# Patient Record
Sex: Female | Born: 1950 | Race: White | Hispanic: No | Smoking: Never smoker
Health system: Southern US, Community
[De-identification: ages and names within clinical notes are randomized; demographics above are authoritative.]

## PROBLEM LIST (undated history)

## (undated) DIAGNOSIS — I10 Essential (primary) hypertension: Secondary | ICD-10-CM

## (undated) DIAGNOSIS — Z87898 Personal history of other specified conditions: Secondary | ICD-10-CM

## (undated) DIAGNOSIS — C482 Malignant neoplasm of peritoneum, unspecified: Secondary | ICD-10-CM

## (undated) DIAGNOSIS — G4730 Sleep apnea, unspecified: Secondary | ICD-10-CM

## (undated) DIAGNOSIS — B9629 Other Escherichia coli [E. coli] as the cause of diseases classified elsewhere: Secondary | ICD-10-CM

## (undated) DIAGNOSIS — M1990 Unspecified osteoarthritis, unspecified site: Secondary | ICD-10-CM

## (undated) DIAGNOSIS — M5090 Cervical disc disorder, unspecified, unspecified cervical region: Secondary | ICD-10-CM

## (undated) DIAGNOSIS — K219 Gastro-esophageal reflux disease without esophagitis: Secondary | ICD-10-CM

## (undated) DIAGNOSIS — K449 Diaphragmatic hernia without obstruction or gangrene: Secondary | ICD-10-CM

## (undated) DIAGNOSIS — E7800 Pure hypercholesterolemia, unspecified: Secondary | ICD-10-CM

## (undated) DIAGNOSIS — C801 Malignant (primary) neoplasm, unspecified: Secondary | ICD-10-CM

## (undated) DIAGNOSIS — K760 Fatty (change of) liver, not elsewhere classified: Secondary | ICD-10-CM

## (undated) DIAGNOSIS — J45909 Unspecified asthma, uncomplicated: Secondary | ICD-10-CM

## (undated) DIAGNOSIS — M519 Unspecified thoracic, thoracolumbar and lumbosacral intervertebral disc disorder: Secondary | ICD-10-CM

## (undated) DIAGNOSIS — N390 Urinary tract infection, site not specified: Secondary | ICD-10-CM

## (undated) DIAGNOSIS — J189 Pneumonia, unspecified organism: Secondary | ICD-10-CM

## (undated) HISTORY — DX: Sleep apnea, unspecified: G47.30

## (undated) HISTORY — PX: BLADDER REPAIR: SHX76

## (undated) HISTORY — DX: Gastro-esophageal reflux disease without esophagitis: K21.9

## (undated) HISTORY — DX: Unspecified asthma, uncomplicated: J45.909

## (undated) HISTORY — PX: CARPAL TUNNEL RELEASE: SHX101

## (undated) HISTORY — DX: Other Escherichia coli (E. coli) as the cause of diseases classified elsewhere: B96.29

## (undated) HISTORY — PX: TOE SURGERY: SHX1073

## (undated) HISTORY — DX: Other Escherichia coli (E. coli) as the cause of diseases classified elsewhere: N39.0

## (undated) HISTORY — PX: REPAIR RECTOCELE: SUR1206

## (undated) HISTORY — PX: BREAST EXCISIONAL BIOPSY: SUR124

---

## 2012-06-21 HISTORY — PX: CATARACT EXTRACTION, BILATERAL: SHX1313

## 2014-07-30 ENCOUNTER — Other Ambulatory Visit: Payer: Self-pay

## 2014-07-30 DIAGNOSIS — Z1231 Encounter for screening mammogram for malignant neoplasm of breast: Secondary | ICD-10-CM

## 2014-07-30 DIAGNOSIS — Z803 Family history of malignant neoplasm of breast: Secondary | ICD-10-CM

## 2014-08-16 ENCOUNTER — Other Ambulatory Visit: Payer: Self-pay | Admitting: Family Medicine

## 2014-08-16 DIAGNOSIS — R200 Anesthesia of skin: Secondary | ICD-10-CM

## 2014-08-16 DIAGNOSIS — M542 Cervicalgia: Secondary | ICD-10-CM

## 2014-09-18 ENCOUNTER — Ambulatory Visit
Admission: RE | Admit: 2014-09-18 | Discharge: 2014-09-18 | Disposition: A | Discharge status: Home / Self Care | Payer: BLUE CROSS/BLUE SHIELD | Source: Ambulatory Visit | Attending: Family Medicine | Admitting: Family Medicine

## 2014-09-18 DIAGNOSIS — R200 Anesthesia of skin: Secondary | ICD-10-CM

## 2014-09-18 DIAGNOSIS — M542 Cervicalgia: Secondary | ICD-10-CM

## 2014-09-20 ENCOUNTER — Ambulatory Visit
Admission: RE | Admit: 2014-09-20 | Discharge: 2014-09-20 | Disposition: A | Discharge status: Home / Self Care | Payer: BLUE CROSS/BLUE SHIELD | Source: Ambulatory Visit | Attending: Family Medicine | Admitting: Family Medicine

## 2014-10-31 ENCOUNTER — Emergency Department (HOSPITAL_COMMUNITY): Payer: BLUE CROSS/BLUE SHIELD

## 2014-10-31 ENCOUNTER — Emergency Department (HOSPITAL_COMMUNITY)
Admission: EM | Admit: 2014-10-31 | Discharge: 2014-10-31 | Disposition: A | Discharge status: Home / Self Care | Payer: BLUE CROSS/BLUE SHIELD | Attending: Emergency Medicine | Admitting: Emergency Medicine

## 2014-10-31 ENCOUNTER — Encounter (HOSPITAL_COMMUNITY): Payer: Self-pay

## 2014-10-31 DIAGNOSIS — I10 Essential (primary) hypertension: Secondary | ICD-10-CM | POA: Insufficient documentation

## 2014-10-31 DIAGNOSIS — Z9981 Dependence on supplemental oxygen: Secondary | ICD-10-CM | POA: Insufficient documentation

## 2014-10-31 DIAGNOSIS — Z79899 Other long term (current) drug therapy: Secondary | ICD-10-CM | POA: Diagnosis not present

## 2014-10-31 DIAGNOSIS — Z7982 Long term (current) use of aspirin: Secondary | ICD-10-CM | POA: Insufficient documentation

## 2014-10-31 DIAGNOSIS — M549 Dorsalgia, unspecified: Secondary | ICD-10-CM | POA: Diagnosis not present

## 2014-10-31 DIAGNOSIS — R509 Fever, unspecified: Secondary | ICD-10-CM | POA: Insufficient documentation

## 2014-10-31 DIAGNOSIS — Z791 Long term (current) use of non-steroidal anti-inflammatories (NSAID): Secondary | ICD-10-CM | POA: Diagnosis not present

## 2014-10-31 DIAGNOSIS — R109 Unspecified abdominal pain: Secondary | ICD-10-CM

## 2014-10-31 DIAGNOSIS — E780 Pure hypercholesterolemia: Secondary | ICD-10-CM | POA: Insufficient documentation

## 2014-10-31 HISTORY — DX: Pure hypercholesterolemia, unspecified: E78.00

## 2014-10-31 HISTORY — DX: Essential (primary) hypertension: I10

## 2014-10-31 LAB — COMPREHENSIVE METABOLIC PANEL
ALT: 31 U/L (ref 14–54)
AST: 36 U/L (ref 15–41)
Albumin: 3.9 g/dL (ref 3.5–5.0)
Alkaline Phosphatase: 65 U/L (ref 38–126)
Anion gap: 10 (ref 5–15)
BUN: 11 mg/dL (ref 6–20)
CO2: 29 mmol/L (ref 22–32)
Calcium: 8.8 mg/dL — ABNORMAL LOW (ref 8.9–10.3)
Chloride: 97 mmol/L — ABNORMAL LOW (ref 101–111)
Creatinine, Ser: 0.84 mg/dL (ref 0.44–1.00)
GFR calc Af Amer: >60 mL/min (ref >60)
GFR calc non Af Amer: >60 mL/min (ref >60)
Glucose, Bld: 123 mg/dL — ABNORMAL HIGH (ref 65–99)
Potassium: 3.9 mmol/L (ref 3.5–5.1)
Sodium: 136 mmol/L (ref 135–145)
Total Bilirubin: 1 mg/dL (ref 0.3–1.2)
Total Protein: 7.5 g/dL (ref 6.5–8.1)

## 2014-10-31 LAB — URINALYSIS, ROUTINE W REFLEX MICROSCOPIC
Bilirubin Urine: NEGATIVE
Glucose, UA: NEGATIVE mg/dL
Ketones, ur: 15 mg/dL — AB
Nitrite: NEGATIVE
Protein, ur: NEGATIVE mg/dL
Specific Gravity, Urine: 1.021 (ref 1.005–1.030)
Urobilinogen, UA: 1 mg/dL (ref 0.0–1.0)
pH: 7 (ref 5.0–8.0)

## 2014-10-31 LAB — CBC WITH DIFFERENTIAL/PLATELET
Basophils Absolute: 0 10*3/uL (ref 0.0–0.1)
Basophils Relative: 0 % (ref 0–1)
Eosinophils Absolute: 0.3 10*3/uL (ref 0.0–0.7)
Eosinophils Relative: 3 % (ref 0–5)
HCT: 42.9 % (ref 36.0–46.0)
Hemoglobin: 14 g/dL (ref 12.0–15.0)
Lymphocytes Relative: 4 % — ABNORMAL LOW (ref 12–46)
Lymphs Abs: 0.5 10*3/uL — ABNORMAL LOW (ref 0.7–4.0)
MCH: 29.4 pg (ref 26.0–34.0)
MCHC: 32.6 g/dL (ref 30.0–36.0)
MCV: 90.1 fL (ref 78.0–100.0)
Monocytes Absolute: 0.8 10*3/uL (ref 0.1–1.0)
Monocytes Relative: 7 % (ref 3–12)
Neutro Abs: 10.4 10*3/uL — ABNORMAL HIGH (ref 1.7–7.7)
Neutrophils Relative %: 86 % — ABNORMAL HIGH (ref 43–77)
Platelets: 206 10*3/uL (ref 150–400)
RBC: 4.76 MIL/uL (ref 3.87–5.11)
RDW: 13.6 % (ref 11.5–15.5)
WBC: 12.1 10*3/uL — ABNORMAL HIGH (ref 4.0–10.5)

## 2014-10-31 LAB — URINE MICROSCOPIC-ADD ON

## 2014-10-31 LAB — GRAM STAIN

## 2014-10-31 LAB — I-STAT CG4 LACTIC ACID, ED: Lactic Acid, Venous: 1.83 mmol/L (ref 0.5–2.0)

## 2014-10-31 MED ORDER — CEPHALEXIN 500 MG PO CAPS: 500.0000 mg | ORAL_CAPSULE | Freq: Four times a day (QID) | ORAL | Status: AC

## 2014-10-31 MED ORDER — ONDANSETRON HCL 4 MG PO TABS: 4.0000 mg | ORAL_TABLET | Freq: Four times a day (QID) | ORAL | Status: DC

## 2014-10-31 MED ORDER — SODIUM CHLORIDE 0.9 % IV BOLUS (SEPSIS)
1000.0000 mL | Freq: Once | INTRAVENOUS | Status: AC
  Administered 2014-10-31: 1000 mL via INTRAVENOUS

## 2014-10-31 MED ORDER — ONDANSETRON HCL 4 MG/2ML IJ SOLN
4.0000 mg | Freq: Once | INTRAMUSCULAR | Status: AC
  Administered 2014-10-31: 4 mg via INTRAVENOUS
  Filled 2014-10-31: qty 2

## 2014-10-31 MED ORDER — ACETAMINOPHEN 325 MG PO TABS
650.0000 mg | ORAL_TABLET | Freq: Four times a day (QID) | ORAL | Status: DC | PRN
  Administered 2014-10-31: 650 mg via ORAL

## 2014-10-31 MED ORDER — ACETAMINOPHEN 325 MG PO TABS
ORAL_TABLET | ORAL | Status: AC
  Filled 2014-10-31: qty 2

## 2014-10-31 MED ORDER — HYDROCODONE-ACETAMINOPHEN 5-325 MG PO TABS: 2.0000 | ORAL_TABLET | ORAL | Status: DC | PRN

## 2014-10-31 NOTE — ED Notes (Signed)
Pt reports she was diagnosed with uti last Thursday and had cortisone injections in her neck a week from Tuesday. Today she went to physical therapy and was found to have a fever so they sent her home. PCP did urinalysis today at pcp and was told urine looked better. Pt c/o pain above her cortisone injections site and pain mid back as well. Denies painful urination or blood in urine that she has noticed. Pt alert, oriented, nad.

## 2014-10-31 NOTE — ED Notes (Signed)
Spoke with xray, they will bring pt to room once they are completed with xray.

## 2014-10-31 NOTE — ED Notes (Signed)
Pt sent from [facility] MD for further eval to get ct scan. Has been on antibiotics for a week with macrobid and ecoli in urine. Having upper back pain. MD wants evaluated for spinal abscess vs pyelo.

## 2014-10-31 NOTE — ED Notes (Signed)
Patient transported to CT 

## 2014-10-31 NOTE — ED Provider Notes (Signed)
CSN: [id number]     Arrival date & time 10/31/14  1517 History   First MD Initiated Contact with Patient 10/31/14 1610     Chief Complaint  Patient presents with  . Fever  . Urinary Tract Infection     (Consider location/radiation/quality/duration/timing/severity/associated sxs/prior Treatment) HPI Comments: 64 year old female presents from PCP office with concern for fever. Patient was recently diagnosed with UTI approximately 1 week ago. He has been on oral Macrobid for 1 week. Had been improving until today. Today went to physical therapy where she was noted to have fever up to 101. Went home and rested however fever continued to climb now 102. Patient complaining of bilateral CVA pain. Patient also recently had a spinal injection at approximately T2. This was approximately one week ago. She is also complaining of some increased pain in that area as well.  Patient is a 64 y.o. female presenting with fever and urinary tract infection.  Fever Max temp prior to arrival:  102 Temp source:  Oral Severity:  Moderate Onset quality:  Gradual Timing:  Constant Progression:  Worsening Chronicity:  New Associated symptoms: no chest pain, no confusion, no congestion, no headaches and no rash   Associated symptoms comment:  Back pain Risk factors: recent surgery   Urinary Tract Infection Associated symptoms include a fever. Pertinent negatives include no abdominal pain, chest pain, congestion, headaches or rash.    Past Medical History  Diagnosis Date  . Hypertension   . Hypercholesteremia    Past Surgical History  Procedure Laterality Date  . Carpal tunnel release Right   . Repair rectocele    . Bladder repair    . Cataract extraction, bilateral  2014  . Breast lumpectomy Left    No family history on file. History  Substance Use Topics  . Smoking status: Never Smoker   . Smokeless tobacco: Not on file  . Alcohol Use: No   OB History    No data available     Review of  Systems  Constitutional: Positive for fever.  HENT: Negative for congestion.   Respiratory: Negative for shortness of breath.   Cardiovascular: Negative for chest pain.  Gastrointestinal: Negative for abdominal pain.  Genitourinary: Positive for flank pain.  Musculoskeletal: Positive for back pain.  Skin: Negative for rash.  Neurological: Negative for headaches.  Psychiatric/Behavioral: Negative for confusion.  All other systems reviewed and are negative.     Allergies  Codeine  Home Medications   Prior to Admission medications   Medication Sig Start Date End Date Taking? Authorizing Provider  aspirin EC 81 MG tablet Take 81 mg by mouth daily.   Yes Historical Provider, MD  Biotin 5000 MCG TABS Take 5,000 mcg by mouth daily.   Yes Historical Provider, MD  Coenzyme Q10 (CO Q 10 PO) Take 1 tablet by mouth daily.   Yes Historical Provider, MD  ferrous sulfate 325 (65 FE) MG tablet Take 325 mg by mouth daily with breakfast.   Yes Historical Provider, MD  Flaxseed, Linseed, (FLAX SEED OIL) 1000 MG CAPS Take 1,000 mg by mouth daily.   Yes Historical Provider, MD  lisinopril-hydrochlorothiazide (PRINZIDE,ZESTORETIC) 20-25 MG per tablet Take 1 tablet by mouth daily.   Yes Historical Provider, MD  lovastatin (MEVACOR) 20 MG tablet Take 20 mg by mouth at bedtime.   Yes Historical Provider, MD  meloxicam (MOBIC) 15 MG tablet Take 15 mg by mouth daily.   Yes Historical Provider, MD  methocarbamol (ROBAXIN) 500 MG tablet Take 500 mg  by mouth every 8 (eight) hours as needed for muscle spasms.   Yes Historical Provider, MD  Multiple Vitamins-Minerals (MULTIVITAMIN WITH MINERALS) tablet Take 1 tablet by mouth daily.   Yes Historical Provider, MD  nitrofurantoin, macrocrystal-monohydrate, (MACROBID) 100 MG capsule Take 100 mg by mouth 2 (two) times daily.   Yes Historical Provider, MD  Omega-3 Fatty Acids (FISH OIL) 1000 MG CAPS Take 1,000 mg by mouth daily.   Yes Historical Provider, MD  vitamin E  400 UNIT capsule Take 400 Units by mouth daily.   Yes Historical Provider, MD  cholecalciferol (VITAMIN D) 1000 UNITS tablet Take 1,000 Units by mouth daily.    Historical Provider, MD   BP 120/50 mmHg  Pulse 88  Temp(Src) 100.2 F (37.9 C) (Oral)  Resp 23  Ht 5\' 5"  (1.651 m)  Wt 259 lb 12.8 oz (117.845 kg)  BMI 43.23 kg/m2  SpO2 97% Physical Exam  Constitutional: She is oriented to person, place, and time. She appears well-developed.  HENT:  Head: Normocephalic.  Eyes: Pupils are equal, round, and reactive to light.  Neck: Normal range of motion.  Mild tenderness to palpation approximately level of T1-T2. No erythema areas of fluctuance etc.  Cardiovascular: Normal rate and intact distal pulses.   Pulmonary/Chest: Effort normal. No respiratory distress.  Abdominal: She exhibits no distension.  Genitourinary:  Denies any bowel or bladder incontinence. Deferred exam at this time  Musculoskeletal:  Pain and bilateral CVAs as well as midline  Neurological: She is alert and oriented to person, place, and time. She exhibits normal muscle tone.  Skin: Skin is warm. No rash noted. No erythema.  Psychiatric: She has a normal mood and affect.  Vitals reviewed.   ED Course  Procedures (including critical care time) Labs Review Labs Reviewed  CBC WITH DIFFERENTIAL/PLATELET - Abnormal; Notable for the following:    WBC 12.1 (*)    Neutrophils Relative % 86 (*)    Neutro Abs 10.4 (*)    Lymphocytes Relative 4 (*)    Lymphs Abs 0.5 (*)    All other components within normal limits  COMPREHENSIVE METABOLIC PANEL - Abnormal; Notable for the following:    Chloride 97 (*)    Glucose, Bld 123 (*)    Calcium 8.8 (*)    All other components within normal limits  URINALYSIS, ROUTINE W REFLEX MICROSCOPIC - Abnormal; Notable for the following:    Color, Urine AMBER (*)    Hgb urine dipstick MODERATE (*)    Ketones, ur 15 (*)    Leukocytes, UA TRACE (*)    All other components within  normal limits  URINE MICROSCOPIC-ADD ON - Abnormal; Notable for the following:    Squamous Epithelial / LPF FEW (*)    All other components within normal limits  GRAM STAIN  CULTURE, BLOOD (ROUTINE X 2)  CULTURE, BLOOD (ROUTINE X 2)  URINE CULTURE  I-STAT CG4 LACTIC ACID, ED    Imaging Review Dg Chest 2 View  10/31/2014   CLINICAL DATA:  Fever and cough  EXAM: CHEST  2 VIEW  COMPARISON:  None.  FINDINGS: Heart size and vascularity are normal. Lung markings are prominent. These appear chronic however no prior studies available to confirm. Negative for pneumonia or effusion. No mass lesion.  IMPRESSION: No active cardiopulmonary disease.   Electronically Signed   By: Franchot Gallo M.D.   On: 10/31/2014 16:09   Ct Renal Stone Study  10/31/2014   CLINICAL DATA:  Upper back pain for  1 week. On antibiotics for urinary tract infection. Evaluate for spinal abscess versus pyelonephritis. Initial encounter.  EXAM: CT ABDOMEN AND PELVIS WITHOUT CONTRAST  TECHNIQUE: Multidetector CT imaging of the abdomen and pelvis was performed following the standard protocol without IV contrast.  COMPARISON:  Chest radiograph same date.  FINDINGS: Lower chest: Patchy right middle lobe airspace disease on images 6-8 without consolidation. The lung bases are otherwise clear. There is no significant pleural or pericardial effusion.  Hepatobiliary: The liver demonstrates decreased density consistent with steatosis. As evaluated in the noncontrast state, no focal abnormality identified. No evidence of gallstones, gallbladder wall thickening or biliary dilatation.  Pancreas: Unremarkable. No pancreatic ductal dilatation or surrounding inflammatory changes.  Spleen: Normal in size without focal abnormality.  Adrenals/Urinary Tract: Both adrenal glands appear normal.The kidneys appear normal without evidence of urinary tract calculus, suspicious lesion or hydronephrosis. There is no perinephric soft tissue stranding or fluid  collection. There are no signs of pyelonephritis on noncontrast imaging. No bladder abnormalities are seen.  Stomach/Bowel: No evidence of bowel wall thickening, distention or surrounding inflammatory change.The appendix appears normal. There is diverticulosis of the descending and sigmoid colon.  Vascular/Lymphatic: There are no enlarged abdominal or pelvic lymph nodes. Minimal atherosclerosis. No significant vascular findings demonstrated on noncontrast imaging.  Reproductive: Unremarkable.  Other: No evidence of abdominal wall mass or hernia.  Musculoskeletal: No acute or significant osseous findings. There is a mild convex right lumbar scoliosis with associated spondylosis. There is no evidence of discitis or paraspinal inflammation on noncontrast imaging. Mild osteitis pubis noted.  IMPRESSION: 1. No acute abdominal pelvic findings or explanation for the patient's symptoms. 2. No evidence of complicated urinary tract infection or paraspinal inflammation on noncontrast imaging. 3. Distal colonic diverticulosis without surrounding inflammation. 4. Hepatic steatosis. 5. Patchy right middle lobe airspace disease without consolidation. This could reflect minimal inflammation, not apparent on today's radiographs.   Electronically Signed   By: Richardean Sale M.D.   On: 10/31/2014 18:40     EKG Interpretation None      MDM  64 year old female presents from PCP clinic with fever. Patient is been treated for probably 5 days with Macrobid for UTI. Today along with fevers having worsening pain mainly over the CVA areas. On exam she is markedly tender palpation along these areas. Additionally she is febrile here to 102.2. Concerning this patient for possible untreated UTI versus pneumonia versus cellulitis. UA here has only trace leukocytes otherwise appears fairly clean. Again she has been on Macrobid for 5 days at his possible this is masking a partial untreated UTI. However given patient's amount of pain we  will obtain a CT abdomen and pelvis to rule out nephrolithiasis.. CT showed no findings for pain. Patient has no other infectious etiologies time. Chest x-ray is clear. She is satting well on room air initially. She did have episodes of desaturation in the emergency department however this was when she was sleeping. She does wear CPAP every night. Given chest x-ray thinks is a pneumonia. No skin findings consistent with cellulitis. Patient did have a swelling injection prostatectomy 10 days ago. This was level CII. On exam she has minimal tenderness palpations area. There is no overlying erythema crepitus or other findings consistent with a underlying infection. Additionally this appears to be out of the timeframe for development of infection. She has no neurological symptoms which indicate a spinal abscess. This time patient has a fever of unclear etiology. Possibly developing pyelonephritis. May also be a viral  infection. Plan will be to treat with Keflex for 10 days for pyelonephritis. Discontinue Macrobid. Follow-up with PCP in several days. We have discussed return precautions for development of any neurological symptoms, bowel or bladder incontinence which would indicate some spinal pathology. However at this time we do not think an MRI of the spine is indicated. Discussed this with the patient who is in full agreement with this plan. Also total patient returned department if she has any worsening of any symptoms.   Final diagnoses:  Flank pain        Robynn Pane, MD [id number]  Delora Fuel, MD 47/42/59 [id number]

## 2014-10-31 NOTE — ED Notes (Signed)
MD at bedside. 

## 2014-10-31 NOTE — ED Notes (Addendum)
Dr. Olene Floss advised patient was placed on 2L O2 because her sat dropped to 90% while sleeping. O2 Sat back to 96% at this time. Also talked with Dr. Olene Floss about concerns that patient's status did not seem to have improved since her arrival here.

## 2014-10-31 NOTE — ED Notes (Signed)
Pt ambulated to bathroom with no assistance.  

## 2014-11-01 LAB — URINE CULTURE
Colony Count: NO GROWTH
Culture: NO GROWTH

## 2014-11-06 LAB — CULTURE, BLOOD (ROUTINE X 2): Culture: NO GROWTH

## 2014-11-07 LAB — CULTURE, BLOOD (ROUTINE X 2): Culture: NO GROWTH

## 2014-12-17 ENCOUNTER — Other Ambulatory Visit: Payer: Self-pay | Admitting: Family Medicine

## 2014-12-17 DIAGNOSIS — Z853 Personal history of malignant neoplasm of breast: Secondary | ICD-10-CM

## 2014-12-31 ENCOUNTER — Ambulatory Visit
Admission: RE | Admit: 2014-12-31 | Discharge: 2014-12-31 | Disposition: A | Discharge status: Home / Self Care | Payer: BLUE CROSS/BLUE SHIELD | Source: Ambulatory Visit | Attending: Family Medicine | Admitting: Family Medicine

## 2014-12-31 DIAGNOSIS — Z853 Personal history of malignant neoplasm of breast: Secondary | ICD-10-CM

## 2015-08-19 ENCOUNTER — Encounter (INDEPENDENT_AMBULATORY_CARE_PROVIDER_SITE_OTHER): Payer: BLUE CROSS/BLUE SHIELD

## 2015-08-19 DIAGNOSIS — R002 Palpitations: Secondary | ICD-10-CM

## 2015-08-19 HISTORY — DX: Palpitations: R00.2

## 2015-11-24 ENCOUNTER — Other Ambulatory Visit: Payer: Self-pay | Admitting: Family Medicine

## 2015-11-24 DIAGNOSIS — Z1231 Encounter for screening mammogram for malignant neoplasm of breast: Secondary | ICD-10-CM

## 2015-11-26 ENCOUNTER — Ambulatory Visit
Admission: RE | Admit: 2015-11-26 | Discharge: 2015-11-26 | Disposition: A | Discharge status: Home / Self Care | Payer: BLUE CROSS/BLUE SHIELD | Source: Ambulatory Visit | Attending: Family Medicine | Admitting: Family Medicine

## 2015-11-26 DIAGNOSIS — Z1231 Encounter for screening mammogram for malignant neoplasm of breast: Secondary | ICD-10-CM

## 2016-02-19 ENCOUNTER — Ambulatory Visit: Payer: BLUE CROSS/BLUE SHIELD | Attending: Physician Assistant | Admitting: Physical Therapy

## 2016-02-19 DIAGNOSIS — M25662 Stiffness of left knee, not elsewhere classified: Secondary | ICD-10-CM

## 2016-02-19 DIAGNOSIS — M25562 Pain in left knee: Secondary | ICD-10-CM | POA: Insufficient documentation

## 2016-02-19 DIAGNOSIS — R262 Difficulty in walking, not elsewhere classified: Secondary | ICD-10-CM | POA: Diagnosis present

## 2016-02-19 DIAGNOSIS — M5442 Lumbago with sciatica, left side: Secondary | ICD-10-CM

## 2016-02-19 NOTE — Therapy (Signed)
[facility] [address]  [place], [place], [address] Phone: [phone]   Fax:  [phone]  Physical Therapy Evaluation  Patient Details  Name: Brittany Middleton MRN: [id number] Date of Birth: 09/04/1950 Referring Provider: Baldo Ash, PA-C  Encounter Date: 02/19/2016      PT End of Session - 02/19/16 1112    Visit Number 1   Number of Visits 16   Date for PT Re-Evaluation 04/16/16   PT Start Time 1012   PT Stop Time 1119   PT Time Calculation (min) 67 min   Activity Tolerance Patient tolerated treatment well;Patient limited by pain   Behavior During Therapy [facility] for tasks assessed/performed      Past Medical History:  Diagnosis Date  . Hypercholesteremia   . Hypertension     Past Surgical History:  Procedure Laterality Date  . BLADDER REPAIR    . BREAST LUMPECTOMY Left   . CARPAL TUNNEL RELEASE Right   . CATARACT EXTRACTION, BILATERAL  2014  . REPAIR RECTOCELE      There were no vitals filed for this visit.       Subjective Assessment - 02/19/16 1015    Subjective Pt reports she is normally pretty active with walking 1 mile/day, swimming and chair yoga. Try to start floor yoga a few weeks ago. Has since started noting increased pain and cramps in back of L LE. Has had sciatica in the past but pain pattern was more lateral and currently pain is more down the posterior leg.   Limitations Sitting;Standing   How long can you sit comfortably? <5 minutes   How long can you stand comfortably? <5 minutes   Diagnostic tests X-rays taken today at MD office reveal mod knee OA   Patient Stated Goals "To help alleviate the discomfort"   Currently in Pain? Yes   Pain Score 5    Pain Location Back  & L buttock   Pain Orientation Left;Right   Pain Descriptors / Indicators Constant;Penetrating;Sharp;Radiating   Pain Type Acute pain;Chronic pain   Pain Radiating Towards L LE radicular pain  to upper calf -  9.5/10 currently; Least 0/10, Avg 6.5-7/10, Worst 12/10   Pain Onset 1 to 4 weeks ago   Pain Frequency Intermittent   Aggravating Factors  sitting & static standing, lying on either side, getting in/out vehicle   Pain Relieving Factors moving, ice/heat, OTC meds   Effect of Pain on Daily Activities interferes with sleep, walking w/ stooped posture, constantly needing to move to relieve pain   Multiple Pain Sites Yes   Pain Score 5  Least 0/10, Avg 8/10, Worst 10/10   Pain Location Knee   Pain Orientation Left;Posterior   Pain Descriptors / Indicators Constant;Penetrating;Sharp;Spasm;Tightness  Screaming   Pain Type Acute pain   Pain Radiating Towards n/a   Pain Onset In the past 7 days   Pain Frequency Intermittent   Aggravating Factors  bending or straightening knee   Pain Relieving Factors knee straight with soft support/elevation            [facility] PT Assessment - 02/19/16 1015      Assessment   Medical Diagnosis LBP with L LE sciatica & L knee pain   Referring Provider Baldo Ash, PA-C   Onset Date/Surgical Date --  2 weeks   Next MD Visit 03/08/16   Prior Therapy none     Balance Screen   Has the patient fallen in the past  6 months Yes   How many times? 2   Has the patient had a decrease in activity level because of a fear of falling?  No   Is the patient reluctant to leave their home because of a fear of falling?  No     Home Environment   Living Environment Private residence   Type of [place] Access Level entry   Home Layout One level     Prior Function   Level of Independence Independent   Vocation Retired   Investment banker, corporate, work-out daily at [employer] (walking, swimming, yoga)     Observation/Other Assessments   Focus on Therapeutic Outcomes (FOTO)  Lumbar Spine - 30% (70% limitation); predicted 62% (38% limitation)     ROM / Strength   AROM / PROM / Strength AROM;Strength     AROM   AROM Assessment Site Lumbar   Lumbar Flexion hands to mid  shins - increased pain   Lumbar Extension 30% - decreases pain   Lumbar - Right Side Bend hand to upper thigh - increased pain   Lumbar - Left Side Bend hand to upper thigh - increased pain   Lumbar - Right Rotation 50% - increases pain on L   Lumbar - Left Rotation WFL - no increased pain     Strength   Overall Strength Comments strength testing deferred due to elevated pain levels     Flexibility   Soft Tissue Assessment /Muscle Length yes   Hamstrings mildly tight on L   Quadriceps mildly tight on L in hip flexors/RF   ITB tight B   Piriformis moderately tight on L   Quadratus Lumborum increased muscle tension on L     Palpation   Palpation comment ttp over L paraspinals, L QL, L piriformis, & L greater trochanteric bursa     Ambulation/Gait   Gait Pattern Antalgic;Decreased weight shift to left;Trunk flexed;Lateral trunk lean to right         Today's Treatment  TherEx/Self-care Instruction in ITB & piriformis stretches for HEP  Modalities Pre-mod estim with intensity to pt tolerance x15' - Channel #1 to L lateral hip over trochanteric bursa, Channel #2 to L buttock over piriformis (pt in R sidelying with pillow btw knees) Moist Heat in conjunction with estim to L lateral hip/buttock x15'           PT Education - 02/19/16 1100    Education provided Yes   Education Details PT eval findings & POC; Use of Estim for pain mgt (pt has home TENS unit); ITB & piriformis stretches   Person(s) Educated Patient   Methods Explanation;Demonstration;Handout   Comprehension Verbalized understanding;Need further instruction          PT Short Term Goals - 02/19/16 1104      PT SHORT TERM GOAL #1   Title Complete LE assessment as pain levels allow & establish goals/POC as appropriate by 03/05/16   Status New     PT SHORT TERM GOAL #2   Title Independent with initial HEP by 03/05/16   Status New           PT Long Term Goals - 02/19/16 1104      PT LONG TERM GOAL  #1   Title Independent with advanced HEP/gym program as indicated by 04/16/16   Status New     PT LONG TERM GOAL #2   Title Lumbar ROM WFL w/o pain or radicular symptoms by 04/16/16   Status New  PT LONG TERM GOAL #3   Title Report overall reduction of low back/radicular LE and L knee pain >/= 75% for improved sleeping, sitting and standing tolerance by 04/16/16   Status New     PT LONG TERM GOAL #4   Title Ambulate with normal posture and gait pattern w/o limitation due to LBP or L knee pain >/= 3/10 by 04/16/16   Status New               Plan - 02/19/16 1104    Clinical Impression Statement Brittany Middleton is a 65 y/o female who presents to OP PT for a moderately complex eval for bilateral LBP with L LE sciatica and L knee pain which originated ~2 weeks ago after trying to progress from chair yoga to floor yoga at the [facility]. Pt states current LBP is across B low back and extends into L buttock, but also notes cramps and radicular pain down posterior L LE to calf.  LBP at time of eval was 5/10, but radicular pain was 9.5/10 with pt reporting least pain 0/10, average pain at 6.5-7/10 and worst up to 12/10 (typically with static sitting or standing for <5 minutes). L knee pain at time of eval was 5/10, with pt reporting least pain 0/10, average pain at 8/10 and worst up to 10/10 (typically with any attempts to bend or straighten knee). Pt's chief complaints include pain interfering with sleep, limiting static sitting and standing tolerance (currently <5 minutes) with feeling of need to constantly move to relieve pain, and need to walk with stooped posture due to pain. Assessment revealed lumbar ROM limited motion in all planes due to pain except L rotation WFL with no increased pain. LE assessment limited due to severe L knee pain, but flexibility assessment reveals mild to moderate lumbar and proximal LE tightness/increased muscle tension in B ITB and L paraspinals, QL, piriformis & L hip  flexors/RF. LE strength assessment deferred today due to high pain levels. Pt demonstrates significant ttp over L paraspinals, L QL, L piriformis, & L greater trochanteric bursa. POC will focus on improving LE soft tissue pliability with stretching and manual therapy including possible TDN, core/proximal stability training, postural training with emphasis on neutral spine alignment, LE strengthening and manual therapy/modalities PRN for pain. May consider mechanical traction as appropriate and ionto if approved by MD.   Rehab Potential Good   PT Frequency 2x / week   PT Duration 8 weeks   PT Treatment/Interventions Patient/family education;Therapeutic exercise;Neuromuscular re-education;Manual techniques;Dry needling;Taping;Electrical Stimulation;Moist Heat;Ultrasound;Cryotherapy;Vasopneumatic Device;Iontophoresis 4mg /ml Dexamethasone;ADLs/Self Care Home Management   PT Next Visit Plan Assess reponse to estim; Complete LE assessment including ROM & MMT with particular focus on L knee; Review initial HEP stretches and progress HEP as tolerated; LE flexibility; Lumbar stabilization & LE strengthening; Manual therapy inlcuding TDN as indicated; Modailities PRN for pain inlcuding ionto patch if approved by MD   Consulted and Agree with Plan of Care Patient      Patient will benefit from skilled therapeutic intervention in order to improve the following deficits and impairments:  Pain, Impaired flexibility, Decreased range of motion, Decreased strength, Increased muscle spasms, Decreased activity tolerance, Difficulty walking, Abnormal gait  Visit Diagnosis: Bilateral low back pain with left-sided sciatica  Pain in left knee  Stiffness of left knee, not elsewhere classified  Difficulty in walking, not elsewhere classified     Problem List Patient Active Problem List   Diagnosis Date Noted  . Palpitations 08/19/2015    Georgian Co  Luis Abed, PT, MPT 02/19/2016, 7:51 PM  [facility] [address]  [place], [place], [address] Phone: [phone]   Fax:  [phone]  Name: LEYLANY HEVEY MRN: [id number] Date of Birth: 09/18/1950

## 2016-02-26 ENCOUNTER — Encounter: Payer: Self-pay | Admitting: Rehabilitative and Restorative Service Providers"

## 2016-02-26 ENCOUNTER — Ambulatory Visit
Payer: BLUE CROSS/BLUE SHIELD | Attending: Physician Assistant | Admitting: Rehabilitative and Restorative Service Providers"

## 2016-02-26 DIAGNOSIS — R262 Difficulty in walking, not elsewhere classified: Secondary | ICD-10-CM | POA: Insufficient documentation

## 2016-02-26 DIAGNOSIS — M25562 Pain in left knee: Secondary | ICD-10-CM | POA: Diagnosis present

## 2016-02-26 DIAGNOSIS — M25662 Stiffness of left knee, not elsewhere classified: Secondary | ICD-10-CM | POA: Insufficient documentation

## 2016-02-26 DIAGNOSIS — M5442 Lumbago with sciatica, left side: Secondary | ICD-10-CM | POA: Insufficient documentation

## 2016-02-26 NOTE — Patient Instructions (Signed)

## 2016-02-26 NOTE — Therapy (Signed)
[facility] [address]  [place], [place], [address] Phone: [phone]   Fax:  [phone]  Physical Therapy Treatment  Patient Details  Name: Brittany Middleton MRN: [id number] Date of Birth: 01/04/1951 Referring Provider: Baldo Ash, PA-C  Encounter Date: 02/26/2016      PT End of Session - 02/26/16 0856    Visit Number 2   Number of Visits 16   Date for PT Re-Evaluation 04/16/16   PT Start Time [id number]   PT Stop Time 0943   PT Time Calculation (min) 56 min   Activity Tolerance Patient tolerated treatment well      Past Medical History:  Diagnosis Date  . Hypercholesteremia   . Hypertension     Past Surgical History:  Procedure Laterality Date  . BLADDER REPAIR    . BREAST LUMPECTOMY Left   . CARPAL TUNNEL RELEASE Right   . CATARACT EXTRACTION, BILATERAL  2014  . REPAIR RECTOCELE      There were no vitals filed for this visit.      Subjective Assessment - 02/26/16 0853    Subjective Very good response following initial visit - then sat for 4 hours on and off up every 20 min while working on a power point presentation. Things are flared up again. She took oxycodone last night to address the pain and be able to sleep. Pain level yeasterday was "20/10"    Currently in Pain? Yes   Pain Score 9    Pain Orientation Left;Right   Pain Descriptors / Indicators Constant;Penetrating;Sharp;Radiating   Pain Type Acute pain   Pain Onset 1 to 4 weeks ago   Pain Frequency Intermittent   Pain Score 5   Pain Location Knee   Pain Orientation Proximal;Left   Pain Descriptors / Indicators Constant;Penetrating;Sharp;Spasm;Tightness   Pain Type Acute pain                         [facility] Adult PT Treatment/Exercise - 02/26/16 0001      Ambulation/Gait   Gait Pattern Antalgic;Decreased weight shift to left;Trunk flexed;Lateral trunk lean to right     Exercises   Exercises --  PT assisted hip  rotation with knee flexed in prone x 5      Moist Heat Therapy   Number Minutes Moist Heat 15 Minutes   Moist Heat Location Lumbar Spine;Hip  Lt     Acupuncturist Location Lt lumbar spine to Lt piriformis area    Electrical Stimulation Action IFC   Electrical Stimulation Parameters to tolerance   Electrical Stimulation Goals Pain;Tone     Manual Therapy   Manual therapy comments patient prone    Soft tissue mobilization piriformis; hip abductors through the posterior greater trochanter; posterior thigh    Myofascial Release Lt posterior hip           Trigger Point Dry Needling - 02/26/16 0933    Consent Given? Yes   Education Handout Provided Yes   Gluteus Maximus Response Palpable increased muscle length   Gluteus Minimus Response Palpable increased muscle length   Piriformis Response Palpable increased muscle length              PT Education - 02/26/16 0858    Education provided Yes   Education Details TDN    Person(s) Educated Patient   Methods Explanation   Comprehension Verbalized understanding  PT Short Term Goals - 02/26/16 0858      PT SHORT TERM GOAL #1   Title Complete LE assessment as pain levels allow & establish goals/POC as appropriate by 03/05/16   Status On-going     PT SHORT TERM GOAL #2   Title Independent with initial HEP by 03/05/16   Status On-going           PT Long Term Goals - 02/26/16 0858      PT LONG TERM GOAL #1   Title Independent with advanced HEP/gym program as indicated by 04/16/16   Status On-going     PT LONG TERM GOAL #2   Title Lumbar ROM WFL w/o pain or radicular symptoms by 04/16/16   Status On-going     PT LONG TERM GOAL #3   Title Report overall reduction of low back/radicular LE and L knee pain >/= 75% for improved sleeping, sitting and standing tolerance by 04/16/16   Status On-going     PT LONG TERM GOAL #4   Title Ambulate with normal posture and gait  pattern w/o limitation due to LBP or L knee pain >/= 3/10 by 04/16/16   Status On-going               Plan - 02/26/16 0859    Clinical Impression Statement Patient responded well to initial intervention but reports significant flare up following prolonged sitting. She has pain in the Lt hip and posterior thigh to calf with antalgic gait. Patient tolerated TDN well with good release of muscular tightness through tthe Lt piriformis and hip abductors. A "flush of toxic chemicals" felt into the Lt LE per pt report.    Rehab Potential Good   PT Frequency 2x / week   PT Duration 8 weeks   PT Treatment/Interventions Patient/family education;Therapeutic exercise;Neuromuscular re-education;Manual techniques;Dry needling;Taping;Electrical Stimulation;Moist Heat;Ultrasound;Cryotherapy;Vasopneumatic Device;Iontophoresis 4mg /ml Dexamethasone;ADLs/Self Care Home Management   PT Next Visit Plan Assess reponse to TDN; Complete LE assessment including ROM & MMT with particular focus on L knee; Review initial HEP stretches and progress HEP as tolerated; LE flexibility; Lumbar stabilization & LE strengthening; Manual therapy inlcuding TDN as indicated; Modailities PRN for pain inlcuding ionto patch if approved by MD   Consulted and Agree with Plan of Care Patient      Patient will benefit from skilled therapeutic intervention in order to improve the following deficits and impairments:  Pain, Impaired flexibility, Decreased range of motion, Decreased strength, Increased muscle spasms, Decreased activity tolerance, Difficulty walking, Abnormal gait  Visit Diagnosis: Bilateral low back pain with left-sided sciatica  Pain in left knee  Stiffness of left knee, not elsewhere classified  Difficulty in walking, not elsewhere classified     Problem List Patient Active Problem List   Diagnosis Date Noted  . Palpitations 08/19/2015    Kalena Mander Nilda Simmer PT, MPH  02/26/2016, 9:37 AM  [facility] [address]  [place], [place], [address] Phone: [phone]   Fax:  [phone]  Name: Brittany Middleton MRN: [id number] Date of Birth: [birth year]

## 2016-03-02 ENCOUNTER — Ambulatory Visit: Payer: BLUE CROSS/BLUE SHIELD | Admitting: Physical Therapy

## 2016-03-02 DIAGNOSIS — M25562 Pain in left knee: Secondary | ICD-10-CM

## 2016-03-02 DIAGNOSIS — M5442 Lumbago with sciatica, left side: Secondary | ICD-10-CM | POA: Diagnosis not present

## 2016-03-02 DIAGNOSIS — R262 Difficulty in walking, not elsewhere classified: Secondary | ICD-10-CM

## 2016-03-02 DIAGNOSIS — M25662 Stiffness of left knee, not elsewhere classified: Secondary | ICD-10-CM

## 2016-03-02 NOTE — Therapy (Signed)
[facility] [address]  [place], [place], [address] Phone: [phone]   Fax:  [phone]  Physical Therapy Treatment  Patient Details  Name: Brittany Middleton MRN: [id number] Date of Birth: [birth year] Referring Provider: Baldo Ash, PA-C  Encounter Date: 03/02/2016      PT End of Session - 03/02/16 0848    Visit Number 3   Number of Visits 16   Date for PT Re-Evaluation 04/16/16   PT Start Time 0848   PT Stop Time 0954   PT Time Calculation (min) 66 min   Activity Tolerance Patient tolerated treatment well   Behavior During Therapy [facility] for tasks assessed/performed      Past Medical History:  Diagnosis Date  . Hypercholesteremia   . Hypertension     Past Surgical History:  Procedure Laterality Date  . BLADDER REPAIR    . BREAST LUMPECTOMY Left   . CARPAL TUNNEL RELEASE Right   . CATARACT EXTRACTION, BILATERAL  2014  . REPAIR RECTOCELE      There were no vitals filed for this visit.      Subjective Assessment - 03/02/16 0848    Subjective Pt reports TDN helped significantly and has continued to use TENS and heating pad at home. Pt noting pain and tightness in L leg still causing her to walk slowly.   Patient Stated Goals "To help alleviate the discomfort"   Currently in Pain? Yes   Pain Score 4    Pain Location Hip   Pain Orientation Left   Pain Score --  4-5/10   Pain Location Leg   Pain Orientation Left;Posterior            [facility] PT Assessment - 03/02/16 0848      ROM / Strength   AROM / PROM / Strength AROM;Strength     AROM   AROM Assessment Site Knee   Right/Left Knee Left;Right   Right Knee Extension 0   Right Knee Flexion 130   Left Knee Extension 6   Left Knee Flexion 111     Strength   Strength Assessment Site Hip;Knee   Right/Left Hip Left;Right   Right Hip Flexion 4+/5   Right Hip Extension 4-/5   Right Hip ABduction 4+/5   Right Hip ADduction 3+/5   Left  Hip Flexion 4-/5   Left Hip Extension 4-/5   Left Hip ABduction 4-/5   Left Hip ADduction 3/5   Right/Left Knee Left;Right   Right Knee Flexion 4+/5   Right Knee Extension 4+/5   Left Knee Flexion 4/5   Left Knee Extension 4/5           Today's Treatment  TherEx Abdominal bracing/Pelvic tilt 10x5" Hip ADD Ball Squeeze isometric 10x5" Bridge + Hip ABD isometric with green TB 10x5" Hooklying Hip ABD/ER with green TB 10x3" TrA + Hooklying LE March with green TB 10x3" HS + Gastroc stretch with strap 2x30" ITB Stretch with strap 2x30"  Modalities IFC (80-150 Hz) estim to L low back & piriformis estim with intensity to pt tolerance x15'  Moist Heat in conjunction with estim to L lateral hip/buttock x15'          PT Education - 03/02/16 0930    Education provided Yes   Education Details Initial strengthening HEP   Person(s) Educated Patient   Methods Explanation;Demonstration;Handout   Comprehension Verbalized understanding;Returned demonstration;Need further instruction          PT Short Term  Goals - 02/26/16 0858      PT SHORT TERM GOAL #1   Title Complete LE assessment as pain levels allow & establish goals/POC as appropriate by 03/05/16   Status On-going     PT SHORT TERM GOAL #2   Title Independent with initial HEP by 03/05/16   Status On-going           PT Long Term Goals - 02/26/16 0858      PT LONG TERM GOAL #1   Title Independent with advanced HEP/gym program as indicated by 04/16/16   Status On-going     PT LONG TERM GOAL #2   Title Lumbar ROM WFL w/o pain or radicular symptoms by 04/16/16   Status On-going     PT LONG TERM GOAL #3   Title Report overall reduction of low back/radicular LE and L knee pain >/= 75% for improved sleeping, sitting and standing tolerance by 04/16/16   Status On-going     PT LONG TERM GOAL #4   Title Ambulate with normal posture and gait pattern w/o limitation due to LBP or L knee pain >/= 3/10 by 04/16/16    Status On-going               Plan - 03/02/16 0938    Clinical Impression Statement Pt noting positive response to TDN with overall pain less today but still noting posterior thigh/knee pain on L. Completed LE assessment with decreased knee ROM noted on L and mild to moderate weakness in B hip and knee, L > R with greatest weakness in hip adduction & extension. Created in initial lumbar stabilization/LE strengthening HEP and will assess response within next few visits, with focus of next visit to continue with TDN as indicated.   Rehab Potential Good   PT Frequency 2x / week   PT Duration 8 weeks   PT Treatment/Interventions Patient/family education;Therapeutic exercise;Neuromuscular re-education;Manual techniques;Dry needling;Taping;Electrical Stimulation;Moist Heat;Ultrasound;Cryotherapy;Vasopneumatic Device;Iontophoresis 4mg /ml Dexamethasone;ADLs/Self Care Home Management   PT Next Visit Plan Review updated HEP and progress HEP as tolerated; LE flexibility; Lumbar stabilization & LE strengthening; Manual therapy including TDN as indicated; Modailities PRN for pain inlcuding ionto patch if approved by MD   Consulted and Agree with Plan of Care Patient      Patient will benefit from skilled therapeutic intervention in order to improve the following deficits and impairments:  Pain, Impaired flexibility, Decreased range of motion, Decreased strength, Increased muscle spasms, Decreased activity tolerance, Difficulty walking, Abnormal gait  Visit Diagnosis: Bilateral low back pain with left-sided sciatica  Pain in left knee  Stiffness of left knee, not elsewhere classified  Difficulty in walking, not elsewhere classified     Problem List Patient Active Problem List   Diagnosis Date Noted  . Palpitations 08/19/2015    Percival Spanish, PT, MPT 03/02/2016, 1:53 PM  [facility] [address]  [place], [place],  [address] Phone: [phone]   Fax:  [phone]  Name: Brittany Middleton MRN: [id number] Date of Birth: 1950/08/07

## 2016-03-04 ENCOUNTER — Ambulatory Visit: Payer: BLUE CROSS/BLUE SHIELD | Admitting: Rehabilitative and Restorative Service Providers"

## 2016-03-04 DIAGNOSIS — M5442 Lumbago with sciatica, left side: Secondary | ICD-10-CM

## 2016-03-04 DIAGNOSIS — M25662 Stiffness of left knee, not elsewhere classified: Secondary | ICD-10-CM

## 2016-03-04 DIAGNOSIS — M25562 Pain in left knee: Secondary | ICD-10-CM

## 2016-03-04 DIAGNOSIS — R262 Difficulty in walking, not elsewhere classified: Secondary | ICD-10-CM

## 2016-03-04 NOTE — Therapy (Addendum)
[facility] [address]  [place], [place], [address] Phone: [phone]   Fax:  [phone]  Physical Therapy Treatment  Patient Details  Name: Brittany Middleton MRN: [id number] Date of Birth: Sep 20, 1950 Referring Provider: Baldo Ash, PA-C  Encounter Date: 03/04/2016      PT End of Session - 03/04/16 0856    Visit Number 4   Number of Visits 16   Date for PT Re-Evaluation 04/16/16   PT Start Time [id number]   PT Stop Time 0941   PT Time Calculation (min) 47 min   Activity Tolerance Patient tolerated treatment well      Past Medical History:  Diagnosis Date  . Hypercholesteremia   . Hypertension     Past Surgical History:  Procedure Laterality Date  . BLADDER REPAIR    . BREAST LUMPECTOMY Left   . CARPAL TUNNEL RELEASE Right   . CATARACT EXTRACTION, BILATERAL  2014  . REPAIR RECTOCELE      There were no vitals filed for this visit.      Subjective Assessment - 03/04/16 0857    Subjective Much better with TDN and TENS and HEP    Currently in Pain? Yes   Pain Score 5    Pain Location Hip   Pain Orientation Left   Pain Descriptors / Indicators Penetrating;Sharp   Pain Onset More than a month ago   Pain Frequency Intermittent                         [facility] Adult PT Treatment/Exercise - 03/04/16 0001      Moist Heat Therapy   Number Minutes Moist Heat 15 Minutes   Moist Heat Location Lumbar Spine;Hip  Lt     Acupuncturist Location Lt lumbar spine to Lt piriformis area    Electrical Stimulation Action IFC   Electrical Stimulation Parameters to tolerance   Electrical Stimulation Goals Pain;Tone     Manual Therapy   Manual therapy comments patient prone    Soft tissue mobilization piriformis; hip abductors through the posterior greater trochanter; posterior thigh; hamstrings   Myofascial Release Lt posterior hip           Trigger Point  Dry Needling - 03/04/16 0928    Consent Given? Yes   Gluteus Maximus Response Palpable increased muscle length   Gluteus Minimus Response Palpable increased muscle length   Piriformis Response Palpable increased muscle length   Tensor Fascia Lata Response Palpable increased muscle length   Hamstring Response Palpable increased muscle length                PT Short Term Goals - 02/26/16 0858      PT SHORT TERM GOAL #1   Title Complete LE assessment as pain levels allow & establish goals/POC as appropriate by 03/05/16   Status On-going     PT SHORT TERM GOAL #2   Title Independent with initial HEP by 03/05/16   Status On-going           PT Long Term Goals - 03/04/16 0929      PT LONG TERM GOAL #1   Title Independent with advanced HEP/gym program as indicated by 04/16/16   Status On-going     PT LONG TERM GOAL #2   Title Lumbar ROM WFL w/o pain or radicular symptoms by 04/16/16   Status On-going     PT LONG TERM GOAL #  3   Title Report overall reduction of low back/radicular LE and L knee pain >/= 75% for improved sleeping, sitting and standing tolerance by 04/16/16   Status On-going     PT LONG TERM GOAL #4   Title Ambulate with normal posture and gait pattern w/o limitation due to LBP or L knee pain >/= 3/10 by 04/16/16   Status On-going               Plan - 03/04/16 0930    Clinical Impression Statement Good response to TDN - working with TDN and manual techniques through the Lt QL and lumbar paraspinals; piriformis; hip abductors; TFL; lateral hamstrings. Needs to continue with stretching and stabilizatin. Progressing well toward stated goals of therapy.    Rehab Potential Good   PT Frequency 2x / week   PT Duration 8 weeks   PT Treatment/Interventions Patient/family education;Therapeutic exercise;Neuromuscular re-education;Manual techniques;Dry needling;Taping;Electrical Stimulation;Moist Heat;Ultrasound;Cryotherapy;Vasopneumatic Device;Iontophoresis  4mg /ml Dexamethasone;ADLs/Self Care Home Management   PT Next Visit Plan Review updated HEP and progress HEP as tolerated; LE flexibility; Lumbar stabilization & LE strengthening; Manual therapy including TDN as indicated; Modailities PRN for pain inlcuding ionto patch if approved by MD   Consulted and Agree with Plan of Care Patient      Patient will benefit from skilled therapeutic intervention in order to improve the following deficits and impairments:  Pain, Impaired flexibility, Decreased range of motion, Decreased strength, Increased muscle spasms, Decreased activity tolerance, Difficulty walking, Abnormal gait  Visit Diagnosis: Bilateral low back pain with left-sided sciatica  Pain in left knee  Stiffness of left knee, not elsewhere classified  Difficulty in walking, not elsewhere classified     Problem List Patient Active Problem List   Diagnosis Date Noted  . Palpitations 08/19/2015    Osmara Drummonds Nilda Simmer PT, MPH  03/04/2016, 9:33 AM  [facility] [address]  [place], [place], [address] Phone: [phone]   Fax:  [phone]  Name: Brittany Middleton MRN: [id number] Date of Birth: 03-31-1951

## 2016-03-09 ENCOUNTER — Ambulatory Visit: Payer: BLUE CROSS/BLUE SHIELD | Admitting: Physical Therapy

## 2016-03-09 DIAGNOSIS — M5442 Lumbago with sciatica, left side: Secondary | ICD-10-CM

## 2016-03-09 DIAGNOSIS — R262 Difficulty in walking, not elsewhere classified: Secondary | ICD-10-CM

## 2016-03-09 DIAGNOSIS — M25562 Pain in left knee: Secondary | ICD-10-CM

## 2016-03-09 DIAGNOSIS — M25662 Stiffness of left knee, not elsewhere classified: Secondary | ICD-10-CM

## 2016-03-09 NOTE — Therapy (Signed)
[facility] [address]  [place], [place], [address] Phone: [phone]   Fax:  [phone]  Physical Therapy Treatment  Patient Details  Name: Brittany Middleton MRN: [id number] Date of Birth: 1951-02-01 Referring Provider: Baldo Ash, PA-C  Encounter Date: 03/09/2016      PT End of Session - 03/09/16 0935    Visit Number 5   Number of Visits 16   Date for PT Re-Evaluation 04/16/16   PT Start Time 0935   PT Stop Time 1018   PT Time Calculation (min) 43 min   Activity Tolerance Patient tolerated treatment well   Behavior During Therapy [facility] for tasks assessed/performed      Past Medical History:  Diagnosis Date  . Hypercholesteremia   . Hypertension     Past Surgical History:  Procedure Laterality Date  . BLADDER REPAIR    . BREAST LUMPECTOMY Left   . CARPAL TUNNEL RELEASE Right   . CATARACT EXTRACTION, BILATERAL  2014  . REPAIR RECTOCELE      There were no vitals filed for this visit.      Subjective Assessment - 03/09/16 0935    Subjective Pt describing TDN as "that pain you love to hate" but thinks it has helped significantly.   Patient Stated Goals "To help alleviate the discomfort"   Currently in Pain? Yes   Pain Score --  2-3/10   Pain Location Sacrum  & ITB   Pain Orientation Left   Pain Descriptors / Indicators Dull;Aching           Today's Treatment  TherEx NuStep - lvl 3 x 5'  Manual STM/strumming to L ITB in R sidelying with lower leg supported on bolster STM/DTM to B SIJ & L piriformis  Kinesiotape    L ITB - 2 "I" strips - 30% along ITB from tibial tuberosity to upper thigh + 50% perpendicular strip at area of increased tenderness    B SIJ - 1 horizontal "I" strip 50% over B SIJ + 2 additional 50% strips each to form star over B SIJ  TherEx HS + Gastroc stretch with strap 2x30" ITB Stretch with strap 2x30"           PT Education - 03/09/16 1018    Education provided Yes   Education Details Purpose & mechanism of kinesiotaping   Person(s) Educated Patient   Methods Explanation;Demonstration   Comprehension Verbalized understanding          PT Short Term Goals - 02/26/16 0858      PT SHORT TERM GOAL #1   Title Complete LE assessment as pain levels allow & establish goals/POC as appropriate by 03/05/16   Status On-going     PT SHORT TERM GOAL #2   Title Independent with initial HEP by 03/05/16   Status On-going           PT Long Term Goals - 03/04/16 0929      PT LONG TERM GOAL #1   Title Independent with advanced HEP/gym program as indicated by 04/16/16   Status On-going     PT LONG TERM GOAL #2   Title Lumbar ROM WFL w/o pain or radicular symptoms by 04/16/16   Status On-going     PT LONG TERM GOAL #3   Title Report overall reduction of low back/radicular LE and L knee pain >/= 75% for improved sleeping, sitting and standing tolerance by 04/16/16   Status On-going  PT LONG TERM GOAL #4   Title Ambulate with normal posture and gait pattern w/o limitation due to LBP or L knee pain >/= 3/10 by 04/16/16   Status On-going               Plan - 03/09/16 1018    Clinical Impression Statement Pt reporting benefit from TDN/manual therapy and HEP with pain significantly reduced. Tenderness noted over L ITB/TFL and B SIJ with mild tenderness over L piriformis, therefore continued manual focus on these areas followed by taping. Pt to f/u with TDN as indicated next visit and will review/update lumbar stabilization exercises at follwing visit.   Rehab Potential Good   PT Frequency 2x / week   PT Duration 8 weeks   PT Treatment/Interventions Patient/family education;Therapeutic exercise;Neuromuscular re-education;Manual techniques;Dry needling;Taping;Electrical Stimulation;Moist Heat;Ultrasound;Cryotherapy;Vasopneumatic Device;Iontophoresis 4mg /ml Dexamethasone;ADLs/Self Care Home Management   PT Next Visit Plan  Review updated HEP and progress HEP as tolerated; LE flexibility; Lumbar stabilization & LE strengthening; Manual therapy including TDN as indicated; Modailities PRN for pain inlcuding ionto patch if approved by MD   Consulted and Agree with Plan of Care Patient      Patient will benefit from skilled therapeutic intervention in order to improve the following deficits and impairments:  Pain, Impaired flexibility, Decreased range of motion, Decreased strength, Increased muscle spasms, Decreased activity tolerance, Difficulty walking, Abnormal gait  Visit Diagnosis: Bilateral low back pain with left-sided sciatica  Pain in left knee  Stiffness of left knee, not elsewhere classified  Difficulty in walking, not elsewhere classified     Problem List Patient Active Problem List   Diagnosis Date Noted  . Palpitations 08/19/2015    Percival Spanish, PT, MPT 03/09/2016, 12:25 PM  [facility] [address]  [place], [place], [address] Phone: [phone]   Fax:  [phone]  Name: Brittany Middleton MRN: [id number] Date of Birth: Jun 30, 1950

## 2016-03-11 ENCOUNTER — Encounter: Payer: Self-pay | Admitting: Rehabilitative and Restorative Service Providers"

## 2016-03-11 ENCOUNTER — Ambulatory Visit: Payer: BLUE CROSS/BLUE SHIELD | Admitting: Rehabilitative and Restorative Service Providers"

## 2016-03-11 DIAGNOSIS — M5442 Lumbago with sciatica, left side: Secondary | ICD-10-CM

## 2016-03-11 DIAGNOSIS — R262 Difficulty in walking, not elsewhere classified: Secondary | ICD-10-CM

## 2016-03-11 DIAGNOSIS — M25662 Stiffness of left knee, not elsewhere classified: Secondary | ICD-10-CM

## 2016-03-11 DIAGNOSIS — M25562 Pain in left knee: Secondary | ICD-10-CM

## 2016-03-11 NOTE — Therapy (Signed)
[facility] [address]  [place], [place], [address] Phone: [phone]   Fax:  [phone]  Physical Therapy Treatment  Patient Details  Name: BERKELEY CARDIEL MRN: [id number] Date of Birth: Feb 21, 1951 Referring Provider: Baldo Ash, PA-C  Encounter Date: 03/11/2016      PT End of Session - 03/11/16 1624    Visit Number 6   Number of Visits 16   Date for PT Re-Evaluation 04/16/16   PT Start Time 1619   PT Stop Time 1705   PT Time Calculation (min) 46 min   Activity Tolerance Patient tolerated treatment well      Past Medical History:  Diagnosis Date  . Hypercholesteremia   . Hypertension     Past Surgical History:  Procedure Laterality Date  . BLADDER REPAIR    . BREAST LUMPECTOMY Left   . CARPAL TUNNEL RELEASE Right   . CATARACT EXTRACTION, BILATERAL  2014  . REPAIR RECTOCELE      There were no vitals filed for this visit.      Subjective Assessment - 03/11/16 1625    Subjective Patient reports that she has some "twinging" in the Lt leg and tightness in the Rt piriformis in the morning.    Currently in Pain? Yes   Pain Score 3    Pain Location Sacrum   Pain Orientation Left   Pain Descriptors / Indicators Dull;Aching   Pain Type Acute pain   Pain Onset More than a month ago   Pain Frequency Intermittent   Pain Score 2   Pain Location Leg   Pain Orientation Left;Lateral   Pain Type Acute pain   Pain Onset More than a month ago   Pain Frequency Intermittent        TherEx NuStep - lvl 3 x 5'  TherEx HS + Gastroc stretch with strap 2x30" ITB Stretch with strap 2x30"         [facility] Adult PT Treatment/Exercise - 03/11/16 0001      Ambulation/Gait   Gait Pattern --  improving gait pattern with less limp on Lt LE/improed WB     Moist Heat Therapy   Number Minutes Moist Heat 15 Minutes   Moist Heat Location Lumbar Spine;Hip  Lt     Electrical Stimulation   Electrical  Stimulation Location Lt piriformis area into the Lt TFL/ITB   Electrical Stimulation Action IFC   Electrical Stimulation Parameters to tolerance   Electrical Stimulation Goals Pain;Tone     Manual Therapy   Manual therapy comments patient prone    Soft tissue mobilization piriformis; hip abductors through the lateral hip and thigh inc TFL/ITB    Myofascial Release Lt posterior hip/TFL/ITB           Trigger Point Dry Needling - 03/11/16 1702    Gluteus Maximus Response Palpable increased muscle length   Gluteus Minimus Response Palpable increased muscle length   Piriformis Response Palpable increased muscle length   Tensor Fascia Lata Response Palpable increased muscle length              PT Education - 03/11/16 1703    Education provided Yes   Education Details keeping TB straight or padding TB as needed for exercise    Person(s) Educated Patient   Methods Explanation   Comprehension Verbalized understanding          PT Short Term Goals - 03/11/16 1707      PT SHORT TERM GOAL #1  Title Complete LE assessment as pain levels allow & establish goals/POC as appropriate by 03/05/16   Period Weeks   Status Achieved     PT SHORT TERM GOAL #2   Title Independent with initial HEP by 03/05/16   Status Achieved           PT Long Term Goals - 03/11/16 1625      PT LONG TERM GOAL #1   Title Independent with advanced HEP/gym program as indicated by 04/16/16   Status On-going     PT LONG TERM GOAL #2   Title Lumbar ROM WFL w/o pain or radicular symptoms by 04/16/16   Status On-going     PT LONG TERM GOAL #3   Title Report overall reduction of low back/radicular LE and L knee pain >/= 75% for improved sleeping, sitting and standing tolerance by 04/16/16   Status On-going     PT LONG TERM GOAL #4   Title Ambulate with normal posture and gait pattern w/o limitation due to LBP or L knee pain >/= 3/10 by 04/16/16   Status On-going               Plan -  03/11/16 1704    Clinical Impression Statement Progressing well with less palpable tightness noted through the posterior hip and hamstrings. Note tightness continued through the Lt piriformis and into Lt TFL/ITB. Good response to TDN and HEP. Gait pattern improved. Reports that she is working on the HEP but has not started her walking program. Will travel to [place] this weekend to see college friends. Discussed frequent breaks fot rest/stretching.    Rehab Potential Good   PT Frequency 2x / week   PT Duration 8 weeks   PT Treatment/Interventions Patient/family education;Therapeutic exercise;Neuromuscular re-education;Manual techniques;Dry needling;Taping;Electrical Stimulation;Moist Heat;Ultrasound;Cryotherapy;Vasopneumatic Device;Iontophoresis 4mg /ml Dexamethasone;ADLs/Self Care Home Management   PT Next Visit Plan Review and progress HEP and progress HEP as tolerated; LE flexibility; Lumbar stabilization & LE strengthening; Manual therapy including TDN as indicated; Modailities PRN for pain inlcuding ionto patch if approved by MD   Consulted and Agree with Plan of Care Patient      Patient will benefit from skilled therapeutic intervention in order to improve the following deficits and impairments:  Pain, Impaired flexibility, Decreased range of motion, Decreased strength, Increased muscle spasms, Decreased activity tolerance, Difficulty walking, Abnormal gait  Visit Diagnosis: Bilateral low back pain with left-sided sciatica  Pain in left knee  Stiffness of left knee, not elsewhere classified  Difficulty in walking, not elsewhere classified     Problem List Patient Active Problem List   Diagnosis Date Noted  . Palpitations 08/19/2015    Genetta Fiero Nilda Simmer PT, MPH  03/11/2016, 5:08 PM  [facility] [address]  [place], [place], [address] Phone: [phone]   Fax:  [phone]  Name: ADILENNE AHLES MRN:  [id number] Date of Birth: 1951-03-04

## 2016-03-16 ENCOUNTER — Ambulatory Visit: Payer: BLUE CROSS/BLUE SHIELD | Admitting: Physical Therapy

## 2016-03-16 DIAGNOSIS — M5442 Lumbago with sciatica, left side: Secondary | ICD-10-CM

## 2016-03-16 DIAGNOSIS — R262 Difficulty in walking, not elsewhere classified: Secondary | ICD-10-CM

## 2016-03-16 DIAGNOSIS — M25562 Pain in left knee: Secondary | ICD-10-CM

## 2016-03-16 DIAGNOSIS — M25662 Stiffness of left knee, not elsewhere classified: Secondary | ICD-10-CM

## 2016-03-16 NOTE — Patient Instructions (Signed)

## 2016-03-16 NOTE — Therapy (Signed)
[facility] [address]  [place], [place], [address] Phone: [phone]   Fax:  [phone]  Physical Therapy Treatment  Patient Details  Name: Brittany Middleton MRN: [id number] Date of Birth: 1950/08/15 Referring Provider: Baldo Ash, PA-C  Encounter Date: 03/16/2016      PT End of Session - 03/16/16 0801    Visit Number 7   Number of Visits 16   Date for PT Re-Evaluation 04/16/16   PT Start Time 0801   PT Stop Time 0852   PT Time Calculation (min) 51 min   Activity Tolerance Patient tolerated treatment well   Behavior During Therapy [facility] for tasks assessed/performed      Past Medical History:  Diagnosis Date  . Hypercholesteremia   . Hypertension     Past Surgical History:  Procedure Laterality Date  . BLADDER REPAIR    . BREAST LUMPECTOMY Left   . CARPAL TUNNEL RELEASE Right   . CATARACT EXTRACTION, BILATERAL  2014  . REPAIR RECTOCELE      There were no vitals filed for this visit.      Subjective Assessment - 03/16/16 0801    Subjective Pt reporting "so much better". Still notes some tightness but states she was sitting a lot more this weekend. Able to 6 hrs of driving with breaks every hour w/o any discomfort.   Currently in Pain? Yes   Pain Score --  1-2/10   Pain Location Back  & buttock   Pain Orientation Left;Lower   Pain Onset More than a month ago   Pain Onset More than a month ago            Today's Treatment  TherEx NuStep - lvl 5 x 5'  Manual B ITB, Figure 4 & KTOS piriformis stretches x30" each STM/strumming to L ITB in R sidelying with lower leg supported on bolster STM & manual stretching to L QL  TherEx Abdominal bracing/Pelvic tilt 10x5" TrA + Hooklying Hip ABD/ER with green TB 10x3" TrA + Hooklying LE March with green TB 10x3" B Sidelying Clam with green TB 10x3"  Modalities Ionto Patch (#1 of 6) to L greater trochanter - Dexamethasone 1 mL, 80  mA-min, 4-6 hr wear time            PT Education - 03/16/16 [id number]    Education provided Yes   Education Details Iontophoresis patch education   Person(s) Educated Patient   Methods Explanation;Handout   Comprehension Verbalized understanding          PT Short Term Goals - 03/11/16 1707      PT SHORT TERM GOAL #1   Title Complete LE assessment as pain levels allow & establish goals/POC as appropriate by 03/05/16   Period Weeks   Status Achieved     PT SHORT TERM GOAL #2   Title Independent with initial HEP by 03/05/16   Status Achieved           PT Long Term Goals - 03/11/16 1625      PT LONG TERM GOAL #1   Title Independent with advanced HEP/gym program as indicated by 04/16/16   Status On-going     PT LONG TERM GOAL #2   Title Lumbar ROM WFL w/o pain or radicular symptoms by 04/16/16   Status On-going     PT LONG TERM GOAL #3   Title Report overall reduction of low back/radicular LE and L knee pain >/= 75% for improved  sleeping, sitting and standing tolerance by 04/16/16   Status On-going     PT LONG TERM GOAL #4   Title Ambulate with normal posture and gait pattern w/o limitation due to LBP or L knee pain >/= 3/10 by 04/16/16   Status On-going               Plan - 03/16/16 0835    Clinical Impression Statement Pt noting significant improvement in muscle tension and pain with improving activity tolerance including and decreasing limp with gait. HEP reviewed with suggestions made for padding theraband due to discomfort reported when band rolls during HEP, but otherwise pt demonstrating good tolerance for HEP. B sidelying clam introduced but pt noting pain over L greater trochanter while in L sidelying with closer examination revealing tenderness at L greater trochanteric bursa therefore initiated trial of ionto patch to this area.   Rehab Potential Good   PT Frequency 2x / week   PT Duration 8 weeks   PT Treatment/Interventions Patient/family  education;Therapeutic exercise;Neuromuscular re-education;Manual techniques;Dry needling;Taping;Electrical Stimulation;Moist Heat;Ultrasound;Cryotherapy;Vasopneumatic Device;Iontophoresis 4mg /ml Dexamethasone;ADLs/Self Care Home Management   PT Next Visit Plan Assess response to ionto patch; LE flexibility; Lumbar stabilization & LE strengthening; Manual therapy including TDN as indicated; Modaliities PRN for pain inlcuding ionto patch #2 of 6 if benefit noted; Progress HEP as tolerated   Consulted and Agree with Plan of Care Patient      Patient will benefit from skilled therapeutic intervention in order to improve the following deficits and impairments:  Pain, Impaired flexibility, Decreased range of motion, Decreased strength, Increased muscle spasms, Decreased activity tolerance, Difficulty walking, Abnormal gait  Visit Diagnosis: Bilateral low back pain with left-sided sciatica  Pain in left knee  Stiffness of left knee, not elsewhere classified  Difficulty in walking, not elsewhere classified     Problem List Patient Active Problem List   Diagnosis Date Noted  . Palpitations 08/19/2015    Percival Spanish, PT, MPT 03/16/2016, 9:08 AM  [facility] [address]  [place], [place], [address] Phone: [phone]   Fax:  [phone]  Name: Brittany Middleton MRN: [id number] Date of Birth: 08/01/1950

## 2016-03-18 ENCOUNTER — Encounter: Payer: Self-pay | Admitting: Rehabilitative and Restorative Service Providers"

## 2016-03-18 ENCOUNTER — Ambulatory Visit: Payer: BLUE CROSS/BLUE SHIELD | Admitting: Rehabilitative and Restorative Service Providers"

## 2016-03-18 DIAGNOSIS — M25662 Stiffness of left knee, not elsewhere classified: Secondary | ICD-10-CM

## 2016-03-18 DIAGNOSIS — M5442 Lumbago with sciatica, left side: Secondary | ICD-10-CM | POA: Diagnosis not present

## 2016-03-18 DIAGNOSIS — R262 Difficulty in walking, not elsewhere classified: Secondary | ICD-10-CM

## 2016-03-18 DIAGNOSIS — M25562 Pain in left knee: Secondary | ICD-10-CM

## 2016-03-18 NOTE — Therapy (Signed)
[facility] [address]  [place], [place], [address] Phone: [phone]   Fax:  [phone]  Physical Therapy Treatment  Patient Details  Name: Brittany Middleton MRN: [id number] Date of Birth: 1950/07/05 Referring Provider: Baldo Ash, PA-C  Encounter Date: 03/18/2016      PT End of Session - 03/18/16 0900    Visit Number 8   Number of Visits 16   Date for PT Re-Evaluation 04/16/16   PT Start Time 0853   PT Stop Time 0943   PT Time Calculation (min) 50 min   Activity Tolerance Patient tolerated treatment well      Past Medical History:  Diagnosis Date  . Hypercholesteremia   . Hypertension     Past Surgical History:  Procedure Laterality Date  . BLADDER REPAIR    . BREAST LUMPECTOMY Left   . CARPAL TUNNEL RELEASE Right   . CATARACT EXTRACTION, BILATERAL  2014  . REPAIR RECTOCELE      There were no vitals filed for this visit.      Subjective Assessment - 03/18/16 0901    Subjective Much better. Went to a play this week and noted increased symptoms. Otherwise doing well. Working on exercises at home. Ionto helped "a lot" on the hip - not really bothering her today in that area - does not feel she needs the ionto today.    Currently in Pain? Yes   Pain Score 3    Pain Location Back   Pain Orientation Left;Lower   Pain Descriptors / Indicators Aching;Dull   Pain Score 1   Pain Location Leg   Pain Orientation Left;Posterior;Lateral   Pain Descriptors / Indicators Aching                         [facility] Adult PT Treatment/Exercise - 03/18/16 0001      Moist Heat Therapy   Number Minutes Moist Heat 15 Minutes   Moist Heat Location Lumbar Spine;Hip  Lt     Electrical Stimulation   Electrical Stimulation Location Lt piriformis area into the Lt TFL/ITB   Electrical Stimulation Action IFC   Electrical Stimulation Parameters to tolerance   Electrical Stimulation Goals Pain;Tone      Manual Therapy   Manual therapy comments patient prone    Soft tissue mobilization bilat lumbar paraspinals; Lt piriformis; hip abductors through the lateral hip and thigh inc TFL/ITB    Myofascial Release Lt posterior hip/TFL/ITB           Trigger Point Dry Needling - 03/18/16 0924    Consent Given? Yes   Gluteus Maximus Response Palpable increased muscle length   Gluteus Minimus Response Palpable increased muscle length   Piriformis Response Palpable increased muscle length   Hamstring Response Palpable increased muscle length                PT Short Term Goals - 03/11/16 1707      PT SHORT TERM GOAL #1   Title Complete LE assessment as pain levels allow & establish goals/POC as appropriate by 03/05/16   Period Weeks   Status Achieved     PT SHORT TERM GOAL #2   Title Independent with initial HEP by 03/05/16   Status Achieved           PT Long Term Goals - 03/11/16 1625      PT LONG TERM GOAL #1   Title Independent with advanced HEP/gym  program as indicated by 04/16/16   Status On-going     PT LONG TERM GOAL #2   Title Lumbar ROM WFL w/o pain or radicular symptoms by 04/16/16   Status On-going     PT LONG TERM GOAL #3   Title Report overall reduction of low back/radicular LE and L knee pain >/= 75% for improved sleeping, sitting and standing tolerance by 04/16/16   Status On-going     PT LONG TERM GOAL #4   Title Ambulate with normal posture and gait pattern w/o limitation due to LBP or L knee pain >/= 3/10 by 04/16/16   Status On-going               Plan - 03/18/16 0925    Clinical Impression Statement Some flare up of sympotms in LB and Lt hip this week related to sitting at a play for ~3 hours Tuesday night. Patient had increased muscular tightness through the lumbar paraspinals and Lt hip musculature into the Lt hamstrings. She responded well to TDN and manual work through that same area. Education re positioning; changing positions;  anticipating stressful positions and acting proactively. Patient also cautioned about lying prone with Lt LE in abducted externally rotated position - even slight rotatioin will likely irritate symptoms. Patient will continue with HEP and progress stretching and strengthening as tolerated.    Rehab Potential Good   PT Frequency 2x / week   PT Duration 8 weeks   PT Treatment/Interventions Patient/family education;Therapeutic exercise;Neuromuscular re-education;Manual techniques;Dry needling;Taping;Electrical Stimulation;Moist Heat;Ultrasound;Cryotherapy;Vasopneumatic Device;Iontophoresis 4mg /ml Dexamethasone;ADLs/Self Care Home Management   PT Next Visit Plan Assess response to ionto patch; LE flexibility; Lumbar stabilization & LE strengthening; Manual therapy including TDN as indicated; Modaliities PRN for pain inlcuding ionto patch #2 of 6 if benefit noted; Progress HEP as tolerated   Consulted and Agree with Plan of Care Patient      Patient will benefit from skilled therapeutic intervention in order to improve the following deficits and impairments:  Pain, Impaired flexibility, Decreased range of motion, Decreased strength, Increased muscle spasms, Decreased activity tolerance, Difficulty walking, Abnormal gait  Visit Diagnosis: Bilateral low back pain with left-sided sciatica  Pain in left knee  Stiffness of left knee, not elsewhere classified  Difficulty in walking, not elsewhere classified     Problem List Patient Active Problem List   Diagnosis Date Noted  . Palpitations 08/19/2015    [name] Nilda Simmer PT, MPH  03/18/2016, 9:33 AM  [facility] [address]  [place], [place], [address] Phone: [phone]   Fax:  [phone]  Name: Brittany Middleton MRN: [id number] Date of Birth: [birth year]

## 2016-03-23 ENCOUNTER — Ambulatory Visit: Payer: BLUE CROSS/BLUE SHIELD | Attending: Physician Assistant | Admitting: Physical Therapy

## 2016-03-23 DIAGNOSIS — M25662 Stiffness of left knee, not elsewhere classified: Secondary | ICD-10-CM | POA: Diagnosis present

## 2016-03-23 DIAGNOSIS — R262 Difficulty in walking, not elsewhere classified: Secondary | ICD-10-CM | POA: Diagnosis present

## 2016-03-23 DIAGNOSIS — M5442 Lumbago with sciatica, left side: Secondary | ICD-10-CM | POA: Diagnosis not present

## 2016-03-23 DIAGNOSIS — M25562 Pain in left knee: Secondary | ICD-10-CM | POA: Diagnosis present

## 2016-03-23 NOTE — Therapy (Signed)
[facility] [address]  [address] [place], [place], [address] Phone: [phone]   Fax:  [phone]  Physical Therapy Treatment  Patient Details  Name: Brittany Middleton MRN: [id number] Date of Birth: 12/10/1950 Referring Provider: Estanislado SpireJosh Chadwell, PA-C  Encounter Date: 03/23/2016      PT End of Session - 03/23/16 0852    Visit Number 9   Number of Visits 16   Date for PT Re-Evaluation 04/16/16   PT Start Time 0852  Pt arrived late   PT Stop Time 0934   PT Time Calculation (min) 42 min   Activity Tolerance Patient tolerated treatment well      Past Medical History:  Diagnosis Date  . Hypercholesteremia   . Hypertension     Past Surgical History:  Procedure Laterality Date  . BLADDER REPAIR    . BREAST LUMPECTOMY Left   . CARPAL TUNNEL RELEASE Right   . CATARACT EXTRACTION, BILATERAL  2014  . REPAIR RECTOCELE      There were no vitals filed for this visit.      Subjective Assessment - 03/23/16 0852    Subjective Pt continues to note benefit from TDN. Also feels like ionto patch has helped the bursitis and would like to try another patch today. 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   Patient Stated Goals "To help alleviate the discomfort"   Currently in Pain? Yes   Pain Score 2    Pain Location Back   Pain Orientation Left;Lower   Pain Descriptors / Indicators Sharp   Pain Score 4   Pain Location Knee   Pain Orientation Left   Pain Descriptors / Indicators Dull;Aching;Tightness         Today's Treatment  TherEx NuStep - lvl 5 x 5' B HS + Gastroc stretch with strap 2x30" B ITB Stretch with strap 2x30" B KTOS Piriformis stretches 2x30" Abdominal bracing/Pelvic tilt 10x5" Bridge + Hip ABD isometric with blue TB 10x5" Hooklying Hip ABD/ER with blue TB 10x3" TrA + Hooklying LE March with blue TB  10x3"  Kinesiotaping R knee - 3 strips - 30% med & lat to patella crossing at tibial tuberosity & distal quad, 50% horizontally over inferior patella/patellar tendon extending along medial joint line  Modalities Ionto Patch (#2 of 6) to L greater trochanter - Dexamethasone 1 mL, 80 mA-min, 4-6 hr wear time           PT Short Term Goals - 03/11/16 1707      PT SHORT TERM GOAL #1   Title Complete LE assessment as pain levels allow & establish goals/POC as appropriate by 03/05/16   Period Weeks   Status Achieved     PT SHORT TERM GOAL #2   Title Independent with initial HEP by 03/05/16   Status Achieved           PT Long Term Goals - 03/11/16 1625      PT LONG TERM GOAL #1   Title Independent with advanced HEP/gym program as indicated by 04/16/16   Status On-going     PT LONG TERM GOAL #2   Title Lumbar ROM WFL w/o pain or radicular symptoms by 04/16/16   Status On-going     PT LONG TERM GOAL #3   Title Report overall reduction of low back/radicular LE and L knee pain >/= 75% for improved sleeping,  sitting and standing tolerance by 04/16/16   Status On-going     PT LONG TERM GOAL #4   Title Ambulate with normal posture and gait pattern w/o limitation due to LBP or L knee pain >/= 3/10 by 04/16/16   Status On-going               Plan - 03/23/16 0904    Clinical Impression Statement Pt preparing to leave for 2 week trip and requested review of HEP prior to going away. Provided clarification of stretches and progressed TB to blue with good pt tolerance therefore blue band issued. Pt reporting increased knee pain after her grandson fell on her knee with increased point tenderness along medial patella and medial joint line, therefore applied kinesiotape in effort to reduce the knee pain. Pt reporting positive response to initial ionto patch lasting over 2 weeks, but requested a 2nd patch due to increasing pain.   Rehab Potential Good   PT Treatment/Interventions  Patient/family education;Therapeutic exercise;Neuromuscular re-education;Manual techniques;Dry needling;Taping;Electrical Stimulation;Moist Heat;Ultrasound;Cryotherapy;Vasopneumatic Device;Iontophoresis 4mg /ml Dexamethasone;ADLs/Self Care Home Management   PT Next Visit Plan LE flexibility; Lumbar stabilization & LE strengthening; Manual therapy including TDN as indicated; Modaliities PRN for pain inlcuding ionto patch #3 of 6 if benefit noted; Progress HEP as tolerated   Consulted and Agree with Plan of Care Patient      Patient will benefit from skilled therapeutic intervention in order to improve the following deficits and impairments:  Pain, Impaired flexibility, Decreased range of motion, Decreased strength, Increased muscle spasms, Decreased activity tolerance, Difficulty walking, Abnormal gait  Visit Diagnosis: Acute bilateral low back pain with left-sided sciatica  Acute pain of left knee  Stiffness of left knee, not elsewhere classified  Difficulty in walking, not elsewhere classified     Problem List Patient Active Problem List   Diagnosis Date Noted  . Palpitations 08/19/2015    Percival Spanish, PT, MPT 03/23/2016, 2:00 PM  [facility] [address]  [place], [place], [address] Phone: [phone]   Fax:  [phone]  Name: Brittany Middleton MRN: [id number] Date of Birth: September 21, 1950

## 2016-04-06 ENCOUNTER — Ambulatory Visit: Payer: BLUE CROSS/BLUE SHIELD | Admitting: Physical Therapy

## 2016-04-06 DIAGNOSIS — R262 Difficulty in walking, not elsewhere classified: Secondary | ICD-10-CM

## 2016-04-06 DIAGNOSIS — M25662 Stiffness of left knee, not elsewhere classified: Secondary | ICD-10-CM

## 2016-04-06 DIAGNOSIS — M5442 Lumbago with sciatica, left side: Secondary | ICD-10-CM | POA: Diagnosis not present

## 2016-04-06 DIAGNOSIS — M25562 Pain in left knee: Secondary | ICD-10-CM

## 2016-04-06 NOTE — Therapy (Signed)
[facility] [address]  [place], [place], [address] Phone: [phone]   Fax:  [phone]  Physical Therapy Treatment  Patient Details  Name: Brittany Middleton MRN: [id number] Date of Birth: 1950-12-01 Referring Provider: Baldo Ash, PA-C  Encounter Date: 04/06/2016      PT End of Session - 04/06/16 0848    Visit Number 10   Number of Visits 16   Date for PT Re-Evaluation 04/16/16   PT Start Time 0848   PT Stop Time 0934   PT Time Calculation (min) 46 min   Activity Tolerance Patient tolerated treatment well      Past Medical History:  Diagnosis Date  . Hypercholesteremia   . Hypertension     Past Surgical History:  Procedure Laterality Date  . BLADDER REPAIR    . BREAST LUMPECTOMY Left   . CARPAL TUNNEL RELEASE Right   . CATARACT EXTRACTION, BILATERAL  2014  . REPAIR RECTOCELE      There were no vitals filed for this visit.      Subjective Assessment - 04/06/16 0848    Subjective Pt returning from traveling for ~2 weeks and reports good tolerance for travel with only minor pain limitations. Reports limited opportunity to complete HEP exercises while traveling.   Patient Stated Goals "To help alleviate the discomfort"   Currently in Pain? Yes   Pain Score 1    Pain Orientation Left;Lower            [facility] PT Assessment - 04/06/16 0848      Assessment   Medical Diagnosis LBP with L LE sciatica & L knee pain   Onset Date/Surgical Date --  2 weeks     Observation/Other Assessments   Focus on Therapeutic Outcomes (FOTO)  Lumbar Spine - 52% (48% limitation)     ROM / Strength   AROM / PROM / Strength AROM;Strength     AROM   AROM Assessment Site Lumbar;Knee   Right Knee Extension 0   Right Knee Flexion 130   Left Knee Extension 15   Left Knee Flexion 115   Lumbar Flexion hands to ankles - tight, but no increased pain   Lumbar Extension 50% - decreases pain   Lumbar - Right  Side Bend hand to lower thigh - no increased pain   Lumbar - Left Side Bend hand to lat knee - no increased pain   Lumbar - Right Rotation 75% - no increased pain   Lumbar - Left Rotation WFL - no increased pain     Strength   Strength Assessment Site Hip;Knee   Right Hip Flexion 4+/5   Right Hip Extension 4/5   Right Hip ABduction 4+/5   Right Hip ADduction 4-/5   Left Hip Flexion 4-/5   Left Hip Extension 4/5   Left Hip ABduction 4/5   Left Hip ADduction 3+/5   Right Knee Flexion 5/5   Right Knee Extension 5/5   Left Knee Flexion 4+/5   Left Knee Extension 4/5  pain       Today's Treatment  TherEx NuStep - lvl 6x 5' Hooklying Hip ADD stretch (limited tolerance d/t medial L knee pain)  Manual B HS + Gastroc stretch x30" B Supine ITB stretch x30" L Figure 4 piriformis stretch  (poor tolerance d/t c/o medial L knee pain)  Kinesiotaping L knee pes anserine - 3 strips - 30% along sartorius & gracilis muscles and med & lat to patella  crossing at tibial tuberosity & distal quad, 50% horizontally over pes anserine extending to medial knee  Modalities Ionto Patch (#3of 6) to L greater trochanter - Dexamethasone 1 mL, 80 mA-min, 4-6 hr wear time            PT Short Term Goals - 03/11/16 1707      PT SHORT TERM GOAL #1   Title Complete LE assessment as pain levels allow & establish goals/POC as appropriate by 03/05/16   Period Weeks   Status Achieved     PT SHORT TERM GOAL #2   Title Independent with initial HEP by 03/05/16   Status Achieved           PT Long Term Goals - 04/06/16 0910      PT LONG TERM GOAL #1   Title Independent with advanced HEP/gym program as indicated by 04/16/16   Status Partially Met  Met for current HEP     PT LONG TERM GOAL #2   Title Lumbar ROM WFL w/o pain or radicular symptoms by 04/16/16   Status Partially Met  ROM improving with remaining limitation due to tightness rather than pain     PT LONG TERM GOAL #3   Title  Report overall reduction of low back/radicular LE and L knee pain >/= 75% for improved sleeping, sitting and standing tolerance by 04/16/16   Status Partially Met  low back/radicular pain 80-100% improved, L knee pain 40% improved - pt continues to note limited standing/walking tolerance mostly d/t L knee pain & weakness     PT LONG TERM GOAL #4   Title Ambulate with normal posture and gait pattern w/o limitation due to LBP or L knee pain >/= 3/10 by 04/16/16   Status Partially Met  demostrates improved posture but continues to have limp d/t L knee pain               Plan - 04/06/16 0854    Clinical Impression Statement Pt returning from ~2 week trip where she was traveling by car to [place] and [place]. Pt reporting good tolernace for traveling as well as activities at destinations with only mild pain noted in low back, with current issues more stiffness than pain. Pt continues to note benefit from stretching but feels like unable to totally resolve tightness with stretching. Continues to report increased medial L knee pain since her grandson fell on her knee prior to her trip with limited tolerance for some stretches and decreased extension ROM noted today. Pain appears more localized to pes anserinus bursa and medial joint line, therefore applied kinesiotape to this area today and may try ionto patch on future visit if pain persists.   Rehab Potential Good   PT Treatment/Interventions Patient/family education;Therapeutic exercise;Neuromuscular re-education;Manual techniques;Dry needling;Taping;Electrical Stimulation;Moist Heat;Ultrasound;Cryotherapy;Vasopneumatic Device;Iontophoresis 19m/ml Dexamethasone;ADLs/Self Care Home Management   PT Next Visit Plan LE flexibility; Lumbar stabilization & LE strengthening; Manual therapy including TDN as indicated; Modaliities PRN for pain inlcuding ionto patch #4 of 6 if benefit noted; Progress HEP as tolerated   Consulted and Agree with Plan of Care Patient       Patient will benefit from skilled therapeutic intervention in order to improve the following deficits and impairments:  Pain, Impaired flexibility, Decreased range of motion, Decreased strength, Increased muscle spasms, Decreased activity tolerance, Difficulty walking, Abnormal gait  Visit Diagnosis: Acute bilateral low back pain with left-sided sciatica  Acute pain of left knee  Stiffness of left knee, not elsewhere classified  Difficulty in walking,  not elsewhere classified     Problem List Patient Active Problem List   Diagnosis Date Noted  . Palpitations 08/19/2015    Percival Spanish, PT, MPT 04/06/2016, 11:12 AM  [facility] [address]  [place], [place], [address] Phone: [phone]   Fax:  [phone]  Name: ELLIEANNA FUNDERBURG MRN: [id number] Date of Birth: 10-Jan-1951

## 2016-04-08 ENCOUNTER — Ambulatory Visit: Payer: BLUE CROSS/BLUE SHIELD | Admitting: Physical Therapy

## 2016-04-08 DIAGNOSIS — M25562 Pain in left knee: Secondary | ICD-10-CM

## 2016-04-08 DIAGNOSIS — R262 Difficulty in walking, not elsewhere classified: Secondary | ICD-10-CM

## 2016-04-08 DIAGNOSIS — M5442 Lumbago with sciatica, left side: Secondary | ICD-10-CM | POA: Diagnosis not present

## 2016-04-08 DIAGNOSIS — M25662 Stiffness of left knee, not elsewhere classified: Secondary | ICD-10-CM

## 2016-04-08 NOTE — Therapy (Signed)
[facility] [address]  [place], [place], [address] Phone: [phone]   Fax:  [phone]  Physical Therapy Treatment  Patient Details  Name: Brittany Middleton MRN: [id number] Date of Birth: [birth year] Referring Provider: Baldo Ash, PA-C  Encounter Date: 04/08/2016      PT End of Session - 04/08/16 0857    Visit Number 11   Number of Visits 16   Date for PT Re-Evaluation 04/16/16   PT Start Time 3149   PT Stop Time 1001   PT Time Calculation (min) 64 min   Activity Tolerance Patient tolerated treatment well;Patient limited by pain   Behavior During Therapy [facility] for tasks assessed/performed      Past Medical History:  Diagnosis Date  . Hypercholesteremia   . Hypertension     Past Surgical History:  Procedure Laterality Date  . BLADDER REPAIR    . BREAST LUMPECTOMY Left   . CARPAL TUNNEL RELEASE Right   . CATARACT EXTRACTION, BILATERAL  2014  . REPAIR RECTOCELE      There were no vitals filed for this visit.      Subjective Assessment - 04/08/16 0900    Subjective Pt reports she took a Tylenol PM last night and woke up in the same position she fell asleep in, with a "stitch" in her side. Notes that the taping for her knee seemed to help with the pain she was having.   Patient Stated Goals "To help alleviate the discomfort"   Currently in Pain? Yes   Pain Score 6    Pain Location Back   Pain Orientation Right;Lower           Today's Treatment  TherEx NuStep - lvl 6x 5' LTR 10x10"  Manual B HS + Gastroc stretch x30" B Supine ITB stretch x30" B Figure 4 piriformis & KTOS stretches  TDN per flowsheet (performed by Gillermo Murdoch, PT)  Modalities Premod estim to R piriformis & L adductors - intensity to pt tolerance x 15' Moist heat packs to R piriformis & L adductors in conjunction with estim          Trigger Point Dry Needling - 04/08/16 0857    Consent Given? Yes    Muscles Treated Lower Body Gluteus maximus;Gluteus minimus;Piriformis;Hamstring;Adductor longus/brevius/maximus   Gluteus Maximus Response Palpable increased muscle length  Right   Gluteus Minimus Response Palpable increased muscle length  Right   Piriformis Response Palpable increased muscle length  Right   Adductor Response Palpable increased muscle length  Left   Hamstring Response Palpable increased muscle length  Left                PT Short Term Goals - 03/11/16 1707      PT SHORT TERM GOAL #1   Title Complete LE assessment as pain levels allow & establish goals/POC as appropriate by 03/05/16   Period Weeks   Status Achieved     PT SHORT TERM GOAL #2   Title Independent with initial HEP by 03/05/16   Status Achieved           PT Long Term Goals - 04/06/16 0910      PT LONG TERM GOAL #1   Title Independent with advanced HEP/gym program as indicated by 04/16/16   Status Partially Met  Met for current HEP     PT LONG TERM GOAL #2   Title Lumbar ROM WFL w/o pain or radicular symptoms by 04/16/16  Status Partially Met  ROM improving with remaining limitation due to tightness rather than pain     PT LONG TERM GOAL #3   Title Report overall reduction of low back/radicular LE and L knee pain >/= 75% for improved sleeping, sitting and standing tolerance by 04/16/16   Status Partially Met  low back/radicular pain 80-100% improved, L knee pain 40% improved - pt continues to note limited standing/walking tolerance mostly d/t L knee pain & weakness     PT LONG TERM GOAL #4   Title Ambulate with normal posture and gait pattern w/o limitation due to LBP or L knee pain >/= 3/10 by 04/16/16   Status Partially Met  demostrates improved posture but continues to have limp d/t L knee pain               Plan - 04/08/16 0903    Clinical Impression Statement Pt woke up this morning with a "stitch" in her R posterior/lateral hip with increased tension noted in R  piriformis & gluts. TDN, STM and stretching seemed to alleviate musch of this pain. Pt also noting some improvement after taping for L medial knee but still having increased pain/tension in medial hamstrings and adductors, therefore TDN directed to this area as well. Treatment concluded with estim and moist heat. Pt reporting improvement in pain and muscle tension by end of session.   Rehab Potential Good   PT Treatment/Interventions Patient/family education;Therapeutic exercise;Neuromuscular re-education;Manual techniques;Dry needling;Taping;Electrical Stimulation;Moist Heat;Ultrasound;Cryotherapy;Vasopneumatic Device;Iontophoresis 19m/ml Dexamethasone;ADLs/Self Care Home Management   PT Next Visit Plan LE flexibility; Lumbar stabilization & LE strengthening; Manual therapy including TDN as indicated; Modaliities PRN for pain inlcuding ionto patch #4 of 6 if benefit noted; Progress HEP as tolerated   Consulted and Agree with Plan of Care Patient      Patient will benefit from skilled therapeutic intervention in order to improve the following deficits and impairments:  Pain, Impaired flexibility, Decreased range of motion, Decreased strength, Increased muscle spasms, Decreased activity tolerance, Difficulty walking, Abnormal gait  Visit Diagnosis: Acute bilateral low back pain with left-sided sciatica  Acute pain of left knee  Stiffness of left knee, not elsewhere classified  Difficulty in walking, not elsewhere classified     Problem List Patient Active Problem List   Diagnosis Date Noted  . Palpitations 08/19/2015    JPercival Spanish PT, MPT 04/08/2016, 10:38 AM  [facility] [place] [address]Phone: [phone]  Fax:  [phone] Name: Brittany SEBRINGMRN: [id number] of Birth: [birth year]

## 2016-04-13 ENCOUNTER — Ambulatory Visit: Payer: BLUE CROSS/BLUE SHIELD | Admitting: Physical Therapy

## 2016-04-13 DIAGNOSIS — M25662 Stiffness of left knee, not elsewhere classified: Secondary | ICD-10-CM

## 2016-04-13 DIAGNOSIS — M25562 Pain in left knee: Secondary | ICD-10-CM

## 2016-04-13 DIAGNOSIS — R262 Difficulty in walking, not elsewhere classified: Secondary | ICD-10-CM

## 2016-04-13 DIAGNOSIS — M5442 Lumbago with sciatica, left side: Secondary | ICD-10-CM | POA: Diagnosis not present

## 2016-04-13 NOTE — Therapy (Signed)
[facility] [address]  [place], [place], [address] Phone: [phone]   Fax:  [phone]  Physical Therapy Treatment  Patient Details  Name: Brittany Middleton MRN: [id number] Date of Birth: 01/14/1951 Referring Provider: Baldo Ash, PA-C  Encounter Date: 04/13/2016      PT End of Session - 04/13/16 0847    Visit Number 12   Number of Visits 16   Date for PT Re-Evaluation 04/16/16   PT Start Time 0847   PT Stop Time 0932   PT Time Calculation (min) 45 min   Activity Tolerance Patient tolerated treatment well;Patient limited by pain   Behavior During Therapy [facility] for tasks assessed/performed      Past Medical History:  Diagnosis Date  . Hypercholesteremia   . Hypertension     Past Surgical History:  Procedure Laterality Date  . BLADDER REPAIR    . BREAST LUMPECTOMY Left   . CARPAL TUNNEL RELEASE Right   . CATARACT EXTRACTION, BILATERAL  2014  . REPAIR RECTOCELE      There were no vitals filed for this visit.      Subjective Assessment - 04/13/16 0851    Subjective Pt feels that needling helped and pain at her knee is less.   Patient Stated Goals "To help alleviate the discomfort"   Currently in Pain? Yes   Pain Score 2    Pain Location Back   Pain Orientation Right;Lower   Pain Score 3   Pain Location Knee   Pain Orientation Left          Today's Treatment  TherEx Treadmill - 1.9 mph x 5'  Manual STM/MFR & IASTM to L hip adductors & medial hamstrings with TPR as indicated  TherEx L HS stretch with strap targeting medial HS 2x30" Friog Hip Adductor stretch 2x30" Standing L Hip Adductor stretch 2x30"  Kinesiotaping L knee pes anserine - 3 strips - 30% along gracilis & medial HS muscles, 50% horizontally over pes anserine extending to medial knee          PT Education - 04/13/16 0930    Education provided Yes   Education Details Hip adductor & medial HS stretches    Person(s) Educated Patient   Methods Explanation;Demonstration;Handout   Comprehension Verbalized understanding;Returned demonstration          PT Short Term Goals - 03/11/16 1707      PT SHORT TERM GOAL #1   Title Complete LE assessment as pain levels allow & establish goals/POC as appropriate by 03/05/16   Period Weeks   Status Achieved     PT SHORT TERM GOAL #2   Title Independent with initial HEP by 03/05/16   Status Achieved           PT Long Term Goals - 04/06/16 0910      PT LONG TERM GOAL #1   Title Independent with advanced HEP/gym program as indicated by 04/16/16   Status Partially Met  Met for current HEP     PT LONG TERM GOAL #2   Title Lumbar ROM WFL w/o pain or radicular symptoms by 04/16/16   Status Partially Met  ROM improving with remaining limitation due to tightness rather than pain     PT LONG TERM GOAL #3   Title Report overall reduction of low back/radicular LE and L knee pain >/= 75% for improved sleeping, sitting and standing tolerance by 04/16/16   Status Partially Met  low back/radicular  pain 80-100% improved, L knee pain 40% improved - pt continues to note limited standing/walking tolerance mostly d/t L knee pain & weakness     PT LONG TERM GOAL #4   Title Ambulate with normal posture and gait pattern w/o limitation due to LBP or L knee pain >/= 3/10 by 04/16/16   Status Partially Met  demostrates improved posture but continues to have limp d/t L knee pain               Plan - 04/13/16 0853    Clinical Impression Statement Pain improved in medial L knee after TDN last visit, but continued incresaed muscle tension & ttp noted, therefore continued focus on manual therapy for STM & MFR with instructions provided for stretching of hip adductors and medial HS. Kinesiotaping reapplied to this area to promote continued muscle relaxation.   Rehab Potential Good   PT Treatment/Interventions Patient/family education;Therapeutic  exercise;Neuromuscular re-education;Manual techniques;Dry needling;Taping;Electrical Stimulation;Moist Heat;Ultrasound;Cryotherapy;Vasopneumatic Device;Iontophoresis 58m/ml Dexamethasone;ADLs/Self Care Home Management   PT Next Visit Plan LE flexibility; Lumbar stabilization & LE strengthening; Manual therapy including TDN as indicated; Modaliities PRN for pain inlcuding ionto patch #4 of 6 if benefit noted; Progress HEP as tolerated   Consulted and Agree with Plan of Care Patient      Patient will benefit from skilled therapeutic intervention in order to improve the following deficits and impairments:  Pain, Impaired flexibility, Decreased range of motion, Decreased strength, Increased muscle spasms, Decreased activity tolerance, Difficulty walking, Abnormal gait  Visit Diagnosis: Acute bilateral low back pain with left-sided sciatica  Acute pain of left knee  Stiffness of left knee, not elsewhere classified  Difficulty in walking, not elsewhere classified     Problem List Patient Active Problem List   Diagnosis Date Noted  . Palpitations 08/19/2015    JPercival Spanish PT, MPT 04/13/2016, 10:12 AM  [facility] [place] [address]Phone: [phone]  Fax:  [phone] Name: Brittany PENDLEYMRN: [id number] of Birth: 203/18/1952

## 2016-04-20 ENCOUNTER — Other Ambulatory Visit: Payer: Self-pay | Admitting: Orthopedic Surgery

## 2016-04-20 ENCOUNTER — Ambulatory Visit: Payer: BLUE CROSS/BLUE SHIELD | Admitting: Physical Therapy

## 2016-04-20 DIAGNOSIS — M545 Low back pain, unspecified: Secondary | ICD-10-CM

## 2016-04-20 DIAGNOSIS — M25662 Stiffness of left knee, not elsewhere classified: Secondary | ICD-10-CM

## 2016-04-20 DIAGNOSIS — M25562 Pain in left knee: Secondary | ICD-10-CM

## 2016-04-20 DIAGNOSIS — M5442 Lumbago with sciatica, left side: Secondary | ICD-10-CM | POA: Diagnosis not present

## 2016-04-20 DIAGNOSIS — R262 Difficulty in walking, not elsewhere classified: Secondary | ICD-10-CM

## 2016-04-20 NOTE — Therapy (Signed)
[facility] [address]  [place], [place], [address] Phone: [phone]   Fax:  [phone]  Physical Therapy Treatment  Patient Details  Name: Brittany Middleton MRN: [id number] Date of Birth: 12/17/1950 Referring Provider: Baldo Ash, PA-C  Encounter Date: 04/20/2016      PT End of Session - 04/20/16 1458    Visit Number 13   Number of Visits 16   Date for PT Re-Evaluation 04/30/16   PT Start Time 1458  Pt arrived late   PT Stop Time 1533   PT Time Calculation (min) 35 min   Activity Tolerance Patient tolerated treatment well   Behavior During Therapy [facility] for tasks assessed/performed      Past Medical History:  Diagnosis Date  . Hypercholesteremia   . Hypertension     Past Surgical History:  Procedure Laterality Date  . BLADDER REPAIR    . BREAST LUMPECTOMY Left   . CARPAL TUNNEL RELEASE Right   . CATARACT EXTRACTION, BILATERAL  2014  . REPAIR RECTOCELE      There were no vitals filed for this visit.      Subjective Assessment - 04/20/16 1501    Subjective Pt reports leg/knee pain is "much, much better". Has been able to resume swimming, but only able to swim 14 laps compared to her usual 28.   Patient Stated Goals "To help alleviate the discomfort"   Currently in Pain? Yes   Pain Score 3    Pain Location Back   Pain Score 2   Pain Location Leg   Pain Orientation Left;Upper;Lateral           Today's Treatment  TherEx NuStep - lvl 5 x 5' L HS stretch with strap targeting medial & lateral HS 2x30" each Frog Hip Adductor stretch 2x30" Standing L Hip Adductor stretch 2x30" BATCA Low Row 20# x10 BATCA Single Arm Row 15# x10 each TRX Squat x10 Counter Squat x10 Standing 3 way SLR reviewed (pt reports completing similar exercise on her own)           PT Short Term Goals - 03/11/16 1707      PT SHORT TERM GOAL #1   Title Complete LE assessment as pain levels allow &  establish goals/POC as appropriate by 03/05/16   Period Weeks   Status Achieved     PT SHORT TERM GOAL #2   Title Independent with initial HEP by 03/05/16   Status Achieved           PT Long Term Goals - 04/06/16 0910      PT LONG TERM GOAL #1   Title Independent with advanced HEP/gym program as indicated by 04/16/16   Status Partially Met  Met for current HEP     PT LONG TERM GOAL #2   Title Lumbar ROM WFL w/o pain or radicular symptoms by 04/16/16   Status Partially Met  ROM improving with remaining limitation due to tightness rather than pain     PT LONG TERM GOAL #3   Title Report overall reduction of low back/radicular LE and L knee pain >/= 75% for improved sleeping, sitting and standing tolerance by 04/16/16   Status Partially Met  low back/radicular pain 80-100% improved, L knee pain 40% improved - pt continues to note limited standing/walking tolerance mostly d/t L knee pain & weakness     PT LONG TERM GOAL #4   Title Ambulate with normal posture and gait pattern w/o  limitation due to LBP or L knee pain >/= 3/10 by 04/16/16   Status Partially Met  demostrates improved posture but continues to have limp d/t L knee pain               Plan - 04/20/16 1533    Clinical Impression Statement Pt reporting considerable improvement in L knee pain after taping and wanting to try progression to more upright core strengthening after review and clarification of new stretches added last visit. Pt demonstrating good tolerance for exercise progression. Pt with 3 visits remaining in current POC, therefore will assess need for recert for discharge over net few visits.   Rehab Potential Good   PT Treatment/Interventions Patient/family education;Therapeutic exercise;Neuromuscular re-education;Manual techniques;Dry needling;Taping;Electrical Stimulation;Moist Heat;Ultrasound;Cryotherapy;Vasopneumatic Device;Iontophoresis 70m/ml Dexamethasone;ADLs/Self Care Home Management   PT Next  Visit Plan LE flexibility; Lumbar stabilization & LE strengthening with standing/upright progression as tolerated; Manual therapy including TDN as indicated; Modaliities PRN for pain inlcuding ionto patch #4 of 6 if benefit noted; Progress HEP as tolerated   Consulted and Agree with Plan of Care Patient      Patient will benefit from skilled therapeutic intervention in order to improve the following deficits and impairments:  Pain, Impaired flexibility, Decreased range of motion, Decreased strength, Increased muscle spasms, Decreased activity tolerance, Difficulty walking, Abnormal gait  Visit Diagnosis: Acute bilateral low back pain with left-sided sciatica  Acute pain of left knee  Stiffness of left knee, not elsewhere classified  Difficulty in walking, not elsewhere classified     Problem List Patient Active Problem List   Diagnosis Date Noted  . Palpitations 08/19/2015    JPercival Spanish PT, MPT 04/20/2016, 5:00 PM  [facility] [place] [address]Phone: [phone]  Fax:  [phone] Name: Brittany OCCHIPINTIMRN: [id number] of Birth: 211-24-1952

## 2016-04-22 ENCOUNTER — Ambulatory Visit: Payer: BLUE CROSS/BLUE SHIELD | Attending: Physician Assistant | Admitting: Physical Therapy

## 2016-04-22 DIAGNOSIS — M5442 Lumbago with sciatica, left side: Secondary | ICD-10-CM | POA: Insufficient documentation

## 2016-04-22 DIAGNOSIS — M25562 Pain in left knee: Secondary | ICD-10-CM | POA: Insufficient documentation

## 2016-04-22 DIAGNOSIS — M25662 Stiffness of left knee, not elsewhere classified: Secondary | ICD-10-CM | POA: Diagnosis present

## 2016-04-22 DIAGNOSIS — R262 Difficulty in walking, not elsewhere classified: Secondary | ICD-10-CM | POA: Insufficient documentation

## 2016-04-22 NOTE — Therapy (Signed)
[facility] [address]  [place], [place], [address] Phone: [phone]   Fax:  [phone]  Physical Therapy Treatment  Patient Details  Name: Brittany Middleton MRN: [id number] Date of Birth: 1951/06/11 Referring Provider: Baldo Ash, PA-C  Encounter Date: 04/22/2016      PT End of Session - 04/22/16 0852    Visit Number 14   Number of Visits 16   Date for PT Re-Evaluation 04/30/16   PT Start Time 4765  pt arrived late   PT Stop Time 0930   PT Time Calculation (min) 38 min   Activity Tolerance Patient tolerated treatment well   Behavior During Therapy [facility] for tasks assessed/performed      Past Medical History:  Diagnosis Date  . Hypercholesteremia   . Hypertension     Past Surgical History:  Procedure Laterality Date  . BLADDER REPAIR    . BREAST LUMPECTOMY Left   . CARPAL TUNNEL RELEASE Right   . CATARACT EXTRACTION, BILATERAL  2014  . REPAIR RECTOCELE      There were no vitals filed for this visit.      Subjective Assessment - 04/22/16 0856    Subjective Pt reports she tried deep water aerobics yesterday and notes some increased pain in her L knee & piriformis after this.   Patient Stated Goals "To help alleviate the discomfort"   Currently in Pain? Yes   Pain Score 2    Pain Location Back   Pain Orientation Lower   Pain Score 3   Pain Location Knee   Pain Orientation Left           Today's Treatment  TherEx NuStep - lvl 6 x 6'  Manual STM/MFR & IASTM to L hip adductors, medial hamstrings & piriformis with TPR as indicated Manual stretching to the same  Modalities Ionto Patch (#4of 6) to L pes anserinus bursa - Dexamethasone 1 mL, 80 mA-min, 4-6 hr wear time             PT Short Term Goals - 03/11/16 1707      PT SHORT TERM GOAL #1   Title Complete LE assessment as pain levels allow & establish goals/POC as appropriate by 03/05/16   Period Weeks   Status  Achieved     PT SHORT TERM GOAL #2   Title Independent with initial HEP by 03/05/16   Status Achieved           PT Long Term Goals - 04/06/16 0910      PT LONG TERM GOAL #1   Title Independent with advanced HEP/gym program as indicated by 04/16/16   Status Partially Met  Met for current HEP     PT LONG TERM GOAL #2   Title Lumbar ROM WFL w/o pain or radicular symptoms by 04/16/16   Status Partially Met  ROM improving with remaining limitation due to tightness rather than pain     PT LONG TERM GOAL #3   Title Report overall reduction of low back/radicular LE and L knee pain >/= 75% for improved sleeping, sitting and standing tolerance by 04/16/16   Status Partially Met  low back/radicular pain 80-100% improved, L knee pain 40% improved - pt continues to note limited standing/walking tolerance mostly d/t L knee pain & weakness     PT LONG TERM GOAL #4   Title Ambulate with normal posture and gait pattern w/o limitation due to LBP or L knee pain >/=  3/10 by 04/16/16   Status Partially Met  demostrates improved posture but continues to have limp d/t L knee pain               Plan - 04/22/16 0904    Clinical Impression Statement Pt reporting increase in L knee & piriformis pain after trying deep water aerobics class. Increased muscle tension with significant ttp noted in L gracilis and to a lesser extent L semimembranosus, therefore targeted manual therapy to this area with some relief of pain noted. Pt continues to note tenderness over pes anserinus bursa, therefore applied ionto patch #4 here given prior positive reponse at greater trochanteric bursa. Given contiued flare-ups of pain and abnormal muscle tension with attempts to increase activity, anticipate pt may require recert at end of current POC.   Rehab Potential Good   PT Treatment/Interventions Patient/family education;Therapeutic exercise;Neuromuscular re-education;Manual techniques;Dry needling;Taping;Electrical  Stimulation;Moist Heat;Ultrasound;Cryotherapy;Vasopneumatic Device;Iontophoresis 73m/ml Dexamethasone;ADLs/Self Care Home Management   PT Next Visit Plan Assess reponse to ionto patch for L pes anserine; LE flexibility; Lumbar stabilization & LE strengthening with standing/upright progression as tolerated; Manual therapy including TDN as indicated; Modaliities PRN for pain inlcuding ionto patch #5 of 6 as continued benefit noted; Progress HEP as tolerated   Consulted and Agree with Plan of Care Patient      Patient will benefit from skilled therapeutic intervention in order to improve the following deficits and impairments:  Pain, Impaired flexibility, Decreased range of motion, Decreased strength, Increased muscle spasms, Decreased activity tolerance, Difficulty walking, Abnormal gait  Visit Diagnosis: Acute bilateral low back pain with left-sided sciatica  Acute pain of left knee  Stiffness of left knee, not elsewhere classified  Difficulty in walking, not elsewhere classified     Problem List Patient Active Problem List   Diagnosis Date Noted  . Palpitations 08/19/2015    JPercival Spanish PT, MPT 04/22/2016, 12:15 PM  [facility] [place] [address]Phone: [phone]  Fax:  [phone] Name: KLAREEN MULLINGSMRN: [id number] of Birth: [birth year]

## 2016-04-24 ENCOUNTER — Ambulatory Visit
Admission: RE | Admit: 2016-04-24 | Discharge: 2016-04-24 | Disposition: A | Discharge status: Home / Self Care | Payer: BLUE CROSS/BLUE SHIELD | Source: Ambulatory Visit | Attending: Orthopedic Surgery | Admitting: Orthopedic Surgery

## 2016-04-24 DIAGNOSIS — M545 Low back pain, unspecified: Secondary | ICD-10-CM

## 2016-04-27 ENCOUNTER — Ambulatory Visit: Payer: BLUE CROSS/BLUE SHIELD | Admitting: Physical Therapy

## 2016-04-27 DIAGNOSIS — M25562 Pain in left knee: Secondary | ICD-10-CM

## 2016-04-27 DIAGNOSIS — M25662 Stiffness of left knee, not elsewhere classified: Secondary | ICD-10-CM

## 2016-04-27 DIAGNOSIS — M5442 Lumbago with sciatica, left side: Secondary | ICD-10-CM

## 2016-04-27 DIAGNOSIS — R262 Difficulty in walking, not elsewhere classified: Secondary | ICD-10-CM

## 2016-04-27 NOTE — Therapy (Signed)
[facility] [address]  [place], [place], [address] Phone: [phone]   Fax:  [phone]  Physical Therapy Treatment  Patient Details  Name: Brittany Middleton MRN: [id number] Date of Birth: [birth year] Referring Provider: Baldo Ash, PA-C  Encounter Date: 04/27/2016      PT End of Session - 04/27/16 0851    Visit Number 15   Number of Visits 16   Date for PT Re-Evaluation 04/30/16   PT Start Time 0851  Pt arrived late   PT Stop Time 0931   PT Time Calculation (min) 40 min   Activity Tolerance Patient tolerated treatment well   Behavior During Therapy [facility] for tasks assessed/performed      Past Medical History:  Diagnosis Date  . Hypercholesteremia   . Hypertension     Past Surgical History:  Procedure Laterality Date  . BLADDER REPAIR    . BREAST LUMPECTOMY Left   . CARPAL TUNNEL RELEASE Right   . CATARACT EXTRACTION, BILATERAL  2014  . REPAIR RECTOCELE      There were no vitals filed for this visit.      Subjective Assessment - 04/27/16 0856    Subjective Pt reports she had been doing very well over the weekend, able to play with her 34 mo old grandson and clean out her car which she has not been able to do for a while. Yesterday while taking her recycling out she tripped over the dog lead falling foward, landing on her L knee. Reports some soreness and bruising from the fall, but does not feel it has greatly exacerbated her normal pain.   Patient Stated Goals "To help alleviate the discomfort"   Currently in Pain? Yes   Pain Score --  2-3/10   Pain Location Back   Pain Orientation Lower   Pain Score --  2-3/10   Pain Location Knee   Pain Orientation Left   Pain Descriptors / Indicators --  Bruised          Today's Treatment  TherEx NuStep - lvl 6 x 6'  Manual STM/MFR & IASTM to L hip adductors, medial hamstrings & piriformis with TPR as indicated Manual stretching to the  same  Kinesiotaping Lknee pes anserine - 3 strips - 30% along sartorius, gracilis & medial HS muscles, 50% horizontally over pes anserineextending to medial knee           PT Short Term Goals - 03/11/16 1707      PT SHORT TERM GOAL #1   Title Complete LE assessment as pain levels allow & establish goals/POC as appropriate by 03/05/16   Period Weeks   Status Achieved     PT SHORT TERM GOAL #2   Title Independent with initial HEP by 03/05/16   Status Achieved           PT Long Term Goals - 04/06/16 0910      PT LONG TERM GOAL #1   Title Independent with advanced HEP/gym program as indicated by 04/16/16   Status Partially Met  Met for current HEP     PT LONG TERM GOAL #2   Title Lumbar ROM WFL w/o pain or radicular symptoms by 04/16/16   Status Partially Met  ROM improving with remaining limitation due to tightness rather than pain     PT LONG TERM GOAL #3   Title Report overall reduction of low back/radicular LE and L knee pain >/= 75% for improved sleeping,  sitting and standing tolerance by 04/16/16   Status Partially Met  low back/radicular pain 80-100% improved, L knee pain 40% improved - pt continues to note limited standing/walking tolerance mostly d/t L knee pain & weakness     PT LONG TERM GOAL #4   Title Ambulate with normal posture and gait pattern w/o limitation due to LBP or L knee pain >/= 3/10 by 04/16/16   Status Partially Met  demostrates improved posture but continues to have limp d/t L knee pain               Plan - 04/27/16 0931    Clinical Impression Statement Pt noting no difference after ionto patch last visit but reporting good relief of pain over the weekend allowing her to complete activities that she had been previously unable to do due to the pain, however pt fell last evening resulting in new trauma to L knee with increased muscle guarding in areas of previous increased tension in adductors and medial HS. Continued focus on STM to  these muscles and completed session with kinesiotaping to further promote muscle relaxation and reduce pain. One visit remaining in current POC, therefore will assess need for recert vs discharge with transition to HEP   Rehab Potential Good   PT Treatment/Interventions Patient/family education;Therapeutic exercise;Neuromuscular re-education;Manual techniques;Dry needling;Taping;Electrical Stimulation;Moist Heat;Ultrasound;Cryotherapy;Vasopneumatic Device;Iontophoresis 52m/ml Dexamethasone;ADLs/Self Care Home Management   PT Next Visit Plan Discharge vs Recert for continued LE flexibility; Lumbar stabilization & LE strengthening with standing/upright progression as tolerated; Manual therapy including TDN as indicated; Modaliities PRN for pain inlcuding ionto patch #5 of 6 as continued benefit noted; Progress HEP as tolerated   Consulted and Agree with Plan of Care Patient      Patient will benefit from skilled therapeutic intervention in order to improve the following deficits and impairments:  Pain, Impaired flexibility, Decreased range of motion, Decreased strength, Increased muscle spasms, Decreased activity tolerance, Difficulty walking, Abnormal gait  Visit Diagnosis: Acute bilateral low back pain with left-sided sciatica  Acute pain of left knee  Stiffness of left knee, not elsewhere classified  Difficulty in walking, not elsewhere classified     Problem List Patient Active Problem List   Diagnosis Date Noted  . Palpitations 08/19/2015    JPercival Spanish PT, MPT 04/27/2016, 11:01 AM  [facility] [place] [address]Phone: [phone]  Fax:  [phone] Name: Brittany MARULANDAMRN: [id number] of Birth: 2Sep 30, 1952

## 2016-04-28 ENCOUNTER — Ambulatory Visit: Payer: BLUE CROSS/BLUE SHIELD | Admitting: Physical Therapy

## 2016-04-28 DIAGNOSIS — R262 Difficulty in walking, not elsewhere classified: Secondary | ICD-10-CM

## 2016-04-28 DIAGNOSIS — M5442 Lumbago with sciatica, left side: Secondary | ICD-10-CM

## 2016-04-28 DIAGNOSIS — M25662 Stiffness of left knee, not elsewhere classified: Secondary | ICD-10-CM

## 2016-04-28 DIAGNOSIS — M25562 Pain in left knee: Secondary | ICD-10-CM

## 2016-04-28 NOTE — Therapy (Signed)
[facility] [address]  [place], [place], [address] Phone: [phone]   Fax:  [phone]  Physical Therapy Treatment  Patient Details  Name: Brittany Middleton MRN: [id number] Date of Birth: Nov 12, 1950 Referring Provider: Baldo Ash, PA-C  Encounter Date: 04/28/2016      PT End of Session - 04/28/16 1010    Visit Number 16   Number of Visits 28   Date for PT Re-Evaluation 06/11/16   PT Start Time 0935   PT Stop Time 1026   PT Time Calculation (min) 51 min   Activity Tolerance Patient tolerated treatment well   Behavior During Therapy [facility] for tasks assessed/performed      Past Medical History:  Diagnosis Date  . Hypercholesteremia   . Hypertension     Past Surgical History:  Procedure Laterality Date  . BLADDER REPAIR    . BREAST LUMPECTOMY Left   . CARPAL TUNNEL RELEASE Right   . CATARACT EXTRACTION, BILATERAL  2014  . REPAIR RECTOCELE      There were no vitals filed for this visit.      Subjective Assessment - 04/28/16 0943    Subjective Pt reports MD gave her new order to continue PT but left the sheet at home. Pt noting increased stiffness along with increased LBP since fall earlier this week. Pt continues to report pain in back and knee is worst at night, causing her to have to frequently change position.   Limitations Sitting;Standing   How long can you sit comfortably? <5 minutes   How long can you stand comfortably? 4-5 minutes   How long can you walk comfortably? 2 blocks   Diagnostic tests 04/24/16 - Lumbar MRI: Multilevel lumbar disc and facet degeneration, most notable at L4-5 where there is moderate spinal, left lateral recess, and left neural foraminal stenosis.   Patient Stated Goals "To help alleviate the discomfort"   Currently in Pain? Yes   Pain Score 4    Pain Location Back   Pain Orientation Lower   Pain Descriptors / Indicators Sharp;Radiating   Pain Type Acute  pain;Chronic pain   Pain Onset More than a month ago   Pain Score --  2-3/10   Pain Location Knee   Pain Orientation Left   Pain Descriptors / Indicators Dull;Aching   Pain Type Acute pain   Pain Onset More than a month ago            [facility] PT Assessment - 04/28/16 0935      Assessment   Medical Diagnosis LBP with L LE sciatica & L knee pain   Referring Provider Josh Chadwell, PA-C     AROM   AROM Assessment Site Lumbar;Knee   Right Knee Extension 0   Right Knee Flexion 130   Left Knee Extension 8   Left Knee Flexion 111   Lumbar Flexion hands to ankles - tight, but no increased pain   Lumbar Extension 30%   Lumbar - Right Side Bend hand to mid thigh - tight in L LB & lat thigh   Lumbar - Left Side Bend hand to mid thigh - tight w/ mild pain   Lumbar - Right Rotation 50% - increases pain   Lumbar - Left Rotation WFL - no increased pain     Strength   Strength Assessment Site Hip;Knee   Right Hip Flexion 4+/5   Right Hip Extension 4/5   Right Hip ABduction 4+/5   Right  Hip ADduction 4/5   Left Hip Flexion 4-/5   Left Hip Extension 4/5  pain in upper leg with resistance   Left Hip ABduction 4/5   Left Hip ADduction 3+/5   Right Knee Flexion 5/5   Right Knee Extension 5/5   Left Knee Flexion 4+/5   Left Knee Extension 4/5  pain                 [facility] Adult PT Treatment/Exercise - 04/28/16 0935      Exercises   Exercises Lumbar;Knee/Hip     Knee/Hip Exercises: Aerobic   Nustep lvl 6 x 6'     Manual Therapy   Manual Therapy Taping   Kinesiotex Inhibit Muscle     Kinesiotix   Inhibit Muscle  L knee pes anserine - 4 strips - 30% from tibial tuberosity to lateral quads, 30% along gracilis & medial HS muscles, 50% horizontally over pes anserine extending to medial knee                  PT Short Term Goals - 03/11/16 1707      PT SHORT TERM GOAL #1   Title Complete LE assessment as pain levels allow & establish goals/POC as appropriate  by 03/05/16   Period Weeks   Status Achieved     PT SHORT TERM GOAL #2   Title Independent with initial HEP by 03/05/16   Status Achieved           PT Long Term Goals - 04/28/16 0948      PT LONG TERM GOAL #1   Title Independent with advanced HEP/gym program as indicated by 06/11/16   Status Partially Met  Met for current HEP     PT LONG TERM GOAL #2   Title Lumbar ROM WFL w/o pain or radicular symptoms by 06/11/16   Status Partially Met  ROM has decreased since fall due to increased pain & stiffness/tightness, but no radicular pain beyond buttock     PT LONG TERM GOAL #3   Title Report overall reduction of low back/radicular LE and L knee pain >/= 75% for improved sleeping, sitting and standing tolerance by 06/11/16   Status Partially Met  low back/radicular pain 40% improved, L knee pain 75% improved - LBP has worsened since recent fall earlier this week     PT LONG TERM GOAL #4   Title Ambulate with normal posture and gait pattern w/o limitation due to LBP or L knee pain >/= 3/10 by 06/11/16   Status Partially Met  demostrates improved posture but limp decreasing (had resolved prior to recent fall)     PT LONG TERM GOAL #5   Title Pt will report ability to sleep for a minimum of 4 hrs w/o limitation d/t LBP or L knee pain by 06/11/16   Status New     PT LONG TERM GOAL #6   Title Pt will report ability to sit for >/= 20 minutes w/o limitation d/t LBP by 06/11/16   Status New               Plan - 04/28/16 1026    Clinical Impression Statement Pt has demonstrated good progress with PT with signicant improvement in L knee pain and low back pain with near resolution of radicular symptoms, however pt sustained a fall 2 days ago which exacerbated her low back pain. Lumbar ROM had improved but now more limited s/p fall. L knee ROM remains limited in both flexion  and extension, but overall hip and knee strength improved. LTG's remain ongoing or partially met and given  recent flare-up earlier this week due to fall feel pt would benefit from continued PT for core and proximal flexibility and strengthening, therefore will plan to recert for additional 2x/wk for 6 wks.   Rehab Potential Good   PT Frequency 2x / week   PT Duration 6 weeks   PT Treatment/Interventions Patient/family education;Therapeutic exercise;Neuromuscular re-education;Manual techniques;Dry needling;Taping;Electrical Stimulation;Moist Heat;Ultrasound;Cryotherapy;Vasopneumatic Device;Iontophoresis 6m/ml Dexamethasone;Gait training;ADLs/Self Care Home Management   PT Next Visit Plan Continue LE flexibility; Lumbar stabilization & LE strengthening with standing/upright progression as tolerated; Manual therapy including TDN as indicated; Modaliities PRN for pain inlcuding ionto patch #5 of 6 as continued benefit noted; Progress HEP as tolerated   Consulted and Agree with Plan of Care Patient      Patient will benefit from skilled therapeutic intervention in order to improve the following deficits and impairments:  Pain, Impaired flexibility, Decreased range of motion, Decreased strength, Increased muscle spasms, Decreased activity tolerance, Difficulty walking, Abnormal gait  Visit Diagnosis: Acute bilateral low back pain with left-sided sciatica  Acute pain of left knee  Stiffness of left knee, not elsewhere classified  Difficulty in walking, not elsewhere classified     Problem List Patient Active Problem List   Diagnosis Date Noted  . Palpitations 08/19/2015    JPercival Spanish PT, MPT 04/28/2016, 12:38 PM  [facility] [place] [address]Phone: [phone]  Fax:  [phone] Name: Brittany SLIGHTMRN: [id number] of Birth: 2May 03, 1952

## 2016-04-29 ENCOUNTER — Encounter: Payer: BLUE CROSS/BLUE SHIELD | Admitting: Physical Therapy

## 2016-05-04 ENCOUNTER — Ambulatory Visit: Payer: BLUE CROSS/BLUE SHIELD | Admitting: Physical Therapy

## 2016-05-04 DIAGNOSIS — M5442 Lumbago with sciatica, left side: Secondary | ICD-10-CM

## 2016-05-04 DIAGNOSIS — R262 Difficulty in walking, not elsewhere classified: Secondary | ICD-10-CM

## 2016-05-04 DIAGNOSIS — M25662 Stiffness of left knee, not elsewhere classified: Secondary | ICD-10-CM

## 2016-05-04 DIAGNOSIS — M25562 Pain in left knee: Secondary | ICD-10-CM

## 2016-05-04 NOTE — Therapy (Signed)
[facility] [address]  [place], [place], [address] Phone: [phone]   Fax:  [phone]  Physical Therapy Treatment  Patient Details  Name: Brittany Middleton MRN: [id number] Date of Birth: 04/29/1951 Referring Provider: Baldo Ash, PA-C  Encounter Date: 05/04/2016      PT End of Session - 05/04/16 1402    Visit Number 17   Number of Visits 28   Date for PT Re-Evaluation 06/11/16   PT Start Time 6144   PT Stop Time 1458   PT Time Calculation (min) 56 min   Activity Tolerance Patient tolerated treatment well   Behavior During Therapy [facility] for tasks assessed/performed      Past Medical History:  Diagnosis Date  . Hypercholesteremia   . Hypertension     Past Surgical History:  Procedure Laterality Date  . BLADDER REPAIR    . BREAST LUMPECTOMY Left   . CARPAL TUNNEL RELEASE Right   . CATARACT EXTRACTION, BILATERAL  2014  . REPAIR RECTOCELE      There were no vitals filed for this visit.      Subjective Assessment - 05/04/16 1408    Subjective Pt reports taping "was wonderful" and knee is feeling better. Was able to travel by plane to [place] and spent the weekend setting up holiday decor at a friend's house so was on her feet a lot. Back still fatigued easily but able to manage pain with TENS and Biofreeze, with pain no more than 2-3/10.   Limitations Sitting;Standing   How long can you sit comfortably? <5 minutes   How long can you stand comfortably? 4-5 minutes   How long can you walk comfortably? 2 blocks   Diagnostic tests 04/24/16 - Lumbar MRI: Multilevel lumbar disc and facet degeneration, most notable at L4-5 where there is moderate spinal, left lateral recess, and left neural foraminal stenosis.   Patient Stated Goals "To help alleviate the discomfort"   Currently in Pain? Yes   Pain Score 2    Pain Location Back   Pain Orientation Lower   Pain Onset More than a month ago   Pain Onset  More than a month ago                 [facility] Adult PT Treatment/Exercise - 05/04/16 1402      Self-Care   Self-Care Posture   Posture Provided handout and reviewed good posture and body mechanics with typical daily tasks     Lumbar Exercises: Aerobic   Tread Mill 1.9 mph x 5'     Lumbar Exercises: Supine   Ab Set 10 reps;5 seconds   Clam 10 reps;3 seconds   Clam Limitations Hooklying Alt Hip ABD/ER with green TB  noting sharp pain medial joint line of L knee by end of set   Bent Knee Raise 10 reps;3 seconds   Bent Knee Raise Limitations Heels   Bridge 10 reps;3 seconds   Bridge Limitations green TB B Hip ABD isometric   Large Ball Oblique Isometric 10 reps;3 seconds   Large Ball Oblique Isometric Limitations LTR with lower legs on orange (55 cm) Pball     Knee/Hip Exercises: Stretches   Gastroc Stretch Left;3 reps;30 seconds                PT Education - 05/04/16 1454    Education Details Posture & body mechanics education, gastroc stretch   Person(s) Educated Patient   Methods Explanation;Demonstration;Handout  Comprehension Verbalized understanding;Returned demonstration;Need further instruction          PT Short Term Goals - 03/11/16 1707      PT SHORT TERM GOAL #1   Title Complete LE assessment as pain levels allow & establish goals/POC as appropriate by 03/05/16   Period Weeks   Status Achieved     PT SHORT TERM GOAL #2   Title Independent with initial HEP by 03/05/16   Status Achieved           PT Long Term Goals - 05/04/16 1522      PT LONG TERM GOAL #1   Title Independent with advanced HEP/gym program as indicated by 06/11/16   Status Partially Met  Met for current HEP     PT LONG TERM GOAL #2   Title Lumbar ROM WFL w/o pain or radicular symptoms by 06/11/16   Status Partially Met  ROM has decreased since fall due to increased pain & stiffness/tightness, but no radicular pain beyond buttock     PT LONG TERM GOAL #3   Title  Report overall reduction of low back/radicular LE and L knee pain >/= 75% for improved sleeping, sitting and standing tolerance by 06/11/16   Status Partially Met  low back/radicular pain 40% improved, L knee pain 75% improved - LBP has worsened since recent fall earlier this week     PT LONG TERM GOAL #4   Title Ambulate with normal posture and gait pattern w/o limitation due to LBP or L knee pain >/= 3/10 by 06/11/16   Status Partially Met  demostrates improved posture but limp decreasing (had resolved prior to recent fall)     PT LONG TERM GOAL #5   Title Pt will report ability to sleep for a minimum of 4 hrs w/o limitation d/t LBP or L knee pain by 06/11/16   Status On-going     PT LONG TERM GOAL #6   Title Pt will report ability to sit for >/= 20 minutes w/o limitation d/t LBP by 06/11/16   Status On-going               Plan - 05/04/16 1412    Clinical Impression Statement Knee pain better controlled even with traveling over the weekend, therefore shifting focus back to low back targeting lumbar stabilization again today, however still noting some medial joint line pain in L knee with hip abduction & ER which continues to appear realted to hip adductot tightness most likely in adductor magnus. Also there may be a component of gastroc tightness contributing to knee pain, therefore instructed in gastroc stretch. Treatment session completed with education provided for good posture and body mechanics.   Rehab Potential Good   PT Frequency 2x / week   PT Duration 6 weeks   PT Treatment/Interventions Patient/family education;Therapeutic exercise;Neuromuscular re-education;Manual techniques;Dry needling;Taping;Electrical Stimulation;Moist Heat;Ultrasound;Cryotherapy;Vasopneumatic Device;Iontophoresis 59m/ml Dexamethasone;Gait training;ADLs/Self Care Home Management   PT Next Visit Plan Continue LE flexibility; Lumbar stabilization & LE strengthening with standing/upright progression as  tolerated; Manual therapy including TDN as indicated; Modaliities PRN for pain inlcuding ionto patch #5 of 6 as continued benefit noted; Progress HEP as tolerated   Consulted and Agree with Plan of Care Patient      Patient will benefit from skilled therapeutic intervention in order to improve the following deficits and impairments:  Pain, Impaired flexibility, Decreased range of motion, Decreased strength, Increased muscle spasms, Decreased activity tolerance, Difficulty walking, Abnormal gait  Visit Diagnosis: Acute bilateral low back pain  with left-sided sciatica  Acute pain of left knee  Stiffness of left knee, not elsewhere classified  Difficulty in walking, not elsewhere classified     Problem List Patient Active Problem List   Diagnosis Date Noted  . Palpitations 08/19/2015    Percival Spanish, PT, MPT 05/04/2016, 3:22 PM  [facility] [address]  [place], [place], [address] Phone: [phone]   Fax:  [phone]  Name: Brittany Middleton MRN: [id number] Date of Birth: 23-Mar-1951

## 2016-05-04 NOTE — Patient Instructions (Signed)

## 2016-05-06 ENCOUNTER — Ambulatory Visit: Payer: BLUE CROSS/BLUE SHIELD | Admitting: Physical Therapy

## 2016-05-06 DIAGNOSIS — M5442 Lumbago with sciatica, left side: Secondary | ICD-10-CM | POA: Diagnosis not present

## 2016-05-06 DIAGNOSIS — R262 Difficulty in walking, not elsewhere classified: Secondary | ICD-10-CM

## 2016-05-06 DIAGNOSIS — M25562 Pain in left knee: Secondary | ICD-10-CM

## 2016-05-06 DIAGNOSIS — M25662 Stiffness of left knee, not elsewhere classified: Secondary | ICD-10-CM

## 2016-05-06 NOTE — Therapy (Signed)
[facility] [address]  [place], [place], [address] Phone: [phone]   Fax:  [phone]  Physical Therapy Treatment  Patient Details  Name: Brittany Middleton MRN: [id number] Date of Birth: 07-25-1950 Referring Provider: Baldo Ash, PA-C  Encounter Date: 05/06/2016      PT End of Session - 05/06/16 0933    Visit Number 18   Number of Visits 28   Date for PT Re-Evaluation 06/11/16   PT Start Time 0933   PT Stop Time 1014   PT Time Calculation (min) 41 min   Activity Tolerance Patient tolerated treatment well   Behavior During Therapy [facility] for tasks assessed/performed      Past Medical History:  Diagnosis Date  . Hypercholesteremia   . Hypertension     Past Surgical History:  Procedure Laterality Date  . BLADDER REPAIR    . BREAST LUMPECTOMY Left   . CARPAL TUNNEL RELEASE Right   . CATARACT EXTRACTION, BILATERAL  2014  . REPAIR RECTOCELE      There were no vitals filed for this visit.      Subjective Assessment - 05/06/16 0938    Subjective Pt reports more stiffness in low back than pain this morning. Pain/stiffness still disrupting sleep but to a lesser degree with pt reporting up to 4 hrs before needing to reposition (previously closer to every 2 hours).   Limitations Sitting;Standing   How long can you sit comfortably? <5 minutes   How long can you stand comfortably? 4-5 minutes   How long can you walk comfortably? 2 blocks   Diagnostic tests 04/24/16 - Lumbar MRI: Multilevel lumbar disc and facet degeneration, most notable at L4-5 where there is moderate spinal, left lateral recess, and left neural foraminal stenosis.   Patient Stated Goals "To help alleviate the discomfort"   Currently in Pain? No/denies   Pain Onset More than a month ago   Pain Onset More than a month ago                  [facility] Adult PT Treatment/Exercise - 05/06/16 0933      Lumbar Exercises: Aerobic   Tread Mill 2.1 mph x 5'     Lumbar Exercises: Standing   Row Both;10 reps;Theraband   Theraband Level (Row) Level 3 (Green)   Row Limitations seated on green (65 cm) Pball   Shoulder Extension Both;10 reps;Limitations   Theraband Level (Shoulder Extension) Level 3 (Green)   Shoulder Extension Limitations seated on green (65 cm) Pball     Lumbar Exercises: Seated   Long Arc Quad on [place] Both;10 reps   LAQ on [place] Limitations green (65 cm) Pball   Hip Flexion on Ball Both;10 reps   Hip Flexion on Ball Limitations green (65 cm) Pball   Other Seated Lumbar Exercises TrA + chest press with 4# db seated on green (65 cm) Pball x10   Other Seated Lumbar Exercises Oblique trunk rotation with 3# held at 90 dg shoulder flexion seated on green (65 cm) Pball     Lumbar Exercises: Supine   Ab Set 10 reps;3 seconds   AB Set Limitations seated on green (65 cm) Pball   Bent Knee Raise 5 seconds  6 reps   Other Supine Lumbar Exercises Modified dead bug x10 (hooklying w/ feet resting on mat rather than in bent knee position)     Lumbar Exercises: Prone   Straight Leg Raise 10 reps;3 seconds  PT Short Term Goals - 03/11/16 1707      PT SHORT TERM GOAL #1   Title Complete LE assessment as pain levels allow & establish goals/POC as appropriate by 03/05/16   Period Weeks   Status Achieved     PT SHORT TERM GOAL #2   Title Independent with initial HEP by 03/05/16   Status Achieved           PT Long Term Goals - 05/04/16 1522      PT LONG TERM GOAL #1   Title Independent with advanced HEP/gym program as indicated by 06/11/16   Status Partially Met  Met for current HEP     PT LONG TERM GOAL #2   Title Lumbar ROM WFL w/o pain or radicular symptoms by 06/11/16   Status Partially Met  ROM has decreased since fall due to increased pain & stiffness/tightness, but no radicular pain beyond buttock     PT LONG TERM GOAL #3   Title Report overall reduction of low  back/radicular LE and L knee pain >/= 75% for improved sleeping, sitting and standing tolerance by 06/11/16   Status Partially Met  low back/radicular pain 40% improved, L knee pain 75% improved - LBP has worsened since recent fall earlier this week     PT LONG TERM GOAL #4   Title Ambulate with normal posture and gait pattern w/o limitation due to LBP or L knee pain >/= 3/10 by 06/11/16   Status Partially Met  demostrates improved posture but limp decreasing (had resolved prior to recent fall)     PT LONG TERM GOAL #5   Title Pt will report ability to sleep for a minimum of 4 hrs w/o limitation d/t LBP or L knee pain by 06/11/16   Status On-going     PT LONG TERM GOAL #6   Title Pt will report ability to sit for >/= 20 minutes w/o limitation d/t LBP by 06/11/16   Status On-going               Plan - 05/06/16 0944    Clinical Impression Statement Pt reporting more stiffness than pain at present and wanting to progress lumbar/core strengthening but limited by cormorbities of knee and shoulder issues limiting positional tolerance in prone and preventing quadruped positioning, therefore introduced seated activities on theraball with good tolerance other than some shoulder discomfort noted with UE actitvities on ball.   Rehab Potential Good   PT Frequency 2x / week   PT Duration 6 weeks   PT Treatment/Interventions Patient/family education;Therapeutic exercise;Neuromuscular re-education;Manual techniques;Dry needling;Taping;Electrical Stimulation;Moist Heat;Ultrasound;Cryotherapy;Vasopneumatic Device;Iontophoresis 1m/ml Dexamethasone;Gait training;ADLs/Self Care Home Management   PT Next Visit Plan Continue LE flexibility; Lumbar stabilization & LE strengthening with standing/upright progression as tolerated; Manual therapy including TDN as indicated; Modaliities PRN for pain inlcuding ionto patch #5 of 6 as continued benefit noted; Progress HEP as tolerated   Consulted and Agree with  Plan of Care Patient      Patient will benefit from skilled therapeutic intervention in order to improve the following deficits and impairments:  Pain, Impaired flexibility, Decreased range of motion, Decreased strength, Increased muscle spasms, Decreased activity tolerance, Difficulty walking, Abnormal gait  Visit Diagnosis: Acute bilateral low back pain with left-sided sciatica  Acute pain of left knee  Stiffness of left knee, not elsewhere classified  Difficulty in walking, not elsewhere classified     Problem List Patient Active Problem List   Diagnosis Date Noted  . Palpitations 08/19/2015  Percival Spanish, PT, MPT 05/06/2016, 2:34 PM  [facility] [address]  [place], [place], [address] Phone: [phone]   Fax:  [phone]  Name: Brittany Middleton MRN: [id number] Date of Birth: 23-Apr-1951

## 2016-05-10 ENCOUNTER — Ambulatory Visit: Payer: BLUE CROSS/BLUE SHIELD | Admitting: Physical Therapy

## 2016-05-10 DIAGNOSIS — M5442 Lumbago with sciatica, left side: Secondary | ICD-10-CM | POA: Diagnosis not present

## 2016-05-10 DIAGNOSIS — M25662 Stiffness of left knee, not elsewhere classified: Secondary | ICD-10-CM

## 2016-05-10 DIAGNOSIS — M25562 Pain in left knee: Secondary | ICD-10-CM

## 2016-05-10 DIAGNOSIS — R262 Difficulty in walking, not elsewhere classified: Secondary | ICD-10-CM

## 2016-05-10 NOTE — Therapy (Signed)
[facility] [address]  [place], [place], [address] Phone: [phone]   Fax:  [phone]  Physical Therapy Treatment  Patient Details  Name: Brittany Middleton MRN: [id number] Date of Birth: 22-Feb-1951 Referring Provider: Baldo Ash, PA-C  Encounter Date: 05/10/2016      PT End of Session - 05/10/16 0932    Visit Number 19   Number of Visits 28   Date for PT Re-Evaluation 06/11/16   PT Start Time 0932   PT Stop Time 1019   PT Time Calculation (min) 47 min   Activity Tolerance Patient tolerated treatment well   Behavior During Therapy [facility] for tasks assessed/performed      Past Medical History:  Diagnosis Date  . Hypercholesteremia   . Hypertension     Past Surgical History:  Procedure Laterality Date  . BLADDER REPAIR    . BREAST LUMPECTOMY Left   . CARPAL TUNNEL RELEASE Right   . CATARACT EXTRACTION, BILATERAL  2014  . REPAIR RECTOCELE      There were no vitals filed for this visit.      Subjective Assessment - 05/10/16 0938    Subjective Pt reports lower back feels tight today, but knee is much better. Went to [place] on Sat and able to stand and walk around w/o having to favor L knee.   Limitations Sitting;Standing   How long can you sit comfortably? <5 minutes   How long can you stand comfortably? 4-5 minutes   How long can you walk comfortably? 2 blocks   Diagnostic tests 04/24/16 - Lumbar MRI: Multilevel lumbar disc and facet degeneration, most notable at L4-5 where there is moderate spinal, left lateral recess, and left neural foraminal stenosis.   Patient Stated Goals "To help alleviate the discomfort"   Currently in Pain? Yes   Pain Score --  3-4/10   Pain Location Back   Pain Orientation Lower   Pain Descriptors / Indicators --  "stiffness"   Pain Onset More than a month ago   Pain Score 1   Pain Location Knee   Pain Orientation Left   Pain Onset More than a month  ago                   [facility] Adult PT Treatment/Exercise - 05/10/16 0932      Lumbar Exercises: Stretches   Quadruped Mid Back Stretch 2 reps;30 seconds   Quadruped Mid Back Stretch Limitations seated 3 way prayer stretch with green (65 cm) Pball     Lumbar Exercises: Standing   Row Both;10 reps;Theraband   Theraband Level (Row) Level 4 (Blue)   Row Limitations seated on green (65 cm) Pball   Shoulder Extension Both;10 reps;Limitations   Theraband Level (Shoulder Extension) Level 4 (Blue)   Shoulder Extension Limitations seated on green (65 cm) Pball     Lumbar Exercises: Seated   Long Arc Quad on [employer] Both;15 reps   LAQ on [place] Limitations green (65 cm) Pball   Hip Flexion on Ball Both;15 reps  + opp shoulder flexion   Hip Flexion on Ball Limitations green (65 cm) Pball   Other Seated Lumbar Exercises TrA + chest press with 3# db seated on green (65 cm) Pball x10   Other Seated Lumbar Exercises Oblique trunk rotation with green TB single UE row, seated on green (65 cm) Pball     Lumbar Exercises: Supine   Ab Set 10 reps;3 seconds  AB Set Limitations seated on green (65 cm) Pball   Bent Knee Raise 15 reps;3 seconds   Bent Knee Raise Limitations DKTC with heels on orange (55 cm) Pball    Large Ball Oblique Isometric 10 reps;3 seconds   Large Ball Oblique Isometric Limitations LTR with lower legs on orange (55 cm) Pball   Other Supine Lumbar Exercises Modified dead bug 10x3" (hooklying w/ feet resting on mat rather than in bent knee position)     Knee/Hip Exercises: Aerobic   Nustep lvl 6 x 5'                PT Education - 05/10/16 1015    Education provided Yes   Education Details Current physioball exercises   Person(s) Educated Patient   Methods Explanation;Demonstration;Handout   Comprehension Verbalized understanding;Returned demonstration          PT Short Term Goals - 03/11/16 1707      PT SHORT TERM GOAL #1   Title Complete LE assessment  as pain levels allow & establish goals/POC as appropriate by 03/05/16   Period Weeks   Status Achieved     PT SHORT TERM GOAL #2   Title Independent with initial HEP by 03/05/16   Status Achieved           PT Long Term Goals - 05/04/16 1522      PT LONG TERM GOAL #1   Title Independent with advanced HEP/gym program as indicated by 06/11/16   Status Partially Met  Met for current HEP     PT LONG TERM GOAL #2   Title Lumbar ROM WFL w/o pain or radicular symptoms by 06/11/16   Status Partially Met  ROM has decreased since fall due to increased pain & stiffness/tightness, but no radicular pain beyond buttock     PT LONG TERM GOAL #3   Title Report overall reduction of low back/radicular LE and L knee pain >/= 75% for improved sleeping, sitting and standing tolerance by 06/11/16   Status Partially Met  low back/radicular pain 40% improved, L knee pain 75% improved - LBP has worsened since recent fall earlier this week     PT LONG TERM GOAL #4   Title Ambulate with normal posture and gait pattern w/o limitation due to LBP or L knee pain >/= 3/10 by 06/11/16   Status Partially Met  demostrates improved posture but limp decreasing (had resolved prior to recent fall)     PT LONG TERM GOAL #5   Title Pt will report ability to sleep for a minimum of 4 hrs w/o limitation d/t LBP or L knee pain by 06/11/16   Status On-going     PT LONG TERM GOAL #6   Title Pt will report ability to sit for >/= 20 minutes w/o limitation d/t LBP by 06/11/16   Status On-going               Plan - 05/10/16 0932    Clinical Impression Statement Pt able to tolerate increased standing and walking over weekend w/o limitations from knee or low back, but continues to note increased sensation of "stiffness" in the low back especially in the sacral region. Therapy progression for lumabr spine continues to be limited be cormorbities of neck/shoulder issues as well as knee issues but pt seems to tolerate  physioball activities with only limited interference from comorbidities. Pt requesting instructions for home carryover of these activities.   Rehab Potential Good   PT Frequency 2x /  week   PT Duration 6 weeks   PT Treatment/Interventions Patient/family education;Therapeutic exercise;Neuromuscular re-education;Manual techniques;Dry needling;Taping;Electrical Stimulation;Moist Heat;Ultrasound;Cryotherapy;Vasopneumatic Device;Iontophoresis 13m/ml Dexamethasone;Gait training;ADLs/Self Care Home Management   PT Next Visit Plan Continue LE flexibility; Lumbar stabilization & LE strengthening with standing/upright progression as tolerated; Manual therapy including TDN as indicated; Modaliities PRN for pain inlcuding ionto patch #5 of 6 as continued benefit noted; Progress HEP as tolerated   Consulted and Agree with Plan of Care Patient      Patient will benefit from skilled therapeutic intervention in order to improve the following deficits and impairments:  Pain, Impaired flexibility, Decreased range of motion, Decreased strength, Increased muscle spasms, Decreased activity tolerance, Difficulty walking, Abnormal gait  Visit Diagnosis: Acute bilateral low back pain with left-sided sciatica  Acute pain of left knee  Stiffness of left knee, not elsewhere classified  Difficulty in walking, not elsewhere classified     Problem List Patient Active Problem List   Diagnosis Date Noted  . Palpitations 08/19/2015    JPercival Spanish PT, MPT 05/10/2016, 12:06 PM  [facility] [place] [address]Phone: [phone]  Fax:  [phone] Name: Brittany SASSONEMRN: [id number] of Birth: 2October 16, 1952

## 2016-05-12 ENCOUNTER — Ambulatory Visit: Payer: BLUE CROSS/BLUE SHIELD | Admitting: Physical Therapy

## 2016-05-12 DIAGNOSIS — M25562 Pain in left knee: Secondary | ICD-10-CM

## 2016-05-12 DIAGNOSIS — M5442 Lumbago with sciatica, left side: Secondary | ICD-10-CM

## 2016-05-12 DIAGNOSIS — R262 Difficulty in walking, not elsewhere classified: Secondary | ICD-10-CM

## 2016-05-12 DIAGNOSIS — M25662 Stiffness of left knee, not elsewhere classified: Secondary | ICD-10-CM
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
## 2016-05-17 ENCOUNTER — Ambulatory Visit: Payer: BLUE CROSS/BLUE SHIELD | Admitting: Physical Therapy

## 2016-05-17 DIAGNOSIS — M25562 Pain in left knee: Secondary | ICD-10-CM

## 2016-05-17 DIAGNOSIS — M5442 Lumbago with sciatica, left side: Secondary | ICD-10-CM

## 2016-05-17 DIAGNOSIS — M25662 Stiffness of left knee, not elsewhere classified: Secondary | ICD-10-CM

## 2016-05-17 DIAGNOSIS — R262 Difficulty in walking, not elsewhere classified: Secondary | ICD-10-CM

## 2016-05-17 NOTE — Therapy (Signed)
[facility] [address]  [place], [place], [address] Phone: [phone]   Fax:  [phone]  Physical Therapy Treatment  Patient Details  Name: Brittany Middleton MRN: [id number] Date of Birth: [birth year] Referring Provider: Baldo Ash, PA-C  Encounter Date: 05/17/2016      PT End of Session - 05/17/16 0847    Visit Number 21   Number of Visits 28   Date for PT Re-Evaluation 06/11/16   PT Start Time 0847   PT Stop Time 0928   PT Time Calculation (min) 41 min   Activity Tolerance Patient tolerated treatment well   Behavior During Therapy [facility] for tasks assessed/performed      Past Medical History:  Diagnosis Date  . Hypercholesteremia   . Hypertension     Past Surgical History:  Procedure Laterality Date  . BLADDER REPAIR    . BREAST LUMPECTOMY Left   . CARPAL TUNNEL RELEASE Right   . CATARACT EXTRACTION, BILATERAL  2014  . REPAIR RECTOCELE      There were no vitals filed for this visit.      Subjective Assessment - 05/17/16 0847    Subjective Pt reports she has been sick since her last therapy session so she has not done any exercises.   Limitations Sitting;Standing   Diagnostic tests 04/24/16 - Lumbar MRI: Multilevel lumbar disc and facet degeneration, most notable at L4-5 where there is moderate spinal, left lateral recess, and left neural foraminal stenosis.   Patient Stated Goals "To help alleviate the discomfort"   Currently in Pain? No/denies   Pain Onset More than a month ago   Pain Onset More than a month ago                   [facility] Adult PT Treatment/Exercise - 05/17/16 0847      Lumbar Exercises: Stretches   Passive Hamstring Stretch 20 seconds;1 rep   Passive Hamstring Stretch Limitations manual   ITB Stretch 30 seconds;1 rep   ITB Stretch Limitations manual   Piriformis Stretch 30 seconds;2 reps   Piriformis Stretch Limitations manual figure 4 & KTOS     Lumbar  Exercises: Standing   Row Both;15 reps;Theraband   Theraband Level (Row) Level 4 (Blue)   Row Limitations seated on green (65 cm) Pball   Shoulder Extension Both;15 reps;Limitations   Theraband Level (Shoulder Extension) Level 4 (Blue)   Shoulder Extension Limitations seated on green (65 cm) Pball     Lumbar Exercises: Seated   Long Arc Quad on [employer] Both;15 reps   LAQ on [place] Limitations 2# ankle cuff wts, seated on green (65 cm) Pball   Hip Flexion on Ball Both;15 reps  + opp shoulder flexion   Hip Flexion on Ball Limitations 2# ankle cuff wts, seated on green (65 cm) Pball   Other Seated Lumbar Exercises TrA abd isometric + chest press with blue TB seated on green (65 cm) Pball x10   Other Seated Lumbar Exercises Oblique trunk rotation with blue TB single UE row, seated on green (65 cm) Pball x15     Lumbar Exercises: Supine   Clam 10 reps;3 seconds   Clam Limitations Hooklying Alt Hip ABD/ER with blue TB   Bridge 10 reps;5 seconds   Bridge Limitations blue TB B Hip ABD isometric   Other Supine Lumbar Exercises Modified dead bug 20x3" (hooklying w/ feet resting on mat rather than in bent knee position)  Knee/Hip Exercises: Stretches   Other Knee/Hip Stretches Supine manual hip adductor stretch x30"     Knee/Hip Exercises: Aerobic   Nustep lvl 6 x 5'                  PT Short Term Goals - 03/11/16 1707      PT SHORT TERM GOAL #1   Title Complete LE assessment as pain levels allow & establish goals/POC as appropriate by 03/05/16   Period Weeks   Status Achieved     PT SHORT TERM GOAL #2   Title Independent with initial HEP by 03/05/16   Status Achieved           PT Long Term Goals - 05/12/16 1005      PT LONG TERM GOAL #1   Title Independent with advanced HEP/gym program as indicated by 06/11/16   Status Partially Met  Met for current HEP     PT LONG TERM GOAL #2   Title Lumbar ROM WFL w/o pain or radicular symptoms by 06/11/16   Status Partially  Met  ROM has decreased since fall due to increased pain & stiffness/tightness, but no radicular pain beyond buttock     PT LONG TERM GOAL #3   Title Report overall reduction of low back/radicular LE and L knee pain >/= 75% for improved sleeping, sitting and standing tolerance by 06/11/16   Status Partially Met  low back/radicular pain 40% improved, L knee pain 75% improved - LBP has worsened since recent fall earlier this week     PT LONG TERM GOAL #4   Title Ambulate with normal posture and gait pattern w/o limitation due to LBP or L knee pain >/= 3/10 by 06/11/16   Status Partially Met  demostrates improved posture but limp decreasing (had resolved prior to recent fall)     PT LONG TERM GOAL #5   Title Pt will report ability to sleep for a minimum of 4 hrs w/o limitation d/t LBP or L knee pain by 06/11/16   Status On-going     PT LONG TERM GOAL #6   Title Pt will report ability to sit for >/= 20 minutes w/o limitation d/t LBP by 06/11/16   Status On-going               Plan - 05/17/16 0851    Clinical Impression Statement LE flexibility significantly improved with pt reporting no pain during stretches and noting performing stretches at home often helps mitigate pain. Tolerating progression of reps and resistance with many exercises today with no c/o of increased pain or fatigue.   Rehab Potential Good   PT Frequency 2x / week   PT Duration 6 weeks   PT Treatment/Interventions Patient/family education;Therapeutic exercise;Neuromuscular re-education;Manual techniques;Dry needling;Taping;Electrical Stimulation;Moist Heat;Ultrasound;Cryotherapy;Vasopneumatic Device;Iontophoresis 47m/ml Dexamethasone;Gait training;ADLs/Self Care Home Management   PT Next Visit Plan Manual therapy including TDN as indicated; Continue LE flexibility as indicated; Lumbar stabilization & LE strengthening with standing/upright progression as tolerated; Modaliities PRN for pain inlcuding ionto patch #5 of  6 as continued benefit noted; Progress HEP as tolerated   Consulted and Agree with Plan of Care Patient      Patient will benefit from skilled therapeutic intervention in order to improve the following deficits and impairments:  Pain, Impaired flexibility, Decreased range of motion, Decreased strength, Increased muscle spasms, Decreased activity tolerance, Difficulty walking, Abnormal gait  Visit Diagnosis: Acute bilateral low back pain with left-sided sciatica  Acute pain of left knee  Stiffness  of left knee, not elsewhere classified  Difficulty in walking, not elsewhere classified     Problem List Patient Active Problem List   Diagnosis Date Noted  . Palpitations 08/19/2015    Percival Spanish, PT, MPT 05/17/2016, 9:31 AM  [facility] [address]  [place], [place], [address] Phone: [phone]   Fax:  [phone]  Name: MAQUITA SANDOVAL MRN: [id number] Date of Birth: 1950/08/24

## 2016-05-19 ENCOUNTER — Ambulatory Visit: Payer: BLUE CROSS/BLUE SHIELD | Admitting: Physical Therapy

## 2016-05-20 ENCOUNTER — Encounter: Payer: Self-pay | Admitting: Rehabilitative and Restorative Service Providers"

## 2016-05-20 ENCOUNTER — Ambulatory Visit: Payer: BLUE CROSS/BLUE SHIELD | Admitting: Rehabilitative and Restorative Service Providers"

## 2016-05-20 DIAGNOSIS — R262 Difficulty in walking, not elsewhere classified: Secondary | ICD-10-CM

## 2016-05-20 DIAGNOSIS — M5442 Lumbago with sciatica, left side: Secondary | ICD-10-CM | POA: Diagnosis not present

## 2016-05-20 DIAGNOSIS — M25562 Pain in left knee: Secondary | ICD-10-CM

## 2016-05-20 DIAGNOSIS — M25662 Stiffness of left knee, not elsewhere classified: Secondary | ICD-10-CM
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
## 2016-05-24 ENCOUNTER — Ambulatory Visit: Payer: BLUE CROSS/BLUE SHIELD | Attending: Physician Assistant | Admitting: Physical Therapy

## 2016-05-24 DIAGNOSIS — M25562 Pain in left knee: Secondary | ICD-10-CM | POA: Insufficient documentation

## 2016-05-24 DIAGNOSIS — M25662 Stiffness of left knee, not elsewhere classified: Secondary | ICD-10-CM | POA: Insufficient documentation

## 2016-05-24 DIAGNOSIS — M5442 Lumbago with sciatica, left side: Secondary | ICD-10-CM

## 2016-05-24 DIAGNOSIS — R262 Difficulty in walking, not elsewhere classified: Secondary | ICD-10-CM | POA: Insufficient documentation

## 2016-05-24 NOTE — Therapy (Signed)
[facility] [address]  [place], [place], [address] Phone: [phone]   Fax:  [phone]  Physical Therapy Treatment  Patient Details  Name: Brittany Middleton MRN: [id number] Date of Birth: 08-Jan-1951 Referring Provider: Baldo Ash, PA-C  Encounter Date: 05/24/2016      PT End of Session - 05/24/16 0852    Visit Number 23   Number of Visits 28   Date for PT Re-Evaluation 06/11/16   PT Start Time 0852  Pt arrived late   PT Stop Time 0930   PT Time Calculation (min) 38 min   Activity Tolerance Patient tolerated treatment well   Behavior During Therapy [facility] for tasks assessed/performed      Past Medical History:  Diagnosis Date  . Hypercholesteremia   . Hypertension     Past Surgical History:  Procedure Laterality Date  . BLADDER REPAIR    . BREAST LUMPECTOMY Left   . CARPAL TUNNEL RELEASE Right   . CATARACT EXTRACTION, BILATERAL  2014  . REPAIR RECTOCELE      There were no vitals filed for this visit.      Subjective Assessment - 05/24/16 0857    Subjective Pt noting benefit from latest ionto patch, but did report itching sensation (no redness) at location of dispersive electrode for a few days afterwards. Reports a very busy weekend with grandchildren with no limitations due to low back or LE pain, but did note some fatigue.   Patient Stated Goals "To help alleviate the discomfort"   Currently in Pain? No/denies   Pain Onset More than a month ago                         [facility] Adult PT Treatment/Exercise - 05/24/16 0852      Lumbar Exercises: Stretches   Quadruped Mid Back Stretch 30 seconds   Quadruped Mid Back Stretch Limitations seated 3 way prayer stretch with green (65 cm) Pball     Lumbar Exercises: Supine   Clam 10 reps;3 seconds   Clam Limitations Hooklying Alt Hip ABD/ER with black TB   Bridge 10 reps;5 seconds   Bridge Limitations black TB B Hip ABD isometric    Large Ball Oblique Isometric 10 reps;5 seconds   Large Ball Oblique Isometric Limitations LTR with lower legs on green (65 cm) Pball   Other Supine Lumbar Exercises Modified dead bug 20x3" (hooklying w/ feet resting on mat rather than in bent knee position)     Knee/Hip Exercises: Aerobic   Nustep lvl 6 x 5'                  PT Short Term Goals - 03/11/16 1707      PT SHORT TERM GOAL #1   Title Complete LE assessment as pain levels allow & establish goals/POC as appropriate by 03/05/16   Period Weeks   Status Achieved     PT SHORT TERM GOAL #2   Title Independent with initial HEP by 03/05/16   Status Achieved           PT Long Term Goals - 05/12/16 1005      PT LONG TERM GOAL #1   Title Independent with advanced HEP/gym program as indicated by 06/11/16   Status Partially Met  Met for current HEP     PT LONG TERM GOAL #2   Title Lumbar ROM WFL w/o pain or radicular symptoms by 06/11/16  Status Partially Met  ROM has decreased since fall due to increased pain & stiffness/tightness, but no radicular pain beyond buttock     PT LONG TERM GOAL #3   Title Report overall reduction of low back/radicular LE and L knee pain >/= 75% for improved sleeping, sitting and standing tolerance by 06/11/16   Status Partially Met  low back/radicular pain 40% improved, L knee pain 75% improved - LBP has worsened since recent fall earlier this week     PT LONG TERM GOAL #4   Title Ambulate with normal posture and gait pattern w/o limitation due to LBP or L knee pain >/= 3/10 by 06/11/16   Status Partially Met  demostrates improved posture but limp decreasing (had resolved prior to recent fall)     PT LONG TERM GOAL #5   Title Pt will report ability to sleep for a minimum of 4 hrs w/o limitation d/t LBP or L knee pain by 06/11/16   Status On-going     PT LONG TERM GOAL #6   Title Pt will report ability to sit for >/= 20 minutes w/o limitation d/t LBP by 06/11/16   Status  On-going               Plan - 05/24/16 0906    Clinical Impression Statement Pt feels like she has done very well with PT with improving activity tolerance nearing where she had been prior to pain onset which brought her to PT. Discussed readiness to transition to HEP with pt in agreement, therefore focus of today's session was review of HEP with modifications/corrections as necessary. Will plan for final assessment and discharge at next visit.   Rehab Potential Good   PT Frequency 2x / week   PT Duration 6 weeks   PT Treatment/Interventions Patient/family education;Therapeutic exercise;Neuromuscular re-education;Manual techniques;Dry needling;Taping;Electrical Stimulation;Moist Heat;Ultrasound;Cryotherapy;Vasopneumatic Device;Iontophoresis 72m/ml Dexamethasone;Gait training;ADLs/Self Care Home Management   PT Next Visit Plan Goal assessment and discharge   Consulted and Agree with Plan of Care Patient      Patient will benefit from skilled therapeutic intervention in order to improve the following deficits and impairments:  Pain, Impaired flexibility, Decreased range of motion, Decreased strength, Increased muscle spasms, Decreased activity tolerance, Difficulty walking, Abnormal gait  Visit Diagnosis: Acute bilateral low back pain with left-sided sciatica  Acute pain of left knee  Stiffness of left knee, not elsewhere classified  Difficulty in walking, not elsewhere classified     Problem List Patient Active Problem List   Diagnosis Date Noted  . Palpitations 08/19/2015    JPercival Spanish PT, MPT 05/24/2016, 9:31 AM  [facility] [place] [address]Phone: [phone]  Fax:  [phone] Name: Brittany LENGACHERMRN: [id number] of Birth: [birth year]

## 2016-05-26 ENCOUNTER — Ambulatory Visit: Payer: BLUE CROSS/BLUE SHIELD | Admitting: Physical Therapy

## 2016-05-27 ENCOUNTER — Ambulatory Visit: Payer: BLUE CROSS/BLUE SHIELD | Admitting: Physical Therapy

## 2016-05-27 DIAGNOSIS — M25562 Pain in left knee: Secondary | ICD-10-CM

## 2016-05-27 DIAGNOSIS — M25662 Stiffness of left knee, not elsewhere classified: Secondary | ICD-10-CM

## 2016-05-27 DIAGNOSIS — M5442 Lumbago with sciatica, left side: Secondary | ICD-10-CM

## 2016-05-27 DIAGNOSIS — R262 Difficulty in walking, not elsewhere classified: Secondary | ICD-10-CM

## 2016-05-27 NOTE — Therapy (Signed)
[facility] [address]  [place], [place], [address] Phone: [phone]   Fax:  [phone]  Physical Therapy Treatment  Patient Details  Name: Brittany Middleton MRN: [id number] Date of Birth: 21-Apr-1951 Referring Provider: Baldo Ash, PA-C  Encounter Date: 05/27/2016      PT End of Session - 05/27/16 0805    Visit Number 24   Number of Visits 28   Date for PT Re-Evaluation 06/11/16   PT Start Time 0805  Pt arrived late   PT Stop Time 0841   PT Time Calculation (min) 36 min   Activity Tolerance Patient tolerated treatment well   Behavior During Therapy [facility] for tasks assessed/performed      Past Medical History:  Diagnosis Date  . Hypercholesteremia   . Hypertension     Past Surgical History:  Procedure Laterality Date  . BLADDER REPAIR    . BREAST LUMPECTOMY Left   . CARPAL TUNNEL RELEASE Right   . CATARACT EXTRACTION, BILATERAL  2014  . REPAIR RECTOCELE      There were no vitals filed for this visit.      Subjective Assessment - 05/27/16 0810    Subjective Pt reports she has been sleeping longer but still notes pain will sometimes disturb her after ~3-4 hrs causing her to have get up and move around to allieviate the pain.   How long can you sit comfortably? 2 hours   How long can you stand comfortably? 45 minutes   How long can you walk comfortably? 30 minutes / ~1 mile   Patient Stated Goals "To help alleviate the discomfort"   Currently in Pain? No/denies   Pain Onset More than a month ago            [facility] PT Assessment - 05/27/16 0805      Assessment   Medical Diagnosis LBP with L LE sciatica & L knee pain   Referring Provider Baldo Ash, PA-C     Observation/Other Assessments   Focus on Therapeutic Outcomes (FOTO)  Lumbar Spine - 72% (28% limitation)     AROM   Overall AROM Comments Lumbar ROM w/o pain buy mild tightness noted with B sidebending   Left Knee Extension 3    Left Knee Flexion 122   Lumbar Flexion WFL   Lumbar Extension 50%   Lumbar - Right Side Bend [facility]   Lumbar - Left Side Bend WFL   Lumbar - Right Rotation WFL   Lumbar - Left Rotation [facility]     Strength   Right Hip Flexion 4+/5   Right Hip Extension 4+/5   Right Hip ABduction 4+/5   Right Hip ADduction 4+/5   Left Hip Flexion 4+/5   Left Hip Extension 4+/5   Left Hip ABduction 4+/5   Left Hip ADduction 4+/5   Right Knee Flexion 5/5   Right Knee Extension 5/5   Left Knee Flexion 5/5   Left Knee Extension 4+/5                  PT Education - 05/27/16 0841    Education provided Yes   Education Details Review of expected HEP frequency and need for continued regular activity to prevent return of symptoms   Person(s) Educated Patient   Methods Explanation   Comprehension Verbalized understanding          PT Short Term Goals - 03/11/16 1707      PT SHORT TERM GOAL #  1   Title Complete LE assessment as pain levels allow & establish goals/POC as appropriate by 03/05/16   Period Weeks   Status Achieved     PT SHORT TERM GOAL #2   Title Independent with initial HEP by 03/05/16   Status Achieved           PT Long Term Goals - 05/27/16 0816      PT LONG TERM GOAL #1   Title Independent with advanced HEP/gym program as indicated by 06/11/16   Status Achieved     PT LONG TERM GOAL #2   Title Lumbar ROM WFL w/o pain or radicular symptoms by 06/11/16   Status Achieved     PT LONG TERM GOAL #3   Title Report overall reduction of low back/radicular LE and L knee pain >/= 75% for improved sleeping, sitting and standing tolerance by 06/11/16   Status Achieved  Pt reporting 95% overall improvement     PT LONG TERM GOAL #4   Title Ambulate with normal posture and gait pattern w/o limitation due to LBP or L knee pain >/= 3/10 by 06/11/16   Status Achieved     PT LONG TERM GOAL #5   Title Pt will report ability to sleep for a minimum of 4 hrs w/o limitation d/t LBP  or L knee pain by 06/11/16   Status Partially Met  Pt reporting able to sleep 3-4 hrs w/o limitation d/t pain     PT LONG TERM GOAL #6   Title Pt will report ability to sit for >/= 20 minutes w/o limitation d/t LBP by 06/11/16   Status Achieved               Plan - 05/27/16 0816    Clinical Impression Statement Pt noting great benefit from PT, reporting 95% overall improvement with low back and knee pain. Pt is typically w/o pain except still waking sometimes after 3-4 hrs sleep with pain causing her to get up and move around after which pain resolves and she is able to return to sleep. L knee and lumbar ROM and strength now [facility] w/o pain. Pt has been able to resume most of her prior activities and anticipates no issues with getting back into her normal routine. All goals met except sleep goal only partially, although this has been continuing to improve. Pt feels confident with transitioning to HEP, therefore will proceed with discharge from PT for this episode.   Rehab Potential Good   PT Frequency --   PT Duration --   PT Treatment/Interventions Patient/family education;Therapeutic exercise;Neuromuscular re-education;Manual techniques;Dry needling;Taping;Electrical Stimulation;Moist Heat;Ultrasound;Cryotherapy;Vasopneumatic Device;Iontophoresis 27m/ml Dexamethasone;Gait training;ADLs/Self Care Home Management   PT Next Visit Plan Discharge   Consulted and Agree with Plan of Care Patient      Patient will benefit from skilled therapeutic intervention in order to improve the following deficits and impairments:  Pain, Impaired flexibility, Decreased range of motion, Decreased strength, Increased muscle spasms, Decreased activity tolerance, Difficulty walking, Abnormal gait  Visit Diagnosis: Acute bilateral low back pain with left-sided sciatica  Acute pain of left knee  Stiffness of left knee, not elsewhere classified  Difficulty in walking, not elsewhere  classified     Problem List Patient Active Problem List   Diagnosis Date Noted  . Palpitations 08/19/2015    JPercival Spanish PT, MPT 05/27/2016, 8:53 AM  [facility] [address]Phone: [phone]  Fax:  [phone]  Name: Brittany Middleton MRN: [id number] Date of Birth: [birth year]   PHYSICAL THERAPY DISCHARGE SUMMARY  Visits from Start of Care: 24  Current functional level related to goals / functional outcomes:    Refer to above clinical impression   Remaining deficits:    As above   Education / Equipment:    HEP  Plan: Patient agrees to discharge.  Patient goals were partially met. Patient is being discharged due to being pleased with the current functional level.  ?????     Percival Spanish, PT, MPT 05/27/16, 8:53 AM  [facility] [address]  [place], [place], [address] Phone: [phone]   Fax:  [phone]

## 2016-05-31 ENCOUNTER — Ambulatory Visit: Payer: BLUE CROSS/BLUE SHIELD | Admitting: Physical Therapy

## 2016-06-02 ENCOUNTER — Ambulatory Visit: Payer: BLUE CROSS/BLUE SHIELD | Admitting: Physical Therapy

## 2016-06-07 ENCOUNTER — Ambulatory Visit: Payer: BLUE CROSS/BLUE SHIELD | Admitting: Physical Therapy

## 2016-06-09 ENCOUNTER — Ambulatory Visit: Payer: BLUE CROSS/BLUE SHIELD | Admitting: Physical Therapy

## 2016-11-20 IMAGING — MR MR LUMBAR SPINE W/O CM
4 of 5 series · 21 of 48 positions shown · non-contrast
Comparison: CT abdomen and pelvis 10/31/2014

CLINICAL DATA: Acute low back pain radiating to the left leg. Some
bowel changes. Symptoms since 02/11/2016.

EXAM:
MRI LUMBAR SPINE WITHOUT CONTRAST
TECHNIQUE: Multiplanar, multisequence MR imaging of the lumbar spine was
performed. No intravenous contrast was administered.

[Series 6: T2 · sagittal · 4.0mm · 0.73mm/px · 6 of 15 slices shown (1 of 2)]
[im 1/15]
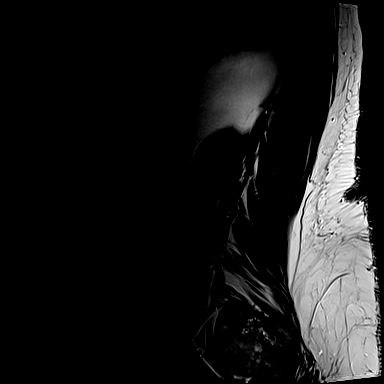
[im 3/15]
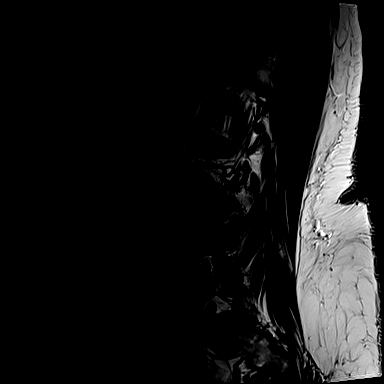
[im 6/15]
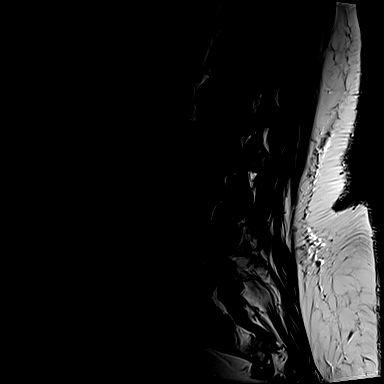
[im 9/15]
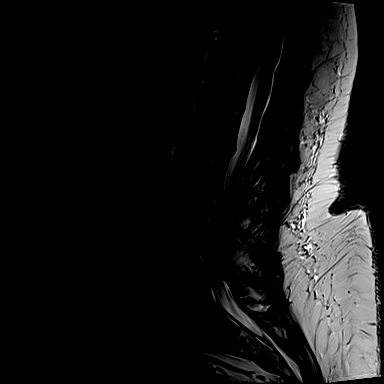
[im 12/15]
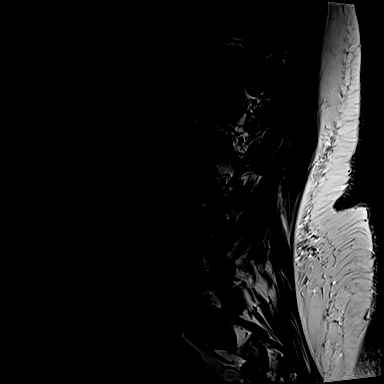
[im 15/15]
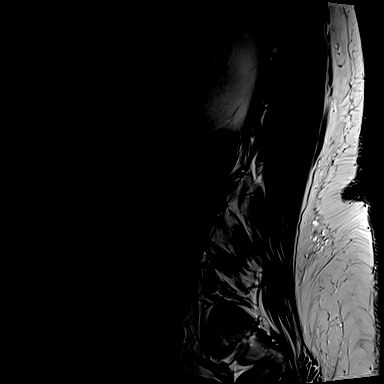

[Series 8: T1 · sagittal · 4.0mm · 0.73mm/px · 4 of 15 slices shown (1 of 2)]
[im 1/15]
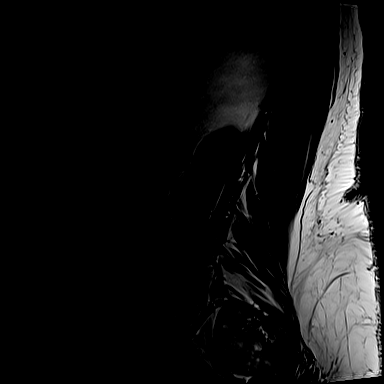
[im 3/15]
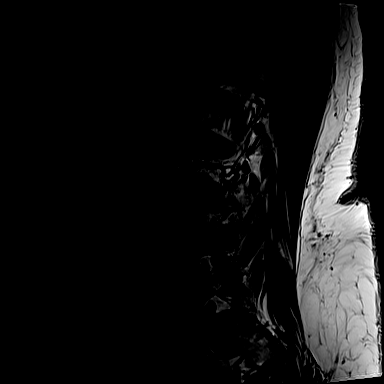
[im 8/15]
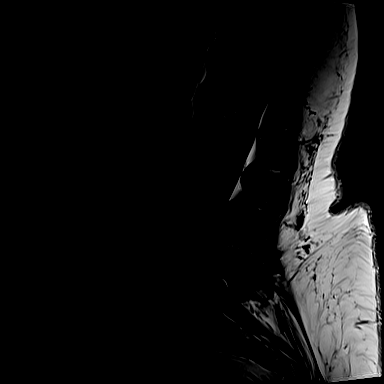
[im 12/15]
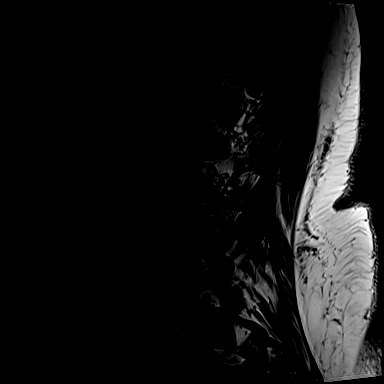

[Series 9: T1 · axial · 4.0mm · 0.56mm/px · z∈[+18,+152]mm · 3 of 31 slices shown (2 of 2)]
[im 5/31]
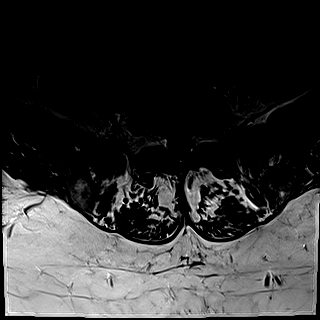
[im 17/31]
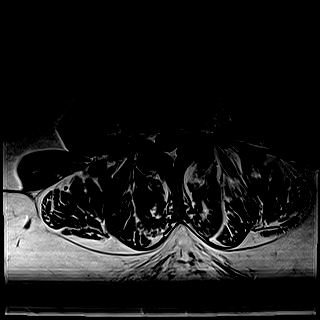
[im 26/31]
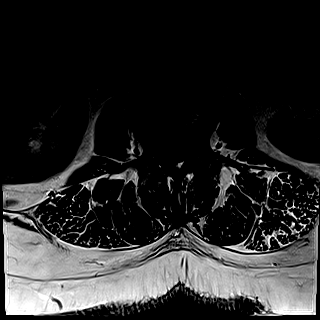

[Series 10: T2 · axial · 4.0mm · 0.28mm/px · z∈[-2,+177]mm · 8 of 31 slices shown (2 of 2)]
[im 1/31]
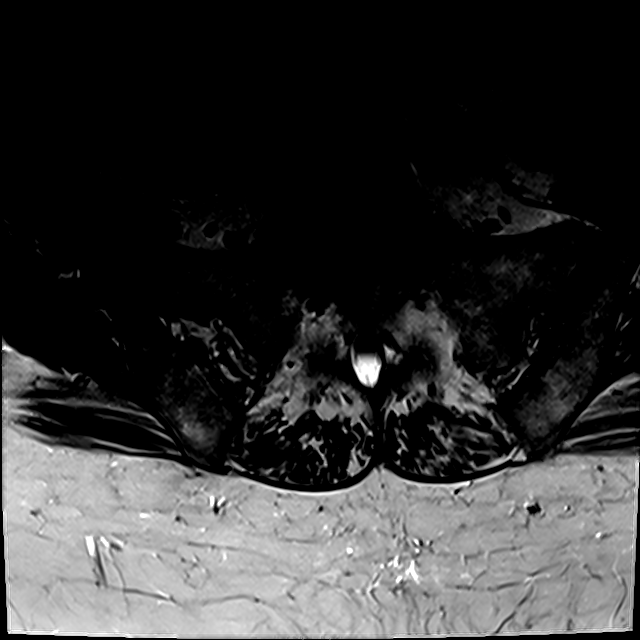
[im 5/31]
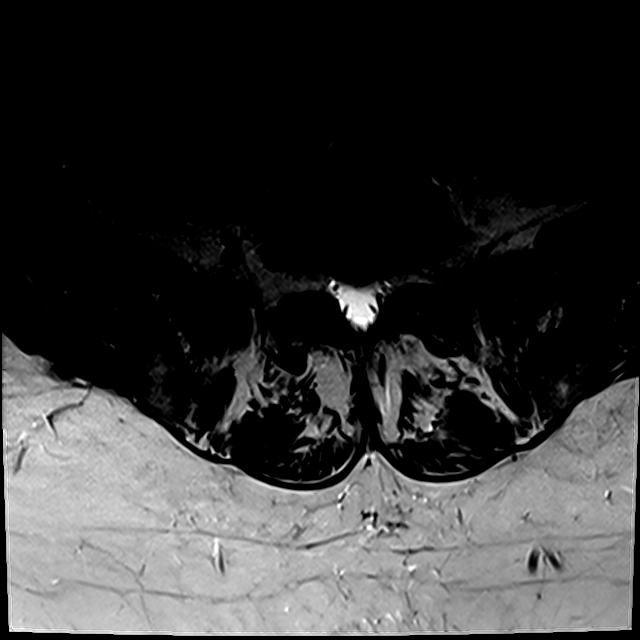
[im 10/31]
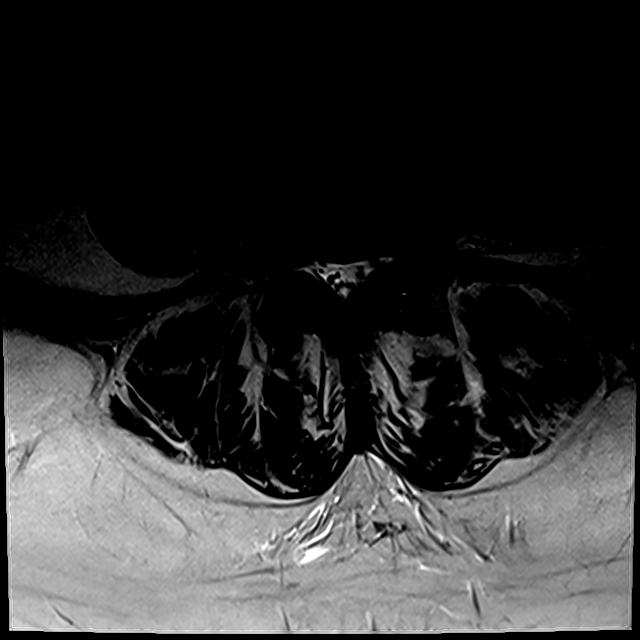
[im 14/31]
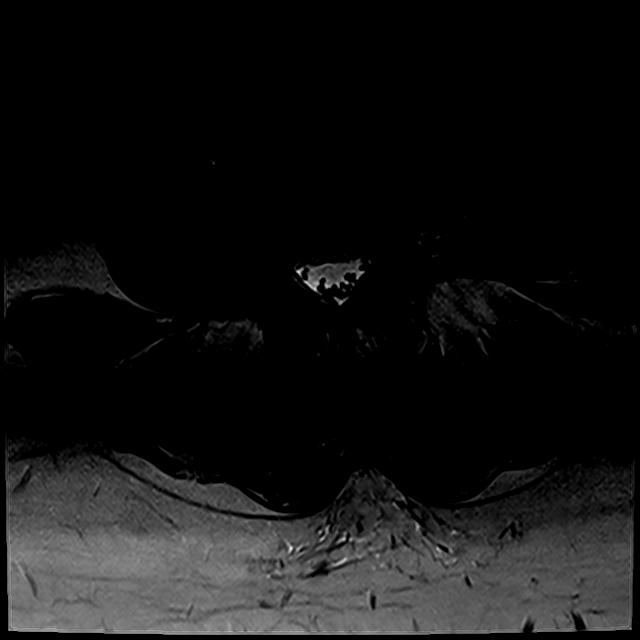
[im 17/31]
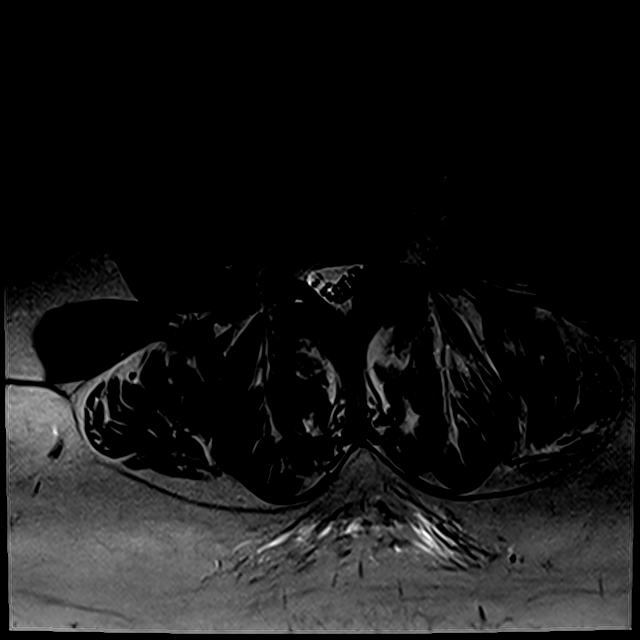
[im 21/31]
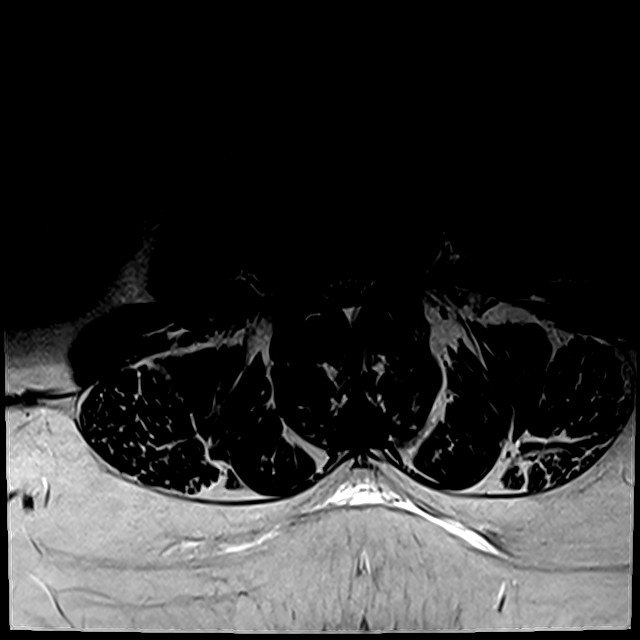
[im 26/31]
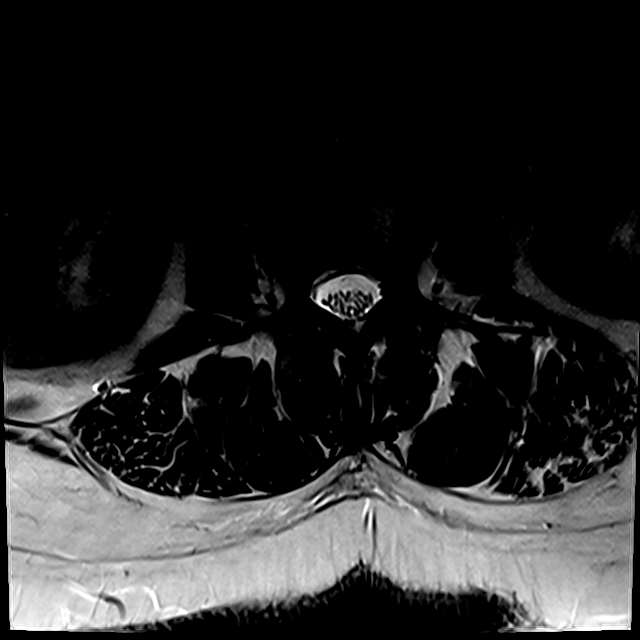
[im 31/31]
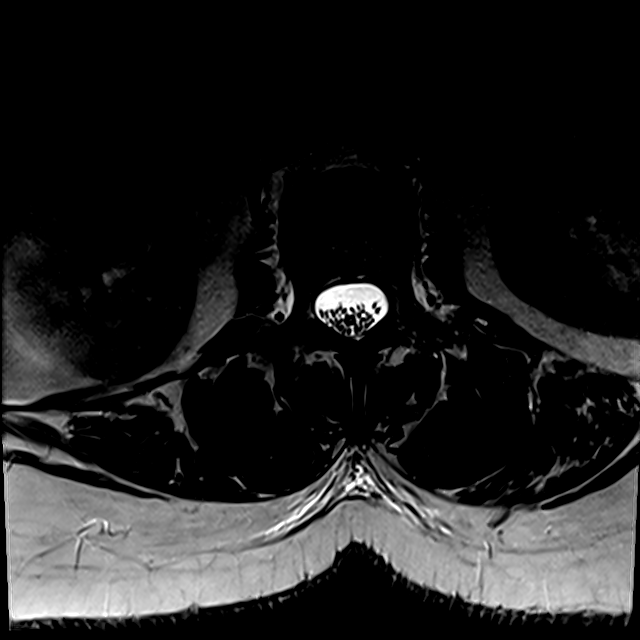

[21 of 48 positions shown; findings below may reference images not displayed]

FINDINGS: Segmentation:  Standard.

Alignment: Mild lumbar dextroscoliosis with apex at L4. Trace
retrolisthesis of L1 on L2, L2 on L3, and L3 on L4 and 3 mm
anterolisthesis of L4 on L5, unchanged.

Vertebrae: Preserved vertebral body heights without evidence of
fracture. L2 vertebral body hemangioma. Mild type 1 and type 2
degenerative endplate changes at L1-2 and L2-3.

Conus medullaris: Extends to the L1 level and appears normal.

Paraspinal and other soft tissues: Unremarkable.

Disc levels:

L1-2: Disc desiccation and mild-to-moderate disc space narrowing.
Circumferential disc bulging greater to the right and mild facet and
ligamentum flavum hypertrophy result in minimal right lateral recess
stenosis without significant spinal stenosis or neural foraminal
stenosis. Small right facet joint effusion.

L2-3: Disc desiccation and moderate disc space narrowing.
Circumferential disc bulging and moderate facet and ligamentum
flavum hypertrophy result in mild to moderate spinal stenosis, mild
bilateral lateral recess stenosis, and mild bilateral neural
foraminal stenosis.

L3-4: Mild disc space narrowing. Circumferential disc bulging and
moderate facet and ligamentum flavum hypertrophy result in mild left
lateral recess and mild left neural foraminal stenosis without
significant spinal stenosis.

L4-5: Disc desiccation and mild disc space narrowing. Listhesis with
bulging uncovered disc, prominent ligamentum flavum thickening, and
advanced facet arthrosis result in moderate spinal stenosis,
mild-to-moderate left lateral recess stenosis, and moderate left
neural foraminal stenosis. The left L5 nerve root may be affected in
the lateral recess.

L5-S1:  Moderate facet arthrosis without stenosis.
IMPRESSION: Multilevel lumbar disc and facet degeneration, most notable at L4-5
where there is moderate spinal, left lateral recess, and left neural
foraminal stenosis.

## 2017-04-07 DIAGNOSIS — I10 Essential (primary) hypertension: Secondary | ICD-10-CM | POA: Insufficient documentation

## 2017-04-07 DIAGNOSIS — E7800 Pure hypercholesterolemia, unspecified: Secondary | ICD-10-CM | POA: Insufficient documentation

## 2017-04-07 HISTORY — DX: Pure hypercholesterolemia, unspecified: E78.00

## 2017-04-07 HISTORY — DX: Essential (primary) hypertension: I10

## 2017-12-13 ENCOUNTER — Other Ambulatory Visit: Payer: Self-pay | Admitting: Family Medicine

## 2017-12-13 DIAGNOSIS — R221 Localized swelling, mass and lump, neck: Secondary | ICD-10-CM

## 2017-12-21 ENCOUNTER — Ambulatory Visit
Admission: RE | Admit: 2017-12-21 | Discharge: 2017-12-21 | Disposition: A | Discharge status: Home / Self Care | Payer: BLUE CROSS/BLUE SHIELD | Source: Ambulatory Visit | Attending: Family Medicine | Admitting: Family Medicine

## 2017-12-21 DIAGNOSIS — R221 Localized swelling, mass and lump, neck: Secondary | ICD-10-CM

## 2018-02-27 ENCOUNTER — Observation Stay (HOSPITAL_BASED_OUTPATIENT_CLINIC_OR_DEPARTMENT_OTHER)
Admission: EM | Admit: 2018-02-27 | Discharge: 2018-02-28 | Disposition: A | Discharge status: Home / Self Care | Payer: Medicare Other | Attending: Internal Medicine | Admitting: Internal Medicine

## 2018-02-27 ENCOUNTER — Other Ambulatory Visit: Payer: Self-pay

## 2018-02-27 ENCOUNTER — Encounter (HOSPITAL_BASED_OUTPATIENT_CLINIC_OR_DEPARTMENT_OTHER): Payer: Self-pay | Admitting: *Deleted

## 2018-02-27 ENCOUNTER — Emergency Department (HOSPITAL_BASED_OUTPATIENT_CLINIC_OR_DEPARTMENT_OTHER): Payer: Medicare Other

## 2018-02-27 DIAGNOSIS — Z7982 Long term (current) use of aspirin: Secondary | ICD-10-CM | POA: Insufficient documentation

## 2018-02-27 DIAGNOSIS — Z79899 Other long term (current) drug therapy: Secondary | ICD-10-CM | POA: Insufficient documentation

## 2018-02-27 DIAGNOSIS — Z8249 Family history of ischemic heart disease and other diseases of the circulatory system: Secondary | ICD-10-CM | POA: Insufficient documentation

## 2018-02-27 DIAGNOSIS — R0789 Other chest pain: Principal | ICD-10-CM | POA: Insufficient documentation

## 2018-02-27 DIAGNOSIS — E7800 Pure hypercholesterolemia, unspecified: Secondary | ICD-10-CM | POA: Insufficient documentation

## 2018-02-27 DIAGNOSIS — R079 Chest pain, unspecified: Secondary | ICD-10-CM

## 2018-02-27 DIAGNOSIS — I10 Essential (primary) hypertension: Secondary | ICD-10-CM

## 2018-02-27 DIAGNOSIS — E876 Hypokalemia: Secondary | ICD-10-CM | POA: Diagnosis not present

## 2018-02-27 DIAGNOSIS — Z6841 Body Mass Index (BMI) 40.0 and over, adult: Secondary | ICD-10-CM | POA: Diagnosis not present

## 2018-02-27 DIAGNOSIS — E669 Obesity, unspecified: Secondary | ICD-10-CM | POA: Insufficient documentation

## 2018-02-27 DIAGNOSIS — R200 Anesthesia of skin: Secondary | ICD-10-CM | POA: Insufficient documentation

## 2018-02-27 DIAGNOSIS — I2510 Atherosclerotic heart disease of native coronary artery without angina pectoris: Secondary | ICD-10-CM | POA: Diagnosis not present

## 2018-02-27 DIAGNOSIS — Z791 Long term (current) use of non-steroidal anti-inflammatories (NSAID): Secondary | ICD-10-CM | POA: Insufficient documentation

## 2018-02-27 DIAGNOSIS — R1011 Right upper quadrant pain: Secondary | ICD-10-CM | POA: Diagnosis not present

## 2018-02-27 DIAGNOSIS — R1010 Upper abdominal pain, unspecified: Secondary | ICD-10-CM

## 2018-02-27 DIAGNOSIS — E785 Hyperlipidemia, unspecified: Secondary | ICD-10-CM | POA: Diagnosis present

## 2018-02-27 DIAGNOSIS — R0602 Shortness of breath: Secondary | ICD-10-CM

## 2018-02-27 DIAGNOSIS — Z8679 Personal history of other diseases of the circulatory system: Secondary | ICD-10-CM

## 2018-02-27 HISTORY — DX: Essential (primary) hypertension: I10

## 2018-02-27 HISTORY — DX: Unspecified thoracic, thoracolumbar and lumbosacral intervertebral disc disorder: M51.9

## 2018-02-27 HISTORY — DX: Personal history of other specified conditions: Z87.898

## 2018-02-27 HISTORY — DX: Fatty (change of) liver, not elsewhere classified: K76.0

## 2018-02-27 HISTORY — DX: Chest pain, unspecified: R07.9

## 2018-02-27 HISTORY — DX: Cervical disc disorder, unspecified, unspecified cervical region: M50.90

## 2018-02-27 HISTORY — DX: Hyperlipidemia, unspecified: E78.5

## 2018-02-27 LAB — CBC
HCT: 42.4 % (ref 36.0–46.0)
Hemoglobin: 13.9 g/dL (ref 12.0–15.0)
MCH: 30.4 pg (ref 26.0–34.0)
MCHC: 32.8 g/dL (ref 30.0–36.0)
MCV: 92.8 fL (ref 78.0–100.0)
Platelets: 230 10*3/uL (ref 150–400)
RBC: 4.57 MIL/uL (ref 3.87–5.11)
RDW: 13.6 % (ref 11.5–15.5)
WBC: 4.9 10*3/uL (ref 4.0–10.5)

## 2018-02-27 LAB — URINALYSIS, ROUTINE W REFLEX MICROSCOPIC
Bilirubin Urine: NEGATIVE
Glucose, UA: NEGATIVE mg/dL
Ketones, ur: NEGATIVE mg/dL
Leukocytes, UA: NEGATIVE
Nitrite: NEGATIVE
Protein, ur: NEGATIVE mg/dL
Specific Gravity, Urine: 1.025 (ref 1.005–1.030)
pH: 6 (ref 5.0–8.0)

## 2018-02-27 LAB — COMPREHENSIVE METABOLIC PANEL
ALT: 23 U/L (ref 0–44)
AST: 26 U/L (ref 15–41)
Albumin: 4 g/dL (ref 3.5–5.0)
Alkaline Phosphatase: 64 U/L (ref 38–126)
Anion gap: 11 (ref 5–15)
BUN: 15 mg/dL (ref 8–23)
CO2: 28 mmol/L (ref 22–32)
Calcium: 8.9 mg/dL (ref 8.9–10.3)
Chloride: 100 mmol/L (ref 98–111)
Creatinine, Ser: 0.71 mg/dL (ref 0.44–1.00)
GFR calc Af Amer: >60 mL/min (ref >60)
GFR calc non Af Amer: >60 mL/min (ref >60)
Glucose, Bld: 132 mg/dL — ABNORMAL HIGH (ref 70–99)
Potassium: 3.3 mmol/L — ABNORMAL LOW (ref 3.5–5.1)
Sodium: 139 mmol/L (ref 135–145)
Total Bilirubin: 0.6 mg/dL (ref 0.3–1.2)
Total Protein: 7.2 g/dL (ref 6.5–8.1)

## 2018-02-27 LAB — TROPONIN I
Troponin I: <0.03 ng/mL (ref <0.03)
Troponin I: <0.03 ng/mL (ref <0.03)

## 2018-02-27 LAB — URINALYSIS, MICROSCOPIC (REFLEX): WBC, UA: NONE SEEN WBC/hpf (ref 0–5)

## 2018-02-27 LAB — D-DIMER, QUANTITATIVE: D-Dimer, Quant: 0.31 ug/mL-FEU (ref 0.00–0.50)

## 2018-02-27 LAB — LIPASE, BLOOD: Lipase: 37 U/L (ref 11–51)

## 2018-02-27 MED ORDER — ACETAMINOPHEN 650 MG RE SUPP: 650.0000 mg | Freq: Four times a day (QID) | RECTAL | Status: DC | PRN

## 2018-02-27 MED ORDER — ACETAMINOPHEN 325 MG PO TABS: 650.0000 mg | ORAL_TABLET | Freq: Four times a day (QID) | ORAL | Status: DC | PRN

## 2018-02-27 MED ORDER — ENOXAPARIN SODIUM 40 MG/0.4ML ~~LOC~~ SOLN
40.0000 mg | SUBCUTANEOUS | Status: DC
  Administered 2018-02-27: 40 mg via SUBCUTANEOUS
  Filled 2018-02-27: qty 0.4

## 2018-02-27 MED ORDER — LISINOPRIL 20 MG PO TABS
20.0000 mg | ORAL_TABLET | Freq: Every day | ORAL | Status: DC
  Administered 2018-02-28: 20 mg via ORAL
  Filled 2018-02-27: qty 1

## 2018-02-27 MED ORDER — ONDANSETRON HCL 4 MG PO TABS: 4.0000 mg | ORAL_TABLET | Freq: Four times a day (QID) | ORAL | Status: DC | PRN

## 2018-02-27 MED ORDER — HYDROCHLOROTHIAZIDE 25 MG PO TABS
25.0000 mg | ORAL_TABLET | Freq: Every day | ORAL | Status: DC
  Administered 2018-02-28: 25 mg via ORAL
  Filled 2018-02-27: qty 1

## 2018-02-27 MED ORDER — PRAVASTATIN SODIUM 20 MG PO TABS: 20.0000 mg | ORAL_TABLET | Freq: Every day | ORAL | Status: DC

## 2018-02-27 MED ORDER — POTASSIUM CHLORIDE CRYS ER 20 MEQ PO TBCR
40.0000 meq | EXTENDED_RELEASE_TABLET | Freq: Once | ORAL | Status: AC
  Administered 2018-02-27: 40 meq via ORAL
  Filled 2018-02-27: qty 2

## 2018-02-27 MED ORDER — ONDANSETRON HCL 4 MG/2ML IJ SOLN: 4.0000 mg | Freq: Four times a day (QID) | INTRAMUSCULAR | Status: DC | PRN

## 2018-02-27 MED ORDER — LISINOPRIL-HYDROCHLOROTHIAZIDE 20-25 MG PO TABS: 1.0000 | ORAL_TABLET | Freq: Every day | ORAL | Status: DC

## 2018-02-27 MED ORDER — POTASSIUM CHLORIDE CRYS ER 20 MEQ PO TBCR
20.0000 meq | EXTENDED_RELEASE_TABLET | Freq: Once | ORAL | Status: AC
  Administered 2018-02-27: 20 meq via ORAL
  Filled 2018-02-27: qty 1

## 2018-02-27 MED ORDER — ASPIRIN 81 MG PO CHEW
324.0000 mg | CHEWABLE_TABLET | Freq: Once | ORAL | Status: AC
  Administered 2018-02-27: 324 mg via ORAL
  Filled 2018-02-27: qty 4

## 2018-02-27 NOTE — H&P (Signed)
History and Physical    [name]:[id number] DOB: June 28, 1950 DOA: 02/27/2018  PCP: London Pepper, MD   Patient coming from: Home  Chief Complaint: Chest pain  HPI: Brittany Middleton is a 67 y.o. female with medical history significant for HTN, dyslipidemia, who presented to the [facility] ED, chest pressure with associated shortness of breath of 3 days duration.  Chest pain described as pressure , left-sided, intermittent lasting 5 to 45 minutes.  Chest pain is unrelated to activity.  Chest pain sometimes occurs at rest. She reports some easy fatigability while walking her dog.  No associated nausea vomiting dizziness or palpitations.  Patient reports some left shoulder pain, numbness and tingling in her thumb, for an middle finger, over the past several months but worse in the past week (she has had carpal tunnel surgery for her right hand 1990).  She has no associated chest pain with pain in her left upper extremity.  No prior cardiac history, not on daily aspirin.  Denies family history of premature coronary artery disease.  Never smoker.  Recent travel to [place] patient came back 03/11/18, she reported pain at the back of her left knee only, this has since resolved.  No swelling of her lower extremities, no redness.  She also reported some crampy mild right upper quadrant abdominal pain over the past few days.  ED Course:  HR 50s to 60s.  Otherwise stable vitals.  Trop x1, K- 3.3.  Negative d-dimer. 2 view chest x-ray negative for acute abnormality, right upper quadrant ultrasound steatosis.  UA large hemoglobin, RBCs, rare bacteria.  Hospitalist was called to admit/transfer from [facility] for chest pain rule out ACS.  Review of Systems: As per HPI all other systems reviewed and negative  Past Medical History:  Diagnosis Date  . Hypercholesteremia   . Hypertension     Past Surgical History:  Procedure Laterality Date  . BLADDER REPAIR    . BREAST LUMPECTOMY Left   .  CARPAL TUNNEL RELEASE Right   . CATARACT EXTRACTION, BILATERAL  2014  . REPAIR RECTOCELE       reports that she has never smoked. She has never used smokeless tobacco. She reports that she does not drink alcohol or use drugs.  Allergies  Allergen Reactions  . Codeine Nausea And Vomiting   Mother has CHF, brother has carotid artery disease.  Prior to Admission medications   Medication Sig Start Date End Date Taking? Authorizing Provider  lisinopril-hydrochlorothiazide (PRINZIDE,ZESTORETIC) 20-25 MG per tablet Take 1 tablet by mouth daily.   Yes [provider]  lovastatin (MEVACOR) 20 MG tablet Take 20 mg by mouth at bedtime.   Yes [provider]  Multiple Vitamins-Minerals (MULTIVITAMIN WITH MINERALS) tablet Take 1 tablet by mouth daily.   Yes [provider]  Omega-3 Fatty Acids (FISH OIL) 1000 MG CAPS Take 1,000 mg by mouth daily.   Yes [provider]  aspirin EC 81 MG tablet Take 81 mg by mouth daily.    [provider]  Biotin 5000 MCG TABS Take 5,000 mcg by mouth daily.    [provider]  cholecalciferol (VITAMIN D) 1000 UNITS tablet Take 1,000 Units by mouth daily.    [provider]  Coenzyme Q10 (CO Q 10 PO) Take 1 tablet by mouth daily.    [provider]  ferrous sulfate 325 (65 FE) MG tablet Take 325 mg by mouth daily with breakfast.    [provider]  Flaxseed,  Linseed, (FLAX SEED OIL) 1000 MG CAPS Take 1,000 mg by mouth daily.    [provider]  HYDROcodone-acetaminophen (NORCO/VICODIN) 5-325 MG per tablet Take 2 tablets by mouth every 4 (four) hours as needed for moderate pain. Patient not taking: Reported on 02/19/2016 10/31/14   Robynn Pane, MD  meloxicam (MOBIC) 15 MG tablet Take 15 mg by mouth daily.    [provider]  methocarbamol (ROBAXIN) 500 MG tablet Take 500 mg by mouth every 8 (eight) hours as needed for muscle spasms.    [provider]    nitrofurantoin, macrocrystal-monohydrate, (MACROBID) 100 MG capsule Take 100 mg by mouth 2 (two) times daily.    [provider]  ondansetron (ZOFRAN) 4 MG tablet Take 1 tablet (4 mg total) by mouth every 6 (six) hours. Patient not taking: Reported on 02/19/2016 10/31/14   Robynn Pane, MD  vitamin E 400 UNIT capsule Take 400 Units by mouth daily.    [provider]    Physical Exam: Vitals:   02/27/18 1630 02/27/18 1838 02/27/18 1900 02/27/18 2031  BP: (!) 131/108 (!) 109/51 121/67 (!) 145/75  Pulse: (!) 58 (!) 52 (!) 54 (!) 59  Resp: 16 16 17 17   Temp:    97.9 F (36.6 C)  TempSrc:    Oral  SpO2: 95% 97% 98% 97%  Weight:      Height:        Constitutional: NAD, calm, comfortable Vitals:   02/27/18 1630 02/27/18 1838 02/27/18 1900 02/27/18 2031  BP: (!) 131/108 (!) 109/51 121/67 (!) 145/75  Pulse: (!) 58 (!) 52 (!) 54 (!) 59  Resp: 16 16 17 17   Temp:    97.9 F (36.6 C)  TempSrc:    Oral  SpO2: 95% 97% 98% 97%  Weight:      Height:       Eyes: PERRL, lids and conjunctivae normal ENMT: Mucous membranes are moist. Posterior pharynx clear of any exudate or lesions.  Neck: normal, supple, no masses, no thyromegaly Respiratory: clear to auscultation bilaterally, no wheezing, no crackles. Normal respiratory effort. No accessory muscle use.  Cardiovascular: Regular rate and rhythm, no murmurs / rubs / gallops. No extremity edema. 2+ pedal pulses. No carotid bruits.  Abdomen: no tenderness, no masses palpated. No hepatosplenomegaly. Bowel sounds positive.  Musculoskeletal: no clubbing / cyanosis. No joint deformity upper and lower extremities. Good ROM, no contractures. Normal muscle tone.  Tinel sign negative left hand. Skin: no rashes, lesions, ulcers. No induration Neurologic: CN 2-12 grossly intact. Sensation intact, DTR normal. Strength 5/5 in all 4.  Psychiatric: Normal judgment and insight. Alert and oriented x 3. Normal mood.   Labs on Admission: I have  personally reviewed following labs and imaging studies  CBC: Recent Labs  Lab 02/27/18 1445  WBC 4.9  HGB 13.9  HCT 42.4  MCV 92.8  PLT 161   Basic Metabolic Panel: Recent Labs  Lab 02/27/18 1445  NA 139  K 3.3*  CL 100  CO2 28  GLUCOSE 132*  BUN 15  CREATININE 0.71  CALCIUM 8.9   Liver Function Tests: Recent Labs  Lab 02/27/18 1445  AST 26  ALT 23  ALKPHOS 64  BILITOT 0.6  PROT 7.2  ALBUMIN 4.0   Recent Labs  Lab 02/27/18 1445  LIPASE 37   Cardiac Enzymes: Recent Labs  Lab 02/27/18 1445  TROPONINI <0.03   Urine analysis:    Component Value Date/Time   COLORURINE YELLOW 02/27/2018 1541  APPEARANCEUR CLEAR 02/27/2018 1541   LABSPEC 1.025 02/27/2018 1541   PHURINE 6.0 02/27/2018 1541   GLUCOSEU NEGATIVE 02/27/2018 1541   HGBUR MODERATE (A) 02/27/2018 1541   BILIRUBINUR NEGATIVE 02/27/2018 1541   KETONESUR NEGATIVE 02/27/2018 1541   PROTEINUR NEGATIVE 02/27/2018 1541   UROBILINOGEN 1.0 10/31/2014 1646   NITRITE NEGATIVE 02/27/2018 1541   LEUKOCYTESUR NEGATIVE 02/27/2018 1541    Radiological Exams on Admission: Dg Chest 2 View  Result Date: 02/27/2018 CLINICAL DATA:  Intermittent chest pain and shortness of breath for 3 days. EXAM: CHEST - 2 VIEW COMPARISON:  None. FINDINGS: The lungs are clear. Heart size is normal. No pneumothorax or pleural effusion. No acute or focal bony abnormality. IMPRESSION: No acute disease. Electronically Signed   By: Inge Rise M.D.   On: 02/27/2018 15:28   US Abdomen Limited Ruq  Result Date: 02/27/2018 CLINICAL DATA:  Right upper quadrant pain EXAM: ULTRASOUND ABDOMEN LIMITED RIGHT UPPER QUADRANT COMPARISON:  CT 10/31/2014 FINDINGS: Gallbladder: No gallstones or wall thickening visualized. No sonographic Murphy sign noted by sonographer. Common bile duct: Diameter: 2.6 mm Liver: Increased hepatic echogenicity. Portal vein is patent on color Doppler imaging with normal direction of blood flow towards the liver.  IMPRESSION: 1. Negative for gallstones or biliary dilatation 2. Increased hepatic echogenicity consistent with steatosis and or hepatocellular disease. Electronically Signed   By: Donavan Foil M.D.   On: 02/27/2018 17:19    EKG: Independently reviewed.  Sinus rhythm.  Low voltage QRS.  No significant ST or T wave abnormalities.  Old EKG to compare. Qtc- 422.  Assessment/Plan Active Problems:   Chest pain   HTN (hypertension)   HLD (hyperlipidemia)  Chest pain-with associated SOB. EKG troponin unremarkable.  No prior cardiac stress test to work-up.  Moderate risk factors- HTN, HLD, obesity.  Never smoker, no family history of premature CAD. Negative d-dimer. Mild liver enzymes, RUQ Us-steatosis, negative for acute abnormality.  -Trend troponins -EKG a.m. -Echocardiogram -Consult cardiology for possible stress test -N.p.o. Midnight -Lipid panel  Mild hypokalemia- 3.3.  Likely secondary to HCTZ. -Replete -Check magnesium, BMP a.m.  Left hand numbness involving thumb, middle, fore finger only. No weakness. -Likely carpal tunnel syndrome, sed rate history of carpal tunnel syndrome right hand req uiring surgery. -Consider splint, low palpation.  HTN- Stable bp - Cont lisinopril HCTZ 20-25mg  daily.   DVT prophylaxis: lovenox Code Status: Full Family Communication: None at bedside Disposition Plan: 1- 2 days Consults called: Please consult Cartdiology in a.m for possible stress testing. Admission status: Obs, tele   Bethena Roys MD [facility] Pager [phone] From 6PM-2AM.  Otherwise please contact night-coverage [email or web address] Password [facility]  02/27/2018, 9:39 PM

## 2018-02-27 NOTE — ED Notes (Signed)
ED Provider at bedside. 

## 2018-02-27 NOTE — ED Provider Notes (Signed)
[facility] EMERGENCY DEPARTMENT Provider Note   CSN: [id number] Arrival date & time: 02/27/18  1424     History   Chief Complaint Chief Complaint  Patient presents with  . Chest Pain    HPI Brittany Middleton is a 67 y.o. female with past medical history of hypertension and hypercholesterolemia who presents emergency department today for chest pain and shortness of breath.  The patient reports that over the last 3 days she has been having pain on the left side of her chest that radiates into her left shoulder and down her left arm.  She reports associated tingling sensation in the left arm as well as shortness of breath when the pain occurs.  She reports that the pain occurs both at rest and also with exertion.  She states the pain typically lasts for approximately 5-45 minutes before being relieved with rest.  She states when she has the pain/pressure/heaviness that she rates it as a 3/10.  She has tried Tylenol for her symptoms but she is unsure if it has helped.  She states that her symptoms are not worsened with exertion however she does feel more general fatigued with exerting herself, such as when walking her dogs and playing with her grandchildren. Patient denies any aspirin use.  No fever, chills, back pain, nausea, vomiting.  Patient denies any current chest pain.  She has had a Holter monitor in the past for palpitations.  No prior echocardiogram, stress test or heart catheterization.  Patient denies tobacco abuse.  She reports family history of CAD including her mother.   The patient reports she has been dealing with "respiratory problems" since August 21 when she flew back from [place].  She thought this was a cold and she had a clear sputum production with her cough but notes that the symptoms have not resolved.  She notes that her pain is not worsened with deep breathing but she is "aware" of it. She denies hemoptysis. She is not on blood thinners. No lower extremity  swelling. No prior history of PE/DVT. She denies any current chest pain or shortness of breath.   The patient also reports that she has had some pain in her right upper quadrant of her abdomen over the last 1 month the typically occurs after eating.  She states this is a cramping pain in nature and denies any radiation.  She reports that the pain typically lasts for approximately 1-2 hours before relieving.  She denies any associated nausea, vomiting, diarrhea, constipation, fever with this.  She denies any chronic NSAID use or alcohol abuse.  She notes no previous abdominal surgeries.  She has had a normal bowel movement this morning.  Denies any pain currently.  Denies any urinary symptoms.   HPI  Past Medical History:  Diagnosis Date  . Hypercholesteremia   . Hypertension     Patient Active Problem List   Diagnosis Date Noted  . Palpitations 08/19/2015    Past Surgical History:  Procedure Laterality Date  . BLADDER REPAIR    . BREAST LUMPECTOMY Left   . CARPAL TUNNEL RELEASE Right   . CATARACT EXTRACTION, BILATERAL  2014  . REPAIR RECTOCELE       OB History   None      Home Medications    Prior to Admission medications   Medication Sig Start Date End Date Taking? Authorizing Provider  lisinopril-hydrochlorothiazide (PRINZIDE,ZESTORETIC) 20-25 MG per tablet Take 1 tablet by mouth daily.   Yes [provider]  lovastatin (MEVACOR) 20 MG tablet Take 20 mg by mouth at bedtime.   Yes [provider]  Multiple Vitamins-Minerals (MULTIVITAMIN WITH MINERALS) tablet Take 1 tablet by mouth daily.   Yes [provider]  Omega-3 Fatty Acids (FISH OIL) 1000 MG CAPS Take 1,000 mg by mouth daily.   Yes [provider]  aspirin EC 81 MG tablet Take 81 mg by mouth daily.    [provider]  Biotin 5000 MCG TABS Take 5,000 mcg by mouth daily.    [provider]  cholecalciferol (VITAMIN D) 1000 UNITS tablet Take 1,000 Units by mouth  daily.    [provider]  Coenzyme Q10 (CO Q 10 PO) Take 1 tablet by mouth daily.    [provider]  ferrous sulfate 325 (65 FE) MG tablet Take 325 mg by mouth daily with breakfast.    [provider]  Flaxseed, Linseed, (FLAX SEED OIL) 1000 MG CAPS Take 1,000 mg by mouth daily.    [provider]  HYDROcodone-acetaminophen (NORCO/VICODIN) 5-325 MG per tablet Take 2 tablets by mouth every 4 (four) hours as needed for moderate pain. Patient not taking: Reported on 02/19/2016 10/31/14   Robynn Pane, MD  meloxicam (MOBIC) 15 MG tablet Take 15 mg by mouth daily.    [provider]  methocarbamol (ROBAXIN) 500 MG tablet Take 500 mg by mouth every 8 (eight) hours as needed for muscle spasms.    [provider]  nitrofurantoin, macrocrystal-monohydrate, (MACROBID) 100 MG capsule Take 100 mg by mouth 2 (two) times daily.    [provider]  ondansetron (ZOFRAN) 4 MG tablet Take 1 tablet (4 mg total) by mouth every 6 (six) hours. Patient not taking: Reported on 02/19/2016 10/31/14   Robynn Pane, MD  vitamin E 400 UNIT capsule Take 400 Units by mouth daily.    [provider]    Family History No family history on file.  Social History Social History   Tobacco Use  . Smoking status: Never Smoker  . Smokeless tobacco: Never Used  Substance Use Topics  . Alcohol use: No  . Drug use: No     Allergies   Codeine   Review of Systems Review of Systems  All other systems reviewed and are negative.    Physical Exam Updated Vital Signs BP (!) 127/57   Pulse 63   Temp 98.3 F (36.8 C) (Oral)   Resp 20   Ht 5\' 5"  (1.651 m)   Wt 119.7 kg   SpO2 96%   BMI 43.93 kg/m   Physical Exam  Constitutional: She appears well-developed and well-nourished.  Obese female in no acute distress  HENT:  Head: Normocephalic and atraumatic.  Right Ear: External ear normal.  Left Ear: External ear normal.  Nose: Nose normal.    Mouth/Throat: Uvula is midline, oropharynx is clear and moist and mucous membranes are normal. No tonsillar exudate.  Eyes: Pupils are equal, round, and reactive to light. Right eye exhibits no discharge. Left eye exhibits no discharge. No scleral icterus.  Neck: Trachea normal. Neck supple. No spinous process tenderness present. No neck rigidity. Normal range of motion present.  Cardiovascular: Normal rate, regular rhythm and intact distal pulses.  No murmur heard. Pulses:      Radial pulses are 2+ on the right side, and 2+ on the left side.       Dorsalis pedis pulses are 2+ on the right side, and 2+ on the left side.  Posterior tibial pulses are 2+ on the right side, and 2+ on the left side.  No lower extremity swelling or edema. Calves symmetric in size bilaterally.  Pulmonary/Chest: Effort normal and breath sounds normal. She exhibits no tenderness.  Abdominal: Soft. Bowel sounds are normal. She exhibits no distension. There is no tenderness. There is no rigidity, no rebound, no guarding and no CVA tenderness.  Musculoskeletal: She exhibits no edema.  Lymphadenopathy:    She has no cervical adenopathy.  Neurological: She is alert.  Skin: Skin is warm and dry. No rash noted. She is not diaphoretic.  Psychiatric: She has a normal mood and affect.  Nursing note and vitals reviewed.    ED Treatments / Results  Labs (all labs ordered are listed, but only abnormal results are displayed) Labs Reviewed  COMPREHENSIVE METABOLIC PANEL - Abnormal; Notable for the following components:      Result Value   Potassium 3.3 (*)    Glucose, Bld 132 (*)    All other components within normal limits  URINALYSIS, ROUTINE W REFLEX MICROSCOPIC - Abnormal; Notable for the following components:   Hgb urine dipstick MODERATE (*)    All other components within normal limits  URINALYSIS, MICROSCOPIC (REFLEX) - Abnormal; Notable for the following components:   Bacteria, UA RARE (*)    All other  components within normal limits  CBC  TROPONIN I  LIPASE, BLOOD  D-DIMER, QUANTITATIVE (NOT AT [facility])    EKG EKG Interpretation  Date/Time:  Monday February 27 2018 14:33:05 EDT Ventricular Rate:  61 PR Interval:  154 QRS Duration: 92 QT Interval:  420 QTC Calculation: 422 R Axis:   61 Text Interpretation:  Normal sinus rhythm Low voltage QRS Borderline ECG No significant change since last tracing Confirmed by Isla Pence [phone]) on 02/27/2018 2:34:22 PM   Radiology Dg Chest 2 View  Result Date: 02/27/2018 CLINICAL DATA:  Intermittent chest pain and shortness of breath for 3 days. EXAM: CHEST - 2 VIEW COMPARISON:  None. FINDINGS: The lungs are clear. Heart size is normal. No pneumothorax or pleural effusion. No acute or focal bony abnormality. IMPRESSION: No acute disease. Electronically Signed   By: Inge Rise M.D.   On: 02/27/2018 15:28   US Abdomen Limited Ruq  Result Date: 02/27/2018 CLINICAL DATA:  Right upper quadrant pain EXAM: ULTRASOUND ABDOMEN LIMITED RIGHT UPPER QUADRANT COMPARISON:  CT 10/31/2014 FINDINGS: Gallbladder: No gallstones or wall thickening visualized. No sonographic Murphy sign noted by sonographer. Common bile duct: Diameter: 2.6 mm Liver: Increased hepatic echogenicity. Portal vein is patent on color Doppler imaging with normal direction of blood flow towards the liver. IMPRESSION: 1. Negative for gallstones or biliary dilatation 2. Increased hepatic echogenicity consistent with steatosis and or hepatocellular disease. Electronically Signed   By: Donavan Foil M.D.   On: 02/27/2018 17:19    Procedures Procedures (including critical care time)  Medications Ordered in ED Medications  aspirin chewable tablet 324 mg (324 mg Oral Given 02/27/18 1629)  potassium chloride SA (K-DUR,KLOR-CON) CR tablet 20 mEq (20 mEq Oral Given 02/27/18 1629)     Initial Impression / Assessment and Plan / ED Course  I have reviewed the triage vital signs and the nursing  notes.  Pertinent labs & imaging results that were available during my care of the patient were reviewed by me and considered in my medical decision making (see chart for details).     67 year old female with a history of hypertension, hyperlipidemia, obesity and family history of CAD (  mother) who presents emergency department today for chest pain and shortness of breath.  Patient reports that she has had some shortness of breath as well as a nonproductive cough since returning from a plane trip from [place] on August 21.  She reports for the last 3 days she has been having left-sided chest with radiation to her left shoulder down her left arm with associated shortness of breath.  She reports that this occurs during rest and also with exertion.  Her symptoms the last 5-45 minutes before being relieved.  She reports her symptoms are not worsened with exertion however she states she feels generally fatigued with exertion including activities such as walking her dogs and playing with her grandchildren.  She is chest pain-free currently.  She also reports some right upper quadrant abdominal pain for the last 1 month typically occurs after eating.  She believes this may be her gallbladder.  She states this is a cramping pain and typically last proximal 1-2 hours.  There is no associated fever, nausea, vomiting, diarrhea.  Patient denies any pain currently.  She is had a normal bowel movement today.  No prior abdominal surgeries.  Abdomen is soft and without any peritoneal signs.  Bowel sounds in all 4 quadrants auscultated.  Vital signs are reassuring and without fever, tachycardia, tachypnea or hypoxia.  EKG is normal sinus rhythm.  No evidence of STEMI.  Chest pain and abdominal labs ordered.  No interventions at this time as patient has chest pain-free.  D-dimer ordered given patient's shortness of breath and fatigue with exertion since his plane trip in August.    Heart score is a 5.   D-dimer is  within normal limits.  Patient remains without tachycardia or hypoxia.  I do not feel she needs further evaluation for PE.  She denies any chest pain during her stay.  Troponin within normal limits. EKG reviewed. She will require admission for chest pain rule out. ASA ordered. Patient in agreement with plan.   Abdominal labs and right upper quadrant ultrasound reassuring.Marland Kitchen  UA without evidence of UTI.  No leukocytosis.  Mild hypokalemia that was replaced orally.  No acute kidney injury.  LFTs within normal limits.  Bilirubin within normal limits.  No evidence of DKA.  Lipase within normal limits.  Right upper quadrant ultrasound without evidence of gallstones or biliary dilation.  Given the patient is without any abdominal tenderness or peritoneal signs no feel she requires CT scan or further evaluation of abdominal pain that is been intermittent over the last 1 month.  Will admit for chest pain rule out.  Appreciate the hospitalist for excepting the patient to the hospitalist service.  She is to be admitted to [facility] under observation, telemetry.   Final Clinical Impressions(s) / ED Diagnoses   Final diagnoses:  Upper abdominal pain  Chest pain in adult  History of hypertension  Shortness of breath  Hypokalemia    ED Discharge Orders    None       Lorelle Gibbs 02/27/18 1803    Julianne Rice, MD 03/03/18 [phone]

## 2018-02-27 NOTE — ED Notes (Signed)
Pt. Stated she has had some pain in the L lower leg just below the knee that comes and goes.  No noted edema in the L leg and no pain with walking per Pt.

## 2018-02-27 NOTE — ED Triage Notes (Signed)
Chest pressure and SOB on and off x 3 days.

## 2018-02-27 NOTE — ED Notes (Signed)
Family at bedside. 

## 2018-02-28 ENCOUNTER — Encounter (HOSPITAL_COMMUNITY): Payer: Self-pay | Admitting: Physician Assistant

## 2018-02-28 ENCOUNTER — Observation Stay (HOSPITAL_BASED_OUTPATIENT_CLINIC_OR_DEPARTMENT_OTHER): Payer: Medicare Other

## 2018-02-28 ENCOUNTER — Observation Stay (HOSPITAL_COMMUNITY): Payer: Medicare Other

## 2018-02-28 DIAGNOSIS — E785 Hyperlipidemia, unspecified: Secondary | ICD-10-CM

## 2018-02-28 DIAGNOSIS — R079 Chest pain, unspecified: Secondary | ICD-10-CM

## 2018-02-28 DIAGNOSIS — I5030 Unspecified diastolic (congestive) heart failure: Secondary | ICD-10-CM

## 2018-02-28 LAB — BASIC METABOLIC PANEL
Anion gap: 7 (ref 5–15)
BUN: 10 mg/dL (ref 8–23)
CO2: 28 mmol/L (ref 22–32)
Calcium: 8.8 mg/dL — ABNORMAL LOW (ref 8.9–10.3)
Chloride: 107 mmol/L (ref 98–111)
Creatinine, Ser: 0.78 mg/dL (ref 0.44–1.00)
GFR calc Af Amer: >60 mL/min (ref >60)
GFR calc non Af Amer: >60 mL/min (ref >60)
Glucose, Bld: 106 mg/dL — ABNORMAL HIGH (ref 70–99)
Potassium: 4.2 mmol/L (ref 3.5–5.1)
Sodium: 142 mmol/L (ref 135–145)

## 2018-02-28 LAB — LIPID PANEL
Cholesterol: 187 mg/dL (ref 0–200)
HDL: 42 mg/dL (ref >40)
LDL Cholesterol: 104 mg/dL — ABNORMAL HIGH (ref 0–99)
Total CHOL/HDL Ratio: 4.5 RATIO
Triglycerides: 204 mg/dL — ABNORMAL HIGH (ref <150)
VLDL: 41 mg/dL — ABNORMAL HIGH (ref 0–40)

## 2018-02-28 LAB — ECHOCARDIOGRAM COMPLETE
Height: 65 in
Weight: 4224 oz

## 2018-02-28 LAB — TROPONIN I: Troponin I: <0.03 ng/mL (ref <0.03)

## 2018-02-28 LAB — MAGNESIUM: Magnesium: 2 mg/dL (ref 1.7–2.4)

## 2018-02-28 LAB — HIV ANTIBODY (ROUTINE TESTING W REFLEX): HIV Screen 4th Generation wRfx: NONREACTIVE

## 2018-02-28 MED ORDER — NITROGLYCERIN 0.4 MG SL SUBL
0.8000 mg | SUBLINGUAL_TABLET | Freq: Once | SUBLINGUAL | Status: AC
  Administered 2018-02-28: 0.8 mg via SUBLINGUAL

## 2018-02-28 MED ORDER — IOPAMIDOL (ISOVUE-370) INJECTION 76%
100.0000 mL | Freq: Once | INTRAVENOUS | Status: AC
  Administered 2018-02-28: 90 mL via INTRAVENOUS

## 2018-02-28 MED ORDER — ASPIRIN EC 81 MG PO TBEC: 81.0000 mg | DELAYED_RELEASE_TABLET | Freq: Every day | ORAL | 2 refills | Status: DC

## 2018-02-28 MED ORDER — PERFLUTREN LIPID MICROSPHERE
1.0000 mL | INTRAVENOUS | Status: AC | PRN
  Administered 2018-02-28: 2 mL via INTRAVENOUS
  Filled 2018-02-28: qty 10

## 2018-02-28 MED ORDER — CLOPIDOGREL BISULFATE 75 MG PO TABS
75.0000 mg | ORAL_TABLET | Freq: Every day | ORAL | Status: DC
  Administered 2018-02-28: 75 mg via ORAL
  Filled 2018-02-28: qty 1

## 2018-02-28 MED ORDER — NITROGLYCERIN 0.4 MG SL SUBL
SUBLINGUAL_TABLET | SUBLINGUAL | Status: AC
  Filled 2018-02-28: qty 2

## 2018-02-28 MED ORDER — NITROGLYCERIN 0.4 MG SL SUBL: 0.4000 mg | SUBLINGUAL_TABLET | SUBLINGUAL | 12 refills | Status: DC | PRN

## 2018-02-28 MED ORDER — ASPIRIN EC 81 MG PO TBEC
81.0000 mg | DELAYED_RELEASE_TABLET | Freq: Once | ORAL | Status: AC
  Administered 2018-02-28: 81 mg via ORAL
  Filled 2018-02-28: qty 1

## 2018-02-28 MED ORDER — CLOPIDOGREL BISULFATE 75 MG PO TABS: 75.0000 mg | ORAL_TABLET | Freq: Every day | ORAL | 11 refills | Status: DC

## 2018-02-28 NOTE — Progress Notes (Signed)
Pt to echo via w/c.

## 2018-02-28 NOTE — Progress Notes (Signed)
   Patient cardiac CT completed with results as below:  IMPRESSION: 1. Coronary calcium score of 32. This was 49 percentile for age and sex matched control. 2. Normal coronary origin with right dominance. 3. 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 diagonal artery. 4. 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 Dual antiplatelet therapy with aspirin and Plavix is indicated. A repeat CTA for evaluation of aneurysmal size is recommended in 6 months.  CT FFR will be submitted.  The above results were reviewed with the patient. It was explained at that time that there was no obstructive CAD, not ACS.    PLAN:  - DAPT with ASA 81mg  daily and Plavix 75mg   - Follow-up with [place] office per patient's request as she lives in [facility]

## 2018-02-28 NOTE — Consult Note (Signed)
Cardiology Consultation:   Patient ID: Brittany Middleton MRN: [id number]; DOB: 1950-11-25  Admit date: 02/27/2018 Date of Consult: 02/28/2018  Primary Care Provider: London Pepper, MD Primary Cardiologist: No primary care provider on file. New- Dr. Harrell Gave Primary Electrophysiologist:  None   Patient Profile:   Brittany Middleton is a 67 y.o. female with a hx of HTN, HLD, herniated / degenerative lumbar and cervical disc disease (MRI, 2016) and palpitations (2017) who is being seen today for the evaluation of chest pain at the request of Dr. Rodolph Bong.  History of Present Illness:   Ms. Brittany Middleton is a 67 yo female with past medical history as above and no known history of cardiac disease other than palpitations in 2017 for which she wore a holter monitor. The report from that monitor is copied and pasted below and significant for NSR, rare PVCs and PACs and no significant arrhythmias. Patient also reports a 2009 study that was completed some time ago (?[employer]) and has been attempting to get those records. She reports the results of those studies showed abnormally enlarged cors at that time.   02/27/18: She presented to [facility] ED with left sided, intermittent, non-pleuritic chest pressure, lasting 5-15 minutes per episode and associated SOB and fatigue x~7d. The pain, intermittent and rated 3/10, reportedly radiates into her left shoulder and down her left arm and occurs at rest and with exertion. She reports left arm numbness as well, which is located on the anterior aspect of her arm. She denies relief with Tylenol. She denies f/c, n/v/d, feeling as if her heart is racing, feeling of palpitations. Patient reports travel back from [place] 02/08/18, at which time she developed a cold. It was at that time that she noted SOB amd fatogie, as well as mild chest pain. At that time, she attributed the chest pain to MSK pain d/t coughing secondary to her cold. Sx reportedly persisted since that time, so  she decided to come to the ED. No tobacco use. Family history significant for cardiovascular disease. Specifically, she reports her brother had a double enterectomy at age 70, mother with CHF age 25, & Mis with aunts x2 / uncle / grandparents. Of note, she also has been experiencing crampy RUQ pain ~1-2h after eating x1 month with associated [place] results below.   In the ED,  Vitals significant for BP 127/57, HR 63, T 98.3, RR 20, SpO2 96% Labs significant for K 3.3, Mg 2.0, glucose 132, Cr 0.71, Ca8.9, WBC 4.9, Hgb 13.9, plt 230. Troponin negative x3; D-dimer negative  EKG NSR, 61 bpm, poor r wave progression, with no significant change since last tracing CXR negative for acute disease RUQ [place] negative for gallstones, biliary dilation; increased hepatic echogenicity consistent with steatosis and hepatocellular disease (also seen on imaging in 2016). Rx ordered: ASA 324mg , KCl 34mEq tab  No current chest pain. Pending results of echo. Of note, patient has a history of both cervical spine disc disease and hepatic steatosis that will be included in the differential as possible etiologies of CP.  Past Medical History:  Diagnosis Date  . History of palpitations    holter monitor reportedly worn by pt, 2017  . Hypercholesteremia   . Hypertension     Past Surgical History:  Procedure Laterality Date  . BLADDER REPAIR    . BREAST LUMPECTOMY Left   . CARPAL TUNNEL RELEASE Right   . CATARACT EXTRACTION, BILATERAL  2014  . [place]  Medications:  Prior to Admission medications   Medication Sig Start Date End Date Taking? Authorizing Provider  acetaminophen (TYLENOL) 500 MG tablet Take 1,000 mg by mouth every 6 (six) hours as needed for mild pain, moderate pain, fever or headache.   Yes [provider]  cholecalciferol (VITAMIN D) 1000 UNITS tablet Take 1,000 Units by mouth daily.   Yes [provider]  Coenzyme Q10 (CO Q 10 PO) Take 1 tablet by mouth daily.    Yes [provider]  lisinopril-hydrochlorothiazide (PRINZIDE,ZESTORETIC) 20-25 MG per tablet Take 1 tablet by mouth daily.   Yes [provider]  lovastatin (MEVACOR) 20 MG tablet Take 20 mg by mouth at bedtime.   Yes [provider]  Melatonin 5 MG CHEW Chew 5 mg by mouth at bedtime.   Yes [provider]  Multiple Vitamins-Minerals (MULTIVITAMIN WITH MINERALS) tablet Take 1 tablet by mouth daily.   Yes [provider]  Omega-3 Fatty Acids (FISH OIL) 1000 MG CAPS Take 1,000 mg by mouth daily.   Yes [provider]  HYDROcodone-acetaminophen (NORCO/VICODIN) 5-325 MG per tablet Take 2 tablets by mouth every 4 (four) hours as needed for moderate pain. Patient not taking: Reported on 02/19/2016 10/31/14   Robynn Pane, MD  meloxicam (MOBIC) 15 MG tablet Take 15 mg by mouth daily.    [provider]  ondansetron (ZOFRAN) 4 MG tablet Take 1 tablet (4 mg total) by mouth every 6 (six) hours. Patient not taking: Reported on 02/27/2018 10/31/14   Robynn Pane, MD    Inpatient Medications: Scheduled Meds: . enoxaparin (LOVENOX) injection  40 mg Subcutaneous Q24H  . lisinopril  20 mg Oral Daily   And  . hydrochlorothiazide  25 mg Oral Daily  . pravastatin  20 mg Oral q1800   Continuous Infusions:  PRN Meds: acetaminophen **OR** acetaminophen, ondansetron **OR** ondansetron (ZOFRAN) IV  Allergies:    Allergies  Allergen Reactions  . Codeine Nausea And Vomiting    Social History:   Social History   Socioeconomic History  . Marital status: Divorced    Spouse name: Not on file  . Number of children: 2  . Years of education: Not on file  . Highest education level: Master's degree (e.g., MA, MS, MEng, MEd, MSW, MBA)  Occupational History  . Occupation: Retired  Scientific laboratory technician  . Financial resource strain: Not hard at all  . Food insecurity:    Worry: Never true    Inability: Never true  . Transportation needs:    Medical: No     Non-medical: No  Tobacco Use  . Smoking status: Never Smoker  . Smokeless tobacco: Never Used  Substance and Sexual Activity  . Alcohol use: No  . Drug use: No  . Sexual activity: Not on file  Lifestyle  . Physical activity:    Days per week: 7 days    Minutes per session: 60 min  . Stress: Only a little  Relationships  . Social connections:    Talks on phone: More than three times a week    Gets together: More than three times a week    Attends religious service: Never    Active member of club or organization: Yes    Attends meetings of clubs or organizations: 1 to 4 times per year    Relationship status: Divorced  . Intimate partner violence:    Fear of current or ex partner: No    Emotionally abused: No    Physically abused: No  Forced sexual activity: No  Other Topics Concern  . Not on file  Social History Narrative  . Not on file    Family History:    Family History  Problem Relation Age of Onset  . CAD Mother      ROS:  Please see the history of present illness.   All other ROS reviewed and negative.     Physical Exam/Data:   Vitals:   02/27/18 1900 02/27/18 2031 02/27/18 2336 02/28/18 0542  BP: 121/67 (!) 145/75 122/63 (!) 125/58  Pulse: (!) 54 (!) 59 60 (!) 55  Resp: 17 17 17 17   Temp:  97.9 F (36.6 C) 98.1 F (36.7 C) 98.2 F (36.8 C)  TempSrc:  Oral Oral Oral  SpO2: 98% 97% 95% 91%  Weight:      Height:       No intake or output data in the 24 hours ending 02/28/18 0817 Filed Weights   02/27/18 1429  Weight: 119.7 kg   Body mass index is 43.93 kg/m.  General:  Well nourished, well developed, in no acute distress. Chest pain free at this time HEENT: normal Lymph: no adenopathy Neck: no JVD Endocrine:  No thryomegaly Vascular: No carotid bruits; FA pulses 2+ bilaterally without bruits  Cardiac:  normal S1, S2; RRR; no murmur  Lungs:  clear to auscultation bilaterally, no wheezing, rhonchi or rales  Abd: soft, nontender, no  hepatomegaly  Ext: no edema Musculoskeletal:  No deformities, BUE and BLE strength normal and equal Skin: warm and dry  Neuro:  CNs 2-12 intact, no focal abnormalities noted Psych:  Normal affect   EKG:  The EKG was personally reviewed and demonstrates:  61 bpm, poor r wave progression, with no significant change since last tracing   Relevant CV Studies: 2017  48h Holter Monitor  NSR  Rare PVCs and PACs  No significant arrhythmias   **Pending results of echo  Laboratory Data:  Chemistry Recent Labs  Lab 02/27/18 1445 02/28/18 0345  NA 139 142  K 3.3* 4.2  CL 100 107  CO2 28 28  GLUCOSE 132* 106*  BUN 15 10  CREATININE 0.71 0.78  CALCIUM 8.9 8.8*  GFRNONAA >60 >60  GFRAA >60 >60  ANIONGAP 11 7    Recent Labs  Lab 02/27/18 1445  PROT 7.2  ALBUMIN 4.0  AST 26  ALT 23  ALKPHOS 64  BILITOT 0.6   Hematology Recent Labs  Lab 02/27/18 1445  WBC 4.9  RBC 4.57  HGB 13.9  HCT 42.4  MCV 92.8  MCH 30.4  MCHC 32.8  RDW 13.6  PLT 230   Cardiac Enzymes Recent Labs  Lab 02/27/18 1445 02/27/18 2203 02/28/18 0345  TROPONINI <0.03 <0.03 <0.03   No results for input(s): TROPIPOC in the last 168 hours.  BNPNo results for input(s): BNP, PROBNP in the last 168 hours.  DDimer  Recent Labs  Lab 02/27/18 1445  DDIMER 0.31    Radiology/Studies:  Dg Chest 2 View  Result Date: 02/27/2018 CLINICAL DATA:  Intermittent chest pain and shortness of breath for 3 days. EXAM: CHEST - 2 VIEW COMPARISON:  None. FINDINGS: The lungs are clear. Heart size is normal. No pneumothorax or pleural effusion. No acute or focal bony abnormality. IMPRESSION: No acute disease. Electronically Signed   By: Inge Rise M.D.   On: 02/27/2018 15:28   US Abdomen Limited Ruq  Result Date: 02/27/2018 CLINICAL DATA:  Right upper quadrant pain EXAM: ULTRASOUND ABDOMEN LIMITED RIGHT UPPER QUADRANT  COMPARISON:  CT 10/31/2014 FINDINGS: Gallbladder: No gallstones or wall thickening  visualized. No sonographic Murphy sign noted by sonographer. Common bile duct: Diameter: 2.6 mm Liver: Increased hepatic echogenicity. Portal vein is patent on color Doppler imaging with normal direction of blood flow towards the liver. IMPRESSION: 1. Negative for gallstones or biliary dilatation 2. Increased hepatic echogenicity consistent with steatosis and or hepatocellular disease. Electronically Signed   By: Donavan Foil M.D.   On: 02/27/2018 17:19    Assessment and Plan:   Chest pain in the setting of no known cardiac history (?past imaging with abnormal cors as in HPI) - Currently CP free - H/o palpitations with holter monitor results as above - NSR, no arrhthymias. ?Previous scan indicating enlarged vessels? Significant family history as noted above. - Pending results of echo. - Troponin negative. EKG without STE and unchanged from preivous. - Ddimer negative.  - Renal function stable with 0.78 Cr - Heart Score 4 (age 20, risk factors 2) - Ddx includes GI as PMH significant for hepatic steatosis since 2016, MSK/ neuro as PMH shows cervical disc disease since 2016 - Low to moderate suspicion for ischemic heart disease but cannot rule out at this time. Pending results of echo. Family history of heart disease is extensive. Patient is an ideal candidate for cardiac CT given she is female with multiple PMH factors and concerning family history. She has not had stents in the past. - Scheduled for cardiac CT today at 12:30PM with Dr. Meda Coffee as reader. Renal function stable. HR 52-63 since admission, NSR. No contrast allergies. RN informed of 18 gauge above the antecubital. Further recommendations pending results of imaging / cardiac CT and if coronary calcium present on imaging.  HTN - BP systolic 938-182  / diastolic 99-371 - Continue home lisinopril HCTZ 20-25mg   - Continue to monitor during admission  HLD - Continue lovastatin 20mg  - 9/10 LDL 104 and not at goal. Patient reports  compliance with lovastatin 20mg . - Goal LDL <70 - Recommendations regarding medication for lipid management following results of cardiac CT as above.         For questions or updates, please contact [place] Please consult [email or web address] for contact info under     Signed, Arvil Chaco, PA-C  02/28/2018 8:17 AM

## 2018-02-28 NOTE — Progress Notes (Signed)
Pt return from off floor. Respiorations even and non labored

## 2018-02-28 NOTE — Progress Notes (Signed)
  Echocardiogram 2D Echocardiogram has been performed.  Brittany Middleton 02/28/2018, 9:15 AM

## 2018-02-28 NOTE — Progress Notes (Signed)
Pt eating supper tray and is attentive to discharge instructions. Pt states after eating that she feels a fullness that does not allow her to take a deep breath and a generalized discomfort but denies pain. Dr. Evangeline Gula contacted with this information and vs and states to add omeprazole 10mg  x2 po daily until seen by PMD for follow up. Same is done and pt state she understands instructions. writteninstructions and information about chest pain and follow up given. And highlighted for patient. Pt home stble with friends via wc.

## 2018-02-28 NOTE — Progress Notes (Signed)
Pt given crackers and po fluids after discuss with Dr. Evangeline Gula. Pt tolerating very well

## 2018-02-28 NOTE — Discharge Summary (Addendum)
Physician Discharge Summary  Brittany Middleton ESP:[id number] DOB: 1951-03-24 DOA: 02/27/2018  PCP: London Pepper, MD  Admit date: 02/27/2018 Discharge date: 02/28/2018  Admitted From: Home Disposition: Home  Recommendations for Outpatient Follow-up:  Follow up with PCP in 3 to 4 days Keep appointment with Dr. Al Pimple on Tuesday, September 24 at [place]: No Equipment/Devices: None  Discharge Condition: Stable CODE STATUS: Full code Diet recommendation: Heart Healthy low-salt  Brief/Interim Summary: Brittany Middleton is a 67 y.o. female with medical history significant for HTN, dyslipidemia, and a significant family history for early coronary and cerebrovascular disease, who presented to the [facility] ED, chest pressure with associated shortness of breath of 3 days duration.  Chest pain described as pressure , left-sided, intermittent lasting 5 to 45 minutes.   Patient ruled out with serial enzymes and had a negative d-dimer.  Two-view chest x-ray was unremarkable.  Ultrasound showed hepatic steatosis.  Urinalysis significant for large amount of hemoglobin.  Patient observed overnight and ruled out for myocardial infarction by serial enzymes and EKG.  Cardiology was consulted and recommended a coronary CT scan.  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  I have spoken with cardiology who recommends aspirin 81 mg p.o. daily at discharge as well as Plavix 75 mg p.o. daily.  She will follow-up with Dr. Harrell Gave from cardiology.  Appointment has been placed in the AVS.  Patient has reached maximal benefit of hospitalization.  Discharge diagnosis, prognosis, plans, follow-up, medications and treatments discussed with the patient(or responsible party) and is in agreement with the plans as described.  Patient is stable for discharge.  Discharge Diagnoses:  Active Problems:   Chest pain with moderate risk for cardiac etiology   HTN  (hypertension)   HLD (hyperlipidemia)    Discharge Instructions  Discharge Instructions    Diet - low sodium heart healthy   Complete by:  As directed    Increase activity slowly   Complete by:  As directed       Follow-up Information    [name], MD. Go on 03/14/2018.   Specialty:  Cardiology Why:  @10 :20am for hospital follow up Contact information: [address] [place] [address] [phone]          Allergies  Allergen Reactions  . Codeine Nausea And Vomiting   Allergies as of 02/28/2018      Reactions   Codeine Nausea And Vomiting      Medication List    STOP taking these medications   ondansetron 4 MG tablet Commonly known as:  ZOFRAN     TAKE these medications   acetaminophen 500 MG tablet Commonly known as:  TYLENOL Take 1,000 mg by mouth every 6 (six) hours as needed for mild pain, moderate pain, fever or headache.   aspirin EC 81 MG tablet Take 1 tablet (81 mg total) by mouth daily.   cholecalciferol 1000 units tablet Commonly known as:  VITAMIN D Take 1,000 Units by mouth daily.   clopidogrel 75 MG tablet Commonly known as:  PLAVIX Take 1 tablet (75 mg total) by mouth daily.   CO Q 10 PO Take 1 tablet by mouth daily.   Fish Oil 1000 MG Caps Take 1,000 mg by mouth daily.   HYDROcodone-acetaminophen 5-325 MG tablet Commonly known as:  NORCO/VICODIN Take 2 tablets by mouth every 4 (four) hours as needed for moderate pain.   lisinopril-hydrochlorothiazide 20-25 MG tablet Commonly  known as:  PRINZIDE,ZESTORETIC Take 1 tablet by mouth daily.   lovastatin 20 MG tablet Commonly known as:  MEVACOR Take 20 mg by mouth at bedtime.   Melatonin 5 MG Chew Chew 5 mg by mouth at bedtime.   meloxicam 15 MG tablet Commonly known as:  MOBIC Take 15 mg by mouth daily.   multivitamin with minerals tablet Take 1 tablet by mouth daily.   nitroGLYCERIN 0.4 MG SL tablet Commonly known as:  NITROSTAT Place 1  tablet (0.4 mg total) under the tongue every 5 (five) minutes as needed.      Consultations:  Dr. Harrell Gave from cardiology   Procedures/Studies: Dg Chest 2 View  Result Date: 02/27/2018 CLINICAL DATA:  Intermittent chest pain and shortness of breath for 3 days. EXAM: CHEST - 2 VIEW COMPARISON:  None. FINDINGS: The lungs are clear. Heart size is normal. No pneumothorax or pleural effusion. No acute or focal bony abnormality. IMPRESSION: No acute disease. Electronically Signed   By: Inge Rise M.D.   On: 02/27/2018 15:28   Ct Coronary Morph W/cta Cor W/score W/ca W/cm &/or Wo/cm  Addendum Date: 02/28/2018   ADDENDUM REPORT: 02/28/2018 15:46 CLINICAL DATA:  67-year0old female with atypical chest pain. EXAM: Cardiac/Coronary  CT TECHNIQUE: The patient was scanned on a [employer]. FINDINGS: A 120 kV prospective scan was triggered in the descending thoracic aorta at 111 HU's. Axial non-contrast 3 mm slices were carried out through the heart. The data set was analyzed on a dedicated work station and scored using the [place]. Gantry rotation speed was 250 msecs and collimation was .6 mm. No beta blockade and 0.8 mg of sl NTG was given. The 3D data set was reconstructed in 5% intervals of the 67-82 % of the R-R cycle. Diastolic phases were analyzed on a dedicated work station using MPR, MIP and VRT modes. The patient received 80 cc of contrast. Aorta:  Normal size.  No calcifications.  No dissection. Aortic Valve:  Trileaflet.  No calcifications. Coronary Arteries:  Normal coronary origin.  Right dominance. RCA is a large dominant artery that gives rise to PDA and PLVB. 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 RCA post aneurysm has smaller lumen and mild mixed plaque with stenosis 25-50%. PDA and PLA have mild plaque. Left main is a large artery that gives rise to LAD and LCX arteries. Left main has no significant plaque. LAD is a large  vessel that has mild focal calcified plaque in the proximal and mid segment with stenosis 25-50%, distal LAD has small lumen. D1 has mild plaque. LCX is a non-dominant artery that gives rise to one large OM1 branch. There is minimal plaque. Other findings: Normal pulmonary vein drainage into the left atrium. Normal let atrial appendage without a thrombus. Normal size of the pulmonary artery. IMPRESSION: 1. Coronary calcium score of 32. This was 77 percentile for age and sex matched control. 2. Normal coronary origin with right dominance. 3. 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 diagonal artery. 4. 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 Dual antiplatelet therapy with aspirin and Plavix is indicated. A repeat CTA for evaluation of aneurysmal size is recommended in 6 months. CT FFR will be submitted. Electronically Signed   By: Ena Dawley   On: 02/28/2018 15:46   Result Date: 02/28/2018 EXAM: Tera Partridge  INTERPRETATION  CT CHEST The following report is an over-read performed by radiologist Dr. Rolm Baptise of [facility], [place] on 02/28/2018. This over-read does not include interpretation of cardiac or coronary anatomy or pathology. The coronary CTA interpretation by the cardiologist is attached. COMPARISON:  None. FINDINGS: Vascular: Heart is normal size.  Visualized aorta is normal caliber. Mediastinum/Nodes: No adenopathy in the lower mediastinum or hila. Lungs/Pleura: No confluent airspace opacities or effusions. Upper Abdomen: Imaging into the upper abdomen shows no acute findings. Musculoskeletal: Chest wall soft tissues are unremarkable. No acute bony abnormality. IMPRESSION: No acute or significant extracardiac abnormality. Electronically Signed: By: Rolm Baptise M.D. On: 02/28/2018 13:08   US Abdomen Limited Ruq  Result Date: 02/27/2018 CLINICAL DATA:  Right upper quadrant pain EXAM: ULTRASOUND ABDOMEN  LIMITED RIGHT UPPER QUADRANT COMPARISON:  CT 10/31/2014 FINDINGS: Gallbladder: No gallstones or wall thickening visualized. No sonographic Murphy sign noted by sonographer. Common bile duct: Diameter: 2.6 mm Liver: Increased hepatic echogenicity. Portal vein is patent on color Doppler imaging with normal direction of blood flow towards the liver. IMPRESSION: 1. Negative for gallstones or biliary dilatation 2. Increased hepatic echogenicity consistent with steatosis and or hepatocellular disease. Electronically Signed   By: Donavan Foil M.D.   On: 02/27/2018 17:19   Coronary CT scan which is preliminarily negative   Subjective:  Well with no new complaints  Discharge Exam: Vitals:   02/28/18 0939 02/28/18 1313  BP: (!) 141/71 123/64  Pulse: 61 (!) 58  Resp: 16 16  Temp: 98.1 F (36.7 C) 98.3 F (36.8 C)  SpO2: 95% 97%   Vitals:   02/27/18 2336 02/28/18 0542 02/28/18 0939 02/28/18 1313  BP: 122/63 (!) 125/58 (!) 141/71 123/64  Pulse: 60 (!) 55 61 (!) 58  Resp: 17 17 16 16   Temp: 98.1 F (36.7 C) 98.2 F (36.8 C) 98.1 F (36.7 C) 98.3 F (36.8 C)  TempSrc: Oral Oral Oral Oral  SpO2: 95% 91% 95% 97%  Weight:      Height:        General: Pt is alert, awake, not in acute distress Cardiovascular: RRR, S1/S2 +, no rubs, no gallops Respiratory: CTA bilaterally, no wheezing, no rhonchi Abdominal: Soft, NT, ND, bowel sounds + Extremities: no edema, no cyanosis    The results of significant diagnostics from this hospitalization (including imaging, microbiology, ancillary and laboratory) are listed below for reference.     Microbiology: No results found for this or any previous visit (from the past 240 hour(s)).   Labs: BNP (last 3 results) No results for input(s): BNP in the last 8760 hours. Basic Metabolic Panel: Recent Labs  Lab 02/27/18 1445 02/28/18 0345  NA 139 142  K 3.3* 4.2  CL 100 107  CO2 28 28  GLUCOSE 132* 106*  BUN 15 10  CREATININE 0.71 0.78   CALCIUM 8.9 8.8*  MG  --  2.0   Liver Function Tests: Recent Labs  Lab 02/27/18 1445  AST 26  ALT 23  ALKPHOS 64  BILITOT 0.6  PROT 7.2  ALBUMIN 4.0   Recent Labs  Lab 02/27/18 1445  LIPASE 37   No results for input(s): AMMONIA in the last 168 hours. CBC: Recent Labs  Lab 02/27/18 1445  WBC 4.9  HGB 13.9  HCT 42.4  MCV 92.8  PLT 230   Cardiac Enzymes: Recent Labs  Lab 02/27/18 1445 02/27/18 2203 02/28/18 0345  TROPONINI <0.03 <0.03 <0.03   BNP: Invalid input(s): POCBNP CBG: No results  for input(s): GLUCAP in the last 168 hours. D-Dimer Recent Labs    02/27/18 1445  DDIMER 0.31   Hgb A1c No results for input(s): HGBA1C in the last 72 hours. Lipid Profile Recent Labs    02/28/18 0345  CHOL 187  HDL 42  LDLCALC 104*  TRIG 204*  CHOLHDL 4.5   Thyroid function studies No results for input(s): TSH, T4TOTAL, T3FREE, THYROIDAB in the last 72 hours.  Invalid input(s): FREET3 Anemia work up No results for input(s): VITAMINB12, FOLATE, FERRITIN, TIBC, IRON, RETICCTPCT in the last 72 hours. Urinalysis    Component Value Date/Time   COLORURINE YELLOW 02/27/2018 [place] 02/27/2018 1541   LABSPEC 1.025 02/27/2018 1541   PHURINE 6.0 02/27/2018 1541   GLUCOSEU NEGATIVE 02/27/2018 1541   HGBUR MODERATE (A) 02/27/2018 1541   BILIRUBINUR NEGATIVE 02/27/2018 1541   KETONESUR NEGATIVE 02/27/2018 1541   PROTEINUR NEGATIVE 02/27/2018 1541   UROBILINOGEN 1.0 10/31/2014 1646   NITRITE NEGATIVE 02/27/2018 1541   LEUKOCYTESUR NEGATIVE 02/27/2018 1541   Sepsis Labs Invalid input(s): PROCALCITONIN,  WBC,  LACTICIDVEN Microbiology No results found for this or any previous visit (from the past 240 hour(s)).   Time coordinating discharge: 40 minutes  SIGNED:   Lady Deutscher, MD  FACP [facility] 02/28/2018, 4:15 PM Pager   If 7PM-7AM, please contact night-coverage [email or web address] Password [email or web address]

## 2018-02-28 NOTE — Progress Notes (Signed)
IV attempted. r ac. Pt tolerates well.

## 2018-02-28 NOTE — Progress Notes (Deleted)
Pt returns from echo and denies c/o questioning when she can eat.

## 2018-03-01 DIAGNOSIS — R079 Chest pain, unspecified: Secondary | ICD-10-CM

## 2018-03-03 ENCOUNTER — Encounter: Payer: Self-pay | Admitting: Cardiology

## 2018-03-03 ENCOUNTER — Ambulatory Visit (INDEPENDENT_AMBULATORY_CARE_PROVIDER_SITE_OTHER): Payer: Medicare Other | Admitting: Cardiology

## 2018-03-03 VITALS — BP 122/66 | HR 67 | Ht 65.0 in | Wt 264.0 lb

## 2018-03-03 DIAGNOSIS — I2541 Coronary artery aneurysm: Secondary | ICD-10-CM

## 2018-03-03 DIAGNOSIS — I10 Essential (primary) hypertension: Secondary | ICD-10-CM

## 2018-03-03 DIAGNOSIS — E663 Overweight: Secondary | ICD-10-CM | POA: Insufficient documentation

## 2018-03-03 DIAGNOSIS — E7800 Pure hypercholesterolemia, unspecified: Secondary | ICD-10-CM

## 2018-03-03 HISTORY — DX: Coronary artery aneurysm: I25.41

## 2018-03-03 HISTORY — DX: Overweight: E66.3

## 2018-03-03 MED ORDER — ROSUVASTATIN CALCIUM 10 MG PO TABS: 10.0000 mg | ORAL_TABLET | Freq: Every day | ORAL | 2 refills | Status: DC

## 2018-03-03 NOTE — Patient Instructions (Signed)
Medication Instructions:  Your physician has recommended you make the following change in your medication:  STOP lovastatin START crestor 10 mg daily  Labwork: Your physician recommends that you have the following labs drawn: Return to our office fasting in 6 weeks for a liver and lipid panel.  Testing/Procedures: None  Follow-Up: Your physician recommends that you schedule a follow-up appointment in: 6 months  Any Other Special Instructions Will Be Listed Below (If Applicable).     If you need a refill on your cardiac medications before your next appointment, please call your pharmacy.   C[place], RN, BSN  Rosuvastatin Tablets What is this medicine? ROSUVASTATIN (roe SOO va sta tin) is known as a HMG-CoA reductase inhibitor or 'statin'. It lowers cholesterol and triglycerides in the blood. This drug may also reduce the risk of heart attack, stroke, or other health problems in patients with risk factors for heart disease. Diet and lifestyle changes are often used with this drug. This medicine may be used for other purposes; ask your health care provider or pharmacist if you have questions. COMMON BRAND NAME(S): Crestor What should I tell my health care provider before I take this medicine? They need to know if you have any of these conditions: -frequently drink alcoholic beverages -kidney disease -liver disease -muscle aches or weakness -other medical condition -an unusual or allergic reaction to rosuvastatin, other medicines, foods, dyes, or preservatives -pregnant or trying to get pregnant -breast-feeding How should I use this medicine? Take this medicine by mouth with a glass of water. Follow the directions on the prescription label. Do not cut, crush or chew this medicine. You can take this medicine with or without food. Take your doses at regular intervals. Do not take your medicine more often than directed. Talk to your pediatrician regarding the use  of this medicine in children. While this drug may be prescribed for children as young as 25 years old for selected conditions, precautions do apply. Overdosage: If you think you have taken too much of this medicine contact a poison control center or emergency room at once. NOTE: This medicine is only for you. Do not share this medicine with others. What if I miss a dose? If you miss a dose, take it as soon as you can. Do not take 2 doses within 12 hours of each other. If there are less than 12 hours until your next dose, take only that dose. Do not take double or extra doses. What may interact with this medicine? Do not take this medicine with any of the following medications: -herbal medicines like red yeast rice This medicine may also interact with the following medications: -alcohol -antacids containing aluminum hydroxide or magnesium hydroxide -cyclosporine -other medicines for high cholesterol -some medicines for HIV infection -warfarin This list may not describe all possible interactions. Give your health care provider a list of all the medicines, herbs, non-prescription drugs, or dietary supplements you use. Also tell them if you smoke, drink alcohol, or use illegal drugs. Some items may interact with your medicine. What should I watch for while using this medicine? Visit your doctor or health care professional for regular check-ups. You may need regular tests to make sure your liver is working properly. Tell your doctor or health care professional right away if you get any unexplained muscle pain, tenderness, or weakness, especially if you also have a fever and tiredness. Your doctor or health care professional may tell you to stop taking this medicine  if you develop muscle problems. If your muscle problems do not go away after stopping this medicine, contact your health care professional. This medicine may affect blood sugar levels. If you have diabetes, check with your doctor or health  care professional before you change your diet or the dose of your diabetic medicine. Avoid taking antacids containing aluminum, calcium or magnesium within 2 hours of taking this medicine. This drug is only part of a total heart-health program. Your doctor or a dietician can suggest a low-cholesterol and low-fat diet to help. Avoid alcohol and smoking, and keep a proper exercise schedule. Do not use this drug if you are pregnant or breast-feeding. Serious side effects to an unborn child or to an infant are possible. Talk to your doctor or pharmacist for more information. What side effects may I notice from receiving this medicine? Side effects that you should report to your doctor or health care professional as soon as possible: -allergic reactions like skin rash, itching or hives, swelling of the face, lips, or tongue -dark urine -fever -joint pain -muscle cramps, pain -redness, blistering, peeling or loosening of the skin, including inside the mouth -trouble passing urine or change in the amount of urine -unusually weak or tired -yellowing of the eyes or skin Side effects that usually do not require medical attention (report to your doctor or health care professional if they continue or are bothersome): -constipation -heartburn -nausea -stomach gas, pain, upset This list may not describe all possible side effects. Call your doctor for medical advice about side effects. You may report side effects to FDA at 1-800-FDA-1088. Where should I keep my medicine? Keep out of the reach of children. Store at room temperature between 20 and 25 degrees C (68 and 77 degrees F). Keep container tightly closed (protect from moisture). Throw away any unused medicine after the expiration date. NOTE: This sheet is a summary. It may not cover all possible information. If you have questions about this medicine, talk to your doctor, pharmacist, or health care provider.  2018 Elsevier/Gold Standard (2014-11-21  13:33:08)

## 2018-03-03 NOTE — Progress Notes (Signed)
Cardiology Office Note:    Date:  03/03/2018   ID:  Brittany Middleton, DOB 1951/03/28, MRN [id number]  PCP:  London Pepper, MD  Cardiologist:  Jenean Lindau, MD   Referring MD: London Pepper, MD    ASSESSMENT:    1. Coronary aneurysm   2. Essential hypertension   3. Hypercholesterolemia    PLAN:    In order of problems listed above:  1. Primary prevention stressed with the patient.  Importance of compliance with diet and medication stressed and she vocalized understanding.  Diet was discussed for essential hypertension, obesity and dyslipidemia and risks of obesity explained and she vocalized understanding.  She promises to do better and lose weight in the next few months. 2. I mentioned to her about the coronary aneurysm.  There is no issues with flow past that aneurysm.  This was discussed with her at length.  I gave her the options of possible consultation for evaluation and possible surgery to fix this and she is vehemently against it as she is asymptomatic. 3. She is on clopidogrel and aspirin.  She is also on ACE inhibitor.  I will change her statin to more potent statin according to the current guidelines.  She will take rosuvastatin 10 mg daily.  She will be back in 6 weeks for liver lipid check. Patient will be seen in follow-up appointment in 6 months or earlier if the patient has any concerns .  He knows to go to the nearest emergency room for any significant concerns.    Medication Adjustments/Labs and Tests Ordered: Current medicines are reviewed at length with the patient today.  Concerns regarding medicines are outlined above.  No orders of the defined types were placed in this encounter.  No orders of the defined types were placed in this encounter.    History of Present Illness:    Brittany Middleton is a 67 y.o. female who is being seen today for the evaluation of coronary aneurysm at the request of London Pepper, MD.  Patient is a pleasant 67 year old female.   She has past medical history of essential hypertension and dyslipidemia and is overweight.  She mentions to me that she went to the hospital with chest tightness and was evaluated with coronary CT angiography.  This was unremarkable except she has a coronary aneurysm in the mid right coronary artery.  Medical therapy was recommended and she was initiated on clopidogrel and sent home.  She is here for follow-up.  She denies any problems at this time.  No chest pain orthopnea or PND.  At the time of my evaluation, the patient is alert awake oriented and in no distress.  She mentions to me that she exercises on a regular basis without any symptoms [facility].  Past Medical History:  Diagnosis Date  . Cervical disc disease    MRI 2016  . Hepatic steatosis    per imaging 2016, 2019  . History of palpitations    Holter monitor, 2017: NSR, occasional PVC, PACs, no arrhythmia  . Hypercholesteremia   . Hypertension   . Lumbar disc disease    MRI, 2017     Past Surgical History:  Procedure Laterality Date  . BLADDER REPAIR    . BREAST LUMPECTOMY Left   . CARPAL TUNNEL RELEASE Right   . CATARACT EXTRACTION, BILATERAL  2014  . REPAIR RECTOCELE      Current Medications: Current Meds  Medication Sig  .  acetaminophen (TYLENOL) 500 MG tablet Take 1,000 mg by mouth every 6 (six) hours as needed for mild pain, moderate pain, fever or headache.  Marland Kitchen aspirin EC 81 MG tablet Take 1 tablet (81 mg total) by mouth daily.  . cholecalciferol (VITAMIN D) 1000 UNITS tablet Take 1,000 Units by mouth daily.  . clopidogrel (PLAVIX) 75 MG tablet Take 1 tablet (75 mg total) by mouth daily.  . Coenzyme Q10 (CO Q 10 PO) Take 1 tablet by mouth daily.  Marland Kitchen lisinopril-hydrochlorothiazide (PRINZIDE,ZESTORETIC) 20-25 MG per tablet Take 1 tablet by mouth daily.  Marland Kitchen lovastatin (MEVACOR) 20 MG tablet Take 20 mg by mouth at bedtime.  . Melatonin 5 MG CHEW Chew 5 mg by mouth at bedtime.  .  meloxicam (MOBIC) 15 MG tablet Take 15 mg by mouth daily.  . Multiple Vitamins-Minerals (MULTIVITAMIN WITH MINERALS) tablet Take 1 tablet by mouth daily.  . nitroGLYCERIN (NITROSTAT) 0.4 MG SL tablet Place 1 tablet (0.4 mg total) under the tongue every 5 (five) minutes as needed.  . Omega-3 Fatty Acids (FISH OIL) 1000 MG CAPS Take 1,000 mg by mouth daily.  Marland Kitchen omeprazole (PRILOSEC) 20 MG capsule Take 20 mg by mouth daily.     Allergies:   Ciprofloxacin and Codeine   Social History   Socioeconomic History  . Marital status: Divorced    Spouse name: Not on file  . Number of children: 2  . Years of education: Not on file  . Highest education level: Master's degree (e.g., MA, MS, MEng, MEd, MSW, MBA)  Occupational History  . Occupation: Retired  Scientific laboratory technician  . Financial resource strain: Not hard at all  . Food insecurity:    Worry: Never true    Inability: Never true  . Transportation needs:    Medical: No    Non-medical: No  Tobacco Use  . Smoking status: Never Smoker  . Smokeless tobacco: Never Used  Substance and Sexual Activity  . Alcohol use: No  . Drug use: No  . Sexual activity: Not on file  Lifestyle  . Physical activity:    Days per week: 7 days    Minutes per session: 60 min  . Stress: Only a little  Relationships  . Social connections:    Talks on phone: More than three times a week    Gets together: More than three times a week    Attends religious service: Never    Active member of club or organization: Yes    Attends meetings of clubs or organizations: 1 to 4 times per year    Relationship status: Divorced  Other Topics Concern  . Not on file  Social History Narrative  . Not on file     Family History: The patient's family history includes CAD in her mother.  ROS:   Please see the history of present illness.    All other systems reviewed and are negative.  EKGs/Labs/Other Studies Reviewed:    The following studies were reviewed today: I  discussed my findings with the patient at length including lipids and coronary CT angiography report.   Recent Labs: 02/27/2018: ALT 23; Hemoglobin 13.9; Platelets 230 02/28/2018: BUN 10; Creatinine, Ser 0.78; Magnesium 2.0; Potassium 4.2; Sodium 142  Recent Lipid Panel    Component Value Date/Time   CHOL 187 02/28/2018 0345   TRIG 204 (H) 02/28/2018 0345   HDL 42 02/28/2018 0345   CHOLHDL 4.5 02/28/2018 0345   VLDL 41 (H) 02/28/2018 0345   LDLCALC 104 (H) 02/28/2018  0345    Physical Exam:    VS:  BP 122/66 (BP Location: Right Arm, Patient Position: Sitting, Cuff Size: Normal)   Pulse 67   Ht 5\' 5"  (1.651 m)   Wt 264 lb (119.7 kg)   SpO2 97%   BMI 43.93 kg/m     Wt Readings from Last 3 Encounters:  03/03/18 264 lb (119.7 kg)  02/27/18 264 lb (119.7 kg)  10/31/14 259 lb 12.8 oz (117.8 kg)     GEN: Patient is in no acute distress HEENT: Normal NECK: No JVD; No carotid bruits LYMPHATICS: No lymphadenopathy CARDIAC: S1 S2 regular, 2/6 systolic murmur at the apex. RESPIRATORY:  Clear to auscultation without rales, wheezing or rhonchi  ABDOMEN: Soft, non-tender, non-distended MUSCULOSKELETAL:  No edema; No deformity  SKIN: Warm and dry NEUROLOGIC:  Alert and oriented x 3 PSYCHIATRIC:  Normal affect    Signed, Jenean Lindau, MD  03/03/2018 11:04 AM    [place]

## 2018-03-14 ENCOUNTER — Ambulatory Visit: Payer: Medicare Other | Admitting: Cardiology

## 2018-04-12 ENCOUNTER — Other Ambulatory Visit: Payer: Self-pay | Admitting: Emergency Medicine

## 2018-04-12 DIAGNOSIS — E7800 Pure hypercholesterolemia, unspecified: Secondary | ICD-10-CM

## 2018-04-13 LAB — HEPATIC FUNCTION PANEL
ALT: 18 IU/L (ref 0–32)
AST: 24 IU/L (ref 0–40)
Albumin: 4 g/dL (ref 3.6–4.8)
Alkaline Phosphatase: 59 IU/L (ref 39–117)
Bilirubin Total: 0.4 mg/dL (ref 0.0–1.2)
Bilirubin, Direct: 0.13 mg/dL (ref 0.00–0.40)
Total Protein: 6.9 g/dL (ref 6.0–8.5)

## 2018-04-13 LAB — LIPID PANEL
Chol/HDL Ratio: 4.2 ratio (ref 0.0–4.4)
Cholesterol, Total: 198 mg/dL (ref 100–199)
HDL: 47 mg/dL (ref >39)
LDL Calculated: 89 mg/dL (ref 0–99)
Triglycerides: 308 mg/dL — ABNORMAL HIGH (ref 0–149)
VLDL Cholesterol Cal: 62 mg/dL — ABNORMAL HIGH (ref 5–40)

## 2018-04-25 ENCOUNTER — Telehealth: Payer: Self-pay

## 2018-04-25 NOTE — Telephone Encounter (Signed)
Left voicemail for the patient to call the office and make an appointment to discuss results per the request of Dr. Geraldo Pitter.

## 2018-04-26 NOTE — Telephone Encounter (Signed)
Left voicemail for the patient to call the office regrding results.

## 2018-05-10 ENCOUNTER — Ambulatory Visit: Payer: Medicare Other | Admitting: Cardiology

## 2018-05-22 ENCOUNTER — Ambulatory Visit (INDEPENDENT_AMBULATORY_CARE_PROVIDER_SITE_OTHER): Payer: Medicare Other | Admitting: Cardiology

## 2018-05-22 ENCOUNTER — Encounter: Payer: Self-pay | Admitting: Cardiology

## 2018-05-22 VITALS — BP 122/66 | HR 56 | Ht 65.0 in | Wt 268.0 lb

## 2018-05-22 DIAGNOSIS — E66813 Obesity, class 3: Secondary | ICD-10-CM

## 2018-05-22 DIAGNOSIS — I2541 Coronary artery aneurysm: Secondary | ICD-10-CM | POA: Diagnosis not present

## 2018-05-22 DIAGNOSIS — I10 Essential (primary) hypertension: Secondary | ICD-10-CM

## 2018-05-22 DIAGNOSIS — E782 Mixed hyperlipidemia: Secondary | ICD-10-CM

## 2018-05-22 DIAGNOSIS — C482 Malignant neoplasm of peritoneum, unspecified: Secondary | ICD-10-CM | POA: Insufficient documentation

## 2018-05-22 DIAGNOSIS — E669 Obesity, unspecified: Secondary | ICD-10-CM | POA: Insufficient documentation

## 2018-05-22 HISTORY — DX: Obesity, class 3: E66.813

## 2018-05-22 HISTORY — DX: Morbid (severe) obesity due to excess calories: E66.01

## 2018-05-22 NOTE — Progress Notes (Signed)
Cardiology Office Note:    Date:  05/22/2018   ID:  Brittany Middleton, DOB 02-12-1951, MRN [id number]  PCP:  London Pepper, MD  Cardiologist:  Jenean Lindau, MD   Referring MD: London Pepper, MD    ASSESSMENT:    1. Coronary aneurysm   2. Essential hypertension   3. Mixed hyperlipidemia   4. Morbid obesity ([place])    PLAN:    In order of problems listed above:  1. Primary prevention stressed with the patient.  Importance of compliance with diet and medication stressed and she verbalized understanding.  Her blood pressure stable.  Diet was discussed for obesity and risks were explained and she plans to do better.  She has joined [employer] and is very cautiously watching her diet and exercising regularly. 2. She tells me that her blood work was elevated as well as triglycerides were concerned because she was not fasting for 12 hours at any rate she is working well with her diet now and will be back in 3 months for liver lipid 3. As in the past I gave her an option for the possible surgical evaluation of coronary aneurysm repair and she understands benefits and risks and is not keen on it at this time.  She will continue medical therapy.  I respect her wishes. 4. Patient will be seen in follow-up appointment in 6 months or earlier if the patient has any concerns    Medication Adjustments/Labs and Tests Ordered: Current medicines are reviewed at length with the patient today.  Concerns regarding medicines are outlined above.  No orders of the defined types were placed in this encounter.  No orders of the defined types were placed in this encounter.    Chief Complaint  Patient presents with  . Follow-up     History of Present Illness:    Brittany Middleton is a 67 y.o. female.  She has history of coronary artery aneurysm which is large in the right coronary artery.  Interestingly she tells me that she was visiting [place] and has brought records which shows a aneurysm  very similar to what was described in this coronary angiography report.  This was done several years ago.  She denies any problems at this time.  No chest pain orthopnea or PND.  She leads a sedentary lifestyle and has been lax with her diet.  At the time of my evaluation, the patient is alert awake oriented and in no distress.  Past Medical History:  Diagnosis Date  . Cervical disc disease    MRI 2016  . Hepatic steatosis    per imaging 2016, 2019  . History of palpitations    Holter monitor, 2017: NSR, occasional PVC, PACs, no arrhythmia  . Hypercholesteremia   . Hypertension   . Lumbar disc disease    MRI, 2017     Past Surgical History:  Procedure Laterality Date  . BLADDER REPAIR    . BREAST LUMPECTOMY Left   . CARPAL TUNNEL RELEASE Right   . CATARACT EXTRACTION, BILATERAL  2014  . REPAIR RECTOCELE      Current Medications: Current Meds  Medication Sig  . acetaminophen (TYLENOL) 500 MG tablet Take 1,000 mg by mouth every 6 (six) hours as needed for mild pain, moderate pain, fever or headache.  Marland Kitchen aspirin EC 81 MG tablet Take 1 tablet (81 mg total) by mouth daily.  . cholecalciferol (VITAMIN D) 1000 UNITS tablet Take 1,000 Units by mouth daily.  . clopidogrel (PLAVIX)  75 MG tablet Take 1 tablet (75 mg total) by mouth daily.  . Coenzyme Q10 (CO Q 10 PO) Take 1 tablet by mouth daily.  Marland Kitchen lisinopril-hydrochlorothiazide (PRINZIDE,ZESTORETIC) 20-25 MG per tablet Take 1 tablet by mouth daily.  . Melatonin 5 MG CHEW Chew 5 mg by mouth at bedtime.  . Multiple Vitamins-Minerals (MULTIVITAMIN WITH MINERALS) tablet Take 1 tablet by mouth daily.  . nitroGLYCERIN (NITROSTAT) 0.4 MG SL tablet Place 1 tablet (0.4 mg total) under the tongue every 5 (five) minutes as needed.  . Omega-3 Fatty Acids (FISH OIL) 1000 MG CAPS Take 1,000 mg by mouth daily.  Marland Kitchen omeprazole (PRILOSEC) 20 MG capsule Take 20 mg by mouth daily.  . rosuvastatin (CRESTOR) 10 MG tablet Take 1 tablet (10 mg total) by mouth  daily.     Allergies:   Ciprofloxacin and Codeine   Social History   Socioeconomic History  . Marital status: Divorced    Spouse name: Not on file  . Number of children: 2  . Years of education: Not on file  . Highest education level: Master's degree (e.g., MA, MS, MEng, MEd, MSW, MBA)  Occupational History  . Occupation: Retired  Scientific laboratory technician  . Financial resource strain: Not hard at all  . Food insecurity:    Worry: Never true    Inability: Never true  . Transportation needs:    Medical: No    Non-medical: No  Tobacco Use  . Smoking status: Never Smoker  . Smokeless tobacco: Never Used  Substance and Sexual Activity  . Alcohol use: No  . Drug use: No  . Sexual activity: Not on file  Lifestyle  . Physical activity:    Days per week: 7 days    Minutes per session: 60 min  . Stress: Only a little  Relationships  . Social connections:    Talks on phone: More than three times a week    Gets together: More than three times a week    Attends religious service: Never    Active member of club or organization: Yes    Attends meetings of clubs or organizations: 1 to 4 times per year    Relationship status: Divorced  Other Topics Concern  . Not on file  Social History Narrative  . Not on file     Family History: The patient's family history includes CAD in her mother.  ROS:   Please see the history of present illness.    All other systems reviewed and are negative.  EKGs/Labs/Other Studies Reviewed:    The following studies were reviewed today: I reviewed records from the other hospital and discussed with the patient at length.   Recent Labs: 02/27/2018: Hemoglobin 13.9; Platelets 230 02/28/2018: BUN 10; Creatinine, Ser 0.78; Magnesium 2.0; Potassium 4.2; Sodium 142 04/12/2018: ALT 18  Recent Lipid Panel    Component Value Date/Time   CHOL 198 04/12/2018 1430   TRIG 308 (H) 04/12/2018 1430   HDL 47 04/12/2018 1430   CHOLHDL 4.2 04/12/2018 1430   CHOLHDL 4.5  02/28/2018 0345   VLDL 41 (H) 02/28/2018 0345   LDLCALC 89 04/12/2018 1430    Physical Exam:    VS:  BP 122/66 (BP Location: Right Arm, Patient Position: Sitting, Cuff Size: Normal)   Pulse (!) 56   Ht 5\' 5"  (1.651 m)   Wt 268 lb (121.6 kg)   SpO2 96%   BMI 44.60 kg/m     Wt Readings from Last 3 Encounters:  05/22/18 268 lb (  121.6 kg)  03/03/18 264 lb (119.7 kg)  02/27/18 264 lb (119.7 kg)     GEN: Patient is in no acute distress HEENT: Normal NECK: No JVD; No carotid bruits LYMPHATICS: No lymphadenopathy CARDIAC: Hear sounds regular, 2/6 systolic murmur at the apex. RESPIRATORY:  Clear to auscultation without rales, wheezing or rhonchi  ABDOMEN: Soft, non-tender, non-distended MUSCULOSKELETAL:  No edema; No deformity  SKIN: Warm and dry NEUROLOGIC:  Alert and oriented x 3 PSYCHIATRIC:  Normal affect   Signed, Jenean Lindau, MD  05/22/2018 9:59 AM    [place]

## 2018-05-22 NOTE — Patient Instructions (Signed)
Medication Instructions:  Your physician recommends that you continue on your current medications as directed. Please refer to the Current Medication list given to you today.  If you need a refill on your cardiac medications before your next appointment, please call your pharmacy.   Lab work: Your physician recommends that you return for lab work in 3 months: BMP,LFT, and Lipid Profile If you have labs (blood work) drawn today and your tests are completely normal, you will receive your results only by: Marland Kitchen MyChart Message (if you have MyChart) OR . A paper copy in the mail If you have any lab test that is abnormal or we need to change your treatment, we will call you to review the results.  Testing/Procedures: None  Follow-Up: At [facility], you and your health needs are our priority.  As part of our continuing mission to provide you with exceptional heart care, we have created designated Provider Care Teams.  These Care Teams include your primary Cardiologist (physician) and Advanced Practice Providers (APPs -  Physician Assistants and Nurse Practitioners) who all work together to provide you with the care you need, when you need it. You will need a follow up appointment in 6 months.  Please call our office 2 months in advance to schedule this appointment.  You may see No primary care provider on file. or another member of our [employer] in [place]: Jenne Campus, MD . Shirlee More, MD .   Any Other Special Instructions Will Be Listed Below (If Applicable).

## 2018-05-24 ENCOUNTER — Other Ambulatory Visit: Payer: Self-pay | Admitting: Family Medicine

## 2018-05-24 ENCOUNTER — Ambulatory Visit
Admission: RE | Admit: 2018-05-24 | Discharge: 2018-05-24 | Disposition: A | Discharge status: Home / Self Care | Payer: Medicare Other | Source: Ambulatory Visit | Attending: Family Medicine | Admitting: Family Medicine

## 2018-05-24 DIAGNOSIS — Z1231 Encounter for screening mammogram for malignant neoplasm of breast: Secondary | ICD-10-CM

## 2018-07-16 ENCOUNTER — Encounter (HOSPITAL_COMMUNITY): Payer: Self-pay | Admitting: Pharmacy Technician

## 2018-07-16 ENCOUNTER — Other Ambulatory Visit: Payer: Self-pay

## 2018-07-16 ENCOUNTER — Observation Stay (HOSPITAL_COMMUNITY)
Admission: EM | Admit: 2018-07-16 | Discharge: 2018-07-19 | Disposition: A | Discharge status: Home / Self Care | Payer: Medicare Other | Attending: Internal Medicine | Admitting: Internal Medicine

## 2018-07-16 ENCOUNTER — Emergency Department (HOSPITAL_COMMUNITY): Payer: Medicare Other

## 2018-07-16 DIAGNOSIS — Z881 Allergy status to other antibiotic agents status: Secondary | ICD-10-CM | POA: Insufficient documentation

## 2018-07-16 DIAGNOSIS — K5730 Diverticulosis of large intestine without perforation or abscess without bleeding: Secondary | ICD-10-CM | POA: Insufficient documentation

## 2018-07-16 DIAGNOSIS — G4730 Sleep apnea, unspecified: Secondary | ICD-10-CM | POA: Diagnosis not present

## 2018-07-16 DIAGNOSIS — K922 Gastrointestinal hemorrhage, unspecified: Secondary | ICD-10-CM | POA: Diagnosis present

## 2018-07-16 DIAGNOSIS — Z7902 Long term (current) use of antithrombotics/antiplatelets: Secondary | ICD-10-CM | POA: Diagnosis not present

## 2018-07-16 DIAGNOSIS — D62 Acute posthemorrhagic anemia: Secondary | ICD-10-CM | POA: Diagnosis present

## 2018-07-16 DIAGNOSIS — Z7982 Long term (current) use of aspirin: Secondary | ICD-10-CM | POA: Diagnosis not present

## 2018-07-16 DIAGNOSIS — Z79899 Other long term (current) drug therapy: Secondary | ICD-10-CM | POA: Diagnosis not present

## 2018-07-16 DIAGNOSIS — K449 Diaphragmatic hernia without obstruction or gangrene: Secondary | ICD-10-CM

## 2018-07-16 DIAGNOSIS — I2510 Atherosclerotic heart disease of native coronary artery without angina pectoris: Secondary | ICD-10-CM | POA: Insufficient documentation

## 2018-07-16 DIAGNOSIS — K640 First degree hemorrhoids: Secondary | ICD-10-CM | POA: Insufficient documentation

## 2018-07-16 DIAGNOSIS — Z6841 Body Mass Index (BMI) 40.0 and over, adult: Secondary | ICD-10-CM | POA: Diagnosis not present

## 2018-07-16 DIAGNOSIS — K921 Melena: Principal | ICD-10-CM | POA: Diagnosis present

## 2018-07-16 DIAGNOSIS — Z885 Allergy status to narcotic agent status: Secondary | ICD-10-CM | POA: Insufficient documentation

## 2018-07-16 DIAGNOSIS — I2541 Coronary artery aneurysm: Secondary | ICD-10-CM | POA: Insufficient documentation

## 2018-07-16 DIAGNOSIS — E876 Hypokalemia: Secondary | ICD-10-CM | POA: Diagnosis present

## 2018-07-16 DIAGNOSIS — E7800 Pure hypercholesterolemia, unspecified: Secondary | ICD-10-CM | POA: Insufficient documentation

## 2018-07-16 DIAGNOSIS — I10 Essential (primary) hypertension: Secondary | ICD-10-CM | POA: Diagnosis present

## 2018-07-16 HISTORY — DX: Diaphragmatic hernia without obstruction or gangrene: K44.9

## 2018-07-16 HISTORY — DX: Melena: K92.1

## 2018-07-16 LAB — COMPREHENSIVE METABOLIC PANEL
ALT: 22 U/L (ref 0–44)
AST: 28 U/L (ref 15–41)
Albumin: 4 g/dL (ref 3.5–5.0)
Alkaline Phosphatase: 45 U/L (ref 38–126)
Anion gap: 12 (ref 5–15)
BUN: 16 mg/dL (ref 8–23)
CO2: 28 mmol/L (ref 22–32)
Calcium: 9 mg/dL (ref 8.9–10.3)
Chloride: 100 mmol/L (ref 98–111)
Creatinine, Ser: 0.74 mg/dL (ref 0.44–1.00)
GFR calc Af Amer: >60 mL/min (ref >60)
GFR calc non Af Amer: >60 mL/min (ref >60)
Glucose, Bld: 108 mg/dL — ABNORMAL HIGH (ref 70–99)
Potassium: 3.5 mmol/L (ref 3.5–5.1)
Sodium: 140 mmol/L (ref 135–145)
Total Bilirubin: 0.6 mg/dL (ref 0.3–1.2)
Total Protein: 6.8 g/dL (ref 6.5–8.1)

## 2018-07-16 LAB — CBC WITH DIFFERENTIAL/PLATELET
Abs Immature Granulocytes: 0.02 10*3/uL (ref 0.00–0.07)
Basophils Absolute: 0.1 10*3/uL (ref 0.0–0.1)
Basophils Relative: 1 %
Eosinophils Absolute: 0.2 10*3/uL (ref 0.0–0.5)
Eosinophils Relative: 4 %
HCT: 40.7 % (ref 36.0–46.0)
Hemoglobin: 12.7 g/dL (ref 12.0–15.0)
Immature Granulocytes: 0 %
Lymphocytes Relative: 26 %
Lymphs Abs: 1.4 10*3/uL (ref 0.7–4.0)
MCH: 29 pg (ref 26.0–34.0)
MCHC: 31.2 g/dL (ref 30.0–36.0)
MCV: 92.9 fL (ref 80.0–100.0)
Monocytes Absolute: 0.5 10*3/uL (ref 0.1–1.0)
Monocytes Relative: 10 %
Neutro Abs: 3.2 10*3/uL (ref 1.7–7.7)
Neutrophils Relative %: 59 %
Platelets: 195 10*3/uL (ref 150–400)
RBC: 4.38 MIL/uL (ref 3.87–5.11)
RDW: 13.2 % (ref 11.5–15.5)
WBC: 5.4 10*3/uL (ref 4.0–10.5)
nRBC: 0 % (ref 0.0–0.2)

## 2018-07-16 LAB — TYPE AND SCREEN
ABO/RH(D): B NEG
Antibody Screen: NEGATIVE

## 2018-07-16 LAB — PROTIME-INR
INR: 1.02
Prothrombin Time: 13.3 seconds (ref 11.4–15.2)

## 2018-07-16 LAB — ABO/RH: ABO/RH(D): B NEG

## 2018-07-16 MED ORDER — SODIUM CHLORIDE 0.9 % IV SOLN
INTRAVENOUS | Status: DC
  Administered 2018-07-17: 02:00:00 via INTRAVENOUS

## 2018-07-16 MED ORDER — ACETAMINOPHEN 650 MG RE SUPP: 650.0000 mg | Freq: Four times a day (QID) | RECTAL | Status: DC | PRN

## 2018-07-16 MED ORDER — ONDANSETRON HCL 4 MG PO TABS: 4.0000 mg | ORAL_TABLET | Freq: Four times a day (QID) | ORAL | Status: DC | PRN

## 2018-07-16 MED ORDER — ACETAMINOPHEN 325 MG PO TABS: 650.0000 mg | ORAL_TABLET | Freq: Four times a day (QID) | ORAL | Status: DC | PRN

## 2018-07-16 MED ORDER — ONDANSETRON HCL 4 MG/2ML IJ SOLN: 4.0000 mg | Freq: Four times a day (QID) | INTRAMUSCULAR | Status: DC | PRN

## 2018-07-16 MED ORDER — SODIUM CHLORIDE 0.9 % IV SOLN
80.0000 mg | Freq: Once | INTRAVENOUS | Status: AC
  Administered 2018-07-16: 80 mg via INTRAVENOUS
  Filled 2018-07-16: qty 80

## 2018-07-16 MED ORDER — SODIUM CHLORIDE 0.9 % IV SOLN
8.0000 mg/h | INTRAVENOUS | Status: DC
  Administered 2018-07-16 – 2018-07-18 (×4): 8 mg/h via INTRAVENOUS
  Filled 2018-07-16 (×9): qty 80

## 2018-07-16 NOTE — ED Provider Notes (Signed)
[facility] EMERGENCY DEPARTMENT Provider Note   CSN: [id number] Arrival date & time: 07/16/18  1718     History   Chief Complaint Chief Complaint  Patient presents with  . Rectal Bleeding    HPI Brittany Middleton is a 68 y.o. female.  HPI Patient presents with GI bleeding.  States she has had 5 episodes today of black tarry stool with some blood.  Some blood clots 2.  Has had some right upper quadrant abdominal pain.  She is on Plavix for a coronary aneurysm.  No previous GI bleeding but she is on Prilosec.  No nausea or vomiting.  No fevers.  She did bump her right midabdomen earlier today and has a bruise.  No chest pain.  No lightheadedness or dizziness. Past Medical History:  Diagnosis Date  . Cervical disc disease    MRI 2016  . Hepatic steatosis    per imaging 2016, 2019  . History of palpitations    Holter monitor, 2017: NSR, occasional PVC, PACs, no arrhythmia  . Hypercholesteremia   . Hypertension   . Lumbar disc disease    MRI, 2017     Patient Active Problem List   Diagnosis Date Noted  . Morbid obesity ([place]) 05/22/2018  . Overweight 03/03/2018  . 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 Chest pain with moderate risk for cardiac etiology 02/27/2018  . HTN (hypertension) 02/27/2018  . HLD (hyperlipidemia) 02/27/2018  . Hypercholesterolemia 04/07/2017  . Hypertensive disorder 04/07/2017  . Palpitations 08/19/2015    Past Surgical History:  Procedure Laterality Date  . BLADDER REPAIR    . BREAST EXCISIONAL BIOPSY Left   . CARPAL TUNNEL RELEASE Right   . CATARACT EXTRACTION, BILATERAL  2014  . REPAIR RECTOCELE       OB History   No obstetric history on file.      Home Medications    Prior to Admission medications   Medication Sig Start Date End Date Taking? Authorizing Provider  acetaminophen (TYLENOL) 500 MG tablet Take 1,000 mg by mouth every 6 (six) hours as needed for mild pain, moderate pain, fever or headache.    [provider]  aspirin EC 81 MG tablet Take 1 tablet (81 mg total) by mouth daily. 02/28/18 02/28/19  Lady Deutscher, MD  cholecalciferol (VITAMIN D) 1000 UNITS tablet Take 1,000 Units by mouth daily.    [provider]  clopidogrel (PLAVIX) 75 MG tablet Take 1 tablet (75 mg total) by mouth daily. 02/28/18 02/28/19  Lady Deutscher, MD  Coenzyme Q10 (CO Q 10 PO) Take 1 tablet by mouth daily.    [provider]  lisinopril-hydrochlorothiazide (PRINZIDE,ZESTORETIC) 20-25 MG per tablet Take 1 tablet by mouth daily.    [provider]  Melatonin 5 MG CHEW Chew 5 mg by mouth at bedtime.    [provider]  Multiple Vitamins-Minerals (MULTIVITAMIN WITH MINERALS) tablet Take 1 tablet by mouth daily.    [provider]  nitroGLYCERIN (NITROSTAT) 0.4 MG SL tablet Place 1 tablet (0.4 mg total) under the tongue every 5 (five) minutes as needed. 02/28/18   Bhagat, Crista Luria, PA  Omega-3 Fatty Acids (FISH OIL) 1000 MG CAPS Take 1,000 mg by mouth daily.    [provider]  omeprazole (PRILOSEC) 20 MG capsule Take 20 mg by mouth daily.    [provider]  rosuvastatin (CRESTOR) 10 MG tablet Take 1 tablet (10 mg total) by mouth daily. 03/03/18 06/01/18  Revankar, [name], MD  Family History Family History  Problem Relation Age of Onset  . CAD Mother   . Breast cancer Mother 103    Social History Social History   Tobacco Use  . Smoking status: Never Smoker  . Smokeless tobacco: Never Used  Substance Use Topics  . Alcohol use: No  . Drug use: No     Allergies   Ciprofloxacin and Codeine   Review of Systems Review of Systems  Constitutional: Negative for appetite change.  HENT: Negative for congestion.   Respiratory: Negative for shortness of breath.   Gastrointestinal: Positive for abdominal pain and blood in stool. Negative for nausea and vomiting.  Genitourinary: Negative for flank pain.  Musculoskeletal: Negative for back  pain.  Skin: Negative for rash.  Neurological: Negative for weakness.  Hematological: Bruises/bleeds easily.  Psychiatric/Behavioral: Negative for confusion.     Physical Exam Updated Vital Signs BP (!) 161/88   Pulse 64   Temp 98.3 F (36.8 C)   Resp 16   SpO2 99%   Physical Exam Constitutional:      Appearance: Normal appearance.  HENT:     Mouth/Throat:     Mouth: Mucous membranes are moist.  Eyes:     Extraocular Movements: Extraocular movements intact.  Neck:     Musculoskeletal: Neck supple.  Cardiovascular:     Rate and Rhythm: Normal rate and regular rhythm.  Abdominal:     Tenderness: There is abdominal tenderness.     Comments: Mild right mid and right upper quadrant tenderness without rebound or guarding.  There is right mid abdominal ecchymosis.  Skin:    Capillary Refill: Capillary refill takes less than 2 seconds.     Coloration: Skin is not jaundiced.  Neurological:     General: No focal deficit present.     Mental Status: She is alert.  Psychiatric:        Mood and Affect: Mood normal.      ED Treatments / Results  Labs (all labs ordered are listed, but only abnormal results are displayed) Labs Reviewed  COMPREHENSIVE METABOLIC PANEL - Abnormal; Notable for the following components:      Result Value   Glucose, Bld 108 (*)    All other components within normal limits  PROTIME-INR  CBC WITH DIFFERENTIAL/PLATELET  TYPE AND SCREEN    EKG None  Radiology Dg Abdomen Acute W/chest  Result Date: 07/16/2018 CLINICAL DATA:  Dark stools with abdominal pain EXAM: DG ABDOMEN ACUTE W/ 1V CHEST COMPARISON:  02/27/2018 FINDINGS: Cardiac shadows within normal limits. The lungs are well aerated bilaterally. No focal infiltrate or sizable effusion is seen. Scattered large and small bowel gas is noted. Mild retained fecal material is noted primarily within the right colon. No obstructive changes are seen. No free air is noted. Degenerative changes of the  lumbar spine are seen. IMPRESSION: Mild retained fecal material.  No acute abnormality noted. Electronically Signed   By: Inez Catalina M.D.   On: 07/16/2018 18:58    Procedures Procedures (including critical care time)  Medications Ordered in ED Medications  0.9 %  sodium chloride infusion (has no administration in time range)  pantoprazole (PROTONIX) 80 mg in sodium chloride 0.9 % 100 mL IVPB (has no administration in time range)  pantoprazole (PROTONIX) 80 mg in sodium chloride 0.9 % 250 mL (0.32 mg/mL) infusion (has no administration in time range)     Initial Impression / Assessment and Plan / ED Course  I have reviewed the triage vital signs  and the nursing notes.  Pertinent labs & imaging results that were available during my care of the patient were reviewed by me and considered in my medical decision making (see chart for details).     Patient presents with GI bleeding.  Likely upper GI bleed and that it is melanotic to start still has purplish blood.  Also some dull upper abdominal pain.  Has reassuring hemoglobin and reassuring vitals but is on Plavix.  Dr. Alessandra Bevels from [facility] will see patient tomorrow.  Started on Protonix drip due to likely GI bleed and high risk with the Plavix.  Will admit to hospitalist.  Final Clinical Impressions(s) / ED Diagnoses   Final diagnoses:  Upper GI bleeding    ED Discharge Orders    None       Davonna Belling, MD 07/16/18 1942

## 2018-07-16 NOTE — H&P (Signed)
History and Physical    Brittany Middleton KKX:[id number] DOB: [birth year] DOA: 07/16/2018  PCP: Maurice Small, MD  Patient coming from: Home  I have personally briefly reviewed patient's old medical records in [place]  Chief Complaint: Melena  HPI: Brittany Middleton is a 68 y.o. female with medical history significant of morbid obesity, HTN.  Patient presents to the ED with c/o 5 episodes of black tarry stool with some blood today.  Has had some RUQ pain since she bumped abdomen earlier today on table and has a bruise.  No CP, no lightheadedness.   ED Course: HGB 12.x.   Review of Systems: As per HPI otherwise 10 point review of systems negative.   Past Medical History:  Diagnosis Date  . Cervical disc disease    MRI 2016  . Hepatic steatosis    per imaging 2016, 2019  . History of palpitations    Holter monitor, 2017: NSR, occasional PVC, PACs, no arrhythmia  . Hypercholesteremia   . Hypertension   . Lumbar disc disease    MRI, 2017     Past Surgical History:  Procedure Laterality Date  . BLADDER REPAIR    . BREAST EXCISIONAL BIOPSY Left   . CARPAL TUNNEL RELEASE Right   . CATARACT EXTRACTION, BILATERAL  2014  . REPAIR RECTOCELE       reports that she has never smoked. She has never used smokeless tobacco. She reports that she does not drink alcohol or use drugs.  Allergies  Allergen Reactions  . Ciprofloxacin     PAIN IN HANDS   . Codeine Nausea And Vomiting    Family History  Problem Relation Age of Onset  . CAD Mother   . Breast cancer Mother 82     Prior to Admission medications   Medication Sig Start Date End Date Taking? Authorizing Provider  acetaminophen (TYLENOL) 500 MG tablet Take 1,000 mg by mouth every 6 (six) hours as needed for mild pain, moderate pain, fever or headache.    [provider]  aspirin EC 81 MG tablet Take 1 tablet (81 mg total) by mouth daily. 02/28/18 02/28/19  Lady Deutscher, MD  cholecalciferol  (VITAMIN D) 1000 UNITS tablet Take 1,000 Units by mouth daily.    [provider]  clopidogrel (PLAVIX) 75 MG tablet Take 1 tablet (75 mg total) by mouth daily. 02/28/18 02/28/19  Lady Deutscher, MD  Coenzyme Q10 (CO Q 10 PO) Take 1 tablet by mouth daily.    [provider]  lisinopril-hydrochlorothiazide (PRINZIDE,ZESTORETIC) 20-25 MG per tablet Take 1 tablet by mouth daily.    [provider]  Melatonin 5 MG CHEW Chew 5 mg by mouth at bedtime.    [provider]  Multiple Vitamins-Minerals (MULTIVITAMIN WITH MINERALS) tablet Take 1 tablet by mouth daily.    [provider]  nitroGLYCERIN (NITROSTAT) 0.4 MG SL tablet Place 1 tablet (0.4 mg total) under the tongue every 5 (five) minutes as needed. 02/28/18   Bhagat, Crista Luria, PA  Omega-3 Fatty Acids (FISH OIL) 1000 MG CAPS Take 1,000 mg by mouth daily.    [provider]  omeprazole (PRILOSEC) 20 MG capsule Take 20 mg by mouth daily.    [provider]  rosuvastatin (CRESTOR) 10 MG tablet Take 1 tablet (10 mg total) by mouth daily. 03/03/18 06/01/18  RevankarReita Cliche, MD    Physical Exam: Vitals:   07/16/18 1745 07/16/18 1753  BP: (!) 161/73 (!) 161/88  Pulse: Marland Kitchen)  57 64  Resp:  16  Temp:  98.3 F (36.8 C)  SpO2: 98% 99%    Constitutional: NAD, calm, comfortable Eyes: PERRL, lids and conjunctivae normal ENMT: Mucous membranes are moist. Posterior pharynx clear of any exudate or lesions.Normal dentition.  Neck: normal, supple, no masses, no thyromegaly Respiratory: clear to auscultation bilaterally, no wheezing, no crackles. Normal respiratory effort. No accessory muscle use.  Cardiovascular: Regular rate and rhythm, no murmurs / rubs / gallops. No extremity edema. 2+ pedal pulses. No carotid bruits.  Abdomen: Mild RUQ TTP and ecchymosis Musculoskeletal: no clubbing / cyanosis. No joint deformity upper and lower extremities. Good ROM, no contractures. Normal muscle tone.    Skin: no rashes, lesions, ulcers. No induration Neurologic: CN 2-12 grossly intact. Sensation intact, DTR normal. Strength 5/5 in all 4.  Psychiatric: Normal judgment and insight. Alert and oriented x 3. Normal mood.    Labs on Admission: I have personally reviewed following labs and imaging studies  CBC: Recent Labs  Lab 07/16/18 1748  WBC 5.4  NEUTROABS 3.2  HGB 12.7  HCT 40.7  MCV 92.9  PLT 409   Basic Metabolic Panel: Recent Labs  Lab 07/16/18 1748  NA 140  K 3.5  CL 100  CO2 28  GLUCOSE 108*  BUN 16  CREATININE 0.74  CALCIUM 9.0   GFR: CrCl cannot be calculated (Unknown ideal weight.). Liver Function Tests: Recent Labs  Lab 07/16/18 1748  AST 28  ALT 22  ALKPHOS 45  BILITOT 0.6  PROT 6.8  ALBUMIN 4.0   No results for input(s): LIPASE, AMYLASE in the last 168 hours. No results for input(s): AMMONIA in the last 168 hours. Coagulation Profile: Recent Labs  Lab 07/16/18 1748  INR 1.02   Cardiac Enzymes: No results for input(s): CKTOTAL, CKMB, CKMBINDEX, TROPONINI in the last 168 hours. BNP (last 3 results) No results for input(s): PROBNP in the last 8760 hours. HbA1C: No results for input(s): HGBA1C in the last 72 hours. CBG: No results for input(s): GLUCAP in the last 168 hours. Lipid Profile: No results for input(s): CHOL, HDL, LDLCALC, TRIG, CHOLHDL, LDLDIRECT in the last 72 hours. Thyroid Function Tests: No results for input(s): TSH, T4TOTAL, FREET4, T3FREE, THYROIDAB in the last 72 hours. Anemia Panel: No results for input(s): VITAMINB12, FOLATE, FERRITIN, TIBC, IRON, RETICCTPCT in the last 72 hours. Urine analysis:    Component Value Date/Time   COLORURINE YELLOW 02/27/2018 [place] 02/27/2018 1541   LABSPEC 1.025 02/27/2018 1541   PHURINE 6.0 02/27/2018 1541   GLUCOSEU NEGATIVE 02/27/2018 1541   HGBUR MODERATE (A) 02/27/2018 1541   BILIRUBINUR NEGATIVE 02/27/2018 1541   KETONESUR NEGATIVE 02/27/2018 1541    PROTEINUR NEGATIVE 02/27/2018 1541   UROBILINOGEN 1.0 10/31/2014 1646   NITRITE NEGATIVE 02/27/2018 1541   LEUKOCYTESUR NEGATIVE 02/27/2018 1541    Radiological Exams on Admission: Dg Abdomen Acute W/chest  Result Date: 07/16/2018 CLINICAL DATA:  Dark stools with abdominal pain EXAM: DG ABDOMEN ACUTE W/ 1V CHEST COMPARISON:  02/27/2018 FINDINGS: Cardiac shadows within normal limits. The lungs are well aerated bilaterally. No focal infiltrate or sizable effusion is seen. Scattered large and small bowel gas is noted. Mild retained fecal material is noted primarily within the right colon. No obstructive changes are seen. No free air is noted. Degenerative changes of the lumbar spine are seen. IMPRESSION: Mild retained fecal material.  No acute abnormality noted. Electronically Signed   By: Inez Catalina M.D.   On: 07/16/2018 18:58  EKG: Independently reviewed.  Assessment/Plan Principal Problem:   Gastrointestinal hemorrhage with melena Active Problems:   HTN (hypertension)    1. Melena - 1. Clear liquid diet, then NPO after MN 2. GI to see in AM for possible EGD 3. PPI GTT 4. Holding ASA / plavix 5. Repeat CBC / BMP in AM 6. Tele monitor 2. HTN - 1. Holding home BP meds for the moment  DVT prophylaxis: SCDs Code Status: Full Family Communication: No family in room Disposition Plan: Home after admit Consults called: Dr. Alessandra Bevels Admission status: Place in [place], [facility] Pager [phone] Only works nights!  If 7AM-7PM, please contact the primary day team physician taking care of patient  [email or web address] Password [email or web address]  07/16/2018, 8:11 PM

## 2018-07-16 NOTE — ED Triage Notes (Signed)
Pt reports dark red, tarry stool with blood clots onset today. Upper abd pain/bloating. Reports 5 stools today. Pt is on plavix.

## 2018-07-16 NOTE — ED Notes (Signed)
Pt with episode of blood mixed with her stool. Appeared to be mostly blood with some clots. EDP aware.

## 2018-07-17 ENCOUNTER — Encounter (HOSPITAL_COMMUNITY): Payer: Self-pay | Admitting: *Deleted

## 2018-07-17 ENCOUNTER — Encounter (HOSPITAL_COMMUNITY)
Admission: EM | Disposition: A | Discharge status: Home / Self Care | Payer: Self-pay | Source: Home / Self Care | Attending: Emergency Medicine

## 2018-07-17 ENCOUNTER — Other Ambulatory Visit: Payer: Self-pay

## 2018-07-17 ENCOUNTER — Observation Stay (HOSPITAL_COMMUNITY): Payer: Medicare Other | Admitting: Anesthesiology

## 2018-07-17 DIAGNOSIS — D62 Acute posthemorrhagic anemia: Secondary | ICD-10-CM | POA: Diagnosis present

## 2018-07-17 DIAGNOSIS — K640 First degree hemorrhoids: Secondary | ICD-10-CM | POA: Diagnosis not present

## 2018-07-17 DIAGNOSIS — K5730 Diverticulosis of large intestine without perforation or abscess without bleeding: Secondary | ICD-10-CM | POA: Diagnosis not present

## 2018-07-17 DIAGNOSIS — D649 Anemia, unspecified: Secondary | ICD-10-CM | POA: Insufficient documentation

## 2018-07-17 DIAGNOSIS — K449 Diaphragmatic hernia without obstruction or gangrene: Secondary | ICD-10-CM | POA: Diagnosis not present

## 2018-07-17 DIAGNOSIS — K921 Melena: Secondary | ICD-10-CM | POA: Diagnosis not present

## 2018-07-17 DIAGNOSIS — E876 Hypokalemia: Secondary | ICD-10-CM

## 2018-07-17 DIAGNOSIS — I10 Essential (primary) hypertension: Secondary | ICD-10-CM | POA: Diagnosis not present

## 2018-07-17 HISTORY — DX: Hypokalemia: E87.6

## 2018-07-17 HISTORY — PX: ESOPHAGOGASTRODUODENOSCOPY (EGD) WITH PROPOFOL: SHX5813

## 2018-07-17 HISTORY — DX: Anemia, unspecified: D64.9

## 2018-07-17 HISTORY — DX: Acute posthemorrhagic anemia: D62

## 2018-07-17 LAB — CBC
HCT: 33.3 % — ABNORMAL LOW (ref 36.0–46.0)
HCT: 31.4 % — ABNORMAL LOW (ref 36.0–46.0)
Hemoglobin: 10.6 g/dL — ABNORMAL LOW (ref 12.0–15.0)
Hemoglobin: 10 g/dL — ABNORMAL LOW (ref 12.0–15.0)
MCH: 29.4 pg (ref 26.0–34.0)
MCH: 29.2 pg (ref 26.0–34.0)
MCHC: 31.8 g/dL (ref 30.0–36.0)
MCHC: 31.8 g/dL (ref 30.0–36.0)
MCV: 92.2 fL (ref 80.0–100.0)
MCV: 91.5 fL (ref 80.0–100.0)
Platelets: 179 10*3/uL (ref 150–400)
Platelets: 166 10*3/uL (ref 150–400)
RBC: 3.61 MIL/uL — ABNORMAL LOW (ref 3.87–5.11)
RBC: 3.43 MIL/uL — ABNORMAL LOW (ref 3.87–5.11)
RDW: 13.1 % (ref 11.5–15.5)
RDW: 13.3 % (ref 11.5–15.5)
WBC: 4.7 10*3/uL (ref 4.0–10.5)
WBC: 4.1 10*3/uL (ref 4.0–10.5)
nRBC: 0 % (ref 0.0–0.2)
nRBC: 0 % (ref 0.0–0.2)

## 2018-07-17 LAB — BASIC METABOLIC PANEL
Anion gap: 9 (ref 5–15)
BUN: 17 mg/dL (ref 8–23)
CO2: 25 mmol/L (ref 22–32)
Calcium: 8.4 mg/dL — ABNORMAL LOW (ref 8.9–10.3)
Chloride: 104 mmol/L (ref 98–111)
Creatinine, Ser: 0.64 mg/dL (ref 0.44–1.00)
GFR calc Af Amer: >60 mL/min (ref >60)
GFR calc non Af Amer: >60 mL/min (ref >60)
Glucose, Bld: 113 mg/dL — ABNORMAL HIGH (ref 70–99)
Potassium: 3.3 mmol/L — ABNORMAL LOW (ref 3.5–5.1)
Sodium: 138 mmol/L (ref 135–145)

## 2018-07-17 LAB — MAGNESIUM: Magnesium: 1.9 mg/dL (ref 1.7–2.4)

## 2018-07-17 LAB — HEMOGLOBIN AND HEMATOCRIT, BLOOD
HCT: 33.1 % — ABNORMAL LOW (ref 36.0–46.0)
Hemoglobin: 10.3 g/dL — ABNORMAL LOW (ref 12.0–15.0)

## 2018-07-17 SURGERY — ESOPHAGOGASTRODUODENOSCOPY (EGD) WITH PROPOFOL
Anesthesia: Monitor Anesthesia Care

## 2018-07-17 MED ORDER — PROPOFOL 10 MG/ML IV BOLUS
INTRAVENOUS | Status: DC | PRN
  Administered 2018-07-17 (×2): 30 mg via INTRAVENOUS

## 2018-07-17 MED ORDER — POTASSIUM CHLORIDE 10 MEQ/100ML IV SOLN
10.0000 meq | INTRAVENOUS | Status: DC
  Filled 2018-07-17: qty 100

## 2018-07-17 MED ORDER — POTASSIUM CHLORIDE CRYS ER 20 MEQ PO TBCR
40.0000 meq | EXTENDED_RELEASE_TABLET | Freq: Once | ORAL | Status: AC
  Administered 2018-07-17: 40 meq via ORAL
  Filled 2018-07-17: qty 2

## 2018-07-17 MED ORDER — PROPOFOL 500 MG/50ML IV EMUL
INTRAVENOUS | Status: DC | PRN
  Administered 2018-07-17: 75 ug/kg/min via INTRAVENOUS

## 2018-07-17 MED ORDER — SODIUM CHLORIDE 0.9 % IV SOLN
INTRAVENOUS | Status: DC
  Administered 2018-07-17 (×2): via INTRAVENOUS

## 2018-07-17 MED ORDER — SODIUM CHLORIDE 0.9 % IV SOLN: INTRAVENOUS | Status: DC

## 2018-07-17 MED ORDER — SODIUM CHLORIDE 0.9 % IV BOLUS: 1000.0000 mL | Freq: Once | INTRAVENOUS | Status: DC

## 2018-07-17 MED ORDER — PEG 3350-KCL-NA BICARB-NACL 420 G PO SOLR: 4000.0000 mL | Freq: Once | ORAL | Status: DC

## 2018-07-17 MED ORDER — LACTATED RINGERS IV SOLN
INTRAVENOUS | Status: DC | PRN
  Administered 2018-07-17: 14:00:00 via INTRAVENOUS

## 2018-07-17 MED ORDER — PEG 3350-KCL-NA BICARB-NACL 420 G PO SOLR
4000.0000 mL | Freq: Once | ORAL | Status: AC
  Administered 2018-07-17: 4000 mL via ORAL
  Filled 2018-07-17: qty 4000

## 2018-07-17 SURGICAL SUPPLY — 15 items

## 2018-07-17 NOTE — Progress Notes (Signed)
I discussed EGD with patient over the phone and she is set up for EGD at 2:00 with propofol sedation.  She is aware of the procedure and I will physically see her prior to the procedure.

## 2018-07-17 NOTE — Interval H&P Note (Signed)
History and Physical Interval Note:  07/17/2018 1:43 PM  [name]  has presented today for surgery, with the diagnosis of GIbleed  The various methods of treatment have been discussed with the patient and family. After consideration of risks, benefits and other options for treatment, the patient has consented to  Procedure(s): ESOPHAGOGASTRODUODENOSCOPY (EGD) WITH PROPOFOL (N/A) as a surgical intervention .  The patient's history has been reviewed, patient examined, no change in status, stable for surgery.  I have reviewed the patient's chart and labs.  Questions were answered to the patient's satisfaction.     Nancy Fetter

## 2018-07-17 NOTE — Anesthesia Postprocedure Evaluation (Signed)
Anesthesia Post Note  Patient: Dorothye Berni  Procedure(s) Performed: ESOPHAGOGASTRODUODENOSCOPY (EGD) WITH PROPOFOL (N/A )     Patient location during evaluation: PACU Anesthesia Type: MAC Level of consciousness: awake and alert Pain management: pain level controlled Vital Signs Assessment: post-procedure vital signs reviewed and stable Respiratory status: spontaneous breathing, nonlabored ventilation, respiratory function stable and patient connected to nasal cannula oxygen Cardiovascular status: stable and blood pressure returned to baseline Postop Assessment: no apparent nausea or vomiting Anesthetic complications: no    Last Vitals:  Vitals:   07/17/18 1334 07/17/18 1415  BP: (!) 159/60 (!) 116/52  Pulse: 62 63  Resp: (!) 21 14  Temp: 37.1 C 37.1 C  SpO2: (!) 64% 97%    Last Pain:  Vitals:   07/17/18 1415  TempSrc: Oral  PainSc: 0-No pain                 Jiraiya Mcewan S

## 2018-07-17 NOTE — Consult Note (Signed)
[facility] Reason for consult: GI bleeding Referring Physician: [facility] hospitalist.  PCP: Dr. Justin Mend.  Primary GI: None has previously received her health care in [place] is an 68 y.o. female.  HPI: She is followed by [facility] by Dr. 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  She is never had an upper GI endoscopy but has had multiple prior colonoscopies while living in [place] at least 2.  Her last one was about 3 to 4 years ago and she reports that nothing of significance was found.  She denies taking any NSAIDs other than as above.  She had several episodes of black tarry stools and some vague right upper quadrant pain which she had attributed to bumping her abdomen into the corner of a table.  Past Medical History:  Diagnosis Date  . Cervical disc disease    MRI 2016  . Hepatic steatosis    per imaging 2016, 2019  . History of palpitations    Holter monitor, 2017: NSR, occasional PVC, PACs, no arrhythmia  . Hypercholesteremia   . Hypertension   . Lumbar disc disease    MRI, 2017     Past Surgical History:  Procedure Laterality Date  . BLADDER REPAIR    . BREAST EXCISIONAL BIOPSY Left   . CARPAL TUNNEL RELEASE Right   . CATARACT EXTRACTION, BILATERAL  2014  . REPAIR RECTOCELE      Family History  Problem Relation Age of Onset  . CAD Mother   . Breast cancer Mother 39    Social History:  reports that she has never smoked. She has never used smokeless tobacco. She reports that she does not drink alcohol or use drugs.  Allergies:  Allergies  Allergen Reactions  . Ciprofloxacin     PAIN IN HANDS   . Codeine Nausea And Vomiting    Medications; Prior to Admission medications   Medication Sig Start Date End Date Taking? Authorizing  Provider  acetaminophen (TYLENOL) 500 MG tablet Take 1,000 mg by mouth every 6 (six) hours as needed for mild pain, moderate pain, fever or headache.   Yes [provider]  aspirin EC 81 MG tablet Take 1 tablet (81 mg total) by mouth daily. 02/28/18 02/28/19 Yes Lady Deutscher, MD  cholecalciferol (VITAMIN D) 1000 UNITS tablet Take 1,000 Units by mouth daily.   Yes [provider]  clopidogrel (PLAVIX) 75 MG tablet Take 1 tablet (75 mg total) by mouth daily. 02/28/18 02/28/19 Yes Lady Deutscher, MD  Coenzyme Q10 (CO Q 10 PO) Take 1 tablet by mouth daily.   Yes [provider]  Cyanocobalamin (VITAMIN B 12 PO) Take 1 tablet by mouth daily.   Yes [provider]  IRON PO Take 1 tablet by mouth every other day.   Yes [provider]  lisinopril-hydrochlorothiazide (PRINZIDE,ZESTORETIC) 20-25 MG per tablet Take 1 tablet by mouth daily.   Yes [provider]  Melatonin 5 MG CHEW Chew 5 mg by mouth at bedtime.   Yes [provider]  Multiple Vitamins-Minerals (MULTIVITAMIN WITH MINERALS) tablet Take 1 tablet by mouth daily.   Yes [provider]  rosuvastatin (CRESTOR) 10 MG tablet Take 1 tablet (10 mg total) by mouth daily. 03/03/18 07/16/18 Yes Revankar, [name], MD  nitroGLYCERIN (NITROSTAT) 0.4 MG  SL tablet Place 1 tablet (0.4 mg total) under the tongue every 5 (five) minutes as needed. Patient not taking: Reported on 07/16/2018 02/28/18   Leanor Kail, PA   . Doug Sou Hold] potassium chloride  40 mEq Oral Once   PRN Meds [MAR Hold] acetaminophen **OR** [MAR Hold] acetaminophen, [MAR Hold] ondansetron **OR** [MAR Hold] ondansetron (ZOFRAN) IV Results for orders placed or performed during the hospital encounter of 07/16/18 (from the past 48 hour(s))  Comprehensive metabolic panel     Status: Abnormal   Collection Time: 07/16/18  5:48 PM  Result Value Ref Range   Sodium 140 135 - 145 mmol/L   Potassium 3.5 3.5 - 5.1 mmol/L    Chloride 100 98 - 111 mmol/L   CO2 28 22 - 32 mmol/L   Glucose, Bld 108 (H) 70 - 99 mg/dL   BUN 16 8 - 23 mg/dL   Creatinine, Ser 0.74 0.44 - 1.00 mg/dL   Calcium 9.0 8.9 - 10.3 mg/dL   Total Protein 6.8 6.5 - 8.1 g/dL   Albumin 4.0 3.5 - 5.0 g/dL   AST 28 15 - 41 U/L   ALT 22 0 - 44 U/L   Alkaline Phosphatase 45 38 - 126 U/L   Total Bilirubin 0.6 0.3 - 1.2 mg/dL   GFR calc non Af Amer >60 >60 mL/min   GFR calc Af Amer >60 >60 mL/min   Anion gap 12 5 - 15    Comment: Performed at [facility], [address], [place], [place] [address]  Protime-INR     Status: None   Collection Time: 07/16/18  5:48 PM  Result Value Ref Range   Prothrombin Time 13.3 11.4 - 15.2 seconds   INR 1.02     Comment: Performed at [facility] [address] [place], [place] [address]  Type and screen [place]     Status: None   Collection Time: 07/16/18  5:48 PM  Result Value Ref Range   ABO/RH(D) B NEG    Antibody Screen NEG    Sample Expiration      07/19/2018 Performed at [facility], [place] [address] [place], [place] [address]   CBC with Differential     Status: None   Collection Time: 07/16/18  5:48 PM  Result Value Ref Range   WBC 5.4 4.0 - 10.5 K/uL   RBC 4.38 3.87 - 5.11 MIL/uL   Hemoglobin 12.7 12.0 - 15.0 g/dL   HCT 40.7 36.0 - 46.0 %   MCV 92.9 80.0 - 100.0 fL   MCH 29.0 26.0 - 34.0 pg   MCHC 31.2 30.0 - 36.0 g/dL   RDW 13.2 11.5 - 15.5 %   Platelets 195 150 - 400 K/uL   nRBC 0.0 0.0 - 0.2 %   Neutrophils Relative % 59 %   Neutro Abs 3.2 1.7 - 7.7 K/uL   Lymphocytes Relative 26 %   Lymphs Abs 1.4 0.7 - 4.0 K/uL   Monocytes Relative 10 %   Monocytes Absolute 0.5 0.1 - 1.0 K/uL   Eosinophils Relative 4 %   Eosinophils Absolute 0.2 0.0 - 0.5 K/uL   Basophils Relative 1 %   Basophils Absolute 0.1 0.0 - 0.1 K/uL   Immature Granulocytes 0 %   Abs Immature Granulocytes 0.02 0.00 - 0.07 K/uL    Comment: Performed at [facility],  [address], [place], [place] [address]  ABO/Rh     Status: None  Collection Time: 07/16/18  5:48 PM  Result Value Ref Range   ABO/RH(D)      B NEG Performed at [facility] [address] [place], [place] [address]   CBC     Status: Abnormal   Collection Time: 07/17/18  1:49 AM  Result Value Ref Range   WBC 4.7 4.0 - 10.5 K/uL   RBC 3.61 (L) 3.87 - 5.11 MIL/uL   Hemoglobin 10.6 (L) 12.0 - 15.0 g/dL   HCT 33.3 (L) 36.0 - 46.0 %   MCV 92.2 80.0 - 100.0 fL   MCH 29.4 26.0 - 34.0 pg   MCHC 31.8 30.0 - 36.0 g/dL   RDW 13.1 11.5 - 15.5 %   Platelets 179 150 - 400 K/uL   nRBC 0.0 0.0 - 0.2 %    Comment: Performed at [facility], [place] [address] [place], [place] [address]  Basic metabolic panel     Status: Abnormal   Collection Time: 07/17/18  7:17 AM  Result Value Ref Range   Sodium 138 135 - 145 mmol/L   Potassium 3.3 (L) 3.5 - 5.1 mmol/L   Chloride 104 98 - 111 mmol/L   CO2 25 22 - 32 mmol/L   Glucose, Bld 113 (H) 70 - 99 mg/dL   BUN 17 8 - 23 mg/dL   Creatinine, Ser 0.64 0.44 - 1.00 mg/dL   Calcium 8.4 (L) 8.9 - 10.3 mg/dL   GFR calc non Af Amer >60 >60 mL/min   GFR calc Af Amer >60 >60 mL/min   Anion gap 9 5 - 15    Comment: Performed at [facility] [address], [place], [place] [address]  CBC     Status: Abnormal   Collection Time: 07/17/18  7:17 AM  Result Value Ref Range   WBC 4.1 4.0 - 10.5 K/uL   RBC 3.43 (L) 3.87 - 5.11 MIL/uL   Hemoglobin 10.0 (L) 12.0 - 15.0 g/dL   HCT 31.4 (L) 36.0 - 46.0 %   MCV 91.5 80.0 - 100.0 fL   MCH 29.2 26.0 - 34.0 pg   MCHC 31.8 30.0 - 36.0 g/dL   RDW 13.3 11.5 - 15.5 %   Platelets 166 150 - 400 K/uL   nRBC 0.0 0.0 - 0.2 %    Comment: Performed at [facility], [place] [address] [place], [place] [address]    Dg Abdomen Acute W/chest  Result Date: 07/16/2018 CLINICAL DATA:  Dark stools with abdominal pain EXAM: DG ABDOMEN ACUTE W/ 1V CHEST COMPARISON:  02/27/2018 FINDINGS: Cardiac shadows within normal  limits. The lungs are well aerated bilaterally. No focal infiltrate or sizable effusion is seen. Scattered large and small bowel gas is noted. Mild retained fecal material is noted primarily within the right colon. No obstructive changes are seen. No free air is noted. Degenerative changes of the lumbar spine are seen. IMPRESSION: Mild retained fecal material.  No acute abnormality noted. Electronically Signed   By: Inez Catalina M.D.   On: 07/16/2018 18:58               Blood pressure (!) 159/60, pulse 62, temperature 98.8 F (37.1 C), temperature source Oral, resp. rate (!) 21, height 5\' 6"  (1.676 m), weight 118.6 kg, SpO2 (!) 64 %.  Physical exam:   General--morbidly obese white female no acute distress ENT--thick neck but no oral lesions Neck--thick neck with no palpable masses Heart--regular rate and rhythm without murmurs or gallops  Lungs--clear Abdomen--obese soft and nontender Psych--alert oriented answers questions appropriately   Assessment: 1.  GI bleed.  She has had previous colonoscopies in [facility] and reports no significant findings last several years ago.  This likely is upper GI bleed 2.  Obesity 3.  Hypertension 4.  On antiplatelet agents due to cardiac abnormality   Plan: We will proceed at this time with EGD.  Discussed procedure with patient.  If this is negative she will likely need colonoscopy as well.   Nancy Fetter 07/17/2018, 1:36 PM   This note was created using voice recognition software and minor errors may Have occurred unintentionally. Pager: [phone] If no answer or after hours call [phone]

## 2018-07-17 NOTE — Transfer of Care (Signed)
Immediate Anesthesia Transfer of Care Note  Patient: Brittany Middleton  Procedure(s) Performed: ESOPHAGOGASTRODUODENOSCOPY (EGD) WITH PROPOFOL (N/A )  Patient Location: PACU and Endoscopy Unit  Anesthesia Type:MAC  Level of Consciousness: drowsy and patient cooperative  Airway & Oxygen Therapy: Patient Spontanous Breathing and Patient connected to nasal cannula oxygen  Post-op Assessment: Report given to RN and Post -op Vital signs reviewed and stable  Post vital signs: Reviewed and stable  Last Vitals:  Vitals Value Taken Time  BP    Temp    Pulse    Resp    SpO2      Last Pain:  Vitals:   07/17/18 1334  TempSrc: Oral  PainSc: 0-No pain         Complications: No apparent anesthesia complications

## 2018-07-17 NOTE — Op Note (Signed)
[facility] Patient Name: Brittany Middleton Procedure Date : 07/17/2018 MRN: [id number] Attending MD: Nancy Fetter Dr., MD Date of Birth: 1950-12-18 CSN: [id number] Age: 68 Admit Type: Inpatient Procedure:                Upper GI endoscopy Indications:              Melena Providers:                Jeneen Rinks L. Randye Treichler Dr., MD, Burtis Junes, RN, Cletis [place], Technician Referring MD:              Medicines:                Monitored Anesthesia Care Complications:            No immediate complications. Estimated Blood Loss:     Estimated blood loss: none. Procedure:                Pre-Anesthesia Assessment:                           - Prior to the procedure, a History and Physical                            was performed, and patient medications and                            allergies were reviewed. The patient's tolerance of                            previous anesthesia was also reviewed. The risks                            and benefits of the procedure and the sedation                            options and risks were discussed with the patient.                            All questions were answered, and informed consent                            was obtained. Prior Anticoagulants: The patient has                            taken Plavix (clopidogrel), last dose was 1 day                            prior to procedure. ASA Grade Assessment: III - A                            patient with severe systemic disease. After  reviewing the risks and benefits, the patient was                            deemed in satisfactory condition to undergo the                            procedure.                           After obtaining informed consent, the endoscope was                            passed under direct vision. Throughout the                            procedure, the patient's blood pressure, pulse, and        oxygen saturations were monitored continuously. The                            GIF-H190 ([id number]) Olympus gastroscope was                            introduced through the mouth, and advanced to the                            second part of duodenum. The upper GI endoscopy was                            accomplished without difficulty. The patient                            tolerated the procedure well. Scope In: Scope Out: Findings:      A medium-sized hiatal hernia was present.      The stomach was normal.      The examined duodenum was normal. Impression:               - Medium-sized hiatal hernia.                           - Normal stomach.                           - Normal examined duodenum.                           - No specimens collected.                           - Blood in stool without cause found on endoscopic                            exam Recommendation:           - Clear liquid diet.                           - Perform a colonoscopy  at appointment to be                            scheduled. Procedure Code(s):        --- Professional ---                           [phone], Esophagogastroduodenoscopy, flexible,                            transoral; diagnostic, including collection of                            specimen(s) by brushing or washing, when performed                            (separate procedure) Diagnosis Code(s):        --- Professional ---                           K92.1, Melena (includes Hematochezia)                           K44.9, Diaphragmatic hernia without obstruction or                            gangrene CPT copyright 2018 [employer]. All rights reserved. The codes documented in this report are preliminary and upon coder review may  be revised to meet current compliance requirements. Nancy Fetter Dr., MD 07/17/2018 2:23:27 PM This report has been signed electronically. Number of Addenda: 0

## 2018-07-17 NOTE — Progress Notes (Signed)
[facility] PROGRESS NOTE  Brittany Middleton DGL:[id number] DOB: 14-Jun-1951 DOA: 07/16/2018 PCP: Maurice Small, MD  Assessment/Plan:  #1. GI beed. Pt has had 9 episodes bldy stool with clots progressing to BRBPR since presentation. None since 5am. Home meds include plavix. No nsaid use. Last colonoscoy was "fine".  -continue ppi gtt -npo -egd for this afternoon per GI -hold plavix and asa  #2. Acute blood loss anemia. Hg dropped from 12 to 10. Seems to be stable at 10. No further BMs. -serial cbc -transfuse as indicated  #3, HTN. Controlled. Home meds include lisinopril and HCTZ -hold home meds for now -prn hydralazine   #4. Hypokalemia. Likely related to gi losses in setting of HCTZ. Potassium 3.3 -replete -check mag level -recheck in am   Code Status: full Family Communication: none present Disposition Plan: home hopefully tomorrow   Consultants:  [facility]  Procedures:  EGD  Antibiotics:    HPI/Subjective: Patient 68 yo hx htn, obesity presents with BRBPR and melena as well as abdominal pain. Continued with 9 more episodes. Stable this am  Denies pain/discomfort. Awaiting EGD  Objective: Vitals:   07/17/18 0246 07/17/18 0726  BP: 132/69 124/62  Pulse: 66 (!) 55  Resp: 18 15  Temp: 98.1 F (36.7 C) 97.9 F (36.6 C)  SpO2: 99% 92%    Intake/Output Summary (Last 24 hours) at 07/17/2018 1019 Last data filed at 07/17/2018 0600 Gross per 24 hour  Intake 250 ml  Output 700 ml  Net -450 ml   Filed Weights   07/17/18 0348  Weight: 118.6 kg    Exam:   General:  Awake alert in no acute distress  Cardiovascular: rrr no mgr no LE edema  Respiratory: normal effort BS clear bilaterally   Abdomen: obese soft +BS but sluggish. Non-tender to palpation. No guarding or rebounding  Musculoskeletal: joints without swelling/erythema   Data Reviewed: Basic Metabolic Panel: Recent Labs  Lab 07/16/18 1748 07/17/18 0717  NA 140 138  K 3.5 3.3*   CL 100 104  CO2 28 25  GLUCOSE 108* 113*  BUN 16 17  CREATININE 0.74 0.64  CALCIUM 9.0 8.4*   Liver Function Tests: Recent Labs  Lab 07/16/18 1748  AST 28  ALT 22  ALKPHOS 45  BILITOT 0.6  PROT 6.8  ALBUMIN 4.0   No results for input(s): LIPASE, AMYLASE in the last 168 hours. No results for input(s): AMMONIA in the last 168 hours. CBC: Recent Labs  Lab 07/16/18 1748 07/17/18 0149 07/17/18 0717  WBC 5.4 4.7 4.1  NEUTROABS 3.2  --   --   HGB 12.7 10.6* 10.0*  HCT 40.7 33.3* 31.4*  MCV 92.9 92.2 91.5  PLT 195 179 166   Cardiac Enzymes: No results for input(s): CKTOTAL, CKMB, CKMBINDEX, TROPONINI in the last 168 hours. BNP (last 3 results) No results for input(s): BNP in the last 8760 hours.  ProBNP (last 3 results) No results for input(s): PROBNP in the last 8760 hours.  CBG: No results for input(s): GLUCAP in the last 168 hours.  No results found for this or any previous visit (from the past 240 hour(s)).   Studies: Dg Abdomen Acute W/chest  Result Date: 07/16/2018 CLINICAL DATA:  Dark stools with abdominal pain EXAM: DG ABDOMEN ACUTE W/ 1V CHEST COMPARISON:  02/27/2018 FINDINGS: Cardiac shadows within normal limits. The lungs are well aerated bilaterally. No focal infiltrate or sizable effusion is seen. Scattered large and small bowel gas is noted. Mild retained fecal material is  noted primarily within the right colon. No obstructive changes are seen. No free air is noted. Degenerative changes of the lumbar spine are seen. IMPRESSION: Mild retained fecal material.  No acute abnormality noted. Electronically Signed   By: Inez Catalina M.D.   On: 07/16/2018 18:58    Scheduled Meds: Continuous Infusions: . sodium chloride 75 mL/hr at 07/17/18 0659  . pantoprozole (PROTONIX) infusion 8 mg/hr (07/16/18 2115)    Principal Problem:   Gastrointestinal hemorrhage with melena Active Problems:   Acute blood loss anemia   HTN (hypertension)   Hypokalemia    Time  spent: [place] NP  [facility] If 7PM-7AM, please contact night-coverage at [email or web address], password [facility] 07/17/2018, 10:19 AM  LOS: 0 days

## 2018-07-17 NOTE — ED Notes (Signed)
Pt has had two loose and bloody stools consisting mostly of blood in the past two hours. She reports having 8 total BMs similar to this since 1400.

## 2018-07-17 NOTE — Care Management Obs Status (Signed)
[facility] NOTIFICATION   Patient Details  Name: Brittany Middleton MRN: [id number] Date of Birth: 1951/01/02   Medicare Observation Status Notification Given:  Yes    Zenon Mayo, RN 07/17/2018, 12:17 PM

## 2018-07-17 NOTE — Anesthesia Preprocedure Evaluation (Addendum)
Anesthesia Evaluation  Patient identified by MRN, date of birth, ID band Patient awake    Reviewed: Allergy & Precautions, NPO status , Patient's Chart, lab work & pertinent test results  Airway Mallampati: III  TM Distance: >3 FB Neck ROM: Full    Dental  (+) Teeth Intact, Dental Advisory Given   Pulmonary sleep apnea and Continuous Positive Airway Pressure Ventilation ,    Pulmonary exam normal breath sounds clear to auscultation       Cardiovascular hypertension, Pt. on medications + CAD  Normal cardiovascular exam Rhythm:Regular Rate:Normal     Neuro/Psych negative neurological ROS     GI/Hepatic negative GI ROS, Neg liver ROS, GI bleed    Endo/Other  Morbid obesity  Renal/GU negative Renal ROS     Musculoskeletal negative musculoskeletal ROS (+)   Abdominal   Peds  Hematology  (+) Blood dyscrasia (Plavix), anemia ,   Anesthesia Other Findings Day of surgery medications reviewed with the patient.  Reproductive/Obstetrics                            Anesthesia Physical Anesthesia Plan  ASA: III  Anesthesia Plan: MAC   Post-op Pain Management:    Induction: Intravenous  PONV Risk Score and Plan: 2 and Treatment may vary due to age or medical condition and Propofol infusion  Airway Management Planned: Nasal Cannula and Natural Airway  Additional Equipment:   Intra-op Plan:   Post-operative Plan:   Informed Consent: I have reviewed the patients History and Physical, chart, labs and discussed the procedure including the risks, benefits and alternatives for the proposed anesthesia with the patient or authorized representative who has indicated his/her understanding and acceptance.     Dental advisory given  Plan Discussed with: CRNA and Anesthesiologist  Anesthesia Plan Comments:        Anesthesia Quick Evaluation

## 2018-07-17 NOTE — H&P (View-Only) (Signed)
[facility] CON[facility] Reason for consult: GI bleeding Referring Physician: [facility].  PCP: Dr. Justin Mend.  Primary GI: None has previously received her health care in [place] is an 68 y.o. female.  HPI: She is followed by [facility] by Dr. 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  She is never had an upper GI endoscopy but has had multiple prior colonoscopies while living in [place] at least 2.  Her last one was about 3 to 4 years ago and she reports that nothing of significance was found.  She denies taking any NSAIDs other than as above.  She had several episodes of black tarry stools and some vague right upper quadrant pain which she had attributed to bumping her abdomen into the corner of a table.  Past Medical History:  Diagnosis Date  . Cervical disc disease    MRI 2016  . Hepatic steatosis    per imaging 2016, 2019  . History of palpitations    Holter monitor, 2017: NSR, occasional PVC, PACs, no arrhythmia  . Hypercholesteremia   . Hypertension   . Lumbar disc disease    MRI, 2017     Past Surgical History:  Procedure Laterality Date  . BLADDER REPAIR    . BREAST EXCISIONAL BIOPSY Left   . CARPAL TUNNEL RELEASE Right   . CATARACT EXTRACTION, BILATERAL  2014  . REPAIR RECTOCELE      Family History  Problem Relation Age of Onset  . CAD Mother   . Breast cancer Mother 40    Social History:  reports that she has never smoked. She has never used smokeless tobacco. She reports that she does not drink alcohol or use drugs.  Allergies:  Allergies  Allergen Reactions  . Ciprofloxacin     PAIN IN HANDS   . Codeine Nausea And Vomiting    Medications; Prior to Admission medications   Medication Sig Start Date End Date Taking? Authorizing  Provider  acetaminophen (TYLENOL) 500 MG tablet Take 1,000 mg by mouth every 6 (six) hours as needed for mild pain, moderate pain, fever or headache.   Yes [provider]  aspirin EC 81 MG tablet Take 1 tablet (81 mg total) by mouth daily. 02/28/18 02/28/19 Yes Lady Deutscher, MD  cholecalciferol (VITAMIN D) 1000 UNITS tablet Take 1,000 Units by mouth daily.   Yes [provider]  clopidogrel (PLAVIX) 75 MG tablet Take 1 tablet (75 mg total) by mouth daily. 02/28/18 02/28/19 Yes Lady Deutscher, MD  Coenzyme Q10 (CO Q 10 PO) Take 1 tablet by mouth daily.   Yes [provider]  Cyanocobalamin (VITAMIN B 12 PO) Take 1 tablet by mouth daily.   Yes [provider]  IRON PO Take 1 tablet by mouth every other day.   Yes [provider]  lisinopril-hydrochlorothiazide (PRINZIDE,ZESTORETIC) 20-25 MG per tablet Take 1 tablet by mouth daily.   Yes [provider]  Melatonin 5 MG CHEW Chew 5 mg by mouth at bedtime.   Yes [provider]  Multiple Vitamins-Minerals (MULTIVITAMIN WITH MINERALS) tablet Take 1 tablet by mouth daily.   Yes [provider]  rosuvastatin (CRESTOR) 10 MG tablet Take 1 tablet (10 mg total) by mouth daily. 03/03/18 07/16/18 Yes Revankar, [name], MD  nitroGLYCERIN (NITROSTAT) 0.4 MG  SL tablet Place 1 tablet (0.4 mg total) under the tongue every 5 (five) minutes as needed. Patient not taking: Reported on 07/16/2018 02/28/18   Leanor Kail, PA   . Doug Sou Hold] potassium chloride  40 mEq Oral Once   PRN Meds [MAR Hold] acetaminophen **OR** [MAR Hold] acetaminophen, [MAR Hold] ondansetron **OR** [MAR Hold] ondansetron (ZOFRAN) IV Results for orders placed or performed during the hospital encounter of 07/16/18 (from the past 48 hour(s))  Comprehensive metabolic panel     Status: Abnormal   Collection Time: 07/16/18  5:48 PM  Result Value Ref Range   Sodium 140 135 - 145 mmol/L   Potassium 3.5 3.5 - 5.1 mmol/L    Chloride 100 98 - 111 mmol/L   CO2 28 22 - 32 mmol/L   Glucose, Bld 108 (H) 70 - 99 mg/dL   BUN 16 8 - 23 mg/dL   Creatinine, Ser 0.74 0.44 - 1.00 mg/dL   Calcium 9.0 8.9 - 10.3 mg/dL   Total Protein 6.8 6.5 - 8.1 g/dL   Albumin 4.0 3.5 - 5.0 g/dL   AST 28 15 - 41 U/L   ALT 22 0 - 44 U/L   Alkaline Phosphatase 45 38 - 126 U/L   Total Bilirubin 0.6 0.3 - 1.2 mg/dL   GFR calc non Af Amer >60 >60 mL/min   GFR calc Af Amer >60 >60 mL/min   Anion gap 12 5 - 15    Comment: Performed at [facility], [address] [place] [address] [place], [place] [address]  Protime-INR     Status: None   Collection Time: 07/16/18  5:48 PM  Result Value Ref Range   Prothrombin Time 13.3 11.4 - 15.2 seconds   INR 1.02     Comment: Performed at [facility] [address], [place], [place] [address]  Type and screen [place]     Status: None   Collection Time: 07/16/18  5:48 PM  Result Value Ref Range   ABO/RH(D) B NEG    Antibody Screen NEG    Sample Expiration      07/19/2018 Performed at [facility], [place] [address], [place], [place] [address]   CBC with Differential     Status: None   Collection Time: 07/16/18  5:48 PM  Result Value Ref Range   WBC 5.4 4.0 - 10.5 K/uL   RBC 4.38 3.87 - 5.11 MIL/uL   Hemoglobin 12.7 12.0 - 15.0 g/dL   HCT 40.7 36.0 - 46.0 %   MCV 92.9 80.0 - 100.0 fL   MCH 29.0 26.0 - 34.0 pg   MCHC 31.2 30.0 - 36.0 g/dL   RDW 13.2 11.5 - 15.5 %   Platelets 195 150 - 400 K/uL   nRBC 0.0 0.0 - 0.2 %   Neutrophils Relative % 59 %   Neutro Abs 3.2 1.7 - 7.7 K/uL   Lymphocytes Relative 26 %   Lymphs Abs 1.4 0.7 - 4.0 K/uL   Monocytes Relative 10 %   Monocytes Absolute 0.5 0.1 - 1.0 K/uL   Eosinophils Relative 4 %   Eosinophils Absolute 0.2 0.0 - 0.5 K/uL   Basophils Relative 1 %   Basophils Absolute 0.1 0.0 - 0.1 K/uL   Immature Granulocytes 0 %   Abs Immature Granulocytes 0.02 0.00 - 0.07 K/uL    Comment: Performed at [facility],  [address] [place], [place] [address]  ABO/Rh     Status: None  Collection Time: 07/16/18  5:48 PM  Result Value Ref Range   ABO/RH(D)      B NEG Performed at [facility] [address], [place], [place] [address]   CBC     Status: Abnormal   Collection Time: 07/17/18  1:49 AM  Result Value Ref Range   WBC 4.7 4.0 - 10.5 K/uL   RBC 3.61 (L) 3.87 - 5.11 MIL/uL   Hemoglobin 10.6 (L) 12.0 - 15.0 g/dL   HCT 33.3 (L) 36.0 - 46.0 %   MCV 92.2 80.0 - 100.0 fL   MCH 29.4 26.0 - 34.0 pg   MCHC 31.8 30.0 - 36.0 g/dL   RDW 13.1 11.5 - 15.5 %   Platelets 179 150 - 400 K/uL   nRBC 0.0 0.0 - 0.2 %    Comment: Performed at [facility], [place] [address] [place], [place] [address]  Basic metabolic panel     Status: Abnormal   Collection Time: 07/17/18  7:17 AM  Result Value Ref Range   Sodium 138 135 - 145 mmol/L   Potassium 3.3 (L) 3.5 - 5.1 mmol/L   Chloride 104 98 - 111 mmol/L   CO2 25 22 - 32 mmol/L   Glucose, Bld 113 (H) 70 - 99 mg/dL   BUN 17 8 - 23 mg/dL   Creatinine, Ser 0.64 0.44 - 1.00 mg/dL   Calcium 8.4 (L) 8.9 - 10.3 mg/dL   GFR calc non Af Amer >60 >60 mL/min   GFR calc Af Amer >60 >60 mL/min   Anion gap 9 5 - 15    Comment: Performed at [facility] [address], [place], [place] [address]  CBC     Status: Abnormal   Collection Time: 07/17/18  7:17 AM  Result Value Ref Range   WBC 4.1 4.0 - 10.5 K/uL   RBC 3.43 (L) 3.87 - 5.11 MIL/uL   Hemoglobin 10.0 (L) 12.0 - 15.0 g/dL   HCT 31.4 (L) 36.0 - 46.0 %   MCV 91.5 80.0 - 100.0 fL   MCH 29.2 26.0 - 34.0 pg   MCHC 31.8 30.0 - 36.0 g/dL   RDW 13.3 11.5 - 15.5 %   Platelets 166 150 - 400 K/uL   nRBC 0.0 0.0 - 0.2 %    Comment: Performed at [facility], [place] [address] [place], [place] [address]    Dg Abdomen Acute W/chest  Result Date: 07/16/2018 CLINICAL DATA:  Dark stools with abdominal pain EXAM: DG ABDOMEN ACUTE W/ 1V CHEST COMPARISON:  02/27/2018 FINDINGS: Cardiac shadows within normal  limits. The lungs are well aerated bilaterally. No focal infiltrate or sizable effusion is seen. Scattered large and small bowel gas is noted. Mild retained fecal material is noted primarily within the right colon. No obstructive changes are seen. No free air is noted. Degenerative changes of the lumbar spine are seen. IMPRESSION: Mild retained fecal material.  No acute abnormality noted. Electronically Signed   By: Inez Catalina M.D.   On: 07/16/2018 18:58               Blood pressure (!) 159/60, pulse 62, temperature 98.8 F (37.1 C), temperature source Oral, resp. rate (!) 21, height 5\' 6"  (1.676 m), weight 118.6 kg, SpO2 (!) 64 %.  Physical exam:   General--morbidly obese white female no acute distress ENT--thick neck but no oral lesions Neck--thick neck with no palpable masses Heart--regular rate and rhythm without murmurs or gallops  Lungs--clear Abdomen--obese soft and nontender Psych--alert oriented answers questions appropriately   Assessment: 1.  GI bleed.  She has had previous colonoscopies in [facility] and reports no significant findings last several years ago.  This likely is upper GI bleed 2.  Obesity 3.  Hypertension 4.  On antiplatelet agents due to cardiac abnormality   Plan: We will proceed at this time with EGD.  Discussed procedure with patient.  If this is negative she will likely need colonoscopy as well.   Nancy Fetter 07/17/2018, 1:36 PM   This note was created using voice recognition software and minor errors may Have occurred unintentionally. Pager: [phone] If no answer or after hours call [phone]

## 2018-07-17 NOTE — Anesthesia Procedure Notes (Signed)
Procedure Name: MAC Date/Time: 07/17/2018 1:50 PM Performed by: Quante Pettry T, CRNA Pre-anesthesia Checklist: Patient identified, Emergency Drugs available, Suction available and Patient being monitored Patient Re-evaluated:Patient Re-evaluated prior to induction Oxygen Delivery Method: Nasal cannula Preoxygenation: Pre-oxygenation with 100% oxygen Induction Type: IV induction Placement Confirmation: positive ETCO2 and breath sounds checked- equal and bilateral Dental Injury: Teeth and Oropharynx as per pre-operative assessment

## 2018-07-18 ENCOUNTER — Encounter (HOSPITAL_COMMUNITY)
Admission: EM | Disposition: A | Discharge status: Home / Self Care | Payer: Self-pay | Source: Home / Self Care | Attending: Emergency Medicine

## 2018-07-18 ENCOUNTER — Observation Stay (HOSPITAL_COMMUNITY): Payer: Medicare Other | Admitting: Anesthesiology

## 2018-07-18 ENCOUNTER — Encounter (HOSPITAL_COMMUNITY): Payer: Self-pay | Admitting: Internal Medicine

## 2018-07-18 DIAGNOSIS — K921 Melena: Secondary | ICD-10-CM | POA: Diagnosis not present

## 2018-07-18 DIAGNOSIS — D62 Acute posthemorrhagic anemia: Secondary | ICD-10-CM | POA: Diagnosis not present

## 2018-07-18 DIAGNOSIS — K449 Diaphragmatic hernia without obstruction or gangrene: Secondary | ICD-10-CM

## 2018-07-18 DIAGNOSIS — I159 Secondary hypertension, unspecified: Secondary | ICD-10-CM

## 2018-07-18 DIAGNOSIS — K5730 Diverticulosis of large intestine without perforation or abscess without bleeding: Secondary | ICD-10-CM | POA: Diagnosis not present

## 2018-07-18 DIAGNOSIS — K640 First degree hemorrhoids: Secondary | ICD-10-CM | POA: Diagnosis not present

## 2018-07-18 HISTORY — PX: COLONOSCOPY WITH PROPOFOL: SHX5780

## 2018-07-18 LAB — CBC
HCT: 29.8 % — ABNORMAL LOW (ref 36.0–46.0)
HCT: 30.8 % — ABNORMAL LOW (ref 36.0–46.0)
Hemoglobin: 9.6 g/dL — ABNORMAL LOW (ref 12.0–15.0)
Hemoglobin: 9.7 g/dL — ABNORMAL LOW (ref 12.0–15.0)
MCH: 30 pg (ref 26.0–34.0)
MCH: 29.4 pg (ref 26.0–34.0)
MCHC: 32.2 g/dL (ref 30.0–36.0)
MCHC: 31.5 g/dL (ref 30.0–36.0)
MCV: 93.1 fL (ref 80.0–100.0)
MCV: 93.3 fL (ref 80.0–100.0)
Platelets: 176 10*3/uL (ref 150–400)
Platelets: 169 10*3/uL (ref 150–400)
RBC: 3.2 MIL/uL — ABNORMAL LOW (ref 3.87–5.11)
RBC: 3.3 MIL/uL — ABNORMAL LOW (ref 3.87–5.11)
RDW: 13.5 % (ref 11.5–15.5)
RDW: 13.5 % (ref 11.5–15.5)
WBC: 5.1 10*3/uL (ref 4.0–10.5)
WBC: 4 10*3/uL (ref 4.0–10.5)
nRBC: 0 % (ref 0.0–0.2)
nRBC: 0 % (ref 0.0–0.2)

## 2018-07-18 LAB — BASIC METABOLIC PANEL
Anion gap: 7 (ref 5–15)
BUN: 9 mg/dL (ref 8–23)
CO2: 27 mmol/L (ref 22–32)
Calcium: 8.2 mg/dL — ABNORMAL LOW (ref 8.9–10.3)
Chloride: 109 mmol/L (ref 98–111)
Creatinine, Ser: 0.62 mg/dL (ref 0.44–1.00)
GFR calc Af Amer: >60 mL/min (ref >60)
GFR calc non Af Amer: >60 mL/min (ref >60)
Glucose, Bld: 106 mg/dL — ABNORMAL HIGH (ref 70–99)
Potassium: 3.7 mmol/L (ref 3.5–5.1)
Sodium: 143 mmol/L (ref 135–145)

## 2018-07-18 SURGERY — COLONOSCOPY WITH PROPOFOL
Anesthesia: Monitor Anesthesia Care

## 2018-07-18 MED ORDER — PROPOFOL 500 MG/50ML IV EMUL
INTRAVENOUS | Status: DC | PRN
  Administered 2018-07-18: 100 ug/kg/min via INTRAVENOUS

## 2018-07-18 MED ORDER — SODIUM CHLORIDE 0.9 % IV SOLN: INTRAVENOUS | Status: DC

## 2018-07-18 MED ORDER — LACTATED RINGERS IV SOLN
INTRAVENOUS | Status: DC
  Administered 2018-07-18 (×2): via INTRAVENOUS

## 2018-07-18 SURGICAL SUPPLY — 21 items

## 2018-07-18 NOTE — Op Note (Signed)
[facility] Patient Name: Brittany Middleton Procedure Date : 07/18/2018 MRN: [id number] Attending MD: Nancy Fetter Dr., MD Date of Birth: Jun 03, 1951 CSN: [id number] Age: 68 Admit Type: Inpatient Procedure:                Colonoscopy Indications:              Rectal bleeding, EGD yesterday negative Providers:                Jeneen Rinks L. Jayleigh Notarianni Dr., MD, Zenon Mayo, RN, Cherylynn Ridges, Technician, Tinnie Gens, Technician Referring MD:              Medicines:                Monitored Anesthesia Care Complications:            No immediate complications. Estimated Blood Loss:     Estimated blood loss: none. Procedure:                Pre-Anesthesia Assessment:                           - Prior to the procedure, a History and Physical                            was performed, and patient medications and                            allergies were reviewed. The patient's tolerance of                            previous anesthesia was also reviewed. The risks                            and benefits of the procedure and the sedation                            options and risks were discussed with the patient.                            All questions were answered, and informed consent                            was obtained. Prior Anticoagulants: The patient has                            taken no previous anticoagulant or antiplatelet                            agents. ASA Grade Assessment: III - A patient with                            severe systemic disease. After reviewing the risks  and benefits, the patient was deemed in                            satisfactory condition to undergo the procedure.                           After obtaining informed consent, the colonoscope                            was passed under direct vision. Throughout the                            procedure, the patient's blood pressure, pulse, and                          oxygen saturations were monitored continuously. The                            CF-HQ190L ([id number] Olympus colonoscope was                            introduced through the anus and advanced to the the                            cecum, identified by appendiceal orifice and                            ileocecal valve. The colonoscopy was somewhat                            difficult due to significant looping. Successful                            completion of the procedure was aided by applying                            abdominal pressure. The patient tolerated the                            procedure well. The quality of the bowel                            preparation was adequate. The ileocecal valve,                            appendiceal orifice, and rectum were photographed. Scope In: 1:38:45 PM Scope Out: 2:10:40 PM Scope Withdrawal Time: 0 hours 13 minutes 4 seconds  Total Procedure Duration: 0 hours 31 minutes 55 seconds  Findings:      The perianal and digital rectal examinations were normal.      There is no endoscopic evidence of bleeding, mass, polyps, ulcerations       or tumor in the entire colon.      Non-bleeding internal hemorrhoids were found. The hemorrhoids were small       and Grade I (internal hemorrhoids  that do not prolapse).      Multiple large-mouthed diverticula were found in the sigmoid colon and       descending colon. Impression:               - Non-bleeding internal hemorrhoids.                           - Diverticulosis in the sigmoid colon and in the                            descending colon.                           - No specimens collected.                           - Blood in stool without cause found on endoscopic                            exam Recommendation:           - Patient has a contact number available for                            emergencies. The signs and symptoms of potential                             delayed complications were discussed with the                            patient. Return to normal activities tomorrow.                            Written discharge instructions were provided to the                            patient.                           - Resume previous diet.                           - Continue present medications.                           - Repeat colonoscopy in 10 years for screening                            purposes.                           - Return to endoscopist in 1 month. Procedure Code(s):        --- Professional ---                           [phone], Colonoscopy, flexible; diagnostic, including  collection of specimen(s) by brushing or washing,                            when performed (separate procedure) Diagnosis Code(s):        --- Professional ---                           K62.5, Hemorrhage of anus and rectum                           K57.30, Diverticulosis of large intestine without                            perforation or abscess without bleeding                           K64.0, First degree hemorrhoids CPT copyright 2018 [employer]. All rights reserved. The codes documented in this report are preliminary and upon coder review may  be revised to meet current compliance requirements. Nancy Fetter Dr., MD 07/18/2018 2:22:28 PM This report has been signed electronically. Number of Addenda: 0

## 2018-07-18 NOTE — Progress Notes (Signed)
[facility] PROGRESS NOTE  Brittany Middleton HYQ:[id number] DOB: 05/27/1951 DOA: 07/16/2018 PCP: Maurice Small, MD  Assessment/Plan:  #1. GI beed. No further episodes bldy stool. EGD reveals hiatal hernia and no source of bleeding per report. Tolerating prep for colonoscopy well.  Home meds include plavix. No nsaid use. Last colonoscoy was "fine".  -consider stopping ppi gtt -npo -colonoscopy for this afternoon per GI -hold plavix and asa  #2. Acute blood loss anemia. Hg trending down to 9.5 from 10.0 yesterday. No further blood noted in stool.. -monitor cbc -transfuse as indicated  #3, HTN. Controlled. Home meds include lisinopril and HCTZ -hold home meds for now -prn hydralazine   #4. Hypokalemia. Likely related to gi losses in setting of HCTZ. Potassium 3.7 this am after kdur. Mg level 1.9 -monitor -recheck in am   Code Status: full Family Communication: none present Disposition Plan: likley home tomorrow.    Consultants:  [facility]  Procedures:  EGD 07/17/18  Colonoscopy 07/18/18  Antibiotics:    HPI/Subjective: Brittany Middleton is a 68 y.o. female here with melena.  No prior history.  EGD yesterday unrevealing. Hgb stable.  colonoscopy today  Sitting on side of bed drinking prep. No acute distress. Denies pain/discomfort. Denies "fresh" blood in stool. Reports small amount of "old" blood    Objective: Vitals:   07/18/18 0011 07/18/18 0746  BP: (!) 140/47 (!) 145/56  Pulse:  64  Resp:    Temp:  97.9 F (36.6 C)  SpO2:  97%    Intake/Output Summary (Last 24 hours) at 07/18/2018 1005 Last data filed at 07/17/2018 1500 Gross per 24 hour  Intake 841.21 ml  Output -  Net 841.21 ml   Filed Weights   07/17/18 0348  Weight: 118.6 kg    Exam:   General:  Well nourished no acute distress   Cardiovascular: rrr no mgr no LE edema  Respiratory: normal effort BS clear bilaterally no wheeze  Abdomen: obese soft +BS no guarding or  rebounding  Musculoskeletal: joints without swelling/erythema, full rom  Data Reviewed: Basic Metabolic Panel: Recent Labs  Lab 07/16/18 1748 07/17/18 0717 07/17/18 1538 07/18/18 0217  NA 140 138  --  143  K 3.5 3.3*  --  3.7  CL 100 104  --  109  CO2 28 25  --  27  GLUCOSE 108* 113*  --  106*  BUN 16 17  --  9  CREATININE 0.74 0.64  --  0.62  CALCIUM 9.0 8.4*  --  8.2*  MG  --   --  1.9  --    Liver Function Tests: Recent Labs  Lab 07/16/18 1748  AST 28  ALT 22  ALKPHOS 45  BILITOT 0.6  PROT 6.8  ALBUMIN 4.0   No results for input(s): LIPASE, AMYLASE in the last 168 hours. No results for input(s): AMMONIA in the last 168 hours. CBC: Recent Labs  Lab 07/16/18 1748 07/17/18 0149 07/17/18 0717 07/17/18 1538 07/18/18 0217  WBC 5.4 4.7 4.1  --  5.1  NEUTROABS 3.2  --   --   --   --   HGB 12.7 10.6* 10.0* 10.3* 9.6*  HCT 40.7 33.3* 31.4* 33.1* 29.8*  MCV 92.9 92.2 91.5  --  93.1  PLT 195 179 166  --  176   Cardiac Enzymes: No results for input(s): CKTOTAL, CKMB, CKMBINDEX, TROPONINI in the last 168 hours. BNP (last 3 results) No results for input(s): BNP in the last 8760 hours.  ProBNP (last 3 results) No results for input(s): PROBNP in the last 8760 hours.  CBG: No results for input(s): GLUCAP in the last 168 hours.  No results found for this or any previous visit (from the past 240 hour(s)).   Studies: Dg Abdomen Acute W/chest  Result Date: 07/16/2018 CLINICAL DATA:  Dark stools with abdominal pain EXAM: DG ABDOMEN ACUTE W/ 1V CHEST COMPARISON:  02/27/2018 FINDINGS: Cardiac shadows within normal limits. The lungs are well aerated bilaterally. No focal infiltrate or sizable effusion is seen. Scattered large and small bowel gas is noted. Mild retained fecal material is noted primarily within the right colon. No obstructive changes are seen. No free air is noted. Degenerative changes of the lumbar spine are seen. IMPRESSION: Mild retained fecal material.   No acute abnormality noted. Electronically Signed   By: Inez Catalina M.D.   On: 07/16/2018 18:58    Scheduled Meds: Continuous Infusions: . sodium chloride 75 mL/hr at 07/17/18 2309  . pantoprozole (PROTONIX) infusion 8 mg/hr (07/17/18 2309)    Principal Problem:   Gastrointestinal hemorrhage with melena Active Problems:   Acute blood loss anemia   HTN (hypertension)   Hypokalemia    Time spent: 45 minutes    [place] NP [facility]  If 7PM-7AM, please contact night-coverage at [email or web address], password [facility] 07/18/2018, 10:05 AM  LOS: 0 days

## 2018-07-18 NOTE — Anesthesia Preprocedure Evaluation (Signed)
Anesthesia Evaluation  Patient identified by MRN, date of birth, ID band Patient awake    Reviewed: Allergy & Precautions, NPO status , Patient's Chart, lab work & pertinent test results  Airway Mallampati: III  TM Distance: >3 FB Neck ROM: Full    Dental  (+) Teeth Intact, Dental Advisory Given   Pulmonary sleep apnea and Continuous Positive Airway Pressure Ventilation ,    Pulmonary exam normal breath sounds clear to auscultation       Cardiovascular hypertension, Pt. on medications + CAD  Normal cardiovascular exam Rhythm:Regular Rate:Normal     Neuro/Psych negative neurological ROS     GI/Hepatic negative GI ROS, Neg liver ROS, GI bleed    Endo/Other  Morbid obesity  Renal/GU negative Renal ROS     Musculoskeletal negative musculoskeletal ROS (+)   Abdominal   Peds  Hematology  (+) Blood dyscrasia (Plavix), anemia ,   Anesthesia Other Findings Day of surgery medications reviewed with the patient.  Reproductive/Obstetrics                             Anesthesia Physical  Anesthesia Plan  ASA: III  Anesthesia Plan: MAC   Post-op Pain Management:    Induction: Intravenous  PONV Risk Score and Plan: 2 and Treatment may vary due to age or medical condition and Propofol infusion  Airway Management Planned: Nasal Cannula and Natural Airway  Additional Equipment:   Intra-op Plan:   Post-operative Plan:   Informed Consent: I have reviewed the patients History and Physical, chart, labs and discussed the procedure including the risks, benefits and alternatives for the proposed anesthesia with the patient or authorized representative who has indicated his/her understanding and acceptance.     Dental advisory given  Plan Discussed with: CRNA and Anesthesiologist  Anesthesia Plan Comments:         Anesthesia Quick Evaluation

## 2018-07-18 NOTE — Transfer of Care (Signed)
Immediate Anesthesia Transfer of Care Note  Patient: Brittany Middleton  Procedure(s) Performed: COLONOSCOPY WITH PROPOFOL (N/A )  Patient Location: PACU and Endoscopy Unit  Anesthesia Type:MAC  Level of Consciousness: patient cooperative and responds to stimulation  Airway & Oxygen Therapy: Patient Spontanous Breathing  Post-op Assessment: Report given to RN and Post -op Vital signs reviewed and stable  Post vital signs: Reviewed and stable  Last Vitals:  Vitals Value Taken Time  BP    Temp    Pulse 72 07/18/2018  2:17 PM  Resp 9 07/18/2018  2:17 PM  SpO2 97 % 07/18/2018  2:17 PM  Vitals shown include unvalidated device data.  Last Pain:  Vitals:   07/18/18 1250  TempSrc: Oral  PainSc: 0-No pain         Complications: No apparent anesthesia complications

## 2018-07-18 NOTE — Interval H&P Note (Signed)
History and Physical Interval Note:  07/18/2018 1:22 PM  Brittany Middleton  has presented today for surgery, with the diagnosis of GI bleed  The various methods of treatment have been discussed with the patient and family. After consideration of risks, benefits and other options for treatment, the patient has consented to  Procedure(s): COLONOSCOPY WITH PROPOFOL (N/A) as a surgical intervention .  The patient's history has been reviewed, patient examined, no change in status, stable for surgery.  I have reviewed the patient's chart and labs.  Questions were answered to the patient's satisfaction.     Nancy Fetter

## 2018-07-19 ENCOUNTER — Encounter (HOSPITAL_COMMUNITY): Payer: Self-pay | Admitting: Gastroenterology

## 2018-07-19 DIAGNOSIS — K921 Melena: Secondary | ICD-10-CM | POA: Diagnosis not present

## 2018-07-19 LAB — CBC
HCT: 29.4 % — ABNORMAL LOW (ref 36.0–46.0)
Hemoglobin: 9.2 g/dL — ABNORMAL LOW (ref 12.0–15.0)
MCH: 29.1 pg (ref 26.0–34.0)
MCHC: 31.3 g/dL (ref 30.0–36.0)
MCV: 93 fL (ref 80.0–100.0)
Platelets: 169 10*3/uL (ref 150–400)
RBC: 3.16 MIL/uL — ABNORMAL LOW (ref 3.87–5.11)
RDW: 13.3 % (ref 11.5–15.5)
WBC: 4.9 10*3/uL (ref 4.0–10.5)
nRBC: 0 % (ref 0.0–0.2)

## 2018-07-19 LAB — BASIC METABOLIC PANEL
Anion gap: 10 (ref 5–15)
BUN: 7 mg/dL — ABNORMAL LOW (ref 8–23)
CO2: 26 mmol/L (ref 22–32)
Calcium: 8.3 mg/dL — ABNORMAL LOW (ref 8.9–10.3)
Chloride: 107 mmol/L (ref 98–111)
Creatinine, Ser: 0.64 mg/dL (ref 0.44–1.00)
GFR calc Af Amer: >60 mL/min (ref >60)
GFR calc non Af Amer: >60 mL/min (ref >60)
Glucose, Bld: 110 mg/dL — ABNORMAL HIGH (ref 70–99)
Potassium: 3.8 mmol/L (ref 3.5–5.1)
Sodium: 143 mmol/L (ref 135–145)

## 2018-07-19 NOTE — Discharge Instructions (Signed)
Gastrointestinal Bleeding  Gastrointestinal (GI) bleeding is bleeding somewhere along the digestive tract, between the mouth and anus. This can be caused by various problems. The severity of these problems can range from mild to serious or even life-threatening. If you have GI bleeding, you may find blood in your stools (feces), you may have black stools, or you may vomit blood. If there is a lot of bleeding, you may need to stay in the hospital.  What are the causes?  This condition may be caused by:   Esophagitis. This is inflammation, irritation, or swelling of the esophagus.   Hemorrhoids.These are swollen veins in the rectum.   Anal fissures.These are areas of painful tearing that are often caused by passing hard stool.   Diverticulosis.These are pouches that form on the colon over time, with age, and may bleed a lot.   Diverticulitis.This is inflammation in areas with diverticulosis. It can cause pain, fever, and bloody stools, although bleeding may be mild.   Polyps and cancer. Colon cancer often starts out as precancerous polyps.   Gastritis and ulcers. With these, bleeding may come from the upper GI tract, near the stomach.  What are the signs or symptoms?  Symptoms of this condition may include:   Bright red blood in your vomit, or vomit that looks like coffee grounds.   Bloody, black, or tarry stools.  ? Bleeding from the lower GI tract will usually cause red or maroon blood in the stools.  ? Bleeding from the upper GI tract may cause black, tarry, often bad-smelling stools.  ? In certain cases, if the bleeding is fast enough, the stools may be red.   Pain or cramping in the abdomen.  How is this diagnosed?  This condition may be diagnosed based on:   Medical history and physical exam.   Various tests, such as:  ? Blood tests.  ? X-rays and other imaging tests.  ? Esophagogastroduodenoscopy (EGD). In this test, a flexible, lighted tube is used to look at your esophagus, stomach, and small  intestine.  ? Colonoscopy. In this test, a flexible, lighted tube is used to look at your colon.  How is this treated?  Treatment for this condition depends on the cause of the bleeding. For example:   For bleeding from the esophagus, stomach, small intestine, or colon, the health care provider doing your EGD or colonoscopy may be able to stop the bleeding as part of the procedure.   Inflammation or infection of the colon can be treated with medicines.   Certain rectal problems can be treated with creams, suppositories, or warm baths.   Surgery is sometimes needed.   Blood transfusions are sometimes needed if a lot of blood has been lost.  If bleeding is slow, you may be allowed to go home. If there is a lot of bleeding, you will need to stay in the hospital for observation.  Follow these instructions at home:     Take over-the-counter and prescription medicines only as told by your health care provider.   Eat foods that are high in fiber. This will help to keep your stools soft. These foods include whole grains, legumes, fruits, and vegetables. Eating 1-3 prunes each day works well for many people.   Drink enough fluid to keep your urine clear or pale yellow.   Keep all follow-up visits as told by your health care provider. This is important.  Contact a health care provider if:   Your symptoms do not improve.    Get help right away if:   Your bleeding increases.   You feel light-headed or you faint.   You feel weak.   You have severe cramps in your back or abdomen.   You pass large blood clots in your stool.   Your symptoms are getting worse.  This information is not intended to replace advice given to you by your health care provider. Make sure you discuss any questions you have with your health care provider.  Document Released: 06/04/2000 Document Revised: 11/05/2015 Document Reviewed: 11/25/2014  Elsevier Interactive Patient Education  2019 [employer]

## 2018-07-19 NOTE — Progress Notes (Signed)
GI Discharge/Sign-Off Note  Subjective: Patient had bowel movement this morning that was brown liquid with no melena or bright red blood.  Hemoglobin basically stable should be adequate for discharge Principal Problem:   Gastrointestinal hemorrhage with melena Active Problems:   HTN (hypertension)   Acute blood loss anemia   Hypokalemia   Hiatal hernia   Results for orders placed or performed during the hospital encounter of 07/16/18 (from the past 72 hour(s))  Comprehensive metabolic panel     Status: Abnormal   Collection Time: 07/16/18  5:48 PM  Result Value Ref Range   Sodium 140 135 - 145 mmol/L   Potassium 3.5 3.5 - 5.1 mmol/L   Chloride 100 98 - 111 mmol/L   CO2 28 22 - 32 mmol/L   Glucose, Bld 108 (H) 70 - 99 mg/dL   BUN 16 8 - 23 mg/dL   Creatinine, Ser 0.74 0.44 - 1.00 mg/dL   Calcium 9.0 8.9 - 10.3 mg/dL   Total Protein 6.8 6.5 - 8.1 g/dL   Albumin 4.0 3.5 - 5.0 g/dL   AST 28 15 - 41 U/L   ALT 22 0 - 44 U/L   Alkaline Phosphatase 45 38 - 126 U/L   Total Bilirubin 0.6 0.3 - 1.2 mg/dL   GFR calc non Af Amer >60 >60 mL/min   GFR calc Af Amer >60 >60 mL/min   Anion gap 12 5 - 15    Comment: Performed at [facility], [address] [place], [place] [address]  Protime-INR     Status: None   Collection Time: 07/16/18  5:48 PM  Result Value Ref Range   Prothrombin Time 13.3 11.4 - 15.2 seconds   INR 1.02     Comment: Performed at [facility] [address], [place], [place] [address]  Type and screen [place]     Status: None   Collection Time: 07/16/18  5:48 PM  Result Value Ref Range   ABO/RH(D) B NEG    Antibody Screen NEG    Sample Expiration      07/19/2018 Performed at [facility], [place] [address], [place], [place] [address]   CBC with Differential     Status: None   Collection Time: 07/16/18  5:48 PM  Result Value Ref Range   WBC 5.4 4.0 - 10.5 K/uL   RBC 4.38 3.87 - 5.11 MIL/uL   Hemoglobin 12.7 12.0 - 15.0  g/dL   HCT 40.7 36.0 - 46.0 %   MCV 92.9 80.0 - 100.0 fL   MCH 29.0 26.0 - 34.0 pg   MCHC 31.2 30.0 - 36.0 g/dL   RDW 13.2 11.5 - 15.5 %   Platelets 195 150 - 400 K/uL   nRBC 0.0 0.0 - 0.2 %   Neutrophils Relative % 59 %   Neutro Abs 3.2 1.7 - 7.7 K/uL   Lymphocytes Relative 26 %   Lymphs Abs 1.4 0.7 - 4.0 K/uL   Monocytes Relative 10 %   Monocytes Absolute 0.5 0.1 - 1.0 K/uL   Eosinophils Relative 4 %   Eosinophils Absolute 0.2 0.0 - 0.5 K/uL   Basophils Relative 1 %   Basophils Absolute 0.1 0.0 - 0.1 K/uL   Immature Granulocytes 0 %   Abs Immature Granulocytes 0.02 0.00 - 0.07 K/uL    Comment: Performed at [facility], [address], [place], [place] [address]  ABO/Rh     Status: None  Collection Time: 07/16/18  5:48 PM  Result Value Ref Range   ABO/RH(D)      B NEG Performed at [facility] [address], [place], [place] [address]   CBC     Status: Abnormal   Collection Time: 07/17/18  1:49 AM  Result Value Ref Range   WBC 4.7 4.0 - 10.5 K/uL   RBC 3.61 (L) 3.87 - 5.11 MIL/uL   Hemoglobin 10.6 (L) 12.0 - 15.0 g/dL   HCT 33.3 (L) 36.0 - 46.0 %   MCV 92.2 80.0 - 100.0 fL   MCH 29.4 26.0 - 34.0 pg   MCHC 31.8 30.0 - 36.0 g/dL   RDW 13.1 11.5 - 15.5 %   Platelets 179 150 - 400 K/uL   nRBC 0.0 0.0 - 0.2 %    Comment: Performed at [facility], [place] [address] [place], [place] [address]  Basic metabolic panel     Status: Abnormal   Collection Time: 07/17/18  7:17 AM  Result Value Ref Range   Sodium 138 135 - 145 mmol/L   Potassium 3.3 (L) 3.5 - 5.1 mmol/L   Chloride 104 98 - 111 mmol/L   CO2 25 22 - 32 mmol/L   Glucose, Bld 113 (H) 70 - 99 mg/dL   BUN 17 8 - 23 mg/dL   Creatinine, Ser 0.64 0.44 - 1.00 mg/dL   Calcium 8.4 (L) 8.9 - 10.3 mg/dL   GFR calc non Af Amer >60 >60 mL/min   GFR calc Af Amer >60 >60 mL/min   Anion gap 9 5 - 15    Comment: Performed at [facility] [address], [place], [place] [address]  CBC     Status:  Abnormal   Collection Time: 07/17/18  7:17 AM  Result Value Ref Range   WBC 4.1 4.0 - 10.5 K/uL   RBC 3.43 (L) 3.87 - 5.11 MIL/uL   Hemoglobin 10.0 (L) 12.0 - 15.0 g/dL   HCT 31.4 (L) 36.0 - 46.0 %   MCV 91.5 80.0 - 100.0 fL   MCH 29.2 26.0 - 34.0 pg   MCHC 31.8 30.0 - 36.0 g/dL   RDW 13.3 11.5 - 15.5 %   Platelets 166 150 - 400 K/uL   nRBC 0.0 0.0 - 0.2 %    Comment: Performed at [facility], [place] [address] [place], [place] [address]  Magnesium     Status: None   Collection Time: 07/17/18  3:38 PM  Result Value Ref Range   Magnesium 1.9 1.7 - 2.4 mg/dL    Comment: Performed at [facility], [place] [address] [place], [place] [address]  Hemoglobin and hematocrit, blood     Status: Abnormal   Collection Time: 07/17/18  3:38 PM  Result Value Ref Range   Hemoglobin 10.3 (L) 12.0 - 15.0 g/dL   HCT 33.1 (L) 36.0 - 46.0 %    Comment: Performed at [facility], [place] [address] [place] [address]  CBC     Status: Abnormal   Collection Time: 07/18/18  2:17 AM  Result Value Ref Range   WBC 5.1 4.0 - 10.5 K/uL   RBC 3.20 (L) 3.87 - 5.11 MIL/uL   Hemoglobin 9.6 (L) 12.0 - 15.0 g/dL   HCT 29.8 (L) 36.0 - 46.0 %   MCV 93.1 80.0 - 100.0 fL   MCH 30.0 26.0 - 34.0 pg   MCHC 32.2 30.0 - 36.0 g/dL  RDW 13.5 11.5 - 15.5 %   Platelets 176 150 - 400 K/uL   nRBC 0.0 0.0 - 0.2 %    Comment: Performed at [facility], [place] [address], [place], [place] [address]  Basic metabolic panel     Status: Abnormal   Collection Time: 07/18/18  2:17 AM  Result Value Ref Range   Sodium 143 135 - 145 mmol/L   Potassium 3.7 3.5 - 5.1 mmol/L   Chloride 109 98 - 111 mmol/L   CO2 27 22 - 32 mmol/L   Glucose, Bld 106 (H) 70 - 99 mg/dL   BUN 9 8 - 23 mg/dL   Creatinine, Ser 0.62 0.44 - 1.00 mg/dL   Calcium 8.2 (L) 8.9 - 10.3 mg/dL   GFR calc non Af Amer >60 >60 mL/min   GFR calc Af Amer >60 >60 mL/min   Anion gap 7 5 - 15    Comment: Performed at [facility] [address], [place], [place] [address]  CBC     Status: Abnormal   Collection Time: 07/18/18  3:24 PM  Result Value Ref Range   WBC 4.0 4.0 - 10.5 K/uL   RBC 3.30 (L) 3.87 - 5.11 MIL/uL   Hemoglobin 9.7 (L) 12.0 - 15.0 g/dL   HCT 30.8 (L) 36.0 - 46.0 %   MCV 93.3 80.0 - 100.0 fL   MCH 29.4 26.0 - 34.0 pg   MCHC 31.5 30.0 - 36.0 g/dL   RDW 13.5 11.5 - 15.5 %   Platelets 169 150 - 400 K/uL   nRBC 0.0 0.0 - 0.2 %    Comment: Performed at [facility], [place] [address], [place], [place] [address]  Basic metabolic panel     Status: Abnormal   Collection Time: 07/19/18  2:37 AM  Result Value Ref Range   Sodium 143 135 - 145 mmol/L   Potassium 3.8 3.5 - 5.1 mmol/L   Chloride 107 98 - 111 mmol/L   CO2 26 22 - 32 mmol/L   Glucose, Bld 110 (H) 70 - 99 mg/dL   BUN 7 (L) 8 - 23 mg/dL   Creatinine, Ser 0.64 0.44 - 1.00 mg/dL   Calcium 8.3 (L) 8.9 - 10.3 mg/dL   GFR calc non Af Amer >60 >60 mL/min   GFR calc Af Amer >60 >60 mL/min   Anion gap 10 5 - 15    Comment: Performed at [facility], [place] [address] [place], [place] [address]  CBC     Status: Abnormal   Collection Time: 07/19/18  2:37 AM  Result Value Ref Range   WBC 4.9 4.0 - 10.5 K/uL   RBC 3.16 (L) 3.87 - 5.11 MIL/uL   Hemoglobin 9.2 (L) 12.0 - 15.0 g/dL   HCT 29.4 (L) 36.0 - 46.0 %   MCV 93.0 80.0 - 100.0 fL   MCH 29.1 26.0 - 34.0 pg   MCHC 31.3 30.0 - 36.0 g/dL   RDW 13.3 11.5 - 15.5 %   Platelets 169 150 - 400 K/uL   nRBC 0.0 0.0 - 0.2 %    Comment: Performed at [facility], [place] [address], [place], [place] [address]    No results found.  @[email or web address] @  GI DISCHARGE PLANNING:  Diet: Regular  Anticoagulation/Antiplatelet Rx Suggestions: Hello  GI Medications: Fiber or Metamucil  Labs/Procedures Ordered:  GI FOLLOW UP:  Call [phone] to make appointment.   Doctor: Dr. Laurence Spates  Time:  2 months   Laurence Spates, MD   Pager [phone] If no answer or after hours call [phone]

## 2018-07-19 NOTE — Anesthesia Postprocedure Evaluation (Signed)
Anesthesia Post Note  Patient: Brittany Middleton  Procedure(s) Performed: COLONOSCOPY WITH PROPOFOL (N/A )     Patient location during evaluation: PACU Anesthesia Type: MAC Level of consciousness: awake and alert Pain management: pain level controlled Vital Signs Assessment: post-procedure vital signs reviewed and stable Respiratory status: spontaneous breathing, nonlabored ventilation, respiratory function stable and patient connected to nasal cannula oxygen Cardiovascular status: stable and blood pressure returned to baseline Postop Assessment: no apparent nausea or vomiting Anesthetic complications: no    Last Vitals:  Vitals:   07/18/18 1651 07/18/18 2318  BP: 116/65 (!) 141/56  Pulse: 60 70  Resp: 18 17  Temp: 36.7 C 36.8 C  SpO2: 96% 97%    Last Pain:  Vitals:   07/18/18 2318  TempSrc: Oral  PainSc: 0-No pain   Pain Goal:                   [place]lem[place] General

## 2018-07-19 NOTE — Discharge Summary (Signed)
Physician Discharge Summary  [name] OZD:[id number] DOB: [birth year] DOA: 07/16/2018  PCP: Maurice Small, MD  Admit date: 07/16/2018 Discharge date: 07/19/2018  Admitted From: Home Disposition:  Home  Recommendations for Outpatient Follow-up:  1. Follow up with PCP in 1 week 2. Follow up with Dr. Oletta Lamas in 2 months  3. Please obtain CBC in 1 week   Discharge Condition: Stable CODE STATUS: Full  Diet recommendation: Heart healthy   Brief/Interim Summary: HPI per Dr. Alcario Drought: Brittany Middleton is a 68 y.o. female with medical history significant of morbid obesity, HTN.  Patient presents to the ED with complaints of 5 episodes of black tarry stool with some blood today.  Has had some RUQ pain since she bumped abdomen earlier today on table and has a bruise.  No CP, no lightheadedness.    Interim history: Aspirin and Plavix were held on admission.  GI was consulted.  Patient underwent EGD and colonoscopy.  Her hemoglobin remained stable.  Her bleeding source could be possibly secondary to diverticular source.  Aspirin and Plavix may be resumed in 5 days per GI recommendation.  Patient discharged home in stable condition.   Discharge Diagnoses:  Principal Problem:   Gastrointestinal hemorrhage with melena Active Problems:   HTN (hypertension)   Acute blood loss anemia   Hypokalemia   Hiatal hernia   Discharge Instructions  Discharge Instructions    Call MD for:   Complete by:  As directed    Persistent bleeding   Call MD for:  difficulty breathing, headache or visual disturbances   Complete by:  As directed    Call MD for:  extreme fatigue   Complete by:  As directed    Call MD for:  hives   Complete by:  As directed    Call MD for:  persistant dizziness or light-headedness   Complete by:  As directed    Call MD for:  persistant nausea and vomiting   Complete by:  As directed    Call MD for:  severe uncontrolled pain   Complete by:  As directed    Call MD for:   temperature >100.4   Complete by:  As directed    Diet - low sodium heart healthy   Complete by:  As directed    Discharge instructions   Complete by:  As directed    You were cared for by a hospitalist during your hospital stay. If you have any questions about your discharge medications or the care you received while you were in the hospital after you are discharged, you can call the unit and ask to speak with the hospitalist on call if the hospitalist that took care of you is not available. Once you are discharged, your primary care physician will handle any further medical issues. Please note that NO REFILLS for any discharge medications will be authorized once you are discharged, as it is imperative that you return to your primary care physician (or establish a relationship with a primary care physician if you do not have one) for your aftercare needs so that they can reassess your need for medications and monitor your lab values.   Discharge instructions   Complete by:  As directed    Hold aspirin and plavix for 5 days, you may resume them on 07/23/2018   Increase activity slowly   Complete by:  As directed      Allergies as of 07/19/2018      Reactions   Ciprofloxacin  PAIN IN HANDS    Codeine Nausea And Vomiting      Medication List    STOP taking these medications   aspirin EC 81 MG tablet   clopidogrel 75 MG tablet Commonly known as:  PLAVIX   nitroGLYCERIN 0.4 MG SL tablet Commonly known as:  NITROSTAT     TAKE these medications   acetaminophen 500 MG tablet Commonly known as:  TYLENOL Take 1,000 mg by mouth every 6 (six) hours as needed for mild pain, moderate pain, fever or headache.   cholecalciferol 1000 units tablet Commonly known as:  VITAMIN D Take 1,000 Units by mouth daily.   CO Q 10 PO Take 1 tablet by mouth daily.   IRON PO Take 1 tablet by mouth every other day.   lisinopril-hydrochlorothiazide 20-25 MG tablet Commonly known as:   PRINZIDE,ZESTORETIC Take 1 tablet by mouth daily.   Melatonin 5 MG Chew Chew 5 mg by mouth at bedtime.   multivitamin with minerals tablet Take 1 tablet by mouth daily.   rosuvastatin 10 MG tablet Commonly known as:  CRESTOR Take 1 tablet (10 mg total) by mouth daily.   VITAMIN B 12 PO Take 1 tablet by mouth daily.      Follow-up Information    Maurice Small, MD. Schedule an appointment as soon as possible for a visit in 1 week(s).   Specialty:  Family Medicine Contact information: [address] [place] [address] [phone]        Laurence Spates, MD. Schedule an appointment as soon as possible for a visit in 2 month(s).   Specialty:  Gastroenterology Contact information: [address] [place] [address] [phone]          Allergies  Allergen Reactions  . Ciprofloxacin     PAIN IN HANDS   . Codeine Nausea And Vomiting    Consultations:  GI   Procedures/Studies: Dg Abdomen Acute W/chest  Result Date: 07/16/2018 CLINICAL DATA:  Dark stools with abdominal pain EXAM: DG ABDOMEN ACUTE W/ 1V CHEST COMPARISON:  02/27/2018 FINDINGS: Cardiac shadows within normal limits. The lungs are well aerated bilaterally. No focal infiltrate or sizable effusion is seen. Scattered large and small bowel gas is noted. Mild retained fecal material is noted primarily within the right colon. No obstructive changes are seen. No free air is noted. Degenerative changes of the lumbar spine are seen. IMPRESSION: Mild retained fecal material.  No acute abnormality noted. Electronically Signed   By: Inez Catalina M.D.   On: 07/16/2018 18:58    Colonoscopy 1/28 Findings:      The perianal and digital rectal examinations were normal.      There is no endoscopic evidence of bleeding, mass, polyps, ulcerations       or tumor in the entire colon.      Non-bleeding internal hemorrhoids were found. The hemorrhoids were small       and Grade I (internal  hemorrhoids that do not prolapse).      Multiple large-mouthed diverticula were found in the sigmoid colon and       descending colon. Impression:       - Non-bleeding internal hemorrhoids.                           - Diverticulosis in the sigmoid colon and in the  descending colon.                           - No specimens collected.                           - Blood in stool without cause found on endoscopic exam    EGD 1/27 Findings:      A medium-sized hiatal hernia was present.      The stomach was normal.      The examined duodenum was normal. Impression:       - Medium-sized hiatal hernia.                           - Normal stomach.                           - Normal examined duodenum.                           - No specimens collected.                           - Blood in stool without cause found on endoscopic exam  Discharge Exam: Vitals:   07/18/18 2318 07/19/18 0728  BP: (!) 141/56 129/80  Pulse: 70 62  Resp: 17   Temp: 98.3 F (36.8 C) 98.8 F (37.1 C)  SpO2: 97% 97%    General: Pt is alert, awake, not in acute distress Cardiovascular: RRR, S1/S2 +, no rubs, no gallops Respiratory: CTA bilaterally, no wheezing, no rhonchi Abdominal: Soft, NT, ND, bowel sounds + Extremities: no edema, no cyanosis    The results of significant diagnostics from this hospitalization (including imaging, microbiology, ancillary and laboratory) are listed below for reference.     Microbiology: No results found for this or any previous visit (from the past 240 hour(s)).   Labs: BNP (last 3 results) No results for input(s): BNP in the last 8760 hours. Basic Metabolic Panel: Recent Labs  Lab 07/16/18 1748 07/17/18 0717 07/17/18 1538 07/18/18 0217 07/19/18 0237  NA 140 138  --  143 143  K 3.5 3.3*  --  3.7 3.8  CL 100 104  --  109 107  CO2 28 25  --  27 26  GLUCOSE 108* 113*  --  106* 110*  BUN 16 17  --  9 7*  CREATININE 0.74 0.64  --  0.62  0.64  CALCIUM 9.0 8.4*  --  8.2* 8.3*  MG  --   --  1.9  --   --    Liver Function Tests: Recent Labs  Lab 07/16/18 1748  AST 28  ALT 22  ALKPHOS 45  BILITOT 0.6  PROT 6.8  ALBUMIN 4.0   No results for input(s): LIPASE, AMYLASE in the last 168 hours. No results for input(s): AMMONIA in the last 168 hours. CBC: Recent Labs  Lab 07/16/18 1748 07/17/18 0149 07/17/18 0717 07/17/18 1538 07/18/18 0217 07/18/18 1524 07/19/18 0237  WBC 5.4 4.7 4.1  --  5.1 4.0 4.9  NEUTROABS 3.2  --   --   --   --   --   --   HGB 12.7 10.6* 10.0* 10.3* 9.6* 9.7* 9.2*  HCT 40.7 33.3* 31.4* 33.1* 29.8* 30.8* 29.4*  MCV 92.9 92.2 91.5  --  93.1 93.3 93.0  PLT 195 179 166  --  176 169 169   Cardiac Enzymes: No results for input(s): CKTOTAL, CKMB, CKMBINDEX, TROPONINI in the last 168 hours. BNP: Invalid input(s): POCBNP CBG: No results for input(s): GLUCAP in the last 168 hours. D-Dimer No results for input(s): DDIMER in the last 72 hours. Hgb A1c No results for input(s): HGBA1C in the last 72 hours. Lipid Profile No results for input(s): CHOL, HDL, LDLCALC, TRIG, CHOLHDL, LDLDIRECT in the last 72 hours. Thyroid function studies No results for input(s): TSH, T4TOTAL, T3FREE, THYROIDAB in the last 72 hours.  Invalid input(s): FREET3 Anemia work up No results for input(s): VITAMINB12, FOLATE, FERRITIN, TIBC, IRON, RETICCTPCT in the last 72 hours. Urinalysis    Component Value Date/Time   COLORURINE YELLOW 02/27/2018 [place] 02/27/2018 1541   LABSPEC 1.025 02/27/2018 1541   PHURINE 6.0 02/27/2018 1541   GLUCOSEU NEGATIVE 02/27/2018 1541   HGBUR MODERATE (A) 02/27/2018 1541   BILIRUBINUR NEGATIVE 02/27/2018 1541   KETONESUR NEGATIVE 02/27/2018 1541   PROTEINUR NEGATIVE 02/27/2018 1541   UROBILINOGEN 1.0 10/31/2014 1646   NITRITE NEGATIVE 02/27/2018 1541   LEUKOCYTESUR NEGATIVE 02/27/2018 1541   Sepsis Labs Invalid input(s): PROCALCITONIN,  WBC,   LACTICIDVEN Microbiology No results found for this or any previous visit (from the past 240 hour(s)).   Patient was seen and examined on the day of discharge and was found to be in stable condition. Time coordinating discharge: 35 minutes including assessment and coordination of care, as well as examination of the patient.   SIGNED:  Dessa Phi, DO [facility] [email or web address] 07/19/2018, 2:12 PM

## 2018-07-20 ENCOUNTER — Telehealth: Payer: Self-pay | Admitting: *Deleted

## 2018-07-20 NOTE — Telephone Encounter (Signed)
In [facility] for rectal bleeding. Taken off of Plavix and Aspirin on Monday 07/17/18 and told to resume meds on Tues. 2/

## 2018-07-20 NOTE — Telephone Encounter (Signed)
Told to resume meds on Tues Feb. 4th. Pt has appt with Dr. Geraldo Pitter on 2/4 at 3:00 pm. Pt wanted to make being off these meds would be okay. Wants to discuss anticoagulation therapy at this next visit.

## 2018-07-20 NOTE — Telephone Encounter (Signed)
Patient reports recently being in the hospital for rectal bleeding. On 07/17/2018 they stopped her aspirin an plavix . She has a follow up appointment with Dr. Geraldo Pitter on 07/25/2018. She was advised to continuing holding aspirin and plavix until then. She would like to know if Dr. Geraldo Pitter agrees with this. I will route to him and follow up with patient once I have a response.

## 2018-07-21 ENCOUNTER — Encounter (HOSPITAL_COMMUNITY): Payer: Self-pay | Admitting: Gastroenterology

## 2018-07-21 NOTE — Telephone Encounter (Signed)
I discussed details with patient.

## 2018-07-25 ENCOUNTER — Ambulatory Visit (INDEPENDENT_AMBULATORY_CARE_PROVIDER_SITE_OTHER): Payer: Medicare Other | Admitting: Cardiology

## 2018-07-25 ENCOUNTER — Encounter: Payer: Self-pay | Admitting: Cardiology

## 2018-07-25 VITALS — BP 116/72 | HR 61 | Ht 66.0 in | Wt 266.0 lb

## 2018-07-25 DIAGNOSIS — I2541 Coronary artery aneurysm: Secondary | ICD-10-CM | POA: Diagnosis not present

## 2018-07-25 DIAGNOSIS — K922 Gastrointestinal hemorrhage, unspecified: Secondary | ICD-10-CM | POA: Insufficient documentation

## 2018-07-25 DIAGNOSIS — K921 Melena: Secondary | ICD-10-CM

## 2018-07-25 DIAGNOSIS — E782 Mixed hyperlipidemia: Secondary | ICD-10-CM

## 2018-07-25 DIAGNOSIS — I10 Essential (primary) hypertension: Secondary | ICD-10-CM

## 2018-07-25 HISTORY — DX: Gastrointestinal hemorrhage, unspecified: K92.2

## 2018-07-25 NOTE — Patient Instructions (Signed)
Medication Instructions:  Your physician has recommended you make the following change in your medication:  STOP aspirin  If you need a refill on your cardiac medications before your next appointment, please call your pharmacy.   Lab work: None  If you have labs (blood work) drawn today and your tests are completely normal, you will receive your results only by: Marland Kitchen MyChart Message (if you have MyChart) OR . A paper copy in the mail If you have any lab test that is abnormal or we need to change your treatment, we will call you to review the results.  Testing/Procedures: None  Follow-Up: At [facility], you and your health needs are our priority.  As part of our continuing mission to provide you with exceptional heart care, we have created designated Provider Care Teams.  These Care Teams include your primary Cardiologist (physician) and Advanced Practice Providers (APPs -  Physician Assistants and Nurse Practitioners) who all work together to provide you with the care you need, when you need it. You will need a follow up appointment in 6 months.  Please call our office 2 months in advance to schedule this appointment.

## 2018-07-25 NOTE — Progress Notes (Signed)
Cardiology Office Note:    Date:  07/25/2018   ID:  Brittany Middleton, DOB 1950-10-28, MRN [id number]  PCP:  Maurice Small, MD  Cardiologist:  Jenean Lindau, MD   Referring MD: Maurice Small, MD    ASSESSMENT:    1. Coronary aneurysm   2. Mixed hyperlipidemia   3. Essential hypertension   4. Morbid obesity ([place])   5. Gastrointestinal hemorrhage with melena    PLAN:    In order of problems listed above:  1. I discussed my findings with the patient at extensive length.  I reviewed her records.  In view of recent GI bleed and a low hemoglobin she has been cleared by her primary care physicians to restart aspirin and Plavix and she is taking both of them.  In view of that history I have asked the patient to stop aspirin and  continue the Plavix.  She does not have any bleeding at this point.  She is going to see her gastroenterologist in the next few days. 2. Her blood pressure is stable.  Weight reduction was stressed. 3. Patient will be seen in follow-up appointment in 6 months or earlier if the patient has any concerns    Medication Adjustments/Labs and Tests Ordered: Current medicines are reviewed at length with the patient today.  Concerns regarding medicines are outlined above.  No orders of the defined types were placed in this encounter.  No orders of the defined types were placed in this encounter.    No chief complaint on file.    History of Present Illness:    Brittany Middleton is a 68 y.o. female.  Patient has past medical history of coronary aneurysm, dyslipidemia and morbid obesity.  She denies any problems at this time and takes care of activities of daily living.  No chest pain orthopnea or PND.  She mentions to me that she recently had GI bleeding and underwent endoscopy  Past Medical History:  Diagnosis Date  . Cervical disc disease    MRI 2016  . Hepatic steatosis    per imaging 2016, 2019  . Hiatal hernia   . History of palpitations    Holter  monitor, 2017: NSR, occasional PVC, PACs, no arrhythmia  . Hypercholesteremia   . Hypertension   . Lumbar disc disease    MRI, 2017     Past Surgical History:  Procedure Laterality Date  . BLADDER REPAIR    . BREAST EXCISIONAL BIOPSY Left   . CARPAL TUNNEL RELEASE Right   . CATARACT EXTRACTION, BILATERAL  2014  . COLONOSCOPY WITH PROPOFOL N/A 07/18/2018   Procedure: COLONOSCOPY WITH PROPOFOL;  Surgeon: Laurence Spates, MD;  Location: [place];  Service: Endoscopy;  Laterality: N/A;  . ESOPHAGOGASTRODUODENOSCOPY (EGD) WITH PROPOFOL N/A 07/17/2018   Procedure: ESOPHAGOGASTRODUODENOSCOPY (EGD) WITH PROPOFOL;  Surgeon: Laurence Spates, MD;  Location: [place];  Service: Endoscopy;  Laterality: N/A;  . REPAIR RECTOCELE      Current Medications: Current Meds  Medication Sig  . acetaminophen (TYLENOL) 500 MG tablet Take 1,000 mg by mouth every 6 (six) hours as needed for mild pain, moderate pain, fever or headache.  Marland Kitchen aspirin EC 81 MG tablet Take 81 mg by mouth daily.  . cholecalciferol (VITAMIN D) 1000 UNITS tablet Take 1,000 Units by mouth daily.  . clopidogrel (PLAVIX) 75 MG tablet Take 75 mg by mouth daily.  . Coenzyme Q10 (CO Q 10 PO) Take 1 tablet by mouth daily.  . IRON PO Take 1 tablet  by mouth every other day.  Javier Docker Oil 300 MG CAPS Take 1 capsule by mouth daily.  Marland Kitchen lisinopril-hydrochlorothiazide (PRINZIDE,ZESTORETIC) 20-25 MG per tablet Take 1 tablet by mouth daily.  . Melatonin 5 MG CHEW Chew 5 mg by mouth at bedtime.  . Multiple Vitamins-Minerals (MULTIVITAMIN WITH MINERALS) tablet Take 1 tablet by mouth daily.     Allergies:   Ciprofloxacin and Codeine   Social History   Socioeconomic History  . Marital status: Divorced    Spouse name: Not on file  . Number of children: 2  . Years of education: Not on file  . Highest education level: Master's degree (e.g., MA, MS, MEng, MEd, MSW, MBA)  Occupational History  . Occupation: Retired  Scientific laboratory technician  . Financial  resource strain: Not hard at all  . Food insecurity:    Worry: Never true    Inability: Never true  . Transportation needs:    Medical: No    Non-medical: No  Tobacco Use  . Smoking status: Never Smoker  . Smokeless tobacco: Never Used  Substance and Sexual Activity  . Alcohol use: No  . Drug use: No  . Sexual activity: Not on file  Lifestyle  . Physical activity:    Days per week: 7 days    Minutes per session: 60 min  . Stress: Only a little  Relationships  . Social connections:    Talks on phone: More than three times a week    Gets together: More than three times a week    Attends religious service: Never    Active member of club or organization: Yes    Attends meetings of clubs or organizations: 1 to 4 times per year    Relationship status: Divorced  Other Topics Concern  . Not on file  Social History Narrative  . Not on file     Family History: The patient's family history includes Breast cancer (age of onset: 76) in her mother; CAD in her mother.  ROS:   Please see the history of present illness.    All other systems reviewed and are negative.  EKGs/Labs/Other Studies Reviewed:    The following studies were reviewed today: I discussed my findings with the patient at length today.   Recent Labs: 07/16/2018: ALT 22 07/17/2018: Magnesium 1.9 07/19/2018: BUN 7; Creatinine, Ser 0.64; Hemoglobin 9.2; Platelets 169; Potassium 3.8; Sodium 143  Recent Lipid Panel    Component Value Date/Time   CHOL 198 04/12/2018 1430   TRIG 308 (H) 04/12/2018 1430   HDL 47 04/12/2018 1430   CHOLHDL 4.2 04/12/2018 1430   CHOLHDL 4.5 02/28/2018 0345   VLDL 41 (H) 02/28/2018 0345   LDLCALC 89 04/12/2018 1430    Physical Exam:    VS:  BP 116/72 (BP Location: Right Arm, Patient Position: Sitting, Cuff Size: Normal)   Pulse 61   Ht 5\' 6"  (1.676 m)   Wt 266 lb (120.7 kg)   SpO2 96%   BMI 42.93 kg/m     Wt Readings from Last 3 Encounters:  07/25/18 266 lb (120.7 kg)    07/17/18 261 lb 8 oz (118.6 kg)  05/22/18 268 lb (121.6 kg)     GEN: Patient is in no acute distress HEENT: Normal NECK: No JVD; No carotid bruits LYMPHATICS: No lymphadenopathy CARDIAC: Hear sounds regular, 2/6 systolic murmur at the apex. RESPIRATORY:  Clear to auscultation without rales, wheezing or rhonchi  ABDOMEN: Soft, non-tender, non-distended MUSCULOSKELETAL:  No edema; No deformity  SKIN:  Warm and dry NEUROLOGIC:  Alert and oriented x 3 PSYCHIATRIC:  Normal affect   Signed, Jenean Lindau, MD  07/25/2018 3:51 PM    [facility]

## 2018-07-25 NOTE — Addendum Note (Signed)
Addended by: Austin Miles on: 07/25/2018 03:59 PM   Modules accepted: Orders

## 2018-07-25 NOTE — Progress Notes (Deleted)
Cardiology Office Note:    Date:  07/25/2018   ID:  Brittany Middleton, DOB 1950-09-02, MRN [id number]  PCP:  Maurice Small, MD  Cardiologist:  Jenean Lindau, MD   Referring MD: Maurice Small, MD    ASSESSMENT:    1. Coronary aneurysm   2. Mixed hyperlipidemia   3. Essential hypertension   4. Morbid obesity ([place])   5. Gastrointestinal hemorrhage with melena    PLAN:    In order of problems listed above:  1. ***   Medication Adjustments/Labs and Tests Ordered: Current medicines are reviewed at length with the patient today.  Concerns regarding medicines are outlined above.  No orders of the defined types were placed in this encounter.  No orders of the defined types were placed in this encounter.    No chief complaint on file.    History of Present Illness:    Brittany Middleton is a 68 y.o. female ***  Past Medical History:  Diagnosis Date  . Cervical disc disease    MRI 2016  . Hepatic steatosis    per imaging 2016, 2019  . Hiatal hernia   . History of palpitations    Holter monitor, 2017: NSR, occasional PVC, PACs, no arrhythmia  . Hypercholesteremia   . Hypertension   . Lumbar disc disease    MRI, 2017     Past Surgical History:  Procedure Laterality Date  . BLADDER REPAIR    . BREAST EXCISIONAL BIOPSY Left   . CARPAL TUNNEL RELEASE Right   . CATARACT EXTRACTION, BILATERAL  2014  . COLONOSCOPY WITH PROPOFOL N/A 07/18/2018   Procedure: COLONOSCOPY WITH PROPOFOL;  Surgeon: Laurence Spates, MD;  Location: [place];  Service: Endoscopy;  Laterality: N/A;  . ESOPHAGOGASTRODUODENOSCOPY (EGD) WITH PROPOFOL N/A 07/17/2018   Procedure: ESOPHAGOGASTRODUODENOSCOPY (EGD) WITH PROPOFOL;  Surgeon: Laurence Spates, MD;  Location: [place];  Service: Endoscopy;  Laterality: N/A;  . REPAIR RECTOCELE      Current Medications: Current Meds  Medication Sig  . acetaminophen (TYLENOL) 500 MG tablet Take 1,000 mg by mouth every 6 (six) hours as needed for mild  pain, moderate pain, fever or headache.  Marland Kitchen aspirin EC 81 MG tablet Take 81 mg by mouth daily.  . cholecalciferol (VITAMIN D) 1000 UNITS tablet Take 1,000 Units by mouth daily.  . clopidogrel (PLAVIX) 75 MG tablet Take 75 mg by mouth daily.  . Coenzyme Q10 (CO Q 10 PO) Take 1 tablet by mouth daily.  . IRON PO Take 1 tablet by mouth every other day.  Javier Docker Oil 300 MG CAPS Take 1 capsule by mouth daily.  Marland Kitchen lisinopril-hydrochlorothiazide (PRINZIDE,ZESTORETIC) 20-25 MG per tablet Take 1 tablet by mouth daily.  . Melatonin 5 MG CHEW Chew 5 mg by mouth at bedtime.  . Multiple Vitamins-Minerals (MULTIVITAMIN WITH MINERALS) tablet Take 1 tablet by mouth daily.     Allergies:   Ciprofloxacin and Codeine   Social History   Socioeconomic History  . Marital status: Divorced    Spouse name: Not on file  . Number of children: 2  . Years of education: Not on file  . Highest education level: Master's degree (e.g., MA, MS, MEng, MEd, MSW, MBA)  Occupational History  . Occupation: Retired  Scientific laboratory technician  . Financial resource strain: Not hard at all  . Food insecurity:    Worry: Never true    Inability: Never true  . Transportation needs:    Medical: No    Non-medical: No  Tobacco Use  . Smoking status: Never Smoker  . Smokeless tobacco: Never Used  Substance and Sexual Activity  . Alcohol use: No  . Drug use: No  . Sexual activity: Not on file  Lifestyle  . Physical activity:    Days per week: 7 days    Minutes per session: 60 min  . Stress: Only a little  Relationships  . Social connections:    Talks on phone: More than three times a week    Gets together: More than three times a week    Attends religious service: Never    Active member of club or organization: Yes    Attends meetings of clubs or organizations: 1 to 4 times per year    Relationship status: Divorced  Other Topics Concern  . Not on file  Social History Narrative  . Not on file     Family History: The patient's  family history includes Breast cancer (age of onset: 22) in her mother; CAD in her mother.  ROS:   Please see the history of present illness.    All other systems reviewed and are negative.  EKGs/Labs/Other Studies Reviewed:    The following studies were reviewed today: ***   Recent Labs: 07/16/2018: ALT 22 07/17/2018: Magnesium 1.9 07/19/2018: BUN 7; Creatinine, Ser 0.64; Hemoglobin 9.2; Platelets 169; Potassium 3.8; Sodium 143  Recent Lipid Panel    Component Value Date/Time   CHOL 198 04/12/2018 1430   TRIG 308 (H) 04/12/2018 1430   HDL 47 04/12/2018 1430   CHOLHDL 4.2 04/12/2018 1430   CHOLHDL 4.5 02/28/2018 0345   VLDL 41 (H) 02/28/2018 0345   LDLCALC 89 04/12/2018 1430    Physical Exam:    VS:  BP 116/72 (BP Location: Right Arm, Patient Position: Sitting, Cuff Size: Normal)   Pulse 61   Ht 5\' 6"  (1.676 m)   Wt 266 lb (120.7 kg)   SpO2 96%   BMI 42.93 kg/m     Wt Readings from Last 3 Encounters:  07/25/18 266 lb (120.7 kg)  07/17/18 261 lb 8 oz (118.6 kg)  05/22/18 268 lb (121.6 kg)     GEN: Patient is in no acute distress HEENT: Normal NECK: No JVD; No carotid bruits LYMPHATICS: No lymphadenopathy CARDIAC: Hear sounds regular, 2/6 systolic murmur at the apex. RESPIRATORY:  Clear to auscultation without rales, wheezing or rhonchi  ABDOMEN: Soft, non-tender, non-distended MUSCULOSKELETAL:  No edema; No deformity  SKIN: Warm and dry NEUROLOGIC:  Alert and oriented x 3 PSYCHIATRIC:  Normal affect   Signed, Jenean Lindau, MD  07/25/2018 3:44 PM    [facility]

## 2018-08-10 ENCOUNTER — Other Ambulatory Visit: Payer: Self-pay | Admitting: Gastroenterology

## 2018-08-10 ENCOUNTER — Ambulatory Visit
Admission: RE | Admit: 2018-08-10 | Discharge: 2018-08-10 | Disposition: A | Discharge status: Home / Self Care | Payer: Medicare Other | Source: Ambulatory Visit | Attending: Gastroenterology | Admitting: Gastroenterology

## 2018-08-10 DIAGNOSIS — R0789 Other chest pain: Secondary | ICD-10-CM

## 2018-09-06 ENCOUNTER — Ambulatory Visit: Payer: Medicare Other | Admitting: Podiatry

## 2018-10-18 ENCOUNTER — Ambulatory Visit: Payer: Medicare Other | Admitting: Podiatry

## 2018-11-01 ENCOUNTER — Encounter: Payer: Self-pay | Admitting: Podiatry

## 2018-11-01 ENCOUNTER — Ambulatory Visit (INDEPENDENT_AMBULATORY_CARE_PROVIDER_SITE_OTHER): Payer: Medicare Other

## 2018-11-01 ENCOUNTER — Other Ambulatory Visit: Payer: Self-pay | Admitting: Podiatry

## 2018-11-01 ENCOUNTER — Other Ambulatory Visit: Payer: Self-pay

## 2018-11-01 ENCOUNTER — Ambulatory Visit (INDEPENDENT_AMBULATORY_CARE_PROVIDER_SITE_OTHER): Payer: Medicare Other | Admitting: Podiatry

## 2018-11-01 VITALS — BP 140/67 | HR 60 | Temp 97.3°F | Resp 16

## 2018-11-01 DIAGNOSIS — M779 Enthesopathy, unspecified: Secondary | ICD-10-CM

## 2018-11-01 DIAGNOSIS — B351 Tinea unguium: Secondary | ICD-10-CM

## 2018-11-01 DIAGNOSIS — M79672 Pain in left foot: Secondary | ICD-10-CM

## 2018-11-01 DIAGNOSIS — M79671 Pain in right foot: Secondary | ICD-10-CM

## 2018-11-01 DIAGNOSIS — M21619 Bunion of unspecified foot: Secondary | ICD-10-CM | POA: Diagnosis not present

## 2018-11-01 MED ORDER — TRIAMCINOLONE ACETONIDE 10 MG/ML IJ SUSP
10.0000 mg | Freq: Once | INTRAMUSCULAR | Status: AC
  Administered 2018-11-01: 10 mg

## 2018-11-01 NOTE — Progress Notes (Signed)
   Subjective:    Patient ID: Brittany Middleton, female    DOB: [birth year], 68 y.o.   MRN: [id number]  HPI    Review of Systems  All other systems reviewed and are negative.      Objective:   Physical Exam        Assessment & Plan:

## 2018-11-01 NOTE — Progress Notes (Signed)
Subjective:   Patient ID: Brittany Middleton, female   DOB: 68 y.o.   MRN: [id number]   HPI Patient presents with pain in the outside of both feet stating that this is been going on now for a while and that the patient knows that she walks abnormally and has had second digit surgery left in the past.  Patient does walk 10,000 steps per day and does not smoke   Review of Systems  All other systems reviewed and are negative.       Objective:  Physical Exam Vitals signs and nursing note reviewed.  Constitutional:      Appearance: She is well-developed.  Pulmonary:     Effort: Pulmonary effort is normal.  Musculoskeletal: Normal range of motion.  Skin:    General: Skin is warm.  Neurological:     Mental Status: She is alert.     Neurovascular status intact muscle strength was found to be adequate range of motion within normal limits.  Patient does have moderate varus rotation of the calcaneus bilateral does walk on the outside of her feet and has inflammation base of fifth metatarsal at the peroneal insertion bilateral     Assessment:  Acute peroneal tendinitis bilateral secondary to foot structure and patient who does a lot of walking     Plan:  H&P x-rays reviewed and today careful steroid injections administered lateral side fifth metatarsal bilateral.  Discussed possibility for orthotics and explained valgus wedging which we would do and we will see the response to injections and decide what else may be appropriate for this patient  X-rays are negative for signs of bony enlargement around the base of the fifth metatarsal or other pathology

## 2018-11-01 NOTE — Patient Instructions (Signed)
Bunion  A bunion is a bump on the base of the big toe that forms when the bones of the big toe joint move out of position. Bunions may be small at first, but they often get larger over time. They can make walking painful. What are the causes? A bunion may be caused by:  Wearing narrow or pointed shoes that force the big toe to press against the other toes.  Abnormal foot development that causes the foot to roll inward (pronate).  Changes in the foot that are caused by certain diseases, such as rheumatoid arthritis or polio.  A foot injury. What increases the risk? The following factors may make you more likely to develop this condition:  Wearing shoes that squeeze the toes together.  Having certain diseases, such as: ? Rheumatoid arthritis. ? Polio. ? Cerebral palsy.  Having family members who have bunions.  Being born with a foot deformity, such as flat feet or low arches.  Doing activities that put a lot of pressure on the feet, such as ballet dancing. What are the signs or symptoms? The main symptom of a bunion is a noticeable bump on the big toe. Other symptoms may include:  Pain.  Swelling around the big toe.  Redness and inflammation.  Thick or hardened skin on the big toe or between the toes.  Stiffness or loss of motion in the big toe.  Trouble with walking. How is this diagnosed? A bunion may be diagnosed based on your symptoms, medical history, and activities. You may have tests, such as:  X-rays. These allow your health care provider to check the position of the bones in your foot and look for damage to your joint. They also help your health care provider determine the severity of your bunion and the best way to treat it.  Joint aspiration. In this test, a sample of fluid is removed from the toe joint. This test may be done if you are in a lot of pain. It helps rule out diseases that cause painful swelling of the joints, such as arthritis. How is this  treated? Treatment depends on the severity of your symptoms. The goal of treatment is to relieve symptoms and prevent the bunion from getting worse. Your health care provider may recommend:  Wearing shoes that have a wide toe box.  Using bunion pads to cushion the affected area.  Taping your toes together to keep them in a normal position.  Placing a device inside your shoe (orthotics) to help reduce pressure on your toe joint.  Taking medicine to ease pain, inflammation, and swelling.  Applying heat or ice to the affected area.  Doing stretching exercises.  Surgery to remove scar tissue and move the toes back into their normal position. This treatment is rare. Follow these instructions at home: Managing pain, stiffness, and swelling   If directed, put ice on the painful area: ? Put ice in a plastic bag. ? Place a towel between your skin and the bag. ? Leave the ice on for 20 minutes, 2-3 times a day. Activity   If directed, apply heat to the affected area before you exercise. Use the heat source that your health care provider recommends, such as a moist heat pack or a heating pad. ? Place a towel between your skin and the heat source. ? Leave the heat on for 20-30 minutes. ? Remove the heat if your skin turns bright red. This is especially important if you are unable to feel pain,   heat, or cold. You may have a greater risk of getting burned.  Do exercises as told by your health care provider. General instructions  Support your toe joint with proper footwear, shoe padding, or taping as told by your health care provider.  Take over-the-counter and prescription medicines only as told by your health care provider.  Keep all follow-up visits as told by your health care provider. This is important. Contact a health care provider if your symptoms:  Get worse.  Do not improve in 2 weeks. Get help right away if you have:  Severe pain and trouble with walking. Summary  A  bunion is a bump on the base of the big toe that forms when the bones of the big toe joint move out of position.  Bunions can make walking painful.  Treatment depends on the severity of your symptoms.  Support your toe joint with proper footwear, shoe padding, or taping as told by your health care provider. This information is not intended to replace advice given to you by your health care provider. Make sure you discuss any questions you have with your health care provider. Document Released: 06/07/2005 Document Revised: 10/18/2017 Document Reviewed: 10/18/2017 Elsevier Interactive Patient Education  2019 [employer]  

## 2019-04-05 ENCOUNTER — Encounter (HOSPITAL_COMMUNITY): Payer: Self-pay

## 2019-04-05 ENCOUNTER — Emergency Department (HOSPITAL_COMMUNITY)
Admission: EM | Admit: 2019-04-05 | Discharge: 2019-04-05 | Disposition: A | Discharge status: Home / Self Care | Payer: Medicare Other | Attending: Emergency Medicine | Admitting: Emergency Medicine

## 2019-04-05 ENCOUNTER — Other Ambulatory Visit: Payer: Self-pay

## 2019-04-05 ENCOUNTER — Emergency Department (HOSPITAL_COMMUNITY): Payer: Medicare Other

## 2019-04-05 DIAGNOSIS — R0789 Other chest pain: Secondary | ICD-10-CM

## 2019-04-05 DIAGNOSIS — Z20828 Contact with and (suspected) exposure to other viral communicable diseases: Secondary | ICD-10-CM | POA: Insufficient documentation

## 2019-04-05 DIAGNOSIS — E669 Obesity, unspecified: Secondary | ICD-10-CM | POA: Insufficient documentation

## 2019-04-05 DIAGNOSIS — Z7901 Long term (current) use of anticoagulants: Secondary | ICD-10-CM | POA: Diagnosis not present

## 2019-04-05 DIAGNOSIS — Z6841 Body Mass Index (BMI) 40.0 and over, adult: Secondary | ICD-10-CM | POA: Diagnosis not present

## 2019-04-05 DIAGNOSIS — R0602 Shortness of breath: Secondary | ICD-10-CM | POA: Diagnosis not present

## 2019-04-05 DIAGNOSIS — I10 Essential (primary) hypertension: Secondary | ICD-10-CM | POA: Insufficient documentation

## 2019-04-05 LAB — BASIC METABOLIC PANEL
Anion gap: 12 (ref 5–15)
BUN: 11 mg/dL (ref 8–23)
CO2: 28 mmol/L (ref 22–32)
Calcium: 9.5 mg/dL (ref 8.9–10.3)
Chloride: 103 mmol/L (ref 98–111)
Creatinine, Ser: 0.87 mg/dL (ref 0.44–1.00)
GFR calc Af Amer: >60 mL/min (ref >60)
GFR calc non Af Amer: >60 mL/min (ref >60)
Glucose, Bld: 105 mg/dL — ABNORMAL HIGH (ref 70–99)
Potassium: 4.2 mmol/L (ref 3.5–5.1)
Sodium: 143 mmol/L (ref 135–145)

## 2019-04-05 LAB — TROPONIN I (HIGH SENSITIVITY)
Troponin I (High Sensitivity): 3 ng/L (ref <18)
Troponin I (High Sensitivity): 4 ng/L (ref <18)

## 2019-04-05 LAB — CBC
HCT: 44.8 % (ref 36.0–46.0)
Hemoglobin: 14.6 g/dL (ref 12.0–15.0)
MCH: 30.7 pg (ref 26.0–34.0)
MCHC: 32.6 g/dL (ref 30.0–36.0)
MCV: 94.1 fL (ref 80.0–100.0)
Platelets: 244 10*3/uL (ref 150–400)
RBC: 4.76 MIL/uL (ref 3.87–5.11)
RDW: 13.3 % (ref 11.5–15.5)
WBC: 4.7 10*3/uL (ref 4.0–10.5)
nRBC: 0 % (ref 0.0–0.2)

## 2019-04-05 LAB — D-DIMER, QUANTITATIVE: D-Dimer, Quant: <0.27 ug/mL-FEU (ref 0.00–0.50)

## 2019-04-05 MED ORDER — SODIUM CHLORIDE 0.9% FLUSH: 3.0000 mL | Freq: Once | INTRAVENOUS | Status: DC

## 2019-04-05 MED ORDER — ASPIRIN 81 MG PO CHEW
324.0000 mg | CHEWABLE_TABLET | Freq: Once | ORAL | Status: AC
  Administered 2019-04-05: 324 mg via ORAL
  Filled 2019-04-05: qty 4

## 2019-04-05 NOTE — ED Triage Notes (Signed)
Pt presents with mid-sternum CP and SOB x10 days, covid test on Monday still waiting results.

## 2019-04-05 NOTE — Discharge Instructions (Signed)
Please continue to quarantine at home until you receive your COVID-19 test and it is negative.  If positive, you can return back to normal activities when your symptoms have resolved for 3 days.  Please follow-up with your doctor for recheck and further evaluation, especially if you are negative for COVID-19 and your symptoms are continuing.  Please return to the emergency department if you develop any worsening chest pain or shortness of breath.

## 2019-04-05 NOTE — ED Notes (Signed)
Patient verbalizes understanding of discharge instructions. Opportunity for questioning and answers were provided. Armband removed by staff, pt discharged from ED.  

## 2019-04-05 NOTE — ED Provider Notes (Signed)
[facility] EMERGENCY DEPARTMENT Provider Note   CSN: [id number] Arrival date & time: 04/05/19  1408     History   Chief Complaint Chief Complaint  Patient presents with  . Chest Pain  . Shortness of Breath    HPI Brittany Middleton is a 68 y.o. female with history of hypertension, hyperlipidemia, coronary aneurysm who presents with a 10 day history of chest tightness and shortness of breath. Her symptoms are pretty much constant.  She describes she is aware of taking a deep breath.  She has still been able to do her daily activities.  She denies any fever, cough, loss of taste or smell, abdominal pain, nausea, vomiting.  Patient denies any heavy lifting prior to onset of symptoms. She has tried an OTC cold and flu medication. She also denies and recent long trips, surgeries, known cancer, hx of VTE, new leg pain or swelling, or exogenous estrogen use. Patient had a COVID-19 test at [facility] on 04/02/2019. Results have not been released to her yet.     HPI  Past Medical History:  Diagnosis Date  . Cervical disc disease    MRI 2016  . Hepatic steatosis    per imaging 2016, 2019  . Hiatal hernia   . History of palpitations    Holter monitor, 2017: NSR, occasional PVC, PACs, no arrhythmia  . Hypercholesteremia   . Hypertension   . Lumbar disc disease    MRI, 2017     Patient Active Problem List   Diagnosis Date Noted  . Gastrointestinal bleeding 07/25/2018  . Hiatal hernia   . Anemia 07/17/2018  . Acute blood loss anemia 07/17/2018  . Hypokalemia 07/17/2018  . Gastrointestinal hemorrhage with melena 07/16/2018  . Morbid obesity ([place]) 05/22/2018  . Overweight 03/03/2018  . 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 Chest pain with moderate risk for cardiac etiology 02/27/2018  . HTN (hypertension) 02/27/2018  . HLD (hyperlipidemia) 02/27/2018  . Hypercholesterolemia 04/07/2017  . Hypertensive disorder 04/07/2017  . Palpitations 08/19/2015    Past  Surgical History:  Procedure Laterality Date  . BLADDER REPAIR    . BREAST EXCISIONAL BIOPSY Left   . CARPAL TUNNEL RELEASE Right   . CATARACT EXTRACTION, BILATERAL  2014  . COLONOSCOPY WITH PROPOFOL N/A 07/18/2018   Procedure: COLONOSCOPY WITH PROPOFOL;  Surgeon: Laurence Spates, MD;  Location: [facility];  Service: Endoscopy;  Laterality: N/A;  . ESOPHAGOGASTRODUODENOSCOPY (EGD) WITH PROPOFOL N/A 07/17/2018   Procedure: ESOPHAGOGASTRODUODENOSCOPY (EGD) WITH PROPOFOL;  Surgeon: Laurence Spates, MD;  Location: [facility];  Service: Endoscopy;  Laterality: N/A;  . REPAIR RECTOCELE       OB History   No obstetric history on file.      Home Medications    Prior to Admission medications   Medication Sig Start Date End Date Taking? Authorizing Provider  acetaminophen (TYLENOL) 500 MG tablet Take 1,000 mg by mouth every 6 (six) hours as needed for mild pain, moderate pain, fever or headache.    [provider]  cholecalciferol (VITAMIN D) 1000 UNITS tablet Take 1,000 Units by mouth daily.    [provider]  clopidogrel (PLAVIX) 75 MG tablet Take 75 mg by mouth daily.    [provider]  Coenzyme Q10 (CO Q 10 PO) Take 1 tablet by mouth daily.    [provider]  IRON PO Take 1 tablet by mouth every other day.    [provider]  Javier Docker Oil 300 MG CAPS Take 1 capsule  by mouth daily.    [provider]  lisinopril-hydrochlorothiazide (PRINZIDE,ZESTORETIC) 20-25 MG per tablet Take 1 tablet by mouth daily.    [provider]  Melatonin 5 MG CHEW Chew 5 mg by mouth at bedtime.    [provider]  Multiple Vitamins-Minerals (MULTIVITAMIN WITH MINERALS) tablet Take 1 tablet by mouth daily.    [provider]  rosuvastatin (CRESTOR) 10 MG tablet Take 1 tablet (10 mg total) by mouth daily. 03/03/18 07/16/18  Revankar, [name], MD    Family History Family History  Problem Relation Age of Onset  . CAD Mother   .  Breast cancer Mother 21    Social History Social History   Tobacco Use  . Smoking status: Never Smoker  . Smokeless tobacco: Never Used  Substance Use Topics  . Alcohol use: No  . Drug use: No     Allergies   Ciprofloxacin and Codeine   Review of Systems Review of Systems  Constitutional: Negative for chills and fever.  HENT: Negative for facial swelling and sore throat.   Respiratory: Positive for chest tightness and shortness of breath. Negative for cough.   Cardiovascular: Positive for chest pain.  Gastrointestinal: Negative for abdominal pain, nausea and vomiting.  Genitourinary: Negative for dysuria.  Musculoskeletal: Negative for back pain.  Skin: Negative for rash and wound.  Neurological: Negative for headaches.  Psychiatric/Behavioral: The patient is not nervous/anxious.      Physical Exam Updated Vital Signs BP 139/61   Pulse (!) 55   Temp 98.5 F (36.9 C) (Oral)   Resp 15   Ht 5\' 5"  (1.651 m)   Wt 120.2 kg   SpO2 98%   BMI 44.10 kg/m   Physical Exam Vitals signs and nursing note reviewed.  Constitutional:      General: She is not in acute distress.    Appearance: She is well-developed. She is obese. She is not diaphoretic.  HENT:     Head: Normocephalic and atraumatic.     Mouth/Throat:     Pharynx: No oropharyngeal exudate.  Eyes:     General: No scleral icterus.       Right eye: No discharge.        Left eye: No discharge.     Conjunctiva/sclera: Conjunctivae normal.     Pupils: Pupils are equal, round, and reactive to light.  Neck:     Musculoskeletal: Normal range of motion and neck supple.     Thyroid: No thyromegaly.  Cardiovascular:     Rate and Rhythm: Normal rate and regular rhythm.     Heart sounds: Normal heart sounds. No murmur. No friction rub. No gallop.   Pulmonary:     Effort: Pulmonary effort is normal. No respiratory distress.     Breath sounds: Normal breath sounds. No stridor. No wheezing or rales.  Chest:      Chest wall: Tenderness present.  Abdominal:     General: Bowel sounds are normal. There is no distension.     Palpations: Abdomen is soft.     Tenderness: There is no abdominal tenderness. There is no guarding or rebound.  Lymphadenopathy:     Cervical: No cervical adenopathy.  Skin:    General: Skin is warm and dry.     Coloration: Skin is not pale.     Findings: No rash.  Neurological:     Mental Status: She is alert.     Coordination: Coordination normal.      ED Treatments / Results  Labs (all labs ordered are listed, but only abnormal results are displayed) Labs Reviewed  BASIC METABOLIC PANEL - Abnormal; Notable for the following components:      Result Value   Glucose, Bld 105 (*)    All other components within normal limits  NOVEL CORONAVIRUS, NAA (HOSP ORDER, SEND-OUT TO REF LAB; TAT 18-24 HRS)  CBC  D-DIMER, QUANTITATIVE (NOT AT [facility])  TROPONIN I (HIGH SENSITIVITY)  TROPONIN I (HIGH SENSITIVITY)    EKG EKG Interpretation  Date/Time:  Thursday April 05 2019 14:14:58 EDT Ventricular Rate:  64 PR Interval:  142 QRS Duration: 80 QT Interval:  410 QTC Calculation: 422 R Axis:   96 Text Interpretation:  Normal sinus rhythm Rightward axis Low voltage QRS Nonspecific ST abnormality Abnormal ECG Confirmed by Quintella Reichert [id number]) on 04/05/2019 4:13:33 PM   Radiology Dg Chest 2 View  Result Date: 04/05/2019 CLINICAL DATA:  Pt c/o central chest pain and SOB x 10 days. Hx of HTN. Pt is a nonsmoker. EXAM: CHEST - 2 VIEW COMPARISON:  02/27/2018 FINDINGS: Heart size is normal. Lungs are free of focal consolidations and pleural effusions. Thoracic degenerative changes are present. IMPRESSION: No active cardiopulmonary disease. Electronically Signed   By: Nolon Nations M.D.   On: 04/05/2019 14:43    Procedures Procedures (including critical care time)  Medications Ordered in ED Medications  sodium chloride flush (NS) 0.9 % injection 3 mL (0 mLs Intravenous  Hold 04/05/19 1753)  aspirin chewable tablet 324 mg (324 mg Oral Given 04/05/19 1758)     Initial Impression / Assessment and Plan / ED Course  I have reviewed the triage vital signs and the nursing notes.  Pertinent labs & imaging results that were available during my care of the patient were reviewed by me and considered in my medical decision making (see chart for details).        Patient presented with a 10-day history of chest pain and shortness of breath.  She more describes her chest pain as a tightness and being more aware when she is taking a deep breath.  Her CBC, BMP, D-dimer, and 2 troponins are negative here.  Chest x-ray is clear.  EKG shows NSR.  Very low suspicion of ACS.  Possible COVID-19.  Will retest considering patient has not heard her results and she had a self swab.  Close follow-up with PCP if symptoms not improving.  Quarantine advised.  Patient given return precautions.  Patient understands and agrees with plan.  Patient vitals stable throughout ED course and discharged in satisfactory condition. I discussed patient case with Dr. Ralene Bathe who guided the patient's management and agrees with plan.  Brittany Middleton was evaluated in Emergency Department on 04/05/2019 for the symptoms described in the history of present illness. She was evaluated in the context of the global COVID-19 pandemic, which necessitated consideration that the patient might be at risk for infection with the SARS-CoV-2 virus that causes COVID-19. Institutional protocols and algorithms that pertain to the evaluation of patients at risk for COVID-19 are in a state of rapid change based on information released by regulatory bodies including the CDC and federal and state organizations. These policies and algorithms were followed during the patient's care in the ED.  Final Clinical Impressions(s) / ED Diagnoses   Final diagnoses:  Shortness of breath  Chest tightness    ED Discharge Orders    None        Frederica Kuster, Hershal Coria 04/05/19 1923  Quintella Reichert, MD 04/05/19 2234

## 2019-04-08 LAB — NOVEL CORONAVIRUS, NAA (HOSP ORDER, SEND-OUT TO REF LAB; TAT 18-24 HRS): SARS-CoV-2, NAA: NOT DETECTED

## 2019-05-23 ENCOUNTER — Other Ambulatory Visit: Payer: Self-pay

## 2019-05-23 ENCOUNTER — Ambulatory Visit (INDEPENDENT_AMBULATORY_CARE_PROVIDER_SITE_OTHER): Payer: Medicare Other | Admitting: Cardiology

## 2019-05-23 ENCOUNTER — Encounter: Payer: Self-pay | Admitting: Cardiology

## 2019-05-23 VITALS — BP 146/64 | HR 70 | Ht 65.0 in | Wt 265.0 lb

## 2019-05-23 DIAGNOSIS — E782 Mixed hyperlipidemia: Secondary | ICD-10-CM | POA: Diagnosis not present

## 2019-05-23 DIAGNOSIS — I7121 Aneurysm of the ascending aorta, without rupture: Secondary | ICD-10-CM

## 2019-05-23 DIAGNOSIS — I2541 Coronary artery aneurysm: Secondary | ICD-10-CM

## 2019-05-23 DIAGNOSIS — Z13220 Encounter for screening for lipoid disorders: Secondary | ICD-10-CM

## 2019-05-23 DIAGNOSIS — I712 Thoracic aortic aneurysm, without rupture: Secondary | ICD-10-CM | POA: Diagnosis not present

## 2019-05-23 DIAGNOSIS — I10 Essential (primary) hypertension: Secondary | ICD-10-CM

## 2019-05-23 HISTORY — DX: Thoracic aortic aneurysm, without rupture: I71.2

## 2019-05-23 HISTORY — DX: Aneurysm of the ascending aorta, without rupture: I71.21

## 2019-05-23 MED ORDER — METOPROLOL TARTRATE 50 MG PO TABS: 100.0000 mg | ORAL_TABLET | Freq: Once | ORAL | 0 refills | Status: DC

## 2019-05-23 NOTE — Progress Notes (Signed)
Cardiology Office Note:    Date:  05/23/2019   ID:  [name], DOB 04/22/1951, MRN [id number]  PCP:  Maurice Small, MD  Cardiologist:  Jenean Lindau, MD   Referring MD: Maurice Small, MD    ASSESSMENT:    1. Ascending aortic aneurysm (HCC)   2. Screening cholesterol level   3. Coronary aneurysm   4. Mixed hyperlipidemia   5. Essential hypertension   6. Morbid obesity ([place])    PLAN:    In order of problems listed above:  1. Coronary artery aneurysm: Stable and asymptomatic.  At this time no need for intervention.  Patient is receiving Plavix and is on statin therapy.  Blood pressure is stable.  I would like to document here that that is no aortic aneurysm and his patient.  She has coronary artery aneurysm and by the previous CT scan report we will set her up for a repeat coronary artery angiography to assess this aneurysm as recommended.  I discussed this with the patient at extensive length and she understands and agreeable. 2. Obesity and mixed dyslipidemia: Discussed diet and exercise with the patient extensively and she vocalized understanding.  Questions were answered to her satisfaction.  3. She is fasting today and will have blood work including lipids 4. Patient will be seen in follow-up appointment in 6 months or earlier if the patient has any concerns    Medication Adjustments/Labs and Tests Ordered: Current medicines are reviewed at length with the patient today.  Concerns regarding medicines are outlined above.  Orders Placed This Encounter  Procedures   CT CORONARY MORPH W/CTA COR W/SCORE W/CA W/CM &/OR WO/CM   CT CORONARY FRACTIONAL FLOW RESERVE DATA PREP   CT CORONARY FRACTIONAL FLOW RESERVE FLUID ANALYSIS   Basic Metabolic Panel (BMET)   Lipid Profile   Hepatic function panel   Meds ordered this encounter  Medications   metoprolol tartrate (LOPRESSOR) 50 MG tablet    Sig: Take 2 tablets (100 mg total) by mouth once for 1 dose. TAKE 2  Tablets 2 hours prior to procedure if HR is 55 or greater    Dispense:  2 tablet    Refill:  0     No chief complaint on file.    History of Present Illness:    Shametria Russi is a 68 y.o. female.  Patient has past medical history of coronary artery aneurysm, ascending aortic aneurysm, morbid obesity dyslipidemia.  She denies any problems at this time and tells that she has been lax with diet but with exercise program.  No chest pain orthopnea or PND.  She prefers to take Plavix instead of the aspirin and is tolerating it well.  At the time of my evaluation, the patient is alert awake oriented and in no distress.  Past Medical History:  Diagnosis Date   Cervical disc disease    MRI 2016   Hepatic steatosis    per imaging 2016, 2019   Hiatal hernia    History of palpitations    Holter monitor, 2017: NSR, occasional PVC, PACs, no arrhythmia   Hypercholesteremia    Hypertension    Lumbar disc disease    MRI, 2017     Past Surgical History:  Procedure Laterality Date   BLADDER REPAIR     BREAST EXCISIONAL BIOPSY Left    CARPAL TUNNEL RELEASE Right    CATARACT EXTRACTION, BILATERAL  2014   COLONOSCOPY WITH PROPOFOL N/A 07/18/2018   Procedure: COLONOSCOPY WITH PROPOFOL;  Surgeon: Laurence Spates, MD;  Location: [facility] ENDOSCOPY;  Service: Endoscopy;  Laterality: N/A;   ESOPHAGOGASTRODUODENOSCOPY (EGD) WITH PROPOFOL N/A 07/17/2018   Procedure: ESOPHAGOGASTRODUODENOSCOPY (EGD) WITH PROPOFOL;  Surgeon: Laurence Spates, MD;  Location: [place];  Service: Endoscopy;  Laterality: N/A;   REPAIR RECTOCELE      Current Medications: Current Meds  Medication Sig   acetaminophen (TYLENOL) 500 MG tablet Take 1,000 mg by mouth every 6 (six) hours as needed for mild pain, moderate pain, fever or headache.   cholecalciferol (VITAMIN D) 1000 UNITS tablet Take 1,000 Units by mouth daily.   clopidogrel (PLAVIX) 75 MG tablet Take 75 mg by mouth daily.   Coenzyme Q10 (CO Q 10  PO) Take 1 tablet by mouth daily.   ELDERBERRY PO Take 125 mg by mouth.   IRON PO Take 1 tablet by mouth every other day.   Krill Oil 300 MG CAPS Take 1 capsule by mouth daily.   lisinopril-hydrochlorothiazide (PRINZIDE,ZESTORETIC) 20-25 MG per tablet Take 1 tablet by mouth daily.   Melatonin 5 MG CHEW Chew 5 mg by mouth at bedtime.   Multiple Vitamins-Minerals (MULTIVITAMIN WITH MINERALS) tablet Take 1 tablet by mouth daily.     Allergies:   Ciprofloxacin and Codeine   Social History   Socioeconomic History   Marital status: Divorced    Spouse name: Not on file   Number of children: 2   Years of education: Not on file   Highest education level: Master's degree (e.g., MA, MS, MEng, MEd, MSW, MBA)  Occupational History   Occupation: Retired  Scientist, product/process development strain: Not hard at [employer]    Worry: Never true    Inability: Never true   Transportation needs    Medical: No    Non-medical: No  Tobacco Use   Smoking status: Never Smoker   Smokeless tobacco: Never Used  Substance and Sexual Activity   Alcohol use: No   Drug use: No   Sexual activity: Not on file  Lifestyle   Physical activity    Days per week: 7 days    Minutes per session: 60 min   Stress: Only a little  Relationships   Social connections    Talks on phone: More than three times a week    Gets together: More than three times a week    Attends religious service: Never    Active member of club or organization: Yes    Attends meetings of clubs or organizations: 1 to 4 times per year    Relationship status: Divorced  Other Topics Concern   Not on file  Social History Narrative   Not on file     Family History: The patient's family history includes Breast cancer (age of onset: 97) in her mother; CAD in her mother.  ROS:   Please see the history of present illness.    All other systems reviewed and are negative.  EKGs/Labs/Other Studies Reviewed:      The following studies were reviewed today: Study Conclusions  - Left ventricle: The cavity size was normal. Systolic function was   normal. The estimated ejection fraction was in the range of 55%   to 60%. Although no diagnostic regional wall motion abnormality   was identified, this possibility cannot be completely excluded on   the basis of this study. Doppler parameters are consistent with   abnormal left ventricular relaxation (grade 1 diastolic   dysfunction). - Mitral valve: Valve area by  pressure half-time: 2.32 cm^2. - Left atrium: The atrium was mildly dilated.    Recent Labs: 07/16/2018: ALT 22 07/17/2018: Magnesium 1.9 04/05/2019: BUN 11; Creatinine, Ser 0.87; Hemoglobin 14.6; Platelets 244; Potassium 4.2; Sodium 143  Recent Lipid Panel    Component Value Date/Time   CHOL 198 04/12/2018 1430   TRIG 308 (H) 04/12/2018 1430   HDL 47 04/12/2018 1430   CHOLHDL 4.2 04/12/2018 1430   CHOLHDL 4.5 02/28/2018 0345   VLDL 41 (H) 02/28/2018 0345   LDLCALC 89 04/12/2018 1430    Physical Exam:    VS:  BP (!) 146/64 (BP Location: Left Arm, Patient Position: Sitting, Cuff Size: Normal)    Pulse 70    Ht 5\' 5"  (1.651 m)    Wt 265 lb (120.2 kg)    SpO2 97%    BMI 44.10 kg/m     Wt Readings from Last 3 Encounters:  05/23/19 265 lb (120.2 kg)  04/05/19 265 lb (120.2 kg)  07/25/18 266 lb (120.7 kg)     GEN: Patient is in no acute distress HEENT: Normal NECK: No JVD; No carotid bruits LYMPHATICS: No lymphadenopathy CARDIAC: Hear sounds regular, 2/6 systolic murmur at the apex. RESPIRATORY:  Clear to auscultation without rales, wheezing or rhonchi  ABDOMEN: Soft, non-tender, non-distended MUSCULOSKELETAL:  No edema; No deformity  SKIN: Warm and dry NEUROLOGIC:  Alert and oriented x 3 PSYCHIATRIC:  Normal affect   Signed, Jenean Lindau, MD  05/23/2019 10:22 AM    [facility]

## 2019-05-23 NOTE — Patient Instructions (Addendum)
Medication Instructions: Your physician recommends that you continue on your current medications as directed. Please refer to the Current Medication list given to you today.  *If you need a refill on your cardiac medications before your next appointment, please call your pharmacy*  Lab Work: Your physician recommends that you have a BMP, hepatic and lipid drawn 7 days   If you have labs (blood work) drawn today and your tests are completely normal, you will receive your results only by: Marland Kitchen MyChart Message (if you have MyChart) OR . A paper copy in the mail If you have any lab test that is abnormal or we need to change your treatment, we will call you to review the results.  Testing/Procedures: Non-Cardiac CT scanning, (CAT scanning), is a noninvasive, special x-ray that produces cross-sectional images of the body using x-rays and a computer. CT scans help physicians diagnose and treat medical conditions. For some CT exams, a contrast material is used to enhance visibility in the area of the body being studied. CT scans provide greater clarity and reveal more details than regular x-ray exams.  Please arrive at the [facility] main entrance of [facility] at xx:xx AM (30-45 minutes prior to test start time)  [facility] [place], [place] [address] [phone]  Proceed to the [facility] Radiology Department (First Floor).  Please follow these instructions carefully (unless otherwise directed):  On the Night Before the Test: . Be sure to Drink plenty of water. . Do not consume any caffeinated/decaffeinated beverages or chocolate 12 hours prior to your test. . Do not take any antihistamines 12 hours prior to your test.  On the Day of the Test: . Drink plenty of water. Do not drink any water within one hour of the test. . Do not eat any food 4 hours prior to the test. . You may take your regular medications prior to the test.  . Take metoprolol  (Lopressor) two hours prior to test if HR is 55 or greater . HOLD Lisinopril-Hydrochlorothiazide morning of the test.      After the Test: . Drink plenty of water. . After receiving IV contrast, you may experience a mild flushed feeling. This is normal. . On occasion, you may experience a mild rash up to 24 hours after the test. This is not dangerous. If this occurs, you can take Benadryl 25 mg and increase your fluid intake. . If you experience trouble breathing, this can be serious. If it is severe call 911 IMMEDIATELY. If it is mild, please call our office. . If you take any of these medications: Glipizide/Metformin, Avandament, Glucavance, please do not take 48 hours after completing test.   Follow-Up: At [facility], you and your health needs are our priority.  As part of our continuing mission to provide you with exceptional heart care, we have created designated Provider Care Teams.  These Care Teams include your primary Cardiologist (physician) and Advanced Practice Providers (APPs -  Physician Assistants and Nurse Practitioners) who all work together to provide you with the care you need, when you need it. You will need a follow up appointment in 6 months.    Any Other Special Instructions Will Be Listed Below  Metoprolol tablets What is this medicine? METOPROLOL (me TOE proe lole) is a beta-blocker. Beta-blockers reduce the workload on the heart and help it to beat more regularly. This medicine is used to treat high blood pressure and to prevent chest pain. It is  also used to after a heart attack and to prevent an additional heart attack from occurring. This medicine may be used for other purposes; ask your health care provider or pharmacist if you have questions. COMMON BRAND NAME(S): Lopressor What should I tell my health care provider before I take this medicine? They need to know if you have any of these conditions: -diabetes -heart or vessel disease like slow heart rate,  worsening heart failure, heart block, sick sinus syndrome or Raynaud's disease -kidney disease -liver disease -lung or breathing disease, like asthma or emphysema -pheochromocytoma -thyroid disease -an unusual or allergic reaction to metoprolol, other beta-blockers, medicines, foods, dyes, or preservatives -pregnant or trying to get pregnant -breast-feeding How should I use this medicine? Take this medicine by mouth with a drink of water. Follow the directions on the prescription label. Take this medicine immediately after meals. Take your doses at regular intervals. Do not take more medicine than directed. Do not stop taking this medicine suddenly. This could lead to serious heart-related effects. Talk to your pediatrician regarding the use of this medicine in children. Special care may be needed. Overdosage: If you think you have taken too much of this medicine contact a poison control center or emergency room at once. NOTE: This medicine is only for you. Do not share this medicine with others. What if I miss a dose? If you miss a dose, take it as soon as you can. If it is almost time for your next dose, take only that dose. Do not take double or extra doses. What may interact with this medicine? This medicine may interact with the following medications: -certain medicines for blood pressure, heart disease, irregular heart beat -certain medicines for depression like monoamine oxidase (MAO) inhibitors, fluoxetine, or paroxetine -clonidine -dobutamine -epinephrine -isoproterenol -reserpine This list may not describe all possible interactions. Give your health care provider a list of all the medicines, herbs, non-prescription drugs, or dietary supplements you use. Also tell them if you smoke, drink alcohol, or use illegal drugs. Some items may interact with your medicine. What should I watch for while using this medicine? Visit your doctor or health care professional for regular check ups.  Contact your doctor right away if your symptoms worsen. Check your blood pressure and pulse rate regularly. Ask your health care professional what your blood pressure and pulse rate should be, and when you should contact them. You may get drowsy or dizzy. Do not drive, use machinery, or do anything that needs mental alertness until you know how this medicine affects you. Do not sit or stand up quickly, especially if you are an older patient. This reduces the risk of dizzy or fainting spells. Contact your doctor if these symptoms continue. Alcohol may interfere with the effect of this medicine. Avoid alcoholic drinks. What side effects may I notice from receiving this medicine? Side effects that you should report to your doctor or health care professional as soon as possible: -allergic reactions like skin rash, itching or hives -cold or numb hands or feet -depression -difficulty breathing -faint -fever with sore throat -irregular heartbeat, chest pain -rapid weight gain -swollen legs or ankles Side effects that usually do not require medical attention (report to your doctor or health care professional if they continue or are bothersome): -anxiety or nervousness -change in sex drive or performance -dry skin -headache -nightmares or trouble sleeping -short term memory loss -stomach upset or diarrhea -unusually tired This list may not describe all possible side effects. Call your  doctor for medical advice about side effects. You may report side effects to FDA at 1-800-FDA-1088. Where should I keep my medicine? Keep out of the reach of children. Store at room temperature between 15 and 30 degrees C (59 and 86 degrees F). Throw away any unused medicine after the expiration date. NOTE: This sheet is a summary. It may not cover all possible information. If you have questions about this medicine, talk to your doctor, pharmacist, or health care provider.  2019 Elsevier/Gold Standard (2013-02-09  14:40:36)   Coronary Angiogram A coronary angiogram is an X-ray procedure that is used to examine the arteries in the heart. In this procedure, a dye (contrast dye) is injected through a long, thin tube (catheter). The catheter is inserted through the groin, wrist, or arm. The dye is injected into each artery, then X-rays are taken to show if there is a blockage in the arteries of the heart. This procedure can also show if you have valve disease or a disease of the aorta, and it can be used to check the overall function of your heart muscle. You may have a coronary angiogram if:  You are having chest pain, or other symptoms of angina, and you are at risk for heart disease.  You have an abnormal electrocardiogram (ECG) or stress test.  You have chest pain and heart failure.  You are having irregular heart rhythms.  You and your health care provider determine that the benefits of the test information outweigh the risks of the procedure. Let your health care provider know about:  Any allergies you have, including allergies to contrast dye.  All medicines you are taking, including vitamins, herbs, eye drops, creams, and over-the-counter medicines.  Any problems you or family members have had with anesthetic medicines.  Any blood disorders you have.  Any surgeries you have had.  History of kidney problems or kidney failure.  Any medical conditions you have.  Whether you are pregnant or may be pregnant. What are the risks? Generally, this is a safe procedure. However, problems may occur, including:  Infection.  Allergic reaction to medicines or dyes that are used.  Bleeding from the access site or other locations.  Kidney injury, especially in people with impaired kidney function.  Stroke (rare).  Heart attack (rare).  Damage to other structures or organs. What happens before the procedure? Staying hydrated Follow instructions from your health care provider about  hydration, which may include:  Up to 2 hours before the procedure - you may continue to drink clear liquids, such as water, clear fruit juice, black coffee, and plain tea. Eating and drinking restrictions Follow instructions from your health care provider about eating and drinking, which may include:  8 hours before the procedure - stop eating heavy meals or foods such as meat, fried foods, or fatty foods.  6 hours before the procedure - stop eating light meals or foods, such as toast or cereal.  2 hours before the procedure - stop drinking clear liquids. General instructions  Ask your health care provider about: ? Changing or stopping your regular medicines. This is especially important if you are taking diabetes medicines or blood thinners. ? Taking medicines such as ibuprofen. These medicines can thin your blood. Do not take these medicines before your procedure if your health care provider instructs you not to, though aspirin may be recommended prior to coronary angiograms.  Plan to have someone take you home from the hospital or clinic.  You may need to  have blood tests or X-rays done. What happens during the procedure?  An IV tube will be inserted into one of your veins.  You will be given one or more of the following: ? A medicine to help you relax (sedative). ? A medicine to numb the area where the catheter will be inserted into an artery (local anesthetic).  To reduce your risk of infection: ? Your health care team will wash or sanitize their hands. ? Your skin will be washed with soap. ? Hair may be removed from the area where the catheter will be inserted.  You will be connected to a continuous ECG monitor.  The catheter will be inserted into an artery. The location may be in your groin, in your wrist, or in the fold of your arm (near your elbow).  A type of X-ray (fluoroscopy) will be used to help guide the catheter to the opening of the blood vessel that is being  examined.  A dye will be injected into the catheter, and X-rays will be taken. The dye will help to show where any narrowing or blockages are located in the heart arteries.  Tell your health care provider if you have any chest pain or trouble breathing during the procedure.  If blockages are found, your health care provider may perform another procedure, such as inserting a coronary stent. The procedure may vary among health care providers and hospitals. What happens after the procedure?  After the procedure, you will need to keep the area still for a few hours, or for as long as told by your health care provider. If the procedure is done through the groin, you will be instructed to not bend and not cross your legs.  The insertion site will be checked frequently.  The pulse in your foot or wrist will be checked frequently.  You may have additional blood tests, X-rays, and a test that records the electrical activity of your heart (ECG).  Do not drive for 24 hours if you were given a sedative. Summary  A coronary angiogram is an X-ray procedure that is used to look into the arteries in the heart.  During the procedure, a dye (contrast dye) is injected through a long, thin tube (catheter). The catheter is inserted through the groin, wrist, or arm.  Tell your health care provider about any allergies you have, including allergies to contrast dye.  After the procedure, you will need to keep the area still for a few hours, or for as long as told by your health care provider. This information is not intended to replace advice given to you by your health care provider. Make sure you discuss any questions you have with your health care provider. Document Released: 12/12/2002 Document Revised: 03/19/2016 Document Reviewed: 03/19/2016 Elsevier Interactive Patient Education  2019 [employer].

## 2019-05-24 ENCOUNTER — Telehealth: Payer: Self-pay | Admitting: Cardiology

## 2019-05-24 NOTE — Telephone Encounter (Signed)
New Message    Pt is calling to schedule her CT  She would like to have it scheduled before the end of the year due to insurance reasons   Please call back

## 2019-05-25 NOTE — Telephone Encounter (Signed)
Left detailed message again, explaining that the procedure has to be approved by insurance prior to scheduling. Reiterated that [facility] will call her with availability.

## 2019-06-09 LAB — BASIC METABOLIC PANEL
BUN/Creatinine Ratio: 15 (ref 12–28)
BUN: 12 mg/dL (ref 8–27)
CO2: 28 mmol/L (ref 20–29)
Calcium: 9.3 mg/dL (ref 8.7–10.3)
Chloride: 101 mmol/L (ref 96–106)
Creatinine, Ser: 0.82 mg/dL (ref 0.57–1.00)
GFR calc Af Amer: 86 mL/min/{1.73_m2} (ref >59)
GFR calc non Af Amer: 75 mL/min/{1.73_m2} (ref >59)
Glucose: 97 mg/dL (ref 65–99)
Potassium: 4.3 mmol/L (ref 3.5–5.2)
Sodium: 141 mmol/L (ref 134–144)

## 2019-06-09 LAB — HEPATIC FUNCTION PANEL
ALT: 18 IU/L (ref 0–32)
AST: 20 IU/L (ref 0–40)
Albumin: 4.3 g/dL (ref 3.8–4.8)
Alkaline Phosphatase: 67 IU/L (ref 39–117)
Bilirubin Total: 0.5 mg/dL (ref 0.0–1.2)
Bilirubin, Direct: 0.13 mg/dL (ref 0.00–0.40)
Total Protein: 6.4 g/dL (ref 6.0–8.5)

## 2019-06-09 LAB — LIPID PANEL
Chol/HDL Ratio: 3.6 ratio (ref 0.0–4.4)
Cholesterol, Total: 165 mg/dL (ref 100–199)
HDL: 46 mg/dL (ref >39)
LDL Chol Calc (NIH): 89 mg/dL (ref 0–99)
Triglycerides: 175 mg/dL — ABNORMAL HIGH (ref 0–149)
VLDL Cholesterol Cal: 30 mg/dL (ref 5–40)

## 2019-06-12 ENCOUNTER — Encounter: Payer: Self-pay | Admitting: *Deleted

## 2019-06-13 ENCOUNTER — Other Ambulatory Visit: Payer: Self-pay | Admitting: Family Medicine

## 2019-06-13 DIAGNOSIS — E2839 Other primary ovarian failure: Secondary | ICD-10-CM

## 2019-06-25 ENCOUNTER — Other Ambulatory Visit: Payer: Self-pay

## 2019-06-25 ENCOUNTER — Ambulatory Visit (HOSPITAL_COMMUNITY)
Admission: RE | Admit: 2019-06-25 | Discharge: 2019-06-25 | Disposition: A | Discharge status: Home / Self Care | Payer: Medicare Other | Source: Ambulatory Visit | Attending: Cardiology | Admitting: Cardiology

## 2019-06-25 ENCOUNTER — Encounter (HOSPITAL_COMMUNITY): Payer: Self-pay

## 2019-06-25 DIAGNOSIS — I2510 Atherosclerotic heart disease of native coronary artery without angina pectoris: Secondary | ICD-10-CM | POA: Diagnosis not present

## 2019-06-25 DIAGNOSIS — I712 Thoracic aortic aneurysm, without rupture: Secondary | ICD-10-CM | POA: Insufficient documentation

## 2019-06-25 DIAGNOSIS — I2541 Coronary artery aneurysm: Secondary | ICD-10-CM | POA: Diagnosis not present

## 2019-06-25 MED ORDER — IOHEXOL 350 MG/ML SOLN
100.0000 mL | Freq: Once | INTRAVENOUS | Status: AC | PRN
  Administered 2019-06-25: 16:00:00 100 mL via INTRAVENOUS

## 2019-06-25 MED ORDER — NITROGLYCERIN 0.4 MG SL SUBL
SUBLINGUAL_TABLET | SUBLINGUAL | Status: AC
  Filled 2019-06-25: qty 2

## 2019-06-25 MED ORDER — NITROGLYCERIN 0.4 MG SL SUBL
0.8000 mg | SUBLINGUAL_TABLET | Freq: Once | SUBLINGUAL | Status: AC
  Administered 2019-06-25: 0.8 mg via SUBLINGUAL

## 2019-06-28 ENCOUNTER — Other Ambulatory Visit: Payer: Self-pay

## 2019-06-28 MED ORDER — CLOPIDOGREL BISULFATE 75 MG PO TABS: 75.0000 mg | ORAL_TABLET | Freq: Every day | ORAL | 3 refills | Status: DC

## 2019-07-05 ENCOUNTER — Ambulatory Visit (HOSPITAL_COMMUNITY): Payer: Medicare Other

## 2019-07-08 ENCOUNTER — Ambulatory Visit: Payer: Medicare Other | Attending: Internal Medicine

## 2019-07-08 DIAGNOSIS — Z23 Encounter for immunization: Secondary | ICD-10-CM

## 2019-07-08 NOTE — Progress Notes (Signed)
   Covid-19 Vaccination Clinic  Name:  Brittany Middleton    MRN: [id number] DOB: 05/14/1951  07/08/2019  Brittany Middleton was observed post Covid-19 immunization for 15 minutes without incidence. She was provided with Vaccine Information Sheet and instruction to access the V-Safe system.   Brittany Middleton was instructed to call 911 with any severe reactions post vaccine: Marland Kitchen Difficulty breathing  . Swelling of your face and throat  . A fast heartbeat  . A bad rash all over your body  . Dizziness and weakness

## 2019-07-26 ENCOUNTER — Ambulatory Visit: Payer: Medicare Other | Attending: Internal Medicine

## 2019-07-26 DIAGNOSIS — Z23 Encounter for immunization: Secondary | ICD-10-CM

## 2019-07-26 NOTE — Progress Notes (Signed)
   Covid-19 Vaccination Clinic  Name:  Brittany Middleton    MRN: [id number] DOB: 06/11/1951  07/26/2019  [name] was observed post Covid-19 immunization for 15 minutes without incidence. She was provided with Vaccine Information Sheet and instruction to access the V-Safe system.   [name] was instructed to call 911 with any severe reactions post vaccine: Marland Kitchen Difficulty breathing  . Swelling of your face and throat  . A fast heartbeat  . A bad rash all over your body  . Dizziness and weakness    Immunizations Administered    Name Date Dose VIS Date Route   Pfizer COVID-19 Vaccine 07/26/2019  9:35 AM 0.3 mL 06/01/2019 Intramuscular   Manufacturer: [place]   Lot: [id number]   [place]: [id number]

## 2019-08-03 ENCOUNTER — Ambulatory Visit: Payer: Medicare Other

## 2019-08-21 ENCOUNTER — Other Ambulatory Visit: Payer: Self-pay | Admitting: Family Medicine

## 2019-08-21 DIAGNOSIS — Z1231 Encounter for screening mammogram for malignant neoplasm of breast: Secondary | ICD-10-CM

## 2019-08-28 ENCOUNTER — Ambulatory Visit
Admission: RE | Admit: 2019-08-28 | Discharge: 2019-08-28 | Disposition: A | Discharge status: Home / Self Care | Payer: Medicare Other | Source: Ambulatory Visit | Attending: Family Medicine | Admitting: Family Medicine

## 2019-08-28 ENCOUNTER — Other Ambulatory Visit: Payer: Self-pay

## 2019-08-28 DIAGNOSIS — E2839 Other primary ovarian failure: Secondary | ICD-10-CM

## 2019-09-06 ENCOUNTER — Other Ambulatory Visit: Payer: Self-pay | Admitting: Orthopedic Surgery

## 2019-09-06 DIAGNOSIS — M542 Cervicalgia: Secondary | ICD-10-CM

## 2019-09-20 ENCOUNTER — Ambulatory Visit: Payer: Medicare Other

## 2019-09-28 ENCOUNTER — Ambulatory Visit
Admission: RE | Admit: 2019-09-28 | Discharge: 2019-09-28 | Disposition: A | Discharge status: Home / Self Care | Payer: Medicare Other | Source: Ambulatory Visit | Attending: Family Medicine | Admitting: Family Medicine

## 2019-09-28 ENCOUNTER — Other Ambulatory Visit: Payer: Self-pay

## 2019-09-28 DIAGNOSIS — Z1231 Encounter for screening mammogram for malignant neoplasm of breast: Secondary | ICD-10-CM

## 2019-10-06 ENCOUNTER — Ambulatory Visit
Admission: RE | Admit: 2019-10-06 | Discharge: 2019-10-06 | Disposition: A | Discharge status: Home / Self Care | Payer: Medicare Other | Source: Ambulatory Visit | Attending: Orthopedic Surgery | Admitting: Orthopedic Surgery

## 2019-10-06 DIAGNOSIS — M542 Cervicalgia: Secondary | ICD-10-CM

## 2019-10-29 ENCOUNTER — Telehealth: Payer: Self-pay

## 2019-10-29 NOTE — Telephone Encounter (Signed)
   [facility] Pre-operative Risk Assessment    Request for surgical clearance:  1. What type of surgery is being performed? Cervical Epidural Steroid Injections   2. When is this surgery scheduled? TBD   3. What type of clearance is required (medical clearance vs. Pharmacy clearance to hold med vs. Both)? Pharmacy  4. Are there any medications that need to be held prior to surgery and how long? Plavix 5 - 7 days prior   5. Practice name and name of physician performing surgery? [facility] Dr. Normajean Glasgow   6. What is your office phone number: [phone]    7.   What is your office fax number: [phone]  8.   Anesthesia type (None, local, MAC, general) ? N/A   Newt Minion 10/29/2019, 3:10 PM  _________________________________________________________________   (provider comments below)

## 2019-10-29 NOTE — Telephone Encounter (Signed)
   Primary Cardiologist: Jenean Lindau, MD  Chart reviewed as part of pre-operative protocol coverage. Patient was contacted 10/29/2019 in reference to pre-operative risk assessment for pending surgery as outlined below.  Brittany Middleton was last seen on 05/23/19 by Dr. Geraldo Pitter. H/o coronary artery aneurysm, ascending aortic aneurysm, morbid obesity, dyslipidemia, HTN, disc disease, hepatic steatosis. Per his note, patient prefers Plavix instead of aspirin. RCRI 0.9% indicating low risk of CV complications. Coronary CTA 06/2019 showed nonobstructive CAD, coronary aneurysm in the RCA unchanged from 02/2018. I spoke with patient who affirms she is doing great without any new cardiac symptoms.[place] is [place] the beach and actually walked quite a bit the last few days and felt great. Therefore, based on ACC/AHA guidelines, the patient would be at acceptable risk for the planned procedure without further cardiovascular testing.   I will route chart to Dr. Geraldo Pitter for input on holding Plavix 5-7 days as requested then we can finalized clearance. Dr. Geraldo Pitter - Please route response to P [facility] PREOP (the pre-op pool). Thank you.   Charlie Pitter, PA-C 10/29/2019, 4:49 PM

## 2019-10-30 NOTE — Telephone Encounter (Signed)
I have not seen the patient recently as you mentioned however based on your documentation I feel what you have documented in your impression is reasonable and is in line with the recommendations.

## 2019-10-30 NOTE — Telephone Encounter (Signed)
   Primary Cardiologist: Jenean Lindau, MD  Chart revisited as part of pre-operative protocol coverage. As outlined below, given past medical history and time since last visit, based on ACC/AHA guidelines, Brittany Middleton would be at acceptable risk for the planned procedure without further cardiovascular testing.   Per discussion with Dr. Geraldo Pitter on Epic, patient may hold Plavix 5-7 days prior to procedure. We typically advise that blood thinners be resumed when felt safe by performing physician.  I will route this recommendation to the requesting party via Epic fax function and remove from pre-op pool.  Please call with questions.  Charlie Pitter, PA-C 10/30/2019, 3:07 PM

## 2019-12-17 ENCOUNTER — Telehealth: Payer: Self-pay | Admitting: Cardiology

## 2019-12-17 DIAGNOSIS — E7800 Pure hypercholesterolemia, unspecified: Secondary | ICD-10-CM

## 2019-12-17 DIAGNOSIS — I10 Essential (primary) hypertension: Secondary | ICD-10-CM

## 2019-12-17 DIAGNOSIS — Z79899 Other long term (current) drug therapy: Secondary | ICD-10-CM

## 2019-12-17 DIAGNOSIS — E782 Mixed hyperlipidemia: Secondary | ICD-10-CM

## 2019-12-17 NOTE — Telephone Encounter (Signed)
Message left on VM.

## 2019-12-17 NOTE — Telephone Encounter (Signed)
Ok . Do all labs

## 2019-12-17 NOTE — Telephone Encounter (Signed)
Patient called to state that labs are usually done before her appointment and wanted to ask if Dr. Geraldo Pitter wanted to get labs done before her appointment on 7/2 at 4:20.

## 2019-12-21 ENCOUNTER — Encounter: Payer: Self-pay | Admitting: Cardiology

## 2019-12-21 ENCOUNTER — Ambulatory Visit (INDEPENDENT_AMBULATORY_CARE_PROVIDER_SITE_OTHER): Payer: Medicare Other | Admitting: Cardiology

## 2019-12-21 ENCOUNTER — Other Ambulatory Visit: Payer: Self-pay

## 2019-12-21 VITALS — BP 126/60 | HR 60 | Ht 65.0 in | Wt 265.0 lb

## 2019-12-21 DIAGNOSIS — E782 Mixed hyperlipidemia: Secondary | ICD-10-CM

## 2019-12-21 DIAGNOSIS — I10 Essential (primary) hypertension: Secondary | ICD-10-CM | POA: Diagnosis not present

## 2019-12-21 DIAGNOSIS — I712 Thoracic aortic aneurysm, without rupture: Secondary | ICD-10-CM | POA: Diagnosis not present

## 2019-12-21 DIAGNOSIS — I7121 Aneurysm of the ascending aorta, without rupture: Secondary | ICD-10-CM

## 2019-12-21 DIAGNOSIS — I2541 Coronary artery aneurysm: Secondary | ICD-10-CM | POA: Diagnosis not present

## 2019-12-21 LAB — CBC WITH DIFFERENTIAL/PLATELET
Basophils Absolute: 0 10*3/uL (ref 0.0–0.2)
Basos: 1 %
EOS (ABSOLUTE): 0.2 10*3/uL (ref 0.0–0.4)
Eos: 5 %
Hematocrit: 42.5 % (ref 34.0–46.6)
Hemoglobin: 13.9 g/dL (ref 11.1–15.9)
Immature Grans (Abs): 0 10*3/uL (ref 0.0–0.1)
Immature Granulocytes: 0 %
Lymphocytes Absolute: 1.3 10*3/uL (ref 0.7–3.1)
Lymphs: 31 %
MCH: 30.3 pg (ref 26.6–33.0)
MCHC: 32.7 g/dL (ref 31.5–35.7)
MCV: 93 fL (ref 79–97)
Monocytes Absolute: 0.4 10*3/uL (ref 0.1–0.9)
Monocytes: 10 %
Neutrophils Absolute: 2.1 10*3/uL (ref 1.4–7.0)
Neutrophils: 53 %
Platelets: 207 10*3/uL (ref 150–450)
RBC: 4.58 x10E6/uL (ref 3.77–5.28)
RDW: 13.5 % (ref 11.7–15.4)
WBC: 4 10*3/uL (ref 3.4–10.8)

## 2019-12-21 LAB — BASIC METABOLIC PANEL
BUN/Creatinine Ratio: 23 (ref 12–28)
BUN: 16 mg/dL (ref 8–27)
CO2: 28 mmol/L (ref 20–29)
Calcium: 9.2 mg/dL (ref 8.7–10.3)
Chloride: 102 mmol/L (ref 96–106)
Creatinine, Ser: 0.69 mg/dL (ref 0.57–1.00)
GFR calc Af Amer: 104 mL/min/{1.73_m2} (ref >59)
GFR calc non Af Amer: 90 mL/min/{1.73_m2} (ref >59)
Glucose: 96 mg/dL (ref 65–99)
Potassium: 4.7 mmol/L (ref 3.5–5.2)
Sodium: 143 mmol/L (ref 134–144)

## 2019-12-21 LAB — HEPATIC FUNCTION PANEL
ALT: 16 IU/L (ref 0–32)
AST: 19 IU/L (ref 0–40)
Albumin: 4.4 g/dL (ref 3.8–4.8)
Alkaline Phosphatase: 62 IU/L (ref 48–121)
Bilirubin Total: 0.7 mg/dL (ref 0.0–1.2)
Bilirubin, Direct: 0.17 mg/dL (ref 0.00–0.40)
Total Protein: 6.5 g/dL (ref 6.0–8.5)

## 2019-12-21 LAB — LIPID PANEL
Chol/HDL Ratio: 3.7 ratio (ref 0.0–4.4)
Cholesterol, Total: 183 mg/dL (ref 100–199)
HDL: 49 mg/dL (ref >39)
LDL Chol Calc (NIH): 105 mg/dL — ABNORMAL HIGH (ref 0–99)
Triglycerides: 167 mg/dL — ABNORMAL HIGH (ref 0–149)
VLDL Cholesterol Cal: 29 mg/dL (ref 5–40)

## 2019-12-21 LAB — TSH: TSH: 1.58 u[IU]/mL (ref 0.450–4.500)

## 2019-12-21 NOTE — Patient Instructions (Signed)

## 2019-12-25 MED ORDER — ROSUVASTATIN CALCIUM 20 MG PO TABS: 20.0000 mg | ORAL_TABLET | Freq: Every day | ORAL | 2 refills | Status: DC

## 2019-12-25 NOTE — Addendum Note (Signed)
Addended by: Truddie Hidden on: 12/25/2019 09:12 AM   Modules accepted: Orders

## 2020-01-29 NOTE — Progress Notes (Signed)
Cardiology Office Note:    Date:  01/29/2020   ID:  Brittany Middleton, DOB 01-16-1951, MRN [id number]  PCP:  Maurice Small, MD  Cardiologist:  Jenean Lindau, MD   Referring MD: Maurice Small, MD    ASSESSMENT:    1. Coronary aneurysm   2. Ascending aortic aneurysm ([place])   3. Mixed hyperlipidemia   4. Essential hypertension    PLAN:    In order of problems listed above:  1. Coronary artery aneurysm: Secondary prevention stressed with the patient.  Importance of compliance with diet medication stressed and she vocalized understanding. 2. Essential hypertension: Blood pressure is stable 3. Mixed dyslipidemia: Diet was emphasized.  Lipids were reviewed. 4. Patient will be seen in follow-up appointment in 6 months or earlier if the patient has any concerns    Medication Adjustments/Labs and Tests Ordered: Current medicines are reviewed at length with the patient today.  Concerns regarding medicines are outlined above.  No orders of the defined types were placed in this encounter.  No orders of the defined types were placed in this encounter.    No chief complaint on file.    History of Present Illness:    Brittany Middleton is a 69 y.o. female.  Patient has past medical history of coronary aneurysm, essential hypertension and dyslipidemia.  He denies any problems at this time and takes care of activities of daily living.  No chest pain orthopnea or PND.  She is here for a routine visit.  Past Medical History:  Diagnosis Date  . Cervical disc disease    MRI 2016  . Hepatic steatosis    per imaging 2016, 2019  . Hiatal hernia   . History of palpitations    Holter monitor, 2017: NSR, occasional PVC, PACs, no arrhythmia  . Hypercholesteremia   . Hypertension   . Lumbar disc disease    MRI, 2017     Past Surgical History:  Procedure Laterality Date  . BLADDER REPAIR    . BREAST EXCISIONAL BIOPSY Left   . CARPAL TUNNEL RELEASE Right   . CATARACT EXTRACTION,  BILATERAL  2014  . COLONOSCOPY WITH PROPOFOL N/A 07/18/2018   Procedure: COLONOSCOPY WITH PROPOFOL;  Surgeon: Laurence Spates, MD;  Location: [facility];  Service: Endoscopy;  Laterality: N/A;  . ESOPHAGOGASTRODUODENOSCOPY (EGD) WITH PROPOFOL N/A 07/17/2018   Procedure: ESOPHAGOGASTRODUODENOSCOPY (EGD) WITH PROPOFOL;  Surgeon: Laurence Spates, MD;  Location: [facility];  Service: Endoscopy;  Laterality: N/A;  . REPAIR RECTOCELE      Current Medications: Current Meds  Medication Sig  . acetaminophen (TYLENOL) 500 MG tablet Take 1,000 mg by mouth every 6 (six) hours as needed for mild pain, moderate pain, fever or headache.  . cholecalciferol (VITAMIN D) 1000 UNITS tablet Take 1,000 Units by mouth daily.  . clopidogrel (PLAVIX) 75 MG tablet Take 1 tablet (75 mg total) by mouth daily.  . Coenzyme Q10 (CO Q 10 PO) Take 1 tablet by mouth daily.  Marland Kitchen ELDERBERRY PO Take 125 mg by mouth.  . IRON PO Take 1 tablet by mouth every other day.  Javier Docker Oil 300 MG CAPS Take 1 capsule by mouth daily.  Marland Kitchen lisinopril-hydrochlorothiazide (PRINZIDE,ZESTORETIC) 20-25 MG per tablet Take 1 tablet by mouth daily.  . Multiple Vitamins-Minerals (MULTIVITAMIN WITH MINERALS) tablet Take 1 tablet by mouth daily.  . [DISCONTINUED] rosuvastatin (CRESTOR) 10 MG tablet Take 1 tablet (10 mg total) by mouth daily.     Allergies:   Ciprofloxacin and Codeine  Social History   Socioeconomic History  . Marital status: Divorced    Spouse name: Not on file  . Number of children: 2  . Years of education: Not on file  . Highest education level: Master's degree (e.g., MA, MS, MEng, MEd, MSW, MBA)  Occupational History  . Occupation: Retired  Tobacco Use  . Smoking status: Never Smoker  . Smokeless tobacco: Never Used  Vaping Use  . Vaping Use: Never used  Substance and Sexual Activity  . Alcohol use: No  . Drug use: No  . Sexual activity: Not on file  Other Topics Concern  . Not on file  Social History Narrative  .  Not on file   Social Determinants of Health   Financial Resource Strain:   . Difficulty of Paying Living Expenses:   Food Insecurity:   . Worried About Charity fundraiser in the Last Year:   . Arboriculturist in the Last Year:   Transportation Needs:   . Film/video editor (Medical):   Marland Kitchen Lack of Transportation (Non-Medical):   Physical Activity:   . Days of Exercise per Week:   . Minutes of Exercise per Session:   Stress:   . Feeling of Stress :   Social Connections:   . Frequency of Communication with Friends and Family:   . Frequency of Social Gatherings with Friends and Family:   . Attends Religious Services:   . Active Member of Clubs or Organizations:   . Attends Archivist Meetings:   Marland Kitchen Marital Status:      Family History: The patient's family history includes Breast cancer (age of onset: 53) in her mother; CAD in her mother.  ROS:   Please see the history of present illness.    All other systems reviewed and are negative.  EKGs/Labs/Other Studies Reviewed:    The following studies were reviewed today: IMPRESSION: 1. Coronary calcium score of 35.2. This was 68th percentile for age and sex matched control.  2. Normal coronary origin with right dominance.  3.  Non-obstructive CAD.  4. Coronary aneurysm in the RCA unchanged from 02/28/18.  Skeet Latch, MD   Electronically Signed   By: Skeet Latch   On: 06/25/2019 18:04   Recent Labs: 12/20/2019: ALT 16; BUN 16; Creatinine, Ser 0.69; Hemoglobin 13.9; Platelets 207; Potassium 4.7; Sodium 143; TSH 1.580  Recent Lipid Panel    Component Value Date/Time   CHOL 183 12/20/2019 0829   TRIG 167 (H) 12/20/2019 0829   HDL 49 12/20/2019 0829   CHOLHDL 3.7 12/20/2019 0829   CHOLHDL 4.5 02/28/2018 0345   VLDL 41 (H) 02/28/2018 0345   LDLCALC 105 (H) 12/20/2019 0829    Physical Exam:    VS:  BP 126/60   Pulse 60   Ht 5\' 5"  (1.651 m)   Wt 265 lb (120.2 kg)   SpO2 95%   BMI  44.10 kg/m     Wt Readings from Last 3 Encounters:  12/21/19 265 lb (120.2 kg)  05/23/19 265 lb (120.2 kg)  04/05/19 265 lb (120.2 kg)     GEN: Patient is in no acute distress HEENT: Normal NECK: No JVD; No carotid bruits LYMPHATICS: No lymphadenopathy CARDIAC: Hear sounds regular, 2/6 systolic murmur at the apex. RESPIRATORY:  Clear to auscultation without rales, wheezing or rhonchi  ABDOMEN: Soft, non-tender, non-distended MUSCULOSKELETAL:  No edema; No deformity  SKIN: Warm and dry NEUROLOGIC:  Alert and oriented x 3 PSYCHIATRIC:  Normal affect  Signed, Jenean Lindau, MD  01/29/2020 9:54 AM    [place]

## 2020-02-05 LAB — LIPID PANEL
Chol/HDL Ratio: 3.7 ratio (ref 0.0–4.4)
Cholesterol, Total: 177 mg/dL (ref 100–199)
HDL: 48 mg/dL (ref >39)
LDL Chol Calc (NIH): 100 mg/dL — ABNORMAL HIGH (ref 0–99)
Triglycerides: 166 mg/dL — ABNORMAL HIGH (ref 0–149)
VLDL Cholesterol Cal: 29 mg/dL (ref 5–40)

## 2020-02-05 LAB — HEPATIC FUNCTION PANEL
ALT: 18 IU/L (ref 0–32)
AST: 20 IU/L (ref 0–40)
Albumin: 4.2 g/dL (ref 3.8–4.8)
Alkaline Phosphatase: 64 IU/L (ref 48–121)
Bilirubin Total: 0.7 mg/dL (ref 0.0–1.2)
Bilirubin, Direct: 0.17 mg/dL (ref 0.00–0.40)
Total Protein: 6.4 g/dL (ref 6.0–8.5)

## 2020-03-30 ENCOUNTER — Emergency Department (HOSPITAL_BASED_OUTPATIENT_CLINIC_OR_DEPARTMENT_OTHER): Payer: Medicare Other

## 2020-03-30 ENCOUNTER — Encounter (HOSPITAL_BASED_OUTPATIENT_CLINIC_OR_DEPARTMENT_OTHER): Payer: Self-pay | Admitting: Emergency Medicine

## 2020-03-30 ENCOUNTER — Inpatient Hospital Stay (HOSPITAL_BASED_OUTPATIENT_CLINIC_OR_DEPARTMENT_OTHER)
Admission: EM | Admit: 2020-03-30 | Discharge: 2020-04-02 | DRG: 689 | Disposition: A | Discharge status: Home / Self Care | Payer: Medicare Other | Attending: Internal Medicine | Admitting: Internal Medicine

## 2020-03-30 ENCOUNTER — Other Ambulatory Visit: Payer: Self-pay

## 2020-03-30 DIAGNOSIS — E785 Hyperlipidemia, unspecified: Secondary | ICD-10-CM | POA: Diagnosis present

## 2020-03-30 DIAGNOSIS — E7800 Pure hypercholesterolemia, unspecified: Secondary | ICD-10-CM | POA: Diagnosis present

## 2020-03-30 DIAGNOSIS — J9601 Acute respiratory failure with hypoxia: Secondary | ICD-10-CM | POA: Diagnosis present

## 2020-03-30 DIAGNOSIS — R0902 Hypoxemia: Secondary | ICD-10-CM

## 2020-03-30 DIAGNOSIS — N12 Tubulo-interstitial nephritis, not specified as acute or chronic: Principal | ICD-10-CM | POA: Diagnosis present

## 2020-03-30 DIAGNOSIS — K760 Fatty (change of) liver, not elsewhere classified: Secondary | ICD-10-CM | POA: Diagnosis present

## 2020-03-30 DIAGNOSIS — I959 Hypotension, unspecified: Secondary | ICD-10-CM | POA: Diagnosis present

## 2020-03-30 DIAGNOSIS — I110 Hypertensive heart disease with heart failure: Secondary | ICD-10-CM | POA: Diagnosis present

## 2020-03-30 DIAGNOSIS — K219 Gastro-esophageal reflux disease without esophagitis: Secondary | ICD-10-CM | POA: Diagnosis present

## 2020-03-30 DIAGNOSIS — Z6841 Body Mass Index (BMI) 40.0 and over, adult: Secondary | ICD-10-CM

## 2020-03-30 DIAGNOSIS — Z7902 Long term (current) use of antithrombotics/antiplatelets: Secondary | ICD-10-CM

## 2020-03-30 DIAGNOSIS — Z20822 Contact with and (suspected) exposure to covid-19: Secondary | ICD-10-CM | POA: Diagnosis present

## 2020-03-30 DIAGNOSIS — I5032 Chronic diastolic (congestive) heart failure: Secondary | ICD-10-CM | POA: Diagnosis present

## 2020-03-30 DIAGNOSIS — Z79899 Other long term (current) drug therapy: Secondary | ICD-10-CM

## 2020-03-30 DIAGNOSIS — G4733 Obstructive sleep apnea (adult) (pediatric): Secondary | ICD-10-CM | POA: Diagnosis present

## 2020-03-30 HISTORY — DX: Urinary tract infection, site not specified: N39.0

## 2020-03-30 HISTORY — DX: Tubulo-interstitial nephritis, not specified as acute or chronic: N12

## 2020-03-30 LAB — URINALYSIS, ROUTINE W REFLEX MICROSCOPIC
Bilirubin Urine: NEGATIVE
Glucose, UA: NEGATIVE mg/dL
Ketones, ur: NEGATIVE mg/dL
Nitrite: NEGATIVE
Protein, ur: 30 mg/dL — AB
Specific Gravity, Urine: 1.02 (ref 1.005–1.030)
pH: 6 (ref 5.0–8.0)

## 2020-03-30 LAB — COMPREHENSIVE METABOLIC PANEL
ALT: 19 U/L (ref 0–44)
AST: 25 U/L (ref 15–41)
Albumin: 3.9 g/dL (ref 3.5–5.0)
Alkaline Phosphatase: 50 U/L (ref 38–126)
Anion gap: 11 (ref 5–15)
BUN: 14 mg/dL (ref 8–23)
CO2: 25 mmol/L (ref 22–32)
Calcium: 8.8 mg/dL — ABNORMAL LOW (ref 8.9–10.3)
Chloride: 98 mmol/L (ref 98–111)
Creatinine, Ser: 0.78 mg/dL (ref 0.44–1.00)
GFR, Estimated: >60 mL/min (ref >60)
Glucose, Bld: 143 mg/dL — ABNORMAL HIGH (ref 70–99)
Potassium: 3.5 mmol/L (ref 3.5–5.1)
Sodium: 134 mmol/L — ABNORMAL LOW (ref 135–145)
Total Bilirubin: 1.2 mg/dL (ref 0.3–1.2)
Total Protein: 7.1 g/dL (ref 6.5–8.1)

## 2020-03-30 LAB — CBC WITH DIFFERENTIAL/PLATELET
Abs Immature Granulocytes: 0.06 10*3/uL (ref 0.00–0.07)
Basophils Absolute: 0 10*3/uL (ref 0.0–0.1)
Basophils Relative: 0 %
Eosinophils Absolute: 0 10*3/uL (ref 0.0–0.5)
Eosinophils Relative: 0 %
HCT: 41.8 % (ref 36.0–46.0)
Hemoglobin: 13.8 g/dL (ref 12.0–15.0)
Immature Granulocytes: 1 %
Lymphocytes Relative: 3 %
Lymphs Abs: 0.3 10*3/uL — ABNORMAL LOW (ref 0.7–4.0)
MCH: 29.9 pg (ref 26.0–34.0)
MCHC: 33 g/dL (ref 30.0–36.0)
MCV: 90.5 fL (ref 80.0–100.0)
Monocytes Absolute: 0.4 10*3/uL (ref 0.1–1.0)
Monocytes Relative: 4 %
Neutro Abs: 10 10*3/uL — ABNORMAL HIGH (ref 1.7–7.7)
Neutrophils Relative %: 92 %
Platelets: 186 10*3/uL (ref 150–400)
RBC: 4.62 MIL/uL (ref 3.87–5.11)
RDW: 13.1 % (ref 11.5–15.5)
WBC: 10.8 10*3/uL — ABNORMAL HIGH (ref 4.0–10.5)
nRBC: 0 % (ref 0.0–0.2)

## 2020-03-30 LAB — URINALYSIS, MICROSCOPIC (REFLEX)

## 2020-03-30 LAB — RESPIRATORY PANEL BY RT PCR (FLU A&B, COVID)
Influenza A by PCR: NEGATIVE
Influenza B by PCR: NEGATIVE
SARS Coronavirus 2 by RT PCR: NEGATIVE

## 2020-03-30 LAB — LACTIC ACID, PLASMA
Lactic Acid, Venous: 1.4 mmol/L (ref 0.5–1.9)
Lactic Acid, Venous: 1.4 mmol/L (ref 0.5–1.9)

## 2020-03-30 LAB — LIPASE, BLOOD: Lipase: 31 U/L (ref 11–51)

## 2020-03-30 LAB — TROPONIN I (HIGH SENSITIVITY): Troponin I (High Sensitivity): 8 ng/L (ref <18)

## 2020-03-30 LAB — BRAIN NATRIURETIC PEPTIDE: B Natriuretic Peptide: 95.5 pg/mL (ref 0.0–100.0)

## 2020-03-30 MED ORDER — IOHEXOL 350 MG/ML SOLN
100.0000 mL | Freq: Once | INTRAVENOUS | Status: AC | PRN
  Administered 2020-03-30: 75 mL via INTRAVENOUS

## 2020-03-30 MED ORDER — LACTATED RINGERS IV SOLN: INTRAVENOUS | Status: AC

## 2020-03-30 MED ORDER — ACETAMINOPHEN 325 MG PO TABS
650.0000 mg | ORAL_TABLET | Freq: Once | ORAL | Status: AC
  Administered 2020-03-30: 650 mg via ORAL
  Filled 2020-03-30: qty 2

## 2020-03-30 MED ORDER — SODIUM CHLORIDE 0.9 % IV SOLN
1.0000 g | INTRAVENOUS | Status: DC
  Administered 2020-03-30 – 2020-04-02 (×4): 1 g via INTRAVENOUS
  Filled 2020-03-30: qty 1
  Filled 2020-03-30: qty 10
  Filled 2020-03-30: qty 1
  Filled 2020-03-30: qty 10

## 2020-03-30 MED ORDER — SODIUM CHLORIDE 0.9 % IV BOLUS
1000.0000 mL | Freq: Once | INTRAVENOUS | Status: AC
  Administered 2020-03-30: 1000 mL via INTRAVENOUS

## 2020-03-30 NOTE — ED Notes (Signed)
Patient transported to CT 

## 2020-03-30 NOTE — ED Notes (Signed)
Pt up to [facility]

## 2020-03-30 NOTE — ED Provider Notes (Addendum)
[facility] EMERGENCY DEPARTMENT Provider Note   CSN: [id number] Arrival date & time: 03/30/20  1221     History Chief Complaint  Patient presents with  . Fever  . Abdominal Pain    Brittany Middleton is a 69 y.o. female with pertinent past medical history of hypertension, ascending aortic aneurysm, hyperlipidemia the presents emergency department today for shortness of breath, abdominal pain and fever for the past day.  Patient states that she was diagnosed at urgent care for UTI yesterday, was prescribed nitrofurantoin.  States that she started taking it yesterday had her first dose felt fine, states that had second dose at 9 PM and started having fevers, myalgias and shortness of breath.  Denies any rash or chest pain.  States that she is still having dysuria, now also having left back pain.  States that she did vomit a couple times, no hematemesis or coffee-ground emesis.  Does not feel nauseous currently.  Has not take anything for fever, states that her fever was 102 last night.  Is 102.9 here today.  Has been vaccinated against Covid.  Denies any sick contacts.  Denies any cough, sore throat, congestion.  States that abdominal pain is all over, more concentrated in suprapubic region.  States that she has not been able to eat or drink since yesterday.  Patient states that she normally takes saturation at home which is around 94 to 98%.  HPI     Past Medical History:  Diagnosis Date  . Cervical disc disease    MRI 2016  . Hepatic steatosis    per imaging 2016, 2019  . Hiatal hernia   . History of palpitations    Holter monitor, 2017: NSR, occasional PVC, PACs, no arrhythmia  . Hypercholesteremia   . Hypertension   . Lumbar disc disease    MRI, 2017     Patient Active Problem List   Diagnosis Date Noted  . Ascending aortic aneurysm ([place]) 05/23/2019  . Gastrointestinal bleeding 07/25/2018  . Hiatal hernia   . Anemia 07/17/2018  . Acute blood loss anemia  07/17/2018  . Hypokalemia 07/17/2018  . Gastrointestinal hemorrhage with melena 07/16/2018  . Morbid obesity ([place]) 05/22/2018  . Overweight 03/03/2018  . 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 Chest pain with moderate risk for cardiac etiology 02/27/2018  . HTN (hypertension) 02/27/2018  . HLD (hyperlipidemia) 02/27/2018  . Hypercholesterolemia 04/07/2017  . Hypertensive disorder 04/07/2017  . Palpitations 08/19/2015    Past Surgical History:  Procedure Laterality Date  . BLADDER REPAIR    . BREAST EXCISIONAL BIOPSY Left   . CARPAL TUNNEL RELEASE Right   . CATARACT EXTRACTION, BILATERAL  2014  . COLONOSCOPY WITH PROPOFOL N/A 07/18/2018   Procedure: COLONOSCOPY WITH PROPOFOL;  Surgeon: Laurence Spates, MD;  Location: [facility];  Service: Endoscopy;  Laterality: N/A;  . ESOPHAGOGASTRODUODENOSCOPY (EGD) WITH PROPOFOL N/A 07/17/2018   Procedure: ESOPHAGOGASTRODUODENOSCOPY (EGD) WITH PROPOFOL;  Surgeon: Laurence Spates, MD;  Location: [facility];  Service: Endoscopy;  Laterality: N/A;  . REPAIR RECTOCELE       OB History   No obstetric history on file.     Family History  Problem Relation Age of Onset  . CAD Mother   . Breast cancer Mother 37    Social History   Tobacco Use  . Smoking status: Never Smoker  . Smokeless tobacco: Never Used  Vaping Use  . Vaping Use: Never used  Substance Use Topics  . Alcohol use: No  . Drug use: No  Home Medications Prior to Admission medications   Medication Sig Start Date End Date Taking? Authorizing Provider  acetaminophen (TYLENOL) 500 MG tablet Take 1,000 mg by mouth every 6 (six) hours as needed for mild pain, moderate pain, fever or headache.    [provider]  cholecalciferol (VITAMIN D) 1000 UNITS tablet Take 1,000 Units by mouth daily.    [provider]  clopidogrel (PLAVIX) 75 MG tablet Take 1 tablet (75 mg total) by mouth daily. 06/28/19   Revankar, [name], MD  Coenzyme Q10 (CO Q 10 PO) Take 1 tablet by  mouth daily.    [provider]  ELDERBERRY PO Take 125 mg by mouth.    [provider]  IRON PO Take 1 tablet by mouth every other day.    [provider]  Javier Docker Oil 300 MG CAPS Take 1 capsule by mouth daily.    [provider]  lisinopril-hydrochlorothiazide (PRINZIDE,ZESTORETIC) 20-25 MG per tablet Take 1 tablet by mouth daily.    [provider]  Melatonin 5 MG CHEW Chew 5 mg by mouth at bedtime. Patient not taking: Reported on 12/21/2019    [provider]  metoprolol tartrate (LOPRESSOR) 50 MG tablet Take 2 tablets (100 mg total) by mouth once for 1 dose. TAKE 2 Tablets 2 hours prior to procedure if HR is 55 or greater 05/23/19 05/23/19  Revankar, [name], MD  Multiple Vitamins-Minerals (MULTIVITAMIN WITH MINERALS) tablet Take 1 tablet by mouth daily.    [provider]  rosuvastatin (CRESTOR) 20 MG tablet Take 1 tablet (20 mg total) by mouth daily. 12/25/19 03/24/20  Revankar, [name], MD  vitamin E 180 MG (400 UNITS) capsule Take by mouth. Patient not taking: Reported on 12/21/2019    [provider]    Allergies    Ciprofloxacin and Codeine  Review of Systems   Review of Systems  Constitutional: Negative for chills, diaphoresis, fatigue and fever.  HENT: Negative for congestion, sore throat and trouble swallowing.   Eyes: Negative for pain and visual disturbance.  Respiratory: Positive for shortness of breath. Negative for cough and wheezing.   Cardiovascular: Negative for chest pain, palpitations and leg swelling.  Gastrointestinal: Positive for abdominal pain, nausea and vomiting. Negative for abdominal distention and diarrhea.  Genitourinary: Positive for dysuria and flank pain. Negative for difficulty urinating, dyspareunia, frequency, genital sores, hematuria, vaginal bleeding and vaginal discharge.  Musculoskeletal: Positive for arthralgias and myalgias. Negative for back pain, neck pain and neck stiffness.   Skin: Negative for pallor.  Neurological: Negative for dizziness, speech difficulty, weakness and headaches.  Psychiatric/Behavioral: Negative for confusion.    Physical Exam Updated Vital Signs BP (!) 124/53   Pulse 78   Temp 99.9 F (37.7 C) (Oral)   Resp 20   Ht 5\' 5"  (1.651 m)   Wt 120.2 kg   SpO2 96%   BMI 44.10 kg/m   Physical Exam Constitutional:      General: She is in acute distress.     Appearance: Normal appearance. She is obese. She is not ill-appearing, toxic-appearing or diaphoretic.  HENT:     Mouth/Throat:     Mouth: Mucous membranes are moist.     Pharynx: Oropharynx is clear.  Eyes:     General: No scleral icterus.    Extraocular Movements: Extraocular movements intact.     Pupils: Pupils are equal, round, and reactive to light.  Cardiovascular:     Rate and Rhythm: Normal rate and regular rhythm.  Pulses: Normal pulses.     Heart sounds: Normal heart sounds.  Pulmonary:     Effort: Pulmonary effort is normal. No respiratory distress.     Breath sounds: Normal breath sounds. No stridor. No wheezing, rhonchi or rales.     Comments: Patient 88% on room air, states that she does feel short of breath.  No true respiratory distress, able speak to me in full sentences, no accessory muscle use.  On 2 L patient satting at 95%.  When patient moves or stands, shortness of breath more significant. Chest:     Chest wall: No tenderness.  Abdominal:     General: Abdomen is flat. Bowel sounds are normal. There is no distension.     Palpations: Abdomen is soft.     Tenderness: There is abdominal tenderness in the suprapubic area. There is left CVA tenderness. There is no right CVA tenderness, guarding or rebound.  Musculoskeletal:        General: No swelling or tenderness. Normal range of motion.     Cervical back: Normal range of motion and neck supple. No rigidity.     Right lower leg: No edema.     Left lower leg: No edema.  Skin:    General: Skin is warm  and dry.     Capillary Refill: Capillary refill takes less than 2 seconds.     Coloration: Skin is not pale.  Neurological:     General: No focal deficit present.     Mental Status: She is alert and oriented to person, place, and time.  Psychiatric:        Mood and Affect: Mood normal.        Behavior: Behavior normal.     ED Results / Procedures / Treatments   Labs (all labs ordered are listed, but only abnormal results are displayed) Labs Reviewed  COMPREHENSIVE METABOLIC PANEL - Abnormal; Notable for the following components:      Result Value   Sodium 134 (*)    Glucose, Bld 143 (*)    Calcium 8.8 (*)    All other components within normal limits  CBC WITH DIFFERENTIAL/PLATELET - Abnormal; Notable for the following components:   WBC 10.8 (*)    Neutro Abs 10.0 (*)    Lymphs Abs 0.3 (*)    All other components within normal limits  URINALYSIS, ROUTINE W REFLEX MICROSCOPIC - Abnormal; Notable for the following components:   APPearance CLOUDY (*)    Hgb urine dipstick LARGE (*)    Protein, ur 30 (*)    Leukocytes,Ua TRACE (*)    All other components within normal limits  URINALYSIS, MICROSCOPIC (REFLEX) - Abnormal; Notable for the following components:   Bacteria, UA MANY (*)    All other components within normal limits  RESPIRATORY PANEL BY RT PCR (FLU A&B, COVID)  URINE CULTURE  CULTURE, BLOOD (ROUTINE X 2)  CULTURE, BLOOD (ROUTINE X 2)  LACTIC ACID, PLASMA  LACTIC ACID, PLASMA  LIPASE, BLOOD  BRAIN NATRIURETIC PEPTIDE  TROPONIN I (HIGH SENSITIVITY)  TROPONIN I (HIGH SENSITIVITY)    EKG EKG Interpretation  Date/Time:  Sunday March 30 2020 12:36:07 EDT Ventricular Rate:  89 PR Interval:  138 QRS Duration: 88 QT Interval:  356 QTC Calculation: 433 R Axis:   95 Text Interpretation: Normal sinus rhythm Rightward axis Possible Anterior infarct , age undetermined Abnormal ECG Similar to prior, no STEMI Confirmed by Antony Blackbird [phone]) on 03/30/2020 2:37:13  PM   Radiology DG Chest Portable  1 View  Result Date: 03/30/2020 CLINICAL DATA:  Abdominal pain and shortness of breath. EXAM: PORTABLE CHEST 1 VIEW COMPARISON:  Chest radiograph 04/05/2019 FINDINGS: Normal cardiac and mediastinal contours. Minimal right basilar heterogeneous opacities. No pleural effusion or pneumothorax. IMPRESSION: Minimal right basilar atelectasis. Electronically Signed   By: Lovey Newcomer M.D.   On: 03/30/2020 13:57   CT Renal Stone Study  Result Date: 03/30/2020 CLINICAL DATA:  Recent diagnosis of urinary tract infection. Shortness of breath and dysuria. Flank pain. Kidney stone suspected. EXAM: CT ABDOMEN AND PELVIS WITHOUT CONTRAST TECHNIQUE: Multidetector CT imaging of the abdomen and pelvis was performed following the standard protocol without IV contrast. COMPARISON:  Prior CT 10/31/2014. FINDINGS: Lower chest: Mild dependent atelectasis at both lung bases. Mild coronary artery atherosclerosis. No significant pleural or pericardial effusion. Hepatobiliary: Generalized decreased hepatic density consistent with steatosis. No focal abnormalities are identified on noncontrast imaging. No evidence of gallstones, gallbladder wall thickening or biliary dilatation. Pancreas: Unremarkable. No pancreatic ductal dilatation or surrounding inflammatory changes. Spleen: Normal in size without focal abnormality. Adrenals/Urinary Tract: Both adrenal glands appear normal. Both kidneys appear unremarkable. There is no evidence of urinary tract calculus, hydronephrosis, perinephric soft tissue stranding or fluid collection. No focal renal abnormalities are identified. There is possible mild bladder wall thickening and possible mild perivesical inflammatory change. Stomach/Bowel: No evidence of bowel wall thickening, distention or surrounding inflammatory change. The stomach is incompletely distended. The appendix appears normal. Moderate stool throughout the colon. Scattered diverticulosis of the  descending and sigmoid colon. Vascular/Lymphatic: There are no enlarged abdominal or pelvic lymph nodes. Mild aortic and branch vessel atherosclerosis. Reproductive: The uterus and ovaries appear normal. No adnexal mass. Other: No evidence of abdominal wall mass or hernia. No ascites. Musculoskeletal: No acute or significant osseous findings. Multilevel spondylosis again noted. IMPRESSION: 1. Possible mild bladder wall thickening and perivesical inflammatory change, suggesting possible cystitis. Correlation with urine analysis recommended. 2. No evidence of urinary tract calculus or hydronephrosis. No perinephric inflammatory changes. 3. Hepatic steatosis. 4. Aortic Atherosclerosis (ICD10-I70.0). Electronically Signed   By: Richardean Sale M.D.   On: 03/30/2020 16:05    Procedures .Critical Care Performed by: Alfredia Client, PA-C Authorized by: Alfredia Client, PA-C   Critical care provider statement:    Critical care time (minutes):  45   Critical care was necessary to treat or prevent imminent or life-threatening deterioration of the following conditions:  Sepsis   Critical care was time spent personally by me on the following activities:  Discussions with consultants, evaluation of patient's response to treatment, examination of patient, ordering and performing treatments and interventions, ordering and review of laboratory studies, ordering and review of radiographic studies, pulse oximetry, re-evaluation of patient's condition, obtaining history from patient or surrogate and review of old charts   (including critical care time)  Medications Ordered in ED Medications  lactated ringers infusion ( Intravenous New Bag/Given 03/30/20 1616)  cefTRIAXone (ROCEPHIN) 1 g in sodium chloride 0.9 % 100 mL IVPB (0 g Intravenous Stopped 03/30/20 1530)  acetaminophen (TYLENOL) tablet 650 mg (650 mg Oral Given 03/30/20 1401)  sodium chloride 0.9 % bolus 1,000 mL (0 mLs Intravenous Stopped 03/30/20 1602)   iohexol (OMNIPAQUE) 350 MG/ML injection 100 mL (75 mLs Intravenous Contrast Given 03/30/20 1757)    ED Course  I have reviewed the triage vital signs and the nursing notes.  Pertinent labs & imaging results that were available during my care of the patient were reviewed by me and considered in my  medical decision making (see chart for details).    MDM Rules/Calculators/A&P                         Brittany Middleton is a 69 y.o. female with pertinent past medical history of hypertension, ascending aortic aneurysm, hyperlipidemia the presents emergency department today for shortness of breath, abdominal pain and fever for the past day. Code sepsis initiated due to patient's temperature of 102.6 and new hypoxia.  Patient satting at 88% on room air, will place on 2 L at this time.  Temperature 102.6, source most likely from urine.  Most likely urosepsis, I think patient most likely has pyelonephritis at this time most likely needs to come in for this.  Blood cultures drawn, patient started on ceftriaxone.  Unsure why patient is having shortness of breath, will obtain troponins, BNP, and CT PE study at this time. COVID negative.  Fortunately lactic acid 1.4, first troponin VIII.  BNP normal.  CBC without any leukocytosis, CMP acutely normal.  Urinalysis with small leukocytes, large hemoglobin.  Will obtain CT renal study at this time to make sure patient also does not have kidney stone.  CT renal suggestive of cystitis, no evidence of nephrolithiasis.  No hydronephrosis.  CT PE study pending.   Pt care was handed off to Dr. Reubin Milan.  Complete history and physical and current plan have been communicated.  Please refer to their note for the remainder of ED care and ultimate disposition. Awaiting CT PE study and then pt needs to be admitted.   I discussed this case with my attending physician who cosigned this note including patient's presenting symptoms, physical exam, and planned diagnostics and  interventions. Attending physician stated agreement with plan or made changes to plan which were implemented.   Attending physician assessed patient at bedside.  Final Clinical Impression(s) / ED Diagnoses Final diagnoses:  Pyelonephritis  Hypoxia    Rx / DC Orders ED Discharge Orders    None       Alfredia Client, PA-C 03/30/20 1859    Lucrezia Starch, MD 03/30/20 2214    Alfredia Client, PA-C 03/31/20 0174    Lucrezia Starch, MD 04/02/20 [phone]

## 2020-03-30 NOTE — ED Notes (Signed)
Provider at bedside to eval pt. 2L nasal cannula applied for O2 sats 89-91% on room air

## 2020-03-30 NOTE — ED Notes (Signed)
3rd COVID vaccine last week of September. New medicine phenazopyrid and nitrofurantoin

## 2020-03-30 NOTE — ED Triage Notes (Signed)
Pt c/o shortness of breath, abdominal pain after taking antibiotics for UTI.  Pt reports fever 102.0 today.

## 2020-03-31 ENCOUNTER — Encounter (HOSPITAL_COMMUNITY): Payer: Self-pay | Admitting: Internal Medicine

## 2020-03-31 DIAGNOSIS — R059 Cough, unspecified: Secondary | ICD-10-CM | POA: Diagnosis not present

## 2020-03-31 DIAGNOSIS — R0902 Hypoxemia: Secondary | ICD-10-CM | POA: Diagnosis not present

## 2020-03-31 DIAGNOSIS — I959 Hypotension, unspecified: Secondary | ICD-10-CM | POA: Diagnosis present

## 2020-03-31 DIAGNOSIS — N12 Tubulo-interstitial nephritis, not specified as acute or chronic: Secondary | ICD-10-CM | POA: Diagnosis present

## 2020-03-31 DIAGNOSIS — Z20822 Contact with and (suspected) exposure to covid-19: Secondary | ICD-10-CM | POA: Diagnosis present

## 2020-03-31 DIAGNOSIS — K760 Fatty (change of) liver, not elsewhere classified: Secondary | ICD-10-CM | POA: Diagnosis present

## 2020-03-31 DIAGNOSIS — I110 Hypertensive heart disease with heart failure: Secondary | ICD-10-CM | POA: Diagnosis present

## 2020-03-31 DIAGNOSIS — K219 Gastro-esophageal reflux disease without esophagitis: Secondary | ICD-10-CM | POA: Diagnosis present

## 2020-03-31 DIAGNOSIS — G4733 Obstructive sleep apnea (adult) (pediatric): Secondary | ICD-10-CM | POA: Diagnosis present

## 2020-03-31 DIAGNOSIS — Z6841 Body Mass Index (BMI) 40.0 and over, adult: Secondary | ICD-10-CM | POA: Diagnosis not present

## 2020-03-31 DIAGNOSIS — Z7902 Long term (current) use of antithrombotics/antiplatelets: Secondary | ICD-10-CM | POA: Diagnosis not present

## 2020-03-31 DIAGNOSIS — J9601 Acute respiratory failure with hypoxia: Secondary | ICD-10-CM | POA: Diagnosis present

## 2020-03-31 DIAGNOSIS — E785 Hyperlipidemia, unspecified: Secondary | ICD-10-CM | POA: Diagnosis present

## 2020-03-31 DIAGNOSIS — Z79899 Other long term (current) drug therapy: Secondary | ICD-10-CM | POA: Diagnosis not present

## 2020-03-31 DIAGNOSIS — I5032 Chronic diastolic (congestive) heart failure: Secondary | ICD-10-CM | POA: Diagnosis present

## 2020-03-31 DIAGNOSIS — E7800 Pure hypercholesterolemia, unspecified: Secondary | ICD-10-CM | POA: Diagnosis present

## 2020-03-31 LAB — CBC WITH DIFFERENTIAL/PLATELET
Abs Immature Granulocytes: 0.03 10*3/uL (ref 0.00–0.07)
Basophils Absolute: 0 10*3/uL (ref 0.0–0.1)
Basophils Relative: 0 %
Eosinophils Absolute: 0.1 10*3/uL (ref 0.0–0.5)
Eosinophils Relative: 1 %
HCT: 38.5 % (ref 36.0–46.0)
Hemoglobin: 12.2 g/dL (ref 12.0–15.0)
Immature Granulocytes: 0 %
Lymphocytes Relative: 5 %
Lymphs Abs: 0.4 10*3/uL — ABNORMAL LOW (ref 0.7–4.0)
MCH: 29.3 pg (ref 26.0–34.0)
MCHC: 31.7 g/dL (ref 30.0–36.0)
MCV: 92.5 fL (ref 80.0–100.0)
Monocytes Absolute: 0.6 10*3/uL (ref 0.1–1.0)
Monocytes Relative: 7 %
Neutro Abs: 6.8 10*3/uL (ref 1.7–7.7)
Neutrophils Relative %: 87 %
Platelets: 152 10*3/uL (ref 150–400)
RBC: 4.16 MIL/uL (ref 3.87–5.11)
RDW: 13.5 % (ref 11.5–15.5)
WBC: 7.9 10*3/uL (ref 4.0–10.5)
nRBC: 0 % (ref 0.0–0.2)

## 2020-03-31 LAB — COMPREHENSIVE METABOLIC PANEL
ALT: 22 U/L (ref 0–44)
AST: 30 U/L (ref 15–41)
Albumin: 2.9 g/dL — ABNORMAL LOW (ref 3.5–5.0)
Alkaline Phosphatase: 39 U/L (ref 38–126)
Anion gap: 9 (ref 5–15)
BUN: 11 mg/dL (ref 8–23)
CO2: 26 mmol/L (ref 22–32)
Calcium: 8.5 mg/dL — ABNORMAL LOW (ref 8.9–10.3)
Chloride: 103 mmol/L (ref 98–111)
Creatinine, Ser: 0.77 mg/dL (ref 0.44–1.00)
GFR, Estimated: >60 mL/min (ref >60)
Glucose, Bld: 123 mg/dL — ABNORMAL HIGH (ref 70–99)
Potassium: 3.3 mmol/L — ABNORMAL LOW (ref 3.5–5.1)
Sodium: 138 mmol/L (ref 135–145)
Total Bilirubin: 0.8 mg/dL (ref 0.3–1.2)
Total Protein: 5.8 g/dL — ABNORMAL LOW (ref 6.5–8.1)

## 2020-03-31 LAB — URINE CULTURE: Culture: <10000 — AB

## 2020-03-31 MED ORDER — POTASSIUM CHLORIDE CRYS ER 20 MEQ PO TBCR
40.0000 meq | EXTENDED_RELEASE_TABLET | Freq: Once | ORAL | Status: AC
  Administered 2020-03-31: 40 meq via ORAL
  Filled 2020-03-31: qty 2

## 2020-03-31 MED ORDER — ACETAMINOPHEN 325 MG PO TABS
650.0000 mg | ORAL_TABLET | Freq: Four times a day (QID) | ORAL | Status: DC | PRN
  Administered 2020-03-31 – 2020-04-02 (×5): 650 mg via ORAL
  Filled 2020-03-31 (×5): qty 2

## 2020-03-31 MED ORDER — VITAMIN D 25 MCG (1000 UNIT) PO TABS
1000.0000 [IU] | ORAL_TABLET | Freq: Every day | ORAL | Status: DC
  Administered 2020-03-31 – 2020-04-01 (×2): 1000 [IU] via ORAL
  Filled 2020-03-31 (×3): qty 1

## 2020-03-31 MED ORDER — ALBUTEROL SULFATE (2.5 MG/3ML) 0.083% IN NEBU
2.5000 mg | INHALATION_SOLUTION | Freq: Three times a day (TID) | RESPIRATORY_TRACT | Status: DC
  Administered 2020-03-31: 2.5 mg via RESPIRATORY_TRACT
  Filled 2020-03-31: qty 3

## 2020-03-31 MED ORDER — PANTOPRAZOLE SODIUM 40 MG PO TBEC
40.0000 mg | DELAYED_RELEASE_TABLET | Freq: Every day | ORAL | Status: DC
  Administered 2020-03-31 – 2020-04-01 (×2): 40 mg via ORAL
  Filled 2020-03-31 (×3): qty 1

## 2020-03-31 MED ORDER — ROSUVASTATIN CALCIUM 20 MG PO TABS
20.0000 mg | ORAL_TABLET | Freq: Every day | ORAL | Status: DC
  Administered 2020-03-31 – 2020-04-01 (×2): 20 mg via ORAL
  Filled 2020-03-31 (×3): qty 1

## 2020-03-31 MED ORDER — ONDANSETRON HCL 4 MG/2ML IJ SOLN: 4.0000 mg | Freq: Four times a day (QID) | INTRAMUSCULAR | Status: DC | PRN

## 2020-03-31 MED ORDER — ADULT MULTIVITAMIN W/MINERALS CH
1.0000 | ORAL_TABLET | Freq: Every day | ORAL | Status: DC
  Administered 2020-03-31 – 2020-04-01 (×2): 1 via ORAL
  Filled 2020-03-31 (×3): qty 1

## 2020-03-31 MED ORDER — CLOPIDOGREL BISULFATE 75 MG PO TABS
75.0000 mg | ORAL_TABLET | Freq: Every day | ORAL | Status: DC
  Administered 2020-04-01: 75 mg via ORAL
  Filled 2020-03-31 (×3): qty 1

## 2020-03-31 MED ORDER — ENOXAPARIN SODIUM 40 MG/0.4ML ~~LOC~~ SOLN
40.0000 mg | SUBCUTANEOUS | Status: DC
  Administered 2020-03-31 – 2020-04-01 (×2): 40 mg via SUBCUTANEOUS
  Filled 2020-03-31 (×2): qty 0.4

## 2020-03-31 MED ORDER — ALBUTEROL SULFATE (2.5 MG/3ML) 0.083% IN NEBU
INHALATION_SOLUTION | RESPIRATORY_TRACT | Status: AC
  Administered 2020-03-31: 2.5 mg via RESPIRATORY_TRACT
  Filled 2020-03-31: qty 3

## 2020-03-31 MED ORDER — ACETAMINOPHEN 500 MG PO TABS
1000.0000 mg | ORAL_TABLET | Freq: Once | ORAL | Status: AC
  Administered 2020-03-31: 1000 mg via ORAL
  Filled 2020-03-31: qty 2

## 2020-03-31 MED ORDER — ALBUTEROL SULFATE (2.5 MG/3ML) 0.083% IN NEBU: 2.5000 mg | INHALATION_SOLUTION | Freq: Two times a day (BID) | RESPIRATORY_TRACT | Status: DC

## 2020-03-31 MED ORDER — FLUTICASONE PROPIONATE 50 MCG/ACT NA SUSP
1.0000 | Freq: Every day | NASAL | Status: DC
  Administered 2020-03-31 – 2020-04-02 (×3): 1 via NASAL
  Filled 2020-03-31: qty 16

## 2020-03-31 NOTE — H&P (Addendum)
History and Physical  Brittany Middleton CBJ:[id number] DOB: 05-23-1951 DOA: 03/30/2020  Referring physician: Dr. Marlowe Sax  PCP: Maurice Small, MD  Outpatient Specialists: Cardiology Patient coming from: Home through [facility] Chief Complaint: Left flank pain  HPI: Brittany Middleton is a 69 y.o. female with medical history significant for chronic diastolic CHF, UTI 2 years ago, essential hypertension, hyperlipidemia, coronary artery aneurysm who presented to [facility] ED with complaints of dysuria, left flank pain, nausea, and vomiting.  Patient was previously seen at urgent care on Saturday 3 days ago where she was diagnosed with UTI and prescribed Macrobid.  The day prior to this diagnosis she was experiencing dysuria and felt that she was having a urinary tract infection.  Had a temperature of 103.6 at home after she left urgent care and took 2 doses of Macrobid.  Associated with nausea, vomiting and left flank pain.  Symptoms worsened on Sunday, the day of presentation, and she decided to come to the ED for further evaluation.  Patient was transferred to [facility] from [facility] ED.  She was started on Rocephin empirically.  Urine culture grew less than 10,000 colonies, insignificant growth, blood cultures negative to date.  At the time of this visit patient symptomatology has improved, left flank pain is persistent but less severe.  CT renal negative for hydronephrosis or renal stones, no evidence of perinephric stranding.  Suspected cystitis on CT scan.  Admitted by Dr. Marlowe Sax due to concern for UTI and Left pyelonephritis.  Additionally, reports chronic non productive cough for years and exertional dyspnea recently.  Not on home PPI to suggest GERD.   ED Course: Direct admission from [facility].  Review of Systems: Review of systems as noted in the HPI. All other systems reviewed and are negative.   Past Medical History:  Diagnosis Date  . Cervical disc disease    MRI 2016  .  Hepatic steatosis    per imaging 2016, 2019  . Hiatal hernia   . History of palpitations    Holter monitor, 2017: NSR, occasional PVC, PACs, no arrhythmia  . Hypercholesteremia   . Hypertension   . Lumbar disc disease    MRI, 2017    Past Surgical History:  Procedure Laterality Date  . BLADDER REPAIR    . BREAST EXCISIONAL BIOPSY Left   . CARPAL TUNNEL RELEASE Right   . CATARACT EXTRACTION, BILATERAL  2014  . COLONOSCOPY WITH PROPOFOL N/A 07/18/2018   Procedure: COLONOSCOPY WITH PROPOFOL;  Surgeon: Laurence Spates, MD;  Location: [place];  Service: Endoscopy;  Laterality: N/A;  . ESOPHAGOGASTRODUODENOSCOPY (EGD) WITH PROPOFOL N/A 07/17/2018   Procedure: ESOPHAGOGASTRODUODENOSCOPY (EGD) WITH PROPOFOL;  Surgeon: Laurence Spates, MD;  Location: [place];  Service: Endoscopy;  Laterality: N/A;  . REPAIR RECTOCELE      Social History:  reports that she has never smoked. She has never used smokeless tobacco. She reports that she does not drink alcohol and does not use drugs.   Allergies  Allergen Reactions  . Ciprofloxacin     PAIN IN HANDS   . Codeine Nausea And Vomiting    Family History  Problem Relation Age of Onset  . CAD Mother   . Breast cancer Mother 74      Prior to Admission medications   Medication Sig Start Date End Date Taking? Authorizing Provider  acetaminophen (TYLENOL) 500 MG tablet Take 1,000 mg by mouth every 6 (six) hours as needed for mild pain, moderate pain, fever or headache.  [provider]  cholecalciferol (VITAMIN D) 1000 UNITS tablet Take 1,000 Units by mouth daily.    [provider]  clopidogrel (PLAVIX) 75 MG tablet Take 1 tablet (75 mg total) by mouth daily. 06/28/19   Revankar, [name], MD  Coenzyme Q10 (CO Q 10 PO) Take 1 tablet by mouth daily.    [provider]  ELDERBERRY PO Take 125 mg by mouth.    [provider]  IRON PO Take 1 tablet by mouth every other day.    [provider]  Javier Docker  Oil 300 MG CAPS Take 1 capsule by mouth daily.    [provider]  lisinopril-hydrochlorothiazide (PRINZIDE,ZESTORETIC) 20-25 MG per tablet Take 1 tablet by mouth daily.    [provider]  Melatonin 5 MG CHEW Chew 5 mg by mouth at bedtime. Patient not taking: Reported on 12/21/2019    [provider]  metoprolol tartrate (LOPRESSOR) 50 MG tablet Take 2 tablets (100 mg total) by mouth once for 1 dose. TAKE 2 Tablets 2 hours prior to procedure if HR is 55 or greater 05/23/19 05/23/19  Revankar, [name], MD  Multiple Vitamins-Minerals (MULTIVITAMIN WITH MINERALS) tablet Take 1 tablet by mouth daily.    [provider]  rosuvastatin (CRESTOR) 20 MG tablet Take 1 tablet (20 mg total) by mouth daily. 12/25/19 03/24/20  Revankar, [name], MD  vitamin E 180 MG (400 UNITS) capsule Take by mouth. Patient not taking: Reported on 12/21/2019    [provider]    Physical Exam: BP (!) 118/56 (BP Location: Left Arm)   Pulse (!) 55   Temp 98.7 F (37.1 C) (Oral)   Resp (!) 21   Ht 5\' 5"  (1.651 m)   Wt 121.7 kg   SpO2 99%   BMI 44.63 kg/m   . General: 69 y.o. year-old female well developed well nourished in no acute distress.  Alert and oriented x3. . Cardiovascular: Regular rate and rhythm with no rubs or gallops.  No thyromegaly or JVD noted.  No lower extremity edema. 2/4 pulses in all 4 extremities. Marland Kitchen Respiratory: Mild wheezing noted bilaterally. Good inspiratory effort. . Abdomen: Soft nontender nondistended with normal bowel sounds x4 quadrants.  Left flank pain reproduced on palpation. . Muskuloskeletal: No cyanosis, clubbing or edema noted bilaterally . Neuro: CN II-XII intact, strength, sensation, reflexes . Skin: No ulcerative lesions noted or rashes . Psychiatry: Judgement and insight appear normal. Mood is appropriate for condition and setting          Labs on Admission:  Basic Metabolic Panel: Recent Labs  Lab 03/30/20 1250  NA 134*  K 3.5   CL 98  CO2 25  GLUCOSE 143*  BUN 14  CREATININE 0.78  CALCIUM 8.8*   Liver Function Tests: Recent Labs  Lab 03/30/20 1250  AST 25  ALT 19  ALKPHOS 50  BILITOT 1.2  PROT 7.1  ALBUMIN 3.9   Recent Labs  Lab 03/30/20 1250  LIPASE 31   No results for input(s): AMMONIA in the last 168 hours. CBC: Recent Labs  Lab 03/30/20 1250  WBC 10.8*  NEUTROABS 10.0*  HGB 13.8  HCT 41.8  MCV 90.5  PLT 186   Cardiac Enzymes: No results for input(s): CKTOTAL, CKMB, CKMBINDEX, TROPONINI in the last 168 hours.  BNP (last 3 results) Recent Labs    03/30/20 1708  BNP 95.5    ProBNP (last 3 results) No results for input(s): PROBNP in the last 8760 hours.  CBG: No results for input(s): GLUCAP in the last 168 hours.  Radiological Exams on Admission: CT Angio Chest PE W/Cm &/Or Wo Cm  Result Date: 03/30/2020 CLINICAL DATA:  Shortness of breath, leukocytosis, on antibiotics for UTI EXAM: CT ANGIOGRAPHY CHEST WITH CONTRAST TECHNIQUE: Multidetector CT imaging of the chest was performed using the standard protocol during bolus administration of intravenous contrast. Multiplanar CT image reconstructions and MIPs were obtained to evaluate the vascular anatomy. CONTRAST:  55mL OMNIPAQUE IOHEXOL 350 MG/ML SOLN COMPARISON:  Coronary CT 06/25/2019 FINDINGS: Cardiovascular: Satisfactory opacification the pulmonary arteries to the segmental level. No pulmonary artery filling defects are identified. Central pulmonary arteries are normal caliber. The aortic root is suboptimally assessed given cardiac pulsation artifact. Atherosclerotic plaque within the normal caliber aorta. Shared origin of the brachiocephalic and left common carotid arteries. Aberrant origin of the right subclavian artery as well. Proximal great vessels are otherwise unremarkable. Slight reflux of contrast into the IVC and hepatic veins. Normal heart size. No pericardial effusion. Few scattered coronary artery calcifications,  predominantly within the LAD. Mediastinum/Nodes: There are several clustered, enlarged subcarinal and right hilar nodes measuring up to 16 mm (4/41) as well as a 10 mm left hilar lymph node (4/42). No concerning axillary adenopathy. No mediastinal fluid or gas. Normal thyroid gland and thoracic inlet. No acute abnormality of the trachea or esophagus. Lungs/Pleura: Low volumes and atelectasis dependently likely accentuated by imaging during exhalation. There is some mild apical predominant interlobular septal thickening as well as fissural thickening more pronounced on the right than left. Slight vascular cephalization is noted as well. No focal consolidative opacity. No pneumothorax or visible effusion. Additional bandlike opacities in the lung bases likely reflecting areas of scarring and or atelectasis. No concerning pulmonary nodules or masses. Upper Abdomen: No acute abnormalities present in the visualized portions of the upper abdomen. Likely hepatomegaly. Musculoskeletal: No acute osseous abnormality or suspicious osseous lesion. Exaggerated thoracic kyphosis. Increased AP diameter of the chest. Multilevel degenerative changes are present in the imaged portions of the spine. Multilevel flowing anterior osteophytosis, compatible with features of diffuse idiopathic skeletal hyperostosis (DISH). No worrisome chest wall lesions. Review of the MIP images confirms the above findings. IMPRESSION: 1. No evidence of acute pulmonary artery filling defects to suggest pulmonary embolism. 2. Low volumes and atelectasis dependently likely accentuated by imaging during exhalation. 3. Interlobular septal and fissural thickening with cephalized vascularity, suggestive of mild interstitial edema. 4. Slight reflux of contrast into the IVC and hepatic veins, can be seen with right heart failure/volume overload. 5. Several clustered, enlarged subcarinal and right hilar nodes, nonspecific, though possibly reactive or edematous in  etiology. 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 7. Hepatomegaly. 8. Aortic Atherosclerosis (ICD10-I70.0). Electronically Signed   By: Lovena Le M.D.   On: 03/30/2020 19:00   DG Chest Portable 1 View  Result Date: 03/30/2020 CLINICAL DATA:  Abdominal pain and shortness of breath. EXAM: PORTABLE CHEST 1 VIEW COMPARISON:  Chest radiograph 04/05/2019 FINDINGS: Normal cardiac and mediastinal contours. Minimal right basilar heterogeneous opacities. No pleural effusion or pneumothorax. IMPRESSION: Minimal right basilar atelectasis. Electronically Signed   By: Lovey Newcomer M.D.   On: 03/30/2020 13:57   CT Renal Stone Study  Result Date: 03/30/2020 CLINICAL DATA:  Recent diagnosis of urinary tract infection. Shortness of breath and dysuria. Flank pain. Kidney stone suspected. EXAM: CT ABDOMEN AND PELVIS WITHOUT CONTRAST TECHNIQUE: Multidetector CT imaging of the abdomen and pelvis was performed following the standard protocol without IV contrast.  COMPARISON:  Prior CT 10/31/2014. FINDINGS: Lower chest: Mild dependent atelectasis at both lung bases. Mild coronary artery atherosclerosis. No significant pleural or pericardial effusion. Hepatobiliary: Generalized decreased hepatic density consistent with steatosis. No focal abnormalities are identified on noncontrast imaging. No evidence of gallstones, gallbladder wall thickening or biliary dilatation. Pancreas: Unremarkable. No pancreatic ductal dilatation or surrounding inflammatory changes. Spleen: Normal in size without focal abnormality. Adrenals/Urinary Tract: Both adrenal glands appear normal. Both kidneys appear unremarkable. There is no evidence of urinary tract calculus, hydronephrosis, perinephric soft tissue stranding or fluid collection. No focal renal abnormalities are identified. There is possible mild bladder wall thickening and possible mild perivesical inflammatory change. Stomach/Bowel: No evidence of bowel wall thickening, distention  or surrounding inflammatory change. The stomach is incompletely distended. The appendix appears normal. Moderate stool throughout the colon. Scattered diverticulosis of the descending and sigmoid colon. Vascular/Lymphatic: There are no enlarged abdominal or pelvic lymph nodes. Mild aortic and branch vessel atherosclerosis. Reproductive: The uterus and ovaries appear normal. No adnexal mass. Other: No evidence of abdominal wall mass or hernia. No ascites. Musculoskeletal: No acute or significant osseous findings. Multilevel spondylosis again noted. IMPRESSION: 1. Possible mild bladder wall thickening and perivesical inflammatory change, suggesting possible cystitis. Correlation with urine analysis recommended. 2. No evidence of urinary tract calculus or hydronephrosis. No perinephric inflammatory changes. 3. Hepatic steatosis. 4. Aortic Atherosclerosis (ICD10-I70.0). Electronically Signed   By: Richardean Sale M.D.   On: 03/30/2020 16:05    EKG: I independently viewed the EKG done and my findings are as followed: Sinus rhythm rate of 89.  Assessment/Plan Present on Admission: . Pyelonephritis  Active Problems:   Pyelonephritis  Presumed pyelonephritis, clinically diagnosed Patient presented from home with significant left flank pain, fever 103.6 at home, diagnosis of urinary tract infection outpatient and started on Macrobid on Saturday AM at urgent care. No evidence of perinephric stranding on CT scan Urine culture taken on 03/30/2020 less than 10,000 colonies, insignificant growth Blood cultures x2 taken on 03/30/2020 - to date. Currently on Rocephin started by admitting provider, continue x 2 days Symptomatology is improving Continue to follow blood cultures  Chronic non productive cough/ exertional dyspnea/ wheezing States non productive cough has been ongoing for years Concern for possible underlying asthma Albuterol neb TID Also starting a trial of Protonix 40 mg po daily. Pulmonary  consulted  Resolved Acute hypoxic respiratory failure, likely multifactorial secondary to atelectasis versus others Presented with O2 sat 87% RA, now improved to 91% on RA Diffuse wheezing on exam Personally reviewed chest x-ray on admission showing bilateral bibasilar atelectasis Start incentive spirometer and flutter valve Start bronchodilators as needed Recommend pulmonary follow-up for possible PFT outpatient  OSA on CPAP at home Resume CPAP at night  Essential hypertension, BPs are currently soft Hold off home antihypertensives for now due to soft Bps Monitor vital signs   DVT prophylaxis: Subcu Lovenox daily  Code Status: Full code per the patient was self  Family Communication: None at bedside  Disposition Plan: Admitted to telemetry medical  Consults called: Pulmonary  Admission status: Inpatient status   Status is: Inpatient    Dispo:  Patient From: Home  Planned Disposition: Home  Expected discharge date: 04/02/20  Medically stable for discharge: No, ongoing management of UTI and left flank pain.        Kayleen Memos MD [facility] Pager [phone]  If 7PM-7AM, please contact night-coverage [email or web address] Password [email or web address]  03/31/2020, 1:00 PM

## 2020-03-31 NOTE — Progress Notes (Signed)
Patient arrived to room 5M05 in NAD, VS stable and patient free from pain. Patient oriented to room, call bell in reach.

## 2020-03-31 NOTE — Consult Note (Signed)
NAME:  Brittany Middleton, MRN:  [id number], DOB:  [birth year], LOS: 0 ADMISSION DATE:  03/30/2020, CONSULTATION DATE:  04/01/2011 REFERRING MD:  Dr. Nevada Crane, CHIEF COMPLAINT: Dyspnea  Brief History   69yo female presented with complaints of UTI symptoms and later developed progressive dyspnea and intermittent cough.    History of present illness   Brittany Middleton is a 69yo female who initially came in for complaints of dysuria. Left flank pain, nausea, and vomiting. She was admitted by the hospitlait service for treatment of presumed pyelonephritis.  PCCM was consulted 10/11 for complaints of worsened fatigue, chronic cough, dyspnea, and intermittent wheezing. She states that her biggest complaint Is the increased fatigue and exertional dyspnea. She reports she noticed these have worsened over the last two months. She denies any, chest pain, orthopnea, or lower extremity swelling. On review of history she reports two prior episodes of pneumonia since 1990 both required hospitalization. She denies any tobacco use but reports significant second hand exposure. She denies any occupational exposures. She has never seen a pulmonologist or had any pulmonary workup.   Past Medical History  UTI HTN HLD Hiatal hernia  Cervical and lumbar disk disease   Significant Hospital Events   Admitted 10/11  Consults:  PCCM  Procedures:    Significant Diagnostic Tests:  CXR 10/10 > Minimal right basilar atelectasis.  CT renal stone study 10/10 >  1. Possible mild bladder wall thickening and perivesical inflammatory change, suggesting possible cystitis. Correlation with urine analysis recommended. 2. No evidence of urinary tract calculus or hydronephrosis. No perinephric inflammatory changes. 3. Hepatic steatosis. 4. Aortic Atherosclerosis (ICD10-I70.0).  CT angio chest 10/10 > 1. No evidence of acute pulmonary artery filling defects to suggest pulmonary embolism. 2. Low volumes and atelectasis  dependently likely accentuated by imaging during exhalation. 3. Interlobular septal and fissural thickening with cephalized vascularity, suggestive of mild interstitial edema. 4. Slight reflux of contrast into the IVC and hepatic veins, can be seen with right heart failure/volume overload. 5. Several clustered, enlarged subcarinal and right hilar nodes, nonspecific, though possibly reactive or edematous in etiology. 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 7. Hepatomegaly. 8. Aortic Atherosclerosis (ICD10-I70.0).  Micro Data:  COVID 10/10 > Negative  Urine culture 10/10 > Insignificant growth  Blood culture 10/10 >   Antimicrobials:  Ceftriaxone 10/10 >  Interim history/subjective:  Sitting up in bed in no acute distress, she reports continued exertional dyspnea  Objective   Blood pressure (!) 118/56, pulse (!) 55, temperature 98.7 F (37.1 C), temperature source Oral, resp. rate (!) 21, height 5\' 5"  (1.651 m), weight 121.7 kg, SpO2 94 %.        Intake/Output Summary (Last 24 hours) at 03/31/2020 1633 Last data filed at 03/31/2020 1300 Gross per 24 hour  Intake 240 ml  Output 300 ml  Net -60 ml   Filed Weights   03/30/20 1230 03/31/20 1152  Weight: 120.2 kg 121.7 kg    Examination: General: Pleasant elderly female in no acute distress, lying in bed in NAD HEENT: [place]/AT, MM pink/moist, PERRL,  Neuro: Alert and oriented x3, non-focal  CV: s1s2 regular rate and rhythm, no murmur, rubs, or gallops,  PULM:  Clear to ascultation, no increased work of breathing, mild exertional dyspnea, faint posterior crackles  GI: soft, bowel sounds active in all 4 quadrants, non-tender, non-distended Extremities: warm/dry, no edema  Skin: no rashes or lesions   Resolved Hospital Problem list     Assessment & Plan:  Exertional  dyspnea with chronic cough and wheeze  -Complaints of worsened fatigue, chronic cough, dyspnea, and intermittent wheezing. She states that her  biggest complaint Is the increased fatigue and exertional dyspnea. P: Trial of PRN bronchodilators  Encourage mobilization as able  Can consider repeat ECHO Continue PPI can consider increase frequency  Would benefit from pulmonary follow up at discharge   Best practice:  Diet: Cardiac  Pain/Anxiety/Delirium protocol (if indicated): PRN VAP protocol (if indicated): N/A DVT prophylaxis: lovenox  GI prophylaxis: PPI Glucose control: Monitor  Mobility: Mobilize as able Code Status: Full Family Communication: Per family  Disposition: Floor   Labs   CBC: Recent Labs  Lab 03/30/20 1250 03/31/20 1313  WBC 10.8* 7.9  NEUTROABS 10.0* 6.8  HGB 13.8 12.2  HCT 41.8 38.5  MCV 90.5 92.5  PLT 186 502    Basic Metabolic Panel: Recent Labs  Lab 03/30/20 1250 03/31/20 1313  NA 134* 138  K 3.5 3.3*  CL 98 103  CO2 25 26  GLUCOSE 143* 123*  BUN 14 11  CREATININE 0.78 0.77  CALCIUM 8.8* 8.5*   GFR: Estimated Creatinine Clearance: 89.3 mL/min (by C-G formula based on SCr of 0.77 mg/dL). Recent Labs  Lab 03/30/20 1250 03/30/20 1456 03/31/20 1313  WBC 10.8*  --  7.9  LATICACIDVEN 1.4 1.4  --     Liver Function Tests: Recent Labs  Lab 03/30/20 1250 03/31/20 1313  AST 25 30  ALT 19 22  ALKPHOS 50 39  BILITOT 1.2 0.8  PROT 7.1 5.8*  ALBUMIN 3.9 2.9*   Recent Labs  Lab 03/30/20 1250  LIPASE 31   No results for input(s): AMMONIA in the last 168 hours.  ABG No results found for: PHART, PCO2ART, PO2ART, HCO3, TCO2, ACIDBASEDEF, O2SAT   Coagulation Profile: No results for input(s): INR, PROTIME in the last 168 hours.  Cardiac Enzymes: No results for input(s): CKTOTAL, CKMB, CKMBINDEX, TROPONINI in the last 168 hours.  HbA1C: No results found for: HGBA1C  CBG: No results for input(s): GLUCAP in the last 168 hours.  Review of Systems:   Gen: Denies fever, chills, weight change, fatigue, night sweats HEENT: Denies blurred vision, double vision, hearing  loss, tinnitus, sinus congestion, rhinorrhea, sore throat, neck stiffness, dysphagia PULM: Denies shortness of breath, cough, sputum production, hemoptysis, wheezing CV: Denies chest pain, edema, orthopnea, paroxysmal nocturnal dyspnea, palpitations GI: Denies abdominal pain, nausea, vomiting, diarrhea, hematochezia, melena, constipation, change in bowel habits GU: Denies dysuria, hematuria, polyuria, oliguria, urethral discharge Endocrine: Denies hot or cold intolerance, polyuria, polyphagia or appetite change Derm: Denies rash, dry skin, scaling or peeling skin change Heme: Denies easy bruising, bleeding, bleeding gums Neuro: Denies headache, numbness, weakness, slurred speech, loss of memory or consciousness  Past Medical History  She,  has a past medical history of Cervical disc disease, Hepatic steatosis, Hiatal hernia, History of palpitations, Hypercholesteremia, Hypertension, Lumbar disc disease, and UTI (urinary tract infection).   Surgical History    Past Surgical History:  Procedure Laterality Date  . BLADDER REPAIR    . BREAST EXCISIONAL BIOPSY Left   . CARPAL TUNNEL RELEASE Right   . CATARACT EXTRACTION, BILATERAL  2014  . COLONOSCOPY WITH PROPOFOL N/A 07/18/2018   Procedure: COLONOSCOPY WITH PROPOFOL;  Surgeon: Laurence Spates, MD;  Location: [place];  Service: Endoscopy;  Laterality: N/A;  . ESOPHAGOGASTRODUODENOSCOPY (EGD) WITH PROPOFOL N/A 07/17/2018   Procedure: ESOPHAGOGASTRODUODENOSCOPY (EGD) WITH PROPOFOL;  Surgeon: Laurence Spates, MD;  Location: [place];  Service: Endoscopy;  Laterality: N/A;  .  REPAIR RECTOCELE       Social History   reports that she has never smoked. She has never used smokeless tobacco. She reports that she does not drink alcohol and does not use drugs.   Family History   Her family history includes Breast cancer (age of onset: 20) in her mother; CAD in her mother.   Allergies Allergies  Allergen Reactions  . Ciprofloxacin Other  (See Comments)    Caused PAIN IN HANDS   . Codeine Nausea And Vomiting     Home Medications  Prior to Admission medications   Medication Sig Start Date End Date Taking? Authorizing Provider  acetaminophen (TYLENOL) 500 MG tablet Take 1,000 mg by mouth every 6 (six) hours as needed for mild pain, moderate pain, fever or headache.   Yes [provider]  BIOTIN PO Take 1 tablet by mouth daily.   Yes [provider]  cephALEXin (KEFLEX) 500 MG capsule Take 500 mg by mouth 2 (two) times daily.   Yes [provider]  Cholecalciferol (VITAMIN D-3) 25 MCG (1000 UT) CAPS Take 1,000 Units by mouth daily.   Yes [provider]  Coenzyme Q10 (CO Q 10 PO) Take 1 capsule by mouth daily.    Yes [provider]  IRON PO Take 1 tablet by mouth 2 (two) times a week.    Yes [provider]  Javier Docker Oil 300 MG CAPS Take 300 mg by mouth daily.    Yes [provider]  lisinopril-hydrochlorothiazide (PRINZIDE,ZESTORETIC) 20-25 MG per tablet Take 1 tablet by mouth daily.   Yes [provider]  Melatonin 5 MG CHEW Chew 5 mg by mouth at bedtime.    Yes [provider]  Multiple Vitamins-Minerals (MULTIVITAMIN WITH MINERALS) tablet Take 1 tablet by mouth daily.   Yes [provider]  omeprazole (PRILOSEC) 40 MG capsule Take 40 mg by mouth daily as needed (for GERD-like symptoms).   Yes [provider]  phenazopyridine (PYRIDIUM) 200 MG tablet Take 200 mg by mouth 3 (three) times daily as needed for pain.   Yes [provider]  rosuvastatin (CRESTOR) 20 MG tablet Take 1 tablet (20 mg total) by mouth daily. Patient taking differently: Take 20 mg by mouth daily after supper.  12/25/19 03/31/20 Yes Revankar, [name], MD  clopidogrel (PLAVIX) 75 MG tablet Take 1 tablet (75 mg total) by mouth daily. 06/28/19   Revankar, [name], MD  ELDERBERRY PO Take 125 mg by mouth. Patient not taking: Reported on 03/31/2020    [provider]  metoprolol tartrate (LOPRESSOR) 50 MG tablet Take 2 tablets (100 mg total) by mouth once for 1 dose. TAKE 2 Tablets 2 hours prior to procedure if HR is 55 or greater Patient not taking: Reported on 03/31/2020 05/23/19 03/31/20  Revankar, [name], MD  vitamin E 180 MG (400 UNITS) capsule Take by mouth.  Patient not taking: Reported on 03/31/2020    [provider]     Signature:   Johnsie Cancel, NP-C [facility] Contact / Pager information can be found on Amion  03/31/2020, 5:07 PM

## 2020-04-01 ENCOUNTER — Inpatient Hospital Stay (HOSPITAL_COMMUNITY): Payer: Medicare Other

## 2020-04-01 LAB — CBC WITH DIFFERENTIAL/PLATELET
Abs Immature Granulocytes: 0.02 10*3/uL (ref 0.00–0.07)
Basophils Absolute: 0 10*3/uL (ref 0.0–0.1)
Basophils Relative: 1 %
Eosinophils Absolute: 0.5 10*3/uL (ref 0.0–0.5)
Eosinophils Relative: 9 %
HCT: 37.5 % (ref 36.0–46.0)
Hemoglobin: 11.8 g/dL — ABNORMAL LOW (ref 12.0–15.0)
Immature Granulocytes: 0 %
Lymphocytes Relative: 13 %
Lymphs Abs: 0.7 10*3/uL (ref 0.7–4.0)
MCH: 29.8 pg (ref 26.0–34.0)
MCHC: 31.5 g/dL (ref 30.0–36.0)
MCV: 94.7 fL (ref 80.0–100.0)
Monocytes Absolute: 0.6 10*3/uL (ref 0.1–1.0)
Monocytes Relative: 11 %
Neutro Abs: 3.6 10*3/uL (ref 1.7–7.7)
Neutrophils Relative %: 66 %
Platelets: 154 10*3/uL (ref 150–400)
RBC: 3.96 MIL/uL (ref 3.87–5.11)
RDW: 13.5 % (ref 11.5–15.5)
WBC: 5.5 10*3/uL (ref 4.0–10.5)
nRBC: 0 % (ref 0.0–0.2)

## 2020-04-01 LAB — COMPREHENSIVE METABOLIC PANEL
ALT: 25 U/L (ref 0–44)
AST: 30 U/L (ref 15–41)
Albumin: 2.9 g/dL — ABNORMAL LOW (ref 3.5–5.0)
Alkaline Phosphatase: 42 U/L (ref 38–126)
Anion gap: 9 (ref 5–15)
BUN: 7 mg/dL — ABNORMAL LOW (ref 8–23)
CO2: 28 mmol/L (ref 22–32)
Calcium: 8.6 mg/dL — ABNORMAL LOW (ref 8.9–10.3)
Chloride: 104 mmol/L (ref 98–111)
Creatinine, Ser: 0.79 mg/dL (ref 0.44–1.00)
GFR, Estimated: >60 mL/min (ref >60)
Glucose, Bld: 110 mg/dL — ABNORMAL HIGH (ref 70–99)
Potassium: 3.9 mmol/L (ref 3.5–5.1)
Sodium: 141 mmol/L (ref 135–145)
Total Bilirubin: 0.8 mg/dL (ref 0.3–1.2)
Total Protein: 6 g/dL — ABNORMAL LOW (ref 6.5–8.1)

## 2020-04-01 LAB — MAGNESIUM: Magnesium: 2.2 mg/dL (ref 1.7–2.4)

## 2020-04-01 MED ORDER — PANTOPRAZOLE SODIUM 40 MG PO TBEC
40.0000 mg | DELAYED_RELEASE_TABLET | Freq: Every day | ORAL | 1 refills | Status: DC
Start: 2020-04-01 — End: 2024-02-28

## 2020-04-01 MED ORDER — FLUTICASONE PROPIONATE 50 MCG/ACT NA SUSP: 1.0000 | Freq: Every day | NASAL | 2 refills | Status: DC

## 2020-04-01 MED ORDER — ALBUTEROL SULFATE (2.5 MG/3ML) 0.083% IN NEBU: 2.5000 mg | INHALATION_SOLUTION | Freq: Three times a day (TID) | RESPIRATORY_TRACT | 12 refills | Status: DC

## 2020-04-01 MED ORDER — ALBUTEROL SULFATE (2.5 MG/3ML) 0.083% IN NEBU: 2.5000 mg | INHALATION_SOLUTION | RESPIRATORY_TRACT | Status: DC | PRN

## 2020-04-01 MED ORDER — LORAZEPAM 2 MG/ML IJ SOLN
0.5000 mg | Freq: Once | INTRAMUSCULAR | Status: AC
  Administered 2020-04-01: 0.5 mg via INTRAVENOUS
  Filled 2020-04-01: qty 1

## 2020-04-01 MED ORDER — CEFDINIR 300 MG PO CAPS: 300.0000 mg | ORAL_CAPSULE | Freq: Two times a day (BID) | ORAL | 0 refills | Status: DC

## 2020-04-01 MED ORDER — ALBUTEROL SULFATE HFA 108 (90 BASE) MCG/ACT IN AERS: 2.0000 | INHALATION_SPRAY | Freq: Four times a day (QID) | RESPIRATORY_TRACT | 2 refills | Status: DC | PRN

## 2020-04-01 NOTE — Progress Notes (Signed)
Patient refused use of CPAP for the evening. States that she will have her CPAP brought in for use if she needs to stay another night.

## 2020-04-01 NOTE — Progress Notes (Addendum)
PROGRESS NOTE    Flora Parks  EQA:[id number] DOB: 05/06/1951 DOA: 03/30/2020 PCP: Maurice Small, MD   Brief Narrative: 69 year old with past medical history significant for chronic diastolic dysfunction, UTI 2 years ago, hypertension, hyperlipidemia coronary artery aneurysm who presented to [place] complaining of dysuria, left flank pain, nausea and vomiting.  Patient was previously evaluated at urgent care on Saturday and she was diagnosed with a UTI and prescribed Macrobid.  She continued to have fever after she left the urgent care, associated with nausea and vomiting and left flank pain.  Her symptoms worsened on Sunday and she presented for further evaluation. Patient was a started on IV ceftriaxone.  CT scan renal protocol was negative for hydronephrosis or renal stone suspected cystitis.  She also report chronic cough and shortness of breath on exertion.  She presented with oxygen saturation at 80%  on room air, diffuse wheezing.  Chest x-ray showed bibasilar atelectasis.  CT angio negative for acute PE, interlobular septal and fissural thickening with cephalitis vascularity, suggestive of mild interstitial edema.  Several cluster, enlarged subcarinal and right hilar nodes nonspecific.  BNP was normal.   Pulmonologist  was consulted, pulmonology thinks patient could have reactive airway diseases, patient will need pulmonary function test as an outpatient, continue with albuterol, also component of GERD and allergies.  Patient is started on Protonix and Flonase.  Patient reports episode of confusion, off balance plan to proceed with MRI of the brain.  B12 level in the morning.  Assessment & Plan:   Active Problems:   Pyelonephritis  1-Pyelonephritis: -Patient presented with flank pain, dysuria, nausea vomiting, fevers -Urine culture insignificant growth but she was already on antibiotics prior to admission. -Continue with IV ceftriaxone today and tomorrow.  Plan to  discharge on cefdinir -Blood cultures: no growth.  -Improved.  2-Chronic nonproductive cough, exertional dyspnea/wheezing -Currently related to asthma, GERD, allergies. -Improved. -Continue with albuterol nebulizer.  I have ordered a nebulizer machine for home. -She will need albuterol inhaler as well for home. -Continue with PPI, Flonase. -Follow-up with pulmonary for pulmonary function test. -Keep off lisinopril discharge.  3-Acute hypoxic respiratory failure: Multifactorial secondary to atelectasis versus component of asthma; -Now on room air. Oxygen  on ambulation remained above 90 %. -Cruciate pulmonologist evaluation. -Continue with nebulizer. -Continue with incentive spirometry.  4-Confusion, balance problem gait problem: -She was confused last night, still not feeling herself. -Having trouble ambulating, balance off. -Went to check MRI of the brain and  B-12.  5-hypertension: Blood pressure continued to be in the normal range.  Continue to hold blood pressure medications.  Morbid obesity; need weight loss.  Estimated body mass index is 44.63 kg/m as calculated from the following:   Height as of this encounter: 5\' 5"  (1.651 m).   Weight as of this encounter: 121.7 kg.   DVT prophylaxis: Lovenox Code Status: Full code Family Communication: Care discussed with patient and son who was at bedside Disposition Plan:  Status is: Inpatient  Remains inpatient appropriate because:Ongoing active pain requiring inpatient pain management and Ongoing diagnostic testing needed not appropriate for outpatient work up   Dispo:  Patient From: Home  Planned Disposition: Home  Expected discharge date: 04/02/2020  Medically stable for discharge: No. Patient with balance problem, confusion, work up in process.       Consultants:   Pulmonologist.   Procedures:   None  Antimicrobials:  Ceftriaxone.   Subjective: She report SOB has improved, cough has improved. She was  able to ambulate to bathroom with less SOB. Doesn't feels at baseline for SOB yet.  Denies flank pain.  She report feeling confuse, had episode of confusion last night she thought she was drinking from straw/cup and try to drink several time but nothing was coming through the straw, she realized she didn't had a cup/straw in her had.  She feels off balance, she mention eye coordination diffculty while ambulating today.  She report headache for last couple days, parietal area.   Objective: Vitals:   03/31/20 2006 03/31/20 2025 04/01/20 0413 04/01/20 0851  BP: (!) 103/50  (!) 125/53 126/60  Pulse: (!) 59 64 65 62  Resp: 20 16 18 18   Temp: 99.5 F (37.5 C)  99.4 F (37.4 C) 97.8 F (36.6 C)  TempSrc: Oral  Oral Oral  SpO2: 95% 95% 94% 96%  Weight:      Height:        Intake/Output Summary (Last 24 hours) at 04/01/2020 1440 Last data filed at 04/01/2020 1300 Gross per 24 hour  Intake 1320 ml  Output --  Net 1320 ml   Filed Weights   03/30/20 1230 03/31/20 1152  Weight: 120.2 kg 121.7 kg    Examination:  General exam: Appears calm and comfortable  Respiratory system: Clear to auscultation. Respiratory effort normal. Cardiovascular system: S1 & S2 heard, RRR. No JVD, murmurs, rubs, gallops or clicks. No pedal edema. Gastrointestinal system: Abdomen is nondistended, soft and nontender. No organomegaly or masses felt. Normal bowel sounds heard. Central nervous system: Alert and oriented. Balance off, alert and oriented, speech is clear, uvula midline, tongue mildly deviated, Upper extremity strengthen 5/5, lower extremity B/L 4-5/5/  EOM intact.  Extremities: symmetric power 4-5/5 LE   Data Reviewed: I have personally reviewed following labs and imaging studies  CBC: Recent Labs  Lab 03/30/20 1250 03/31/20 1313 04/01/20 0237  WBC 10.8* 7.9 5.5  NEUTROABS 10.0* 6.8 3.6  HGB 13.8 12.2 11.8*  HCT 41.8 38.5 37.5  MCV 90.5 92.5 94.7  PLT 186 152 009   Basic Metabolic  Panel: Recent Labs  Lab 03/30/20 1250 03/31/20 1313 04/01/20 0237  NA 134* 138 141  K 3.5 3.3* 3.9  CL 98 103 104  CO2 25 26 28   GLUCOSE 143* 123* 110*  BUN 14 11 7*  CREATININE 0.78 0.77 0.79  CALCIUM 8.8* 8.5* 8.6*  MG  --   --  2.2   GFR: Estimated Creatinine Clearance: 89.3 mL/min (by C-G formula based on SCr of 0.79 mg/dL). Liver Function Tests: Recent Labs  Lab 03/30/20 1250 03/31/20 1313 04/01/20 0237  AST 25 30 30   ALT 19 22 25   ALKPHOS 50 39 42  BILITOT 1.2 0.8 0.8  PROT 7.1 5.8* 6.0*  ALBUMIN 3.9 2.9* 2.9*   Recent Labs  Lab 03/30/20 1250  LIPASE 31   No results for input(s): AMMONIA in the last 168 hours. Coagulation Profile: No results for input(s): INR, PROTIME in the last 168 hours. Cardiac Enzymes: No results for input(s): CKTOTAL, CKMB, CKMBINDEX, TROPONINI in the last 168 hours. BNP (last 3 results) No results for input(s): PROBNP in the last 8760 hours. HbA1C: No results for input(s): HGBA1C in the last 72 hours. CBG: No results for input(s): GLUCAP in the last 168 hours. Lipid Profile: No results for input(s): CHOL, HDL, LDLCALC, TRIG, CHOLHDL, LDLDIRECT in the last 72 hours. Thyroid Function Tests: No results for input(s): TSH, T4TOTAL, FREET4, T3FREE, THYROIDAB in the last 72 hours. Anemia Panel: No results  for input(s): VITAMINB12, FOLATE, FERRITIN, TIBC, IRON, RETICCTPCT in the last 72 hours. Sepsis Labs: Recent Labs  Lab 03/30/20 1250 03/30/20 1456  LATICACIDVEN 1.4 1.4    Recent Results (from the past 240 hour(s))  Urine culture     Status: Abnormal   Collection Time: 03/30/20 12:50 PM   Specimen: Urine, Clean Catch  Result Value Ref Range Status   Specimen Description   Final    URINE, CLEAN CATCH Performed at [facility], [address] [place], [place] [address]    Special Requests   Final    NONE Performed at [facility], [address] [place], [place] [address]    Culture (A)  Final     <10,000 COLONIES/mL INSIGNIFICANT GROWTH Performed at [facility], [place] [address] [place], [place] [address]    Report Status 03/31/2020 FINAL  Final  Respiratory Panel by RT PCR (Flu A&B, Covid) - Nasopharyngeal Swab     Status: None   Collection Time: 03/30/20  2:02 PM   Specimen: Nasopharyngeal Swab  Result Value Ref Range Status   SARS Coronavirus 2 by RT PCR NEGATIVE NEGATIVE Final    Comment: (NOTE) SARS-CoV-2 target nucleic acids are NOT DETECTED.  The SARS-CoV-2 RNA is generally detectable in upper respiratoy specimens during the acute phase of infection. The lowest concentration of SARS-CoV-2 viral copies this assay can detect is 131 copies/mL. A negative result does not preclude SARS-Cov-2 infection and should not be used as the sole basis for treatment or other patient management decisions. A negative result may occur with  improper specimen collection/handling, submission of specimen other than nasopharyngeal swab, presence of viral mutation(s) within the areas targeted by this assay, and inadequate number of viral copies (<131 copies/mL). A negative result must be combined with clinical observations, patient history, and epidemiological information. The expected result is Negative.  Fact Sheet for Patients:  PinkCheek.be  Fact Sheet for Healthcare Providers:  GravelBags.it  This test is no t yet approved or cleared by the [place] FDA and  has been authorized for detection and/or diagnosis of SARS-CoV-2 by FDA under an Emergency Use Authorization (EUA). This EUA will remain  in effect (meaning this test can be used) for the duration of the COVID-19 declaration under Section 564(b)(1) of the Act, 21 U.S.C. section 360bbb-3(b)(1), unless the authorization is terminated or revoked sooner.     Influenza A by PCR NEGATIVE NEGATIVE Final   Influenza B by PCR NEGATIVE NEGATIVE Final    Comment:  (NOTE) The Xpert Xpress SARS-CoV-2/FLU/RSV assay is intended as an aid in  the diagnosis of influenza from Nasopharyngeal swab specimens and  should not be used as a sole basis for treatment. Nasal washings and  aspirates are unacceptable for Xpert Xpress SARS-CoV-2/FLU/RSV  testing.  Fact Sheet for Patients: PinkCheek.be  Fact Sheet for Healthcare Providers: GravelBags.it  This test is not yet approved or cleared by the [place] FDA and  has been authorized for detection and/or diagnosis of SARS-CoV-2 by  FDA under an Emergency Use Authorization (EUA). This EUA will remain  in effect (meaning this test can be used) for the duration of the  Covid-19 declaration under Section 564(b)(1) of the Act, 21  U.S.C. section 360bbb-3(b)(1), unless the authorization is  terminated or revoked. Performed at [facility], [address] [place], [place] [address]   Blood culture (routine x 2)     Status: None (Preliminary result)  Collection Time: 03/30/20  2:47 PM   Specimen: BLOOD  Result Value Ref Range Status   Specimen Description   Final    BLOOD RIGHT ANTECUBITAL Performed at [facility], [place] [address] [place], [place] [address]    Special Requests   Final    Blood Culture adequate volume BOTTLES DRAWN AEROBIC AND ANAEROBIC Performed at [facility], [address] [place], [place] [address]    Culture   Final    NO GROWTH 2 DAYS Performed at [facility], [place] [address], [place], [place] [address]    Report Status PENDING  Incomplete  Blood culture (routine x 2)     Status: None (Preliminary result)   Collection Time: 03/30/20  2:57 PM   Specimen: BLOOD LEFT HAND  Result Value Ref Range Status   Specimen Description   Final    BLOOD LEFT HAND Performed at [facility], [address] [place], [place] [address]    Special Requests Blood Culture adequate volume   Final   Culture   Final    NO GROWTH 2 DAYS Performed at [facility], [place] [address] [place], [place] [address]    Report Status PENDING  Incomplete         Radiology Studies: CT Angio Chest PE W/Cm &/Or Wo Cm  Result Date: 03/30/2020 CLINICAL DATA:  Shortness of breath, leukocytosis, on antibiotics for UTI EXAM: CT ANGIOGRAPHY CHEST WITH CONTRAST TECHNIQUE: Multidetector CT imaging of the chest was performed using the standard protocol during bolus administration of intravenous contrast. Multiplanar CT image reconstructions and MIPs were obtained to evaluate the vascular anatomy. CONTRAST:  19mL OMNIPAQUE IOHEXOL 350 MG/ML SOLN COMPARISON:  Coronary CT 06/25/2019 FINDINGS: Cardiovascular: Satisfactory opacification the pulmonary arteries to the segmental level. No pulmonary artery filling defects are identified. Central pulmonary arteries are normal caliber. The aortic root is suboptimally assessed given cardiac pulsation artifact. Atherosclerotic plaque within the normal caliber aorta. Shared origin of the brachiocephalic and left common carotid arteries. Aberrant origin of the right subclavian artery as well. Proximal great vessels are otherwise unremarkable. Slight reflux of contrast into the IVC and hepatic veins. Normal heart size. No pericardial effusion. Few scattered coronary artery calcifications, predominantly within the LAD. Mediastinum/Nodes: There are several clustered, enlarged subcarinal and right hilar nodes measuring up to 16 mm (4/41) as well as a 10 mm left hilar lymph node (4/42). No concerning axillary adenopathy. No mediastinal fluid or gas. Normal thyroid gland and thoracic inlet. No acute abnormality of the trachea or esophagus. Lungs/Pleura: Low volumes and atelectasis dependently likely accentuated by imaging during exhalation. There is some mild apical predominant interlobular septal thickening as well as fissural thickening more pronounced on the right than left.  Slight vascular cephalization is noted as well. No focal consolidative opacity. No pneumothorax or visible effusion. Additional bandlike opacities in the lung bases likely reflecting areas of scarring and or atelectasis. No concerning pulmonary nodules or masses. Upper Abdomen: No acute abnormalities present in the visualized portions of the upper abdomen. Likely hepatomegaly. Musculoskeletal: No acute osseous abnormality or suspicious osseous lesion. Exaggerated thoracic kyphosis. Increased AP diameter of the chest. Multilevel degenerative changes are present in the imaged portions of the spine. Multilevel flowing anterior osteophytosis, compatible with features of diffuse idiopathic skeletal hyperostosis (DISH). No worrisome chest wall lesions. Review of the MIP images confirms the above findings. IMPRESSION: 1. No evidence of acute pulmonary artery filling defects to suggest  pulmonary embolism. 2. Low volumes and atelectasis dependently likely accentuated by imaging during exhalation. 3. Interlobular septal and fissural thickening with cephalized vascularity, suggestive of mild interstitial edema. 4. Slight reflux of contrast into the IVC and hepatic veins, can be seen with right heart failure/volume overload. 5. Several clustered, enlarged subcarinal and right hilar nodes, nonspecific, though possibly reactive or edematous in etiology. 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 7. Hepatomegaly. 8. Aortic Atherosclerosis (ICD10-I70.0). Electronically Signed   By: Lovena Le M.D.   On: 03/30/2020 19:00   CT Renal Stone Study  Result Date: 03/30/2020 CLINICAL DATA:  Recent diagnosis of urinary tract infection. Shortness of breath and dysuria. Flank pain. Kidney stone suspected. EXAM: CT ABDOMEN AND PELVIS WITHOUT CONTRAST TECHNIQUE: Multidetector CT imaging of the abdomen and pelvis was performed following the standard protocol without IV contrast. COMPARISON:  Prior CT 10/31/2014. FINDINGS: Lower  chest: Mild dependent atelectasis at both lung bases. Mild coronary artery atherosclerosis. No significant pleural or pericardial effusion. Hepatobiliary: Generalized decreased hepatic density consistent with steatosis. No focal abnormalities are identified on noncontrast imaging. No evidence of gallstones, gallbladder wall thickening or biliary dilatation. Pancreas: Unremarkable. No pancreatic ductal dilatation or surrounding inflammatory changes. Spleen: Normal in size without focal abnormality. Adrenals/Urinary Tract: Both adrenal glands appear normal. Both kidneys appear unremarkable. There is no evidence of urinary tract calculus, hydronephrosis, perinephric soft tissue stranding or fluid collection. No focal renal abnormalities are identified. There is possible mild bladder wall thickening and possible mild perivesical inflammatory change. Stomach/Bowel: No evidence of bowel wall thickening, distention or surrounding inflammatory change. The stomach is incompletely distended. The appendix appears normal. Moderate stool throughout the colon. Scattered diverticulosis of the descending and sigmoid colon. Vascular/Lymphatic: There are no enlarged abdominal or pelvic lymph nodes. Mild aortic and branch vessel atherosclerosis. Reproductive: The uterus and ovaries appear normal. No adnexal mass. Other: No evidence of abdominal wall mass or hernia. No ascites. Musculoskeletal: No acute or significant osseous findings. Multilevel spondylosis again noted. IMPRESSION: 1. Possible mild bladder wall thickening and perivesical inflammatory change, suggesting possible cystitis. Correlation with urine analysis recommended. 2. No evidence of urinary tract calculus or hydronephrosis. No perinephric inflammatory changes. 3. Hepatic steatosis. 4. Aortic Atherosclerosis (ICD10-I70.0). Electronically Signed   By: Richardean Sale M.D.   On: 03/30/2020 16:05        Scheduled Meds: . albuterol  2.5 mg Nebulization TID  .  cholecalciferol  1,000 Units Oral Daily  . clopidogrel  75 mg Oral Daily  . enoxaparin (LOVENOX) injection  40 mg Subcutaneous Q24H  . fluticasone  1 spray Each Nare Daily  . LORazepam  0.5 mg Intravenous Once  . multivitamin with minerals  1 tablet Oral Daily  . pantoprazole  40 mg Oral Daily  . rosuvastatin  20 mg Oral Daily   Continuous Infusions: . cefTRIAXone (ROCEPHIN)  IV 1 g (04/01/20 1027)     LOS: 1 day    Time spent: 35 minutes.     Elmarie Shiley, MD [facility]   If 7PM-7AM, please contact night-coverage [email or web address]  04/01/2020, 2:40 PM

## 2020-04-01 NOTE — Evaluation (Signed)
Physical Therapy Evaluation Patient Details Name: Brittany Middleton MRN: [id number] DOB: 1951-04-02 Today's Date: 04/01/2020   History of Present Illness  Patient is a 69 y/o female who presents with left flank pain, nausea, SOB, fatigue, chronic cough. Recently diagnosed with UTI. Admitted with presumed pyelonphritis. PMH includes HTN, HLD and Hepatic steatosis.  Clinical Impression  Patient presents with dyspnea on exertion, decreased activity tolerance, impaired balance, decreased endurance and impaired mobility s/p above. Pt lives at home with her elderly mother and is independent for ADLs/IADLs PTA. Works in her garden and walks her dog ~1 mile/day. Today, pt tolerated transfers and gait training with Min guard-Min A for balance/safety. Noted to have staggering to right/left and balance deficits esp with head turns. Pt with 2-3/4 DOE. Sp02 remained >91% on RA. 1 standing rest break needed. Pt reports feeling disoriented and having difficulty focusing on things when walking. Might benefit from use of DME at home. Recommend Brain MRI to rule out anything causing new deficits. Will follow acutely to maximize independence and mobility prior to return home.    Follow Up Recommendations Outpatient PT;Supervision - Intermittent (pending improvement/use of DME)    Equipment Recommendations  Other (comment) (TBA)    Recommendations for Other Services OT consult     Precautions / Restrictions Precautions Precautions: Fall Restrictions Weight Bearing Restrictions: No      Mobility  Bed Mobility               General bed mobility comments: Up in chair upon PT arrival.  Transfers Overall transfer level: Needs assistance Equipment used: None Transfers: Sit to/from Stand Sit to Stand: Min guard         General transfer comment: Min guard for safety. Stood from chair x1, reaching for rail for support.  Ambulation/Gait Ambulation/Gait assistance: Min assist;Min guard Gait  Distance (Feet): 70 Feet Assistive device: None Gait Pattern/deviations: Step-through pattern;Decreased stride length;Staggering right;Staggering left Gait velocity: decreased   General Gait Details: Slow, unsteady gait with staggering to right/left esp with head turns, running into rail in hallway. 2-3/4 DOE. Sp02 remained >91% on RA.  Stairs            Wheelchair Mobility    Modified Rankin (Stroke Patients Only)       Balance Overall balance assessment: Needs assistance;History of Falls Sitting-balance support: Feet supported;No upper extremity supported Sitting balance-Leahy Scale: Good     Standing balance support: During functional activity Standing balance-Leahy Scale: Fair Standing balance comment: Requires UE support for dynamic tasks for safety, able to stand statically without support.                             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 Walks her dog named Brittany Middleton. Reports a few falls. Has been having exertional dyspnea     Hand Dominance        Extremity/Trunk Assessment   Upper Extremity Assessment Upper Extremity Assessment: Defer to OT evaluation    Lower Extremity Assessment Lower Extremity Assessment:  Overall [facility] for tasks assessed;RLE deficits/detail RLE Deficits / Details: Area of discomfort on distal RLE, sensitive to touch- premorbid. RLE Sensation: WNL       Communication   Communication: No difficulties  Cognition Arousal/Alertness:  Awake/alert Behavior During Therapy: WFL for tasks assessed/performed Overall Cognitive Status: Within Functional Limits for tasks assessed                                        General Comments General comments (skin integrity, edema, etc.): Son present during session.    Exercises     Assessment/Plan    PT Assessment Patient needs continued PT services  PT Problem List Decreased mobility;Decreased balance;Decreased knowledge of use of DME;Cardiopulmonary status limiting activity;Decreased activity tolerance       PT Treatment Interventions Therapeutic activities;Gait training;Therapeutic exercise;Patient/family education;Balance training;Functional mobility training;DME instruction    PT Goals (Current goals can be found in the Care Plan section)  Acute Rehab PT Goals Patient Stated Goal: be able to walk my dog and garden PT Goal Formulation: With patient Time For Goal Achievement: 04/15/20 Potential to Achieve Goals: Good    Frequency Min 3X/week   Barriers to discharge Decreased caregiver support lives with 11 y/o mother    Co-evaluation               AM-PAC PT "6 Clicks" Mobility  Outcome Measure Help needed turning from your back to your side while in a flat bed without using bedrails?: None Help needed moving from lying on your back to sitting on the side of a flat bed without using bedrails?: None Help needed moving to and from a bed to a chair (including a wheelchair)?: A Little Help needed standing up from a chair using your arms (e.g., wheelchair or bedside chair)?: A Little Help needed to walk in hospital room?: A Little Help needed climbing 3-5 steps with a railing? : A Little 6 Click Score: 20    End of Session Equipment Utilized During Treatment: Gait belt Activity Tolerance: Other (comment) (dyspnea) Patient left: in chair;with call bell/phone within reach;with family/visitor present Nurse Communication: Mobility status PT  Visit Diagnosis: Unsteadiness on feet (R26.81);Other (comment) (dyspnea on exertion)    Time: 4920-1007 PT Time Calculation (min) (ACUTE ONLY): 19 min   Charges:   PT Evaluation $PT Eval Moderate Complexity: 1 Mod          Marisa Severin, PT, DPT Acute Rehabilitation Services Pager [phone] Office [phone]      Brittany Middleton 04/01/2020, 2:07 PM

## 2020-04-01 NOTE — Consult Note (Signed)
NAME:  Brittany Middleton, MRN:  [id number], DOB:  [birth year], LOS: 1 ADMISSION DATE:  03/30/2020, CONSULTATION DATE:  04/01/2011 REFERRING MD:  Dr. Nevada Crane, CHIEF COMPLAINT: Dyspnea  Brief History   69yo female presented with complaints of UTI symptoms and later developed progressive dyspnea and intermittent cough.    History of present illness   Brittany Middleton is a 69yo female who initially came in for complaints of dysuria. Left flank pain, nausea, and vomiting. She was admitted by the hospitlait service for treatment of presumed pyelonephritis.  PCCM was consulted 10/11 for complaints of worsened fatigue, chronic cough, dyspnea, and intermittent wheezing. She states that her biggest complaint Is the increased fatigue and exertional dyspnea. She reports she noticed these have worsened over the last two months. She denies any, chest pain, orthopnea, or lower extremity swelling. On review of history she reports two prior episodes of pneumonia since 1990 both required hospitalization. She denies any tobacco use but reports significant second hand exposure. She denies any occupational exposures. She has never seen a pulmonologist or had any pulmonary workup.   Past Medical History  UTI HTN HLD Hiatal hernia  Cervical and lumbar disk disease   Significant Hospital Events   Admitted 10/11  Consults:  PCCM  Procedures:    Significant Diagnostic Tests:  CXR 10/10 > Minimal right basilar atelectasis.  CT renal stone study 10/10 >  1. Possible mild bladder wall thickening and perivesical inflammatory change, suggesting possible cystitis. Correlation with urine analysis recommended. 2. No evidence of urinary tract calculus or hydronephrosis. No perinephric inflammatory changes. 3. Hepatic steatosis. 4. Aortic Atherosclerosis (ICD10-I70.0).  CT angio chest 10/10 > 1. No evidence of acute pulmonary artery filling defects to suggest pulmonary embolism. 2. Low volumes and atelectasis  dependently likely accentuated by imaging during exhalation. 3. Interlobular septal and fissural thickening with cephalized vascularity, suggestive of mild interstitial edema. 4. Slight reflux of contrast into the IVC and hepatic veins, can be seen with right heart failure/volume overload. 5. Several clustered, enlarged subcarinal and right hilar nodes, nonspecific, though possibly reactive or edematous in etiology. 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 7. Hepatomegaly. 8. Aortic Atherosclerosis (ICD10-I70.0).  Micro Data:  COVID 10/10 > Negative  Urine culture 10/10 > Insignificant growth  Blood culture 10/10 >   Antimicrobials:  Ceftriaxone 10/10 >  Interim history/subjective:  Patient reports feeling better this AM and says her breathing has improved with the albuterol inhaler and nebulizer treatments.  Objective   Blood pressure 126/60, pulse 62, temperature 97.8 F (36.6 C), temperature source Oral, resp. rate 18, height 5\' 5"  (1.651 m), weight 121.7 kg, SpO2 96 %.        Intake/Output Summary (Last 24 hours) at 04/01/2020 1111 Last data filed at 04/01/2020 0900 Gross per 24 hour  Intake 1260 ml  Output --  Net 1260 ml   Filed Weights   03/30/20 1230 03/31/20 1152  Weight: 120.2 kg 121.7 kg    Examination: General:no acute distress, lying in bed, obese HEENT: NCAT, sclera anicteric Neuro:A&Ox3, moving all extremities CV: RRR, s1s2 no murmurs PULM:Bibasilar crackles GI: soft, non-distended, BS+ Extremities: no edema. warm Skin: no rashes or lesions   Resolved Hospital Problem list     Assessment & Plan:  Chronic Cough/Exertional Dyspnea  She appears to have chronic dry cough secondary to GERD and sinus disease related to seasonal allergies. We recommend that she start pantoprazole 40mg  daily and instructed to elevate the head of her bed when sleeping.  She is to start flonase 1 spray per nostril daily. She can continue on albuterol inhaler  at time of discharge as needed for dyspnea. She can also benefit from having nebulizer machine and albuterol neb solution at home to be used as needed.   She is also taking lisinopril-HCTZ. She should be taken off ace inhibiter due to cough side effects and switched to a different agent.  She is scheduled to follow up with me in clinic on May 01, 2020 for PFTs at 1pm and clinic appointment at 2pm  Labs   CBC: Recent Labs  Lab 03/30/20 1250 03/31/20 1313 04/01/20 0237  WBC 10.8* 7.9 5.5  NEUTROABS 10.0* 6.8 3.6  HGB 13.8 12.2 11.8*  HCT 41.8 38.5 37.5  MCV 90.5 92.5 94.7  PLT 186 152 638    Basic Metabolic Panel: Recent Labs  Lab 03/30/20 1250 03/31/20 1313 04/01/20 0237  NA 134* 138 141  K 3.5 3.3* 3.9  CL 98 103 104  CO2 25 26 28   GLUCOSE 143* 123* 110*  BUN 14 11 7*  CREATININE 0.78 0.77 0.79  CALCIUM 8.8* 8.5* 8.6*  MG  --   --  2.2   GFR: Estimated Creatinine Clearance: 89.3 mL/min (by C-G formula based on SCr of 0.79 mg/dL). Recent Labs  Lab 03/30/20 1250 03/30/20 1456 03/31/20 1313 04/01/20 0237  WBC 10.8*  --  7.9 5.5  LATICACIDVEN 1.4 1.4  --   --     Liver Function Tests: Recent Labs  Lab 03/30/20 1250 03/31/20 1313 04/01/20 0237  AST 25 30 30   ALT 19 22 25   ALKPHOS 50 39 42  BILITOT 1.2 0.8 0.8  PROT 7.1 5.8* 6.0*  ALBUMIN 3.9 2.9* 2.9*   Recent Labs  Lab 03/30/20 1250  LIPASE 31    Freda Jackson, MD [facility] Office: [phone]   See Amion for Pager Details

## 2020-04-01 NOTE — Progress Notes (Signed)
SATURATION QUALIFICATIONS: (This note is used to comply with regulatory documentation for home oxygen)  Patient Saturations on Room Air at Rest =   96%  Patient Saturations on Room Air while Ambulating = 92%  Patient Saturations on    N/a  Liters of oxygen while Ambulating =  N/A Please briefly explain why patient needs home oxygen:

## 2020-04-02 DIAGNOSIS — N12 Tubulo-interstitial nephritis, not specified as acute or chronic: Principal | ICD-10-CM

## 2020-04-02 DIAGNOSIS — R0902 Hypoxemia: Secondary | ICD-10-CM

## 2020-04-02 LAB — CBC
HCT: 35.3 % — ABNORMAL LOW (ref 36.0–46.0)
Hemoglobin: 11 g/dL — ABNORMAL LOW (ref 12.0–15.0)
MCH: 28.9 pg (ref 26.0–34.0)
MCHC: 31.2 g/dL (ref 30.0–36.0)
MCV: 92.9 fL (ref 80.0–100.0)
Platelets: 173 10*3/uL (ref 150–400)
RBC: 3.8 MIL/uL — ABNORMAL LOW (ref 3.87–5.11)
RDW: 13.2 % (ref 11.5–15.5)
WBC: 3.3 10*3/uL — ABNORMAL LOW (ref 4.0–10.5)
nRBC: 0 % (ref 0.0–0.2)

## 2020-04-02 LAB — BASIC METABOLIC PANEL
Anion gap: 8 (ref 5–15)
BUN: 7 mg/dL — ABNORMAL LOW (ref 8–23)
CO2: 28 mmol/L (ref 22–32)
Calcium: 8.5 mg/dL — ABNORMAL LOW (ref 8.9–10.3)
Chloride: 104 mmol/L (ref 98–111)
Creatinine, Ser: 0.72 mg/dL (ref 0.44–1.00)
GFR, Estimated: >60 mL/min (ref >60)
Glucose, Bld: 110 mg/dL — ABNORMAL HIGH (ref 70–99)
Potassium: 3.8 mmol/L (ref 3.5–5.1)
Sodium: 140 mmol/L (ref 135–145)

## 2020-04-02 LAB — VITAMIN B12: Vitamin B-12: 967 pg/mL — ABNORMAL HIGH (ref 180–914)

## 2020-04-02 NOTE — Discharge Summary (Signed)
Physician Discharge Summary  Brittany Middleton EHO:[id number] DOB: [birth year] DOA: 03/30/2020  PCP: Maurice Small, MD  Admit date: 03/30/2020 Discharge date: 04/02/2020  Time spent: 35 minutes  Recommendations for Outpatient Follow-up:  1. PCP in 1 week, monitor blood pressure and start low-dose diuretic or amlodipine as outpatient if needed 2. Pulmonary Dr. Freda Jackson on 11/11 for PFTs and follow-up clinic appointment   Discharge Diagnoses:  Active Problems:   Pyelonephritis  Chronic cough  Exertional dyspnea Obesity Hypertension   discharge Condition: Stable  Diet recommendation: Low-sodium, heart healthy  Filed Weights   03/30/20 1230 03/31/20 1152 04/01/20 2021  Weight: 120.2 kg 121.7 kg 125.5 kg    History of present illness:  69 year old with past medical history significant for chronic diastolic dysfunction, UTI 2 years ago, hypertension, hyperlipidemia coronary artery aneurysm who presented to [facility] complaining of dysuria, left flank pain, nausea and vomiting.  Patient was previously evaluated at urgent care on Saturday and she was diagnosed with a UTI and prescribed Macrobid.  She continued to have fever after she left the urgent care, associated with nausea and vomiting and left flank pain.  Her symptoms worsened on Sunday and she presented for further evaluation. Patient was a started on IV ceftriaxone.  CT scan renal protocol was negative for hydronephrosis or renal stone suspected cystitis. She also report chronic cough and shortness of breath on exertion.  She presented with oxygen saturation at 80%  on room air, diffuse wheezing.  Chest x-ray showed bibasilar atelectasis.  CT angio negative for acute PE, interlobular septal and fissural thickening with cephalitis vascularity, suggestive of mild interstitial edema.  Several cluster, enlarged subcarinal and right hilar nodes nonspecific.  BNP was normal.   Hospital Course:    Pyelonephritis: -Patient presented with flank pain, dysuria, nausea vomiting, fevers -CT renal protocol was negative for hydronephrosis or calculi -Urine culture insignificant growth but she was already on antibiotics prior to admission. -Treated with 3 days of IV ceftriaxone, stopped antibiotics after third dose -Clinically improved and stable  Chronic nonproductive cough, exertional dyspnea/wheezing -Suspected to be related to GERD, allergies, also long history of secondhand smoking exposure, patient is a non-smoker  -Pulmonary consulted, recommended stopping ACE inhibitor -Improved, treated with PPI, albuterol inhaler and Flonase added  -Pulmonary MD recommended outpatient follow-up on 11/11 with PFTs -2D echo in 2019 noted grade 1 diastolic dysfunction, consider low-dose diuretic/repeat echo down the road depending on symptoms  Dizziness confusion, balance problem gait problem: -Likely secondary to hypotension, improved -My partner checked an MRI brain yesterday which was unremarkable  hypertension: Blood pressure continued to be in the normal range without meds, lisinopril discontinued -Consider starting low-dose diuretic down the road if an antihypertensive is needed  Morbid obesity; need weight loss.  Estimated body mass index is 44.63 kg/m as calculated from the following:   Height as of this encounter: 5\' 5"  (1.651 m).   Weight as of this encounter: 121.7 kg.    Consultations:  Pulmonary  Discharge Exam: Vitals:   04/02/20 0426 04/02/20 0918  BP: 122/66 125/61  Pulse: 62 61  Resp:  18  Temp: 98.6 F (37 C) 98.1 F (36.7 C)  SpO2: 92% 93%    General: AAOx3 Cardiovascular:S1s2/RRR Respiratory: CTAB  Discharge Instructions   Discharge Instructions    Diet - low sodium heart healthy   Complete by: As directed    Increase activity slowly   Complete by: As directed      Allergies as of  04/02/2020      Reactions   Ciprofloxacin Other (See  Comments)   Caused PAIN IN HANDS    Codeine Nausea And Vomiting      Medication List    STOP taking these medications   cephALEXin 500 MG capsule Commonly known as: KEFLEX   ELDERBERRY PO   lisinopril-hydrochlorothiazide 20-25 MG tablet Commonly known as: ZESTORETIC   metoprolol tartrate 50 MG tablet Commonly known as: LOPRESSOR   omeprazole 40 MG capsule Commonly known as: PRILOSEC   phenazopyridine 200 MG tablet Commonly known as: PYRIDIUM   vitamin E 180 MG (400 UNITS) capsule     TAKE these medications   acetaminophen 500 MG tablet Commonly known as: TYLENOL Take 1,000 mg by mouth every 6 (six) hours as needed for mild pain, moderate pain, fever or headache.   albuterol 108 (90 Base) MCG/ACT inhaler Commonly known as: VENTOLIN HFA Inhale 2 puffs into the lungs every 6 (six) hours as needed for wheezing or shortness of breath.   BIOTIN PO Take 1 tablet by mouth daily.   clopidogrel 75 MG tablet Commonly known as: PLAVIX Take 1 tablet (75 mg total) by mouth daily.   CO Q 10 PO Take 1 capsule by mouth daily.   fluticasone 50 MCG/ACT nasal spray Commonly known as: FLONASE Place 1 spray into both nostrils daily.   IRON PO Take 1 tablet by mouth 2 (two) times a week.   Krill Oil 300 MG Caps Take 300 mg by mouth daily.   Melatonin 5 MG Chew Chew 5 mg by mouth at bedtime.   multivitamin with minerals tablet Take 1 tablet by mouth daily.   pantoprazole 40 MG tablet Commonly known as: PROTONIX Take 1 tablet (40 mg total) by mouth daily.   rosuvastatin 20 MG tablet Commonly known as: CRESTOR Take 1 tablet (20 mg total) by mouth daily. What changed: when to take this   Vitamin D-3 25 MCG (1000 UT) Caps Take 1,000 Units by mouth daily.      Allergies  Allergen Reactions  . Ciprofloxacin Other (See Comments)    Caused PAIN IN HANDS   . Codeine Nausea And Vomiting    Follow-up Information    Freddi Starr, MD Follow up on 05/01/2020.    Specialty: Pulmonary Disease Why: Pulmonary Function Tests at 1pm Office Visit at [facility] Address: [address] [place], [place] [address] Contact information: [address] [place] [address] [phone]        Maurice Small, MD. Schedule an appointment as soon as possible for a visit in 1 week(s).   Specialty: Family Medicine Contact information: [place] [address] [phone]        Revankar, [name], MD .   Specialty: Cardiology Contact information: [place]  [place] [address] [phone]                The results of significant diagnostics from this hospitalization (including imaging, microbiology, ancillary and laboratory) are listed below for reference.    Significant Diagnostic Studies: CT Angio Chest PE W/Cm &/Or Wo Cm  Result Date: 03/30/2020 CLINICAL DATA:  Shortness of breath, leukocytosis, on antibiotics for UTI EXAM: CT ANGIOGRAPHY CHEST WITH CONTRAST TECHNIQUE: Multidetector CT imaging of the chest was performed using the standard protocol during bolus administration of intravenous contrast. Multiplanar CT image reconstructions and MIPs were obtained to evaluate the vascular anatomy. CONTRAST:  55mL OMNIPAQUE IOHEXOL 350 MG/ML SOLN COMPARISON:  Coronary CT 06/25/2019 FINDINGS: Cardiovascular: Satisfactory opacification the pulmonary arteries to the segmental level. No pulmonary artery filling defects are identified. Central pulmonary arteries are normal caliber. The aortic root is suboptimally assessed given cardiac pulsation artifact. Atherosclerotic plaque within the normal caliber aorta. Shared origin of the brachiocephalic and left common carotid arteries. Aberrant origin of the right subclavian artery as well. Proximal great vessels are otherwise unremarkable. Slight reflux of contrast into the IVC and hepatic veins. Normal heart size. No pericardial effusion.  Few scattered coronary artery calcifications, predominantly within the LAD. Mediastinum/Nodes: There are several clustered, enlarged subcarinal and right hilar nodes measuring up to 16 mm (4/41) as well as a 10 mm left hilar lymph node (4/42). No concerning axillary adenopathy. No mediastinal fluid or gas. Normal thyroid gland and thoracic inlet. No acute abnormality of the trachea or esophagus. Lungs/Pleura: Low volumes and atelectasis dependently likely accentuated by imaging during exhalation. There is some mild apical predominant interlobular septal thickening as well as fissural thickening more pronounced on the right than left. Slight vascular cephalization is noted as well. No focal consolidative opacity. No pneumothorax or visible effusion. Additional bandlike opacities in the lung bases likely reflecting areas of scarring and or atelectasis. No concerning pulmonary nodules or masses. Upper Abdomen: No acute abnormalities present in the visualized portions of the upper abdomen. Likely hepatomegaly. Musculoskeletal: No acute osseous abnormality or suspicious osseous lesion. Exaggerated thoracic kyphosis. Increased AP diameter of the chest. Multilevel degenerative changes are present in the imaged portions of the spine. Multilevel flowing anterior osteophytosis, compatible with features of diffuse idiopathic skeletal hyperostosis (DISH). No worrisome chest wall lesions. Review of the MIP images confirms the above findings. IMPRESSION: 1. No evidence of acute pulmonary artery filling defects to suggest pulmonary embolism. 2. Low volumes and atelectasis dependently likely accentuated by imaging during exhalation. 3. Interlobular septal and fissural thickening with cephalized vascularity, suggestive of mild interstitial edema. 4. Slight reflux of contrast into the IVC and hepatic veins, can be seen with right heart failure/volume overload. 5. Several clustered, enlarged subcarinal and right hilar nodes,  nonspecific, though possibly reactive or edematous in etiology. 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 7. Hepatomegaly. 8. Aortic Atherosclerosis (ICD10-I70.0). Electronically Signed   By: Lovena Le M.D.   On: 03/30/2020 19:00   MR BRAIN WO CONTRAST  Result Date: 04/01/2020 CLINICAL DATA:  Mental status change, unknown cause. EXAM: MRI HEAD WITHOUT CONTRAST TECHNIQUE: Multiplanar, multiecho pulse sequences of the brain and surrounding structures were obtained without intravenous contrast. COMPARISON:  No pertinent prior exams are available for comparison. FINDINGS: Brain: Cerebral volume is normal for age. A few scattered punctate foci of T2/FLAIR hyperintensity within the cerebral white matter are nonspecific, but may reflect minimal changes of chronic small vessel ischemia. There is no acute infarct. No evidence of intracranial mass. No chronic intracranial blood products. No extra-axial fluid collection. No midline shift. Vascular: Expected proximal arterial flow voids. Skull and upper cervical spine: No focal marrow lesion. Sinuses/Orbits: Visualized orbits show no acute finding. Mild ethmoid sinus mucosal thickening. Trace fluid within right mastoid air cells. IMPRESSION: No evidence of acute intracranial abnormality, including acute infarction. A few scattered punctate foci of T2 hyperintensity within the cerebral white matter are nonspecific, but may reflect minimal changes of chronic small vessel ischemia. Mild ethmoid sinus mucosal thickening. Trace right mastoid effusion. Electronically Signed   By: Kellie Simmering DO   On: 04/01/2020 18:20  DG Chest Portable 1 View  Result Date: 03/30/2020 CLINICAL DATA:  Abdominal pain and shortness of breath. EXAM: PORTABLE CHEST 1 VIEW COMPARISON:  Chest radiograph 04/05/2019 FINDINGS: Normal cardiac and mediastinal contours. Minimal right basilar heterogeneous opacities. No pleural effusion or pneumothorax. IMPRESSION: Minimal right basilar  atelectasis. Electronically Signed   By: Lovey Newcomer M.D.   On: 03/30/2020 13:57   CT Renal Stone Study  Result Date: 03/30/2020 CLINICAL DATA:  Recent diagnosis of urinary tract infection. Shortness of breath and dysuria. Flank pain. Kidney stone suspected. EXAM: CT ABDOMEN AND PELVIS WITHOUT CONTRAST TECHNIQUE: Multidetector CT imaging of the abdomen and pelvis was performed following the standard protocol without IV contrast. COMPARISON:  Prior CT 10/31/2014. FINDINGS: Lower chest: Mild dependent atelectasis at both lung bases. Mild coronary artery atherosclerosis. No significant pleural or pericardial effusion. Hepatobiliary: Generalized decreased hepatic density consistent with steatosis. No focal abnormalities are identified on noncontrast imaging. No evidence of gallstones, gallbladder wall thickening or biliary dilatation. Pancreas: Unremarkable. No pancreatic ductal dilatation or surrounding inflammatory changes. Spleen: Normal in size without focal abnormality. Adrenals/Urinary Tract: Both adrenal glands appear normal. Both kidneys appear unremarkable. There is no evidence of urinary tract calculus, hydronephrosis, perinephric soft tissue stranding or fluid collection. No focal renal abnormalities are identified. There is possible mild bladder wall thickening and possible mild perivesical inflammatory change. Stomach/Bowel: No evidence of bowel wall thickening, distention or surrounding inflammatory change. The stomach is incompletely distended. The appendix appears normal. Moderate stool throughout the colon. Scattered diverticulosis of the descending and sigmoid colon. Vascular/Lymphatic: There are no enlarged abdominal or pelvic lymph nodes. Mild aortic and branch vessel atherosclerosis. Reproductive: The uterus and ovaries appear normal. No adnexal mass. Other: No evidence of abdominal wall mass or hernia. No ascites. Musculoskeletal: No acute or significant osseous findings. Multilevel  spondylosis again noted. IMPRESSION: 1. Possible mild bladder wall thickening and perivesical inflammatory change, suggesting possible cystitis. Correlation with urine analysis recommended. 2. No evidence of urinary tract calculus or hydronephrosis. No perinephric inflammatory changes. 3. Hepatic steatosis. 4. Aortic Atherosclerosis (ICD10-I70.0). Electronically Signed   By: Richardean Sale M.D.   On: 03/30/2020 16:05    Microbiology: Recent Results (from the past 240 hour(s))  Urine culture     Status: Abnormal   Collection Time: 03/30/20 12:50 PM   Specimen: Urine, Clean Catch  Result Value Ref Range Status   Specimen Description   Final    URINE, CLEAN CATCH Performed at [facility], [address] [place], [place] [address]    Special Requests   Final    NONE Performed at [facility], [address] [place], [place] [address]    Culture (A)  Final    <10,000 COLONIES/mL INSIGNIFICANT GROWTH Performed at [facility], [place] [address], [place], [place] [address]    Report Status 03/31/2020 FINAL  Final  Respiratory Panel by RT PCR (Flu A&B, Covid) - Nasopharyngeal Swab     Status: None   Collection Time: 03/30/20  2:02 PM   Specimen: Nasopharyngeal Swab  Result Value Ref Range Status   SARS Coronavirus 2 by RT PCR NEGATIVE NEGATIVE Final    Comment: (NOTE) SARS-CoV-2 target nucleic acids are NOT DETECTED.  The SARS-CoV-2 RNA is generally detectable in upper respiratoy specimens during the acute phase of infection. The lowest concentration of SARS-CoV-2 viral copies this assay can detect is 131 copies/mL. A negative result does not preclude SARS-Cov-2 infection and should not be used  as the sole basis for treatment or other patient management decisions. A negative result may occur with  improper specimen collection/handling, submission of specimen other than nasopharyngeal swab, presence of viral mutation(s) within the areas targeted by this  assay, and inadequate number of viral copies (<131 copies/mL). A negative result must be combined with clinical observations, patient history, and epidemiological information. The expected result is Negative.  Fact Sheet for Patients:  PinkCheek.be  Fact Sheet for Healthcare Providers:  GravelBags.it  This test is no t yet approved or cleared by the [place] FDA and  has been authorized for detection and/or diagnosis of SARS-CoV-2 by FDA under an Emergency Use Authorization (EUA). This EUA will remain  in effect (meaning this test can be used) for the duration of the COVID-19 declaration under Section 564(b)(1) of the Act, 21 U.S.C. section 360bbb-3(b)(1), unless the authorization is terminated or revoked sooner.     Influenza A by PCR NEGATIVE NEGATIVE Final   Influenza B by PCR NEGATIVE NEGATIVE Final    Comment: (NOTE) The Xpert Xpress SARS-CoV-2/FLU/RSV assay is intended as an aid in  the diagnosis of influenza from Nasopharyngeal swab specimens and  should not be used as a sole basis for treatment. Nasal washings and  aspirates are unacceptable for Xpert Xpress SARS-CoV-2/FLU/RSV  testing.  Fact Sheet for Patients: PinkCheek.be  Fact Sheet for Healthcare Providers: GravelBags.it  This test is not yet approved or cleared by the [place] FDA and  has been authorized for detection and/or diagnosis of SARS-CoV-2 by  FDA under an Emergency Use Authorization (EUA). This EUA will remain  in effect (meaning this test can be used) for the duration of the  Covid-19 declaration under Section 564(b)(1) of the Act, 21  U.S.C. section 360bbb-3(b)(1), unless the authorization is  terminated or revoked. Performed at [facility], [address] [place], [place] [address]   Blood culture (routine x 2)     Status: None (Preliminary result)    Collection Time: 03/30/20  2:47 PM   Specimen: BLOOD  Result Value Ref Range Status   Specimen Description   Final    BLOOD RIGHT ANTECUBITAL Performed at  [facility], [place] [address] [place], [place] [address]    Special Requests   Final    Blood Culture adequate volume BOTTLES DRAWN AEROBIC AND ANAEROBIC Performed at [facility] [address] [place], [place] [address]    Culture   Final    NO GROWTH 3 DAYS Performed at [facility], [place] [address], [place], [place] [address]    Report Status PENDING  Incomplete  Blood culture (routine x 2)     Status: None (Preliminary result)   Collection Time: 03/30/20  2:57 PM   Specimen: BLOOD LEFT HAND  Result Value Ref Range Status   Specimen Description   Final    BLOOD LEFT HAND Performed at [facility], [address] [place], [place] [address]    Special Requests Blood Culture adequate volume  Final   Culture   Final    NO GROWTH 3 DAYS Performed at [facility], [place] [address] [place], [place] [address]    Report Status PENDING  Incomplete     Labs: Basic Metabolic Panel: Recent Labs  Lab 03/30/20 1250 03/31/20 1313 04/01/20 0237 04/02/20 0237  NA 134* 138 141 140  K 3.5 3.3* 3.9 3.8  CL 98 103 104 104  CO2  25 26 28 28   GLUCOSE 143* 123* 110* 110*  BUN 14 11 7* 7*  CREATININE 0.78 0.77 0.79 0.72  CALCIUM 8.8* 8.5* 8.6* 8.5*  MG  --   --  2.2  --    Liver Function Tests: Recent Labs  Lab 03/30/20 1250 03/31/20 1313 04/01/20 0237  AST 25 30 30   ALT 19 22 25   ALKPHOS 50 39 42  BILITOT 1.2 0.8 0.8  PROT 7.1 5.8* 6.0*  ALBUMIN 3.9 2.9* 2.9*   Recent Labs  Lab 03/30/20 1250  LIPASE 31   No results for input(s): AMMONIA in the last 168 hours. CBC: Recent Labs  Lab 03/30/20 1250 03/31/20 1313 04/01/20 0237 04/02/20 0237  WBC 10.8* 7.9 5.5 3.3*  NEUTROABS 10.0* 6.8 3.6  --   HGB 13.8 12.2 11.8* 11.0*  HCT 41.8 38.5 37.5 35.3*  MCV 90.5 92.5 94.7 92.9   PLT 186 152 154 173   Cardiac Enzymes: No results for input(s): CKTOTAL, CKMB, CKMBINDEX, TROPONINI in the last 168 hours. BNP: BNP (last 3 results) Recent Labs    03/30/20 1708  BNP 95.5    ProBNP (last 3 results) No results for input(s): PROBNP in the last 8760 hours.  CBG: No results for input(s): GLUCAP in the last 168 hours.     Signed:  Domenic Polite MD.  [facility] 04/02/2020, 4:29 PM

## 2020-04-02 NOTE — Progress Notes (Addendum)
Physical Therapy Treatment Patient Details Name: Brittany Middleton MRN: [id number] DOB: Aug 11, 1950 Today's Date: 04/02/2020    History of Present Illness Patient is a 69 y/o female who presents with left flank pain, nausea, SOB, fatigue, chronic cough. Recently diagnosed with UTI. Admitted with presumed pyelonphritis. PMH includes HTN, HLD and Hepatic steatosis.    PT Comments    Pt and son present in room, pt about to discharge; therapist addressing any questions regarding functional level; pt and son educated on performing LE exercises prior to standing and to remain standing prior to ambulating to ensure balanced; pt continues to require S during ambulation due to increased lateral weight shift and occasional staggered stepping to regain balance; education to look forward and as balance improved challenge balance with head movement; pt and son verbalized understanding; pt is safe to discharge home with intermittent supervision and follow up OP therapy.    Follow Up Recommendations  Outpatient PT;Supervision - Intermittent     Equipment Recommendations  Other (comment);None recommended by PT; pt to follow up with outpatient to determine if equipment is needed, son stated he was comfortable with her going home    Recommendations for Other Services       Precautions / Restrictions Precautions Precautions: Fall Restrictions Weight Bearing Restrictions: No    Mobility  Bed Mobility Overal bed mobility: Independent             General bed mobility comments: no assist required  Transfers Overall transfer level: Modified independent Equipment used: None Transfers: Sit to/from Stand Sit to Stand: mod I         General transfer: modified independent  Ambulation/Gait Ambulation/Gait assistance: Modified independent (Device/Increase time) Gait Distance (Feet): 80 Feet Assistive device: None Gait Pattern/deviations: Step-through pattern;Decreased stride  length;Staggering right;Staggering left     General Gait Details: pt with increased lateral weight shifting during ambulation; pt educated on slowing down as well as looking forward during ambulation to avoid LOB, pt and son verbalized understanding; education to progress balance activities during ambulation with head movements once gait more stable   Stairs             Wheelchair Mobility    Modified Rankin (Stroke Patients Only)       Balance Overall balance assessment: Mild deficits observed, not formally tested                                          Cognition Arousal/Alertness: Awake/alert Behavior During Therapy: WFL for tasks assessed/performed Overall Cognitive Status: Within Functional Limits for tasks assessed                                        Exercises      General Comments General comments (skin integrity, edema, etc.): pt educated on LE exercises to perform prior to standing after sitting for increased time (ankle pumps, LAQ, hip flexion), pt educated on to stand x 10 seconds prior to ambulating to ensure no dizziness; pt educated on slowly down during the day and allow times for rest; pt and son vervalized understanding      Pertinent Vitals/Pain Pain Assessment: No/denies pain           PT Goals (current goals can now be found in the care plan section) Acute Rehab PT  Goals Patient Stated Goal: TO go home PT Goal Formulation: With patient Time For Goal Achievement: 04/15/20 Potential to Achieve Goals: Good Progress towards PT goals: Progressing toward goals    Frequency    Min 3X/week      PT Plan Current plan remains appropriate    Co-evaluation              AM-PAC PT "6 Clicks" Mobility   Outcome Measure  Help needed turning from your back to your side while in a flat bed without using bedrails?: None Help needed moving from lying on your back to sitting on the side of a flat bed without  using bedrails?: None Help needed moving to and from a bed to a chair (including a wheelchair)?: None Help needed standing up from a chair using your arms (e.g., wheelchair or bedside chair)?: None Help needed to walk in hospital room?: None Help needed climbing 3-5 steps with a railing? : A Little 6 Click Score: 23    End of Session Equipment Utilized During Treatment: Gait belt Activity Tolerance: Patient limited by fatigue;Patient tolerated treatment well Patient left: in chair;with family/visitor present Nurse Communication: Mobility status PT Visit Diagnosis: Unsteadiness on feet (R26.81);Other (comment)     Time: 2072-1828 PT Time Calculation (min) (ACUTE ONLY): 9 min  Charges:  $Therapeutic Activity: 8-22 mins                     Lyanne Co, DPT Acute Rehabilitation Services [phone]   [place] 04/02/2020, 10:50 AM

## 2020-04-02 NOTE — Evaluation (Signed)
Occupational Therapy Evaluation Patient Details Name: Brittany Middleton MRN: [id number] DOB: 1951-05-27 Today's Date: 04/02/2020    History of Present Illness Patient is a 69 y/o female who presents with left flank pain, nausea, SOB, fatigue, chronic cough. Recently diagnosed with UTI. Admitted with presumed pyelonphritis. PMH includes HTN, HLD and Hepatic steatosis.   Clinical Impression   PTA patient independent with ADls, IADLs. Admitted for above and limited by problem list below, including generalized weakness and decreased activity tolerance. Patient utilized RW initially for in room mobility with modified independent, minimal mobility without AD with supervision- pt guarded but functional.  Patient completing ADLs with supervision to modified independence.  Pt with good safety awareness and good setup for dc home. Educated on energy conservation techniques for activity progression, pt voiced understanding. Based on performance today, no further OT needs have been identified and OT will sign off.     Follow Up Recommendations  No OT follow up;Supervision - Intermittent    Equipment Recommendations  None recommended by OT    Recommendations for Other Services       Precautions / Restrictions Precautions Precautions: Fall Restrictions Weight Bearing Restrictions: No      Mobility Bed Mobility Overal bed mobility: Independent             General bed mobility comments: no assist required  Transfers Overall transfer level: Needs assistance Equipment used: None Transfers: Sit to/from Stand Sit to Stand: Supervision         General transfer comment: supervision for safety initally, fading to modified independent    Balance Overall balance assessment: Mild deficits observed, not formally tested                                         ADL either performed or assessed with clinical judgement   ADL Overall ADL's : Needs assistance/impaired      Grooming: Modified independent;Standing;Wash/dry hands   Upper Body Bathing: Modified independent;Sitting   Lower Body Bathing: Supervison/ safety;Sit to/from stand   Upper Body Dressing : Modified independent;Sitting   Lower Body Dressing: Sit to/from stand;Set up;Supervision/safety Lower Body Dressing Details (indicate cue type and reason): able to don socks, mod I sit to stand with RW  Toilet Transfer: Supervision/safety;Ambulation;RW   Toileting- Clothing Manipulation and Hygiene: Modified independent;Sit to/from stand       Functional mobility during ADLs: Supervision/safety;Rolling walker (or no AD) General ADL Comments: pt reports mild weakness but improving significantly, discussed energy conservation techniques      Vision   Vision Assessment?: No apparent visual deficits     Perception     Praxis      Pertinent Vitals/Pain Pain Assessment: No/denies pain     Hand Dominance Right   Extremity/Trunk Assessment Upper Extremity Assessment Upper Extremity Assessment: Overall WFL for tasks assessed   Lower Extremity Assessment Lower Extremity Assessment: Defer to PT evaluation       Communication Communication Communication: No difficulties   Cognition Arousal/Alertness: Awake/alert Behavior During Therapy: WFL for tasks assessed/performed Overall Cognitive Status: Within Functional Limits for tasks assessed                                     General Comments  initated mobility in room with RW, limited mobility without AD and supervision- pt guarded but functional  Exercises     Shoulder Instructions      Home Living Family/patient expects to be discharged to:: Private residence Living Arrangements: Parent (61 y/o mother) Available Help at Discharge: Family;Available 24 hours/day Type of Home: House Home Access: Level entry     Home Layout: One level     Bathroom Shower/Tub: Occupational psychologist: Handicapped  height     Home Equipment: Shower seat - built in;Bedside commode;Walker - 4 wheels          Prior Functioning/Environment Level of Independence: Independent        Comments: 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 Reports a few falls. Has been having exertional dyspnea        OT Problem List: Decreased strength;Decreased activity tolerance;Obesity;Decreased safety awareness;Impaired balance (sitting and/or standing)      OT Treatment/Interventions:      OT Goals(Current goals can be found in the care plan section) Acute Rehab OT Goals Patient Stated Goal: be able to walk my dog and garden OT Goal Formulation: With patient  OT Frequency:     Barriers to D/C:            Co-evaluation              AM-PAC OT "6 Clicks" Daily Activity     Outcome Measure Help from another person eating meals?: None Help from another person taking care of personal grooming?: A Little Help from another person toileting, which includes using toliet, bedpan, or urinal?: None Help from another person bathing (including washing, rinsing, drying)?: A Little Help from another person to put on and taking off regular upper body clothing?: None Help from another person to put on and taking off regular lower body clothing?: A Little 6 Click Score: 21   End of Session Equipment Utilized During Treatment: Rolling walker Nurse Communication: Mobility status  Activity Tolerance: Patient tolerated treatment well Patient left: with call bell/phone within reach;in chair  OT Visit Diagnosis: Other abnormalities of gait and mobility (R26.89);Muscle weakness (generalized) (M62.81)                Time: 7867-5449 OT Time Calculation (min): 14 min Charges:  OT General Charges $OT Visit: 1 Visit OT Evaluation $OT Eval Low Complexity: 1 Low  Jolaine Artist, OT Acute Rehabilitation Services Pager [phone] Office [phone]   Delight Stare 04/02/2020, 9:11 AM

## 2020-04-02 NOTE — Progress Notes (Signed)
DISCHARGE NOTE HOME Ellean Firman to be discharged Home per MD order. Discussed prescriptions and follow up appointments with the patient. Prescriptions given to patient; medication list explained in detail. Patient verbalized understanding.  Skin clean, dry and intact without evidence of skin break down, no evidence of skin tears noted. IV catheter discontinued intact. Site without signs and symptoms of complications. Dressing and pressure applied. Pt denies pain at the site currently. No complaints noted.  Patient free of lines, drains, and wounds.   An After Visit Summary (AVS) was printed and given to the patient. Patient escorted via wheelchair, and discharged home via private auto.  Orville Govern, RN3

## 2020-04-04 DIAGNOSIS — N390 Urinary tract infection, site not specified: Secondary | ICD-10-CM | POA: Insufficient documentation

## 2020-04-04 DIAGNOSIS — Z87898 Personal history of other specified conditions: Secondary | ICD-10-CM | POA: Insufficient documentation

## 2020-04-04 DIAGNOSIS — E7800 Pure hypercholesterolemia, unspecified: Secondary | ICD-10-CM | POA: Insufficient documentation

## 2020-04-04 DIAGNOSIS — I10 Essential (primary) hypertension: Secondary | ICD-10-CM | POA: Insufficient documentation

## 2020-04-04 DIAGNOSIS — K760 Fatty (change of) liver, not elsewhere classified: Secondary | ICD-10-CM | POA: Insufficient documentation

## 2020-04-04 DIAGNOSIS — M5090 Cervical disc disorder, unspecified, unspecified cervical region: Secondary | ICD-10-CM | POA: Insufficient documentation

## 2020-04-04 DIAGNOSIS — M519 Unspecified thoracic, thoracolumbar and lumbosacral intervertebral disc disorder: Secondary | ICD-10-CM | POA: Insufficient documentation

## 2020-04-04 DIAGNOSIS — B9629 Other Escherichia coli [E. coli] as the cause of diseases classified elsewhere: Secondary | ICD-10-CM | POA: Insufficient documentation

## 2020-04-04 LAB — CULTURE, BLOOD (ROUTINE X 2)
Culture: NO GROWTH
Culture: NO GROWTH
Special Requests: ADEQUATE
Special Requests: ADEQUATE

## 2020-04-07 ENCOUNTER — Other Ambulatory Visit: Payer: Self-pay

## 2020-04-07 ENCOUNTER — Ambulatory Visit (INDEPENDENT_AMBULATORY_CARE_PROVIDER_SITE_OTHER): Payer: Medicare Other | Admitting: Cardiology

## 2020-04-07 ENCOUNTER — Encounter: Payer: Self-pay | Admitting: Cardiology

## 2020-04-07 VITALS — BP 142/64 | HR 66 | Ht 65.0 in | Wt 268.0 lb

## 2020-04-07 DIAGNOSIS — I2541 Coronary artery aneurysm: Secondary | ICD-10-CM

## 2020-04-07 DIAGNOSIS — I10 Essential (primary) hypertension: Secondary | ICD-10-CM

## 2020-04-07 DIAGNOSIS — E782 Mixed hyperlipidemia: Secondary | ICD-10-CM

## 2020-04-07 NOTE — Progress Notes (Signed)
Cardiology Office Note:    Date:  04/07/2020   ID:  Brittany Middleton, DOB 19-Aug-1950, MRN [id number]  PCP:  Maurice Small, MD  Cardiologist:  Jenean Lindau, MD   Referring MD: Maurice Small, MD    ASSESSMENT:    1. Coronary aneurysm   2. Mixed hyperlipidemia   3. Hypertension, unspecified type   4. Morbid obesity ([place])    PLAN:    In order of problems listed above:  1. Primary prevention stressed with the patient.  Importance of compliance with diet medication stressed any vocalized understanding.  Importance of regular exercise stressed and she was advised to continue walking at least half an hour a day 5 days a week.  She promises to do so. 2. Coronary artery aneurysm: Stable at this time.  Asymptomatic 3. Mixed dyslipidemia: Lipids were reviewed diet was emphasized.  Lifestyle modification was urged. 4. Essential hypertension: Blood pressure is elevated and she wants to start lisinopril and I agree.  She will go back to the same dose as previous.  She will see her primary care physician in the next week or 2 and she will have follow-up blood pressure and blood work if she is Chem-7. 5. Morbid obesity.  Diet was emphasized.  Weight reduction was stressed risks of obesity explained. 6. Patient will be seen in follow-up appointment in 6 months or earlier if the patient has any concerns    Medication Adjustments/Labs and Tests Ordered: Current medicines are reviewed at length with the patient today.  Concerns regarding medicines are outlined above.  No orders of the defined types were placed in this encounter.  No orders of the defined types were placed in this encounter.    No chief complaint on file.    History of Present Illness:    Brittany Middleton is a 69 y.o. female.  Patient has past medical history of coronary aneurysm, essential hypertension dyslipidemia and diabetes mellitus.  She is morbidly obese.  She denies any problems at this time and takes care of  activities of daily living.  No chest pain orthopnea or PND.  She tells me she walks a mile without any problems and walks the dog.  She recently was admitted with urinary tract infection and was treated.  Because of low blood pressure issues her lisinopril was discontinued.  Past Medical History:  Diagnosis Date  . Acute blood loss anemia 07/17/2018  . Anemia 07/17/2018  . Ascending aortic aneurysm ([place]) 05/23/2019  . Cervical disc disease    MRI 2016  . Chest pain with moderate risk for cardiac etiology 02/27/2018  . 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 Gastrointestinal bleeding 07/25/2018  . Gastrointestinal hemorrhage with melena 07/16/2018  . Hepatic steatosis    per imaging 2016, 2019  . Hiatal hernia   . History of palpitations    Holter monitor, 2017: NSR, occasional PVC, PACs, no arrhythmia  . HLD (hyperlipidemia) 02/27/2018  . HTN (hypertension) 02/27/2018  . Hypercholesteremia   . Hypercholesterolemia 04/07/2017  . Hypertension   . Hypertensive disorder 04/07/2017  . Hypokalemia 07/17/2018  . Lumbar disc disease    MRI, 2017   . Morbid obesity ([place]) 05/22/2018  . Overweight 03/03/2018  . Palpitations 08/19/2015  . Pyelonephritis 03/30/2020  . UTI (urinary tract infection)     Past Surgical History:  Procedure Laterality Date  . BLADDER REPAIR    . BREAST EXCISIONAL BIOPSY Left   . CARPAL TUNNEL RELEASE Right   . CATARACT EXTRACTION, BILATERAL  2014  .  COLONOSCOPY WITH PROPOFOL N/A 07/18/2018   Procedure: COLONOSCOPY WITH PROPOFOL;  Surgeon: Laurence Spates, MD;  Location: [facility];  Service: Endoscopy;  Laterality: N/A;  . ESOPHAGOGASTRODUODENOSCOPY (EGD) WITH PROPOFOL N/A 07/17/2018   Procedure: ESOPHAGOGASTRODUODENOSCOPY (EGD) WITH PROPOFOL;  Surgeon: Laurence Spates, MD;  Location: [facility] [facility];  Service: Endoscopy;  Laterality: N/A;  . REPAIR RECTOCELE      Current Medications: Current Meds  Medication Sig  . acetaminophen (TYLENOL) 500 MG tablet Take 1,000 mg by mouth  every 6 (six) hours as needed for mild pain, moderate pain, fever or headache.  . albuterol (VENTOLIN HFA) 108 (90 Base) MCG/ACT inhaler Inhale 2 puffs into the lungs every 6 (six) hours as needed for wheezing or shortness of breath.  Marland Kitchen BIOTIN PO Take 1 tablet by mouth daily.  . Cholecalciferol (VITAMIN D-3) 25 MCG (1000 UT) CAPS Take 1,000 Units by mouth daily.  . clopidogrel (PLAVIX) 75 MG tablet Take 1 tablet (75 mg total) by mouth daily.  . Coenzyme Q10 (CO Q 10 PO) Take 1 capsule by mouth daily.   . fluticasone (FLONASE) 50 MCG/ACT nasal spray Place 1 spray into both nostrils daily.  . IRON PO Take 1 tablet by mouth 2 (two) times a week.   Javier Docker Oil 300 MG CAPS Take 300 mg by mouth daily.   . Melatonin 5 MG CHEW Chew 5 mg by mouth at bedtime.   . Multiple Vitamins-Minerals (MULTIVITAMIN WITH MINERALS) tablet Take 1 tablet by mouth daily.  . pantoprazole (PROTONIX) 40 MG tablet Take 1 tablet (40 mg total) by mouth daily.     Allergies:   Ciprofloxacin and Codeine   Social History   Socioeconomic History  . Marital status: Divorced    Spouse name: Not on file  . Number of children: 2  . Years of education: Not on file  . Highest education level: Master's degree (e.g., MA, MS, MEng, MEd, MSW, MBA)  Occupational History  . Occupation: Retired  Tobacco Use  . Smoking status: Never Smoker  . Smokeless tobacco: Never Used  Vaping Use  . Vaping Use: Never used  Substance and Sexual Activity  . Alcohol use: No  . Drug use: No  . Sexual activity: Not on file  Other Topics Concern  . Not on file  Social History Narrative  . Not on file   Social Determinants of Health   Financial Resource Strain:   . Difficulty of Paying Living Expenses: Not on file  Food Insecurity:   . Worried About Charity fundraiser in the Last Year: Not on file  . Ran Out of Food in the Last Year: Not on file  Transportation Needs:   . Lack of Transportation (Medical): Not on file  . Lack of  Transportation (Non-Medical): Not on file  Physical Activity:   . Days of Exercise per Week: Not on file  . Minutes of Exercise per Session: Not on file  Stress:   . Feeling of Stress : Not on file  Social Connections:   . Frequency of Communication with Friends and Family: Not on file  . Frequency of Social Gatherings with Friends and Family: Not on file  . Attends Religious Services: Not on file  . Active Member of Clubs or Organizations: Not on file  . Attends Archivist Meetings: Not on file  . Marital Status: Not on file     Family History: The patient's family history includes Breast cancer (age of onset: 89) in her mother;  CAD in her mother.  ROS:   Please see the history of present illness.    All other systems reviewed and are negative.  EKGs/Labs/Other Studies Reviewed:    The following studies were reviewed today: I discussed my findings with the patient at length.   Recent Labs: 12/20/2019: TSH 1.580 03/30/2020: B Natriuretic Peptide 95.5 04/01/2020: ALT 25; Magnesium 2.2 04/02/2020: BUN 7; Creatinine, Ser 0.72; Hemoglobin 11.0; Platelets 173; Potassium 3.8; Sodium 140  Recent Lipid Panel    Component Value Date/Time   CHOL 177 02/04/2020 1036   TRIG 166 (H) 02/04/2020 1036   HDL 48 02/04/2020 1036   CHOLHDL 3.7 02/04/2020 1036   CHOLHDL 4.5 02/28/2018 0345   VLDL 41 (H) 02/28/2018 0345   LDLCALC 100 (H) 02/04/2020 1036    Physical Exam:    VS:  BP (!) 142/64   Pulse 66   Ht 5\' 5"  (1.651 m)   Wt 268 lb (121.6 kg)   SpO2 94%   BMI 44.60 kg/m     Wt Readings from Last 3 Encounters:  04/07/20 268 lb (121.6 kg)  04/01/20 276 lb 10.8 oz (125.5 kg)  12/21/19 265 lb (120.2 kg)     GEN: Patient is in no acute distress HEENT: Normal NECK: No JVD; No carotid bruits LYMPHATICS: No lymphadenopathy CARDIAC: Hear sounds regular, 2/6 systolic murmur at the apex. RESPIRATORY:  Clear to auscultation without rales, wheezing or rhonchi  ABDOMEN:  Soft, non-tender, non-distended MUSCULOSKELETAL:  No edema; No deformity  SKIN: Warm and dry NEUROLOGIC:  Alert and oriented x 3 PSYCHIATRIC:  Normal affect   Signed, Jenean Lindau, MD  04/07/2020 1:40 PM     [facility]

## 2020-04-07 NOTE — Patient Instructions (Addendum)
Medication Instructions:  Restart Lisinopril/Hydrochlorothiazide  *If you need a refill on your cardiac medications before your next appointment, please call your pharmacy*   Lab Work: None If you have labs (blood work) drawn today and your tests are completely normal, you will receive your results only by: Marland Kitchen MyChart Message (if you have MyChart) OR . A paper copy in the mail If you have any lab test that is abnormal or we need to change your treatment, we will call you to review the results.   Testing/Procedures: None   Follow-Up: At [facility], you and your health needs are our priority.  As part of our continuing mission to provide you with exceptional heart care, we have created designated Provider Care Teams.  These Care Teams include your primary Cardiologist (physician) and Advanced Practice Providers (APPs -  Physician Assistants and Nurse Practitioners) who all work together to provide you with the care you need, when you need it.  We recommend signing up for the patient portal called "MyChart".  Sign up information is provided on this After Visit Summary.  MyChart is used to connect with patients for Virtual Visits (Telemedicine).  Patients are able to view lab/test results, encounter notes, upcoming appointments, etc.  Non-urgent messages can be sent to your provider as well.   To learn more about what you can do with MyChart, go to [email or web address].    Your next appointment:   6 month(s)  The format for your next appointment:   In Person  Provider:   Jyl Heinz, MD   Other Instructions

## 2020-04-14 ENCOUNTER — Other Ambulatory Visit: Payer: Self-pay | Admitting: Physician Assistant

## 2020-04-14 DIAGNOSIS — K219 Gastro-esophageal reflux disease without esophagitis: Secondary | ICD-10-CM

## 2020-04-22 ENCOUNTER — Ambulatory Visit
Admission: RE | Admit: 2020-04-22 | Discharge: 2020-04-22 | Disposition: A | Discharge status: Home / Self Care | Payer: Medicare Other | Source: Ambulatory Visit | Attending: Physician Assistant | Admitting: Physician Assistant

## 2020-04-22 DIAGNOSIS — K219 Gastro-esophageal reflux disease without esophagitis: Secondary | ICD-10-CM

## 2020-04-25 ENCOUNTER — Encounter (HOSPITAL_BASED_OUTPATIENT_CLINIC_OR_DEPARTMENT_OTHER): Payer: Self-pay | Admitting: Emergency Medicine

## 2020-04-25 ENCOUNTER — Inpatient Hospital Stay (HOSPITAL_BASED_OUTPATIENT_CLINIC_OR_DEPARTMENT_OTHER)
Admission: EM | Admit: 2020-04-25 | Discharge: 2020-04-29 | DRG: 872 | Disposition: A | Discharge status: Home / Self Care | Payer: Medicare Other | Attending: Internal Medicine | Admitting: Internal Medicine

## 2020-04-25 ENCOUNTER — Other Ambulatory Visit: Payer: Self-pay

## 2020-04-25 ENCOUNTER — Emergency Department (HOSPITAL_BASED_OUTPATIENT_CLINIC_OR_DEPARTMENT_OTHER): Payer: Medicare Other

## 2020-04-25 ENCOUNTER — Other Ambulatory Visit: Payer: Self-pay | Admitting: Pulmonary Disease

## 2020-04-25 DIAGNOSIS — K449 Diaphragmatic hernia without obstruction or gangrene: Secondary | ICD-10-CM | POA: Diagnosis present

## 2020-04-25 DIAGNOSIS — I2541 Coronary artery aneurysm: Secondary | ICD-10-CM | POA: Diagnosis present

## 2020-04-25 DIAGNOSIS — D649 Anemia, unspecified: Secondary | ICD-10-CM | POA: Diagnosis not present

## 2020-04-25 DIAGNOSIS — Z881 Allergy status to other antibiotic agents status: Secondary | ICD-10-CM

## 2020-04-25 DIAGNOSIS — Z9989 Dependence on other enabling machines and devices: Secondary | ICD-10-CM | POA: Diagnosis not present

## 2020-04-25 DIAGNOSIS — R0902 Hypoxemia: Secondary | ICD-10-CM | POA: Diagnosis not present

## 2020-04-25 DIAGNOSIS — G4733 Obstructive sleep apnea (adult) (pediatric): Secondary | ICD-10-CM | POA: Diagnosis present

## 2020-04-25 DIAGNOSIS — I10 Essential (primary) hypertension: Secondary | ICD-10-CM | POA: Diagnosis present

## 2020-04-25 DIAGNOSIS — Z20822 Contact with and (suspected) exposure to covid-19: Secondary | ICD-10-CM | POA: Diagnosis present

## 2020-04-25 DIAGNOSIS — A419 Sepsis, unspecified organism: Principal | ICD-10-CM | POA: Diagnosis present

## 2020-04-25 DIAGNOSIS — E7800 Pure hypercholesterolemia, unspecified: Secondary | ICD-10-CM | POA: Diagnosis present

## 2020-04-25 DIAGNOSIS — R0602 Shortness of breath: Secondary | ICD-10-CM | POA: Diagnosis not present

## 2020-04-25 DIAGNOSIS — R0609 Other forms of dyspnea: Secondary | ICD-10-CM

## 2020-04-25 DIAGNOSIS — E669 Obesity, unspecified: Secondary | ICD-10-CM | POA: Diagnosis present

## 2020-04-25 DIAGNOSIS — R0600 Dyspnea, unspecified: Secondary | ICD-10-CM

## 2020-04-25 DIAGNOSIS — R9431 Abnormal electrocardiogram [ECG] [EKG]: Secondary | ICD-10-CM | POA: Diagnosis present

## 2020-04-25 DIAGNOSIS — R1084 Generalized abdominal pain: Secondary | ICD-10-CM | POA: Diagnosis present

## 2020-04-25 DIAGNOSIS — G709 Myoneural disorder, unspecified: Secondary | ICD-10-CM | POA: Diagnosis present

## 2020-04-25 DIAGNOSIS — E66813 Obesity, class 3: Secondary | ICD-10-CM | POA: Diagnosis present

## 2020-04-25 DIAGNOSIS — E876 Hypokalemia: Secondary | ICD-10-CM | POA: Diagnosis present

## 2020-04-25 DIAGNOSIS — Z87440 Personal history of urinary (tract) infections: Secondary | ICD-10-CM

## 2020-04-25 DIAGNOSIS — R5381 Other malaise: Secondary | ICD-10-CM | POA: Diagnosis present

## 2020-04-25 DIAGNOSIS — E785 Hyperlipidemia, unspecified: Secondary | ICD-10-CM | POA: Diagnosis present

## 2020-04-25 DIAGNOSIS — Z8249 Family history of ischemic heart disease and other diseases of the circulatory system: Secondary | ICD-10-CM

## 2020-04-25 DIAGNOSIS — M7989 Other specified soft tissue disorders: Secondary | ICD-10-CM | POA: Diagnosis not present

## 2020-04-25 DIAGNOSIS — R7989 Other specified abnormal findings of blood chemistry: Secondary | ICD-10-CM | POA: Diagnosis present

## 2020-04-25 DIAGNOSIS — Z7722 Contact with and (suspected) exposure to environmental tobacco smoke (acute) (chronic): Secondary | ICD-10-CM | POA: Diagnosis present

## 2020-04-25 DIAGNOSIS — I2510 Atherosclerotic heart disease of native coronary artery without angina pectoris: Secondary | ICD-10-CM | POA: Diagnosis present

## 2020-04-25 DIAGNOSIS — Z885 Allergy status to narcotic agent status: Secondary | ICD-10-CM

## 2020-04-25 DIAGNOSIS — Z803 Family history of malignant neoplasm of breast: Secondary | ICD-10-CM

## 2020-04-25 DIAGNOSIS — N390 Urinary tract infection, site not specified: Secondary | ICD-10-CM | POA: Diagnosis present

## 2020-04-25 DIAGNOSIS — Z6841 Body Mass Index (BMI) 40.0 and over, adult: Secondary | ICD-10-CM | POA: Diagnosis not present

## 2020-04-25 DIAGNOSIS — K219 Gastro-esophageal reflux disease without esophagitis: Secondary | ICD-10-CM | POA: Diagnosis present

## 2020-04-25 DIAGNOSIS — E872 Acidosis: Secondary | ICD-10-CM | POA: Diagnosis present

## 2020-04-25 DIAGNOSIS — R6520 Severe sepsis without septic shock: Secondary | ICD-10-CM | POA: Diagnosis present

## 2020-04-25 DIAGNOSIS — Z7902 Long term (current) use of antithrombotics/antiplatelets: Secondary | ICD-10-CM

## 2020-04-25 DIAGNOSIS — Z79899 Other long term (current) drug therapy: Secondary | ICD-10-CM

## 2020-04-25 HISTORY — DX: Sepsis, unspecified organism: A41.9

## 2020-04-25 LAB — RESPIRATORY PANEL BY RT PCR (FLU A&B, COVID)
Influenza A by PCR: NEGATIVE
Influenza B by PCR: NEGATIVE
SARS Coronavirus 2 by RT PCR: NEGATIVE

## 2020-04-25 LAB — APTT: aPTT: 27 seconds (ref 24–36)

## 2020-04-25 LAB — CBC WITH DIFFERENTIAL/PLATELET
Abs Immature Granulocytes: 0.05 10*3/uL (ref 0.00–0.07)
Basophils Absolute: 0 10*3/uL (ref 0.0–0.1)
Basophils Relative: 0 %
Eosinophils Absolute: 0.2 10*3/uL (ref 0.0–0.5)
Eosinophils Relative: 2 %
HCT: 40.8 % (ref 36.0–46.0)
Hemoglobin: 13.5 g/dL (ref 12.0–15.0)
Immature Granulocytes: 1 %
Lymphocytes Relative: 1 %
Lymphs Abs: 0.1 10*3/uL — ABNORMAL LOW (ref 0.7–4.0)
MCH: 30.1 pg (ref 26.0–34.0)
MCHC: 33.1 g/dL (ref 30.0–36.0)
MCV: 90.9 fL (ref 80.0–100.0)
Monocytes Absolute: 0.2 10*3/uL (ref 0.1–1.0)
Monocytes Relative: 2 %
Neutro Abs: 9.7 10*3/uL — ABNORMAL HIGH (ref 1.7–7.7)
Neutrophils Relative %: 94 %
Platelets: 179 10*3/uL (ref 150–400)
RBC: 4.49 MIL/uL (ref 3.87–5.11)
RDW: 13.7 % (ref 11.5–15.5)
WBC: 10.3 10*3/uL (ref 4.0–10.5)
nRBC: 0 % (ref 0.0–0.2)

## 2020-04-25 LAB — LACTIC ACID, PLASMA
Lactic Acid, Venous: 2.7 mmol/L (ref 0.5–1.9)
Lactic Acid, Venous: 2.2 mmol/L (ref 0.5–1.9)

## 2020-04-25 LAB — URINALYSIS, ROUTINE W REFLEX MICROSCOPIC
Bilirubin Urine: NEGATIVE
Glucose, UA: 100 mg/dL — AB
Ketones, ur: NEGATIVE mg/dL
Nitrite: POSITIVE — AB
Protein, ur: NEGATIVE mg/dL
Specific Gravity, Urine: 1.01 (ref 1.005–1.030)
pH: 5.5 (ref 5.0–8.0)

## 2020-04-25 LAB — COMPREHENSIVE METABOLIC PANEL
ALT: 18 U/L (ref 0–44)
AST: 27 U/L (ref 15–41)
Albumin: 3.9 g/dL (ref 3.5–5.0)
Alkaline Phosphatase: 45 U/L (ref 38–126)
Anion gap: 12 (ref 5–15)
BUN: 13 mg/dL (ref 8–23)
CO2: 25 mmol/L (ref 22–32)
Calcium: 8.7 mg/dL — ABNORMAL LOW (ref 8.9–10.3)
Chloride: 101 mmol/L (ref 98–111)
Creatinine, Ser: 0.81 mg/dL (ref 0.44–1.00)
GFR, Estimated: >60 mL/min (ref >60)
Glucose, Bld: 131 mg/dL — ABNORMAL HIGH (ref 70–99)
Potassium: 3.1 mmol/L — ABNORMAL LOW (ref 3.5–5.1)
Sodium: 138 mmol/L (ref 135–145)
Total Bilirubin: 1.2 mg/dL (ref 0.3–1.2)
Total Protein: 6.8 g/dL (ref 6.5–8.1)

## 2020-04-25 LAB — URINALYSIS, MICROSCOPIC (REFLEX)

## 2020-04-25 LAB — PROTIME-INR
INR: 1 (ref 0.8–1.2)
Prothrombin Time: 13.2 seconds (ref 11.4–15.2)

## 2020-04-25 MED ORDER — VANCOMYCIN HCL IN DEXTROSE 1-5 GM/200ML-% IV SOLN
1000.0000 mg | Freq: Once | INTRAVENOUS | Status: AC
  Administered 2020-04-25: 1000 mg via INTRAVENOUS
  Filled 2020-04-25: qty 200

## 2020-04-25 MED ORDER — LACTATED RINGERS IV BOLUS (SEPSIS)
1000.0000 mL | Freq: Once | INTRAVENOUS | Status: AC
  Administered 2020-04-25: 1000 mL via INTRAVENOUS

## 2020-04-25 MED ORDER — POTASSIUM CHLORIDE 20 MEQ PO PACK
40.0000 meq | PACK | Freq: Once | ORAL | Status: AC
  Administered 2020-04-25: 40 meq via ORAL
  Filled 2020-04-25: qty 2

## 2020-04-25 MED ORDER — VANCOMYCIN HCL IN DEXTROSE 1-5 GM/200ML-% IV SOLN
1000.0000 mg | Freq: Two times a day (BID) | INTRAVENOUS | Status: DC
  Administered 2020-04-26: 1000 mg via INTRAVENOUS
  Filled 2020-04-25 (×2): qty 200

## 2020-04-25 MED ORDER — ACETAMINOPHEN 500 MG PO TABS
ORAL_TABLET | ORAL | Status: AC
  Filled 2020-04-25: qty 2

## 2020-04-25 MED ORDER — ENOXAPARIN SODIUM 40 MG/0.4ML ~~LOC~~ SOLN: 40.0000 mg | SUBCUTANEOUS | Status: DC

## 2020-04-25 MED ORDER — METRONIDAZOLE IN NACL 5-0.79 MG/ML-% IV SOLN
500.0000 mg | Freq: Once | INTRAVENOUS | Status: AC
  Administered 2020-04-25: 500 mg via INTRAVENOUS
  Filled 2020-04-25: qty 100

## 2020-04-25 MED ORDER — ACETAMINOPHEN 325 MG PO TABS
650.0000 mg | ORAL_TABLET | Freq: Once | ORAL | Status: AC
  Administered 2020-04-25: 650 mg via ORAL

## 2020-04-25 MED ORDER — ONDANSETRON HCL 4 MG/2ML IJ SOLN
4.0000 mg | Freq: Once | INTRAMUSCULAR | Status: AC
  Administered 2020-04-25: 4 mg via INTRAVENOUS
  Filled 2020-04-25: qty 2

## 2020-04-25 MED ORDER — SODIUM CHLORIDE 0.9 % IV SOLN: Freq: Once | INTRAVENOUS | Status: AC

## 2020-04-25 MED ORDER — SODIUM CHLORIDE 0.9 % IV SOLN
2.0000 g | Freq: Three times a day (TID) | INTRAVENOUS | Status: DC
  Administered 2020-04-25 – 2020-04-26 (×2): 2 g via INTRAVENOUS
  Filled 2020-04-25 (×3): qty 2

## 2020-04-25 MED ORDER — SODIUM CHLORIDE 0.9 % IV SOLN
2.0000 g | Freq: Once | INTRAVENOUS | Status: AC
  Administered 2020-04-25: 2 g via INTRAVENOUS

## 2020-04-25 MED ORDER — ENOXAPARIN SODIUM 60 MG/0.6ML ~~LOC~~ SOLN
60.0000 mg | SUBCUTANEOUS | Status: DC
  Administered 2020-04-26 – 2020-04-28 (×3): 60 mg via SUBCUTANEOUS
  Filled 2020-04-25 (×3): qty 0.6

## 2020-04-25 MED ORDER — ACETAMINOPHEN 500 MG PO TABS
1000.0000 mg | ORAL_TABLET | Freq: Once | ORAL | Status: AC
  Administered 2020-04-25: 1000 mg via ORAL
  Filled 2020-04-25: qty 2

## 2020-04-25 MED ORDER — ACETAMINOPHEN 650 MG RE SUPP: 650.0000 mg | Freq: Four times a day (QID) | RECTAL | Status: DC | PRN

## 2020-04-25 MED ORDER — ACETAMINOPHEN 325 MG PO TABS
650.0000 mg | ORAL_TABLET | Freq: Four times a day (QID) | ORAL | Status: DC | PRN
  Administered 2020-04-26 – 2020-04-28 (×6): 650 mg via ORAL
  Filled 2020-04-25 (×8): qty 2

## 2020-04-25 MED ORDER — KETOROLAC TROMETHAMINE 30 MG/ML IJ SOLN
15.0000 mg | Freq: Once | INTRAMUSCULAR | Status: AC
  Administered 2020-04-25: 15 mg via INTRAVENOUS
  Filled 2020-04-25: qty 1

## 2020-04-25 MED ORDER — SODIUM CHLORIDE 0.9 % IV SOLN
INTRAVENOUS | Status: AC
  Filled 2020-04-25: qty 2

## 2020-04-25 MED ORDER — SODIUM CHLORIDE 0.9 % IV SOLN: INTRAVENOUS | Status: DC

## 2020-04-25 MED ORDER — ONDANSETRON HCL 4 MG/2ML IJ SOLN: 4.0000 mg | Freq: Four times a day (QID) | INTRAMUSCULAR | Status: DC | PRN

## 2020-04-25 MED ORDER — ONDANSETRON HCL 4 MG PO TABS: 4.0000 mg | ORAL_TABLET | Freq: Four times a day (QID) | ORAL | Status: DC | PRN

## 2020-04-25 NOTE — Progress Notes (Signed)
MD aware of need for pt to have 30cc/kg dt decreased BP.  BP came up quickly after cuff was readjusted, per MD - and w/ hx of CHF, MD didn't want to give all the fluid.  It is documented in her note.

## 2020-04-25 NOTE — Progress Notes (Signed)
PMH: HTN, HLD, obesity, GIB, ascending aortic aneurysm, recent UTI about 3 weeks ago.  Presented with fever, dysuria, increased frequency, generalized abdominal pain and shortness of breath for 1 day.  In ED, febrile to 103.2. RR 18> 26> 22.  BP and saturation within normal.  WBC 10.3 (higher than baseline).  LA 2.7> 2.2.  UA consistent with UTI.  CXR without significant finding.  Received a liter of LR bolus.  Started on cefepime, Flagyl and vancomycin.  Being admitted for severe sepsis due to UTI.  COVID-19 PCR negative.  Accepted to telemetry bed.

## 2020-04-25 NOTE — ED Triage Notes (Addendum)
Presents with fever and shortness of breath, recent UTI 3 weeks ago. Denies chest pain. 1000 mg tylenol at 530 am.

## 2020-04-25 NOTE — Progress Notes (Signed)
Pt's temp elevated on admit, IV antibiotic started , Tylenol given, sepsis protocol started, made MD Humphrey Rolls aware, no new orders at this time.

## 2020-04-25 NOTE — Progress Notes (Signed)
Pharmacy Antibiotic Note  Brittany Middleton is a 69 y.o. female admitted on 04/25/2020 with Infection of uknown source.  Pharmacy has been consulted for Cefepime and vancomycin dosing.   WBC wnl, SCr 0.81, CrCl ~ 88 mL/min.   Plan: -Cefepime 2 gm IV Q 8 hours -Vancomycin 2 gm IV load followed by vancomycin 1 gm IV Q 12 hours -Monitor CBC, renal fx, cultures and clinical progress -VT as indicated    Temp (24hrs), Avg:103.2 F (39.6 C), Min:103.2 F (39.6 C), Max:103.2 F (39.6 C)  No results for input(s): WBC, CREATININE, LATICACIDVEN, VANCOTROUGH, VANCOPEAK, VANCORANDOM, GENTTROUGH, GENTPEAK, GENTRANDOM, TOBRATROUGH, TOBRAPEAK, TOBRARND, AMIKACINPEAK, AMIKACINTROU, AMIKACIN in the last 168 hours.  CrCl cannot be calculated (Patient's most recent lab result is older than the maximum 21 days allowed.).    Allergies  Allergen Reactions  . Ciprofloxacin Other (See Comments)    Caused PAIN IN HANDS   . Codeine Nausea And Vomiting    Antimicrobials this admission: Cefepime 11/5 >>  Vancomycin 11/5 >>  Flagyl 11/5 >>   Dose adjustments this admission: None  Microbiology results: 11/5 BCx:  11/5 UCx:     Thank you for allowing pharmacy to be a part of this patient's care.  Albertina Parr, PharmD., BCPS, BCCCP Clinical Pharmacist Please refer to [facility] for unit-specific pharmacist

## 2020-04-25 NOTE — ED Provider Notes (Signed)
[facility] EMERGENCY DEPARTMENT Provider Note   CSN: [id number] Arrival date & time: 04/25/20  1032     History Chief Complaint  Patient presents with  . Fever    Brittany Middleton is a 69 y.o. female.  Brittany Middleton is a 69 y.o. female with a history of hypertension, hyperlipidemia, obesity, GI bleeding, ascending aortic aneurysm, anemia, who presents to the emergency department for evaluation of fever, dysuria and shortness of breath.  She had a similar symptoms about 3 weeks ago and was admitted to the hospital with a UTI.  She states that her symptoms started last night when she had a sudden onset sensation of dysuria and urinary frequency.  She states she feels that she needs to go every few minutes but is only able to pass a few tablespoons of urine and she has noted some blood in her urine.  She states she also started feeling very short of breath during this time initially she thought this was just due to the pain.  She states when she was admitted to the hospital a few weeks ago she had similar shortness of breath with the symptoms.  She has had an occasional nonproductive cough and she said it feels like her lungs are burning.  She is febrile to 103.2 on arrival and states that she last took Tylenol at 5:30 in the morning.  She denies chest pain.  Reports some suprapubic abdominal pain and that anytime she is try to eat or drink she has been vomiting, states she has not been able to keep anything down this morning.  Reports that she has not had any diarrhea or constipation.  No lower extremity swelling.  Has had both Covid vaccines and a booster and no known sick contacts.  I reviewed her most recent hospital admission, her urine culture did not grow out a specific species but she had been placed on Macrobid in an urgent care prior to coming into the hospital which likely affected this, blood cultures were negative.        Past Medical History:  Diagnosis Date  .  Acute blood loss anemia 07/17/2018  . Anemia 07/17/2018  . Ascending aortic aneurysm ([place]) 05/23/2019  . Cervical disc disease    MRI 2016  . Chest pain with moderate risk for cardiac etiology 02/27/2018  . 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 Gastrointestinal bleeding 07/25/2018  . Gastrointestinal hemorrhage with melena 07/16/2018  . Hepatic steatosis    per imaging 2016, 2019  . Hiatal hernia   . History of palpitations    Holter monitor, 2017: NSR, occasional PVC, PACs, no arrhythmia  . HLD (hyperlipidemia) 02/27/2018  . HTN (hypertension) 02/27/2018  . Hypercholesteremia   . Hypercholesterolemia 04/07/2017  . Hypertension   . Hypertensive disorder 04/07/2017  . Hypokalemia 07/17/2018  . Lumbar disc disease    MRI, 2017   . Morbid obesity ([place]) 05/22/2018  . Overweight 03/03/2018  . Palpitations 08/19/2015  . Pyelonephritis 03/30/2020  . UTI (urinary tract infection)     Patient Active Problem List   Diagnosis Date Noted  . Cervical disc disease   . Hepatic steatosis   . History of palpitations   . Hypercholesteremia   . Hypertension   . Lumbar disc disease   . UTI (urinary tract infection)   . Pyelonephritis 03/30/2020  . Ascending aortic aneurysm ([place]) 05/23/2019  . Gastrointestinal bleeding 07/25/2018  . Hiatal hernia   . Anemia 07/17/2018  . Acute blood loss anemia 07/17/2018  .  Hypokalemia 07/17/2018  . Gastrointestinal hemorrhage with melena 07/16/2018  . Morbid obesity ([place]) 05/22/2018  . Overweight 03/03/2018  . 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 Chest pain with moderate risk for cardiac etiology 02/27/2018  . HTN (hypertension) 02/27/2018  . HLD (hyperlipidemia) 02/27/2018  . Hypercholesterolemia 04/07/2017  . Hypertensive disorder 04/07/2017  . Palpitations 08/19/2015    Past Surgical History:  Procedure Laterality Date  . BLADDER REPAIR    . BREAST EXCISIONAL BIOPSY Left   . CARPAL TUNNEL RELEASE Right   . CATARACT EXTRACTION, BILATERAL  2014  . COLONOSCOPY WITH  PROPOFOL N/A 07/18/2018   Procedure: COLONOSCOPY WITH PROPOFOL;  Surgeon: Laurence Spates, MD;  Location: [facility];  Service: Endoscopy;  Laterality: N/A;  . ESOPHAGOGASTRODUODENOSCOPY (EGD) WITH PROPOFOL N/A 07/17/2018   Procedure: ESOPHAGOGASTRODUODENOSCOPY (EGD) WITH PROPOFOL;  Surgeon: Laurence Spates, MD;  Location: [facility];  Service: Endoscopy;  Laterality: N/A;  . REPAIR RECTOCELE       OB History   No obstetric history on file.     Family History  Problem Relation Age of Onset  . CAD Mother   . Breast cancer Mother 62    Social History   Tobacco Use  . Smoking status: Never Smoker  . Smokeless tobacco: Never Used  Vaping Use  . Vaping Use: Never used  Substance Use Topics  . Alcohol use: No  . Drug use: No    Home Medications Prior to Admission medications   Medication Sig Start Date End Date Taking? Authorizing Provider  acetaminophen (TYLENOL) 500 MG tablet Take 1,000 mg by mouth every 6 (six) hours as needed for mild pain, moderate pain, fever or headache.    [provider]  albuterol (VENTOLIN HFA) 108 (90 Base) MCG/ACT inhaler Inhale 2 puffs into the lungs every 6 (six) hours as needed for wheezing or shortness of breath. 04/01/20   Regalado, Belkys A, MD  BIOTIN PO Take 1 tablet by mouth daily.    [provider]  Cholecalciferol (VITAMIN D-3) 25 MCG (1000 UT) CAPS Take 1,000 Units by mouth daily.    [provider]  clopidogrel (PLAVIX) 75 MG tablet Take 1 tablet (75 mg total) by mouth daily. 06/28/19   Revankar, [name], MD  Coenzyme Q10 (CO Q 10 PO) Take 1 capsule by mouth daily.     [provider]  fluticasone (FLONASE) 50 MCG/ACT nasal spray Place 1 spray into both nostrils daily. 04/01/20   Regalado, Belkys A, MD  IRON PO Take 1 tablet by mouth 2 (two) times a week.     [provider]  Javier Docker Oil 300 MG CAPS Take 300 mg by mouth daily.     [provider]  Melatonin 5 MG CHEW Chew 5 mg by mouth at  bedtime.     [provider]  Multiple Vitamins-Minerals (MULTIVITAMIN WITH MINERALS) tablet Take 1 tablet by mouth daily.    [provider]  pantoprazole (PROTONIX) 40 MG tablet Take 1 tablet (40 mg total) by mouth daily. 04/01/20   Regalado, Belkys A, MD  rosuvastatin (CRESTOR) 20 MG tablet Take 1 tablet (20 mg total) by mouth daily. Patient taking differently: Take 20 mg by mouth daily after supper.  12/25/19 03/31/20  Revankar, [name], MD    Allergies    Ciprofloxacin and Codeine  Review of Systems   Review of Systems  Constitutional: Positive for chills and fever.  HENT: Negative for congestion, rhinorrhea and sore throat.   Eyes: Negative for visual disturbance.  Respiratory: Positive for cough and shortness of breath.   Cardiovascular: Negative for chest pain, palpitations and leg swelling.  Gastrointestinal: Positive for abdominal pain, nausea and vomiting. Negative for constipation and diarrhea.  Genitourinary: Positive for dysuria and frequency. Negative for flank pain, vaginal bleeding and vaginal discharge.  Musculoskeletal: Negative for arthralgias and myalgias.  Skin: Negative for color change and rash.  Neurological: Negative for dizziness, syncope, light-headedness and headaches.    Physical Exam Updated Vital Signs BP (!) 121/54 (BP Location: Right Arm)   Pulse 84   Temp (!) 103.2 F (39.6 C) (Oral)   Resp (!) 26   SpO2 98%   Physical Exam Vitals and nursing note reviewed.  Constitutional:      Appearance: She is well-developed. She is ill-appearing. She is not diaphoretic.  HENT:     Head: Normocephalic and atraumatic.     Mouth/Throat:     Mouth: Mucous membranes are moist.     Pharynx: Oropharynx is clear.  Eyes:     General:        Right eye: No discharge.        Left eye: No discharge.     Pupils: Pupils are equal, round, and reactive to light.  Cardiovascular:     Rate and Rhythm: Normal rate and regular rhythm.     Heart  sounds: Normal heart sounds. No murmur heard.  No friction rub. No gallop.   Pulmonary:     Breath sounds: No wheezing or rales.     Comments: Patient is tachypneic with increased work of breathing, only able to speak a few words at a time, work of breathing improved significantly with 2 L nasal cannula, despite increased work of breathing patient has not been hypoxic, on exam patient with slightly diminished breath sounds in the bases, but lungs otherwise clear without rales, rhonchi or wheezing. Abdominal:     General: Bowel sounds are normal. There is no distension.     Palpations: Abdomen is soft. There is no mass.     Tenderness: There is abdominal tenderness. There is no guarding.     Comments: Abdomen is soft, nondistended, bowel sounds present throughout, there is some mild generalized tenderness that does not localize to any 1 quadrant, no guarding or peritoneal signs, no CVA tenderness bilaterally  Musculoskeletal:        General: No deformity.     Cervical back: Neck supple.     Right lower leg: No edema.     Left lower leg: No edema.  Skin:    General: Skin is warm and dry.     Capillary Refill: Capillary refill takes less than 2 seconds.  Neurological:     Mental Status: She is alert and oriented to person, place, and time.     Coordination: Coordination normal.     Comments: Speech is clear, able to follow commands Moves extremities without ataxia, coordination intact  Psychiatric:        Mood and Affect: Mood normal.        Behavior: Behavior normal.     ED Results / Procedures / Treatments   Labs (all labs ordered are listed, but only abnormal results are displayed) Labs Reviewed  LACTIC ACID, PLASMA - Abnormal; Notable for the following components:      Result Value   Lactic Acid, Venous 2.7 (*)    All other components within normal limits  COMPREHENSIVE METABOLIC PANEL - Abnormal; Notable for the following components:   Potassium 3.1 (*)  Glucose, Bld 131  (*)    Calcium 8.7 (*)    All other components within normal limits  CBC WITH DIFFERENTIAL/PLATELET - Abnormal; Notable for the following components:   Neutro Abs 9.7 (*)    Lymphs Abs 0.1 (*)    All other components within normal limits  CULTURE, BLOOD (ROUTINE X 2)  CULTURE, BLOOD (ROUTINE X 2)  URINE CULTURE  RESPIRATORY PANEL BY RT PCR (FLU A&B, COVID)  PROTIME-INR  APTT  LACTIC ACID, PLASMA  URINALYSIS, ROUTINE W REFLEX MICROSCOPIC    EKG EKG Interpretation  Date/Time:  Friday April 25 2020 10:50:29 EDT Ventricular Rate:  93 PR Interval:    QRS Duration: 93 QT Interval:  425 QTC Calculation: 529 R Axis:   74 Text Interpretation: Sinus rhythm Atrial premature complex Low voltage, precordial leads Borderline repolarization abnormality Prolonged QT interval No significant change since last tracing Confirmed by Calvert Cantor [phone]) on 04/25/2020 11:39:38 AM   Radiology DG Chest Port 1 View  Result Date: 04/25/2020 CLINICAL DATA:  Questionable sepsis. EXAM: PORTABLE CHEST 1 VIEW COMPARISON:  03/30/2020 FINDINGS: Cardiomediastinal contours and hilar structures are normal. Lungs are clear. No gross sign of effusion.  Leads project over the chest. On limited assessment no acute skeletal process. IMPRESSION: No acute cardiopulmonary process. Electronically Signed   By: Zetta Bills M.D.   On: 04/25/2020 11:46    Procedures .Critical Care Performed by: Jacqlyn Larsen, PA-C Authorized by: Jacqlyn Larsen, PA-C   Critical care provider statement:    Critical care time (minutes):  45   Critical care was necessary to treat or prevent imminent or life-threatening deterioration of the following conditions:  Sepsis   Critical care was time spent personally by me on the following activities:  Discussions with consultants, evaluation of patient's response to treatment, examination of patient, ordering and performing treatments and interventions, ordering and review of laboratory  studies, ordering and review of radiographic studies, pulse oximetry, re-evaluation of patient's condition, obtaining history from patient or surrogate and review of old charts   (including critical care time)  Medications Ordered in ED Medications  ceFEPIme (MAXIPIME) 2 g in sodium chloride 0.9 % 100 mL IVPB (2 g Intravenous New Bag/Given 04/25/20 1142)  metroNIDAZOLE (FLAGYL) IVPB 500 mg (500 mg Intravenous New Bag/Given 04/25/20 1137)  vancomycin (VANCOCIN) IVPB 1000 mg/200 mL premix (has no administration in time range)  lactated ringers bolus 1,000 mL (1,000 mLs Intravenous New Bag/Given 04/25/20 1139)  vancomycin (VANCOCIN) IVPB 1000 mg/200 mL premix (has no administration in time range)  sodium chloride 0.9 % with ceFEPIme (MAXIPIME) ADS Med (has no administration in time range)  ondansetron (ZOFRAN) injection 4 mg (4 mg Intravenous Given 04/25/20 1138)  acetaminophen (TYLENOL) tablet 1,000 mg (1,000 mg Oral Given 04/25/20 1138)    ED Course  I have reviewed the triage vital signs and the nursing notes.  Pertinent labs & imaging results that were available during my care of the patient were reviewed by me and considered in my medical decision making (see chart for details).    MDM Rules/Calculators/A&P                          69 year old female arrives febrile and ill-appearing with tachypnea and increased work of breathing.  States that symptoms began last night when she started having pain at the urethra, dysuria and urinary frequency.  She states that she that she then developed some shortness of breath and  a nonproductive cough.  She states her lungs feel "irritated".  She had a similar presentation 3 weeks ago and was admitted for sepsis likely due to pyelonephritis.  She has not been on any antibiotics since completing her antibiotics after that admission.  Reports some nausea, vomiting and generalized abdominal pain, but no flank pain and no CVA tenderness noted on exam.  Lungs  slightly diminished in the bases, patient's respiratory effort improved on 2 L nasal cannula, no wheezes, rales or rhonchi.  During recent hospital admission she presented with similar shortness of breath, extensive work-up, including CTA which were negative.  Code sepsis initiated and will get sepsis labs, urinalysis, culture and chest x-ray.  We will also get Covid swab.  Patient started on broad-spectrum antibiotics for unknown source, especially given that she is recently on antibiotics during her last admission.  Will give 1 L of fluids, but patient is not hypotensive, and is alert and mentating well, so we will hold off on 30 cc/kg bolus at this time pending lactic acid. Tylenol given for fever.  Chest x-ray reviewed by myself, radiologist reports no active cardiopulmonary disease, it does look like there is some haziness over the costophrenic angles which may be patient's norm versus occult basilar atelectasis or pneumonia.  Labs show no leukocytosis, normal hemoglobin, CMP shows potassium of 3.1 but no other significant electrolyte derangements, normal renal and liver function.  Initial lactic acid of 2.7.  Covid and flu test negative.  Awaiting UA to assess for source of infection, if this does not show obvious infection may need CT scans to evaluate for occult sources.  Fever resolved with Tylenol and work of breathing significantly improved with treatment with fluids, Tylenol and antibiotics.  Urinalysis positive for nitrites with leukocytes present, WBCs and white blood cell clumps as well as bacteria present consistent with infection.  Suspect this is the source of patient's sepsis.  Thus far she has remained stable with improving lactic after 1 L fluid bolus and antibiotics.  Patient did have one systolic blood pressure documented in the 90s but on repeat this was improved to 109, after cuff was repositioned.  Continues to report improvement in her symptoms.  Initial lactic was not  significantly elevated we will continue to monitor blood pressures closely, but given patient's history of heart failure we will hold off on additional fluids at this time do not want to cause any pulmonary edema that would lead to worsening respiratory symptoms.  3:00 PM Continuing to monitor patient's blood pressure,  stable currently at 124/63.  Case discussed with Dr. Cyndia Skeeters with [facility] hospitalist who accepts the patient for transfer and admission for continued treatment for sepsis from UTI.  Final Clinical Impression(s) / ED Diagnoses Final diagnoses:  Sepsis secondary to UTI ([place])  SOB (shortness of breath)    Rx / DC Orders ED Discharge Orders    None       Janet Berlin 04/25/20 1759    Truddie Hidden, MD 04/26/20 [phone]

## 2020-04-26 LAB — CBC
HCT: 37.3 % (ref 36.0–46.0)
Hemoglobin: 11.7 g/dL — ABNORMAL LOW (ref 12.0–15.0)
MCH: 29.5 pg (ref 26.0–34.0)
MCHC: 31.4 g/dL (ref 30.0–36.0)
MCV: 94 fL (ref 80.0–100.0)
Platelets: 148 10*3/uL — ABNORMAL LOW (ref 150–400)
RBC: 3.97 MIL/uL (ref 3.87–5.11)
RDW: 14.1 % (ref 11.5–15.5)
WBC: 10.5 10*3/uL (ref 4.0–10.5)
nRBC: 0 % (ref 0.0–0.2)

## 2020-04-26 LAB — BRAIN NATRIURETIC PEPTIDE: B Natriuretic Peptide: 124.2 pg/mL — ABNORMAL HIGH (ref 0.0–100.0)

## 2020-04-26 LAB — BASIC METABOLIC PANEL
Anion gap: 6 (ref 5–15)
BUN: 19 mg/dL (ref 8–23)
CO2: 23 mmol/L (ref 22–32)
Calcium: 7.7 mg/dL — ABNORMAL LOW (ref 8.9–10.3)
Chloride: 106 mmol/L (ref 98–111)
Creatinine, Ser: 0.73 mg/dL (ref 0.44–1.00)
GFR, Estimated: >60 mL/min (ref >60)
Glucose, Bld: 108 mg/dL — ABNORMAL HIGH (ref 70–99)
Potassium: 3.7 mmol/L (ref 3.5–5.1)
Sodium: 135 mmol/L (ref 135–145)

## 2020-04-26 LAB — HIV ANTIBODY (ROUTINE TESTING W REFLEX): HIV Screen 4th Generation wRfx: NONREACTIVE

## 2020-04-26 LAB — LACTIC ACID, PLASMA
Lactic Acid, Venous: 1.2 mmol/L (ref 0.5–1.9)
Lactic Acid, Venous: 1.5 mmol/L (ref 0.5–1.9)

## 2020-04-26 MED ORDER — BUDESONIDE 0.5 MG/2ML IN SUSP: 0.5000 mg | Freq: Two times a day (BID) | RESPIRATORY_TRACT | Status: DC

## 2020-04-26 MED ORDER — ROSUVASTATIN CALCIUM 20 MG PO TABS
20.0000 mg | ORAL_TABLET | Freq: Every day | ORAL | Status: DC
  Administered 2020-04-26 – 2020-04-28 (×3): 20 mg via ORAL
  Filled 2020-04-26 (×3): qty 1

## 2020-04-26 MED ORDER — ADULT MULTIVITAMIN W/MINERALS CH
1.0000 | ORAL_TABLET | Freq: Every day | ORAL | Status: DC
  Administered 2020-04-29: 1 via ORAL
  Filled 2020-04-26 (×3): qty 1

## 2020-04-26 MED ORDER — BUDESONIDE 0.5 MG/2ML IN SUSP
0.5000 mg | Freq: Two times a day (BID) | RESPIRATORY_TRACT | Status: DC
  Administered 2020-04-26 – 2020-04-29 (×5): 0.5 mg via RESPIRATORY_TRACT
  Filled 2020-04-26 (×6): qty 2

## 2020-04-26 MED ORDER — CLOPIDOGREL BISULFATE 75 MG PO TABS
75.0000 mg | ORAL_TABLET | Freq: Every day | ORAL | Status: DC
  Administered 2020-04-26 – 2020-04-28 (×3): 75 mg via ORAL
  Filled 2020-04-26 (×3): qty 1

## 2020-04-26 MED ORDER — UMECLIDINIUM-VILANTEROL 62.5-25 MCG/INH IN AEPB
1.0000 | INHALATION_SPRAY | Freq: Every day | RESPIRATORY_TRACT | Status: DC
  Administered 2020-04-27 – 2020-04-29 (×3): 1 via RESPIRATORY_TRACT
  Filled 2020-04-26: qty 14

## 2020-04-26 MED ORDER — MULTI-VITAMIN/MINERALS PO TABS: 1.0000 | ORAL_TABLET | Freq: Every day | ORAL | Status: DC

## 2020-04-26 MED ORDER — UMECLIDINIUM-VILANTEROL 62.5-25 MCG/INH IN AEPB
1.0000 | INHALATION_SPRAY | Freq: Every day | RESPIRATORY_TRACT | Status: DC
  Filled 2020-04-26: qty 14

## 2020-04-26 MED ORDER — IPRATROPIUM-ALBUTEROL 0.5-2.5 (3) MG/3ML IN SOLN: 3.0000 mL | Freq: Four times a day (QID) | RESPIRATORY_TRACT | Status: DC | PRN

## 2020-04-26 MED ORDER — LISINOPRIL 20 MG PO TABS
20.0000 mg | ORAL_TABLET | Freq: Every day | ORAL | Status: DC
  Administered 2020-04-26: 20 mg via ORAL
  Filled 2020-04-26: qty 1

## 2020-04-26 MED ORDER — SODIUM CHLORIDE 0.9 % IV SOLN
2.0000 g | INTRAVENOUS | Status: DC
  Administered 2020-04-26 – 2020-04-28 (×3): 2 g via INTRAVENOUS
  Filled 2020-04-26: qty 20
  Filled 2020-04-26 (×3): qty 2

## 2020-04-26 MED ORDER — GUAIFENESIN 100 MG/5ML PO SOLN
5.0000 mL | ORAL | Status: DC | PRN
  Administered 2020-04-26: 100 mg via ORAL
  Filled 2020-04-26: qty 10

## 2020-04-26 MED ORDER — PANTOPRAZOLE SODIUM 40 MG PO TBEC
40.0000 mg | DELAYED_RELEASE_TABLET | Freq: Every day | ORAL | Status: DC
  Administered 2020-04-26 – 2020-04-29 (×4): 40 mg via ORAL
  Filled 2020-04-26 (×4): qty 1

## 2020-04-26 MED ORDER — FLUTICASONE PROPIONATE 50 MCG/ACT NA SUSP
1.0000 | Freq: Every day | NASAL | Status: DC
  Administered 2020-04-26 – 2020-04-29 (×4): 1 via NASAL
  Filled 2020-04-26: qty 16

## 2020-04-26 NOTE — H&P (Signed)
History and Physical    Brittany Middleton TDD:[id number] DOB: 05/04/1951 DOA: 04/25/2020  PCP: Maurice Small, MD (Confirm with patient/family/NH records and if not entered, this has to be entered at [facility] point of entry) Patient coming from: Transferred from [facility] emergency department  I have personally briefly reviewed patient's old medical records in [place]  Chief Complaint: Fever with dysuria and urinary frequency  HPI: Brittany Middleton is a 69 y.o. female with medical history significant of hypertension, hyperlipidemia, obesity, ascending aortic aneurysm and GERD who presented to [place] ED for evaluation of fever with chills and dysuria.  Patient states that he is having urinary urgency, frequency with dysuria for the last 2 to 3 days that is getting worse.  Patient also reported severe generalized weakness, tiredness and episodic high-grade fever.  Patient was also complaining of suprapubic tenderness and multiple episode of vomiting.  Patient further mentioned that she recently had an episode of UTI and was admitted to the hospital.  (For level 3, the HPI must include 4+ descriptors: Location, Quality, Severity, Duration, Timing, Context, modifying factors, associated signs/symptoms and/or status of 3+ chronic problems.)  (Please avoid self-populating past medical history here) (The initial 2-3 lines should be focused and good to copy and paste in the HPI section of the daily progress note).  ED Course: On arrival to [place] ED patient had blood pressure of 121/54, heart rate 84, temperature 103.2, respiratory rate 26 and oxygen saturation 98% but patient was started on a liter of oxygen with nasal cannula for comfort.  Patient was meeting the sepsis criteria secondary to UTI and was given 1 L of IV fluid and broad-spectrum antibiotics with IV vancomycin, IV cefepime and metronidazole at [facility] ED.  It is transferred to [facility] for further management.  Review of Systems: As per HPI otherwise 10 point review of systems negative.  Unacceptable ROS statements: "10 systems reviewed," "Extensive" (without elaboration).  Acceptable ROS statements: "All others negative," "All others reviewed and are negative," and "All others unremarkable," with at [place] documented Can't double dip - if using for HPI can't use for ROS  Past Medical History:  Diagnosis Date  . Acute blood loss anemia 07/17/2018  . Anemia 07/17/2018  . Ascending aortic aneurysm ([place]) 05/23/2019  . Cervical disc disease    MRI 2016  . Chest pain with moderate risk for cardiac etiology 02/27/2018  . 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 Gastrointestinal bleeding 07/25/2018  . Gastrointestinal hemorrhage with melena 07/16/2018  . Hepatic steatosis    per imaging 2016, 2019  . Hiatal hernia   . History of palpitations    Holter monitor, 2017: NSR, occasional PVC, PACs, no arrhythmia  . HLD (hyperlipidemia) 02/27/2018  . HTN (hypertension) 02/27/2018  . Hypercholesteremia   . Hypercholesterolemia 04/07/2017  . Hypertension   . Hypertensive disorder 04/07/2017  . Hypokalemia 07/17/2018  . Lumbar disc disease    MRI, 2017   . Morbid obesity ([place]) 05/22/2018  . Overweight 03/03/2018  . Palpitations 08/19/2015  . Pyelonephritis 03/30/2020  . UTI (urinary tract infection)     Past Surgical History:  Procedure Laterality Date  . BLADDER REPAIR    . BREAST EXCISIONAL BIOPSY Left   . CARPAL TUNNEL RELEASE Right   . CATARACT EXTRACTION, BILATERAL  2014  . COLONOSCOPY WITH PROPOFOL N/A 07/18/2018   Procedure: COLONOSCOPY WITH PROPOFOL;  Surgeon: Laurence Spates, MD;  Location: [facility];  Service: Endoscopy;  Laterality: N/A;  . ESOPHAGOGASTRODUODENOSCOPY (EGD) WITH PROPOFOL N/A 07/17/2018   Procedure: ESOPHAGOGASTRODUODENOSCOPY (EGD) WITH PROPOFOL;  Surgeon: Laurence Spates, MD;  Location: [facility];  Service: Endoscopy;  Laterality: N/A;  . REPAIR  RECTOCELE       reports that she has never smoked. She has never used smokeless tobacco. She reports that she does not drink alcohol and does not use drugs.  Allergies  Allergen Reactions  . Ciprofloxacin Other (See Comments)    Caused PAIN IN HANDS   . Codeine Nausea And Vomiting    Family History  Problem Relation Age of Onset  . CAD Mother   . Breast cancer Mother 67    Unacceptable: Noncontributory, unremarkable, or negative. Acceptable: (example)Family history negative for heart disease  Prior to Admission medications   Medication Sig Start Date End Date Taking? Authorizing Provider  acetaminophen (TYLENOL) 500 MG tablet Take 1,000 mg by mouth every 6 (six) hours as needed for mild pain, moderate pain, fever or headache.   Yes [provider]  albuterol (PROVENTIL) (2.5 MG/3ML) 0.083% nebulizer solution Take 2.5 mg by nebulization every 6 (six) hours as needed for wheezing or shortness of breath.   Yes [provider]  albuterol (VENTOLIN HFA) 108 (90 Base) MCG/ACT inhaler Inhale 2 puffs into the lungs every 6 (six) hours as needed for wheezing or shortness of breath. 04/01/20  Yes Regalado, Belkys A, MD  BIOTIN PO Take 1 tablet by mouth daily.   Yes [provider]  Cholecalciferol (VITAMIN D-3) 25 MCG (1000 UT) CAPS Take 1,000 Units by mouth daily.   Yes [provider]  clopidogrel (PLAVIX) 75 MG tablet Take 1 tablet (75 mg total) by mouth daily. Patient taking differently: Take 75 mg by mouth daily with supper.  06/28/19  Yes Revankar, [name], MD  Coenzyme Q10-Vitamin E (QUNOL ULTRA COQ10 PO) Take 1 capsule by mouth daily.   Yes [provider]  fluticasone (FLONASE) 50 MCG/ACT nasal spray Place 1 spray into both nostrils daily. Patient taking differently: Place 1 spray into both nostrils daily as needed for allergies or rhinitis.  04/01/20  Yes Regalado, Belkys A, MD  hydrocortisone 2.5 % cream Apply 1 application topically daily.  04/13/20  Yes [provider]  IRON PO Take 1 tablet by mouth 2 (two) times a week.    Yes [provider]  lisinopril-hydrochlorothiazide (ZESTORETIC) 20-25 MG tablet Take 1 tablet by mouth daily.   Yes [provider]  Melatonin 5 MG CHEW Chew 5 mg by mouth at bedtime.    Yes [provider]  Multiple Vitamins-Minerals (MULTIVITAMIN WITH MINERALS) tablet Take 1 tablet by mouth daily.   Yes [provider]  pantoprazole (PROTONIX) 40 MG tablet Take 1 tablet (40 mg total) by mouth daily. 04/01/20  Yes Regalado, Belkys A, MD  PRESCRIPTION MEDICATION Inhale into the lungs at bedtime. CPAP   Yes [provider]  rosuvastatin (CRESTOR) 20 MG tablet Take 1 tablet (20 mg total) by mouth daily. Patient taking differently: Take 20 mg by mouth daily after supper.  12/25/19 05/20/20 Yes Revankar, [name], MD  TURMERIC PO Take 1 capsule by mouth daily.   Yes [provider]    Physical Exam: Vitals:   04/25/20 2228 04/25/20 2333 04/26/20 0100 04/26/20 0319  BP: (!) 136/58 (!) 133/58  (!) 120/53  Pulse: 80 81  78  Resp: (!) 22 20  18   Temp: 99.3 F (37.4 C) 98.8 F (37.1  C) 99.9 F (37.7 C) 98.8 F (37.1 C)  TempSrc: Oral Oral Oral Oral  SpO2: 91% 93%  95%  Weight:      Height:        Constitutional: NAD, calm, comfortable Vitals:   04/25/20 2228 04/25/20 2333 04/26/20 0100 04/26/20 0319  BP: (!) 136/58 (!) 133/58  (!) 120/53  Pulse: 80 81  78  Resp: (!) 22 20  18   Temp: 99.3 F (37.4 C) 98.8 F (37.1 C) 99.9 F (37.7 C) 98.8 F (37.1 C)  TempSrc: Oral Oral Oral Oral  SpO2: 91% 93%  95%  Weight:      Height:        General: 69 year old obese Caucasian female who looks sick and tired. Eyes: PERRL, lids and conjunctivae normal ENMT: Mucous membranes are moist. Posterior pharynx clear of any exudate or lesions.Normal dentition.  Neck: normal, supple, no masses, no thyromegaly Respiratory: clear to auscultation  bilaterally, no wheezing, no crackles. Normal respiratory effort. No accessory muscle use.  Cardiovascular: Regular rate and rhythm, no murmurs / rubs / gallops. No extremity edema. 2+ pedal pulses. No carotid bruits.  Abdomen: Mild tenderness in suprapubic region, no masses palpated. No hepatosplenomegaly. Bowel sounds positive.  Musculoskeletal: no clubbing / cyanosis. No joint deformity upper and lower extremities. Good ROM, no contractures. Normal muscle tone.  Skin: no rashes, lesions, ulcers. No induration Neurologic: CN 2-12 grossly intact. Sensation intact, DTR normal. Strength 5/5 in all 4.  Psychiatric: Normal judgment and insight. Alert and oriented x 3. Normal mood.   (Anything < 9 systems with 2 bullets each down codes to level 1) (If patient refuses exam can't bill higher level) (Make sure to document decubitus ulcers present on admission -- if possible -- and whether patient has chronic indwelling catheter at time of admission)  Labs on Admission: I have personally reviewed following labs and imaging studies  CBC: Recent Labs  Lab 04/25/20 1124  WBC 10.3  NEUTROABS 9.7*  HGB 13.5  HCT 40.8  MCV 90.9  PLT 811   Basic Metabolic Panel: Recent Labs  Lab 04/25/20 1124  NA 138  K 3.1*  CL 101  CO2 25  GLUCOSE 131*  BUN 13  CREATININE 0.81  CALCIUM 8.7*   GFR: Estimated Creatinine Clearance: 87.6 mL/min (by C-G formula based on SCr of 0.81 mg/dL). Liver Function Tests: Recent Labs  Lab 04/25/20 1124  AST 27  ALT 18  ALKPHOS 45  BILITOT 1.2  PROT 6.8  ALBUMIN 3.9   No results for input(s): LIPASE, AMYLASE in the last 168 hours. No results for input(s): AMMONIA in the last 168 hours. Coagulation Profile: Recent Labs  Lab 04/25/20 1124  INR 1.0   Cardiac Enzymes: No results for input(s): CKTOTAL, CKMB, CKMBINDEX, TROPONINI in the last 168 hours. BNP (last 3 results) No results for input(s): PROBNP in the last 8760 hours. HbA1C: No results for  input(s): HGBA1C in the last 72 hours. CBG: No results for input(s): GLUCAP in the last 168 hours. Lipid Profile: No results for input(s): CHOL, HDL, LDLCALC, TRIG, CHOLHDL, LDLDIRECT in the last 72 hours. Thyroid Function Tests: No results for input(s): TSH, T4TOTAL, FREET4, T3FREE, THYROIDAB in the last 72 hours. Anemia Panel: No results for input(s): VITAMINB12, FOLATE, FERRITIN, TIBC, IRON, RETICCTPCT in the last 72 hours. Urine analysis:    Component Value Date/Time   COLORURINE ORANGE (A) 04/25/2020 1332   APPEARANCEUR CLEAR 04/25/2020 1332   LABSPEC 1.010 04/25/2020 1332   PHURINE 5.5 04/25/2020  1332   GLUCOSEU 100 (A) 04/25/2020 1332   HGBUR SMALL (A) 04/25/2020 1332   BILIRUBINUR NEGATIVE 04/25/2020 1332   KETONESUR NEGATIVE 04/25/2020 1332   PROTEINUR NEGATIVE 04/25/2020 1332   UROBILINOGEN 1.0 10/31/2014 1646   NITRITE POSITIVE (A) 04/25/2020 1332   LEUKOCYTESUR SMALL (A) 04/25/2020 1332    Radiological Exams on Admission: DG Chest Port 1 View  Result Date: 04/25/2020 CLINICAL DATA:  Questionable sepsis. EXAM: PORTABLE CHEST 1 VIEW COMPARISON:  03/30/2020 FINDINGS: Cardiomediastinal contours and hilar structures are normal. Lungs are clear. No gross sign of effusion.  Leads project over the chest. On limited assessment no acute skeletal process. IMPRESSION: No acute cardiopulmonary process. Electronically Signed   By: Zetta Bills M.D.   On: 04/25/2020 11:46      Assessment/Plan Principal Problem:   Sepsis secondary to UTI [facility]) Patient was meeting the sepsis criteria and 2 more liters of IV fluid given according to the sepsis protocol.  Continue IV vancomycin and IV cefepime.  Blood and urine cultures are already ordered and pending.  Tylenol as needed for mild pain and fever.  Active Problems: Hypokalemia Potassium repletion ordered.  Continue to monitor BMP in the morning.     HTN (hypertension) Blood pressure stable.  Patient will be started on home  medications after confirmation of medications list.    Hypercholesterolemia Restart home medications after medication list is confirmed    Morbid obesity ([place]) Encouraged about healthy lifestyle    (please populate well all problems here in Problem List. (For example, if patient is on BP meds at home and you resume or decide to hold them, it is a problem that needs to be her. Same for CAD, COPD, HLD and so on)    DVT prophylaxis: Lovenox Code Status: Full code Family Communication: No family member at bedside Disposition Plan:  Consults called:  Admission status: Telemetry/inpatient   Edmonia Lynch MD [facility] Pager [phone]   If 7PM-7AM, please contact night-coverage [email or web address] Password [email or web address]  04/26/2020, 4:01 AM

## 2020-04-26 NOTE — Plan of Care (Signed)

## 2020-04-26 NOTE — Progress Notes (Signed)
PROGRESS NOTE  Brittany Middleton QQP:[id number] DOB: [birth year]   PCP: Maurice Small, MD  Patient is from: Home.  DOA: 04/25/2020 LOS: 1  Chief complaints:   Brief Narrative / Interim history: 69 year old female with history of HTN, HLD, obesity, RCA aneurysm, recent UTI and GERD presenting with UTI symptoms including dysuria, urgency, frequency, generalized weakness and episodic fever for 2 to 3 days.  She also has some dyspnea and dry cough.    Patient was hospitalized about 3 weeks ago for clinical pyelonephritis and hypoxic respiratory failure with diffuse wheezing and treated with IV ceftriaxone for 3 days and discharged on PPI, albuterol inhalers and Flonase for respiratory symptoms.  Urine culture was negative at that time.  She has upcoming appointment with pulmonology on 11/11 for PFT.  Of note, patient did not use her albuterol.   In ED, In ED, febrile to 103.2. RR 18> 26> 22.  BP and saturation within normal.  WBC 10.3 (higher than baseline).  LA 2.7> 2.2.  UA consistent with UTI.  CXR without significant finding.  Received a liter of LR bolus.  Cultures sent. Started on cefepime, Flagyl and vancomycin.  Admitted for severe sepsis due to UTI.  COVID-19 PCR negative.  She was continued on cefepime and vancomycin on admission.  Subjective: Seen and examined earlier this morning.  No major events overnight of this morning.  Reports feeling better.  Still with some shortness of breath and dry cough.  UTI symptoms improved.  Denies abdominal pain but some tenderness over suprapubic area.  Denies any back pain.  Denies nausea or vomiting.  Objective: Vitals:   04/25/20 2333 04/26/20 0100 04/26/20 0319 04/26/20 0650  BP: (!) 133/58  (!) 120/53 (!) 134/56  Pulse: 81  78 74  Resp: 20  18 (!) 22  Temp: 98.8 F (37.1 C) 99.9 F (37.7 C) 98.8 F (37.1 C) 98.7 F (37.1 C)  TempSrc: Oral Oral Oral Oral  SpO2: 93%  95% 97%  Weight:      Height:        Intake/Output Summary (Last 24  hours) at 04/26/2020 1100 Last data filed at 04/26/2020 0703 Gross per 24 hour  Intake 400 ml  Output 400 ml  Net 0 ml   Filed Weights   04/25/20 1517  Weight: 120.2 kg    Examination:  GENERAL: No apparent distress.  Nontoxic. HEENT: MMM.  Vision and hearing grossly intact.  NECK: Supple.  No apparent JVD.  RESP: 95% on 2 L.  Some increased work of breathing.  Diminished aeration CVS:  RRR. Heart sounds normal.  ABD/GI/GU: BS+. Abd soft.  Suprapubic tenderness.  No CVA tenderness. MSK/EXT:  Moves extremities. No apparent deformity. No edema.  SKIN: no apparent skin lesion or wound NEURO: Awake, alert and oriented appropriately.  No apparent focal neuro deficit. PSYCH: Calm. Normal affect.   Procedures:  None  Microbiology summarized: RVP negative. Blood cultures NGTD. Urine culture pending.  Assessment & Plan: Severe sepsis due to urinary tract infection: POA.  Met criteria with fever, tachypnea and lactic acidosis.  She has clinical and laboratory evidence of UTI.  Sepsis physiology resolving.  Blood cultures NGTD.  No history of ESBL or MDR.  -De-escalate antibiotic to IV ceftriaxone -Follow urine cultures  Dyspnea/cough: Concern for COPD exacerbation although no formal diagnosis.  Secondhand exposure to smoking.  She has cough, some increased work of breathing and diminished aeration on exam.  Appears euvolemic.  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 -  Start DuoNeb and budesonide -If no improvement with above, will add systemic steroid -Has upcoming appointment for PFT on 05/01/2020. -Continue PPI and Flonase -Check BNP  Nonobstructive CAD/RCA aneurysm: No chest pain -Continue statin and Plavix  Hypokalemia: Likely due to HCTZ.  Resolved. -Hold HCTZ  Essential hypertension: On lisinopril and HCTZ at home.  BP within fair range. -Resume home lisinopril  Hyperlipidemia -Continue home Crestor  GERD/hiatal hernia -Continue PPI.  Morbid obesity Body mass index is  44.1 kg/m.  -Encourage lifestyle change to lose weight.       DVT prophylaxis:  On subcu Lovenox  Code Status: Full code Family Communication: Patient and/or RN. Available if any question.  Status is: Inpatient  Remains inpatient appropriate because:IV treatments appropriate due to intensity of illness or inability to take PO and Inpatient level of care appropriate due to severity of illness   Dispo: The patient is from: Home              Anticipated d/c is to: Home              Anticipated d/c date is: 2 days              Patient currently is not medically stable to d/c.       Consultants:  None   Sch Meds:  Scheduled Meds: . budesonide (PULMICORT) nebulizer solution  0.5 mg Nebulization BID  . clopidogrel  75 mg Oral Q supper  . enoxaparin (LOVENOX) injection  60 mg Subcutaneous Q24H  . fluticasone  1 spray Each Nare Daily  . lisinopril  20 mg Oral Daily  . multivitamin with minerals  1 tablet Oral Daily  . pantoprazole  40 mg Oral Daily  . rosuvastatin  20 mg Oral QPC supper   Continuous Infusions: . ceFEPime (MAXIPIME) IV 2 g (04/25/20 2334)  . vancomycin 1,000 mg (04/26/20 0148)   PRN Meds:.acetaminophen **OR** acetaminophen, ipratropium-albuterol, ondansetron **OR** ondansetron (ZOFRAN) IV  Antimicrobials: Anti-infectives (From admission, onward)   Start     Dose/Rate Route Frequency Ordered Stop   04/26/20 0100  vancomycin (VANCOCIN) IVPB 1000 mg/200 mL premix        1,000 mg 200 mL/hr over 60 Minutes Intravenous Every 12 hours 04/25/20 1222     04/25/20 2000  ceFEPIme (MAXIPIME) 2 g in sodium chloride 0.9 % 100 mL IVPB        2 g 200 mL/hr over 30 Minutes Intravenous Every 8 hours 04/25/20 1222     04/25/20 1215  vancomycin (VANCOCIN) IVPB 1000 mg/200 mL premix        1,000 mg 200 mL/hr over 60 Minutes Intravenous  Once 04/25/20 1111 04/25/20 1400   04/25/20 1136  sodium chloride 0.9 % with ceFEPIme (MAXIPIME) ADS Med       Note to Pharmacy:  Danie Binder : cabinet override      04/25/20 1136 04/25/20 2344   04/25/20 1115  ceFEPIme (MAXIPIME) 2 g in sodium chloride 0.9 % 100 mL IVPB        2 g 200 mL/hr over 30 Minutes Intravenous  Once 04/25/20 1105 04/25/20 1234   04/25/20 1115  metroNIDAZOLE (FLAGYL) IVPB 500 mg        500 mg 100 mL/hr over 60 Minutes Intravenous  Once 04/25/20 1105 04/25/20 1234   04/25/20 1115  vancomycin (VANCOCIN) IVPB 1000 mg/200 mL premix        1,000 mg 200 mL/hr over 60 Minutes Intravenous  Once 04/25/20 1105 04/25/20 1520  I have personally reviewed the following labs and images: CBC: Recent Labs  Lab 04/25/20 1124 04/26/20 0532  WBC 10.3 10.5  NEUTROABS 9.7*  --   HGB 13.5 11.7*  HCT 40.8 37.3  MCV 90.9 94.0  PLT 179 148*   BMP &GFR Recent Labs  Lab 04/25/20 1124 04/26/20 0532  NA 138 135  K 3.1* 3.7  CL 101 106  CO2 25 23  GLUCOSE 131* 108*  BUN 13 19  CREATININE 0.81 0.73  CALCIUM 8.7* 7.7*   Estimated Creatinine Clearance: 88.7 mL/min (by C-G formula based on SCr of 0.73 mg/dL). Liver & Pancreas: Recent Labs  Lab 04/25/20 1124  AST 27  ALT 18  ALKPHOS 45  BILITOT 1.2  PROT 6.8  ALBUMIN 3.9   No results for input(s): LIPASE, AMYLASE in the last 168 hours. No results for input(s): AMMONIA in the last 168 hours. Diabetic: No results for input(s): HGBA1C in the last 72 hours. No results for input(s): GLUCAP in the last 168 hours. Cardiac Enzymes: No results for input(s): CKTOTAL, CKMB, CKMBINDEX, TROPONINI in the last 168 hours. No results for input(s): PROBNP in the last 8760 hours. Coagulation Profile: Recent Labs  Lab 04/25/20 1124  INR 1.0   Thyroid Function Tests: No results for input(s): TSH, T4TOTAL, FREET4, T3FREE, THYROIDAB in the last 72 hours. Lipid Profile: No results for input(s): CHOL, HDL, LDLCALC, TRIG, CHOLHDL, LDLDIRECT in the last 72 hours. Anemia Panel: No results for input(s): VITAMINB12, FOLATE, FERRITIN, TIBC, IRON,  RETICCTPCT in the last 72 hours. Urine analysis:    Component Value Date/Time   COLORURINE ORANGE (A) 04/25/2020 1332   APPEARANCEUR CLEAR 04/25/2020 1332   LABSPEC 1.010 04/25/2020 1332   PHURINE 5.5 04/25/2020 1332   GLUCOSEU 100 (A) 04/25/2020 1332   HGBUR SMALL (A) 04/25/2020 1332   BILIRUBINUR NEGATIVE 04/25/2020 1332   KETONESUR NEGATIVE 04/25/2020 1332   PROTEINUR NEGATIVE 04/25/2020 1332   UROBILINOGEN 1.0 10/31/2014 1646   NITRITE POSITIVE (A) 04/25/2020 1332   LEUKOCYTESUR SMALL (A) 04/25/2020 1332   Sepsis Labs: Invalid input(s): PROCALCITONIN, [facility]  Microbiology: Recent Results (from the past 240 hour(s))  Blood Culture (routine x 2)     Status: None (Preliminary result)   Collection Time: 04/25/20 11:18 AM   Specimen: BLOOD LEFT ARM  Result Value Ref Range Status   Specimen Description   Final    BLOOD LEFT ARM Performed at [facility], [address] [place], [place] [address]    Special Requests   Final    BOTTLES DRAWN AEROBIC AND ANAEROBIC Blood Culture adequate volume Performed at [facility], [address] [place], [place] [address]    Culture   Final    NO GROWTH < 24 HOURS Performed at [facility], [place] [address], [place], [place] [address]    Report Status PENDING  Incomplete  Blood Culture (routine x 2)     Status: None (Preliminary result)   Collection Time: 04/25/20 11:22 AM   Specimen: BLOOD RIGHT ARM  Result Value Ref Range Status   Specimen Description   Final    BLOOD RIGHT ARM Performed at [facility], [address] [place], [place] [address]    Special Requests   Final    BOTTLES DRAWN AEROBIC AND ANAEROBIC Blood Culture adequate volume Performed at [facility], [address], [place], [place] [address]    Culture   Final  NO GROWTH < 24 HOURS Performed at [facility] [address], [place], [place] [address]    Report Status PENDING  Incomplete   Respiratory Panel by RT PCR (Flu A&B, Covid) - Peripheral     Status: None   Collection Time: 04/25/20 11:24 AM   Specimen: Peripheral; Nasopharyngeal  Result Value Ref Range Status   SARS Coronavirus 2 by RT PCR NEGATIVE NEGATIVE Final    Comment: (NOTE) SARS-CoV-2 target nucleic acids are NOT DETECTED.  The SARS-CoV-2 RNA is generally detectable in upper respiratoy specimens during the acute phase of infection. The lowest concentration of SARS-CoV-2 viral copies this assay can detect is 131 copies/mL. A negative result does not preclude SARS-Cov-2 infection and should not be used as the sole basis for treatment or other patient management decisions. A negative result may occur with  improper specimen collection/handling, submission of specimen other than nasopharyngeal swab, presence of viral mutation(s) within the areas targeted by this assay, and inadequate number of viral copies (<131 copies/mL). A negative result must be combined with clinical observations, patient history, and epidemiological information. The expected result is Negative.  Fact Sheet for Patients:  PinkCheek.be  Fact Sheet for Healthcare Providers:  GravelBags.it  This test is no t yet approved or cleared by the [place] FDA and  has been authorized for detection and/or diagnosis of SARS-CoV-2 by FDA under an Emergency Use Authorization (EUA). This EUA will remain  in effect (meaning this test can be used) for the duration of the COVID-19 declaration under Section 564(b)(1) of the Act, 21 U.S.C. section 360bbb-3(b)(1), unless the authorization is terminated or revoked sooner.     Influenza A by PCR NEGATIVE NEGATIVE Final   Influenza B by PCR NEGATIVE NEGATIVE Final    Comment: (NOTE) The Xpert Xpress SARS-CoV-2/FLU/RSV assay is intended as an aid in  the diagnosis of influenza from Nasopharyngeal swab specimens and  should not be used as a  sole basis for treatment. Nasal washings and  aspirates are unacceptable for Xpert Xpress SARS-CoV-2/FLU/RSV  testing.  Fact Sheet for Patients: PinkCheek.be  Fact Sheet for Healthcare Providers: GravelBags.it  This test is not yet approved or cleared by the [place] FDA and  has been authorized for detection and/or diagnosis of SARS-CoV-2 by  FDA under an Emergency Use Authorization (EUA). This EUA will remain  in effect (meaning this test can be used) for the duration of the  Covid-19 declaration under Section 564(b)(1) of the Act, 21  U.S.C. section 360bbb-3(b)(1), unless the authorization is  terminated or revoked. Performed at [facility], [address] [place], [place] [address]     Radiology Studies: DG Chest [place] 1 View  Result Date: 04/25/2020 CLINICAL DATA:  Questionable sepsis. EXAM: PORTABLE CHEST 1 VIEW COMPARISON:  03/30/2020 FINDINGS: Cardiomediastinal contours and hilar structures are normal. Lungs are clear. No gross sign of effusion.  Leads project over the chest. On limited assessment no acute skeletal process. IMPRESSION: No acute cardiopulmonary process. Electronically Signed   By: Zetta Bills M.D.   On: 04/25/2020 11:46      [place] Pechacek [address] [place]  If 7PM-7AM, please contact night-coverage [email or web address] 04/26/2020, 11:00 AM

## 2020-04-27 ENCOUNTER — Inpatient Hospital Stay (HOSPITAL_COMMUNITY): Payer: Medicare Other

## 2020-04-27 ENCOUNTER — Encounter (HOSPITAL_COMMUNITY): Payer: Self-pay | Admitting: Internal Medicine

## 2020-04-27 DIAGNOSIS — R7989 Other specified abnormal findings of blood chemistry: Secondary | ICD-10-CM

## 2020-04-27 DIAGNOSIS — Z9989 Dependence on other enabling machines and devices: Secondary | ICD-10-CM

## 2020-04-27 DIAGNOSIS — A419 Sepsis, unspecified organism: Principal | ICD-10-CM

## 2020-04-27 DIAGNOSIS — N390 Urinary tract infection, site not specified: Secondary | ICD-10-CM

## 2020-04-27 DIAGNOSIS — R0602 Shortness of breath: Secondary | ICD-10-CM

## 2020-04-27 DIAGNOSIS — M7989 Other specified soft tissue disorders: Secondary | ICD-10-CM

## 2020-04-27 DIAGNOSIS — G4733 Obstructive sleep apnea (adult) (pediatric): Secondary | ICD-10-CM

## 2020-04-27 LAB — RENAL FUNCTION PANEL
Albumin: 3 g/dL — ABNORMAL LOW (ref 3.5–5.0)
Anion gap: 10 (ref 5–15)
BUN: 13 mg/dL (ref 8–23)
CO2: 22 mmol/L (ref 22–32)
Calcium: 8.3 mg/dL — ABNORMAL LOW (ref 8.9–10.3)
Chloride: 104 mmol/L (ref 98–111)
Creatinine, Ser: 0.61 mg/dL (ref 0.44–1.00)
GFR, Estimated: >60 mL/min (ref >60)
Glucose, Bld: 106 mg/dL — ABNORMAL HIGH (ref 70–99)
Phosphorus: 1.9 mg/dL — ABNORMAL LOW (ref 2.5–4.6)
Potassium: 3.7 mmol/L (ref 3.5–5.1)
Sodium: 136 mmol/L (ref 135–145)

## 2020-04-27 LAB — CBC
HCT: 37.4 % (ref 36.0–46.0)
Hemoglobin: 11.6 g/dL — ABNORMAL LOW (ref 12.0–15.0)
MCH: 29.4 pg (ref 26.0–34.0)
MCHC: 31 g/dL (ref 30.0–36.0)
MCV: 94.9 fL (ref 80.0–100.0)
Platelets: 156 10*3/uL (ref 150–400)
RBC: 3.94 MIL/uL (ref 3.87–5.11)
RDW: 14.1 % (ref 11.5–15.5)
WBC: 4.1 10*3/uL (ref 4.0–10.5)
nRBC: 0 % (ref 0.0–0.2)

## 2020-04-27 LAB — ECHOCARDIOGRAM COMPLETE
AR max vel: 2.06 cm2
AV Area VTI: 1.94 cm2
AV Area mean vel: 2.01 cm2
AV Mean grad: 7.4 mmHg
AV Peak grad: 16 mmHg
Ao pk vel: 2 m/s
Area-P 1/2: 3.99 cm2
Calc EF: 53.2 %
Height: 65 in
S' Lateral: 3.89 cm
Single Plane A2C EF: 50.2 %
Single Plane A4C EF: 53.1 %
Weight: 4240 oz

## 2020-04-27 LAB — URINE CULTURE: Culture: NO GROWTH

## 2020-04-27 LAB — MAGNESIUM: Magnesium: 2.1 mg/dL (ref 1.7–2.4)

## 2020-04-27 LAB — D-DIMER, QUANTITATIVE: D-Dimer, Quant: 1.13 ug/mL-FEU — ABNORMAL HIGH (ref 0.00–0.50)

## 2020-04-27 MED ORDER — POTASSIUM PHOSPHATES 15 MMOLE/5ML IV SOLN
30.0000 mmol | Freq: Once | INTRAVENOUS | Status: AC
  Administered 2020-04-27: 30 mmol via INTRAVENOUS
  Filled 2020-04-27: qty 10

## 2020-04-27 MED ORDER — FUROSEMIDE 10 MG/ML IJ SOLN
40.0000 mg | Freq: Four times a day (QID) | INTRAMUSCULAR | Status: AC
  Administered 2020-04-27 (×2): 40 mg via INTRAVENOUS
  Filled 2020-04-27 (×2): qty 4

## 2020-04-27 MED ORDER — IPRATROPIUM-ALBUTEROL 20-100 MCG/ACT IN AERS
1.0000 | INHALATION_SPRAY | Freq: Four times a day (QID) | RESPIRATORY_TRACT | Status: DC | PRN
  Administered 2020-04-27: 1 via RESPIRATORY_TRACT
  Filled 2020-04-27: qty 4

## 2020-04-27 MED ORDER — IOHEXOL 350 MG/ML SOLN
100.0000 mL | Freq: Once | INTRAVENOUS | Status: AC | PRN
  Administered 2020-04-27: 100 mL via INTRAVENOUS

## 2020-04-27 MED ORDER — DOXYCYCLINE HYCLATE 100 MG PO TABS
100.0000 mg | ORAL_TABLET | Freq: Two times a day (BID) | ORAL | Status: DC
  Administered 2020-04-27 – 2020-04-29 (×5): 100 mg via ORAL
  Filled 2020-04-27 (×6): qty 1

## 2020-04-27 MED ORDER — LOSARTAN POTASSIUM 50 MG PO TABS
50.0000 mg | ORAL_TABLET | Freq: Every day | ORAL | Status: DC
  Administered 2020-04-27 – 2020-04-29 (×3): 50 mg via ORAL
  Filled 2020-04-27 (×3): qty 1

## 2020-04-27 NOTE — Progress Notes (Addendum)
PROGRESS NOTE  Brittany Middleton PYP:[id number] DOB: 11/14/1950   PCP: Maurice Small, MD  Patient is from: Home.  DOA: 04/25/2020 LOS: 2  Chief complaints:   Brief Narrative / Interim history: 69 year old female with history of HTN, HLD, OSA on CPAP, morbid obesity, RCA aneurysm, recent UTI and GERD presenting with UTI symptoms including dysuria, urgency, frequency, generalized weakness and episodic fever for 2 to 3 days.  She also has some dyspnea and dry cough.    Patient was hospitalized about 3-4 weeks ago for clinical pyelonephritis and hypoxic respiratory failure with diffuse wheezing and treated with IV ceftriaxone for 3 days and discharged on PPI, albuterol inhalers and Flonase for respiratory symptoms.  Urine culture was negative at that time.  She has upcoming appointment with pulmonology on 11/11 for PFT.  Of note, patient did not use her albuterol.   In ED, In ED, febrile to 103.2. RR 18> 26> 22.  BP and saturation within normal.  WBC 10.3 (higher than baseline).  LA 2.7> 2.2.  UA consistent with UTI.  CXR without significant finding.  Received a liter of LR bolus.  Cultures sent. Started on cefepime, Flagyl and vancomycin.  Admitted for severe sepsis due to UTI. COVID-19 PCR negative.  She was continued on cefepime and vancomycin on admission but deescalated to ceftriaxone.  Urine and blood culture NGTD. Continues to have intermittent fever. She also have significant shortness of breath, cough and difficulty speaking in full sentence without CPAP. BNP 124 but no clinical signs of fluid overload. Started on breathing treatments. Also added doxycycline for CAP coverage. D-dimer elevated.  CTA chest, LE venous Doppler, TTE and brought RVP panel ordered.   Subjective: Seen and examined earlier this morning. Spiked fever to 102 overnight. She says she coughs and easily run out of breath without his CPAP mask and oxygen. UTI symptoms improving. Denies GI symptoms. Denies chest pain,  orthopnea or PND.   Objective: Vitals:   04/26/20 1248 04/26/20 1352 04/26/20 2052 04/27/20 0526  BP:  (!) 120/42 (!) 122/52 (!) 138/49  Pulse:  74 64 66  Resp:  (!) 25 (!) 22 20  Temp: (!) 102.4 F (39.1 C) (!) 100.4 F (38 C) 99.8 F (37.7 C) 99.7 F (37.6 C)  TempSrc: Oral Oral Oral Oral  SpO2:  95% 94% 92%  Weight:      Height:        Intake/Output Summary (Last 24 hours) at 04/27/2020 1123 Last data filed at 04/27/2020 0514 Gross per 24 hour  Intake 240 ml  Output 1850 ml  Net -1610 ml   Filed Weights   04/25/20 1517  Weight: 120.2 kg    Examination:  GENERAL: No apparent distress.  Nontoxic. HEENT: MMM.  Vision and hearing grossly intact.  NECK: Supple.  No apparent JVD.  RESP: 92% on CPAP/2 L. No IWOB. Diminished aeration bilaterally. CVS:  RRR. Heart sounds normal.  ABD/GI/GU: BS+. Abd soft, NTND.  MSK/EXT:  Moves extremities. No apparent deformity. No edema.  SKIN: no apparent skin lesion or wound NEURO: Awake, alert and oriented appropriately.  No apparent focal neuro deficit. PSYCH: Calm. Normal affect.  Procedures:  None  Microbiology summarized: RVP negative. Blood cultures NGTD. Urine culture NGTD  Assessment & Plan: Severe sepsis due to urinary tract infection: POA.  Met criteria with fever, tachypnea and lactic acidosis.  She had UTI symptoms. UA was concerning for UTI but blood and urine cultures NGTD. No new back pain or CVA tenderness. No history  of ESBL or MDR.  -Continue IV ceftriaxone empirically at least for 5 days  Dyspnea/cough: concern for COPD exacerbation although no formal diagnosis. She did not smoke but grew around adult smoker. She has OSA. She was to have PFT on 11/11. 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 BNP 124 but no signs of fluid overload. Excellent urine output without diuretics. Appears euvolemic. Slightly elevated D-dimer. -Budesonide and as needed Respimat -Anoro Ellipta -CTA chest, LE venous Doppler, TTE and broader RVP   -Continue ceftriaxone as above. Add doxycycline for atypical coverage -Continue PPI and Flonase -PCCM consulted.  Elevated D-dimer: 1.13. -CTA chest and LE [place] to exclude VTE  OSA on CPAP:  -Continue nightly CPAP  Nonobstructive CAD/RCA aneurysm: No chest pain -Continue statin and Plavix -TTE as above  Hypokalemia: Likely due to HCTZ.  Resolved. -Continue holding HCTZ  Essential hypertension: On lisinopril and HCTZ at home. Of note, lisinopril was discontinued previous admission due to respiratory symptoms but resumed by cardiologist -Change lisinopril to losartan  Prolonged QTc: 527  -Avoid QT prolonged drugs -Recheck EKG in am -Optimize Mg and K  Hyperlipidemia -Continue home Crestor  GERD/hiatal hernia -Continue PPI.  Debility/physical deconditioning in the setting of acute illness -OOB/PT/OT  Morbid obesity Body mass index is 44.1 kg/m.  -Encourage lifestyle change to lose weight.       DVT prophylaxis:  On subcu Lovenox  Code Status: Full code Family Communication: Updated patient's son at bedside. Status is: Inpatient  Remains inpatient appropriate because:Ongoing diagnostic testing needed not appropriate for outpatient work up, IV treatments appropriate due to intensity of illness or inability to take PO and Inpatient level of care appropriate due to severity of illness   Dispo: The patient is from: Home              Anticipated d/c is to: Home              Anticipated d/c date is: 2 days              Patient currently is not medically stable to d/c.       Consultants:  PCCM   Sch Meds:  Scheduled Meds: . budesonide (PULMICORT) nebulizer solution  0.5 mg Nebulization BID  . clopidogrel  75 mg Oral Q supper  . doxycycline  100 mg Oral Q12H  . enoxaparin (LOVENOX) injection  60 mg Subcutaneous Q24H  . fluticasone  1 spray Each Nare Daily  . losartan  50 mg Oral Daily  . multivitamin with minerals  1 tablet Oral Daily  . pantoprazole   40 mg Oral Daily  . rosuvastatin  20 mg Oral QPC supper  . umeclidinium-vilanterol  1 puff Inhalation Daily   Continuous Infusions: . cefTRIAXone (ROCEPHIN)  IV 2 g (04/26/20 1500)  . potassium PHOSPHATE IVPB (in mmol)     PRN Meds:.acetaminophen **OR** acetaminophen, guaiFENesin, Ipratropium-Albuterol, ondansetron **OR** ondansetron (ZOFRAN) IV  Antimicrobials: Anti-infectives (From admission, onward)   Start     Dose/Rate Route Frequency Ordered Stop   04/27/20 1100  doxycycline (VIBRA-TABS) tablet 100 mg        100 mg Oral Every 12 hours 04/27/20 1029 05/02/20 0959   04/26/20 1400  cefTRIAXone (ROCEPHIN) 2 g in sodium chloride 0.9 % 100 mL IVPB        2 g 200 mL/hr over 30 Minutes Intravenous Every 24 hours 04/26/20 1304     04/26/20 0100  vancomycin (VANCOCIN) IVPB 1000 mg/200 mL premix  Status:  Discontinued  1,000 mg 200 mL/hr over 60 Minutes Intravenous Every 12 hours 04/25/20 1222 04/26/20 1304   04/25/20 2000  ceFEPIme (MAXIPIME) 2 g in sodium chloride 0.9 % 100 mL IVPB  Status:  Discontinued        2 g 200 mL/hr over 30 Minutes Intravenous Every 8 hours 04/25/20 1222 04/26/20 1304   04/25/20 1215  vancomycin (VANCOCIN) IVPB 1000 mg/200 mL premix        1,000 mg 200 mL/hr over 60 Minutes Intravenous  Once 04/25/20 1111 04/25/20 1400   04/25/20 1136  sodium chloride 0.9 % with ceFEPIme (MAXIPIME) ADS Med       Note to Pharmacy: Danie Binder : cabinet override      04/25/20 1136 04/25/20 2344   04/25/20 1115  ceFEPIme (MAXIPIME) 2 g in sodium chloride 0.9 % 100 mL IVPB        2 g 200 mL/hr over 30 Minutes Intravenous  Once 04/25/20 1105 04/25/20 1234   04/25/20 1115  metroNIDAZOLE (FLAGYL) IVPB 500 mg        500 mg 100 mL/hr over 60 Minutes Intravenous  Once 04/25/20 1105 04/25/20 1234   04/25/20 1115  vancomycin (VANCOCIN) IVPB 1000 mg/200 mL premix        1,000 mg 200 mL/hr over 60 Minutes Intravenous  Once 04/25/20 1105 04/25/20 1520       I have  personally reviewed the following labs and images: CBC: Recent Labs  Lab 04/25/20 1124 04/26/20 0532 04/27/20 0544  WBC 10.3 10.5 4.1  NEUTROABS 9.7*  --   --   HGB 13.5 11.7* 11.6*  HCT 40.8 37.3 37.4  MCV 90.9 94.0 94.9  PLT 179 148* 156   BMP &GFR Recent Labs  Lab 04/25/20 1124 04/26/20 0532 04/27/20 0544  NA 138 135 136  K 3.1* 3.7 3.7  CL 101 106 104  CO2 _0 GLUCOSE 131* 108* 106*  BUN _1 CREATININE 0.81 0.73 0.61  CALCIUM 8.7* 7.7* 8.3*  MG  --   --  2.1  PHOS  --   --  1.9*   Estimated Creatinine Clearance: 88.7 mL/min (by C-G formula based on SCr of 0.61 mg/dL). Liver & Pancreas: Recent Labs  Lab 04/25/20 1124 04/27/20 0544  AST 27  --   ALT 18  --   ALKPHOS 45  --   BILITOT 1.2  --   PROT 6.8  --   ALBUMIN 3.9 3.0*   No results for input(s): LIPASE, AMYLASE in the last 168 hours. No results for input(s): AMMONIA in the last 168 hours. Diabetic: No results for input(s): HGBA1C in the last 72 hours. No results for input(s): GLUCAP in the last 168 hours. Cardiac Enzymes: No results for input(s): CKTOTAL, CKMB, CKMBINDEX, TROPONINI in the last 168 hours. No results for input(s): PROBNP in the last 8760 hours. Coagulation Profile: Recent Labs  Lab 04/25/20 1124  INR 1.0   Thyroid Function Tests: No results for input(s): TSH, T4TOTAL, FREET4, T3FREE, THYROIDAB in the last 72 hours. Lipid Profile: No results for input(s): CHOL, HDL, LDLCALC, TRIG, CHOLHDL, LDLDIRECT in the last 72 hours. Anemia Panel: No results for input(s): VITAMINB12, FOLATE, FERRITIN, TIBC, IRON, RETICCTPCT in the last 72 hours. Urine analysis:    Component Value Date/Time   COLORURINE ORANGE (A) 04/25/2020 1332   APPEARANCEUR CLEAR 04/25/2020 1332   LABSPEC 1.010 04/25/2020 1332   PHURINE 5.5 04/25/2020 1332   GLUCOSEU 100 (A) 04/25/2020 1332   HGBUR  SMALL (A) 04/25/2020 1332   BILIRUBINUR NEGATIVE 04/25/2020 1332   KETONESUR NEGATIVE 04/25/2020 1332    PROTEINUR NEGATIVE 04/25/2020 1332   UROBILINOGEN 1.0 10/31/2014 1646   NITRITE POSITIVE (A) 04/25/2020 1332   LEUKOCYTESUR SMALL (A) 04/25/2020 1332   Sepsis Labs: Invalid input(s): PROCALCITONIN, [place]  Microbiology: Recent Results (from the past 240 hour(s))  Blood Culture (routine x 2)     Status: None (Preliminary result)   Collection Time: 04/25/20 11:18 AM   Specimen: BLOOD LEFT ARM  Result Value Ref Range Status   Specimen Description   Final    BLOOD LEFT ARM Performed at [facility], [address] [place], [place] [address]    Special Requests   Final    BOTTLES DRAWN AEROBIC AND ANAEROBIC Blood Culture adequate volume Performed at [facility], [address] [place], [place] [address]    Culture   Final    NO GROWTH 2 DAYS Performed at [facility], [place] [address] [place], [place] [address]    Report Status PENDING  Incomplete  Blood Culture (routine x 2)     Status: None (Preliminary result)   Collection Time: 04/25/20 11:22 AM   Specimen: BLOOD RIGHT ARM  Result Value Ref Range Status   Specimen Description   Final    BLOOD RIGHT ARM Performed at [facility], [address] [place], [place] [address]    Special Requests   Final    BOTTLES DRAWN AEROBIC AND ANAEROBIC Blood Culture adequate volume Performed at [facility], [address] [place], [place] [address]    Culture   Final    NO GROWTH 2 DAYS Performed at [facility], [place] [address], [place], [place] [address]    Report Status PENDING  Incomplete  Respiratory Panel by RT PCR (Flu A&B, Covid) - Peripheral     Status: None   Collection Time: 04/25/20 11:24 AM   Specimen: Peripheral; Nasopharyngeal  Result Value Ref Range Status   SARS Coronavirus 2 by RT PCR NEGATIVE NEGATIVE Final    Comment: (NOTE) SARS-CoV-2 target nucleic acids are NOT DETECTED.  The SARS-CoV-2 RNA is generally detectable in upper respiratoy specimens  during the acute phase of infection. The lowest concentration of SARS-CoV-2 viral copies this assay can detect is 131 copies/mL. A negative result does not preclude SARS-Cov-2 infection and should not be used as the sole basis for treatment or other patient management decisions. A negative result may occur with  improper specimen collection/handling, submission of specimen other than nasopharyngeal swab, presence of viral mutation(s) within the areas targeted by this assay, and inadequate number of viral copies (<131 copies/mL). A negative result must be combined with clinical observations, patient history, and epidemiological information. The expected result is Negative.  Fact Sheet for Patients:  PinkCheek.be  Fact Sheet for Healthcare Providers:  GravelBags.it  This test is no t yet approved or cleared by the [place] FDA and  has been authorized for detection and/or diagnosis of SARS-CoV-2 by FDA under an Emergency Use Authorization (EUA). This EUA will remain  in effect (meaning this test can be used) for the duration of the COVID-19 declaration under Section 564(b)(1) of the Act, 21 U.S.C. section 360bbb-3(b)(1), unless the authorization is terminated or revoked sooner.     Influenza A by PCR NEGATIVE NEGATIVE Final   Influenza B by PCR NEGATIVE NEGATIVE Final    Comment: (NOTE) The  Xpert Xpress SARS-CoV-2/FLU/RSV assay is intended as an aid in  the diagnosis of influenza from Nasopharyngeal swab specimens and  should not be used as a sole basis for treatment. Nasal washings and  aspirates are unacceptable for Xpert Xpress SARS-CoV-2/FLU/RSV  testing.  Fact Sheet for Patients: PinkCheek.be  Fact Sheet for Healthcare Providers: GravelBags.it  This test is not yet approved or cleared by the [place] FDA and  has been authorized for detection and/or  diagnosis of SARS-CoV-2 by  FDA under an Emergency Use Authorization (EUA). This EUA will remain  in effect (meaning this test can be used) for the duration of the  Covid-19 declaration under Section 564(b)(1) of the Act, 21  U.S.C. section 360bbb-3(b)(1), unless the authorization is  terminated or revoked. Performed at [facility], [address] [place], [place] [address]   Urine culture     Status: None   Collection Time: 04/25/20  1:32 PM   Specimen: In/Out Cath Urine  Result Value Ref Range Status   Specimen Description   Final    IN/OUT CATH URINE Performed at [facility], [address] [place], [place] [address]    Special Requests   Final    NONE Performed at [facility], [address] [place], [place] [address]    Culture   Final    NO GROWTH Performed at [facility], [place] [address] [place], [place] [address]    Report Status 04/27/2020 FINAL  Final    Radiology Studies: No results found.    Taye T. Dover  If 7PM-7AM, please contact night-coverage [email or web address] 04/27/2020, 11:23 AM

## 2020-04-27 NOTE — Progress Notes (Signed)
  Echocardiogram 2D Echocardiogram has been performed.  Darlina Sicilian M 04/27/2020, 9:57 AM
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
## 2020-04-27 NOTE — Progress Notes (Signed)
Bilateral lower extremity venous duplex completed. Refer to "CV Proc" under chart review to view preliminary results.  04/27/2020 3:01 PM Kelby Aline., MHA, RVT, RDCS, RDMS

## 2020-04-28 ENCOUNTER — Inpatient Hospital Stay: Payer: Medicare Other | Admitting: Pulmonary Disease

## 2020-04-28 DIAGNOSIS — N390 Urinary tract infection, site not specified: Secondary | ICD-10-CM | POA: Diagnosis not present

## 2020-04-28 DIAGNOSIS — E876 Hypokalemia: Secondary | ICD-10-CM

## 2020-04-28 DIAGNOSIS — I10 Essential (primary) hypertension: Secondary | ICD-10-CM

## 2020-04-28 DIAGNOSIS — A419 Sepsis, unspecified organism: Secondary | ICD-10-CM | POA: Diagnosis not present

## 2020-04-28 DIAGNOSIS — E7800 Pure hypercholesterolemia, unspecified: Secondary | ICD-10-CM

## 2020-04-28 LAB — RENAL FUNCTION PANEL
Albumin: 3.6 g/dL (ref 3.5–5.0)
Anion gap: 10 (ref 5–15)
BUN: 9 mg/dL (ref 8–23)
CO2: 29 mmol/L (ref 22–32)
Calcium: 8.7 mg/dL — ABNORMAL LOW (ref 8.9–10.3)
Chloride: 98 mmol/L (ref 98–111)
Creatinine, Ser: 0.61 mg/dL (ref 0.44–1.00)
GFR, Estimated: >60 mL/min (ref >60)
Glucose, Bld: 115 mg/dL — ABNORMAL HIGH (ref 70–99)
Phosphorus: 3.1 mg/dL (ref 2.5–4.6)
Potassium: 3.3 mmol/L — ABNORMAL LOW (ref 3.5–5.1)
Sodium: 137 mmol/L (ref 135–145)

## 2020-04-28 LAB — CBC
HCT: 39.4 % (ref 36.0–46.0)
Hemoglobin: 12.4 g/dL (ref 12.0–15.0)
MCH: 29 pg (ref 26.0–34.0)
MCHC: 31.5 g/dL (ref 30.0–36.0)
MCV: 92.1 fL (ref 80.0–100.0)
Platelets: 189 10*3/uL (ref 150–400)
RBC: 4.28 MIL/uL (ref 3.87–5.11)
RDW: 13.8 % (ref 11.5–15.5)
WBC: 4.2 10*3/uL (ref 4.0–10.5)
nRBC: 0 % (ref 0.0–0.2)

## 2020-04-28 LAB — MAGNESIUM: Magnesium: 2 mg/dL (ref 1.7–2.4)

## 2020-04-28 MED ORDER — POTASSIUM CHLORIDE CRYS ER 20 MEQ PO TBCR
40.0000 meq | EXTENDED_RELEASE_TABLET | Freq: Two times a day (BID) | ORAL | Status: DC
  Administered 2020-04-28 – 2020-04-29 (×2): 40 meq via ORAL
  Filled 2020-04-28 (×2): qty 2

## 2020-04-28 MED ORDER — FUROSEMIDE 10 MG/ML IJ SOLN
40.0000 mg | Freq: Once | INTRAMUSCULAR | Status: AC
  Administered 2020-04-28: 40 mg via INTRAVENOUS
  Filled 2020-04-28: qty 4

## 2020-04-28 MED ORDER — POTASSIUM CHLORIDE CRYS ER 20 MEQ PO TBCR
40.0000 meq | EXTENDED_RELEASE_TABLET | Freq: Once | ORAL | Status: AC
  Administered 2020-04-28: 40 meq via ORAL
  Filled 2020-04-28: qty 2

## 2020-04-28 NOTE — Progress Notes (Signed)
PROGRESS NOTE  Brittany Middleton TLX:[id number] DOB: 1950-07-07 DOA: 04/25/2020 PCP: Maurice Small, MD   LOS: 3 days   Brief narrative: As per HPI,  69 year old female with history of hypertension, hyperlipidemia, obstructive sleep apnea on CPAP, morbid obesity, recent UTI and GERD presented to the hospital with dysuria urgency frequency and generalized weakness with fever for 2 to 3 days prior to presentation with some dyspnea and dry cough as well.  Patient was recently admitted for acute pyelonephritis 3 to 4 weeks back and hypoxic respiratory failure and was treated for the same.  Urine culture was negative at that time.  She did have an appointment with pulmonary for PFT as outpatient.  In the ED patient was noted to be febrile with mild leukocytosis.  Lactic acid was elevated at vital.  UA showed features consistent with UTI.  Chest x-ray was negative.  Patient received septic fluid bolus and broad-spectrum antibiotic for severe sepsis on presentation.COVID-19 PCR was negative.    Urine and blood culture NGTD.  Patient also stated difficulty with breathing and has required CPAP she did have fluid resuscitation during hospitalization and has responded with IV diuretics.  Still on supplemental oxygen. CTA chest did not show any evidence of pulmonary embolism but evidence of pulmonary vascular congestion and pleural effusion. LE venous Doppler was negative for DVT.  Echocardiogram showed preserved LV function.  Assessment/Plan:  Principal Problem:   Sepsis secondary to UTI [facility]) Active Problems:   HTN (hypertension)   Hypercholesterolemia   Morbid obesity (HCC)   Hypokalemia   Severe sepsis due to urinary tract infection: Present on admission.  Patient met sepsis criteria with fever, tachypnea and lactic acid elevation with the UTI.  At this time patient has responded with IV Rocephin.  Dyspnea/cough:  Patient was supposed to follow-up with pulmonary as outpatient from previous  hospitalization.  Patient with history of obstructive sleep apnea.  Will benefit from outpatient PFT and sleep study.  Patient was seen by pulmonary for dyspnea and received IV Lasix with significant improvement in diuresis.  Continue budesonide and Anoro Ellipta.  CT angiogram of the chest negative for PE but with vascular congestion.  Duplex ultrasound negative for lower extremity DVT.  Patient has been taken off lisinopril due to cough and has been transitioned to losartan.  Elevated D-dimer: 1.13.  CT angiogram of the chest and ultrasound of the leg was negative for PE and DVT respectively.  OSA on CPAP:  -Continue nightly CPAP, patient has not had a sleep study in last 6 years or so.  Recommended outpatient sleep study on discharge.  Nonobstructive CAD/RCA aneurysm: Denies any chest pain.  Continue statins Plavix.  2D echocardiogram reviewed  Hypokalemia: Likely due to HCTZ.    Hold HCTZ for now.  Replenish electrolytes.  Currently getting IV Lasix.  Essential hypertension: On lisinopril and HCTZ at home.   Lisinopril has been changed to losartan at this time.  Prolonged QTC.  Avoid QTC prolonging medication.  Continue to replenish electrolytes.  Potassium mildly low at 3.3 today.  We will continue to replenish.  Magnesium of 2.0.  Check EKG in a.m.  Hyperlipidemia Continue Crestor  GERD/hiatal hernia -Continue PPI.  Debility/physical deconditioning in the setting of acute illness We will get PT OT evaluation.  Morbid obesity Body mass index is 44.1 kg/m. Would benefit from weight loss as outpatient.  DVT prophylaxis: Subcu Lovenox  Code Status: Full code  Family Communication: None  Status is: Inpatient  Remains inpatient appropriate because:IV  treatments appropriate due to intensity of illness or inability to take PO and Inpatient level of care appropriate due to severity of illness   Dispo: The patient is from: Home              Anticipated d/c is to: Home               Anticipated d/c date is: 2 days              Patient currently is not medically stable to d/c.  Consultants:  PCCM  Procedures:  None  Antibiotics:  Rocephin IV 04/26/20>  Anti-infectives (From admission, onward)   Start     Dose/Rate Route Frequency Ordered Stop   04/27/20 1100  doxycycline (VIBRA-TABS) tablet 100 mg        100 mg Oral Every 12 hours 04/27/20 1029 05/02/20 0959   04/26/20 1400  cefTRIAXone (ROCEPHIN) 2 g in sodium chloride 0.9 % 100 mL IVPB        2 g 200 mL/hr over 30 Minutes Intravenous Every 24 hours 04/26/20 1304     04/26/20 0100  vancomycin (VANCOCIN) IVPB 1000 mg/200 mL premix  Status:  Discontinued        1,000 mg 200 mL/hr over 60 Minutes Intravenous Every 12 hours 04/25/20 1222 04/26/20 1304   04/25/20 2000  ceFEPIme (MAXIPIME) 2 g in sodium chloride 0.9 % 100 mL IVPB  Status:  Discontinued        2 g 200 mL/hr over 30 Minutes Intravenous Every 8 hours 04/25/20 1222 04/26/20 1304   04/25/20 1215  vancomycin (VANCOCIN) IVPB 1000 mg/200 mL premix        1,000 mg 200 mL/hr over 60 Minutes Intravenous  Once 04/25/20 1111 04/25/20 1400   04/25/20 1136  sodium chloride 0.9 % with ceFEPIme (MAXIPIME) ADS Med       Note to Pharmacy: Danie Binder : cabinet override      04/25/20 1136 04/25/20 2344   04/25/20 1115  ceFEPIme (MAXIPIME) 2 g in sodium chloride 0.9 % 100 mL IVPB        2 g 200 mL/hr over 30 Minutes Intravenous  Once 04/25/20 1105 04/25/20 1234   04/25/20 1115  metroNIDAZOLE (FLAGYL) IVPB 500 mg        500 mg 100 mL/hr over 60 Minutes Intravenous  Once 04/25/20 1105 04/25/20 1234   04/25/20 1115  vancomycin (VANCOCIN) IVPB 1000 mg/200 mL premix        1,000 mg 200 mL/hr over 60 Minutes Intravenous  Once 04/25/20 1105 04/25/20 1520       Subjective: Today, patient was seen and examined at bedside.  Patient denies any chest pain, palpitation, dizziness, lightheadedness.  Has been diuresing well.  Has mild cough.  Improvement  of wheezing with bronchodilators.  Objective: Vitals:   04/28/20 0821 04/28/20 1415  BP:  (!) 106/55  Pulse:  60  Resp:    Temp:  98.7 F (37.1 C)  SpO2: 94% 96%    Intake/Output Summary (Last 24 hours) at 04/28/2020 1512 Last data filed at 04/28/2020 1414 Gross per 24 hour  Intake 1023.14 ml  Output 7350 ml  Net -6326.86 ml   Filed Weights   04/25/20 1517  Weight: 120.2 kg   Body mass index is 44.1 kg/m.   Physical Exam: GENERAL: Patient is alert awake and oriented. Not in obvious distress.  Morbidly obese, on nasal cannula oxygen at 3 L/min. HENT: No scleral pallor or icterus. Pupils equally reactive  to light. Oral mucosa is moist NECK: is supple, no gross swelling noted. CHEST: Coarse breath sounds noted..  Diminished breath sounds bilaterally. CVS: S1 and S2 heard, no murmur. Regular rate and rhythm.  ABDOMEN: Soft, non-tender, bowel sounds are present. EXTREMITIES: No edema. CNS: Cranial nerves are intact. No focal motor deficits. SKIN: warm and dry without rashes.  Data Review: I have personally reviewed the following laboratory data and studies,  CBC: Recent Labs  Lab 04/25/20 1124 04/26/20 0532 04/27/20 0544 04/28/20 0444  WBC 10.3 10.5 4.1 4.2  NEUTROABS 9.7*  --   --   --   HGB 13.5 11.7* 11.6* 12.4  HCT 40.8 37.3 37.4 39.4  MCV 90.9 94.0 94.9 92.1  PLT 179 148* 156 322   Basic Metabolic Panel: Recent Labs  Lab 04/25/20 1124 04/26/20 0532 04/27/20 0544 04/28/20 0444  NA 138 135 136 137  K 3.1* 3.7 3.7 3.3*  CL 101 106 104 98  CO2 _0 GLUCOSE 131* 108* 106* 115*  BUN _1 CREATININE 0.81 0.73 0.61 0.61  CALCIUM 8.7* 7.7* 8.3* 8.7*  MG  --   --  2.1 2.0  PHOS  --   --  1.9* 3.1   Liver Function Tests: Recent Labs  Lab 04/25/20 1124 04/27/20 0544 04/28/20 0444  AST 27  --   --   ALT 18  --   --   ALKPHOS 45  --   --   BILITOT 1.2  --   --   PROT 6.8  --   --   ALBUMIN 3.9 3.0* 3.6   No results for input(s):  LIPASE, AMYLASE in the last 168 hours. No results for input(s): AMMONIA in the last 168 hours. Cardiac Enzymes: No results for input(s): CKTOTAL, CKMB, CKMBINDEX, TROPONINI in the last 168 hours. BNP (last 3 results) Recent Labs    03/30/20 1708 04/26/20 0532  BNP 95.5 124.2*    ProBNP (last 3 results) No results for input(s): PROBNP in the last 8760 hours.  CBG: No results for input(s): GLUCAP in the last 168 hours. Recent Results (from the past 240 hour(s))  Blood Culture (routine x 2)     Status: None (Preliminary result)   Collection Time: 04/25/20 11:18 AM   Specimen: BLOOD LEFT ARM  Result Value Ref Range Status   Specimen Description   Final    BLOOD LEFT ARM Performed at [facility], [address] [place], [place] [address]    Special Requests   Final    BOTTLES DRAWN AEROBIC AND ANAEROBIC Blood Culture adequate volume Performed at [facility], [address] [place], [place] [address]    Culture   Final    NO GROWTH 3 DAYS Performed at [facility], [place] [address] [place], [place] [address]    Report Status PENDING  Incomplete  Blood Culture (routine x 2)     Status: None (Preliminary result)   Collection Time: 04/25/20 11:22 AM   Specimen: BLOOD RIGHT ARM  Result Value Ref Range Status   Specimen Description   Final    BLOOD RIGHT ARM Performed at [facility], [address] [place], [place] [address]    Special Requests   Final    BOTTLES DRAWN AEROBIC AND ANAEROBIC Blood Culture adequate volume Performed at [facility], [address], [place], [place] [address]    Culture  Final    NO GROWTH 3 DAYS Performed at [facility], [place] [address] [place], [place] [address]    Report Status PENDING  Incomplete  Respiratory Panel by RT PCR (Flu A&B, Covid) - Peripheral     Status: None   Collection Time: 04/25/20 11:24 AM   Specimen: Peripheral; Nasopharyngeal  Result Value Ref Range Status    SARS Coronavirus 2 by RT PCR NEGATIVE NEGATIVE Final    Comment: (NOTE) SARS-CoV-2 target nucleic acids are NOT DETECTED.  The SARS-CoV-2 RNA is generally detectable in upper respiratoy specimens during the acute phase of infection. The lowest concentration of SARS-CoV-2 viral copies this assay can detect is 131 copies/mL. A negative result does not preclude SARS-Cov-2 infection and should not be used as the sole basis for treatment or other patient management decisions. A negative result may occur with  improper specimen collection/handling, submission of specimen other than nasopharyngeal swab, presence of viral mutation(s) within the areas targeted by this assay, and inadequate number of viral copies (<131 copies/mL). A negative result must be combined with clinical observations, patient history, and epidemiological information. The expected result is Negative.  Fact Sheet for Patients:  PinkCheek.be  Fact Sheet for Healthcare Providers:  GravelBags.it  This test is no t yet approved or cleared by the [place] FDA and  has been authorized for detection and/or diagnosis of SARS-CoV-2 by FDA under an Emergency Use Authorization (EUA). This EUA will remain  in effect (meaning this test can be used) for the duration of the COVID-19 declaration under Section 564(b)(1) of the Act, 21 U.S.C. section 360bbb-3(b)(1), unless the authorization is terminated or revoked sooner.     Influenza A by PCR NEGATIVE NEGATIVE Final   Influenza B by PCR NEGATIVE NEGATIVE Final    Comment: (NOTE) The Xpert Xpress SARS-CoV-2/FLU/RSV assay is intended as an aid in  the diagnosis of influenza from Nasopharyngeal swab specimens and  should not be used as a sole basis for treatment. Nasal washings and  aspirates are unacceptable for Xpert Xpress SARS-CoV-2/FLU/RSV  testing.  Fact Sheet for  Patients: PinkCheek.be  Fact Sheet for Healthcare Providers: GravelBags.it  This test is not yet approved or cleared by the [place] FDA and  has been authorized for detection and/or diagnosis of SARS-CoV-2 by  FDA under an Emergency Use Authorization (EUA). This EUA will remain  in effect (meaning this test can be used) for the duration of the  Covid-19 declaration under Section 564(b)(1) of the Act, 21  U.S.C. section 360bbb-3(b)(1), unless the authorization is  terminated or revoked. Performed at [facility], [address] [place], [place] [address]   Urine culture     Status: None   Collection Time: 04/25/20  1:32 PM   Specimen: In/Out Cath Urine  Result Value Ref Range Status   Specimen Description   Final    IN/OUT CATH URINE Performed at [facility], [address] [place], [place] [address]    Special Requests   Final    NONE Performed at [facility], [address] [place], [place] [address]    Culture   Final    NO GROWTH Performed at [facility], [place] [address] [place], [place] [address]    Report Status 04/27/2020 FINAL  Final     Studies: CT ANGIO CHEST PE W OR WO CONTRAST  Result Date: 04/27/2020 CLINICAL DATA:  Elevated D-dimer.  Shortness of breath.  EXAM: CT ANGIOGRAPHY CHEST WITH CONTRAST TECHNIQUE: Multidetector CT imaging of the chest was performed using the standard protocol during bolus administration of intravenous contrast. Multiplanar CT image reconstructions and MIPs were obtained to evaluate the vascular anatomy. CONTRAST:  136m OMNIPAQUE IOHEXOL 350 MG/ML SOLN COMPARISON:  06/25/2019.  03/30/2020 FINDINGS: Cardiovascular: There is respiratory motion artifact that limits evaluation for acute pulmonary emboli. There is no pulmonary embolus or evidence of right heart strain. The size of the main pulmonary artery is normal. Heart size is normal,  with no pericardial effusion. The course and caliber of the aorta are normal. There is no atherosclerotic calcification. Opacification decreased due to pulmonary arterial phase contrast bolus timing. Again noted is an aberrant right subclavian artery, a normal variant. Mediastinum/Nodes: -- No mediastinal lymphadenopathy. -- No hilar lymphadenopathy. -- No axillary lymphadenopathy. -- No supraclavicular lymphadenopathy. -- Normal thyroid gland where visualized. -  Unremarkable esophagus. Lungs/Pleura: There are small bilateral pleural effusions with adjacent atelectasis. Again noted is some mild interlobular septal thickening. There is no pneumothorax. Upper Abdomen: Contrast bolus timing is not optimized for evaluation of the abdominal organs. The visualized portions of the organs of the upper abdomen are normal. Musculoskeletal: No chest wall abnormality. No bony spinal canal stenosis. Review of the MIP images confirms the above findings. IMPRESSION: 1. No acute pulmonary embolism. 2. Small bilateral pleural effusions with adjacent atelectasis. 3. Stable mild interlobular septal thickening, which may be seen in patients with mild interstitial edema. 4. Aberrant right subclavian artery, a normal variant. Aortic Atherosclerosis (ICD10-I70.0). Electronically Signed   By: CConstance HolsterM.D.   On: 04/27/2020 20:40   ECHOCARDIOGRAM COMPLETE  Result Date: 04/27/2020    ECHOCARDIOGRAM REPORT   [facility] Date of Exam: 04/27/2020 Medical Rec #:  [id number]          Height:       65.0 in Accession #:    [id number]         Weight:       265.0 lb Date of Birth:  2May 21, 1952          BSA:          2.229 m Patient Age:    89years            BP:           138/49 mmHg Patient Gender: F                   HR:           66 bpm. Exam Location:  Inpatient Procedure: 2D Echo Indications:    Dyspnea 786.09 / R06.00  History:        Patient has prior history of Echocardiogram examinations, most                  recent 02/28/2018. Risk Factors:Hypertension and Dyslipidemia.                 GERD. Sepsis secondary to UTI.  Sonographer:    CDeretha EmoryReferring Phys: [id number] T GONFA IMPRESSIONS  1. Left ventricular ejection fraction, by estimation, is 55 to 60%. The left ventricle has normal function. The left ventricle has no regional wall motion abnormalities. Left ventricular diastolic parameters were normal.  2. Right ventricular systolic function is normal. The right ventricular size is normal.  3. Left atrial size was mildly dilated.  4. The mitral valve is normal in structure. No evidence  of mitral valve regurgitation. No evidence of mitral stenosis.  5. The aortic valve was not well visualized. Aortic valve regurgitation is not visualized. No aortic stenosis is present.  6. The inferior vena cava is dilated in size with >50% respiratory variability, suggesting right atrial pressure of 8 mmHg. FINDINGS  Left Ventricle: Left ventricular ejection fraction, by estimation, is 55 to 60%. The left ventricle has normal function. The left ventricle has no regional wall motion abnormalities. The left ventricular internal cavity size was normal in size. There is  no left ventricular hypertrophy. Left ventricular diastolic parameters were normal. Right Ventricle: The right ventricular size is normal. No increase in right ventricular wall thickness. Right ventricular systolic function is normal. Left Atrium: Left atrial size was mildly dilated. Right Atrium: Right atrial size was normal in size. Pericardium: There is no evidence of pericardial effusion. Mitral Valve: The mitral valve is normal in structure. No evidence of mitral valve regurgitation. No evidence of mitral valve stenosis. Tricuspid Valve: The tricuspid valve is normal in structure. Tricuspid valve regurgitation is not demonstrated. No evidence of tricuspid stenosis. Aortic Valve: The aortic valve was not well visualized. Aortic valve regurgitation is  not visualized. No aortic stenosis is present. Aortic valve mean gradient measures 7.4 mmHg. Aortic valve peak gradient measures 16.0 mmHg. Aortic valve area, by VTI measures 1.94 cm. Pulmonic Valve: The pulmonic valve was not well visualized. Pulmonic valve regurgitation is not visualized. No evidence of pulmonic stenosis. Aorta: The aortic root is normal in size and structure. Pulmonary Artery: Indeterminant PASP, inadequate TR jet. Venous: The inferior vena cava is dilated in size with greater than 50% respiratory variability, suggesting right atrial pressure of 8 mmHg. IAS/Shunts: No atrial level shunt detected by color flow Doppler.  LEFT VENTRICLE PLAX 2D LVIDd:         5.17 cm      Diastology LVIDs:         3.89 cm      LV e' medial:    9.57 cm/s LV PW:         0.84 cm      LV E/e' medial:  12.4 LV IVS:        1.05 cm      LV e' lateral:   8.70 cm/s LVOT diam:     2.00 cm      LV E/e' lateral: 13.7 LV SV:         79 LV SV Index:   35 LVOT Area:     3.14 cm  LV Volumes (MOD) LV vol d, MOD A2C: 100.0 ml LV vol d, MOD A4C: 106.0 ml LV vol s, MOD A2C: 49.8 ml LV vol s, MOD A4C: 49.7 ml LV SV MOD A2C:     50.2 ml LV SV MOD A4C:     106.0 ml LV SV MOD BP:      57.1 ml RIGHT VENTRICLE RV S prime:     18.40 cm/s TAPSE (M-mode): 2.6 cm LEFT ATRIUM             Index       RIGHT ATRIUM           Index LA diam:        4.40 cm 1.97 cm/m  RA Area:     14.00 cm LA Vol (A2C):   65.8 ml 29.51 ml/m RA Volume:   29.60 ml  13.28 ml/m LA Vol (A4C):   65.2 ml 29.24 ml/m LA Biplane Vol: 65.6 ml  29.42 ml/m  AORTIC VALVE AV Area (Vmax):    2.06 cm AV Area (Vmean):   2.01 cm AV Area (VTI):     1.94 cm AV Vmax:           199.81 cm/s AV Vmean:          126.704 cm/s AV VTI:            0.406 m AV Peak Grad:      16.0 mmHg AV Mean Grad:      7.4 mmHg LVOT Vmax:         130.95 cm/s LVOT Vmean:        80.919 cm/s LVOT VTI:          0.251 m LVOT/AV VTI ratio: 0.62  AORTA Ao Root diam: 2.70 cm MITRAL VALVE MV Area (PHT): 3.99 cm      SHUNTS MV Decel Time: 190 msec     Systemic VTI:  0.25 m MV E velocity: 119.00 cm/s  Systemic Diam: 2.00 cm MV A velocity: 86.80 cm/s MV E/A ratio:  1.37 Carlyle Dolly MD Electronically signed by Carlyle Dolly MD Signature Date/Time: 04/27/2020/12:17:42 PM    Final    VAS [place] LOWER EXTREMITY VENOUS (DVT)  Result Date: 04/27/2020  Lower Venous DVT Study Indications: Elevated d-dimer=1.13.  Limitations: Body habitus, poor ultrasound/tissue interface and Unable to tolerate some compression secondary to pain. Comparison Study: No prior study Performing Technologist: Maudry Mayhew MHA, RDMS, RVT, RDCS  Examination Guidelines: A complete evaluation includes B-mode imaging, spectral Doppler, color Doppler, and power Doppler as needed of all accessible portions of each vessel. Bilateral testing is considered an integral part of a complete examination. Limited examinations for reoccurring indications may be performed as noted. The reflux portion of the exam is performed with the patient in reverse Trendelenburg.  +---------+---------------+---------+-----------+----------+--------------+ RIGHT    CompressibilityPhasicitySpontaneityPropertiesThrombus Aging +---------+---------------+---------+-----------+----------+--------------+ CFV      Full           Yes      Yes                                 +---------+---------------+---------+-----------+----------+--------------+ SFJ      Full                                                        +---------+---------------+---------+-----------+----------+--------------+ FV Prox  Full                                                        +---------+---------------+---------+-----------+----------+--------------+ FV Mid   Full                                                        +---------+---------------+---------+-----------+----------+--------------+ FV Distal               Yes      Yes        patent                    +---------+---------------+---------+-----------+----------+--------------+  PFV      Full                                                        +---------+---------------+---------+-----------+----------+--------------+ POP      Full           Yes      Yes                                 +---------+---------------+---------+-----------+----------+--------------+ PTV      Full                                                        +---------+---------------+---------+-----------+----------+--------------+ PERO     Full                                                        +---------+---------------+---------+-----------+----------+--------------+   +---------+---------------+---------+-----------+----------+--------------+ LEFT     CompressibilityPhasicitySpontaneityPropertiesThrombus Aging +---------+---------------+---------+-----------+----------+--------------+ CFV      Full           Yes      Yes                                 +---------+---------------+---------+-----------+----------+--------------+ SFJ      Full                                                        +---------+---------------+---------+-----------+----------+--------------+ FV Prox  Full                                                        +---------+---------------+---------+-----------+----------+--------------+ FV Mid   Full                                                        +---------+---------------+---------+-----------+----------+--------------+ FV Distal                                   patent                   +---------+---------------+---------+-----------+----------+--------------+ PFV      Full                                                        +---------+---------------+---------+-----------+----------+--------------+  POP      Full           Yes      Yes                                  +---------+---------------+---------+-----------+----------+--------------+ PTV      Full                                                        +---------+---------------+---------+-----------+----------+--------------+ PERO     Full                                                        +---------+---------------+---------+-----------+----------+--------------+     Summary: RIGHT: - There is no evidence of deep vein thrombosis in the lower extremity. However, portions of this examination were limited- see technologist comments above.  - No cystic structure found in the popliteal fossa.  LEFT: - There is no evidence of deep vein thrombosis in the lower extremity. However, portions of this examination were limited- see technologist comments above.  - No cystic structure found in the popliteal fossa.  *See table(s) above for measurements and observations. Electronically signed by Servando Snare MD on 04/27/2020 at 8:36:37 PM.    Final       Flora Lipps, MD  [facility] 04/28/2020

## 2020-04-28 NOTE — Care Management Important Message (Signed)
Important Message  Patient Details IM Letter given to the Patient Name: Brittany Middleton MRN: [id number] Date of Birth: May 20, 1951   Medicare Important Message Given:  Yes     Kerin Salen 04/28/2020, 11:32 AM

## 2020-04-28 NOTE — TOC Progression Note (Signed)
Transition of Care [facility]) - Progression Note    Patient Details  Name: Brittany Middleton MRN: [id number] Date of Birth: Jul 30, 1950  Transition of Care [facility]) CM/SW Contact  Purcell Mouton, RN Phone Number: 04/28/2020, 12:01 PM  Clinical Narrative:    TOC will Continue to follow pt. Pt from home alone with support.    Expected Discharge Plan: Home/Self Care Barriers to Discharge: No Barriers Identified  Expected Discharge Plan and Services Expected Discharge Plan: Home/Self Care       Living arrangements for the past 2 months: Single Family Home                                       Social Determinants of Health (SDOH) Interventions    Readmission Risk Interventions No flowsheet data found.

## 2020-04-28 NOTE — Progress Notes (Signed)
Called to room by Lab Tech stated place tourinquet on wrist patting hand, started to swell. Pressure applied for 10 minutes and placed ice pack to area. Will cont to assess.
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
## 2020-04-28 NOTE — Progress Notes (Signed)
Re assessed rt hand swell decrease, Instructed to keep ice pack for an hour,

## 2020-04-29 LAB — COMPREHENSIVE METABOLIC PANEL
ALT: 24 U/L (ref 0–44)
AST: 22 U/L (ref 15–41)
Albumin: 3.6 g/dL (ref 3.5–5.0)
Alkaline Phosphatase: 45 U/L (ref 38–126)
Anion gap: 10 (ref 5–15)
BUN: 11 mg/dL (ref 8–23)
CO2: 27 mmol/L (ref 22–32)
Calcium: 9 mg/dL (ref 8.9–10.3)
Chloride: 98 mmol/L (ref 98–111)
Creatinine, Ser: 0.64 mg/dL (ref 0.44–1.00)
GFR, Estimated: >60 mL/min (ref >60)
Glucose, Bld: 116 mg/dL — ABNORMAL HIGH (ref 70–99)
Potassium: 3.9 mmol/L (ref 3.5–5.1)
Sodium: 135 mmol/L (ref 135–145)
Total Bilirubin: 0.7 mg/dL (ref 0.3–1.2)
Total Protein: 6.6 g/dL (ref 6.5–8.1)

## 2020-04-29 LAB — RESPIRATORY PANEL BY PCR

## 2020-04-29 MED ORDER — HYDROCHLOROTHIAZIDE 25 MG PO TABS: 25.0000 mg | ORAL_TABLET | Freq: Every day | ORAL | 2 refills | Status: DC

## 2020-04-29 MED ORDER — K PHOS MONO-SOD PHOS DI & MONO 155-852-130 MG PO TABS
500.0000 mg | ORAL_TABLET | Freq: Once | ORAL | Status: AC
  Administered 2020-04-29: 500 mg via ORAL
  Filled 2020-04-29: qty 2

## 2020-04-29 MED ORDER — FLUTICASONE PROPIONATE HFA 110 MCG/ACT IN AERO: 2.0000 | INHALATION_SPRAY | Freq: Two times a day (BID) | RESPIRATORY_TRACT | 2 refills | Status: DC

## 2020-04-29 MED ORDER — CEFDINIR 300 MG PO CAPS: 300.0000 mg | ORAL_CAPSULE | Freq: Two times a day (BID) | ORAL | 0 refills | Status: AC

## 2020-04-29 MED ORDER — GUAIFENESIN 100 MG/5ML PO SOLN: 5.0000 mL | ORAL | 0 refills | Status: DC | PRN

## 2020-04-29 MED ORDER — UMECLIDINIUM-VILANTEROL 62.5-25 MCG/INH IN AEPB: 1.0000 | INHALATION_SPRAY | Freq: Every day | RESPIRATORY_TRACT | 2 refills | Status: DC

## 2020-04-29 MED ORDER — K PHOS MONO-SOD PHOS DI & MONO 155-852-130 MG PO TABS
500.0000 mg | ORAL_TABLET | Freq: Three times a day (TID) | ORAL | Status: DC
  Filled 2020-04-29: qty 2

## 2020-04-29 MED ORDER — LOSARTAN POTASSIUM 50 MG PO TABS
50.0000 mg | ORAL_TABLET | Freq: Every day | ORAL | 2 refills | Status: DC
Start: 2020-04-30 — End: 2021-04-21

## 2020-04-29 NOTE — Discharge Summary (Signed)
Physician Discharge Summary  Brittany Middleton OVF:[id number] DOB: December 15, 1950 DOA: 04/25/2020  PCP: Maurice Small, MD  Admit date: 04/25/2020 Discharge date: 04/29/2020  Admitted From: Home  Discharge disposition: Home   Recommendations for Outpatient Follow-Up:   . Follow up with your primary care provider in one week.  . Check CBC, BMP, magnesium in the next visit . Follow-up with pulmonary as has been scheduled including PFT.  Discharge Diagnosis:   Principal Problem:   Sepsis secondary to UTI [facility]) Active Problems:   HTN (hypertension)   Hypercholesterolemia   Morbid obesity ([place])   Hypokalemia   Discharge Condition: Improved.  Diet recommendation: Low sodium, heart healthy.    Wound care: None.  Code status: Full.   History of Present Illness:   69 year old female with history of hypertension, hyperlipidemia, obstructive sleep apnea on CPAP, morbid obesity, recent UTI and GERD presented to the hospital with dysuria, urgency frequency and generalized weakness with fever for 2 to 3 days prior to presentation with some dyspnea and dry cough as well.  Patient was recently admitted for acute pyelonephritis 3 to 4 weeks back and hypoxic respiratory failure and was treated for the same.  Urine culture was negative at that time.  She did have an appointment with pulmonary for PFT as outpatient.  In the ED, patient was noted to be febrile with mild leukocytosis.  Lactic acid was elevated as well.  UA showed features consistent with UTI.  Chest x-ray was negative.  Patient received septic fluid bolus and broad-spectrum antibiotic for severe sepsis on presentation.COVID-19 PCR was negative.    Patient was then admitted to hospital for further evaluation and treatment.  Patient also stated difficulty with breathing and has required CPAP she did have fluid resuscitation during hospitalization and has responded with IV diuretics.   CTA chest did not show any evidence of pulmonary  embolism but evidence of pulmonary vascular congestion and pleural effusion. LE venous Doppler was negative for DVT.  2D Echocardiogram showed preserved LV function.  Pulmonary also followed the patient during hospitalization.   Hospital Course:   Following conditions were addressed during hospitalization as listed below,  Severe sepsis due to urinary tract infection: Present on admission.  Patient met sepsis criteria with fever, tachypnea and lactic acid elevation with the UTI.  At this time, patient has responded with IV Rocephin.  Urine culture with no significant growth.  Will be continued on cefdinir to complete the course on discharge.  Dyspnea/cough: Patient was supposed to follow-up with pulmonary as outpatient from previous hospitalization.  Patient with history of obstructive sleep apnea.  Will benefit from outpatient PFT and sleep study.  Patient was seen by pulmonary during hospitalization for dyspnea and received IV Lasix with significant improvement dyspnea and cough..  Patient will be continued on bronchodilators on discharge.  CT angiogram of the chest negative for PE but with vascular congestion.  Duplex ultrasound negative for lower extremity DVT.  Patient has been taken off lisinopril due to cough and has been transitioned to losartan.  Elevated D-dimer: 1.13.  CT angiogram of the chest and ultrasound of the leg was negative for PE and DVT respectively.  OSA on CPAP:  Continue nightly CPAP, patient has not had a sleep study in last 6 years or so.  Recommended outpatient sleep study on discharge.  Nonobstructive CAD/RCA aneurysm: Denies any chest pain.  Continue statins Plavix.  2D echocardiogram reviewed  Hypokalemia: Likely due to HCTZ.    Electrolytes were replenished.  Will be resumed on discharge.  Patient will need to follow-up electrolytes after discharge.  Essential hypertension: On lisinopril and HCTZ at home.  Lisinopril has been changed to losartan at this  time.  Hyperlipidemia Continue Crestor  GERD/hiatal hernia -Continue PPI.  Debility/physical deconditioning in the setting of acute illness.  Improved.  Patient did not wish for physical therapy at home.  Morbid obesity Body mass index is 44.1 kg/m. Would benefit from weight loss as outpatient.  Disposition.  At this time, patient is stable for disposition home with outpatient PCP follow-up/pulmonary follow-up.  Medical Consultants:    Pulmonary.  Procedures:    None Subjective:   Today, patient was seen and examined at bedside.  Feels much better today.  She has been weaned off oxygen at this time.  Denies chest pain, shortness of breath, fever or chills.  Discharge Exam:   Vitals:   04/28/20 2200 04/29/20 0638  BP: (!) 127/51 (!) 143/67  Pulse: 62 64  Resp:  16  Temp: 98.2 F (36.8 C) 98.3 F (36.8 C)  SpO2:  93%   Vitals:   04/28/20 0821 04/28/20 1415 04/28/20 2200 04/29/20 0638  BP:  (!) 106/55 (!) 127/51 (!) 143/67  Pulse:  60 62 64  Resp:    16  Temp:  98.7 F (37.1 C) 98.2 F (36.8 C) 98.3 F (36.8 C)  TempSrc:  Oral Oral Oral  SpO2: 94% 96%  93%  Weight:      Height:        General: Alert awake, not in obvious distress, morbidly obese HENT: pupils equally reacting to light,  No scleral pallor or icterus noted. Oral mucosa is moist.  Chest: Coarse breath sounds noted bilaterally..  CVS: S1 &S2 heard. No murmur.  Regular rate and rhythm. Abdomen: Soft, nontender, nondistended.  Bowel sounds are heard.   Extremities: No cyanosis, clubbing or edema.  Peripheral pulses are palpable. Psych: Alert, awake and oriented, normal mood CNS:  No cranial nerve deficits.  Power equal in all extremities.   Skin: Warm and dry.  No rashes noted.  The results of significant diagnostics from this hospitalization (including imaging, microbiology, ancillary and laboratory) are listed below for reference.     Diagnostic Studies:   DG Chest Port 1  View  Result Date: 04/25/2020 CLINICAL DATA:  Questionable sepsis. EXAM: PORTABLE CHEST 1 VIEW COMPARISON:  03/30/2020 FINDINGS: Cardiomediastinal contours and hilar structures are normal. Lungs are clear. No gross sign of effusion.  Leads project over the chest. On limited assessment no acute skeletal process. IMPRESSION: No acute cardiopulmonary process. Electronically Signed   By: Zetta Bills M.D.   On: 04/25/2020 11:46     Labs:   Basic Metabolic Panel: Recent Labs  Lab 04/25/20 1124 04/25/20 1124 04/26/20 0532 04/26/20 0532 04/27/20 0544 04/27/20 0544 04/28/20 0444 04/29/20 0752  NA 138  --  135  --  136  --  137 135  K 3.1*   < > 3.7   < > 3.7   < > 3.3* 3.9  CL 101  --  106  --  104  --  98 98  CO2 25  --  23  --  22  --  29 27  GLUCOSE 131*  --  108*  --  106*  --  115* 116*  BUN 13  --  19  --  13  --  9 11  CREATININE 0.81  --  0.73  --  0.61  --  0.61 0.64  CALCIUM 8.7*  --  7.7*  --  8.3*  --  8.7* 9.0  MG  --   --   --   --  2.1  --  2.0  --   PHOS  --   --   --   --  1.9*  --  3.1  --    < > = values in this interval not displayed.   GFR Estimated Creatinine Clearance: 88.7 mL/min (by C-G formula based on SCr of 0.64 mg/dL). Liver Function Tests: Recent Labs  Lab 04/25/20 1124 04/27/20 0544 04/28/20 0444 04/29/20 0752  AST 27  --   --  22  ALT 18  --   --  24  ALKPHOS 45  --   --  45  BILITOT 1.2  --   --  0.7  PROT 6.8  --   --  6.6  ALBUMIN 3.9 3.0* 3.6 3.6   No results for input(s): LIPASE, AMYLASE in the last 168 hours. No results for input(s): AMMONIA in the last 168 hours. Coagulation profile Recent Labs  Lab 04/25/20 1124  INR 1.0    CBC: Recent Labs  Lab 04/25/20 1124 04/26/20 0532 04/27/20 0544 04/28/20 0444  WBC 10.3 10.5 4.1 4.2  NEUTROABS 9.7*  --   --   --   HGB 13.5 11.7* 11.6* 12.4  HCT 40.8 37.3 37.4 39.4  MCV 90.9 94.0 94.9 92.1  PLT 179 148* 156 189   Cardiac Enzymes: No results for input(s): CKTOTAL, CKMB,  CKMBINDEX, TROPONINI in the last 168 hours. BNP: Invalid input(s): POCBNP CBG: No results for input(s): GLUCAP in the last 168 hours. D-Dimer Recent Labs    04/27/20 1028  DDIMER 1.13*   Hgb A1c No results for input(s): HGBA1C in the last 72 hours. Lipid Profile No results for input(s): CHOL, HDL, LDLCALC, TRIG, CHOLHDL, LDLDIRECT in the last 72 hours. Thyroid function studies No results for input(s): TSH, T4TOTAL, T3FREE, THYROIDAB in the last 72 hours.  Invalid input(s): FREET3 Anemia work up No results for input(s): VITAMINB12, FOLATE, FERRITIN, TIBC, IRON, RETICCTPCT in the last 72 hours. Microbiology Recent Results (from the past 240 hour(s))  Blood Culture (routine x 2)     Status: None (Preliminary result)   Collection Time: 04/25/20 11:18 AM   Specimen: BLOOD LEFT ARM  Result Value Ref Range Status   Specimen Description   Final    BLOOD LEFT ARM Performed at [facility], [address] [place], [place] [address]    Special Requests   Final    BOTTLES DRAWN AEROBIC AND ANAEROBIC Blood Culture adequate volume Performed at [facility], [address] [place], [place] [address]    Culture   Final    NO GROWTH 4 DAYS Performed at [facility], [place] [address], [place], [place] [address]    Report Status PENDING  Incomplete  Blood Culture (routine x 2)     Status: None (Preliminary result)   Collection Time: 04/25/20 11:22 AM   Specimen: BLOOD RIGHT ARM  Result Value Ref Range Status   Specimen Description   Final    BLOOD RIGHT ARM Performed at [facility], [address] [place], [place] [address]    Special Requests   Final    BOTTLES DRAWN AEROBIC AND ANAEROBIC Blood Culture adequate volume Performed at [facility], [address] [place], [place] [address]  Culture   Final    NO GROWTH 4 DAYS Performed at [facility], [place] [address] [place], [place] [address]    Report Status PENDING   Incomplete  Respiratory Panel by RT PCR (Flu A&B, Covid) - Peripheral     Status: None   Collection Time: 04/25/20 11:24 AM   Specimen: Peripheral; Nasopharyngeal  Result Value Ref Range Status   SARS Coronavirus 2 by RT PCR NEGATIVE NEGATIVE Final    Comment: (NOTE) SARS-CoV-2 target nucleic acids are NOT DETECTED.  The SARS-CoV-2 RNA is generally detectable in upper respiratoy specimens during the acute phase of infection. The lowest concentration of SARS-CoV-2 viral copies this assay can detect is 131 copies/mL. A negative result does not preclude SARS-Cov-2 infection and should not be used as the sole basis for treatment or other patient management decisions. A negative result may occur with  improper specimen collection/handling, submission of specimen other than nasopharyngeal swab, presence of viral mutation(s) within the areas targeted by this assay, and inadequate number of viral copies (<131 copies/mL). A negative result must be combined with clinical observations, patient history, and epidemiological information. The expected result is Negative.  Fact Sheet for Patients:  PinkCheek.be  Fact Sheet for Healthcare Providers:  GravelBags.it  This test is no t yet approved or cleared by the [place] FDA and  has been authorized for detection and/or diagnosis of SARS-CoV-2 by FDA under an Emergency Use Authorization (EUA). This EUA will remain  in effect (meaning this test can be used) for the duration of the COVID-19 declaration under Section 564(b)(1) of the Act, 21 U.S.C. section 360bbb-3(b)(1), unless the authorization is terminated or revoked sooner.     Influenza A by PCR NEGATIVE NEGATIVE Final   Influenza B by PCR NEGATIVE NEGATIVE Final    Comment: (NOTE) The Xpert Xpress SARS-CoV-2/FLU/RSV assay is intended as an aid in  the diagnosis of influenza from Nasopharyngeal swab specimens and  should not  be used as a sole basis for treatment. Nasal washings and  aspirates are unacceptable for Xpert Xpress SARS-CoV-2/FLU/RSV  testing.  Fact Sheet for Patients: PinkCheek.be  Fact Sheet for Healthcare Providers: GravelBags.it  This test is not yet approved or cleared by the [place] FDA and  has been authorized for detection and/or diagnosis of SARS-CoV-2 by  FDA under an Emergency Use Authorization (EUA). This EUA will remain  in effect (meaning this test can be used) for the duration of the  Covid-19 declaration under Section 564(b)(1) of the Act, 21  U.S.C. section 360bbb-3(b)(1), unless the authorization is  terminated or revoked. Performed at [facility], [address] [place], [place] [address]   Urine culture     Status: None   Collection Time: 04/25/20  1:32 PM   Specimen: In/Out Cath Urine  Result Value Ref Range Status   Specimen Description   Final    IN/OUT CATH URINE Performed at [facility], [address] [place], [place] [address]    Special Requests   Final    NONE Performed at [facility], [address] [place], [place] [address]    Culture   Final    NO GROWTH Performed at [facility], [place] [address], [place], [place] [address]    Report Status 04/27/2020 FINAL  Final  Respiratory Panel by PCR     Status: None   Collection Time: 04/28/20  3:13 PM   Specimen: Nasopharyngeal  Swab; Respiratory  Result Value Ref Range Status   Adenovirus NOT DETECTED NOT DETECTED Final   Coronavirus 229E NOT DETECTED NOT DETECTED Final    Comment: (NOTE) The Coronavirus on the Respiratory Panel, DOES NOT test for the novel  Coronavirus (2019 nCoV)    Coronavirus HKU1 NOT DETECTED NOT DETECTED Final   Coronavirus NL63 NOT DETECTED NOT DETECTED Final   Coronavirus OC43 NOT DETECTED NOT DETECTED Final   Metapneumovirus NOT DETECTED NOT DETECTED Final   Rhinovirus /  Enterovirus NOT DETECTED NOT DETECTED Final   Influenza A NOT DETECTED NOT DETECTED Final   Influenza B NOT DETECTED NOT DETECTED Final   Parainfluenza Virus 1 NOT DETECTED NOT DETECTED Final   Parainfluenza Virus 2 NOT DETECTED NOT DETECTED Final   Parainfluenza Virus 3 NOT DETECTED NOT DETECTED Final   Parainfluenza Virus 4 NOT DETECTED NOT DETECTED Final   Respiratory Syncytial Virus NOT DETECTED NOT DETECTED Final   Bordetella pertussis NOT DETECTED NOT DETECTED Final   Chlamydophila pneumoniae NOT DETECTED NOT DETECTED Final   Mycoplasma pneumoniae NOT DETECTED NOT DETECTED Final    Comment: Performed at [facility], [place] [address], [place], [place] [address]     Discharge Instructions:   Discharge Instructions    Call MD for:  difficulty breathing, headache or visual disturbances   Complete by: As directed    Call MD for:  temperature >100.4   Complete by: As directed    Diet - low sodium heart healthy   Complete by: As directed    Discharge instructions   Complete by: As directed    Follow-up with your primary care provider in 1 week.  Follow-up with pulmonary as has been scheduled including PFTs.  Inhalers have been prescribed.   Increase activity slowly   Complete by: As directed      Allergies as of 04/29/2020      Reactions   Ciprofloxacin Other (See Comments)   Caused PAIN IN HANDS    Codeine Nausea And Vomiting      Medication List    STOP taking these medications   lisinopril-hydrochlorothiazide 20-25 MG tablet Commonly known as: ZESTORETIC     TAKE these medications   acetaminophen 500 MG tablet Commonly known as: TYLENOL Take 1,000 mg by mouth every 6 (six) hours as needed for mild pain, moderate pain, fever or headache.   albuterol (2.5 MG/3ML) 0.083% nebulizer solution Commonly known as: PROVENTIL Take 2.5 mg by nebulization every 6 (six) hours as needed for wheezing or shortness of breath.   albuterol 108 (90 Base) MCG/ACT  inhaler Commonly known as: VENTOLIN HFA Inhale 2 puffs into the lungs every 6 (six) hours as needed for wheezing or shortness of breath.   BIOTIN PO Take 1 tablet by mouth daily.   cefdinir 300 MG capsule Commonly known as: OMNICEF Take 1 capsule (300 mg total) by mouth 2 (two) times daily for 3 days.   clopidogrel 75 MG tablet Commonly known as: PLAVIX Take 1 tablet (75 mg total) by mouth daily. What changed: when to take this   fluticasone 110 MCG/ACT inhaler Commonly known as: FLOVENT HFA Inhale 2 puffs into the lungs 2 (two) times daily.   fluticasone 50 MCG/ACT nasal spray Commonly known as: FLONASE Place 1 spray into both nostrils daily. What changed:   when to take this  reasons to take this   guaiFENesin 100 MG/5ML Soln Commonly known as: ROBITUSSIN Take 5 mLs (100 mg total) by mouth every 4 (  four) hours as needed for cough or to loosen phlegm.   hydrochlorothiazide 25 MG tablet Commonly known as: HYDRODIURIL Take 1 tablet (25 mg total) by mouth daily.   hydrocortisone 2.5 % cream Apply 1 application topically daily.   IRON PO Take 1 tablet by mouth 2 (two) times a week.   losartan 50 MG tablet Commonly known as: COZAAR Take 1 tablet (50 mg total) by mouth daily. Start taking on: April 30, 2020   Melatonin 5 MG Chew Chew 5 mg by mouth at bedtime.   multivitamin with minerals tablet Take 1 tablet by mouth daily.   pantoprazole 40 MG tablet Commonly known as: PROTONIX Take 1 tablet (40 mg total) by mouth daily.   PRESCRIPTION MEDICATION Inhale into the lungs at bedtime. CPAP   QUNOL ULTRA COQ10 PO Take 1 capsule by mouth daily.   rosuvastatin 20 MG tablet Commonly known as: CRESTOR Take 1 tablet (20 mg total) by mouth daily. What changed: when to take this   TURMERIC PO Take 1 capsule by mouth daily.   umeclidinium-vilanterol 62.5-25 MCG/INH Aepb Commonly known as: ANORO ELLIPTA Inhale 1 puff into the lungs daily. Start taking on:  April 30, 2020   Vitamin D-3 25 MCG (1000 UT) Caps Take 1,000 Units by mouth daily.       Follow-up Information    PFT Follow up on 05/22/2020.   Why: Appt at 12:00 for pulmonary function testing Contact information: [place], [place] [address]        Freddi Starr, MD Follow up on 05/26/2020.   Specialty: Pulmonary Disease Why: Appt at 9:30 AM.  Please arrive at 9:15 for check in - hospital follow up & review of PFT's Contact information: [address] [place] [address] [phone]        Maurice Small, MD. Schedule an appointment as soon as possible for a visit in 1 week(s).   Specialty: Family Medicine Why: for regular followup Contact information: [place] [address] [phone]        Revankar, [name], MD .   Specialty: Cardiology Contact information: [place] [address] [phone]                Time coordinating discharge: 39 minutes  Signed:  Berley Gambrell  [facility] 04/29/2020, 11:11 AM

## 2020-04-29 NOTE — Progress Notes (Signed)
Pt ambulated in the hallway with steady gait and no difficulties noted. Oxygen Saturations on Room Air maintained 95%.

## 2020-04-29 NOTE — Progress Notes (Signed)
Pt to be discharged to home this afternoon. Discharge instructions reviewed with Pt including all Medications and schedules for these Medications. Pt verbalized understanding of all discharge instructions. Discharge Packet with Pt at time of discharge

## 2020-04-29 NOTE — Plan of Care (Signed)

## 2020-04-30 ENCOUNTER — Telehealth: Payer: Self-pay | Admitting: Pulmonary Disease

## 2020-04-30 DIAGNOSIS — G4733 Obstructive sleep apnea (adult) (pediatric): Secondary | ICD-10-CM

## 2020-04-30 LAB — CULTURE, BLOOD (ROUTINE X 2)
Culture: NO GROWTH
Culture: NO GROWTH
Special Requests: ADEQUATE
Special Requests: ADEQUATE

## 2020-04-30 NOTE — Telephone Encounter (Signed)
Yes, that is ok to change to an in-lab sleep study. We can then make it a split night study, where if she is diagnosed with sleep apnea we can go ahead and do a cpap titration study as well.   She can be scheduled earlier for follow up as well.  Thanks, Wille Glaser

## 2020-04-30 NOTE — Telephone Encounter (Signed)
I called and spoke with the patient, she has decided that she would like to have her sleep study done in the lab instead of at home d/t being in the hospital recently.  She is also requesting an appointment to see you in the office for her hospital f/u sooner than December 2nd.  I advised her that I would have to check with Dr. Erin Fulling as there are holds on the schedule and putting patients in those slots require the ok from the physician.  She verbalized understanding.  I let her know that I would let her know when we hear back from him and advise her on his response.    Dr. Erin Fulling, Please advise if ok to change sleep study to in lab from a home sleep study.  Additionally, is it ok to use a hold slot to schedule this patient for a sooner hospital f/u?  Thank you.

## 2020-04-30 NOTE — Telephone Encounter (Signed)
Called and spoke with patient, scheduled her a hospital f/u visit for 11/23 at 10 am with Dr. Erin Fulling, he ok'd scheduling her earlier than December.  Order placed for split night sleep study in lab.  Patient scheduled for PFT and f/u in December.  Patient verbalized understanding.  Nothing further needed.

## 2020-05-02 ENCOUNTER — Telehealth: Payer: Self-pay | Admitting: Cardiology

## 2020-05-02 NOTE — Telephone Encounter (Signed)
Staff message sent to scheduling to arrange follow up

## 2020-05-02 NOTE — Telephone Encounter (Signed)
Pt stated she was just discharged from the hosp on 11/10 .  She stated she needs to fu with Dr Geraldo Pitter in 7 to 10 days .  She only want to see him in [place].  She would like to know if there is way to work her in with him only   Best number [phone]

## 2020-05-07 ENCOUNTER — Ambulatory Visit (HOSPITAL_BASED_OUTPATIENT_CLINIC_OR_DEPARTMENT_OTHER): Payer: Medicare Other | Admitting: Pulmonary Disease

## 2020-05-07 ENCOUNTER — Encounter (HOSPITAL_COMMUNITY): Payer: Self-pay

## 2020-05-07 ENCOUNTER — Other Ambulatory Visit: Payer: Self-pay

## 2020-05-07 ENCOUNTER — Emergency Department (HOSPITAL_COMMUNITY)
Admission: EM | Admit: 2020-05-07 | Discharge: 2020-05-07 | Disposition: A | Discharge status: Home / Self Care | Payer: Medicare Other | Attending: Emergency Medicine | Admitting: Emergency Medicine

## 2020-05-07 ENCOUNTER — Ambulatory Visit (HOSPITAL_COMMUNITY)
Admission: EM | Admit: 2020-05-07 | Discharge: 2020-05-07 | Discharge status: Against Medical Advice | Payer: Medicare Other

## 2020-05-07 ENCOUNTER — Emergency Department (HOSPITAL_COMMUNITY): Payer: Medicare Other

## 2020-05-07 DIAGNOSIS — N3000 Acute cystitis without hematuria: Secondary | ICD-10-CM | POA: Diagnosis not present

## 2020-05-07 DIAGNOSIS — Z79899 Other long term (current) drug therapy: Secondary | ICD-10-CM | POA: Insufficient documentation

## 2020-05-07 DIAGNOSIS — I10 Essential (primary) hypertension: Secondary | ICD-10-CM | POA: Insufficient documentation

## 2020-05-07 DIAGNOSIS — R3915 Urgency of urination: Secondary | ICD-10-CM | POA: Diagnosis present

## 2020-05-07 LAB — COMPREHENSIVE METABOLIC PANEL
ALT: 18 U/L (ref 0–44)
AST: 21 U/L (ref 15–41)
Albumin: 4.2 g/dL (ref 3.5–5.0)
Alkaline Phosphatase: 62 U/L (ref 38–126)
Anion gap: 10 (ref 5–15)
BUN: 12 mg/dL (ref 8–23)
CO2: 26 mmol/L (ref 22–32)
Calcium: 9.2 mg/dL (ref 8.9–10.3)
Chloride: 102 mmol/L (ref 98–111)
Creatinine, Ser: 0.89 mg/dL (ref 0.44–1.00)
GFR, Estimated: >60 mL/min (ref >60)
Glucose, Bld: 118 mg/dL — ABNORMAL HIGH (ref 70–99)
Potassium: 3.6 mmol/L (ref 3.5–5.1)
Sodium: 138 mmol/L (ref 135–145)
Total Bilirubin: 0.6 mg/dL (ref 0.3–1.2)
Total Protein: 7.3 g/dL (ref 6.5–8.1)

## 2020-05-07 LAB — CBC WITH DIFFERENTIAL/PLATELET
Abs Immature Granulocytes: 0.04 10*3/uL (ref 0.00–0.07)
Basophils Absolute: 0.1 10*3/uL (ref 0.0–0.1)
Basophils Relative: 1 %
Eosinophils Absolute: 0.4 10*3/uL (ref 0.0–0.5)
Eosinophils Relative: 4 %
HCT: 41.4 % (ref 36.0–46.0)
Hemoglobin: 13.2 g/dL (ref 12.0–15.0)
Immature Granulocytes: 0 %
Lymphocytes Relative: 14 %
Lymphs Abs: 1.5 10*3/uL (ref 0.7–4.0)
MCH: 29.5 pg (ref 26.0–34.0)
MCHC: 31.9 g/dL (ref 30.0–36.0)
MCV: 92.6 fL (ref 80.0–100.0)
Monocytes Absolute: 0.6 10*3/uL (ref 0.1–1.0)
Monocytes Relative: 6 %
Neutro Abs: 8.6 10*3/uL — ABNORMAL HIGH (ref 1.7–7.7)
Neutrophils Relative %: 75 %
Platelets: 291 10*3/uL (ref 150–400)
RBC: 4.47 MIL/uL (ref 3.87–5.11)
RDW: 14.1 % (ref 11.5–15.5)
WBC: 11.3 10*3/uL — ABNORMAL HIGH (ref 4.0–10.5)
nRBC: 0 % (ref 0.0–0.2)

## 2020-05-07 LAB — URINALYSIS, ROUTINE W REFLEX MICROSCOPIC
Bilirubin Urine: NEGATIVE
Glucose, UA: NEGATIVE mg/dL
Ketones, ur: NEGATIVE mg/dL
Nitrite: NEGATIVE
Protein, ur: 100 mg/dL — AB
Specific Gravity, Urine: 1.008 (ref 1.005–1.030)
WBC, UA: >50 WBC/hpf — ABNORMAL HIGH (ref 0–5)
pH: 6 (ref 5.0–8.0)

## 2020-05-07 MED ORDER — IOHEXOL 300 MG/ML  SOLN
100.0000 mL | Freq: Once | INTRAMUSCULAR | Status: AC | PRN
  Administered 2020-05-07: 100 mL via INTRAVENOUS

## 2020-05-07 MED ORDER — AMOXICILLIN-POT CLAVULANATE 875-125 MG PO TABS: 1.0000 | ORAL_TABLET | Freq: Two times a day (BID) | ORAL | 0 refills | Status: DC

## 2020-05-07 MED ORDER — AMOXICILLIN-POT CLAVULANATE 875-125 MG PO TABS
1.0000 | ORAL_TABLET | Freq: Once | ORAL | Status: AC
  Administered 2020-05-07: 1 via ORAL
  Filled 2020-05-07: qty 1

## 2020-05-07 NOTE — ED Provider Notes (Signed)
[facility] DEPT Provider Note   CSN: [id number] Arrival date & time: 05/07/20  1929     History No chief complaint on file.   Brittany Middleton is a 69 y.o. female.  HPI She is here for evaluation of urinary urgency and pain with voiding which has been present for about 1 day.  Her PCP called in a prescription for Bactrim, she has taken 2 doses, so far.  She denies fever, nausea, vomiting, weakness or dizziness.  She was discharged from the hospital about a week ago after treatment for urosepsis.  She was due to have a sleep study done tonight to evaluate her pulmonary function.  She canceled the appointment and came here instead.  There are no other known modifying factors.    Past Medical History:  Diagnosis Date  . Acute blood loss anemia 07/17/2018  . Anemia 07/17/2018  . Ascending aortic aneurysm ([place]) 05/23/2019  . Cervical disc disease    MRI 2016  . Chest pain with moderate risk for cardiac etiology 02/27/2018  . 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 Gastrointestinal bleeding 07/25/2018  . Gastrointestinal hemorrhage with melena 07/16/2018  . Hepatic steatosis    per imaging 2016, 2019  . Hiatal hernia   . History of palpitations    Holter monitor, 2017: NSR, occasional PVC, PACs, no arrhythmia  . HLD (hyperlipidemia) 02/27/2018  . HTN (hypertension) 02/27/2018  . Hypercholesteremia   . Hypercholesterolemia 04/07/2017  . Hypertension   . Hypertensive disorder 04/07/2017  . Hypokalemia 07/17/2018  . Lumbar disc disease    MRI, 2017   . Morbid obesity ([place]) 05/22/2018  . Overweight 03/03/2018  . Palpitations 08/19/2015  . Pyelonephritis 03/30/2020  . UTI (urinary tract infection)     Patient Active Problem List   Diagnosis Date Noted  . Severe sepsis with acute organ dysfunction ([place]) 04/25/2020  . Sepsis secondary to UTI ([place]) 04/25/2020  . Cervical disc disease   . Hepatic steatosis   . History of palpitations   . Hypercholesteremia   .  Hypertension   . Lumbar disc disease   . UTI (urinary tract infection)   . Pyelonephritis 03/30/2020  . Ascending aortic aneurysm ([place]) 05/23/2019  . Gastrointestinal bleeding 07/25/2018  . Hiatal hernia   . Anemia 07/17/2018  . Acute blood loss anemia 07/17/2018  . Hypokalemia 07/17/2018  . Gastrointestinal hemorrhage with melena 07/16/2018  . Morbid obesity ([place]) 05/22/2018  . Overweight 03/03/2018  . 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 Chest pain with moderate risk for cardiac etiology 02/27/2018  . HTN (hypertension) 02/27/2018  . HLD (hyperlipidemia) 02/27/2018  . Hypercholesterolemia 04/07/2017  . Hypertensive disorder 04/07/2017  . Palpitations 08/19/2015    Past Surgical History:  Procedure Laterality Date  . BLADDER REPAIR    . BREAST EXCISIONAL BIOPSY Left   . CARPAL TUNNEL RELEASE Right   . CATARACT EXTRACTION, BILATERAL  2014  . COLONOSCOPY WITH PROPOFOL N/A 07/18/2018   Procedure: COLONOSCOPY WITH PROPOFOL;  Surgeon: Laurence Spates, MD;  Location: [facility];  Service: Endoscopy;  Laterality: N/A;  . ESOPHAGOGASTRODUODENOSCOPY (EGD) WITH PROPOFOL N/A 07/17/2018   Procedure: ESOPHAGOGASTRODUODENOSCOPY (EGD) WITH PROPOFOL;  Surgeon: Laurence Spates, MD;  Location: [facility];  Service: Endoscopy;  Laterality: N/A;  . REPAIR RECTOCELE       OB History   No obstetric history on file.     Family History  Problem Relation Age of Onset  . CAD Mother   . Breast cancer Mother 44    Social  History   Tobacco Use  . Smoking status: Never Smoker  . Smokeless tobacco: Never Used  Vaping Use  . Vaping Use: Never used  Substance Use Topics  . Alcohol use: No  . Drug use: No    Home Medications Prior to Admission medications   Medication Sig Start Date End Date Taking? Authorizing Provider  acetaminophen (TYLENOL) 500 MG tablet Take 1,000 mg by mouth every 6 (six) hours as needed for mild pain, moderate pain, fever or headache.   Yes [provider]   albuterol (PROVENTIL) (2.5 MG/3ML) 0.083% nebulizer solution Take 2.5 mg by nebulization every 6 (six) hours as needed for wheezing or shortness of breath.   Yes [provider]  albuterol (VENTOLIN HFA) 108 (90 Base) MCG/ACT inhaler Inhale 2 puffs into the lungs every 6 (six) hours as needed for wheezing or shortness of breath. 04/01/20  Yes Regalado, Belkys A, MD  BIOTIN PO Take 1 tablet by mouth daily.   Yes [provider]  Cholecalciferol (VITAMIN D-3) 25 MCG (1000 UT) CAPS Take 1,000 Units by mouth daily.   Yes [provider]  clopidogrel (PLAVIX) 75 MG tablet Take 1 tablet (75 mg total) by mouth daily. Patient taking differently: Take 75 mg by mouth daily with supper.  06/28/19  Yes Revankar, [name], MD  Coenzyme Q10-Vitamin E (QUNOL ULTRA COQ10 PO) Take 1 capsule by mouth daily.   Yes [provider]  fluticasone (FLONASE) 50 MCG/ACT nasal spray Place 1 spray into both nostrils daily. Patient taking differently: Place 1 spray into both nostrils daily as needed for allergies or rhinitis.  04/01/20  Yes Regalado, Belkys A, MD  hydrochlorothiazide (HYDRODIURIL) 25 MG tablet Take 1 tablet (25 mg total) by mouth daily. 04/29/20  Yes Pokhrel, Laxman, MD  hydrocortisone 2.5 % cream Apply 1 application topically daily as needed (forehead).  04/13/20  Yes [provider]  IRON PO Take 1 tablet by mouth 2 (two) times a week.    Yes [provider]  losartan (COZAAR) 50 MG tablet Take 1 tablet (50 mg total) by mouth daily. 04/30/20  Yes Pokhrel, Laxman, MD  Melatonin 5 MG CHEW Chew 5 mg by mouth at bedtime.    Yes [provider]  Multiple Vitamins-Minerals (MULTIVITAMIN WITH MINERALS) tablet Take 1 tablet by mouth daily.   Yes [provider]  pantoprazole (PROTONIX) 40 MG tablet Take 1 tablet (40 mg total) by mouth daily. 04/01/20  Yes Regalado, Belkys A, MD  PRESCRIPTION MEDICATION Inhale into the lungs at bedtime. CPAP   Yes  [provider]  rosuvastatin (CRESTOR) 20 MG tablet Take 1 tablet (20 mg total) by mouth daily. Patient taking differently: Take 20 mg by mouth daily after supper.  12/25/19 05/20/20 Yes Revankar, [name], MD  sulfamethoxazole-trimethoprim (BACTRIM DS) 800-160 MG tablet Take 1 tablet by mouth 2 (two) times daily. 05/06/20  Yes [provider]  TURMERIC PO Take 1 capsule by mouth daily.   Yes [provider]  amoxicillin-clavulanate (AUGMENTIN) 875-125 MG tablet Take 1 tablet by mouth 2 (two) times daily. One po bid x 7 days 05/07/20   Daleen Bo, MD  fluticasone [facility] HFA) 110 MCG/ACT inhaler Inhale 2 puffs into the lungs 2 (two) times daily. 04/29/20 04/29/21  Pokhrel, Corrie Mckusick, MD  guaiFENesin (ROBITUSSIN) 100 MG/5ML SOLN Take 5 mLs (100 mg total) by mouth every 4 (four) hours as needed for cough or to loosen phlegm. 04/29/20   Pokhrel, Corrie Mckusick, MD  umeclidinium-vilanterol [facility]  ELLIPTA) 62.5-25 MCG/INH AEPB Inhale 1 puff into the lungs daily. Patient not taking: Reported on 05/07/2020 04/30/20   Flora Lipps, MD    Allergies    Ciprofloxacin and Codeine  Review of Systems   Review of Systems  All other systems reviewed and are negative.   Physical Exam Updated Vital Signs BP (!) 175/80   Pulse 76   Temp 98.3 F (36.8 C) (Oral)   Resp 17   Ht 5\' 5"  (1.651 m)   Wt 118.8 kg   SpO2 95%   BMI 43.60 kg/m   Physical Exam Vitals and nursing note reviewed.  Constitutional:      General: She is not in acute distress.    Appearance: She is well-developed. She is not ill-appearing, toxic-appearing or diaphoretic.  HENT:     Head: Normocephalic and atraumatic.     Right Ear: External ear normal.     Left Ear: External ear normal.  Eyes:     Conjunctiva/sclera: Conjunctivae normal.     Pupils: Pupils are equal, round, and reactive to light.  Neck:     Trachea: Phonation normal.  Cardiovascular:     Rate and Rhythm: Normal rate and regular rhythm.      Heart sounds: Normal heart sounds.  Pulmonary:     Effort: Pulmonary effort is normal.     Breath sounds: Normal breath sounds.  Abdominal:     General: There is no distension.     Palpations: Abdomen is soft.     Tenderness: There is no abdominal tenderness.  Musculoskeletal:        General: Normal range of motion.     Cervical back: Normal range of motion and neck supple.  Skin:    General: Skin is warm and dry.     Coloration: Skin is not jaundiced or pale.  Neurological:     Mental Status: She is alert and oriented to person, place, and time.     Cranial Nerves: No cranial nerve deficit.     Sensory: No sensory deficit.     Motor: No abnormal muscle tone.     Coordination: Coordination normal.  Psychiatric:        Mood and Affect: Mood normal.        Behavior: Behavior normal.        Thought Content: Thought content normal.        Judgment: Judgment normal.     ED Results / Procedures / Treatments   Labs (all labs ordered are listed, but only abnormal results are displayed) Labs Reviewed  COMPREHENSIVE METABOLIC PANEL - Abnormal; Notable for the following components:      Result Value   Glucose, Bld 118 (*)    All other components within normal limits  CBC WITH DIFFERENTIAL/PLATELET - Abnormal; Notable for the following components:   WBC 11.3 (*)    Neutro Abs 8.6 (*)    All other components within normal limits  URINALYSIS, ROUTINE W REFLEX MICROSCOPIC - Abnormal; Notable for the following components:   APPearance CLOUDY (*)    Hgb urine dipstick MODERATE (*)    Protein, ur 100 (*)    Leukocytes,Ua LARGE (*)    WBC, UA >50 (*)    Bacteria, UA RARE (*)    All other components within normal limits  URINE CULTURE    EKG None  Radiology No results found.  Procedures Procedures (including critical care time)  Medications Ordered in ED Medications  amoxicillin-clavulanate (AUGMENTIN) 875-125 MG per tablet 1  tablet (1 tablet Oral Given 05/07/20 2210)   iohexol (OMNIPAQUE) 300 MG/ML solution 100 mL (100 mLs Intravenous Contrast Given 05/07/20 2217)    ED Course  I have reviewed the triage vital signs and the nursing notes.  Pertinent labs & imaging results that were available during my care of the patient were reviewed by me and considered in my medical decision making (see chart for details).    MDM Rules/Calculators/A&P                           Patient Vitals for the past 24 hrs:  BP Temp Temp src Pulse Resp SpO2 Height Weight  05/07/20 2245 (!) 175/80 -- -- 76 17 95 % -- --  05/07/20 2210 (!) 154/72 -- -- 75 17 95 % -- --  05/07/20 1941 (!) 166/74 98.3 F (36.8 C) Oral 75 16 95 % 5\' 5"  (1.651 m) 118.8 kg    At time of discharge-reevaluation with update and discussion. After initial assessment and treatment, an updated evaluation reveals she remains comfortable has no further complaints.Daleen Bo   Medical Decision Making:  This patient is presenting for evaluation of urinary symptoms consistent with cystitis, which does require a range of treatment options, and is a complaint that involves a moderate risk of morbidity and mortality. The differential diagnoses include UTI, sepsis, nonspecific painful urination. I decided to review old records, and in summary she is elderly female presenting with signs symptoms of cystitis.  I did not require additional historical information from anyone.  Clinical Laboratory Tests Ordered, included CBC, Metabolic panel and Urinalysis. Review indicates normal metabolic panel, white count elevated, urinalysis abnormal consistent with infection. Radiologic Tests Ordered, included CT abdomen pelvis.  I independently Visualized: Radiographic images, which shows findings consistent with cystitis    Critical Interventions-clinical evaluation, laboratory testing, CT imaging, observation reassessment.  Empiric antibiotics started.  After These Interventions, the Patient was reevaluated and was  found with likely cystitis, recurrent UTI.  Prior urine culture negative, with similar symptoms.  Unlikely to represent early sepsis or significant metabolic disorder.  Patient has a history of rectocele and cystocele, which may be complicating recovery for her.  She is referred to urology and gynecology for ongoing management.   Critical care: No  Performed by: Daleen Bo  Nursing Notes Reviewed/ Care Coordinated Applicable Imaging Reviewed Interpretation of Laboratory Data incorporated into ED treatment  The patient appears reasonably screened and/or stabilized for discharge and I doubt any other medical condition or other [facility] requiring further screening, evaluation, or treatment in the ED at this time prior to discharge.  Plan: Home Medications-continue usual; Home Treatments-rest, gradual advance activity; return here if the recommended treatment, does not improve the symptoms; Recommended follow up-PCP, as needed.  Urology follow-up for recurrent UTI, consider seeing GYN for evaluation of pelvic floor disorder.     Final Clinical Impression(s) / ED Diagnoses Final diagnoses:  Acute cystitis without hematuria    Rx / DC Orders ED Discharge Orders         Ordered    amoxicillin-clavulanate (AUGMENTIN) 875-125 MG tablet  2 times daily        05/07/20 2309           Daleen Bo, MD 05/08/20 1153

## 2020-05-07 NOTE — Discharge Instructions (Addendum)
Drink plenty of fluids. Start the antibiotic prescription tomorrow.

## 2020-05-07 NOTE — ED Triage Notes (Signed)
Pt complains of UTI, pressure, urgency and pain Pt was just released from the hospital for the second time a week ago for urosepsis Her primary gave her a prescription for bactrim yesterday and she's not feeling any better Pt states that she seems to get fevers at night

## 2020-05-07 NOTE — ED Notes (Signed)
Patient transported to CT 

## 2020-05-08 ENCOUNTER — Ambulatory Visit (INDEPENDENT_AMBULATORY_CARE_PROVIDER_SITE_OTHER): Payer: Medicare Other | Admitting: Pulmonary Disease

## 2020-05-08 ENCOUNTER — Telehealth: Payer: Self-pay | Admitting: Pulmonary Disease

## 2020-05-08 DIAGNOSIS — R0609 Other forms of dyspnea: Secondary | ICD-10-CM

## 2020-05-08 DIAGNOSIS — R0600 Dyspnea, unspecified: Secondary | ICD-10-CM | POA: Diagnosis not present

## 2020-05-08 LAB — PULMONARY FUNCTION TEST
DL/VA % pred: 150 %
DL/VA: 6.22 ml/min/mmHg/L
DLCO cor % pred: 140 %
DLCO cor: 28.51 ml/min/mmHg
DLCO unc % pred: 135 %
DLCO unc: 27.6 ml/min/mmHg
FEF 25-75 Post: 3.13 L/sec
FEF 25-75 Pre: 2.03 L/sec
FEF2575-%Change-Post: 54 %
FEF2575-%Pred-Post: 149 %
FEF2575-%Pred-Pre: 96 %
FEV1-%Change-Post: 9 %
FEV1-%Pred-Post: 95 %
FEV1-%Pred-Pre: 86 %
FEV1-Post: 2.33 L
FEV1-Pre: 2.13 L
FEV1FVC-%Change-Post: 5 %
FEV1FVC-%Pred-Pre: 105 %
FEV6-%Change-Post: 4 %
FEV6-%Pred-Post: 88 %
FEV6-%Pred-Pre: 84 %
FEV6-Post: 2.74 L
FEV6-Pre: 2.62 L
FEV6FVC-%Pred-Post: 104 %
FEV6FVC-%Pred-Pre: 104 %
FVC-%Change-Post: 4 %
FVC-%Pred-Post: 85 %
FVC-%Pred-Pre: 81 %
FVC-Post: 2.74 L
FVC-Pre: 2.63 L
Post FEV1/FVC ratio: 85 %
Post FEV6/FVC ratio: 100 %
Pre FEV1/FVC ratio: 81 %
Pre FEV6/FVC Ratio: 100 %
RV % pred: 73 %
RV: 1.61 L
TLC % pred: 91 %
TLC: 4.74 L

## 2020-05-08 NOTE — Progress Notes (Signed)
Full PFT performed today. °

## 2020-05-08 NOTE — Telephone Encounter (Signed)
Called pt & left the sleep center's phone number to call to r/s her appt [phone]).

## 2020-05-09 LAB — URINE CULTURE: Culture: 60000 — AB

## 2020-05-11 ENCOUNTER — Telehealth: Payer: Self-pay | Admitting: Emergency Medicine

## 2020-05-11 NOTE — Telephone Encounter (Signed)
Post ED Visit - Positive Culture Follow-up: Successful Patient Follow-Up  Culture assessed and recommendations reviewed by:  []  Elenor Quinones, Pharm.D. []  Heide Guile, Pharm.D., BCPS AQ-ID []  Parks Neptune, Pharm.D., BCPS []  Alycia Rossetti, Pharm.D., BCPS []  [place], Pharm.D., BCPS, AAHIVP []  Legrand Como, Pharm.D., BCPS, AAHIVP []  Salome Arnt, PharmD, BCPS []  Johnnette Gourd, PharmD, BCPS []  Hughes Better, PharmD, BCPS [x]  Graylin Shiver, PharmD  Positive urine culture  []  Patient discharged without antimicrobial prescription and treatment is now indicated [x]  Organism is resistant to prescribed ED discharge antimicrobial []  Patient with positive blood cultures  Changes discussed with ED provider: Shelby Dubin PA New antibiotic prescription: Fosfomycin 3 gram q 3 days x 2 doses Called to [facility] [phone]  Contacted patient, date 05/11/20, time [place] 05/11/2020, 5:12 PM

## 2020-05-11 NOTE — Progress Notes (Signed)
ED Antimicrobial Stewardship Positive Culture Follow Up   Brittany Middleton is an 69 y.o. female who presented to [facility] on 05/07/2020 with a chief complaint of No chief complaint on file.   Recent Results (from the past 720 hour(s))  Blood Culture (routine x 2)     Status: None   Collection Time: 04/25/20 11:18 AM   Specimen: BLOOD LEFT ARM  Result Value Ref Range Status   Specimen Description   Final    BLOOD LEFT ARM Performed at [facility], [address] [place], [place] [address]    Special Requests   Final    BOTTLES DRAWN AEROBIC AND ANAEROBIC Blood Culture adequate volume Performed at [facility], [address] [place], [place] [address]    Culture   Final    NO GROWTH 5 DAYS Performed at [facility], [place] [address] [place], [place] [address]    Report Status 04/30/2020 FINAL  Final  Blood Culture (routine x 2)     Status: None   Collection Time: 04/25/20 11:22 AM   Specimen: BLOOD RIGHT ARM  Result Value Ref Range Status   Specimen Description   Final    BLOOD RIGHT ARM Performed at [facility], [address] [place], [place] [address]    Special Requests   Final    BOTTLES DRAWN AEROBIC AND ANAEROBIC Blood Culture adequate volume Performed at [facility], [address], [place], [place] [address]    Culture   Final    NO GROWTH 5 DAYS Performed at [facility], [place] [address], [place], [place] [address]    Report Status 04/30/2020 FINAL  Final  Respiratory Panel by RT PCR (Flu A&B, Covid) - Peripheral     Status: None   Collection Time: 04/25/20 11:24 AM   Specimen: Peripheral; Nasopharyngeal  Result Value Ref Range Status   SARS Coronavirus 2 by RT PCR NEGATIVE NEGATIVE Final    Comment: (NOTE) SARS-CoV-2 target nucleic acids are NOT DETECTED.  The SARS-CoV-2 RNA is generally detectable in upper respiratoy specimens during the acute phase of infection. The lowest concentration of  SARS-CoV-2 viral copies this assay can detect is 131 copies/mL. A negative result does not preclude SARS-Cov-2 infection and should not be used as the sole basis for treatment or other patient management decisions. A negative result may occur with  improper specimen collection/handling, submission of specimen other than nasopharyngeal swab, presence of viral mutation(s) within the areas targeted by this assay, and inadequate number of viral copies (<131 copies/mL). A negative result must be combined with clinical observations, patient history, and epidemiological information. The expected result is Negative.  Fact Sheet for Patients:  PinkCheek.be  Fact Sheet for Healthcare Providers:  GravelBags.it  This test is no t yet approved or cleared by the [place] FDA and  has been authorized for detection and/or diagnosis of SARS-CoV-2 by FDA under an Emergency Use Authorization (EUA). This EUA will remain  in effect (meaning this test can be used) for the duration of the COVID-19 declaration under Section 564(b)(1) of the Act, 21 U.S.C. section 360bbb-3(b)(1), unless the authorization is terminated or revoked sooner.     Influenza A by PCR NEGATIVE NEGATIVE Final   Influenza B by PCR NEGATIVE NEGATIVE Final    Comment: (NOTE) The Xpert Xpress SARS-CoV-2/FLU/RSV assay is intended as an aid in  the diagnosis of influenza from Nasopharyngeal swab specimens  and  should not be used as a sole basis for treatment. Nasal washings and  aspirates are unacceptable for Xpert Xpress SARS-CoV-2/FLU/RSV  testing.  Fact Sheet for Patients: PinkCheek.be  Fact Sheet for Healthcare Providers: GravelBags.it  This test is not yet approved or cleared by the [place] FDA and  has been authorized for detection and/or diagnosis of SARS-CoV-2 by  FDA under an Emergency Use  Authorization (EUA). This EUA will remain  in effect (meaning this test can be used) for the duration of the  Covid-19 declaration under Section 564(b)(1) of the Act, 21  U.S.C. section 360bbb-3(b)(1), unless the authorization is  terminated or revoked. Performed at [facility], [address] [place], [place] [address]   Urine culture     Status: None   Collection Time: 04/25/20  1:32 PM   Specimen: In/Out Cath Urine  Result Value Ref Range Status   Specimen Description   Final    IN/OUT CATH URINE Performed at [facility], [address] [place], [place] [address]    Special Requests   Final    NONE Performed at [facility], [address] [place], [place] [address]    Culture   Final    NO GROWTH Performed at [facility], [place] [address] [place], [place] [address]    Report Status 04/27/2020 FINAL  Final  Respiratory Panel by PCR     Status: None   Collection Time: 04/28/20  3:13 PM   Specimen: Nasopharyngeal Swab; Respiratory  Result Value Ref Range Status   Adenovirus NOT DETECTED NOT DETECTED Final   Coronavirus 229E NOT DETECTED NOT DETECTED Final    Comment: (NOTE) The Coronavirus on the Respiratory Panel, DOES NOT test for the novel  Coronavirus (2019 nCoV)    Coronavirus HKU1 NOT DETECTED NOT DETECTED Final   Coronavirus NL63 NOT DETECTED NOT DETECTED Final   Coronavirus OC43 NOT DETECTED NOT DETECTED Final   Metapneumovirus NOT DETECTED NOT DETECTED Final   Rhinovirus / Enterovirus NOT DETECTED NOT DETECTED Final   Influenza A NOT DETECTED NOT DETECTED Final   Influenza B NOT DETECTED NOT DETECTED Final   Parainfluenza Virus 1 NOT DETECTED NOT DETECTED Final   Parainfluenza Virus 2 NOT DETECTED NOT DETECTED Final   Parainfluenza Virus 3 NOT DETECTED NOT DETECTED Final   Parainfluenza Virus 4 NOT DETECTED NOT DETECTED Final   Respiratory Syncytial Virus NOT DETECTED NOT DETECTED Final   Bordetella pertussis NOT  DETECTED NOT DETECTED Final   Chlamydophila pneumoniae NOT DETECTED NOT DETECTED Final   Mycoplasma pneumoniae NOT DETECTED NOT DETECTED Final    Comment: Performed at [facility], [place] [address], [place], [place] [address]  Urine culture     Status: Abnormal   Collection Time: 05/07/20  8:35 PM   Specimen: Urine, Clean Catch  Result Value Ref Range Status   Specimen Description   Final    URINE, CLEAN CATCH Performed at [facility], [place] [address], [place], [place] [address]    Special Requests   Final    NONE Performed at [facility], [place] [address], [place],  [address]    Culture (A)  Final    60,000 COLONIES/mL ESCHERICHIA COLI Confirmed Extended Spectrum Beta-Lactamase Producer (ESBL).  In bloodstream infections from ESBL organisms, carbapenems are preferred over piperacillin/tazobactam. They are shown to have a lower risk of mortality.    Report Status 05/09/2020  FINAL  Final   Organism ID, Bacteria ESCHERICHIA COLI (A)  Final      Susceptibility   Escherichia coli - MIC*    AMPICILLIN >=32 RESISTANT Resistant     CEFAZOLIN >=64 RESISTANT Resistant     CEFEPIME 16 RESISTANT Resistant     CEFTRIAXONE >=64 RESISTANT Resistant     CIPROFLOXACIN >=4 RESISTANT Resistant     GENTAMICIN >=16 RESISTANT Resistant     IMIPENEM <=0.25 SENSITIVE Sensitive     NITROFURANTOIN <=16 SENSITIVE Sensitive     TRIMETH/SULFA >=320 RESISTANT Resistant     AMPICILLIN/SULBACTAM >=32 RESISTANT Resistant     PIP/TAZO 8 SENSITIVE Sensitive     * 60,000 COLONIES/mL ESCHERICHIA COLI    [x]  Treated with Bactrim prior to ED visit, discharged on Augmentin , organism resistant to prescribed antimicrobial. E coli ESBL producer  []  Patient discharged originally without antimicrobial agent and treatment is now indicated  Symptom check: if no new sx - treat with Fosfomycin 3gm q3 days x 2 doses.                              If exhibiting sx of sepsis  > return to the ED for hospital admission  New antibiotic prescription: Fosfomycin 3gm q3 days x 2 doses  ED Provider: Anselmo Pickler PA   Minda Ditto 05/11/2020, 8:35 AM Clinical Pharmacist [phone]

## 2020-05-13 ENCOUNTER — Other Ambulatory Visit: Payer: Self-pay

## 2020-05-13 ENCOUNTER — Encounter: Payer: Self-pay | Admitting: Pulmonary Disease

## 2020-05-13 ENCOUNTER — Ambulatory Visit (INDEPENDENT_AMBULATORY_CARE_PROVIDER_SITE_OTHER): Payer: Medicare Other | Admitting: Pulmonary Disease

## 2020-05-13 VITALS — BP 122/78 | HR 68 | Ht 65.0 in | Wt 264.0 lb

## 2020-05-13 DIAGNOSIS — G4733 Obstructive sleep apnea (adult) (pediatric): Secondary | ICD-10-CM | POA: Diagnosis not present

## 2020-05-13 DIAGNOSIS — J45909 Unspecified asthma, uncomplicated: Secondary | ICD-10-CM | POA: Diagnosis not present

## 2020-05-13 MED ORDER — FLUTICASONE PROPIONATE HFA 110 MCG/ACT IN AERO: 2.0000 | INHALATION_SPRAY | Freq: Two times a day (BID) | RESPIRATORY_TRACT | 6 refills | Status: DC

## 2020-05-13 NOTE — Patient Instructions (Signed)
Continue to use albuterol inhaler 1-2 puffs as needed every 4-6 hours  Start flovent 131mcg 2 puffs twice daily with spacer (rinse mouth out after)

## 2020-05-13 NOTE — Progress Notes (Signed)
Synopsis: Hospital follow up for dyspnea, cough and OSA.  Subjective:   PATIENT ID: Brittany Middleton GENDER: female DOB: 1950/12/01, MRN: [id number]  HPI  Chief Complaint  Patient presents with  . Hospitalization Follow-up    Brittany Middleton is a 69 year old woman, never smoker with history of obstructive sleep apnea on cpap, obesity, hypertension and haital hernia with GERD who comes to pulmonary clinic for hospital follow up after two recent admissions for urinary tract infections and involvement of cough and shortness of breath.  She has been doing well since her most recent admission 04/25/20 where she was treated for sepsis secondary to UTI. CT angiogram of the chest was negative for PE but with vascular congestion. Duplex ultrasound negative for lower extremity DVT. She again had trouble with cough and dyspnea and was treated with diuretic therapy as she had signs of volume overload with improvement in her breathing. The previous admisson 03/30/2020 she was treated with budesonide, brovana and yupleri nebs with improvement in her breathing.   She has flovent 113mcg and Anoro listed on her medication list but has not been taking these medications. The Anoro was too expensive and she did not feel she needed the flovent inhaler. She also reports she went home with the rest of her anoro inhaler from the hospital and did not note a major improvement in her breathing. She does use as needed albuterol with relief.   She had PFTs on 05/08/20 showing normal spirometry, total lung volumes and elevated DLCO.   She is finishing treatment for her most recent UTI with 2 doses of fosfomycin as her urine culture grew a resistant e.coli to augmentin. She has follow up with urology in the coming weeks.   Past Medical History:  Diagnosis Date  . Acute blood loss anemia 07/17/2018  . Anemia 07/17/2018  . Ascending aortic aneurysm ([place]) 05/23/2019  . Cervical disc disease    MRI 2016  . Chest pain  with moderate risk for cardiac etiology 02/27/2018  . 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 Gastrointestinal bleeding 07/25/2018  . Gastrointestinal hemorrhage with melena 07/16/2018  . Hepatic steatosis    per imaging 2016, 2019  . Hiatal hernia   . History of palpitations    Holter monitor, 2017: NSR, occasional PVC, PACs, no arrhythmia  . HLD (hyperlipidemia) 02/27/2018  . HTN (hypertension) 02/27/2018  . Hypercholesteremia   . Hypercholesterolemia 04/07/2017  . Hypertension   . Hypertensive disorder 04/07/2017  . Hypokalemia 07/17/2018  . Lumbar disc disease    MRI, 2017   . Morbid obesity ([place]) 05/22/2018  . Overweight 03/03/2018  . Palpitations 08/19/2015  . Pyelonephritis 03/30/2020  . UTI (urinary tract infection)      Family History  Problem Relation Age of Onset  . CAD Mother   . Breast cancer Mother 21     Social History   Socioeconomic History  . Marital status: Divorced    Spouse name: Not on file  . Number of children: 2  . Years of education: Not on file  . Highest education level: Master's degree (e.g., MA, MS, MEng, MEd, MSW, MBA)  Occupational History  . Occupation: Retired  Tobacco Use  . Smoking status: Never Smoker  . Smokeless tobacco: Never Used  Vaping Use  . Vaping Use: Never used  Substance and Sexual Activity  . Alcohol use: No  . Drug use: No  . Sexual activity: Not on file  Other Topics Concern  . Not on file  Social  History Narrative  . Not on file   Social Determinants of Health   Financial Resource Strain:   . Difficulty of Paying Living Expenses: Not on file  Food Insecurity:   . Worried About Charity fundraiser in the Last Year: Not on file  . Ran Out of Food in the Last Year: Not on file  Transportation Needs:   . Lack of Transportation (Medical): Not on file  . Lack of Transportation (Non-Medical): Not on file  Physical Activity:   . Days of Exercise per Week: Not on file  . Minutes of Exercise per Session: Not on file  Stress:   .  Feeling of Stress : Not on file  Social Connections:   . Frequency of Communication with Friends and Family: Not on file  . Frequency of Social Gatherings with Friends and Family: Not on file  . Attends Religious Services: Not on file  . Active Member of Clubs or Organizations: Not on file  . Attends Archivist Meetings: Not on file  . Marital Status: Not on file  Intimate Partner Violence:   . Fear of Current or Ex-Partner: Not on file  . Emotionally Abused: Not on file  . Physically Abused: Not on file  . Sexually Abused: Not on file     Allergies  Allergen Reactions  . Ciprofloxacin Other (See Comments)    Caused PAIN IN HANDS   . Codeine Nausea And Vomiting     Outpatient Medications Prior to Visit  Medication Sig Dispense Refill  . acetaminophen (TYLENOL) 500 MG tablet Take 1,000 mg by mouth every 6 (six) hours as needed for mild pain, moderate pain, fever or headache.    . albuterol (PROVENTIL) (2.5 MG/3ML) 0.083% nebulizer solution Take 2.5 mg by nebulization every 6 (six) hours as needed for wheezing or shortness of breath.    Marland Kitchen albuterol (VENTOLIN HFA) 108 (90 Base) MCG/ACT inhaler Inhale 2 puffs into the lungs every 6 (six) hours as needed for wheezing or shortness of breath. 8 g 2  . amoxicillin-clavulanate (AUGMENTIN) 875-125 MG tablet Take 1 tablet by mouth 2 (two) times daily. One po bid x 7 days 14 tablet 0  . BIOTIN PO Take 1 tablet by mouth daily.    . Cholecalciferol (VITAMIN D-3) 25 MCG (1000 UT) CAPS Take 1,000 Units by mouth daily.    . clopidogrel (PLAVIX) 75 MG tablet Take 1 tablet (75 mg total) by mouth daily. (Patient taking differently: Take 75 mg by mouth daily with supper. ) 90 tablet 3  . Coenzyme Q10-Vitamin E (QUNOL ULTRA COQ10 PO) Take 1 capsule by mouth daily.    . fluticasone (FLONASE) 50 MCG/ACT nasal spray Place 1 spray into both nostrils daily. (Patient taking differently: Place 1 spray into both nostrils daily as needed for allergies or  rhinitis. ) 1 g 2  . fosfomycin (MONUROL) 3 g PACK Take 3 g by mouth once.    Marland Kitchen guaiFENesin (ROBITUSSIN) 100 MG/5ML SOLN Take 5 mLs (100 mg total) by mouth every 4 (four) hours as needed for cough or to loosen phlegm. 236 mL 0  . hydrochlorothiazide (HYDRODIURIL) 25 MG tablet Take 1 tablet (25 mg total) by mouth daily. 30 tablet 2  . hydrocortisone 2.5 % cream Apply 1 application topically daily as needed (forehead).     . IRON PO Take 1 tablet by mouth 2 (two) times a week.     . losartan (COZAAR) 50 MG tablet Take 1 tablet (50 mg total)  by mouth daily. 30 tablet 2  . Melatonin 5 MG CHEW Chew 5 mg by mouth at bedtime.     . Multiple Vitamins-Minerals (MULTIVITAMIN WITH MINERALS) tablet Take 1 tablet by mouth daily.    . pantoprazole (PROTONIX) 40 MG tablet Take 1 tablet (40 mg total) by mouth daily. 30 tablet 1  . PRESCRIPTION MEDICATION Inhale into the lungs at bedtime. CPAP    . rosuvastatin (CRESTOR) 20 MG tablet Take 1 tablet (20 mg total) by mouth daily. (Patient taking differently: Take 20 mg by mouth daily after supper. ) 90 tablet 2  . TURMERIC PO Take 1 capsule by mouth daily.    . fluticasone (FLOVENT HFA) 110 MCG/ACT inhaler Inhale 2 puffs into the lungs 2 (two) times daily. 1 each 2  . umeclidinium-vilanterol (ANORO ELLIPTA) 62.5-25 MCG/INH AEPB Inhale 1 puff into the lungs daily. 1 each 2  . sulfamethoxazole-trimethoprim (BACTRIM DS) 800-160 MG tablet Take 1 tablet by mouth 2 (two) times daily.     No facility-administered medications prior to visit.    Review of Systems  Constitutional: Positive for malaise/fatigue. Negative for chills, fever and weight loss.  HENT: Negative for sinus pain and sore throat.   Eyes: Negative.   Respiratory: Positive for shortness of breath.   Cardiovascular: Negative for chest pain and leg swelling.  Gastrointestinal: Positive for heartburn. Negative for abdominal pain, nausea and vomiting.  Musculoskeletal: Negative.   Neurological: Negative  for dizziness, weakness and headaches.  Endo/Heme/Allergies: Negative.   Psychiatric/Behavioral: Negative.     Objective:   Vitals:   05/13/20 0957  BP: 122/78  Pulse: 68  SpO2: 98%  Weight: 264 lb (119.7 kg)  Height: 5\' 5"  (1.651 m)     Physical Exam Constitutional:      General: She is not in acute distress.    Appearance: She is obese. She is not ill-appearing.  HENT:     Head: Normocephalic and atraumatic.     Nose: Nose normal. No congestion.     Mouth/Throat:     Mouth: Mucous membranes are moist.     Pharynx: Oropharynx is clear.  Eyes:     General: No scleral icterus.    Conjunctiva/sclera: Conjunctivae normal.  Cardiovascular:     Rate and Rhythm: Normal rate and regular rhythm.     Pulses: Normal pulses.     Heart sounds: Normal heart sounds. No murmur heard.   Pulmonary:     Effort: Pulmonary effort is normal.     Breath sounds: Rales (faint, left base) present. No wheezing or rhonchi.  Abdominal:     General: Bowel sounds are normal.     Palpations: Abdomen is soft.  Musculoskeletal:     Cervical back: Neck supple.     Right lower leg: No edema.     Left lower leg: No edema.  Skin:    General: Skin is warm and dry.     Capillary Refill: Capillary refill takes less than 2 seconds.  Neurological:     General: No focal deficit present.     Mental Status: She is alert.     Gait: Gait normal.  Psychiatric:        Mood and Affect: Mood normal.        Behavior: Behavior normal.        Thought Content: Thought content normal.        Judgment: Judgment normal.     CBC    Component Value Date/Time   WBC 11.3 (H)  05/07/2020 2049   RBC 4.47 05/07/2020 2049   HGB 13.2 05/07/2020 2049   HGB 13.9 12/20/2019 0829   HCT 41.4 05/07/2020 2049   HCT 42.5 12/20/2019 0829   PLT 291 05/07/2020 2049   PLT 207 12/20/2019 0829   MCV 92.6 05/07/2020 2049   MCV 93 12/20/2019 0829   MCH 29.5 05/07/2020 2049   MCHC 31.9 05/07/2020 2049   RDW 14.1 05/07/2020  2049   RDW 13.5 12/20/2019 0829   LYMPHSABS 1.5 05/07/2020 2049   LYMPHSABS 1.3 12/20/2019 0829   MONOABS 0.6 05/07/2020 2049   EOSABS 0.4 05/07/2020 2049   EOSABS 0.2 12/20/2019 0829   BASOSABS 0.1 05/07/2020 2049   BASOSABS 0.0 12/20/2019 0829   BMP Latest Ref Rng & Units 05/07/2020 04/29/2020 04/28/2020  Glucose 70 - 99 mg/dL 118(H) 116(H) 115(H)  BUN 8 - 23 mg/dL 12 11 9   Creatinine 0.44 - 1.00 mg/dL 0.89 0.64 0.61  BUN/Creat Ratio 12 - 28 - - -  Sodium 135 - 145 mmol/L 138 135 137  Potassium 3.5 - 5.1 mmol/L 3.6 3.9 3.3(L)  Chloride 98 - 111 mmol/L 102 98 98  CO2 22 - 32 mmol/L 26 27 29   Calcium 8.9 - 10.3 mg/dL 9.2 9.0 8.7(L)    Chest imaging: CTA Chest 04/27/20 1. No acute pulmonary embolism. 2. Small bilateral pleural effusions with adjacent atelectasis. 3. Stable mild interlobular septal thickening, which may be seen in patients with mild interstitial edema. 4. Aberrant right subclavian artery, a normal variant.  PFT: PFT Results Latest Ref Rng & Units 05/08/2020  FVC-Pre L 2.63  FVC-Predicted Pre % 81  FVC-Post L 2.74  FVC-Predicted Post % 85  Pre FEV1/FVC % % 81  Post FEV1/FCV % % 85  FEV1-Pre L 2.13  FEV1-Predicted Pre % 86  FEV1-Post L 2.33  DLCO uncorrected ml/min/mmHg 27.60  DLCO UNC% % 135  DLCO corrected ml/min/mmHg 28.51  DLCO COR %Predicted % 140  DLVA Predicted % 150  TLC L 4.74  TLC % Predicted % 91  RV % Predicted % 73    Echo: 04/27/20 1. Left ventricular ejection fraction, by estimation, is 55 to 60%. The  left ventricle has normal function. The left ventricle has no regional  wall motion abnormalities. Left ventricular diastolic parameters were  normal.  2. Right ventricular systolic function is normal. The right ventricular  size is normal.  3. Left atrial size was mildly dilated.  4. The mitral valve is normal in structure. No evidence of mitral valve  regurgitation. No evidence of mitral stenosis.  5. The aortic valve was not  well visualized. Aortic valve regurgitation  is not visualized. No aortic stenosis is present.  6. The inferior vena cava is dilated in size with >50% respiratory  variability, suggesting right atrial pressure of 8 mmHg.  Heart Catheterization:  Assessment & Plan:   Asthma, unspecified asthma severity, unspecified whether complicated, unspecified whether persistent  OSA (obstructive sleep apnea)  Class 3 severe obesity with serious comorbidity in adult, unspecified BMI, unspecified obesity type ([place])  Discussion: Brittany Middleton is a 69 year old woman, never smoker with history of obstructive sleep apnea on cpap, obesity, hypertension and haital hernia with GERD who comes to pulmonary clinic for hospital follow up after two recent admissions for urinary tract infections and involvement of cough and shortness of breath.  Her cough has been related to GERD and sinus congestion as well as reactive airways disease or asthma. She is to continue on PPI  therapy for GERD and flonase nasal spray for her sinuses. She is to start flovent 152mcg 2 puffs twice daily with a spacer and to continue as needed albuterol inhaler. Her recent PFTs do show come improvement with bronchodilator therapy although it did not meet a significant threshold.   Another component to her shortness of breath is concern for Heart Failure preserved EF as the most recent admission she benefited from diuretic therapy. She has been instructed to monitor her weight daily and to call if her weight is increasing 2-4 lbs within a 24-48 hours period. We will consider adding lasix therapy.   She has appointment in December for a CPAP titration study for her obstructive sleep apnea.   She is to follow up in 4 months  Freda Jackson, MD [facility] Office: [phone]   See Amion for Pager Details    Current Outpatient Medications:  .  acetaminophen (TYLENOL) 500 MG tablet, Take 1,000 mg by mouth every 6  (six) hours as needed for mild pain, moderate pain, fever or headache., Disp: , Rfl:  .  albuterol (PROVENTIL) (2.5 MG/3ML) 0.083% nebulizer solution, Take 2.5 mg by nebulization every 6 (six) hours as needed for wheezing or shortness of breath., Disp: , Rfl:  .  albuterol (VENTOLIN HFA) 108 (90 Base) MCG/ACT inhaler, Inhale 2 puffs into the lungs every 6 (six) hours as needed for wheezing or shortness of breath., Disp: 8 g, Rfl: 2 .  amoxicillin-clavulanate (AUGMENTIN) 875-125 MG tablet, Take 1 tablet by mouth 2 (two) times daily. One po bid x 7 days, Disp: 14 tablet, Rfl: 0 .  BIOTIN PO, Take 1 tablet by mouth daily., Disp: , Rfl:  .  Cholecalciferol (VITAMIN D-3) 25 MCG (1000 UT) CAPS, Take 1,000 Units by mouth daily., Disp: , Rfl:  .  clopidogrel (PLAVIX) 75 MG tablet, Take 1 tablet (75 mg total) by mouth daily. (Patient taking differently: Take 75 mg by mouth daily with supper. ), Disp: 90 tablet, Rfl: 3 .  Coenzyme Q10-Vitamin E (QUNOL ULTRA COQ10 PO), Take 1 capsule by mouth daily., Disp: , Rfl:  .  fluticasone (FLONASE) 50 MCG/ACT nasal spray, Place 1 spray into both nostrils daily. (Patient taking differently: Place 1 spray into both nostrils daily as needed for allergies or rhinitis. ), Disp: 1 g, Rfl: 2 .  fluticasone (FLOVENT HFA) 110 MCG/ACT inhaler, Inhale 2 puffs into the lungs 2 (two) times daily., Disp: 1 each, Rfl: 6 .  fosfomycin (MONUROL) 3 g PACK, Take 3 g by mouth once., Disp: , Rfl:  .  guaiFENesin (ROBITUSSIN) 100 MG/5ML SOLN, Take 5 mLs (100 mg total) by mouth every 4 (four) hours as needed for cough or to loosen phlegm., Disp: 236 mL, Rfl: 0 .  hydrochlorothiazide (HYDRODIURIL) 25 MG tablet, Take 1 tablet (25 mg total) by mouth daily., Disp: 30 tablet, Rfl: 2 .  hydrocortisone 2.5 % cream, Apply 1 application topically daily as needed (forehead). , Disp: , Rfl:  .  IRON PO, Take 1 tablet by mouth 2 (two) times a week. , Disp: , Rfl:  .  losartan (COZAAR) 50 MG tablet, Take 1  tablet (50 mg total) by mouth daily., Disp: 30 tablet, Rfl: 2 .  Melatonin 5 MG CHEW, Chew 5 mg by mouth at bedtime. , Disp: , Rfl:  .  Multiple Vitamins-Minerals (MULTIVITAMIN WITH MINERALS) tablet, Take 1 tablet by mouth daily., Disp: , Rfl:  .  pantoprazole (PROTONIX) 40 MG tablet, Take 1 tablet (40  mg total) by mouth daily., Disp: 30 tablet, Rfl: 1 .  PRESCRIPTION MEDICATION, Inhale into the lungs at bedtime. CPAP, Disp: , Rfl:  .  rosuvastatin (CRESTOR) 20 MG tablet, Take 1 tablet (20 mg total) by mouth daily. (Patient taking differently: Take 20 mg by mouth daily after supper. ), Disp: 90 tablet, Rfl: 2 .  TURMERIC PO, Take 1 capsule by mouth daily., Disp: , Rfl:

## 2020-05-20 ENCOUNTER — Ambulatory Visit (INDEPENDENT_AMBULATORY_CARE_PROVIDER_SITE_OTHER): Payer: Medicare Other | Admitting: Urology

## 2020-05-20 ENCOUNTER — Other Ambulatory Visit: Payer: Self-pay

## 2020-05-20 VITALS — BP 150/82 | HR 69 | Temp 98.3°F | Ht 65.0 in | Wt 261.0 lb

## 2020-05-20 DIAGNOSIS — N3000 Acute cystitis without hematuria: Secondary | ICD-10-CM

## 2020-05-20 LAB — MICROSCOPIC EXAMINATION: Renal Epithel, UA: NONE SEEN /hpf

## 2020-05-20 LAB — URINALYSIS, ROUTINE W REFLEX MICROSCOPIC
Bilirubin, UA: NEGATIVE
Glucose, UA: NEGATIVE
Ketones, UA: NEGATIVE
Leukocytes,UA: NEGATIVE
Nitrite, UA: NEGATIVE
Protein,UA: NEGATIVE
Specific Gravity, UA: 1.015 (ref 1.005–1.030)
Urobilinogen, Ur: 0.2 mg/dL (ref 0.2–1.0)
pH, UA: 6.5 (ref 5.0–7.5)

## 2020-05-20 MED ORDER — NITROFURANTOIN MONOHYD MACRO 100 MG PO CAPS: 100.0000 mg | ORAL_CAPSULE | Freq: Two times a day (BID) | ORAL | 0 refills | Status: DC

## 2020-05-20 NOTE — Progress Notes (Signed)
Urological Symptom Review  Patient is experiencing the following symptoms: Frequent urination Hard to postpone urination Burning/pain with urination Get up at night to urinate Leakage of urine Urinary tract infection   Review of Systems  Gastrointestinal (upper)  : Indigestion/heartburn  Gastrointestinal (lower) : Negative for lower GI symptoms  Constitutional : Fatigue  Skin: Negative for skin symptoms  Eyes: Negative for eye symptoms  Ear/Nose/Throat : Negative for Ear/Nose/Throat symptoms  Hematologic/Lymphatic: Negative for Hematologic/Lymphatic symptoms  Cardiovascular : Negative for cardiovascular symptoms  Respiratory : Negative for respiratory symptoms  Endocrine: Negative for endocrine symptoms  Musculoskeletal: Back pain  Neurological: Negative for neurological symptoms  Psychologic: Negative for psychiatric symptoms

## 2020-05-20 NOTE — Progress Notes (Signed)
H&P  Chief Complaint: F/U of UTI  History of Present Illness: Brittany Middleton is a 69 y.o. year old female new patient here for f/u of sepsis/UTI.  Pt was admitted twice in the past 2 months with a septic, febrile UTI (fever peaked at 103.6). Pt received 3 days of intravenous abx during her first stay in the hospital (10.10 - 10.13) but was not sent home on oral abx. She reports that her UTI symptoms are almost entirely resolved at this time but notes a persistent "bruise-like" pain near her pubic region.  Pt reports that she has had 3 UTIs during her lifetime - once 5 years ago, and 2 others in her non-specified past.  CT scan of the abdomen and pelvis while in the hospital revealed no renal calculi, hydronephrosis, renal mass, no obvious bladder abnormality.  Past Medical History:  Diagnosis Date  . Acute blood loss anemia 07/17/2018  . Anemia 07/17/2018  . Ascending aortic aneurysm ([place]) 05/23/2019  . Cervical disc disease    MRI 2016  . Chest pain with moderate risk for cardiac etiology 02/27/2018  . 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 Gastrointestinal bleeding 07/25/2018  . Gastrointestinal hemorrhage with melena 07/16/2018  . Hepatic steatosis    per imaging 2016, 2019  . Hiatal hernia   . History of palpitations    Holter monitor, 2017: NSR, occasional PVC, PACs, no arrhythmia  . HLD (hyperlipidemia) 02/27/2018  . HTN (hypertension) 02/27/2018  . Hypercholesteremia   . Hypercholesterolemia 04/07/2017  . Hypertension   . Hypertensive disorder 04/07/2017  . Hypokalemia 07/17/2018  . Lumbar disc disease    MRI, 2017   . Morbid obesity ([place]) 05/22/2018  . Overweight 03/03/2018  . Palpitations 08/19/2015  . Pyelonephritis 03/30/2020  . Sepsis secondary to UTI ([place]) 04/25/2020  . Severe sepsis with acute organ dysfunction ([place]) 04/25/2020  . UTI (urinary tract infection)     Past Surgical History:  Procedure Laterality Date  . BLADDER REPAIR    . BREAST EXCISIONAL BIOPSY Left   .  CARPAL TUNNEL RELEASE Right   . CATARACT EXTRACTION, BILATERAL  2014  . COLONOSCOPY WITH PROPOFOL N/A 07/18/2018   Procedure: COLONOSCOPY WITH PROPOFOL;  Surgeon: Laurence Spates, MD;  Location: [facility];  Service: Endoscopy;  Laterality: N/A;  . ESOPHAGOGASTRODUODENOSCOPY (EGD) WITH PROPOFOL N/A 07/17/2018   Procedure: ESOPHAGOGASTRODUODENOSCOPY (EGD) WITH PROPOFOL;  Surgeon: Laurence Spates, MD;  Location: [facility];  Service: Endoscopy;  Laterality: N/A;  . REPAIR RECTOCELE      Home Medications:  (Not in a hospital admission)   Allergies:  Allergies  Allergen Reactions  . Ciprofloxacin Other (See Comments)    Caused PAIN IN HANDS   . Codeine Nausea And Vomiting    Family History  Problem Relation Age of Onset  . CAD Mother   . Breast cancer Mother 6    Social History:  reports that she has never smoked. She has never used smokeless tobacco. She reports that she does not drink alcohol and does not use drugs.  ROS: A complete review of systems was performed.  All systems are negative except for pertinent findings as noted.  Physical Exam:  Vital signs in last 24 hours:   General:  Alert and oriented, No acute distress HEENT: Normocephalic, atraumatic Neck: No JVD Extremities: No edema Neurologic: Grossly intact  I have reviewed prior pt notes  I have reviewed notes from referring/previous physicians  I have reviewed urinalysis results  I have independently reviewed prior imaging--CT scan  I have reviewed prior urine culture--most recent culture revealed a multiply resistant E. coli.  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 She has been stabilized but should continue with nitrofurantoin regimen in order to inhibit recurrence.  Plan:  1. Urine sent for culture today.  2. Pt provided with 10 day regimen of nitrofurantoin.  3. F/U in 2 months for OV and symptom recheck.  4.  I also recommend  probiotics  Budd Laba 05/20/2020, 12:12 PM  Lillette Boxer. Servando Kyllonen MD

## 2020-05-21 ENCOUNTER — Telehealth: Payer: Medicare Other | Admitting: Cardiology

## 2020-05-21 ENCOUNTER — Encounter (HOSPITAL_COMMUNITY): Payer: Self-pay | Admitting: Emergency Medicine

## 2020-05-21 ENCOUNTER — Emergency Department (HOSPITAL_COMMUNITY): Payer: Medicare Other

## 2020-05-21 ENCOUNTER — Telehealth: Payer: Self-pay

## 2020-05-21 ENCOUNTER — Other Ambulatory Visit: Payer: Self-pay

## 2020-05-21 ENCOUNTER — Inpatient Hospital Stay (HOSPITAL_COMMUNITY)
Admission: EM | Admit: 2020-05-21 | Discharge: 2020-05-24 | DRG: 872 | Disposition: A | Discharge status: Home / Self Care | Payer: Medicare Other | Attending: Internal Medicine | Admitting: Internal Medicine

## 2020-05-21 DIAGNOSIS — K921 Melena: Secondary | ICD-10-CM | POA: Diagnosis present

## 2020-05-21 DIAGNOSIS — Z79899 Other long term (current) drug therapy: Secondary | ICD-10-CM

## 2020-05-21 DIAGNOSIS — Z87440 Personal history of urinary (tract) infections: Secondary | ICD-10-CM | POA: Diagnosis not present

## 2020-05-21 DIAGNOSIS — I10 Essential (primary) hypertension: Secondary | ICD-10-CM | POA: Diagnosis present

## 2020-05-21 DIAGNOSIS — I712 Thoracic aortic aneurysm, without rupture: Secondary | ICD-10-CM | POA: Diagnosis present

## 2020-05-21 DIAGNOSIS — Z20822 Contact with and (suspected) exposure to covid-19: Secondary | ICD-10-CM | POA: Diagnosis present

## 2020-05-21 DIAGNOSIS — J45909 Unspecified asthma, uncomplicated: Secondary | ICD-10-CM | POA: Diagnosis present

## 2020-05-21 DIAGNOSIS — R6520 Severe sepsis without septic shock: Secondary | ICD-10-CM

## 2020-05-21 DIAGNOSIS — N390 Urinary tract infection, site not specified: Secondary | ICD-10-CM | POA: Diagnosis present

## 2020-05-21 DIAGNOSIS — G4733 Obstructive sleep apnea (adult) (pediatric): Secondary | ICD-10-CM | POA: Diagnosis present

## 2020-05-21 DIAGNOSIS — B9620 Unspecified Escherichia coli [E. coli] as the cause of diseases classified elsewhere: Secondary | ICD-10-CM | POA: Diagnosis present

## 2020-05-21 DIAGNOSIS — Z1612 Extended spectrum beta lactamase (ESBL) resistance: Secondary | ICD-10-CM | POA: Diagnosis present

## 2020-05-21 DIAGNOSIS — J4520 Mild intermittent asthma, uncomplicated: Secondary | ICD-10-CM | POA: Diagnosis present

## 2020-05-21 DIAGNOSIS — G4730 Sleep apnea, unspecified: Secondary | ICD-10-CM | POA: Diagnosis present

## 2020-05-21 DIAGNOSIS — M5090 Cervical disc disorder, unspecified, unspecified cervical region: Secondary | ICD-10-CM | POA: Diagnosis present

## 2020-05-21 DIAGNOSIS — E876 Hypokalemia: Secondary | ICD-10-CM | POA: Diagnosis present

## 2020-05-21 DIAGNOSIS — E7800 Pure hypercholesterolemia, unspecified: Secondary | ICD-10-CM | POA: Diagnosis present

## 2020-05-21 DIAGNOSIS — Z885 Allergy status to narcotic agent status: Secondary | ICD-10-CM

## 2020-05-21 DIAGNOSIS — Z7902 Long term (current) use of antithrombotics/antiplatelets: Secondary | ICD-10-CM

## 2020-05-21 DIAGNOSIS — A419 Sepsis, unspecified organism: Principal | ICD-10-CM

## 2020-05-21 DIAGNOSIS — K219 Gastro-esophageal reflux disease without esophagitis: Secondary | ICD-10-CM | POA: Diagnosis present

## 2020-05-21 DIAGNOSIS — I2510 Atherosclerotic heart disease of native coronary artery without angina pectoris: Secondary | ICD-10-CM | POA: Diagnosis present

## 2020-05-21 DIAGNOSIS — J811 Chronic pulmonary edema: Secondary | ICD-10-CM | POA: Diagnosis present

## 2020-05-21 DIAGNOSIS — E785 Hyperlipidemia, unspecified: Secondary | ICD-10-CM | POA: Diagnosis present

## 2020-05-21 DIAGNOSIS — Z881 Allergy status to other antibiotic agents status: Secondary | ICD-10-CM

## 2020-05-21 DIAGNOSIS — E872 Acidosis: Secondary | ICD-10-CM | POA: Diagnosis present

## 2020-05-21 DIAGNOSIS — B9629 Other Escherichia coli [E. coli] as the cause of diseases classified elsewhere: Secondary | ICD-10-CM | POA: Diagnosis present

## 2020-05-21 HISTORY — DX: Sepsis, unspecified organism: A41.9

## 2020-05-21 HISTORY — DX: Severe sepsis without septic shock: R65.20

## 2020-05-21 LAB — CBC WITH DIFFERENTIAL/PLATELET
Abs Immature Granulocytes: 0.13 10*3/uL — ABNORMAL HIGH (ref 0.00–0.07)
Basophils Absolute: 0.1 10*3/uL (ref 0.0–0.1)
Basophils Relative: 0 %
Eosinophils Absolute: 0.1 10*3/uL (ref 0.0–0.5)
Eosinophils Relative: 1 %
HCT: 40.2 % (ref 36.0–46.0)
Hemoglobin: 12.8 g/dL (ref 12.0–15.0)
Immature Granulocytes: 1 %
Lymphocytes Relative: 1 %
Lymphs Abs: 0.2 10*3/uL — ABNORMAL LOW (ref 0.7–4.0)
MCH: 29.8 pg (ref 26.0–34.0)
MCHC: 31.8 g/dL (ref 30.0–36.0)
MCV: 93.5 fL (ref 80.0–100.0)
Monocytes Absolute: 0.6 10*3/uL (ref 0.1–1.0)
Monocytes Relative: 4 %
Neutro Abs: 15.1 10*3/uL — ABNORMAL HIGH (ref 1.7–7.7)
Neutrophils Relative %: 93 %
Platelets: 183 10*3/uL (ref 150–400)
RBC: 4.3 MIL/uL (ref 3.87–5.11)
RDW: 14.6 % (ref 11.5–15.5)
WBC: 16.2 10*3/uL — ABNORMAL HIGH (ref 4.0–10.5)
nRBC: 0 % (ref 0.0–0.2)

## 2020-05-21 LAB — COMPREHENSIVE METABOLIC PANEL
ALT: 55 U/L — ABNORMAL HIGH (ref 0–44)
AST: 53 U/L — ABNORMAL HIGH (ref 15–41)
Albumin: 3.6 g/dL (ref 3.5–5.0)
Alkaline Phosphatase: 40 U/L (ref 38–126)
Anion gap: 14 (ref 5–15)
BUN: 15 mg/dL (ref 8–23)
CO2: 22 mmol/L (ref 22–32)
Calcium: 8.4 mg/dL — ABNORMAL LOW (ref 8.9–10.3)
Chloride: 99 mmol/L (ref 98–111)
Creatinine, Ser: 0.91 mg/dL (ref 0.44–1.00)
GFR, Estimated: >60 mL/min (ref >60)
Glucose, Bld: 154 mg/dL — ABNORMAL HIGH (ref 70–99)
Potassium: 3.1 mmol/L — ABNORMAL LOW (ref 3.5–5.1)
Sodium: 135 mmol/L (ref 135–145)
Total Bilirubin: 1.5 mg/dL — ABNORMAL HIGH (ref 0.3–1.2)
Total Protein: 6.6 g/dL (ref 6.5–8.1)

## 2020-05-21 LAB — URINALYSIS, ROUTINE W REFLEX MICROSCOPIC
Bilirubin Urine: NEGATIVE
Glucose, UA: NEGATIVE mg/dL
Ketones, ur: 5 mg/dL — AB
Leukocytes,Ua: NEGATIVE
Nitrite: NEGATIVE
Protein, ur: NEGATIVE mg/dL
Specific Gravity, Urine: 1.021 (ref 1.005–1.030)
pH: 5 (ref 5.0–8.0)

## 2020-05-21 LAB — APTT: aPTT: 33 seconds (ref 24–36)

## 2020-05-21 LAB — RESP PANEL BY RT-PCR (FLU A&B, COVID) ARPGX2
Influenza A by PCR: NEGATIVE
Influenza B by PCR: NEGATIVE
SARS Coronavirus 2 by RT PCR: NEGATIVE

## 2020-05-21 LAB — PROTIME-INR
INR: 1.2 (ref 0.8–1.2)
Prothrombin Time: 15 seconds (ref 11.4–15.2)

## 2020-05-21 LAB — LACTIC ACID, PLASMA
Lactic Acid, Venous: 4.6 mmol/L (ref 0.5–1.9)
Lactic Acid, Venous: 2.8 mmol/L (ref 0.5–1.9)

## 2020-05-21 LAB — BRAIN NATRIURETIC PEPTIDE: B Natriuretic Peptide: 153.7 pg/mL — ABNORMAL HIGH (ref 0.0–100.0)

## 2020-05-21 LAB — MAGNESIUM: Magnesium: 1.7 mg/dL (ref 1.7–2.4)

## 2020-05-21 MED ORDER — LACTATED RINGERS IV BOLUS
1000.0000 mL | Freq: Once | INTRAVENOUS | Status: AC
  Administered 2020-05-21: 1000 mL via INTRAVENOUS

## 2020-05-21 MED ORDER — ACETAMINOPHEN 325 MG PO TABS
650.0000 mg | ORAL_TABLET | Freq: Four times a day (QID) | ORAL | Status: DC | PRN
  Administered 2020-05-22 – 2020-05-24 (×7): 650 mg via ORAL
  Filled 2020-05-21 (×7): qty 2

## 2020-05-21 MED ORDER — LACTATED RINGERS IV SOLN: INTRAVENOUS | Status: DC

## 2020-05-21 MED ORDER — ENOXAPARIN SODIUM 60 MG/0.6ML ~~LOC~~ SOLN
60.0000 mg | SUBCUTANEOUS | Status: DC
  Administered 2020-05-21 – 2020-05-23 (×3): 60 mg via SUBCUTANEOUS
  Filled 2020-05-21 (×3): qty 0.6

## 2020-05-21 MED ORDER — SODIUM CHLORIDE 0.9 % IV SOLN: 2.0000 g | Freq: Once | INTRAVENOUS | Status: DC

## 2020-05-21 MED ORDER — SODIUM CHLORIDE 0.9 % IV SOLN
1.0000 g | Freq: Three times a day (TID) | INTRAVENOUS | Status: DC
  Administered 2020-05-21 – 2020-05-24 (×8): 1 g via INTRAVENOUS
  Filled 2020-05-21 (×9): qty 1

## 2020-05-21 MED ORDER — ACETAMINOPHEN 500 MG PO TABS
1000.0000 mg | ORAL_TABLET | Freq: Once | ORAL | Status: AC
  Administered 2020-05-21: 1000 mg via ORAL
  Filled 2020-05-21: qty 2

## 2020-05-21 MED ORDER — ROSUVASTATIN CALCIUM 20 MG PO TABS
20.0000 mg | ORAL_TABLET | Freq: Every day | ORAL | Status: DC
  Administered 2020-05-22 – 2020-05-23 (×2): 20 mg via ORAL
  Filled 2020-05-21 (×2): qty 1

## 2020-05-21 MED ORDER — ONDANSETRON HCL 4 MG PO TABS: 4.0000 mg | ORAL_TABLET | Freq: Four times a day (QID) | ORAL | Status: DC | PRN

## 2020-05-21 MED ORDER — FLUTICASONE PROPIONATE HFA 110 MCG/ACT IN AERO: 2.0000 | INHALATION_SPRAY | Freq: Two times a day (BID) | RESPIRATORY_TRACT | Status: DC

## 2020-05-21 MED ORDER — ONDANSETRON HCL 4 MG/2ML IJ SOLN: 4.0000 mg | Freq: Four times a day (QID) | INTRAMUSCULAR | Status: DC | PRN

## 2020-05-21 MED ORDER — LOSARTAN POTASSIUM 50 MG PO TABS
50.0000 mg | ORAL_TABLET | Freq: Every day | ORAL | Status: DC
  Administered 2020-05-22 – 2020-05-24 (×3): 50 mg via ORAL
  Filled 2020-05-21: qty 1
  Filled 2020-05-21: qty 2
  Filled 2020-05-21: qty 1

## 2020-05-21 MED ORDER — PANTOPRAZOLE SODIUM 40 MG PO TBEC
40.0000 mg | DELAYED_RELEASE_TABLET | Freq: Every day | ORAL | Status: DC
  Administered 2020-05-22 – 2020-05-24 (×3): 40 mg via ORAL
  Filled 2020-05-21 (×3): qty 1

## 2020-05-21 MED ORDER — BUDESONIDE 0.25 MG/2ML IN SUSP: 0.2500 mg | Freq: Two times a day (BID) | RESPIRATORY_TRACT | Status: DC

## 2020-05-21 MED ORDER — ALBUTEROL SULFATE HFA 108 (90 BASE) MCG/ACT IN AERS: 2.0000 | INHALATION_SPRAY | Freq: Four times a day (QID) | RESPIRATORY_TRACT | Status: DC | PRN

## 2020-05-21 MED ORDER — ACETAMINOPHEN 650 MG RE SUPP: 650.0000 mg | Freq: Four times a day (QID) | RECTAL | Status: DC | PRN

## 2020-05-21 MED ORDER — POTASSIUM CHLORIDE CRYS ER 20 MEQ PO TBCR
50.0000 meq | EXTENDED_RELEASE_TABLET | Freq: Once | ORAL | Status: AC
  Administered 2020-05-21: 50 meq via ORAL
  Filled 2020-05-21: qty 1

## 2020-05-21 MED ORDER — BUDESONIDE 0.25 MG/2ML IN SUSP
0.2500 mg | Freq: Two times a day (BID) | RESPIRATORY_TRACT | Status: DC
  Administered 2020-05-22 – 2020-05-23 (×4): 0.25 mg via RESPIRATORY_TRACT
  Filled 2020-05-21 (×4): qty 2

## 2020-05-21 MED ORDER — MAGNESIUM SULFATE IN D5W 1-5 GM/100ML-% IV SOLN
1.0000 g | Freq: Once | INTRAVENOUS | Status: AC
  Administered 2020-05-21: 1 g via INTRAVENOUS
  Filled 2020-05-21: qty 100

## 2020-05-21 MED ORDER — CLOPIDOGREL BISULFATE 75 MG PO TABS
75.0000 mg | ORAL_TABLET | Freq: Every day | ORAL | Status: DC
  Administered 2020-05-22 – 2020-05-23 (×2): 75 mg via ORAL
  Filled 2020-05-21 (×2): qty 1

## 2020-05-21 NOTE — H&P (Signed)
History and Physical    Brittany Middleton JOI:[id number] DOB: 09-02-1950 DOA: 05/21/2020  PCP: London Pepper, MD  Patient coming from: Home  I have personally briefly reviewed patient's old medical records in [place]  Chief Complaint: Fever, nausea, vomiting  HPI: Brittany Middleton is a 69 y.o. female with medical history significant for asthma, HTN, HLD, CAD, and OSA on CPAP who presents to the ED for evaluation of fever and recurrent UTIs.  Patient had 2 recent admissions the last 2 months for pyelonephritis and sepsis due to UTI.  Most recent admission was 04/25/2020-04/29/2020.  Urine culture was without growth and she was treated with empiric IV ceftriaxone.  She had been seen in the ED on 05/07/2020 for acute cystitis.  She was discharged with a prescription for Augmentin.  Urine culture subsequently grew out ESBL E. coli and antibiotic with switched to fosfomycin.  She was seen by urology, Dr. Diona Fanti, yesterday 05/20/2020.  Repeat urine culture was sent and she was provided with 10-day regimen of nitrofurantoin.  Patient states she took nitrofurantoin afterwards developed nausea and vomiting which she states occurred the last time she took nitrofurantoin as well.  This morning she developed low-grade fever, diaphoresis, chills and presented to the ED for further evaluation.  She says she has been having increased urinary urgency with decreased output as expected.  She has had cramping lower abdominal discomfort.  She denies any specific burning sensation with urination.  She denies any chest pain, dyspnea, cough.  ED Course:  Initial vitals showed BP 152/66, pulse 85, RR 20, temp 102.3 F, SPO2 93% on room air.  Labs are notable for WBC 16.2, hemoglobin 12.8, platelets 183,000, sodium 135, potassium 3.1, bicarb 22, BUN 15, creatinine 0.91, serum glucose 154, AST 53, ALT 55, alk phos 40, total bilirubin 1.5, lactic acid 4.6 > 2.8, magnesium 1.7, BNP 153.7.  Urinalysis  shows negative nitrates, negative leukocytes, 0-5 RBC/hpf, 0-5 WBC/hpf, rare bacteria microscopy.  Blood and urine cultures were obtained and pending.  SARS-CoV-2 PCR is negative.  Influenza A/B PCR negative.  Portable chest x-ray showed mild interstitial opacity without evidence of pneumothorax or effusion.  CT renal stone study was negative for evidence of urinary tract calculus or hydronephrosis or perinephric inflammatory changes.  Patient was given 2 L LR, 50 mEq of oral potassium, and started on IV meropenem.  The hospitalist service was consulted to admit for further evaluation management.  Review of Systems: All systems reviewed and are negative except as documented in history of present illness above.   Past Medical History:  Diagnosis Date  . Acute blood loss anemia 07/17/2018  . Anemia 07/17/2018  . Ascending aortic aneurysm ([place]) 05/23/2019  . Asthma   . Cervical disc disease    MRI 2016  . Chest pain with moderate risk for cardiac etiology 02/27/2018  . 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 Gastrointestinal bleeding 07/25/2018  . Gastrointestinal hemorrhage with melena 07/16/2018  . GERD (gastroesophageal reflux disease)   . Hepatic steatosis    per imaging 2016, 2019  . Hiatal hernia   . History of palpitations    Holter monitor, 2017: NSR, occasional PVC, PACs, no arrhythmia  . HLD (hyperlipidemia) 02/27/2018  . HTN (hypertension) 02/27/2018  . Hypercholesteremia   . Hypercholesterolemia 04/07/2017  . Hypertension   . Hypertensive disorder 04/07/2017  . Hypokalemia 07/17/2018  . Lumbar disc disease    MRI, 2017   . Morbid obesity ([place]) 05/22/2018  . Overweight 03/03/2018  . Palpitations  08/19/2015  . Pyelonephritis 03/30/2020  . Sepsis secondary to UTI ([place]) 04/25/2020  . Severe sepsis with acute organ dysfunction ([place]) 04/25/2020  . Sleep apnea   . UTI (urinary tract infection)     Past Surgical History:  Procedure Laterality Date  . BLADDER REPAIR    . BREAST EXCISIONAL  BIOPSY Left   . CARPAL TUNNEL RELEASE Right   . CATARACT EXTRACTION, BILATERAL  2014  . COLONOSCOPY WITH PROPOFOL N/A 07/18/2018   Procedure: COLONOSCOPY WITH PROPOFOL;  Surgeon: Laurence Spates, MD;  Location: [facility];  Service: Endoscopy;  Laterality: N/A;  . ESOPHAGOGASTRODUODENOSCOPY (EGD) WITH PROPOFOL N/A 07/17/2018   Procedure: ESOPHAGOGASTRODUODENOSCOPY (EGD) WITH PROPOFOL;  Surgeon: Laurence Spates, MD;  Location: [facility];  Service: Endoscopy;  Laterality: N/A;  . REPAIR RECTOCELE      Social History:  reports that she has never smoked. She has never used smokeless tobacco. She reports that she does not drink alcohol and does not use drugs.  Allergies  Allergen Reactions  . Ciprofloxacin Other (See Comments)    Caused PAIN IN HANDS   . Codeine Nausea And Vomiting  . Macrobid [Nitrofurantoin] Nausea And Vomiting    Family History  Problem Relation Age of Onset  . CAD Mother   . Breast cancer Mother 86     Prior to Admission medications   Medication Sig Start Date End Date Taking? Authorizing Provider  acetaminophen (TYLENOL) 500 MG tablet Take 1,000 mg by mouth every 6 (six) hours as needed for mild pain, moderate pain, fever or headache.    [provider]  albuterol (PROVENTIL) (2.5 MG/3ML) 0.083% nebulizer solution Take 2.5 mg by nebulization every 6 (six) hours as needed for wheezing or shortness of breath.    [provider]  albuterol (VENTOLIN HFA) 108 (90 Base) MCG/ACT inhaler Inhale 2 puffs into the lungs every 6 (six) hours as needed for wheezing or shortness of breath. 04/01/20   Regalado, Belkys A, MD  amoxicillin-clavulanate (AUGMENTIN) 875-125 MG tablet Take 1 tablet by mouth 2 (two) times daily. One po bid x 7 days 05/07/20   Daleen Bo, MD  BIOTIN PO Take 1 tablet by mouth daily.    [provider]  Cholecalciferol (VITAMIN D-3) 25 MCG (1000 UT) CAPS Take 1,000 Units by mouth daily.    [provider]   clopidogrel (PLAVIX) 75 MG tablet Take 1 tablet (75 mg total) by mouth daily. Patient taking differently: Take 75 mg by mouth daily with supper.  06/28/19   Revankar, [name], MD  Coenzyme Q10-Vitamin E (QUNOL ULTRA COQ10 PO) Take 1 capsule by mouth daily.    [provider]  fluticasone (FLONASE) 50 MCG/ACT nasal spray Place 1 spray into both nostrils daily. Patient taking differently: Place 1 spray into both nostrils daily as needed for allergies or rhinitis.  04/01/20   Regalado, Belkys A, MD  fluticasone (FLOVENT HFA) 110 MCG/ACT inhaler Inhale 2 puffs into the lungs 2 (two) times daily. 05/13/20 05/13/21  Freddi Starr, MD  fosfomycin (MONUROL) 3 g PACK Take 3 g by mouth once.    [provider]  guaiFENesin (ROBITUSSIN) 100 MG/5ML SOLN Take 5 mLs (100 mg total) by mouth every 4 (four) hours as needed for cough or to loosen phlegm. 04/29/20   Pokhrel, Corrie Mckusick, MD  hydrochlorothiazide (HYDRODIURIL) 25 MG tablet Take 1 tablet (25 mg total) by mouth daily. 04/29/20   Pokhrel, Corrie Mckusick, MD  hydrocortisone 2.5 % cream Apply 1 application topically daily as needed (  forehead).  04/13/20   [provider]  IRON PO Take 1 tablet by mouth 2 (two) times a week.     [provider]  losartan (COZAAR) 50 MG tablet Take 1 tablet (50 mg total) by mouth daily. 04/30/20   Pokhrel, Corrie Mckusick, MD  Melatonin 5 MG CHEW Chew 5 mg by mouth at bedtime.     [provider]  Multiple Vitamins-Minerals (MULTIVITAMIN WITH MINERALS) tablet Take 1 tablet by mouth daily.    [provider]  nitrofurantoin, macrocrystal-monohydrate, (MACROBID) 100 MG capsule Take 1 capsule (100 mg total) by mouth 2 (two) times daily for 7 days. 05/20/20 05/27/20  Franchot Gallo, MD  pantoprazole (PROTONIX) 40 MG tablet Take 1 tablet (40 mg total) by mouth daily. 04/01/20   Regalado, Cassie Freer, MD  PRESCRIPTION MEDICATION Inhale into the lungs at bedtime. CPAP    [provider]   rosuvastatin (CRESTOR) 20 MG tablet Take 1 tablet (20 mg total) by mouth daily. Patient taking differently: Take 20 mg by mouth daily after supper.  12/25/19 05/20/20  Revankar, [name], MD  TURMERIC PO Take 1 capsule by mouth daily.    [provider]    Physical Exam: Vitals:   05/21/20 2215 05/21/20 2300 05/22/20 0000 05/22/20 0100  BP: (!) 118/57 (!) 150/76 (!) 117/53   Pulse: 66 68 66 67  Resp: 20 (!) 23 19 (!) 22  Temp:      TempSrc:      SpO2: 90% 90% 92% 92%  Weight:      Height:       Constitutional: Obese woman resting supine in bed, NAD, calm, comfortable Eyes: PERRL, lids and conjunctivae normal ENMT: Mucous membranes are moist. Posterior pharynx clear of any exudate or lesions.Normal dentition.  Neck: normal, supple, no masses. Respiratory: clear to auscultation bilaterally, no wheezing, no crackles. Normal respiratory effort. No accessory muscle use.  Cardiovascular: Regular rate and rhythm, no murmurs / rubs / gallops. No extremity edema. 2+ pedal pulses. Abdomen: Suprapubic tenderness, no masses palpated. No hepatosplenomegaly. Bowel sounds positive.  Musculoskeletal: no clubbing / cyanosis. No joint deformity upper and lower extremities. Good ROM, no contractures. Normal muscle tone.  Skin: no rashes, lesions, ulcers. No induration Neurologic: CN 2-12 grossly intact. Sensation intact, Strength 5/5 in all 4.  Psychiatric: Normal judgment and insight. Alert and oriented x 3. Normal mood.   Labs on Admission: I have personally reviewed following labs and imaging studies  CBC: Recent Labs  Lab 05/21/20 1413  WBC 16.2*  NEUTROABS 15.1*  HGB 12.8  HCT 40.2  MCV 93.5  PLT 510   Basic Metabolic Panel: Recent Labs  Lab 05/21/20 1413 05/21/20 1545  NA 135  --   K 3.1*  --   CL 99  --   CO2 22  --   GLUCOSE 154*  --   BUN 15  --   CREATININE 0.91  --   CALCIUM 8.4*  --   MG  --  1.7   GFR: Estimated Creatinine Clearance: 77.3 mL/min (by C-G  formula based on SCr of 0.91 mg/dL). Liver Function Tests: Recent Labs  Lab 05/21/20 1413  AST 53*  ALT 55*  ALKPHOS 40  BILITOT 1.5*  PROT 6.6  ALBUMIN 3.6   No results for input(s): LIPASE, AMYLASE in the last 168 hours. No results for input(s): AMMONIA in the last 168 hours. Coagulation Profile: Recent Labs  Lab 05/21/20 1545  INR 1.2   Cardiac Enzymes: No results  for input(s): CKTOTAL, CKMB, CKMBINDEX, TROPONINI in the last 168 hours. BNP (last 3 results) No results for input(s): PROBNP in the last 8760 hours. HbA1C: No results for input(s): HGBA1C in the last 72 hours. CBG: No results for input(s): GLUCAP in the last 168 hours. Lipid Profile: No results for input(s): CHOL, HDL, LDLCALC, TRIG, CHOLHDL, LDLDIRECT in the last 72 hours. Thyroid Function Tests: No results for input(s): TSH, T4TOTAL, FREET4, T3FREE, THYROIDAB in the last 72 hours. Anemia Panel: No results for input(s): VITAMINB12, FOLATE, FERRITIN, TIBC, IRON, RETICCTPCT in the last 72 hours. Urine analysis:    Component Value Date/Time   COLORURINE YELLOW 05/21/2020 1818   APPEARANCEUR CLEAR 05/21/2020 1818   APPEARANCEUR Clear 05/20/2020 1422   LABSPEC 1.021 05/21/2020 1818   PHURINE 5.0 05/21/2020 1818   GLUCOSEU NEGATIVE 05/21/2020 1818   HGBUR MODERATE (A) 05/21/2020 1818   BILIRUBINUR NEGATIVE 05/21/2020 1818   BILIRUBINUR Negative 05/20/2020 1422   KETONESUR 5 (A) 05/21/2020 1818   PROTEINUR NEGATIVE 05/21/2020 1818   UROBILINOGEN 1.0 10/31/2014 1646   NITRITE NEGATIVE 05/21/2020 1818   LEUKOCYTESUR NEGATIVE 05/21/2020 1818    Radiological Exams on Admission: DG Chest Port 1 View  Result Date: 05/21/2020 CLINICAL DATA:  Questionable sepsis multiple recent UTI and EXAM: PORTABLE CHEST 1 VIEW COMPARISON:  CT 04/27/2020 FINDINGS: Increased attenuation towards the lung bases likely attributable in part to body habitus. Mild diffuse interstitial opacity with vascular cephalization and  congestion, cuffing, peripheral septal lines and fissural thickening. No visible pneumothorax or effusion. The aorta is calcified. The remaining cardiomediastinal contours are unremarkable. No acute osseous or soft tissue abnormality. Degenerative changes are present in the imaged spine and shoulders. Telemetry leads overlie the chest. IMPRESSION: Findings most suggestive of interstitial pulmonary edema. No focal consolidative process. Aortic Atherosclerosis (ICD10-I70.0). Electronically Signed   By: Lovena Le M.D.   On: 05/21/2020 16:23   CT Renal Stone Study  Result Date: 05/21/2020 CLINICAL DATA:  Right flank pain. EXAM: CT ABDOMEN AND PELVIS WITHOUT CONTRAST TECHNIQUE: Multidetector CT imaging of the abdomen and pelvis was performed following the standard protocol without IV contrast. COMPARISON:  CT May 07, 2020. FINDINGS: Lower chest: No acute abnormality.  Dependent atelectasis. Hepatobiliary: No focal liver abnormality is seen. Diffuse low attenuation of the liver, suggestive of hepatic steatosis. No gallstones, gallbladder wall thickening, or biliary dilatation. Pancreas: Similar partial fatty replacement. No pancreatic ductal dilatation or surrounding inflammatory changes. Spleen: Normal in size without focal abnormality. Adrenals/Urinary Tract: Adrenal glands are unremarkable. Kidneys are normal, without renal calculi, focal lesion, or hydronephrosis. Bilateral ureters are nondilated without evidence of radiodense calculi. Bladder is decompressed, limiting evaluation. Stomach/Bowel: Stomach is within normal limits. Appendix appears normal. No evidence of bowel wall thickening, distention, or inflammatory changes. Colonic diverticuli without evidence of acute diverticulitis. Vascular/Lymphatic: Aortic atherosclerosis. No enlarged abdominal or pelvic lymph nodes. Reproductive: Uterus and bilateral adnexa are unremarkable. Other: No free fluid or free gas. Musculoskeletal: Multilevel degenerative  disc disease in the visualized spine. IMPRESSION: 1. No evidence of urinary tract calculus or hydronephrosis. No perinephric inflammatory changes. 2. Colonic diverticulosis without evidence of diverticulitis. 3. Hepatic steatosis. Electronically Signed   By: Margaretha Sheffield MD   On: 05/21/2020 16:58    EKG: Personally reviewed. Sinus rhythm with low voltage.  Not significantly changed when compared to prior.  Assessment/Plan Principal Problem:   Severe sepsis (HCC) Active Problems:   HTN (hypertension)   HLD (hyperlipidemia)   Hypokalemia   Urinary tract infection due to  extended-spectrum beta lactamase (ESBL) producing Escherichia coli   Asthma   Sleep apnea  Brittany Middleton is a 69 y.o. female with medical history significant for asthma, HTN, HLD, CAD, and OSA on CPAP who is admitted with severe sepsis due to ESBL UTI.  Severe sepsis due to ESBL UTI: Patient admitted with fever, leukocytosis, lactic acidosis.  Prior urine culture 05/07/20 grew out ESBL E. Coli otherwise all other previous urine cultures in our system were without significant growth. -Continue IV meropenem -Follow blood and urine cultures  Hypokalemia: Repleted, recheck in a.m.  Hypertension: Currently stable, HCTZ on hold due to hypokalemia.  Asthma: Currently stable.  Continue Flovent and albuterol as needed.  Hyperlipidemia: Continue rosuvastatin.  Nonobstructive CAD: Continue rosuvastatin and Plavix.  OSA: Continue CPAP nightly.  DVT prophylaxis: Lovenox Code Status: Full code, confirmed with patient Family Communication: Discussed with patient's son at bedside Disposition Plan: From home and likely discharge to home pending improvement in sepsis physiology Consults called: None Admission status:  Status is: Inpatient  Remains inpatient appropriate because:IV treatments appropriate due to intensity of illness or inability to take PO and Inpatient level of care appropriate due to severity of  illness   Dispo: The patient is from: Home              Anticipated d/c is to: Home              Anticipated d/c date is: 3 days              Patient currently is not medically stable to d/c.  Zada Finders MD [facility]  If 7PM-7AM, please contact night-coverage [email or web address]  05/22/2020, 1:14 AM

## 2020-05-21 NOTE — ED Provider Notes (Signed)
[facility] DEPT Provider Note   CSN: [id number] Arrival date & time: 05/21/20  1343     History Chief Complaint  Patient presents with  . Emesis    Brittany Middleton is a 69 y.o. female.  HPI  Patient is a 69 year old female with a history of recurrent UTIs as well as multiple hospital admissions due to pyelonephritis as well as urosepsis in the past 2 months.  Most recent admission was on November 5.  She received IV antibiotics and was ultimately discharged on a course of oral antibiotics.  She followed up with urology yesterday and states that she was feeling well at that time.  She was started on 10 days of Macrobid based on her urine cultures.  She took 1 dose yesterday and once again became febrile and was experiencing nausea and vomiting which resolved earlier today.  She is still febrile and also notes some lower abdominal discomfort that she describes as "muscle like" as well as "like a spasm".  She denies any dysuria or hematuria but notes that she has been experiencing urinary urgency.   Pt evaluated yesterday by urology. HPI is below:  Chief Complaint: F/U of UTI  History of Present Illness: Brittany Middleton is a 69 y.o. year old female new patient here for f/u of sepsis/UTI.  Pt was admitted twice in the past 2 months with a septic, febrile UTI (fever peaked at 103.6). Pt received 3 days of intravenous abx during her first stay in the hospital (10.10 - 10.13) but was not sent home on oral abx. She reports that her UTI symptoms are almost entirely resolved at this time but notes a persistent "bruise-like" pain near her pubic region.  Pt reports that she has had 3 UTIs during her lifetime - once 5 years ago, and 2 others in her non-specified past.  CT scan of the abdomen and pelvis while in the hospital revealed no renal calculi, hydronephrosis, renal mass, no obvious bladder abnormality.      Past Medical History:  Diagnosis Date  .  Acute blood loss anemia 07/17/2018  . Anemia 07/17/2018  . Ascending aortic aneurysm ([place]) 05/23/2019  . Asthma   . Cervical disc disease    MRI 2016  . Chest pain with moderate risk for cardiac etiology 02/27/2018  . 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 Gastrointestinal bleeding 07/25/2018  . Gastrointestinal hemorrhage with melena 07/16/2018  . GERD (gastroesophageal reflux disease)   . Hepatic steatosis    per imaging 2016, 2019  . Hiatal hernia   . History of palpitations    Holter monitor, 2017: NSR, occasional PVC, PACs, no arrhythmia  . HLD (hyperlipidemia) 02/27/2018  . HTN (hypertension) 02/27/2018  . Hypercholesteremia   . Hypercholesterolemia 04/07/2017  . Hypertension   . Hypertensive disorder 04/07/2017  . Hypokalemia 07/17/2018  . Lumbar disc disease    MRI, 2017   . Morbid obesity ([place]) 05/22/2018  . Overweight 03/03/2018  . Palpitations 08/19/2015  . Pyelonephritis 03/30/2020  . Sepsis secondary to UTI ([place]) 04/25/2020  . Severe sepsis with acute organ dysfunction ([place]) 04/25/2020  . Sleep apnea   . UTI (urinary tract infection)     Patient Active Problem List   Diagnosis Date Noted  . Severe sepsis with acute organ dysfunction ([place]) 04/25/2020  . Sepsis secondary to UTI ([place]) 04/25/2020  . Cervical disc disease   . Hepatic steatosis   . History of palpitations   . Hypercholesteremia   . Hypertension   .  Lumbar disc disease   . UTI (urinary tract infection)   . Pyelonephritis 03/30/2020  . Ascending aortic aneurysm ([place]) 05/23/2019  . Gastrointestinal bleeding 07/25/2018  . Hiatal hernia   . Anemia 07/17/2018  . Acute blood loss anemia 07/17/2018  . Hypokalemia 07/17/2018  . Gastrointestinal hemorrhage with melena 07/16/2018  . Morbid obesity ([place]) 05/22/2018  . Overweight 03/03/2018  . 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 Chest pain with moderate risk for cardiac etiology 02/27/2018  . HTN (hypertension) 02/27/2018  . HLD (hyperlipidemia) 02/27/2018  .  Hypercholesterolemia 04/07/2017  . Hypertensive disorder 04/07/2017  . Palpitations 08/19/2015    Past Surgical History:  Procedure Laterality Date  . BLADDER REPAIR    . BREAST EXCISIONAL BIOPSY Left   . CARPAL TUNNEL RELEASE Right   . CATARACT EXTRACTION, BILATERAL  2014  . COLONOSCOPY WITH PROPOFOL N/A 07/18/2018   Procedure: COLONOSCOPY WITH PROPOFOL;  Surgeon: Laurence Spates, MD;  Location: [facility];  Service: Endoscopy;  Laterality: N/A;  . ESOPHAGOGASTRODUODENOSCOPY (EGD) WITH PROPOFOL N/A 07/17/2018   Procedure: ESOPHAGOGASTRODUODENOSCOPY (EGD) WITH PROPOFOL;  Surgeon: Laurence Spates, MD;  Location: [facility];  Service: Endoscopy;  Laterality: N/A;  . REPAIR RECTOCELE       OB History   No obstetric history on file.     Family History  Problem Relation Age of Onset  . CAD Mother   . Breast cancer Mother 39    Social History   Tobacco Use  . Smoking status: Never Smoker  . Smokeless tobacco: Never Used  Vaping Use  . Vaping Use: Never used  Substance Use Topics  . Alcohol use: No  . Drug use: No    Home Medications Prior to Admission medications   Medication Sig Start Date End Date Taking? Authorizing Provider  acetaminophen (TYLENOL) 500 MG tablet Take 1,000 mg by mouth every 6 (six) hours as needed for mild pain, moderate pain, fever or headache.    [provider]  albuterol (PROVENTIL) (2.5 MG/3ML) 0.083% nebulizer solution Take 2.5 mg by nebulization every 6 (six) hours as needed for wheezing or shortness of breath.    [provider]  albuterol (VENTOLIN HFA) 108 (90 Base) MCG/ACT inhaler Inhale 2 puffs into the lungs every 6 (six) hours as needed for wheezing or shortness of breath. 04/01/20   Regalado, Belkys A, MD  amoxicillin-clavulanate (AUGMENTIN) 875-125 MG tablet Take 1 tablet by mouth 2 (two) times daily. One po bid x 7 days 05/07/20   Daleen Bo, MD  BIOTIN PO Take 1 tablet by mouth daily.    [provider]   Cholecalciferol (VITAMIN D-3) 25 MCG (1000 UT) CAPS Take 1,000 Units by mouth daily.    [provider]  clopidogrel (PLAVIX) 75 MG tablet Take 1 tablet (75 mg total) by mouth daily. Patient taking differently: Take 75 mg by mouth daily with supper.  06/28/19   Revankar, [name], MD  Coenzyme Q10-Vitamin E (QUNOL ULTRA COQ10 PO) Take 1 capsule by mouth daily.    [provider]  fluticasone (FLONASE) 50 MCG/ACT nasal spray Place 1 spray into both nostrils daily. Patient taking differently: Place 1 spray into both nostrils daily as needed for allergies or rhinitis.  04/01/20   Regalado, Belkys A, MD  fluticasone (FLOVENT HFA) 110 MCG/ACT inhaler Inhale 2 puffs into the lungs 2 (two) times daily. 05/13/20 05/13/21  Freddi Starr, MD  fosfomycin (MONUROL) 3 g PACK Take 3 g by mouth once.    [provider]  guaiFENesin (ROBITUSSIN) 100 MG/5ML SOLN Take 5 mLs (100 mg total) by mouth every 4 (four) hours as needed for cough or to loosen phlegm. 04/29/20   Pokhrel, Corrie Mckusick, MD  hydrochlorothiazide (HYDRODIURIL) 25 MG tablet Take 1 tablet (25 mg total) by mouth daily. 04/29/20   Pokhrel, Corrie Mckusick, MD  hydrocortisone 2.5 % cream Apply 1 application topically daily as needed (forehead).  04/13/20   [provider]  IRON PO Take 1 tablet by mouth 2 (two) times a week.     [provider]  losartan (COZAAR) 50 MG tablet Take 1 tablet (50 mg total) by mouth daily. 04/30/20   Pokhrel, Corrie Mckusick, MD  Melatonin 5 MG CHEW Chew 5 mg by mouth at bedtime.     [provider]  Multiple Vitamins-Minerals (MULTIVITAMIN WITH MINERALS) tablet Take 1 tablet by mouth daily.    [provider]  nitrofurantoin, macrocrystal-monohydrate, (MACROBID) 100 MG capsule Take 1 capsule (100 mg total) by mouth 2 (two) times daily for 7 days. 05/20/20 05/27/20  Franchot Gallo, MD  pantoprazole (PROTONIX) 40 MG tablet Take 1 tablet (40 mg total) by mouth daily. 04/01/20    Regalado, Cassie Freer, MD  PRESCRIPTION MEDICATION Inhale into the lungs at bedtime. CPAP    [provider]  rosuvastatin (CRESTOR) 20 MG tablet Take 1 tablet (20 mg total) by mouth daily. Patient taking differently: Take 20 mg by mouth daily after supper.  12/25/19 05/20/20  Revankar, [name], MD  TURMERIC PO Take 1 capsule by mouth daily.    [provider]    Allergies    Ciprofloxacin and Codeine  Review of Systems   Review of Systems  All other systems reviewed and are negative. Ten systems reviewed and are negative for acute change, except as noted in the HPI.   Physical Exam Updated Vital Signs BP (!) 152/66 (BP Location: Right Arm)   Pulse 85   Temp (!) 102.3 F (39.1 C) (Oral)   Resp 20   SpO2 93%   Physical Exam Vitals and nursing note reviewed.  Constitutional:      General: She is not in acute distress.    Appearance: Normal appearance. She is obese. She is not ill-appearing, toxic-appearing or diaphoretic.  HENT:     Head: Normocephalic and atraumatic.     Right Ear: External ear normal.     Left Ear: External ear normal.     Nose: Nose normal.     Mouth/Throat:     Mouth: Mucous membranes are moist.     Pharynx: Oropharynx is clear. No oropharyngeal exudate or posterior oropharyngeal erythema.  Eyes:     Extraocular Movements: Extraocular movements intact.  Cardiovascular:     Rate and Rhythm: Normal rate and regular rhythm.     Pulses: Normal pulses.     Heart sounds: Normal heart sounds. No murmur heard.  No friction rub. No gallop.      Comments: RRR without M/R/G.  Pulmonary:     Effort: Pulmonary effort is normal. No respiratory distress.     Breath sounds: Normal breath sounds. No stridor. No wheezing, rhonchi or rales.  Abdominal:     General: Abdomen is flat.     Palpations: Abdomen is soft.     Tenderness: There is abdominal tenderness. There is right CVA tenderness. There is no left CVA tenderness.     Comments: Protuberant  abdomen.  Moderate tenderness noted overlying the suprapubic region.  Mild right flank pain noted with gentle fist strike.  No midline spine pain.  No left flank pain.  Musculoskeletal:        General: Normal range of motion.     Cervical back: Normal range of motion and neck supple. No tenderness.  Skin:    General: Skin is warm and dry.  Neurological:     General: No focal deficit present.     Mental Status: She is alert and oriented to person, place, and time.  Psychiatric:        Mood and Affect: Mood normal.        Behavior: Behavior normal.    ED Results / Procedures / Treatments   Labs (all labs ordered are listed, but only abnormal results are displayed) Labs Reviewed  LACTIC ACID, PLASMA - Abnormal; Notable for the following components:      Result Value   Lactic Acid, Venous 4.6 (*)    All other components within normal limits  LACTIC ACID, PLASMA - Abnormal; Notable for the following components:   Lactic Acid, Venous 2.8 (*)    All other components within normal limits  COMPREHENSIVE METABOLIC PANEL - Abnormal; Notable for the following components:   Potassium 3.1 (*)    Glucose, Bld 154 (*)    Calcium 8.4 (*)    AST 53 (*)    ALT 55 (*)    Total Bilirubin 1.5 (*)    All other components within normal limits  CBC WITH DIFFERENTIAL/PLATELET - Abnormal; Notable for the following components:   WBC 16.2 (*)    Neutro Abs 15.1 (*)    Lymphs Abs 0.2 (*)    Abs Immature Granulocytes 0.13 (*)    All other components within normal limits  URINALYSIS, ROUTINE W REFLEX MICROSCOPIC - Abnormal; Notable for the following components:   Hgb urine dipstick MODERATE (*)    Ketones, ur 5 (*)    Bacteria, UA RARE (*)    All other components within normal limits  BRAIN NATRIURETIC PEPTIDE - Abnormal; Notable for the following components:   B Natriuretic Peptide 153.7 (*)    All other components within normal limits  RESP PANEL BY RT-PCR (FLU A&B, COVID) [id number]  CULTURE, BLOOD  (ROUTINE X 2)  CULTURE, BLOOD (ROUTINE X 2)  URINE CULTURE  PROTIME-INR  APTT  MAGNESIUM   EKG EKG Interpretation  Date/Time:  Wednesday May 21 2020 16:06:42 EST Ventricular Rate:  75 PR Interval:    QRS Duration: 96 QT Interval:  390 QTC Calculation: 436 R Axis:   86 Text Interpretation: Sinus rhythm Low voltage with right axis deviation No significant change since last tracing Confirmed by Blanchie Dessert [phone]) on 05/21/2020 7:16:36 PM   Radiology DG Chest Port 1 View  Result Date: 05/21/2020 CLINICAL DATA:  Questionable sepsis multiple recent UTI and EXAM: PORTABLE CHEST 1 VIEW COMPARISON:  CT 04/27/2020 FINDINGS: Increased attenuation towards the lung bases likely attributable in part to body habitus. Mild diffuse interstitial opacity with vascular cephalization and congestion, cuffing, peripheral septal lines and fissural thickening. No visible pneumothorax or effusion. The aorta is calcified. The remaining cardiomediastinal contours are unremarkable. No acute osseous or soft tissue abnormality. Degenerative changes are present in the imaged spine and shoulders. Telemetry leads overlie the chest. IMPRESSION: Findings most suggestive of interstitial pulmonary edema. No focal consolidative process. Aortic Atherosclerosis (ICD10-I70.0). Electronically Signed   By: Lovena Le M.D.   On: 05/21/2020 16:23   CT Renal Stone Study  Result Date: 05/21/2020 CLINICAL DATA:  Right flank pain. EXAM: CT ABDOMEN AND  PELVIS WITHOUT CONTRAST TECHNIQUE: Multidetector CT imaging of the abdomen and pelvis was performed following the standard protocol without IV contrast. COMPARISON:  CT May 07, 2020. FINDINGS: Lower chest: No acute abnormality.  Dependent atelectasis. Hepatobiliary: No focal liver abnormality is seen. Diffuse low attenuation of the liver, suggestive of hepatic steatosis. No gallstones, gallbladder wall thickening, or biliary dilatation. Pancreas: Similar partial fatty  replacement. No pancreatic ductal dilatation or surrounding inflammatory changes. Spleen: Normal in size without focal abnormality. Adrenals/Urinary Tract: Adrenal glands are unremarkable. Kidneys are normal, without renal calculi, focal lesion, or hydronephrosis. Bilateral ureters are nondilated without evidence of radiodense calculi. Bladder is decompressed, limiting evaluation. Stomach/Bowel: Stomach is within normal limits. Appendix appears normal. No evidence of bowel wall thickening, distention, or inflammatory changes. Colonic diverticuli without evidence of acute diverticulitis. Vascular/Lymphatic: Aortic atherosclerosis. No enlarged abdominal or pelvic lymph nodes. Reproductive: Uterus and bilateral adnexa are unremarkable. Other: No free fluid or free gas. Musculoskeletal: Multilevel degenerative disc disease in the visualized spine. IMPRESSION: 1. No evidence of urinary tract calculus or hydronephrosis. No perinephric inflammatory changes. 2. Colonic diverticulosis without evidence of diverticulitis. 3. Hepatic steatosis. Electronically Signed   By: Margaretha Sheffield MD   On: 05/21/2020 16:58   Procedures Procedures   Medications Ordered in ED Medications  lactated ringers infusion ( Intravenous New Bag/Given 05/21/20 1605)  meropenem (MERREM) 1 g in sodium chloride 0.9 % 100 mL IVPB (0 g Intravenous Stopped 05/21/20 1654)  lactated ringers bolus 1,000 mL (1,000 mLs Intravenous New Bag/Given 05/21/20 1605)  lactated ringers bolus 1,000 mL (1,000 mLs Intravenous New Bag/Given 05/21/20 1604)  acetaminophen (TYLENOL) tablet 1,000 mg (1,000 mg Oral Given 05/21/20 1557)  potassium chloride (KLOR-CON) CR tablet 50 mEq (50 mEq Oral Given 05/21/20 1555)   ED Course  I have reviewed the triage vital signs and the nursing notes.  Pertinent labs & imaging results that were available during my care of the patient were reviewed by me and considered in my medical decision making (see chart for  details).  Clinical Course as of May 21 1924  Wed May 21, 2020  1535 Given Tylenol.  Code sepsis activated.  Temp(!): 102.3 F (39.1 C) [LJ]  1535 Lactic Acid, Venous(!!): 4.6 [LJ]  1535 We will give Klor-Con.  We will check a magnesium.  Potassium(!): 3.1 [LJ]  1537 Patient was discussed with the pharmacy team.  Based on her recent urine cultures they recommended that she receive meropenem for her symptoms.   [LJ]  2426 WBC(!): 16.2 [LJ]  1555 NEUT#(!): 15.1 [LJ]  1718 Lactic Acid, Venous(!!): 2.8 [LJ]  1719 IMPRESSION: 1. No evidence of urinary tract calculus or hydronephrosis. No perinephric inflammatory changes. 2. Colonic diverticulosis without evidence of diverticulitis. 3. Hepatic steatosis.    CT Renal Stone Study [LJ]  1852 Lactate improving.  Chest x-ray showing interstitial pulmonary edema.  Patient given 2 L of IV fluids.  Will hold additional fluids at this time.   [LJ]  1853 Bacteria, UA(!): RARE [LJ]  1903 Ketones, ur(!): 5 [LJ]  1903 Hgb urine dipstickMarland Kitchen): MODERATE [LJ]    Clinical Course User Index [LJ] Rayna Sexton, PA-C   MDM Rules/Calculators/A&P                          Patient is a 69 year old female who presents today due to fevers as well as nausea and vomiting.  Patient has had multiple recent admissions for pyelonephritis as well as urosepsis.  Patient initially  febrile at 102.3 Fahrenheit.  Labs obtained in triage with an elevated lactic acid at 4.6.  Patient endorsing suprapubic tenderness as well as urinary urgency.  Code sepsis initiated.  Patient given 2 L of IV fluids.  Patient started on meropenem based on recommendations from pharmacy.  I refrained from a third liter of IV fluid given her chest x-ray above which showed interstitial pulmonary edema.  Patient's blood pressures been stable.  Not tachycardic.  I obtained a BNP which is only mildly elevated at 153.7.  Lactic acid has improved to 2.8.  Respiratory panel is negative.  CBC with a  leukocytosis of 16.2 and neutrophilia of 15.1.  Potassium at 3.1 which was repleted with Klor-Con.  UA was fortunately reassuring with moderate hemoglobin, five ketones, rare bacteria.  No nitrites or leukocytes.  No elevation in white blood cells.  CT renal stone study obtained which was reassuring.  No evidence of calculus or hydronephrosis.  No perinephric inflammatory changes.  Given patient's recent sepsis admission as well as her meeting sepsis criteria today, feel that admission is necessary for monitoring and further work-up.  This was discussed with the patient and she is amenable.  Will discuss with the hospitalist team for likely admission.  Respiratory panel ordered and is negative.  Final Clinical Impression(s) / ED Diagnoses Final diagnoses:  Sepsis, due to unspecified organism, unspecified whether acute organ dysfunction present [facility])    Rx / DC Orders ED Discharge Orders    None       Rayna Sexton, PA-C 05/21/20 1955    Blanchie Dessert, MD 05/25/20 0900

## 2020-05-21 NOTE — Sepsis Progress Note (Signed)
Elink monitoring this Code Sepsis. 

## 2020-05-21 NOTE — ED Notes (Signed)
ED TO INPATIENT HANDOFF REPORT  Name/Age/Gender Brittany Middleton 69 y.o. female  Code Status    Code Status Orders  (From admission, onward)         Start     Ordered   05/21/20 2035  Full code  Continuous        05/21/20 2036        Code Status History    Date Active Date Inactive Code Status Order ID Comments User Context   04/25/2020 2119 04/29/2020 1937 Full Code [id number]  Edmonia Lynch, DO Inpatient   03/31/2020 1310 04/02/2020 1559 Full Code [id number]  Kayleen Memos, DO Inpatient   07/16/2018 2001 07/19/2018 1742 Full Code [id number]  Etta Quill, DO ED   02/27/2018 2143 02/28/2018 2134 Full Code [id number]  Bethena Roys, MD Inpatient   Advance Care Planning Activity    Advance Directive Documentation     Most Recent Value  Type of Advance Directive Healthcare Power of Attorney, Living will  Pre-existing out of facility DNR order (yellow form or pink MOST form) --  "MOST" Form in Place? --      Home/SNF/Other Home  Chief Complaint Severe sepsis ([place]) [A41.9, R65.20]  Level of Care/Admitting Diagnosis ED Disposition    ED Disposition Condition [place]: [facility] [[id number]]  Level of Care: Med-Surg [16]  May admit patient to [facility] or [facility] if equivalent level of care is available:: Yes  Covid Evaluation: Asymptomatic Screening Protocol (No Symptoms)  Diagnosis: Severe sepsis [facility]) [[id number]]  Admitting Physician: Lenore Cordia [[id number]]  Attending Physician: Lenore Cordia [[id number]]  Estimated length of stay: past midnight tomorrow  Certification:: I certify this patient will need inpatient services for at least 2 midnights       Medical History Past Medical History:  Diagnosis Date  . Acute blood loss anemia 07/17/2018  . Anemia 07/17/2018  . Ascending aortic aneurysm ([place]) 05/23/2019  . Asthma   . Cervical disc disease    MRI 2016  . Chest pain with moderate risk for cardiac  etiology 02/27/2018  . 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 Gastrointestinal bleeding 07/25/2018  . Gastrointestinal hemorrhage with melena 07/16/2018  . GERD (gastroesophageal reflux disease)   . Hepatic steatosis    per imaging 2016, 2019  . Hiatal hernia   . History of palpitations    Holter monitor, 2017: NSR, occasional PVC, PACs, no arrhythmia  . HLD (hyperlipidemia) 02/27/2018  . HTN (hypertension) 02/27/2018  . Hypercholesteremia   . Hypercholesterolemia 04/07/2017  . Hypertension   . Hypertensive disorder 04/07/2017  . Hypokalemia 07/17/2018  . Lumbar disc disease    MRI, 2017   . Morbid obesity ([place]) 05/22/2018  . Overweight 03/03/2018  . Palpitations 08/19/2015  . Pyelonephritis 03/30/2020  . Sepsis secondary to UTI ([place]) 04/25/2020  . Severe sepsis with acute organ dysfunction ([place]) 04/25/2020  . Sleep apnea   . UTI (urinary tract infection)     Allergies Allergies  Allergen Reactions  . Ciprofloxacin Other (See Comments)    Caused PAIN IN HANDS   . Codeine Nausea And Vomiting  . Macrobid [Nitrofurantoin] Nausea And Vomiting    IV Location/Drains/Wounds Patient Lines/Drains/Airways Status    Active Line/Drains/Airways    Name Placement date Placement time Site Days   Peripheral IV 05/21/20 Left Antecubital 05/21/20  1558  Antecubital  less than 1   Peripheral IV 05/21/20 Right Antecubital 05/21/20  1558  Antecubital  less  than 1   External Urinary Catheter 04/25/20  2000  --  26          Labs/Imaging Results for orders placed or performed during the hospital encounter of 05/21/20 (from the past 48 hour(s))  Lactic acid, plasma     Status: Abnormal   Collection Time: 05/21/20  2:13 PM  Result Value Ref Range   Lactic Acid, Venous 4.6 (HH) 0.5 - 1.9 mmol/L    Comment: CRITICAL RESULT CALLED TO, READ BACK BY AND VERIFIED WITH: DOWD,P. RN @1501  05/21/20 BILLINGSLEY,L Performed at [facility], [place] [address], [place], [place] [address]   Comprehensive  metabolic panel     Status: Abnormal   Collection Time: 05/21/20  2:13 PM  Result Value Ref Range   Sodium 135 135 - 145 mmol/L   Potassium 3.1 (L) 3.5 - 5.1 mmol/L   Chloride 99 98 - 111 mmol/L   CO2 22 22 - 32 mmol/L   Glucose, Bld 154 (H) 70 - 99 mg/dL    Comment: Glucose reference range applies only to samples taken after fasting for at least 8 hours.   BUN 15 8 - 23 mg/dL   Creatinine, Ser 0.91 0.44 - 1.00 mg/dL   Calcium 8.4 (L) 8.9 - 10.3 mg/dL   Total Protein 6.6 6.5 - 8.1 g/dL   Albumin 3.6 3.5 - 5.0 g/dL   AST 53 (H) 15 - 41 U/L   ALT 55 (H) 0 - 44 U/L    Comment: RESULTS CONFIRMED BY MANUAL DILUTION   Alkaline Phosphatase 40 38 - 126 U/L   Total Bilirubin 1.5 (H) 0.3 - 1.2 mg/dL   GFR, Estimated >60 >60 mL/min    Comment: (NOTE) Calculated using the CKD-EPI Creatinine Equation (2021)    Anion gap 14 5 - 15    Comment: Performed at [facility], [place] [address] [place], [place] [address]  CBC with Differential     Status: Abnormal   Collection Time: 05/21/20  2:13 PM  Result Value Ref Range   WBC 16.2 (H) 4.0 - 10.5 K/uL   RBC 4.30 3.87 - 5.11 MIL/uL   Hemoglobin 12.8 12.0 - 15.0 g/dL   HCT 40.2 36 - 46 %   MCV 93.5 80.0 - 100.0 fL   MCH 29.8 26.0 - 34.0 pg   MCHC 31.8 30.0 - 36.0 g/dL   RDW 14.6 11.5 - 15.5 %   Platelets 183 150 - 400 K/uL   nRBC 0.0 0.0 - 0.2 %   Neutrophils Relative % 93 %   Neutro Abs 15.1 (H) 1.7 - 7.7 K/uL   Lymphocytes Relative 1 %   Lymphs Abs 0.2 (L) 0.7 - 4.0 K/uL   Monocytes Relative 4 %   Monocytes Absolute 0.6 0.1 - 1.0 K/uL   Eosinophils Relative 1 %   Eosinophils Absolute 0.1 0.0 - 0.5 K/uL   Basophils Relative 0 %   Basophils Absolute 0.1 0.0 - 0.1 K/uL   Immature Granulocytes 1 %   Abs Immature Granulocytes 0.13 (H) 0.00 - 0.07 K/uL    Comment: Performed at [facility], [place] [address] [place],  [address]  Protime-INR     Status: None   Collection Time: 05/21/20  3:45 PM   Result Value Ref Range   Prothrombin Time 15.0 11.4 - 15.2 seconds   INR 1.2 0.8 - 1.2    Comment: (NOTE) INR goal varies based on device and disease states. Performed at [facility], [place] [address], [place], [place] [address]   APTT     Status: None   Collection Time: 05/21/20  3:45 PM  Result Value Ref Range   aPTT 33 24 - 36 seconds    Comment: Performed at [facility], [place] [address], [place], [place] [address]  Magnesium     Status: None   Collection Time: 05/21/20  3:45 PM  Result Value Ref Range   Magnesium 1.7 1.7 - 2.4 mg/dL    Comment: Performed at [facility], [place] [address], [place] [address]  Brain natriuretic peptide     Status: Abnormal   Collection Time: 05/21/20  3:45 PM  Result Value Ref Range   B Natriuretic Peptide 153.7 (H) 0.0 - 100.0 pg/mL    Comment: Performed at [facility], [place] [address], [place], [place] [address]  Lactic acid, plasma     Status: Abnormal   Collection Time: 05/21/20  4:11 PM  Result Value Ref Range   Lactic Acid, Venous 2.8 (HH) 0.5 - 1.9 mmol/L    Comment: CRITICAL VALUE NOTED.  VALUE IS CONSISTENT WITH PREVIOUSLY REPORTED AND CALLED VALUE. Performed at [facility], [place] [address], [place], [place] [address]   Resp Panel by RT-PCR (Flu A&B, Covid) Nasopharyngeal Swab     Status: None   Collection Time: 05/21/20  5:30 PM   Specimen: Nasopharyngeal Swab; Nasopharyngeal(NP) swabs in vial transport medium  Result Value Ref Range   SARS Coronavirus 2 by RT PCR NEGATIVE NEGATIVE    Comment: (NOTE) SARS-CoV-2 target nucleic acids are NOT DETECTED.  The SARS-CoV-2 RNA is generally detectable in upper respiratory specimens during the acute phase of infection. The lowest concentration of SARS-CoV-2 viral copies this assay can detect is 138 copies/mL. A negative result does not preclude SARS-Cov-2 infection and should not be used  as the sole basis for treatment or other patient management decisions. A negative result may occur with  improper specimen collection/handling, submission of specimen other than nasopharyngeal swab, presence of viral mutation(s) within the areas targeted by this assay, and inadequate number of viral copies(<138 copies/mL). A negative result must be combined with clinical observations, patient history, and epidemiological information. The expected result is Negative.  Fact Sheet for Patients:  EntrepreneurPulse.com.au  Fact Sheet for Healthcare Providers:  IncredibleEmployment.be  This test is no t yet approved or cleared by the [place] FDA and  has been authorized for detection and/or diagnosis of SARS-CoV-2 by FDA under an Emergency Use Authorization (EUA). This EUA will remain  in effect (meaning this test can be used) for the duration of the COVID-19 declaration under Section 564(b)(1) of the Act, 21 U.S.C.section 360bbb-3(b)(1), unless the authorization is terminated  or revoked sooner.       Influenza A by PCR NEGATIVE NEGATIVE   Influenza B by PCR NEGATIVE NEGATIVE    Comment: (NOTE) The Xpert Xpress SARS-CoV-2/FLU/RSV plus assay is intended as an aid in the diagnosis of influenza from Nasopharyngeal swab specimens and should not be used as a sole basis for treatment. Nasal washings and aspirates are unacceptable for Xpert Xpress SARS-CoV-2/FLU/RSV testing.  Fact Sheet for Patients: EntrepreneurPulse.com.au  Fact Sheet for Healthcare Providers: IncredibleEmployment.be  This test is not yet approved or cleared by the [place] FDA and has been authorized for detection and/or diagnosis of SARS-CoV-2 by FDA under an Emergency Use Authorization (EUA). This EUA will remain in effect (meaning this test can be  used) for the duration of the COVID-19 declaration under Section 564(b)(1) of the Act,  21 U.S.C. section 360bbb-3(b)(1), unless the authorization is terminated or revoked.  Performed at [facility], [place] [address] [place], [place] [address]   Urinalysis, Routine w reflex microscopic Urine, Clean Catch     Status: Abnormal   Collection Time: 05/21/20  6:18 PM  Result Value Ref Range   Color, Urine YELLOW YELLOW   APPearance CLEAR CLEAR   Specific Gravity, Urine 1.021 1.005 - 1.030   pH 5.0 5.0 - 8.0   Glucose, UA NEGATIVE NEGATIVE mg/dL   Hgb urine dipstick MODERATE (A) NEGATIVE   Bilirubin Urine NEGATIVE NEGATIVE   Ketones, ur 5 (A) NEGATIVE mg/dL   Protein, ur NEGATIVE NEGATIVE mg/dL   Nitrite NEGATIVE NEGATIVE   Leukocytes,Ua NEGATIVE NEGATIVE   RBC / HPF 0-5 0 - 5 RBC/hpf   WBC, UA 0-5 0 - 5 WBC/hpf   Bacteria, UA RARE (A) NONE SEEN   Squamous Epithelial / LPF 0-5 0 - 5    Comment: Performed at [facility], [place] [address] [place], [place] [address]   DG Chest Port 1 View  Result Date: 05/21/2020 CLINICAL DATA:  Questionable sepsis multiple recent UTI and EXAM: PORTABLE CHEST 1 VIEW COMPARISON:  CT 04/27/2020 FINDINGS: Increased attenuation towards the lung bases likely attributable in part to body habitus. Mild diffuse interstitial opacity with vascular cephalization and congestion, cuffing, peripheral septal lines and fissural thickening. No visible pneumothorax or effusion. The aorta is calcified. The remaining cardiomediastinal contours are unremarkable. No acute osseous or soft tissue abnormality. Degenerative changes are present in the imaged spine and shoulders. Telemetry leads overlie the chest. IMPRESSION: Findings most suggestive of interstitial pulmonary edema. No focal consolidative process. Aortic Atherosclerosis (ICD10-I70.0). Electronically Signed   By: Lovena Le M.D.   On: 05/21/2020 16:23   CT Renal Stone Study  Result Date: 05/21/2020 CLINICAL DATA:  Right flank pain. EXAM: CT ABDOMEN AND PELVIS WITHOUT  CONTRAST TECHNIQUE: Multidetector CT imaging of the abdomen and pelvis was performed following the standard protocol without IV contrast. COMPARISON:  CT May 07, 2020. FINDINGS: Lower chest: No acute abnormality.  Dependent atelectasis. Hepatobiliary: No focal liver abnormality is seen. Diffuse low attenuation of the liver, suggestive of hepatic steatosis. No gallstones, gallbladder wall thickening, or biliary dilatation. Pancreas: Similar partial fatty replacement. No pancreatic ductal dilatation or surrounding inflammatory changes. Spleen: Normal in size without focal abnormality. Adrenals/Urinary Tract: Adrenal glands are unremarkable. Kidneys are normal, without renal calculi, focal lesion, or hydronephrosis. Bilateral ureters are nondilated without evidence of radiodense calculi. Bladder is decompressed, limiting evaluation. Stomach/Bowel: Stomach is within normal limits. Appendix appears normal. No evidence of bowel wall thickening, distention, or inflammatory changes. Colonic diverticuli without evidence of acute diverticulitis. Vascular/Lymphatic: Aortic atherosclerosis. No enlarged abdominal or pelvic lymph nodes. Reproductive: Uterus and bilateral adnexa are unremarkable. Other: No free fluid or free gas. Musculoskeletal: Multilevel degenerative disc disease in the visualized spine. IMPRESSION: 1. No evidence of urinary tract calculus or hydronephrosis. No perinephric inflammatory changes. 2. Colonic diverticulosis without evidence of diverticulitis. 3. Hepatic steatosis. Electronically Signed   By: Margaretha Sheffield MD   On: 05/21/2020 16:58    Pending Labs Unresulted Labs (From admission, onward)          Start     Ordered   05/22/20 0500  Magnesium  Tomorrow morning,   R        05/21/20 2036   05/22/20 0500  CBC  Tomorrow morning,   R        05/21/20 2036   05/22/20 2725  Basic metabolic panel  Tomorrow morning,   R        05/21/20 2036   05/21/20 1531  Blood Culture (routine x 2)   (Septic presentation on arrival (screening labs, nursing and treatment orders for obvious sepsis))  BLOOD CULTURE X 2,   STAT      05/21/20 1535   05/21/20 1531  Urine culture  (Septic presentation on arrival (screening labs, nursing and treatment orders for obvious sepsis))  ONCE - STAT,   STAT        05/21/20 1535          Vitals/Pain Today's Vitals   05/21/20 1915 05/21/20 1945 05/21/20 2017 05/21/20 2030  BP: (!) 119/56 (!) 112/44  (!) 143/102  Pulse: 76 74  73  Resp: (!) 21 (!) 21  (!) 25  Temp:      TempSrc:      SpO2: 93% 94%  94%  Weight:      Height:      PainSc:   2      Isolation Precautions No active isolations  Medications Medications  meropenem (MERREM) 1 g in sodium chloride 0.9 % 100 mL IVPB (0 g Intravenous Stopped 05/21/20 1654)  enoxaparin (LOVENOX) injection 40 mg (has no administration in time range)  acetaminophen (TYLENOL) tablet 650 mg (has no administration in time range)    Or  acetaminophen (TYLENOL) suppository 650 mg (has no administration in time range)  ondansetron (ZOFRAN) tablet 4 mg (has no administration in time range)    Or  ondansetron (ZOFRAN) injection 4 mg (has no administration in time range)  magnesium sulfate IVPB 1 g 100 mL (has no administration in time range)  lactated ringers bolus 1,000 mL (1,000 mLs Intravenous New Bag/Given 05/21/20 1605)  lactated ringers bolus 1,000 mL (1,000 mLs Intravenous New Bag/Given 05/21/20 1604)  acetaminophen (TYLENOL) tablet 1,000 mg (1,000 mg Oral Given 05/21/20 1557)  potassium chloride (KLOR-CON) CR tablet 50 mEq (50 mEq Oral Given 05/21/20 1555)    Mobility walks

## 2020-05-21 NOTE — ED Triage Notes (Addendum)
Patient reports known UTI with two admissions in the last month for same. Reports N/V since last night. States seen at urology yesterday and prescribed macrobid. States last time she took macrobid she had same symptoms.  Endorses taking tylenol PTA.

## 2020-05-21 NOTE — Telephone Encounter (Signed)
See note to md.

## 2020-05-21 NOTE — ED Notes (Signed)
Per RN on 5W the [facility] stated that this patient was assigned to the wrong room

## 2020-05-21 NOTE — ED Notes (Signed)
Pt assisted to [facility]. Pt requesting for IV in RAC to be removed due to pain, RN assessed patency on LAC IV and IV is working well so RAC was removed. Pt repositioned in bed and call bell placed within reach.

## 2020-05-21 NOTE — Sepsis Progress Note (Signed)
Notified provider of need to order fluid bolus.  ?

## 2020-05-22 ENCOUNTER — Encounter: Payer: Self-pay | Admitting: Urology

## 2020-05-22 DIAGNOSIS — A419 Sepsis, unspecified organism: Secondary | ICD-10-CM | POA: Diagnosis not present

## 2020-05-22 DIAGNOSIS — R6520 Severe sepsis without septic shock: Secondary | ICD-10-CM | POA: Diagnosis not present

## 2020-05-22 LAB — BASIC METABOLIC PANEL
Anion gap: 9 (ref 5–15)
BUN: 15 mg/dL (ref 8–23)
CO2: 25 mmol/L (ref 22–32)
Calcium: 8.2 mg/dL — ABNORMAL LOW (ref 8.9–10.3)
Chloride: 103 mmol/L (ref 98–111)
Creatinine, Ser: 0.68 mg/dL (ref 0.44–1.00)
GFR, Estimated: >60 mL/min (ref >60)
Glucose, Bld: 108 mg/dL — ABNORMAL HIGH (ref 70–99)
Potassium: 3.6 mmol/L (ref 3.5–5.1)
Sodium: 137 mmol/L (ref 135–145)

## 2020-05-22 LAB — CBC
HCT: 35.4 % — ABNORMAL LOW (ref 36.0–46.0)
Hemoglobin: 11.1 g/dL — ABNORMAL LOW (ref 12.0–15.0)
MCH: 29.6 pg (ref 26.0–34.0)
MCHC: 31.4 g/dL (ref 30.0–36.0)
MCV: 94.4 fL (ref 80.0–100.0)
Platelets: 140 10*3/uL — ABNORMAL LOW (ref 150–400)
RBC: 3.75 MIL/uL — ABNORMAL LOW (ref 3.87–5.11)
RDW: 14.8 % (ref 11.5–15.5)
WBC: 9.8 10*3/uL (ref 4.0–10.5)
nRBC: 0 % (ref 0.0–0.2)

## 2020-05-22 LAB — URINE CULTURE

## 2020-05-22 LAB — MAGNESIUM: Magnesium: 2 mg/dL (ref 1.7–2.4)

## 2020-05-22 NOTE — Progress Notes (Signed)
Pts. home CPAP s/u, machine good condition, currently on room air, made aware to notify if needed.

## 2020-05-22 NOTE — ED Notes (Signed)
Waiting for doctor to leave room to take pt upstairs.

## 2020-05-22 NOTE — Progress Notes (Signed)
Patient arrived to the floor from ER around 0930. Initiated contact precautions for ESBL in urine, patient educated on the precautions. Home CPAP machine with patient. Son Rodman Key) present in room. Patient c/o very mild pain to lower abdomen she is aware that PRN tylenol is not due until around 1230, she is okay with that. Hydration at bedside. Vitals stable. Provided with menu she is ordering a meal. No complaints other wise.

## 2020-05-22 NOTE — ED Notes (Signed)
Report called to Brittany RN

## 2020-05-22 NOTE — Progress Notes (Signed)
PROGRESS NOTE    Brittany Middleton  TXM:[id number] DOB: 01/23/1951 DOA: 05/21/2020 PCP: London Pepper, MD    Brief Narrative:  69 year old female with history of asthma, hypertension, hyperlipidemia, coronary artery disease, obstructive sleep apnea presented to the ER with fever and recurrent UTIs.  Reported multiple episodes of UTI including pyelonephritis and sepsis due to UTI.  Recent admission 11/5-11/9.  Treated empirically with ceftriaxone.  11/17 she was seen in ER for acute cystitis, had ESBL E. coli and treated with fosfomycin.  Seen by urology on 11/30 and started nitrofurantoin.  Patient continues to have fever, she did not tolerate nitrofurantoin so came to ER.   Assessment & Plan:   Principal Problem:   Severe sepsis ([place]) Active Problems:   HTN (hypertension)   HLD (hyperlipidemia)   Hypokalemia   Urinary tract infection due to extended-spectrum beta lactamase (ESBL) producing Escherichia coli   Asthma   Sleep apnea  Severe sepsis: Secondary to UTI.  Presumed ESBL E. coli only sensitive to Carbapenem's.  Currently hemodynamically stable.  Resuscitated. CT scan of the abdomen pelvis with no urogenital abnormality, no hydronephrosis or structural problems. Urine cultures and blood cultures pending. Currently remains on meropenem. Probably partially treated UTI. Patient with fourth episode of UTI in 2 months, will benefit with chronic maintenance antibiotic therapy, however she started having nausea vomiting with nitrofurantoin.  Once patient is clinically stable, will challenge with low-dose nitrofurantoin for chronic suppressive therapy.  Followed by urology as outpatient. Will check post void residual.  Advised double voiding.  Hypokalemia: Replaced.  Adequate.  Hypertension: Blood pressure stable.  Holding hydrochlorothiazide.  Mild intermittent asthma: Stable.  Hyperlipidemia: On Crestor.  Continue.  Coronary artery disease: On Plavix.  Obstructive sleep  apnea: Using her own CPAP.   DVT prophylaxis: Lovenox subcu   Code Status: Inpatient Family Communication: Son at the bedside Disposition Plan: Status is: Inpatient  Remains inpatient appropriate because:Inpatient level of care appropriate due to severity of illness   Dispo: The patient is from: Home              Anticipated d/c is to: Home              Anticipated d/c date is: 2 days              Patient currently is not medically stable to d/c.         Consultants:   None  Procedures:   None  Antimicrobials:   Meropenem, 12/1----   Subjective: Patient seen and examined.  She was still in the emergency room in the morning rounds.  Her son was at the bedside.  Overnight temperature 102.  Patient has mild suprapubic pain but no other complaints. Multiple questions were answered for the patient and her son.  We discussed about different antibiotic regimen as well as causes of UTI and treatment.  Objective: Vitals:   05/22/20 0743 05/22/20 0800 05/22/20 0900 05/22/20 0940  BP: 114/64 117/60 128/71 107/81  Pulse: 62 (!) 58 63 61  Resp: 15 (!) 21 (!) 27 20  Temp: 98 F (36.7 C)   98.2 F (36.8 C)  TempSrc: Oral   Oral  SpO2: 92% 91% 92% 94%  Weight:      Height:        Intake/Output Summary (Last 24 hours) at 05/22/2020 1248 Last data filed at 05/22/2020 0155 Gross per 24 hour  Intake 2200 ml  Output --  Net 2200 ml   [employer]  05/21/20 1834  Weight: 118.4 kg    Examination:  General exam: Appears calm and comfortable, looks comfortable on room air. Respiratory system: Clear to auscultation. Respiratory effort normal.  No added sounds. Cardiovascular system: S1 & S2 heard, RRR. No JVD, murmurs, rubs, gallops or clicks.  Trace pedal edema. Gastrointestinal system: Abdomen is nondistended, soft and nontender. No organomegaly or masses felt. Normal bowel sounds heard. Central nervous system: Alert and oriented. No focal neurological  deficits. Extremities: Symmetric 5 x 5 power. Skin: No rashes, lesions or ulcers Psychiatry: Judgement and insight appear normal. Mood & affect appropriate.     Data Reviewed: I have personally reviewed following labs and imaging studies  CBC: Recent Labs  Lab 05/21/20 1413 05/22/20 0310  WBC 16.2* 9.8  NEUTROABS 15.1*  --   HGB 12.8 11.1*  HCT 40.2 35.4*  MCV 93.5 94.4  PLT 183 416*   Basic Metabolic Panel: Recent Labs  Lab 05/21/20 1413 05/21/20 1545 05/22/20 0310  NA 135  --  137  K 3.1*  --  3.6  CL 99  --  103  CO2 22  --  25  GLUCOSE 154*  --  108*  BUN 15  --  15  CREATININE 0.91  --  0.68  CALCIUM 8.4*  --  8.2*  MG  --  1.7 2.0   GFR: Estimated Creatinine Clearance: 87.9 mL/min (by C-G formula based on SCr of 0.68 mg/dL). Liver Function Tests: Recent Labs  Lab 05/21/20 1413  AST 53*  ALT 55*  ALKPHOS 40  BILITOT 1.5*  PROT 6.6  ALBUMIN 3.6   No results for input(s): LIPASE, AMYLASE in the last 168 hours. No results for input(s): AMMONIA in the last 168 hours. Coagulation Profile: Recent Labs  Lab 05/21/20 1545  INR 1.2   Cardiac Enzymes: No results for input(s): CKTOTAL, CKMB, CKMBINDEX, TROPONINI in the last 168 hours. BNP (last 3 results) No results for input(s): PROBNP in the last 8760 hours. HbA1C: No results for input(s): HGBA1C in the last 72 hours. CBG: No results for input(s): GLUCAP in the last 168 hours. Lipid Profile: No results for input(s): CHOL, HDL, LDLCALC, TRIG, CHOLHDL, LDLDIRECT in the last 72 hours. Thyroid Function Tests: No results for input(s): TSH, T4TOTAL, FREET4, T3FREE, THYROIDAB in the last 72 hours. Anemia Panel: No results for input(s): VITAMINB12, FOLATE, FERRITIN, TIBC, IRON, RETICCTPCT in the last 72 hours. Sepsis Labs: Recent Labs  Lab 05/21/20 1413 05/21/20 1611  LATICACIDVEN 4.6* 2.8*    Recent Results (from the past 240 hour(s))  Microscopic Examination     Status: Abnormal   Collection  Time: 05/20/20  2:22 PM   Urine  Result Value Ref Range Status   WBC, UA 0-5 0 - 5 /hpf Final   RBC 3-10 (A) 0 - 2 /hpf Final   Epithelial Cells (non renal) 0-10 0 - 10 /hpf Final   Renal Epithel, UA None seen None seen /hpf Final   Bacteria, UA Few (A) None seen/Few Final  Blood Culture (routine x 2)     Status: None (Preliminary result)   Collection Time: 05/21/20  3:31 PM   Specimen: BLOOD  Result Value Ref Range Status   Specimen Description   Final    BLOOD LEFT ANTECUBITAL Performed at [facility], [place] [address], [place], [place] [address]    Special Requests   Final    BOTTLES DRAWN AEROBIC AND ANAEROBIC Blood Culture adequate volume Performed at [facility],  [place] [address], [place], [place] [address]    Culture   Final    NO GROWTH < 24 HOURS Performed at [facility] [address] [place], [place] [address]    Report Status PENDING  Incomplete  Blood Culture (routine x 2)     Status: None (Preliminary result)   Collection Time: 05/21/20  3:45 PM   Specimen: BLOOD  Result Value Ref Range Status   Specimen Description   Final    BLOOD RIGHT ANTECUBITAL Performed at [facility] [address], [place], [place] [address]    Special Requests   Final    BOTTLES DRAWN AEROBIC AND ANAEROBIC Blood Culture results may not be optimal due to an excessive volume of blood received in culture bottles Performed at [facility] [address] [place], [place] [address]    Culture   Final    NO GROWTH < 24 HOURS Performed at [facility] [address] [place], [place] [address]    Report Status PENDING  Incomplete  Resp Panel by RT-PCR (Flu A&B, Covid) Nasopharyngeal Swab     Status: None   Collection Time: 05/21/20  5:30 PM   Specimen: Nasopharyngeal Swab; Nasopharyngeal(NP) swabs in vial transport medium  Result Value Ref Range Status   SARS Coronavirus 2 by RT PCR NEGATIVE  NEGATIVE Final    Comment: (NOTE) SARS-CoV-2 target nucleic acids are NOT DETECTED.  The SARS-CoV-2 RNA is generally detectable in upper respiratory specimens during the acute phase of infection. The lowest concentration of SARS-CoV-2 viral copies this assay can detect is 138 copies/mL. A negative result does not preclude SARS-Cov-2 infection and should not be used as the sole basis for treatment or other patient management decisions. A negative result may occur with  improper specimen collection/handling, submission of specimen other than nasopharyngeal swab, presence of viral mutation(s) within the areas targeted by this assay, and inadequate number of viral copies(<138 copies/mL). A negative result must be combined with clinical observations, patient history, and epidemiological information. The expected result is Negative.  Fact Sheet for Patients:  EntrepreneurPulse.com.au  Fact Sheet for Healthcare Providers:  IncredibleEmployment.be  This test is no t yet approved or cleared by the [place] FDA and  has been authorized for detection and/or diagnosis of SARS-CoV-2 by FDA under an Emergency Use Authorization (EUA). This EUA will remain  in effect (meaning this test can be used) for the duration of the COVID-19 declaration under Section 564(b)(1) of the Act, 21 U.S.C.section 360bbb-3(b)(1), unless the authorization is terminated  or revoked sooner.       Influenza A by PCR NEGATIVE NEGATIVE Final   Influenza B by PCR NEGATIVE NEGATIVE Final    Comment: (NOTE) The Xpert Xpress SARS-CoV-2/FLU/RSV plus assay is intended as an aid in the diagnosis of influenza from Nasopharyngeal swab specimens and should not be used as a sole basis for treatment. Nasal washings and aspirates are unacceptable for Xpert Xpress SARS-CoV-2/FLU/RSV testing.  Fact Sheet for Patients: EntrepreneurPulse.com.au  Fact Sheet for Healthcare  Providers: IncredibleEmployment.be  This test is not yet approved or cleared by the [place] FDA and has been authorized for detection and/or diagnosis of SARS-CoV-2 by FDA under an Emergency Use Authorization (EUA). This EUA will remain in effect (meaning this test can be used) for the duration of the COVID-19 declaration under Section 564(b)(1) of the Act, 21 U.S.C. section 360bbb-3(b)(1), unless the authorization is terminated or revoked.  Performed at [facility]  [place] [address], [place], [place] [address]          Radiology Studies: DG Chest Port 1 View  Result Date: 05/21/2020 CLINICAL DATA:  Questionable sepsis multiple recent UTI and EXAM: PORTABLE CHEST 1 VIEW COMPARISON:  CT 04/27/2020 FINDINGS: Increased attenuation towards the lung bases likely attributable in part to body habitus. Mild diffuse interstitial opacity with vascular cephalization and congestion, cuffing, peripheral septal lines and fissural thickening. No visible pneumothorax or effusion. The aorta is calcified. The remaining cardiomediastinal contours are unremarkable. No acute osseous or soft tissue abnormality. Degenerative changes are present in the imaged spine and shoulders. Telemetry leads overlie the chest. IMPRESSION: Findings most suggestive of interstitial pulmonary edema. No focal consolidative process. Aortic Atherosclerosis (ICD10-I70.0). Electronically Signed   By: Lovena Le M.D.   On: 05/21/2020 16:23   CT Renal Stone Study  Result Date: 05/21/2020 CLINICAL DATA:  Right flank pain. EXAM: CT ABDOMEN AND PELVIS WITHOUT CONTRAST TECHNIQUE: Multidetector CT imaging of the abdomen and pelvis was performed following the standard protocol without IV contrast. COMPARISON:  CT May 07, 2020. FINDINGS: Lower chest: No acute abnormality.  Dependent atelectasis. Hepatobiliary: No focal liver abnormality is seen. Diffuse low attenuation of the liver, suggestive  of hepatic steatosis. No gallstones, gallbladder wall thickening, or biliary dilatation. Pancreas: Similar partial fatty replacement. No pancreatic ductal dilatation or surrounding inflammatory changes. Spleen: Normal in size without focal abnormality. Adrenals/Urinary Tract: Adrenal glands are unremarkable. Kidneys are normal, without renal calculi, focal lesion, or hydronephrosis. Bilateral ureters are nondilated without evidence of radiodense calculi. Bladder is decompressed, limiting evaluation. Stomach/Bowel: Stomach is within normal limits. Appendix appears normal. No evidence of bowel wall thickening, distention, or inflammatory changes. Colonic diverticuli without evidence of acute diverticulitis. Vascular/Lymphatic: Aortic atherosclerosis. No enlarged abdominal or pelvic lymph nodes. Reproductive: Uterus and bilateral adnexa are unremarkable. Other: No free fluid or free gas. Musculoskeletal: Multilevel degenerative disc disease in the visualized spine. IMPRESSION: 1. No evidence of urinary tract calculus or hydronephrosis. No perinephric inflammatory changes. 2. Colonic diverticulosis without evidence of diverticulitis. 3. Hepatic steatosis. Electronically Signed   By: Margaretha Sheffield MD   On: 05/21/2020 16:58        Scheduled Meds: . budesonide (PULMICORT) nebulizer solution  0.25 mg Nebulization BID  . clopidogrel  75 mg Oral Q supper  . enoxaparin (LOVENOX) injection  60 mg Subcutaneous Q24H  . losartan  50 mg Oral Daily  . pantoprazole  40 mg Oral Daily  . rosuvastatin  20 mg Oral QPC supper   Continuous Infusions: . meropenem (MERREM) IV 1 g (05/22/20 0752)     LOS: 1 day    Time spent: 35 minutes    Barb Merino, MD [facility] Pager [phone]

## 2020-05-23 ENCOUNTER — Encounter: Payer: Self-pay | Admitting: Urology

## 2020-05-23 DIAGNOSIS — R6520 Severe sepsis without septic shock: Secondary | ICD-10-CM | POA: Diagnosis not present

## 2020-05-23 DIAGNOSIS — A419 Sepsis, unspecified organism: Secondary | ICD-10-CM | POA: Diagnosis not present

## 2020-05-23 MED ORDER — NITROFURANTOIN MACROCRYSTAL 50 MG PO CAPS
50.0000 mg | ORAL_CAPSULE | Freq: Once | ORAL | Status: AC
  Administered 2020-05-23: 50 mg via ORAL
  Filled 2020-05-23: qty 1

## 2020-05-23 MED ORDER — PROSOURCE PLUS PO LIQD
30.0000 mL | Freq: Two times a day (BID) | ORAL | Status: DC
  Administered 2020-05-23 – 2020-05-24 (×2): 30 mL via ORAL
  Filled 2020-05-23 (×2): qty 30

## 2020-05-23 MED ORDER — POLYETHYLENE GLYCOL 3350 17 G PO PACK: 17.0000 g | PACK | Freq: Every day | ORAL | Status: DC

## 2020-05-23 MED ORDER — NITROFURANTOIN 25 MG/5ML PO SUSP: 25.0000 mg | Freq: Once | ORAL | Status: DC

## 2020-05-23 MED ORDER — RISAQUAD PO CAPS
1.0000 | ORAL_CAPSULE | Freq: Every day | ORAL | Status: DC
  Administered 2020-05-23 – 2020-05-24 (×2): 1 via ORAL
  Filled 2020-05-23 (×2): qty 1

## 2020-05-23 NOTE — Plan of Care (Signed)
  Problem: Education: Goal: Knowledge of General Education information will improve Description: Including pain rating scale, medication(s)/side effects and non-pharmacologic comfort measures Outcome: Progressing   Problem: Health Behavior/Discharge Planning: Goal: Ability to manage health-related needs will improve Outcome: Progressing   Problem: Clinical Measurements: Goal: Ability to maintain clinical measurements within normal limits will improve Outcome: Progressing Goal: Will remain free from infection Outcome: Progressing Goal: Diagnostic test results will improve Outcome: Progressing Goal: Respiratory complications will improve Outcome: Progressing Goal: Cardiovascular complication will be avoided Outcome: Progressing   Problem: Nutrition: Goal: Adequate nutrition will be maintained Outcome: Progressing   Problem: Coping: Goal: Level of anxiety will decrease Outcome: Progressing   Problem: Elimination: Goal: Will not experience complications related to bowel motility Outcome: Progressing Goal: Will not experience complications related to urinary retention Outcome: Progressing   Problem: Pain Managment: Goal: General experience of comfort will improve Outcome: Progressing   Problem: Safety: Goal: Ability to remain free from injury will improve Outcome: Progressing   Problem: Urinary Elimination: Goal: Signs and symptoms of infection will decrease Outcome: Progressing   Problem: Skin Integrity: Goal: Risk for impaired skin integrity will decrease Outcome: Progressing

## 2020-05-23 NOTE — Progress Notes (Signed)
PROGRESS NOTE    Brittany Middleton  AJO:[id number] DOB: 1951/04/09 DOA: 05/21/2020 PCP: London Pepper, MD    Brief Narrative:  69 year old female with history of asthma, hypertension, hyperlipidemia, coronary artery disease, obstructive sleep apnea presented to the ER with fever and recurrent UTIs.  Reported multiple episodes of UTI including pyelonephritis and sepsis due to UTI.  Recent admission 11/5-11/9.  Treated empirically with ceftriaxone.  11/17 she was seen in ER for acute cystitis, had ESBL E. coli and treated with fosfomycin.  Seen by urology on 11/30 and started nitrofurantoin.  Patient continues to have fever, she did not tolerate nitrofurantoin so came to ER. Urine culture from 11/17 ESBL E. Coli Urine culture from 11/30 pansensitive E. Coli Urine culture from 12/1, pending.  Blood cultures pending.   Assessment & Plan:   Principal Problem:   Severe sepsis ([place]) Active Problems:   HTN (hypertension)   HLD (hyperlipidemia)   Hypokalemia   Urinary tract infection due to extended-spectrum beta lactamase (ESBL) producing Escherichia coli   Asthma   Sleep apnea  Severe sepsis: Secondary to UTI.  Presumed ESBL E. coli only sensitive to Carbapenem's.  Currently hemodynamically stable.  CT scan of the abdomen pelvis with no urogenital abnormality, no hydronephrosis or structural problems. Urine cultures and blood cultures pending. Currently remains on meropenem.  Continue. Probably partially treated UTI. Patient with fourth episode of UTI in 2 months, will benefit with chronic maintenance antibiotic therapy, however she started having nausea vomiting with nitrofurantoin. Not sure her nausea was from nitrofurantoin or from UTI.  I discussed with patient that we can try a small dose of nitrofurantoin today and see if she has recurrence of symptoms.  We will continue meropenem today.  If clinically stable, discharge home tomorrow with 2 additional doses of fosfomycin.  If she  tolerates nitrofurantoin, will put her on long-term maintenance therapy.  No evidence of post void residual.  Hypokalemia: Replaced.  Adequate.  Hypertension: Blood pressure stable.  Holding hydrochlorothiazide.  Mild intermittent asthma: Stable.  Hyperlipidemia: On Crestor.  Continue.  Coronary artery disease: On Plavix.  Obstructive sleep apnea: Using her own CPAP.  Blood mixed stool: Patient had specks of blood in her stool.  None diverticulosis.  Will avoid constipation.   DVT prophylaxis: Lovenox subcu   Code Status: Inpatient Family Communication: No family at the bedside today. Disposition Plan: Status is: Inpatient  Remains inpatient appropriate because:Inpatient level of care appropriate due to severity of illness   Dispo: The patient is from: Home              Anticipated d/c is to: Home              Anticipated d/c date is: Architectural technologist.              Patient currently is not medically stable to d/c.         Consultants:   None  Procedures:   None  Antimicrobials:   Meropenem, 12/1----  Nitrofurantoin 50 mg 1 dose 12/3   Subjective: Patient seen and examined.  No overnight events.  He still has suprapubic discomfort otherwise mostly improved. Discussed about trying half dose of nitrofurantoin today and she is agreeable to take it after lunch.  Objective: Vitals:   05/22/20 2103 05/23/20 0002 05/23/20 0559 05/23/20 0907  BP:  (!) 111/57 126/67   Pulse:  62 (!) 58   Resp:  16 18   Temp:  98.2 F (36.8 C) 98.5 F (36.9 C)  TempSrc:      SpO2: 96% 94% 93% 95%  Weight:      Height:        Intake/Output Summary (Last 24 hours) at 05/23/2020 1140 Last data filed at 05/23/2020 1000 Gross per 24 hour  Intake 1695.37 ml  Output 3450 ml  Net -1754.63 ml   Filed Weights   05/21/20 1834  Weight: 118.4 kg    Examination:  General exam: Appears calm and comfortable, looks comfortable on room air. Respiratory system: Clear to auscultation.  Respiratory effort normal.  No added sounds. Cardiovascular system: S1 & S2 heard, RRR. No JVD, murmurs, rubs, gallops or clicks.  Trace pedal edema. Gastrointestinal system: Abdomen is nondistended, soft and nontender. No organomegaly or masses felt. Normal bowel sounds heard. Central nervous system: Alert and oriented. No focal neurological deficits. Extremities: Symmetric 5 x 5 power. Skin: No rashes, lesions or ulcers Psychiatry: Judgement and insight appear normal. Mood & affect appropriate.     Data Reviewed: I have personally reviewed following labs and imaging studies  CBC: Recent Labs  Lab 05/21/20 1413 05/22/20 0310  WBC 16.2* 9.8  NEUTROABS 15.1*  --   HGB 12.8 11.1*  HCT 40.2 35.4*  MCV 93.5 94.4  PLT 183 798*   Basic Metabolic Panel: Recent Labs  Lab 05/21/20 1413 05/21/20 1545 05/22/20 0310  NA 135  --  137  K 3.1*  --  3.6  CL 99  --  103  CO2 22  --  25  GLUCOSE 154*  --  108*  BUN 15  --  15  CREATININE 0.91  --  0.68  CALCIUM 8.4*  --  8.2*  MG  --  1.7 2.0   GFR: Estimated Creatinine Clearance: 87.9 mL/min (by C-G formula based on SCr of 0.68 mg/dL). Liver Function Tests: Recent Labs  Lab 05/21/20 1413  AST 53*  ALT 55*  ALKPHOS 40  BILITOT 1.5*  PROT 6.6  ALBUMIN 3.6   No results for input(s): LIPASE, AMYLASE in the last 168 hours. No results for input(s): AMMONIA in the last 168 hours. Coagulation Profile: Recent Labs  Lab 05/21/20 1545  INR 1.2   Cardiac Enzymes: No results for input(s): CKTOTAL, CKMB, CKMBINDEX, TROPONINI in the last 168 hours. BNP (last 3 results) No results for input(s): PROBNP in the last 8760 hours. HbA1C: No results for input(s): HGBA1C in the last 72 hours. CBG: No results for input(s): GLUCAP in the last 168 hours. Lipid Profile: No results for input(s): CHOL, HDL, LDLCALC, TRIG, CHOLHDL, LDLDIRECT in the last 72 hours. Thyroid Function Tests: No results for input(s): TSH, T4TOTAL, FREET4, T3FREE,  THYROIDAB in the last 72 hours. Anemia Panel: No results for input(s): VITAMINB12, FOLATE, FERRITIN, TIBC, IRON, RETICCTPCT in the last 72 hours. Sepsis Labs: Recent Labs  Lab 05/21/20 1413 05/21/20 1611  LATICACIDVEN 4.6* 2.8*    Recent Results (from the past 240 hour(s))  Urine Culture     Status: Abnormal   Collection Time: 05/20/20 12:00 AM   Specimen: Urine, Clean Catch   Urine  Release to pati  Result Value Ref Range Status   Urine Culture, Routine Final report (A)  Final   Organism ID, Bacteria Escherichia coli (A)  Final    Comment: 50,000-100,000 colony forming units per mL Cefazolin with an MIC <=16 predicts susceptibility to the oral agents cefaclor, cefdinir, cefpodoxime, cefprozil, cefuroxime, cephalexin, and loracarbef when used for therapy of uncomplicated urinary tract infections due to E. coli, Klebsiella pneumoniae, and  Proteus mirabilis.    ORGANISM ID, BACTERIA Comment  Final    Comment: Mixed urogenital flora 10,000-25,000 colony forming units per mL    Antimicrobial Susceptibility Comment  Final    Comment:       ** S = Susceptible; I = Intermediate; R = Resistant **                    P = Positive; N = Negative             MICS are expressed in micrograms per mL    Antibiotic                 RSLT#1    RSLT#2    RSLT#3    RSLT#4 Amoxicillin/Clavulanic Acid    S Ampicillin                     R Cefazolin                      S Cefepime                       S Ceftriaxone                    S Cefuroxime                     S Ciprofloxacin                  S Ertapenem                      S Gentamicin                     S Imipenem                       S Levofloxacin                   S Meropenem                      S Nitrofurantoin                 S Piperacillin/Tazobactam        S Tetracycline                   S Tobramycin                     S Trimethoprim/Sulfa             S   Microscopic Examination     Status: Abnormal   Collection  Time: 05/20/20  2:22 PM   Urine  Result Value Ref Range Status   WBC, UA 0-5 0 - 5 /hpf Final   RBC 3-10 (A) 0 - 2 /hpf Final   Epithelial Cells (non renal) 0-10 0 - 10 /hpf Final   Renal Epithel, UA None seen None seen /hpf Final   Bacteria, UA Few (A) None seen/Few Final  Blood Culture (routine x 2)     Status: None (Preliminary result)   Collection Time: 05/21/20  3:31 PM   Specimen: BLOOD  Result Value Ref Range Status   Specimen Description   Final    BLOOD LEFT ANTECUBITAL Performed at [facility], [place] [address] [place], [place] [address]  Special Requests   Final    BOTTLES DRAWN AEROBIC AND ANAEROBIC Blood Culture adequate volume Performed at [place] [address], [place], [place] [address]    Culture   Final    NO GROWTH 2 DAYS Performed at [place] [address], [place], [place] [address]    Report Status PENDING  Incomplete  Blood Culture (routine x 2)     Status: None (Preliminary result)   Collection Time: 05/21/20  3:45 PM   Specimen: BLOOD  Result Value Ref Range Status   Specimen Description   Final    BLOOD RIGHT ANTECUBITAL Performed at [place] [address] [place], [place] [address]    Special Requests   Final    BOTTLES DRAWN AEROBIC AND ANAEROBIC Blood Culture results may not be optimal due to an excessive volume of blood received in culture bottles Performed at [place] [address] [place], [place] [address]    Culture   Final    NO GROWTH 2 DAYS Performed at [place] [address] [place], [place] [address]    Report Status PENDING  Incomplete  Resp Panel by RT-PCR (Flu A&B, Covid) Nasopharyngeal Swab     Status: None   Collection Time: 05/21/20  5:30 PM   Specimen: Nasopharyngeal Swab; Nasopharyngeal(NP) swabs in vial transport medium  Result Value Ref Range Status   SARS Coronavirus 2 by RT PCR NEGATIVE NEGATIVE  Final    Comment: (NOTE) SARS-CoV-2 target nucleic acids are NOT DETECTED.  The SARS-CoV-2 RNA is generally detectable in upper respiratory specimens during the acute phase of infection. The lowest concentration of SARS-CoV-2 viral copies this assay can detect is 138 copies/mL. A negative result does not preclude SARS-Cov-2 infection and should not be used as the sole basis for treatment or other patient management decisions. A negative result may occur with  improper specimen collection/handling, submission of specimen other than nasopharyngeal swab, presence of viral mutation(s) within the areas targeted by this assay, and inadequate number of viral copies(<138 copies/mL). A negative result must be combined with clinical observations, patient history, and epidemiological information. The expected result is Negative.  Fact Sheet for Patients:  EntrepreneurPulse.com.au  Fact Sheet for Healthcare Providers:  IncredibleEmployment.be  This test is no t yet approved or cleared by the [place] FDA and  has been authorized for detection and/or diagnosis of SARS-CoV-2 by FDA under an Emergency Use Authorization (EUA). This EUA will remain  in effect (meaning this test can be used) for the duration of the COVID-19 declaration under Section 564(b)(1) of the Act, 21 U.S.C.section 360bbb-3(b)(1), unless the authorization is terminated  or revoked sooner.       Influenza A by PCR NEGATIVE NEGATIVE Final   Influenza B by PCR NEGATIVE NEGATIVE Final    Comment: (NOTE) The Xpert Xpress SARS-CoV-2/FLU/RSV plus assay is intended as an aid in the diagnosis of influenza from Nasopharyngeal swab specimens and should not be used as a sole basis for treatment. Nasal washings and aspirates are unacceptable for Xpert Xpress SARS-CoV-2/FLU/RSV testing.  Fact Sheet for Patients: EntrepreneurPulse.com.au  Fact Sheet for Healthcare  Providers: IncredibleEmployment.be  This test is not yet approved or cleared by the [place] FDA and has been authorized for detection and/or diagnosis of SARS-CoV-2 by FDA under an Emergency Use Authorization (EUA). This EUA will remain in effect (meaning this test can be used) for the duration of the COVID-19 declaration  under Section 564(b)(1) of the Act, 21 U.S.C. section 360bbb-3(b)(1), unless the authorization is terminated or revoked.  Performed at [facility], [place] [address] [place], [place] [address]   Urine culture     Status: None (Preliminary result)   Collection Time: 05/21/20  6:18 PM   Specimen: In/Out Cath Urine  Result Value Ref Range Status   Specimen Description   Final    IN/OUT CATH URINE Performed at [place] [address], [place], [place] [address]    Special Requests   Final    NONE Performed at [facility], [place] [address] [place], [place] [address]    Culture   Final    CULTURE REINCUBATED FOR BETTER GROWTH Performed at [facility], [place] [address] [place],  [address]    Report Status PENDING  Incomplete         Radiology Studies: DG Chest Port 1 View  Result Date: 05/21/2020 CLINICAL DATA:  Questionable sepsis multiple recent UTI and EXAM: PORTABLE CHEST 1 VIEW COMPARISON:  CT 04/27/2020 FINDINGS: Increased attenuation towards the lung bases likely attributable in part to body habitus. Mild diffuse interstitial opacity with vascular cephalization and congestion, cuffing, peripheral septal lines and fissural thickening. No visible pneumothorax or effusion. The aorta is calcified. The remaining cardiomediastinal contours are unremarkable. No acute osseous or soft tissue abnormality. Degenerative changes are present in the imaged spine and shoulders. Telemetry leads overlie the chest. 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 No focal consolidative process. Aortic Atherosclerosis (ICD10-I70.0). Electronically Signed   By: Lovena Le M.D.   On: 05/21/2020 16:23   CT Renal Stone Study  Result Date: 05/21/2020 CLINICAL DATA:  Right flank pain. EXAM: CT ABDOMEN AND PELVIS WITHOUT CONTRAST TECHNIQUE: Multidetector CT imaging of the abdomen and pelvis was performed following the standard protocol without IV contrast. COMPARISON:  CT May 07, 2020. FINDINGS: Lower chest: No acute abnormality.  Dependent atelectasis. Hepatobiliary: No focal liver abnormality is seen. Diffuse low attenuation of the liver, suggestive of hepatic steatosis. No gallstones, gallbladder wall thickening, or biliary dilatation. Pancreas: Similar partial fatty replacement. No pancreatic ductal dilatation or surrounding inflammatory changes. Spleen: Normal in size without focal abnormality. Adrenals/Urinary Tract: Adrenal glands are unremarkable. Kidneys are normal, without renal calculi, focal lesion, or hydronephrosis. Bilateral ureters are nondilated without evidence of radiodense calculi. Bladder is decompressed, limiting evaluation. Stomach/Bowel: Stomach is within normal limits. Appendix appears normal. No evidence of bowel wall thickening, distention, or inflammatory changes. Colonic diverticuli without evidence of acute diverticulitis. Vascular/Lymphatic: Aortic atherosclerosis. No enlarged abdominal or pelvic lymph nodes. Reproductive: Uterus and bilateral adnexa are unremarkable. Other: No free fluid or free gas. Musculoskeletal: Multilevel degenerative disc disease in the visualized spine. IMPRESSION: 1. No evidence of urinary tract calculus or hydronephrosis. No perinephric inflammatory changes. 2. Colonic diverticulosis without evidence of diverticulitis. 3. Hepatic steatosis. Electronically Signed   By: Margaretha Sheffield MD   On: 05/21/2020 16:58        Scheduled Meds: . acidophilus  1 capsule Oral Daily  . budesonide (PULMICORT)  nebulizer solution  0.25 mg Nebulization BID  . clopidogrel  75 mg Oral Q supper  . enoxaparin (LOVENOX) injection  60 mg Subcutaneous Q24H  . losartan  50 mg Oral Daily  . nitrofurantoin  50 mg Oral Once  . pantoprazole  40 mg Oral Daily  . rosuvastatin  20 mg Oral QPC supper   Continuous Infusions: . meropenem (MERREM) IV  1 g (05/23/20 0813)     LOS: 2 days    Time spent: 30 minutes    Barb Merino, MD [facility] Pager [phone]

## 2020-05-23 NOTE — Progress Notes (Signed)
Initial Nutrition Assessment  DOCUMENTATION CODES:   Morbid obesity  INTERVENTION:  - will order 30 ml Prosource Plus BID, each supplement provides 100 kcal and 15 grams protein.    NUTRITION DIAGNOSIS:   Increased nutrient needs related to acute illness (sepsis) as evidenced by estimated needs.  GOAL:   Patient will meet greater than or equal to 90% of their needs  MONITOR:   PO intake, Supplement acceptance, Labs, Weight trends  REASON FOR ASSESSMENT:   Malnutrition Screening Tool  ASSESSMENT:   69 year old female with medical history of asthma, HTN, hyperlipidemia, CAD, and OSA. She presented to the ED due to fever and recurrent UTIs. She was admitted 11/5-11/9 for UTI and was seen in the ED on 11/17 with acute cystitis.  She ate 100% of lunch and dinner yesterday (total of 1774 kcal, 45 grams protein) and 100% of breakfast this AM.   She reports that she has lost 8 lb in the past 2 months. Weight on 12/1 was 261 lb and weight on 11/5 was 264 lb. This indicates 3 lb weight loss (1.1% body weight) in 1 month. Weight on 10/12 was 276 lb. This indicates 15 lb weight loss (5.4% body weight) in 2 months; not significant for time frame.   Per notes: - severe sepsis 2/2 UTI - this is her fourth UTI in 2 months - MD note from this AM states patient from home and likely to go home tomorrow (12/4)   Labs reviewed; Ca: 8.2 mg/dl. Medications reviewed; 40 mg oral protonix/day.     NUTRITION - FOCUSED PHYSICAL EXAM:  completed; no muscle depletions, no fat depletions.  Diet Order:   Diet Order            Diet Heart Room service appropriate? Yes; Fluid consistency: Thin  Diet effective now                 EDUCATION NEEDS:   Not appropriate for education at this time  Skin:  Skin Assessment: Reviewed RN Assessment  Last BM:  PTA/unknown  Height:   Ht Readings from Last 1 Encounters:  05/21/20 5\' 5"  (1.651 m)    Weight:   Wt Readings from Last 1  Encounters:  05/21/20 118.4 kg    Estimated Nutritional Needs:  Kcal:  1900-2100 kcal Protein:  100-115 grams Fluid:  >/= 2 L/day      Jarome Matin, MS, RD, LDN, CNSC Inpatient Clinical Dietitian RD pager # available in [facility]  After hours/weekend pager # available in [facility]

## 2020-05-24 DIAGNOSIS — R6520 Severe sepsis without septic shock: Secondary | ICD-10-CM | POA: Diagnosis not present

## 2020-05-24 DIAGNOSIS — A419 Sepsis, unspecified organism: Secondary | ICD-10-CM | POA: Diagnosis not present

## 2020-05-24 LAB — CBC WITH DIFFERENTIAL/PLATELET
Abs Immature Granulocytes: 0.03 10*3/uL (ref 0.00–0.07)
Basophils Absolute: 0 10*3/uL (ref 0.0–0.1)
Basophils Relative: 1 %
Eosinophils Absolute: 0.8 10*3/uL — ABNORMAL HIGH (ref 0.0–0.5)
Eosinophils Relative: 12 %
HCT: 38 % (ref 36.0–46.0)
Hemoglobin: 12 g/dL (ref 12.0–15.0)
Immature Granulocytes: 1 %
Lymphocytes Relative: 17 %
Lymphs Abs: 1.1 10*3/uL (ref 0.7–4.0)
MCH: 29.2 pg (ref 26.0–34.0)
MCHC: 31.6 g/dL (ref 30.0–36.0)
MCV: 92.5 fL (ref 80.0–100.0)
Monocytes Absolute: 0.3 10*3/uL (ref 0.1–1.0)
Monocytes Relative: 5 %
Neutro Abs: 4.1 10*3/uL (ref 1.7–7.7)
Neutrophils Relative %: 64 %
Platelets: 172 10*3/uL (ref 150–400)
RBC: 4.11 MIL/uL (ref 3.87–5.11)
RDW: 14.5 % (ref 11.5–15.5)
WBC: 6.3 10*3/uL (ref 4.0–10.5)
nRBC: 0 % (ref 0.0–0.2)

## 2020-05-24 LAB — BASIC METABOLIC PANEL
Anion gap: 12 (ref 5–15)
BUN: 6 mg/dL — ABNORMAL LOW (ref 8–23)
CO2: 24 mmol/L (ref 22–32)
Calcium: 8.4 mg/dL — ABNORMAL LOW (ref 8.9–10.3)
Chloride: 103 mmol/L (ref 98–111)
Creatinine, Ser: 0.54 mg/dL (ref 0.44–1.00)
GFR, Estimated: >60 mL/min (ref >60)
Glucose, Bld: 107 mg/dL — ABNORMAL HIGH (ref 70–99)
Potassium: 3.6 mmol/L (ref 3.5–5.1)
Sodium: 139 mmol/L (ref 135–145)

## 2020-05-24 LAB — MAGNESIUM: Magnesium: 1.9 mg/dL (ref 1.7–2.4)

## 2020-05-24 MED ORDER — FOSFOMYCIN TROMETHAMINE 3 G PO PACK: 3.0000 g | PACK | Freq: Once | ORAL | 0 refills | Status: AC

## 2020-05-24 MED ORDER — FOSFOMYCIN TROMETHAMINE 3 G PO PACK
3.0000 g | PACK | Freq: Once | ORAL | Status: AC
  Administered 2020-05-24: 3 g via ORAL
  Filled 2020-05-24: qty 3

## 2020-05-24 MED ORDER — NITROFURANTOIN MACROCRYSTAL 50 MG PO CAPS
50.0000 mg | ORAL_CAPSULE | Freq: Every day | ORAL | 2 refills | Status: DC
Start: 2020-05-28 — End: 2020-05-29

## 2020-05-24 NOTE — Discharge Summary (Signed)
Physician Discharge Summary  Brittany Middleton PFX:[id number] DOB: [birth year] DOA: 05/21/2020  PCP: London Pepper, MD  Admit date: 05/21/2020 Discharge date: 05/24/2020  Admitted From: Home Disposition: Home  Recommendations for Outpatient Follow-up:  1. Follow up with PCP in 1-2 weeks 2. Follow-up with urologist as scheduled.  Home Health: Not applicable Equipment/Devices: Not needed  Discharge Condition: Stable CODE STATUS: Full code Diet recommendation: Low-salt diet, plenty of fluid  Discharge summary: 69 year old female with history of asthma, hypertension, hyperlipidemia, obstructive sleep apnea on CPAP, coronary artery disease on Plavix presented to the ER with fever and recurrent UTIs.  Reported multiple episodes of UTI including pyelonephritis and sepsis due to UTI.  Recent hospitalizations with multiple different types of micro data.  She was also recently seen by urology.  Urine culture from 11/17 ESBL E. Coli, 60,000: Urine culture from 11/30 pansensitive E. Coli, less than 50,000 Urine culture from 12/1, Enterococcus faecalis, 4000 Blood cultures negative. CT scan abdomen pelvis with no evidence of urogenital abnormalities, no post void residual urine. Treated as ESBL E. coli, partially treated UTI with severe sepsis present on admission that has improved. Since blood cultures are negative, will treat as localized UTI. Received meropenem for 3 days Fosfomycin 3 g 12/4 Fosfomycin 3 g 12/7 Challenged with nitrofurantoin that she tolerated. This is her fourth episode of UTI in last 2 months, will benefit with chronic suppressive therapy, will prescribe nitrofurantoin 50 mg once a day starting after completion of therapy. Will encourage follow-up with urology. Resume all her long-term medications.    Discharge Diagnoses:  Principal Problem:   Severe sepsis ([place]) Active Problems:   HTN (hypertension)   HLD (hyperlipidemia)   Hypokalemia   Urinary tract infection  due to extended-spectrum beta lactamase (ESBL) producing Escherichia coli   Asthma   Sleep apnea    Discharge Instructions  Discharge Instructions    Call MD for:  persistant nausea and vomiting   Complete by: As directed    Call MD for:  severe uncontrolled pain   Complete by: As directed    Call MD for:  temperature >100.4   Complete by: As directed    Diet - low sodium heart healthy   Complete by: As directed    Discharge instructions   Complete by: As directed    Drink plenty of fluids. Take over the counter probiotics   Increase activity slowly   Complete by: As directed      Allergies as of 05/24/2020      Reactions   Ciprofloxacin Other (See Comments)   Caused PAIN IN HANDS    Codeine Nausea And Vomiting   Macrobid [nitrofurantoin] Nausea Only      Medication List    STOP taking these medications   nitrofurantoin (macrocrystal-monohydrate) 100 MG capsule Commonly known as: Macrobid     TAKE these medications   acetaminophen 500 MG tablet Commonly known as: TYLENOL Take 1,000 mg by mouth every 6 (six) hours as needed for mild pain, moderate pain, fever or headache.   albuterol (2.5 MG/3ML) 0.083% nebulizer solution Commonly known as: PROVENTIL Take 2.5 mg by nebulization every 6 (six) hours as needed for wheezing or shortness of breath.   albuterol 108 (90 Base) MCG/ACT inhaler Commonly known as: VENTOLIN HFA Inhale 2 puffs into the lungs every 6 (six) hours as needed for wheezing or shortness of breath.   BIOTIN PO Take 1 tablet by mouth daily.   clopidogrel 75 MG tablet Commonly known as: PLAVIX Take 1  tablet (75 mg total) by mouth daily. What changed: when to take this   fluticasone 110 MCG/ACT inhaler Commonly known as: FLOVENT HFA Inhale 2 puffs into the lungs 2 (two) times daily.   fluticasone 50 MCG/ACT nasal spray Commonly known as: FLONASE Place 1 spray into both nostrils daily. What changed:   when to take this  reasons to take  this   fosfomycin 3 g Pack Commonly known as: MONUROL Take 3 g by mouth once for 1 dose. Start taking on: May 27, 2020 What changed: These instructions start on May 27, 2020. If you are unsure what to do until then, ask your doctor or other care provider.   hydrochlorothiazide 25 MG tablet Commonly known as: HYDRODIURIL Take 1 tablet (25 mg total) by mouth daily.   hydrocortisone 2.5 % cream Apply 1 application topically daily as needed (forehead).   IRON PO Take 1 tablet by mouth 2 (two) times a week.   losartan 50 MG tablet Commonly known as: COZAAR Take 1 tablet (50 mg total) by mouth daily.   Melatonin 5 MG Chew Chew 5 mg by mouth at bedtime.   multivitamin with minerals tablet Take 1 tablet by mouth daily.   nitrofurantoin 50 MG capsule Commonly known as: MACRODANTIN Take 1 capsule (50 mg total) by mouth at bedtime. Start taking on: May 28, 2020   pantoprazole 40 MG tablet Commonly known as: PROTONIX Take 1 tablet (40 mg total) by mouth daily.   PRESCRIPTION MEDICATION Inhale into the lungs at bedtime. CPAP   QUNOL ULTRA COQ10 PO Take 1 capsule by mouth daily.   rosuvastatin 20 MG tablet Commonly known as: CRESTOR Take 1 tablet (20 mg total) by mouth daily. What changed: when to take this   TURMERIC PO Take 1 capsule by mouth daily.   Vitamin D-3 25 MCG (1000 UT) Caps Take 1,000 Units by mouth daily.       Follow-up Information    London Pepper, MD Follow up in 2 week(s).   Specialty: Family Medicine Contact information: [address]  [place] [phone]              Allergies  Allergen Reactions  . Ciprofloxacin Other (See Comments)    Caused PAIN IN HANDS   . Codeine Nausea And Vomiting  . Macrobid [Nitrofurantoin] Nausea Only    Consultations:  None   Procedures/Studies: CT ANGIO CHEST PE W OR WO CONTRAST  Result Date: 04/27/2020 CLINICAL DATA:  Elevated D-dimer.  Shortness of  breath. EXAM: CT ANGIOGRAPHY CHEST WITH CONTRAST TECHNIQUE: Multidetector CT imaging of the chest was performed using the standard protocol during bolus administration of intravenous contrast. Multiplanar CT image reconstructions and MIPs were obtained to evaluate the vascular anatomy. CONTRAST:  113mL OMNIPAQUE IOHEXOL 350 MG/ML SOLN COMPARISON:  06/25/2019.  03/30/2020 FINDINGS: Cardiovascular: There is respiratory motion artifact that limits evaluation for acute pulmonary emboli. There is no pulmonary embolus or evidence of right heart strain. The size of the main pulmonary artery is normal. Heart size is normal, with no pericardial effusion. The course and caliber of the aorta are normal. There is no atherosclerotic calcification. Opacification decreased due to pulmonary arterial phase contrast bolus timing. Again noted is an aberrant right subclavian artery, a normal variant. Mediastinum/Nodes: -- No mediastinal lymphadenopathy. -- No hilar lymphadenopathy. -- No axillary lymphadenopathy. -- No supraclavicular lymphadenopathy. -- Normal thyroid gland where visualized. -  Unremarkable esophagus. Lungs/Pleura: There are small bilateral pleural effusions with adjacent atelectasis.  Again noted is some mild interlobular septal thickening. There is no pneumothorax. Upper Abdomen: Contrast bolus timing is not optimized for evaluation of the abdominal organs. The visualized portions of the organs of the upper abdomen are normal. Musculoskeletal: No chest wall abnormality. No bony spinal canal stenosis. Review of the MIP images confirms the above findings. IMPRESSION: 1. No acute pulmonary embolism. 2. Small bilateral pleural effusions with adjacent atelectasis. 3. Stable mild interlobular septal thickening, which may be seen in patients with mild interstitial edema. 4. Aberrant right subclavian artery, a normal variant. Aortic Atherosclerosis (ICD10-I70.0). Electronically Signed   By: Constance Holster M.D.   On:  04/27/2020 20:40   CT Abdomen Pelvis W Contrast  Result Date: 05/08/2020 CLINICAL DATA:  UTI, with pressure, urgency and pain EXAM: CT ABDOMEN AND PELVIS WITH CONTRAST TECHNIQUE: Multidetector CT imaging of the abdomen and pelvis was performed using the standard protocol following bolus administration of intravenous contrast. CONTRAST:  170mL OMNIPAQUE IOHEXOL 300 MG/ML  SOLN COMPARISON:  CT 03/30/2020 FINDINGS: Lower chest: Atelectatic changes in the otherwise clear lung bases. Normal heart size. No pericardial effusion. Hepatobiliary: No worrisome focal liver lesions. Smooth liver surface contour. Normal hepatic attenuation. Normal gallbladder and biliary tree. Pancreas: Partial fatty replacement of the pancreas. No pancreatic ductal dilatation or surrounding inflammatory changes. Spleen: Normal in size. No concerning splenic lesions. Adrenals/Urinary Tract: Normal adrenal glands. Kidneys are normally located with symmetric enhancement and excretion. No suspicious renal lesion, urolithiasis or hydronephrosis. Bladder is decompressed though demonstrates fairly significant perivesicular hazy stranding, mucosal hyperemia and edematous mural thickening. No visible bladder calculi or debris. Stomach/Bowel: Small distal esophagus, stomach and duodenum are unremarkable. No small bowel thickening or dilatation. A normal appendix is visualized. No colonic dilatation or wall thickening. High attenuation material, possibly retained barium, seen within numerous colonic diverticula. No acute diverticular inflammation to suggest an active diverticulitis at this time. Slight redundancy of the sigmoid. No bowel obstruction. Vascular/Lymphatic: Atherosclerotic calcifications within the abdominal aorta and branch vessels. No aneurysm or ectasia. No enlarged abdominopelvic lymph nodes. Reproductive: Anteverted uterus.  Normal quiescent ovarian tissue. Other: No abdominopelvic free fluid or free gas. No bowel containing hernias.  Musculoskeletal: Multilevel discogenic and facet degenerative changes are present throughout the spine similar to slightly progressed from comparison exam. Dextrocurvature of the lower lumbar levels, apex L4. Mild rightward pelvic tilt. Some sclerotic changes at the pubic symphysis are similar prior and may reflect sequela of prior osteitis pubis. Additional degenerative changes in the hips and SI joints. No acute fracture, traumatic osseous injury, or suspicious lytic or blastic lesions are seen. IMPRESSION: 1. Bladder is decompressed though demonstrates significant perivesicular hazy stranding, mucosal hyperemia and edematous mural thickening. Findings are suggestive of cystitis. Correlate with urinalysis. No abnormal perinephric or periureteral stranding or other features to suggest an ascending tract infection at this time. 2. Colonic diverticulosis without evidence of acute diverticulitis. 3. Aortic Atherosclerosis (ICD10-I70.0). Electronically Signed   By: Lovena Le M.D.   On: 05/08/2020 01:29   DG Chest Port 1 View  Result Date: 05/21/2020 CLINICAL DATA:  Questionable sepsis multiple recent UTI and EXAM: PORTABLE CHEST 1 VIEW COMPARISON:  CT 04/27/2020 FINDINGS: Increased attenuation towards the lung bases likely attributable in part to body habitus. Mild diffuse interstitial opacity with vascular cephalization and congestion, cuffing, peripheral septal lines and fissural thickening. No visible pneumothorax or effusion. The aorta is calcified. The remaining cardiomediastinal contours are unremarkable. No acute osseous or soft tissue abnormality. Degenerative changes are present in the  imaged spine and shoulders. Telemetry leads overlie the chest. IMPRESSION: Findings most suggestive of interstitial pulmonary edema. No focal consolidative process. Aortic Atherosclerosis (ICD10-I70.0). Electronically Signed   By: Lovena Le M.D.   On: 05/21/2020 16:23   DG Chest Port 1 View  Result Date:  04/25/2020 CLINICAL DATA:  Questionable sepsis. EXAM: PORTABLE CHEST 1 VIEW COMPARISON:  03/30/2020 FINDINGS: Cardiomediastinal contours and hilar structures are normal. Lungs are clear. No gross sign of effusion.  Leads project over the chest. On limited assessment no acute skeletal process. IMPRESSION: No acute cardiopulmonary process. Electronically Signed   By: Zetta Bills M.D.   On: 04/25/2020 11:46   ECHOCARDIOGRAM COMPLETE  Result Date: 04/27/2020    ECHOCARDIOGRAM REPORT   Patient Name:   [facility] [place] Date of Exam: 04/27/2020 Medical Rec #:  [id number]           Height:       65.0 in Accession #:    [id number]          Weight:       265.0 lb Date of Birth:  July 28, 1950           BSA:          2.229 m Patient Age:    23 years            BP:           138/49 mmHg Patient Gender: F                   HR:           66 bpm. Exam Location:  Inpatient Procedure: 2D Echo Indications:    Dyspnea 786.09 / R06.00  History:        Patient has prior history of Echocardiogram examinations, most                 recent 02/28/2018. Risk Factors:Hypertension and Dyslipidemia.                 GERD. Sepsis secondary to UTI.  Sonographer:    Deretha Emory Referring Phys: [id number] TAYE T GONFA IMPRESSIONS  1. Left ventricular ejection fraction, by estimation, is 55 to 60%. The left ventricle has normal function. The left ventricle has no regional wall motion abnormalities. Left ventricular diastolic parameters were normal.  2. Right ventricular systolic function is normal. The right ventricular size is normal.  3. Left atrial size was mildly dilated.  4. The mitral valve is normal in structure. No evidence of mitral valve regurgitation. No evidence of mitral stenosis.  5. The aortic valve was not well visualized. Aortic valve regurgitation is not visualized. No aortic stenosis is present.  6. The inferior vena cava is dilated in size with >50% respiratory variability, suggesting right atrial pressure of 8 mmHg. FINDINGS   Left Ventricle: Left ventricular ejection fraction, by estimation, is 55 to 60%. The left ventricle has normal function. The left ventricle has no regional wall motion abnormalities. The left ventricular internal cavity size was normal in size. There is  no left ventricular hypertrophy. Left ventricular diastolic parameters were normal. Right Ventricle: The right ventricular size is normal. No increase in right ventricular wall thickness. Right ventricular systolic function is normal. Left Atrium: Left atrial size was mildly dilated. Right Atrium: Right atrial size was normal in size. Pericardium: There is no evidence of pericardial effusion. Mitral Valve: The mitral valve is normal in structure. No evidence of mitral valve regurgitation. No evidence of mitral  valve stenosis. Tricuspid Valve: The tricuspid valve is normal in structure. Tricuspid valve regurgitation is not demonstrated. No evidence of tricuspid stenosis. Aortic Valve: The aortic valve was not well visualized. Aortic valve regurgitation is not visualized. No aortic stenosis is present. Aortic valve mean gradient measures 7.4 mmHg. Aortic valve peak gradient measures 16.0 mmHg. Aortic valve area, by VTI measures 1.94 cm. Pulmonic Valve: The pulmonic valve was not well visualized. Pulmonic valve regurgitation is not visualized. No evidence of pulmonic stenosis. Aorta: The aortic root is normal in size and structure. Pulmonary Artery: Indeterminant PASP, inadequate TR jet. Venous: The inferior vena cava is dilated in size with greater than 50% respiratory variability, suggesting right atrial pressure of 8 mmHg. IAS/Shunts: No atrial level shunt detected by color flow Doppler.  LEFT VENTRICLE PLAX 2D LVIDd:         5.17 cm      Diastology LVIDs:         3.89 cm      LV e' medial:    9.57 cm/s LV PW:         0.84 cm      LV E/e' medial:  12.4 LV IVS:        1.05 cm      LV e' lateral:   8.70 cm/s LVOT diam:     2.00 cm      LV E/e' lateral: 13.7 LV SV:          79 LV SV Index:   35 LVOT Area:     3.14 cm  LV Volumes (MOD) LV vol d, MOD A2C: 100.0 ml LV vol d, MOD A4C: 106.0 ml LV vol s, MOD A2C: 49.8 ml LV vol s, MOD A4C: 49.7 ml LV SV MOD A2C:     50.2 ml LV SV MOD A4C:     106.0 ml LV SV MOD BP:      57.1 ml RIGHT VENTRICLE RV S prime:     18.40 cm/s TAPSE (M-mode): 2.6 cm LEFT ATRIUM             Index       RIGHT ATRIUM           Index LA diam:        4.40 cm 1.97 cm/m  RA Area:     14.00 cm LA Vol (A2C):   65.8 ml 29.51 ml/m RA Volume:   29.60 ml  13.28 ml/m LA Vol (A4C):   65.2 ml 29.24 ml/m LA Biplane Vol: 65.6 ml 29.42 ml/m  AORTIC VALVE AV Area (Vmax):    2.06 cm AV Area (Vmean):   2.01 cm AV Area (VTI):     1.94 cm AV Vmax:           199.81 cm/s AV Vmean:          126.704 cm/s AV VTI:            0.406 m AV Peak Grad:      16.0 mmHg AV Mean Grad:      7.4 mmHg LVOT Vmax:         130.95 cm/s LVOT Vmean:        80.919 cm/s LVOT VTI:          0.251 m LVOT/AV VTI ratio: 0.62  AORTA Ao Root diam: 2.70 cm MITRAL VALVE MV Area (PHT): 3.99 cm     SHUNTS MV Decel Time: 190 msec     Systemic VTI:  0.25 m MV E velocity:  119.00 cm/s  Systemic Diam: 2.00 cm MV A velocity: 86.80 cm/s MV E/A ratio:  1.37 Carlyle Dolly MD Electronically signed by Carlyle Dolly MD Signature Date/Time: 04/27/2020/12:17:42 PM    Final    CT Renal Stone Study  Result Date: 05/21/2020 CLINICAL DATA:  Right flank pain. EXAM: CT ABDOMEN AND PELVIS WITHOUT CONTRAST TECHNIQUE: Multidetector CT imaging of the abdomen and pelvis was performed following the standard protocol without IV contrast. COMPARISON:  CT May 07, 2020. FINDINGS: Lower chest: No acute abnormality.  Dependent atelectasis. Hepatobiliary: No focal liver abnormality is seen. Diffuse low attenuation of the liver, suggestive of hepatic steatosis. No gallstones, gallbladder wall thickening, or biliary dilatation. Pancreas: Similar partial fatty replacement. No pancreatic ductal dilatation or surrounding inflammatory  changes. Spleen: Normal in size without focal abnormality. Adrenals/Urinary Tract: Adrenal glands are unremarkable. Kidneys are normal, without renal calculi, focal lesion, or hydronephrosis. Bilateral ureters are nondilated without evidence of radiodense calculi. Bladder is decompressed, limiting evaluation. Stomach/Bowel: Stomach is within normal limits. Appendix appears normal. No evidence of bowel wall thickening, distention, or inflammatory changes. Colonic diverticuli without evidence of acute diverticulitis. Vascular/Lymphatic: Aortic atherosclerosis. No enlarged abdominal or pelvic lymph nodes. Reproductive: Uterus and bilateral adnexa are unremarkable. Other: No free fluid or free gas. Musculoskeletal: Multilevel degenerative disc disease in the visualized spine. IMPRESSION: 1. No evidence of urinary tract calculus or hydronephrosis. No perinephric inflammatory changes. 2. Colonic diverticulosis without evidence of diverticulitis. 3. Hepatic steatosis. Electronically Signed   By: Margaretha Sheffield MD   On: 05/21/2020 16:58   VAS [place] LOWER EXTREMITY VENOUS (DVT)  Result Date: 04/27/2020  Lower Venous DVT Study Indications: Elevated d-dimer=1.13.  Limitations: Body habitus, poor ultrasound/tissue interface and Unable to tolerate some compression secondary to pain. Comparison Study: No prior study Performing Technologist: Maudry Mayhew MHA, RDMS, RVT, RDCS  Examination Guidelines: A complete evaluation includes B-mode imaging, spectral Doppler, color Doppler, and power Doppler as needed of all accessible portions of each vessel. Bilateral testing is considered an integral part of a complete examination. Limited examinations for reoccurring indications may be performed as noted. The reflux portion of the exam is performed with the patient in reverse Trendelenburg.  +---------+---------------+---------+-----------+----------+--------------+ RIGHT     CompressibilityPhasicitySpontaneityPropertiesThrombus Aging +---------+---------------+---------+-----------+----------+--------------+ CFV      Full           Yes      Yes                                 +---------+---------------+---------+-----------+----------+--------------+ SFJ      Full                                                        +---------+---------------+---------+-----------+----------+--------------+ FV Prox  Full                                                        +---------+---------------+---------+-----------+----------+--------------+ FV Mid   Full                                                        +---------+---------------+---------+-----------+----------+--------------+  FV Distal               Yes      Yes        patent                   +---------+---------------+---------+-----------+----------+--------------+ PFV      Full                                                        +---------+---------------+---------+-----------+----------+--------------+ POP      Full           Yes      Yes                                 +---------+---------------+---------+-----------+----------+--------------+ PTV      Full                                                        +---------+---------------+---------+-----------+----------+--------------+ PERO     Full                                                        +---------+---------------+---------+-----------+----------+--------------+   +---------+---------------+---------+-----------+----------+--------------+ LEFT     CompressibilityPhasicitySpontaneityPropertiesThrombus Aging +---------+---------------+---------+-----------+----------+--------------+ CFV      Full           Yes      Yes                                 +---------+---------------+---------+-----------+----------+--------------+ SFJ      Full                                                         +---------+---------------+---------+-----------+----------+--------------+ FV Prox  Full                                                        +---------+---------------+---------+-----------+----------+--------------+ FV Mid   Full                                                        +---------+---------------+---------+-----------+----------+--------------+ FV Distal                                   patent                   +---------+---------------+---------+-----------+----------+--------------+  PFV      Full                                                        +---------+---------------+---------+-----------+----------+--------------+ POP      Full           Yes      Yes                                 +---------+---------------+---------+-----------+----------+--------------+ PTV      Full                                                        +---------+---------------+---------+-----------+----------+--------------+ PERO     Full                                                        +---------+---------------+---------+-----------+----------+--------------+     Summary: RIGHT: - There is no evidence of deep vein thrombosis in the lower extremity. However, portions of this examination were limited- see technologist comments above.  - No cystic structure found in the popliteal fossa.  LEFT: - There is no evidence of deep vein thrombosis in the lower extremity. However, portions of this examination were limited- see technologist comments above.  - No cystic structure found in the popliteal fossa.  *See table(s) above for measurements and observations. Electronically signed by Servando Snare MD on 04/27/2020 at 8:36:37 PM.    Final     (Echo, Carotid, EGD, Colonoscopy, ERCP)    Subjective: Patient seen and examined.  She had no problem with taking nitrofurantoin yesterday.  Does not have any nausea vomiting.  Remains afebrile.   Urinating well.  No retention or post void residual present.   Discharge Exam: Vitals:   05/23/20 2202 05/24/20 0522  BP: 136/61 (!) 107/95  Pulse: 70 63  Resp: 17 18  Temp: 98.9 F (37.2 C) 98.5 F (36.9 C)  SpO2: 93% 94%   Vitals:   05/23/20 1323 05/23/20 2056 05/23/20 2202 05/24/20 0522  BP: (!) 121/53  136/61 (!) 107/95  Pulse: 64  70 63  Resp: 17  17 18   Temp: 98.3 F (36.8 C)  98.9 F (37.2 C) 98.5 F (36.9 C)  TempSrc: Oral  Oral Oral  SpO2: 94% 99% 93% 94%  Weight:      Height:        General: Pt is alert, awake, not in acute distress Cardiovascular: RRR, S1/S2 +, no rubs, no gallops Respiratory: CTA bilaterally, no wheezing, no rhonchi Abdominal: Soft, NT, ND, bowel sounds + Extremities: no edema, no cyanosis    The results of significant diagnostics from this hospitalization (including imaging, microbiology, ancillary and laboratory) are listed below for reference.     Microbiology: Recent Results (from the past 240 hour(s))  Urine Culture     Status: Abnormal   Collection Time: 05/20/20 12:00 AM   Specimen: Urine, Clean Catch   Urine  Release to pati  Result Value Ref Range Status   Urine Culture, Routine Final report (A)  Final   Organism ID, Bacteria Escherichia coli (A)  Final    Comment: 50,000-100,000 colony forming units per mL Cefazolin with an MIC <=16 predicts susceptibility to the oral agents cefaclor, cefdinir, cefpodoxime, cefprozil, cefuroxime, cephalexin, and loracarbef when used for therapy of uncomplicated urinary tract infections due to E. coli, Klebsiella pneumoniae, and Proteus mirabilis.    ORGANISM ID, BACTERIA Comment  Final    Comment: Mixed urogenital flora 10,000-25,000 colony forming units per mL    Antimicrobial Susceptibility Comment  Final    Comment:       ** S = Susceptible; I = Intermediate; R = Resistant **                    P = Positive; N = Negative             MICS are expressed in micrograms per mL     Antibiotic                 RSLT#1    RSLT#2    RSLT#3    RSLT#4 Amoxicillin/Clavulanic Acid    S Ampicillin                     R Cefazolin                      S Cefepime                       S Ceftriaxone                    S Cefuroxime                     S Ciprofloxacin                  S Ertapenem                      S Gentamicin                     S Imipenem                       S Levofloxacin                   S Meropenem                      S Nitrofurantoin                 S Piperacillin/Tazobactam        S Tetracycline                   S Tobramycin                     S Trimethoprim/Sulfa             S   Microscopic Examination     Status: Abnormal   Collection Time: 05/20/20  2:22 PM   Urine  Result Value Ref Range Status   WBC, UA 0-5 0 - 5 /hpf Final   RBC 3-10 (A) 0 - 2 /hpf Final   Epithelial Cells (non renal) 0-10 0 - 10 /hpf Final   Renal Epithel,  UA None seen None seen /hpf Final   Bacteria, UA Few (A) None seen/Few Final  Blood Culture (routine x 2)     Status: None (Preliminary result)   Collection Time: 05/21/20  3:31 PM   Specimen: BLOOD  Result Value Ref Range Status   Specimen Description   Final    BLOOD LEFT ANTECUBITAL Performed at [facility] [address] [place], [place] [address]    Special Requests   Final    BOTTLES DRAWN AEROBIC AND ANAEROBIC Blood Culture adequate volume Performed at [facility] [address] [place], [place] [address]    Culture   Final    NO GROWTH 2 DAYS Performed at [facility] [address], [place], [place] [address]    Report Status PENDING  Incomplete  Blood Culture (routine x 2)     Status: None (Preliminary result)   Collection Time: 05/21/20  3:45 PM   Specimen: BLOOD  Result Value Ref Range Status   Specimen Description   Final    BLOOD RIGHT ANTECUBITAL Performed at [facility] [address], [place], [place] [address]     Special Requests   Final    BOTTLES DRAWN AEROBIC AND ANAEROBIC Blood Culture results may not be optimal due to an excessive volume of blood received in culture bottles Performed at [facility] [address], [place], [place] [address]    Culture   Final    NO GROWTH 2 DAYS Performed at [facility] [address] [place], [place] [address]    Report Status PENDING  Incomplete  Resp Panel by RT-PCR (Flu A&B, Covid) Nasopharyngeal Swab     Status: None   Collection Time: 05/21/20  5:30 PM   Specimen: Nasopharyngeal Swab; Nasopharyngeal(NP) swabs in vial transport medium  Result Value Ref Range Status   SARS Coronavirus 2 by RT PCR NEGATIVE NEGATIVE Final    Comment: (NOTE) SARS-CoV-2 target nucleic acids are NOT DETECTED.  The SARS-CoV-2 RNA is generally detectable in upper respiratory specimens during the acute phase of infection. The lowest concentration of SARS-CoV-2 viral copies this assay can detect is 138 copies/mL. A negative result does not preclude SARS-Cov-2 infection and should not be used as the sole basis for treatment or other patient management decisions. A negative result may occur with  improper specimen collection/handling, submission of specimen other than nasopharyngeal swab, presence of viral mutation(s) within the areas targeted by this assay, and inadequate number of viral copies(<138 copies/mL). A negative result must be combined with clinical observations, patient history, and epidemiological information. The expected result is Negative.  Fact Sheet for Patients:  EntrepreneurPulse.com.au  Fact Sheet for Healthcare Providers:  IncredibleEmployment.be  This test is no t yet approved or cleared by the [place] FDA and  has been authorized for detection and/or diagnosis of SARS-CoV-2 by FDA under an Emergency Use Authorization (EUA). This EUA will remain  in effect (meaning this test  can be used) for the duration of the COVID-19 declaration under Section 564(b)(1) of the Act, 21 U.S.C.section 360bbb-3(b)(1), unless the authorization is terminated  or revoked sooner.       Influenza A by PCR NEGATIVE NEGATIVE Final   Influenza B by PCR NEGATIVE NEGATIVE Final    Comment: (NOTE) The Xpert Xpress SARS-CoV-2/FLU/RSV plus assay is intended as an aid in the diagnosis of influenza from Nasopharyngeal swab specimens and should not be used as a sole basis  for treatment. Nasal washings and aspirates are unacceptable for Xpert Xpress SARS-CoV-2/FLU/RSV testing.  Fact Sheet for Patients: EntrepreneurPulse.com.au  Fact Sheet for Healthcare Providers: IncredibleEmployment.be  This test is not yet approved or cleared by the [place] FDA and has been authorized for detection and/or diagnosis of SARS-CoV-2 by FDA under an Emergency Use Authorization (EUA). This EUA will remain in effect (meaning this test can be used) for the duration of the COVID-19 declaration under Section 564(b)(1) of the Act, 21 U.S.C. section 360bbb-3(b)(1), unless the authorization is terminated or revoked.  Performed at [facility], [place] [address] [place], [place] [address]   Urine culture     Status: Abnormal (Preliminary result)   Collection Time: 05/21/20  6:18 PM   Specimen: In/Out Cath Urine  Result Value Ref Range Status   Specimen Description   Final    IN/OUT CATH URINE Performed at [facility] [address] [place], [place] [address]    Special Requests   Final    NONE Performed at [facility], [place] [address] [place], [place] [address]    Culture (A)  Final    4,000 COLONIES/mL ENTEROCOCCUS FAECALIS SUSCEPTIBILITIES TO FOLLOW Performed at [facility], [place] [address] [place], [place] [address]    Report Status PENDING  Incomplete     Labs: BNP (last 3  results) Recent Labs    03/30/20 1708 04/26/20 0532 05/21/20 1545  BNP 95.5 124.2* 401.0*   Basic Metabolic Panel: Recent Labs  Lab 05/21/20 1413 05/21/20 1545 05/22/20 0310 05/24/20 0423  NA 135  --  137 139  K 3.1*  --  3.6 3.6  CL 99  --  103 103  CO2 22  --  25 24  GLUCOSE 154*  --  108* 107*  BUN 15  --  15 6*  CREATININE 0.91  --  0.68 0.54  CALCIUM 8.4*  --  8.2* 8.4*  MG  --  1.7 2.0 1.9   Liver Function Tests: Recent Labs  Lab 05/21/20 1413  AST 53*  ALT 55*  ALKPHOS 40  BILITOT 1.5*  PROT 6.6  ALBUMIN 3.6   No results for input(s): LIPASE, AMYLASE in the last 168 hours. No results for input(s): AMMONIA in the last 168 hours. CBC: Recent Labs  Lab 05/21/20 1413 05/22/20 0310 05/24/20 0423  WBC 16.2* 9.8 6.3  NEUTROABS 15.1*  --  4.1  HGB 12.8 11.1* 12.0  HCT 40.2 35.4* 38.0  MCV 93.5 94.4 92.5  PLT 183 140* 172   Cardiac Enzymes: No results for input(s): CKTOTAL, CKMB, CKMBINDEX, TROPONINI in the last 168 hours. BNP: Invalid input(s): POCBNP CBG: No results for input(s): GLUCAP in the last 168 hours. D-Dimer No results for input(s): DDIMER in the last 72 hours. Hgb A1c No results for input(s): HGBA1C in the last 72 hours. Lipid Profile No results for input(s): CHOL, HDL, LDLCALC, TRIG, CHOLHDL, LDLDIRECT in the last 72 hours. Thyroid function studies No results for input(s): TSH, T4TOTAL, T3FREE, THYROIDAB in the last 72 hours.  Invalid input(s): FREET3 Anemia work up No results for input(s): VITAMINB12, FOLATE, FERRITIN, TIBC, IRON, RETICCTPCT in the last 72 hours. Urinalysis    Component Value Date/Time   COLORURINE YELLOW 05/21/2020 1818   APPEARANCEUR CLEAR 05/21/2020 1818   APPEARANCEUR Clear 05/20/2020 1422   LABSPEC 1.021 05/21/2020 1818   PHURINE 5.0 05/21/2020 1818   GLUCOSEU NEGATIVE 05/21/2020 1818   HGBUR MODERATE (A) 05/21/2020 1818  BILIRUBINUR NEGATIVE 05/21/2020 1818   BILIRUBINUR Negative 05/20/2020 1422    KETONESUR 5 (A) 05/21/2020 1818   PROTEINUR NEGATIVE 05/21/2020 1818   UROBILINOGEN 1.0 10/31/2014 1646   NITRITE NEGATIVE 05/21/2020 1818   LEUKOCYTESUR NEGATIVE 05/21/2020 1818   Sepsis Labs Invalid input(s): PROCALCITONIN,  WBC,  LACTICIDVEN Microbiology Recent Results (from the past 240 hour(s))  Urine Culture     Status: Abnormal   Collection Time: 05/20/20 12:00 AM   Specimen: Urine, Clean Catch   Urine  Release to pati  Result Value Ref Range Status   Urine Culture, Routine Final report (A)  Final   Organism ID, Bacteria Escherichia coli (A)  Final    Comment: 50,000-100,000 colony forming units per mL Cefazolin with an MIC <=16 predicts susceptibility to the oral agents cefaclor, cefdinir, cefpodoxime, cefprozil, cefuroxime, cephalexin, and loracarbef when used for therapy of uncomplicated urinary tract infections due to E. coli, Klebsiella pneumoniae, and Proteus mirabilis.    ORGANISM ID, BACTERIA Comment  Final    Comment: Mixed urogenital flora 10,000-25,000 colony forming units per mL    Antimicrobial Susceptibility Comment  Final    Comment:       ** S = Susceptible; I = Intermediate; R = Resistant **                    P = Positive; N = Negative             MICS are expressed in micrograms per mL    Antibiotic                 RSLT#1    RSLT#2    RSLT#3    RSLT#4 Amoxicillin/Clavulanic Acid    S Ampicillin                     R Cefazolin                      S Cefepime                       S Ceftriaxone                    S Cefuroxime                     S Ciprofloxacin                  S Ertapenem                      S Gentamicin                     S Imipenem                       S Levofloxacin                   S Meropenem                      S Nitrofurantoin                 S Piperacillin/Tazobactam        S Tetracycline                   S Tobramycin  S Trimethoprim/Sulfa             S   Microscopic Examination     Status:  Abnormal   Collection Time: 05/20/20  2:22 PM   Urine  Result Value Ref Range Status   WBC, UA 0-5 0 - 5 /hpf Final   RBC 3-10 (A) 0 - 2 /hpf Final   Epithelial Cells (non renal) 0-10 0 - 10 /hpf Final   Renal Epithel, UA None seen None seen /hpf Final   Bacteria, UA Few (A) None seen/Few Final  Blood Culture (routine x 2)     Status: None (Preliminary result)   Collection Time: 05/21/20  3:31 PM   Specimen: BLOOD  Result Value Ref Range Status   Specimen Description   Final    BLOOD LEFT ANTECUBITAL Performed at [facility], [place] [address], [place], [place] [address]    Special Requests   Final    BOTTLES DRAWN AEROBIC AND ANAEROBIC Blood Culture adequate volume Performed at [facility] [address], [place], [place] [address]    Culture   Final    NO GROWTH 2 DAYS Performed at [facility] [address] [place], [place] [address]    Report Status PENDING  Incomplete  Blood Culture (routine x 2)     Status: None (Preliminary result)   Collection Time: 05/21/20  3:45 PM   Specimen: BLOOD  Result Value Ref Range Status   Specimen Description   Final    BLOOD RIGHT ANTECUBITAL Performed at [facility] [address] [place], [place] [address]    Special Requests   Final    BOTTLES DRAWN AEROBIC AND ANAEROBIC Blood Culture results may not be optimal due to an excessive volume of blood received in culture bottles Performed at [facility] [address], [place], [place] [address]    Culture   Final    NO GROWTH 2 DAYS Performed at [facility] [address], [place], [place] [address]    Report Status PENDING  Incomplete  Resp Panel by RT-PCR (Flu A&B, Covid) Nasopharyngeal Swab     Status: None   Collection Time: 05/21/20  5:30 PM   Specimen: Nasopharyngeal Swab; Nasopharyngeal(NP) swabs in vial transport medium  Result Value Ref Range Status   SARS Coronavirus 2 by RT  PCR NEGATIVE NEGATIVE Final    Comment: (NOTE) SARS-CoV-2 target nucleic acids are NOT DETECTED.  The SARS-CoV-2 RNA is generally detectable in upper respiratory specimens during the acute phase of infection. The lowest concentration of SARS-CoV-2 viral copies this assay can detect is 138 copies/mL. A negative result does not preclude SARS-Cov-2 infection and should not be used as the sole basis for treatment or other patient management decisions. A negative result may occur with  improper specimen collection/handling, submission of specimen other than nasopharyngeal swab, presence of viral mutation(s) within the areas targeted by this assay, and inadequate number of viral copies(<138 copies/mL). A negative result must be combined with clinical observations, patient history, and epidemiological information. The expected result is Negative.  Fact Sheet for Patients:  EntrepreneurPulse.com.au  Fact Sheet for Healthcare Providers:  IncredibleEmployment.be  This test is no t yet approved or cleared by the [place] FDA and  has been authorized for detection and/or diagnosis of SARS-CoV-2 by FDA under an Emergency Use Authorization (EUA). This EUA will remain  in effect (meaning this test can be used) for the duration  of the COVID-19 declaration under Section 564(b)(1) of the Act, 21 U.S.C.section 360bbb-3(b)(1), unless the authorization is terminated  or revoked sooner.       Influenza A by PCR NEGATIVE NEGATIVE Final   Influenza B by PCR NEGATIVE NEGATIVE Final    Comment: (NOTE) The Xpert Xpress SARS-CoV-2/FLU/RSV plus assay is intended as an aid in the diagnosis of influenza from Nasopharyngeal swab specimens and should not be used as a sole basis for treatment. Nasal washings and aspirates are unacceptable for Xpert Xpress SARS-CoV-2/FLU/RSV testing.  Fact Sheet for Patients: EntrepreneurPulse.com.au  Fact Sheet for  Healthcare Providers: IncredibleEmployment.be  This test is not yet approved or cleared by the [place] FDA and has been authorized for detection and/or diagnosis of SARS-CoV-2 by FDA under an Emergency Use Authorization (EUA). This EUA will remain in effect (meaning this test can be used) for the duration of the COVID-19 declaration under Section 564(b)(1) of the Act, 21 U.S.C. section 360bbb-3(b)(1), unless the authorization is terminated or revoked.  Performed at [facility], [place] [address], [place], [place] [address]   Urine culture     Status: Abnormal (Preliminary result)   Collection Time: 05/21/20  6:18 PM   Specimen: In/Out Cath Urine  Result Value Ref Range Status   Specimen Description   Final    IN/OUT CATH URINE Performed at [facility] [address], [place], [place] [address]    Special Requests   Final    NONE Performed at [facility], [place] [address], [place], [place] [address]    Culture (A)  Final    4,000 COLONIES/mL ENTEROCOCCUS FAECALIS SUSCEPTIBILITIES TO FOLLOW Performed at [facility], [place] [address] [place], [place] [address]    Report Status PENDING  Incomplete     Time coordinating discharge: 32 minutes  SIGNED:   Barb Merino, MD  [facility] 05/24/2020, 9:02 AM

## 2020-05-25 LAB — URINE CULTURE: Culture: 4000 — AB

## 2020-05-26 ENCOUNTER — Inpatient Hospital Stay: Payer: Medicare Other | Admitting: Pulmonary Disease

## 2020-05-26 ENCOUNTER — Encounter (HOSPITAL_BASED_OUTPATIENT_CLINIC_OR_DEPARTMENT_OTHER): Payer: Medicare Other | Admitting: Pulmonary Disease

## 2020-05-26 LAB — CULTURE, BLOOD (ROUTINE X 2)
Culture: NO GROWTH
Culture: NO GROWTH
Special Requests: ADEQUATE

## 2020-05-28 ENCOUNTER — Encounter (HOSPITAL_COMMUNITY): Payer: Self-pay | Admitting: Emergency Medicine

## 2020-05-28 ENCOUNTER — Observation Stay (HOSPITAL_COMMUNITY)
Admission: EM | Admit: 2020-05-28 | Discharge: 2020-05-29 | Disposition: A | Discharge status: Home / Self Care | Payer: Medicare Other | Attending: Internal Medicine | Admitting: Internal Medicine

## 2020-05-28 DIAGNOSIS — R109 Unspecified abdominal pain: Secondary | ICD-10-CM

## 2020-05-28 DIAGNOSIS — Z79899 Other long term (current) drug therapy: Secondary | ICD-10-CM | POA: Diagnosis not present

## 2020-05-28 DIAGNOSIS — J45909 Unspecified asthma, uncomplicated: Secondary | ICD-10-CM | POA: Insufficient documentation

## 2020-05-28 DIAGNOSIS — R509 Fever, unspecified: Secondary | ICD-10-CM | POA: Diagnosis present

## 2020-05-28 DIAGNOSIS — I10 Essential (primary) hypertension: Secondary | ICD-10-CM | POA: Diagnosis not present

## 2020-05-28 DIAGNOSIS — N390 Urinary tract infection, site not specified: Principal | ICD-10-CM

## 2020-05-28 HISTORY — DX: Urinary tract infection, site not specified: N39.0

## 2020-05-28 HISTORY — DX: Unspecified abdominal pain: R10.9

## 2020-05-28 LAB — COMPREHENSIVE METABOLIC PANEL
ALT: 34 U/L (ref 0–44)
AST: 34 U/L (ref 15–41)
Albumin: 4 g/dL (ref 3.5–5.0)
Alkaline Phosphatase: 49 U/L (ref 38–126)
Anion gap: 11 (ref 5–15)
BUN: 11 mg/dL (ref 8–23)
CO2: 27 mmol/L (ref 22–32)
Calcium: 8.8 mg/dL — ABNORMAL LOW (ref 8.9–10.3)
Chloride: 100 mmol/L (ref 98–111)
Creatinine, Ser: 0.64 mg/dL (ref 0.44–1.00)
GFR, Estimated: >60 mL/min (ref >60)
Glucose, Bld: 113 mg/dL — ABNORMAL HIGH (ref 70–99)
Potassium: 3.4 mmol/L — ABNORMAL LOW (ref 3.5–5.1)
Sodium: 138 mmol/L (ref 135–145)
Total Bilirubin: 1 mg/dL (ref 0.3–1.2)
Total Protein: 6.9 g/dL (ref 6.5–8.1)

## 2020-05-28 LAB — CBC WITH DIFFERENTIAL/PLATELET
Abs Immature Granulocytes: 0.11 10*3/uL — ABNORMAL HIGH (ref 0.00–0.07)
Basophils Absolute: 0 10*3/uL (ref 0.0–0.1)
Basophils Relative: 0 %
Eosinophils Absolute: 0.1 10*3/uL (ref 0.0–0.5)
Eosinophils Relative: 1 %
HCT: 40.4 % (ref 36.0–46.0)
Hemoglobin: 12.9 g/dL (ref 12.0–15.0)
Immature Granulocytes: 1 %
Lymphocytes Relative: 3 %
Lymphs Abs: 0.3 10*3/uL — ABNORMAL LOW (ref 0.7–4.0)
MCH: 29.5 pg (ref 26.0–34.0)
MCHC: 31.9 g/dL (ref 30.0–36.0)
MCV: 92.4 fL (ref 80.0–100.0)
Monocytes Absolute: 0.5 10*3/uL (ref 0.1–1.0)
Monocytes Relative: 4 %
Neutro Abs: 10.9 10*3/uL — ABNORMAL HIGH (ref 1.7–7.7)
Neutrophils Relative %: 91 %
Platelets: 248 10*3/uL (ref 150–400)
RBC: 4.37 MIL/uL (ref 3.87–5.11)
RDW: 14.3 % (ref 11.5–15.5)
WBC: 11.9 10*3/uL — ABNORMAL HIGH (ref 4.0–10.5)
nRBC: 0 % (ref 0.0–0.2)

## 2020-05-28 LAB — URINALYSIS, ROUTINE W REFLEX MICROSCOPIC
Bilirubin Urine: NEGATIVE
Glucose, UA: NEGATIVE mg/dL
Hgb urine dipstick: NEGATIVE
Ketones, ur: NEGATIVE mg/dL
Leukocytes,Ua: NEGATIVE
Nitrite: NEGATIVE
Protein, ur: NEGATIVE mg/dL
Specific Gravity, Urine: 1.009 (ref 1.005–1.030)
pH: 5 (ref 5.0–8.0)

## 2020-05-28 LAB — LACTIC ACID, PLASMA
Lactic Acid, Venous: 2.4 mmol/L (ref 0.5–1.9)
Lactic Acid, Venous: 1.9 mmol/L (ref 0.5–1.9)

## 2020-05-28 MED ORDER — HYDROCHLOROTHIAZIDE 25 MG PO TABS
25.0000 mg | ORAL_TABLET | Freq: Every day | ORAL | Status: DC
  Filled 2020-05-28: qty 1

## 2020-05-28 MED ORDER — SODIUM CHLORIDE 0.9% FLUSH: 3.0000 mL | Freq: Two times a day (BID) | INTRAVENOUS | Status: DC

## 2020-05-28 MED ORDER — ACETAMINOPHEN 650 MG RE SUPP: 650.0000 mg | Freq: Four times a day (QID) | RECTAL | Status: DC | PRN

## 2020-05-28 MED ORDER — FLUTICASONE PROPIONATE 50 MCG/ACT NA SUSP: 1.0000 | Freq: Every day | NASAL | Status: DC | PRN

## 2020-05-28 MED ORDER — FLUTICASONE PROPIONATE HFA 110 MCG/ACT IN AERO
2.0000 | INHALATION_SPRAY | Freq: Two times a day (BID) | RESPIRATORY_TRACT | Status: DC
  Administered 2020-05-28: 2 via RESPIRATORY_TRACT
  Filled 2020-05-28: qty 12

## 2020-05-28 MED ORDER — OXYCODONE HCL 5 MG PO TABS: 5.0000 mg | ORAL_TABLET | ORAL | Status: DC | PRN

## 2020-05-28 MED ORDER — ACETAMINOPHEN 500 MG PO TABS
1000.0000 mg | ORAL_TABLET | Freq: Four times a day (QID) | ORAL | Status: DC | PRN
  Administered 2020-05-28 – 2020-05-29 (×2): 1000 mg via ORAL
  Filled 2020-05-28 (×2): qty 2

## 2020-05-28 MED ORDER — PANTOPRAZOLE SODIUM 40 MG PO TBEC: 40.0000 mg | DELAYED_RELEASE_TABLET | Freq: Every day | ORAL | Status: DC

## 2020-05-28 MED ORDER — ACETAMINOPHEN 325 MG PO TABS: 650.0000 mg | ORAL_TABLET | Freq: Four times a day (QID) | ORAL | Status: DC | PRN

## 2020-05-28 MED ORDER — ADULT MULTIVITAMIN W/MINERALS CH: 1.0000 | ORAL_TABLET | Freq: Every day | ORAL | Status: DC

## 2020-05-28 MED ORDER — MELATONIN 5 MG PO TABS: 5.0000 mg | ORAL_TABLET | Freq: Every day | ORAL | Status: DC

## 2020-05-28 MED ORDER — SODIUM CHLORIDE 0.9 % IV BOLUS
1000.0000 mL | Freq: Once | INTRAVENOUS | Status: AC
  Administered 2020-05-28: 1000 mL via INTRAVENOUS

## 2020-05-28 MED ORDER — ONDANSETRON HCL 4 MG PO TABS: 4.0000 mg | ORAL_TABLET | Freq: Four times a day (QID) | ORAL | Status: DC | PRN

## 2020-05-28 MED ORDER — LOSARTAN POTASSIUM 25 MG PO TABS: 50.0000 mg | ORAL_TABLET | Freq: Every day | ORAL | Status: DC

## 2020-05-28 MED ORDER — ROSUVASTATIN CALCIUM 20 MG PO TABS
20.0000 mg | ORAL_TABLET | Freq: Every day | ORAL | Status: DC
  Administered 2020-05-28: 20 mg via ORAL
  Filled 2020-05-28: qty 1

## 2020-05-28 MED ORDER — ALBUTEROL SULFATE HFA 108 (90 BASE) MCG/ACT IN AERS: 2.0000 | INHALATION_SPRAY | Freq: Four times a day (QID) | RESPIRATORY_TRACT | Status: DC | PRN

## 2020-05-28 MED ORDER — ONDANSETRON HCL 4 MG/2ML IJ SOLN: 4.0000 mg | Freq: Four times a day (QID) | INTRAMUSCULAR | Status: DC | PRN

## 2020-05-28 MED ORDER — SODIUM CHLORIDE 0.9 % IV SOLN: INTRAVENOUS | Status: DC

## 2020-05-28 MED ORDER — ALBUTEROL SULFATE (2.5 MG/3ML) 0.083% IN NEBU: 2.5000 mg | INHALATION_SOLUTION | Freq: Four times a day (QID) | RESPIRATORY_TRACT | Status: DC | PRN

## 2020-05-28 NOTE — ED Notes (Signed)
Date and time results received: 05/28/20 4:24 PM  Test: Lactic Acid Critical Value: 2.4  Name of Provider Notified: [place], EDP

## 2020-05-28 NOTE — Assessment & Plan Note (Addendum)
-  Patient has sensation of pressure/fullness in her lower abdomen.  She was not tender until she was palpated in her suprapubic region, at which point she has a firm palpable bladder which was tender for her - after observation overnight she had improved symptoms; discussed that symptoms will hopefully continue to improve at home - discussed with Dr. Diona Fanti; recommends outpt follow up; I have called office to work on expediting appointment; they will review with Dr. Diona Fanti to schedule appt

## 2020-05-28 NOTE — Hospital Course (Addendum)
Ms. Tonelli is a 69 yo female with PMH recurrent UTI (some ESBLs), asthma, hypertension, hyperlipidemia, OSA on CPAP at home, CAD (on Plavix) who presented to the ER with complaints of vomiting, body aches, light sensitivity.  She was recently hospitalized from 12/1 to 12/4 for a UTI (E faecalis, 4000 colonies).  She was discharged home on suppressive antibiotic with nitrofurantoin.  She did not tolerate 100 mg when initially tried and was reduced to 50 mg, however continues to have ongoing symptoms including light sensitivity, aches, and vomiting.  However, she states that light sensitivity is also a symptoms she gets when developing a UTI and that is part of her reason for presenting back to the ER tonight.  She has also had ongoing bladder pressure which is a another typical symptom for her.  She typically does not have any burning with urination or increased frequency.  She has had no fevers at home since discharge.  She underwent an appropriate course during last hospitalization.  Last hospitalization was her fourth UTI in 2 months which was the reason for prescribing nitrofurantoin at discharge.  Urinalysis in the ER was negative for nitrites, leukocytes, RBC, WBC.  We discussed staying in the hospital for observation overnight in case of developing fever or worsening urinary symptoms.  She did not meet criteria for antibiotics which I reviewed with her.  Her son was also bedside for our conversation.  She did not develop any fever overnight. Blood work remained stable and improved the following morning.  Her symptoms also improved. She decided to remain off of nitrofurantion at discharge given association of symptoms with doses at home. She was comfortable discharging home with ongoing monitoring of symptoms and outpatient follow up with urology. She was medically stable for discharge.

## 2020-05-28 NOTE — ED Provider Notes (Signed)
[facility] DEPT Provider Note   CSN: [id number] Arrival date & time: 05/28/20  1320     History Chief Complaint  Patient presents with  . Medication Reaction  . Urinary Tract Infection    Brittany Middleton is a 69 y.o. female.  Patient is a 69 year old female with past medical history of GERD, hypertension, hyperlipidemia, asthma, and recent admission x3 over the past month for UTI/urosepsis caused by ESBL.  Patient discharged 3 days ago with nitrofurantoin and fosfomycin.  Today she was at her gastroenterologist's office when she developed shortness of breath, weakness, and fever to 100.4.  She was then sent here for evaluation.  Temperature upon arrival is 99.7.  She reports continued pressure in her bladder, but denies any bloody urine.  She reports being compliant with the medications prescribed.  She does have Macrobid listed as an allergy, however was given a lower dose upon discharge as she seemed to tolerate this in the hospital.   The history is provided by the patient.  Urinary Tract Infection Pain quality:  Aching Pain severity:  Moderate Timing:  Constant Progression:  Unchanged Chronicity:  Recurrent Ineffective treatments:  None tried Associated symptoms: no abdominal pain        Past Medical History:  Diagnosis Date  . Acute blood loss anemia 07/17/2018  . Anemia 07/17/2018  . Ascending aortic aneurysm ([place]) 05/23/2019  . Asthma   . Cervical disc disease    MRI 2016  . Chest pain with moderate risk for cardiac etiology 02/27/2018  . 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 Gastrointestinal bleeding 07/25/2018  . Gastrointestinal hemorrhage with melena 07/16/2018  . GERD (gastroesophageal reflux disease)   . Hepatic steatosis    per imaging 2016, 2019  . Hiatal hernia   . History of palpitations    Holter monitor, 2017: NSR, occasional PVC, PACs, no arrhythmia  . HLD (hyperlipidemia) 02/27/2018  . HTN (hypertension) 02/27/2018  .  Hypercholesteremia   . Hypercholesterolemia 04/07/2017  . Hypertension   . Hypertensive disorder 04/07/2017  . Hypokalemia 07/17/2018  . Lumbar disc disease    MRI, 2017   . Morbid obesity ([place]) 05/22/2018  . Overweight 03/03/2018  . Palpitations 08/19/2015  . Pyelonephritis 03/30/2020  . Sepsis secondary to UTI ([place]) 04/25/2020  . Severe sepsis with acute organ dysfunction ([place]) 04/25/2020  . Sleep apnea   . UTI (urinary tract infection)     Patient Active Problem List   Diagnosis Date Noted  . Severe sepsis ([place]) 05/21/2020  . Asthma   . Sleep apnea   . Severe sepsis with acute organ dysfunction ([place]) 04/25/2020  . Sepsis secondary to UTI ([place]) 04/25/2020  . Cervical disc disease   . Hepatic steatosis   . History of palpitations   . Hypercholesteremia   . Hypertension   . Lumbar disc disease   . Urinary tract infection due to extended-spectrum beta lactamase (ESBL) producing Escherichia coli   . Pyelonephritis 03/30/2020  . Ascending aortic aneurysm ([place]) 05/23/2019  . Gastrointestinal bleeding 07/25/2018  . Hiatal hernia   . Anemia 07/17/2018  . Acute blood loss anemia 07/17/2018  . Hypokalemia 07/17/2018  . Gastrointestinal hemorrhage with melena 07/16/2018  . Morbid obesity ([place]) 05/22/2018  . Overweight 03/03/2018  . 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 Chest pain with moderate risk for cardiac etiology 02/27/2018  . HTN (hypertension) 02/27/2018  . HLD (hyperlipidemia) 02/27/2018  . Hypercholesterolemia 04/07/2017  . Hypertensive disorder 04/07/2017  . Palpitations 08/19/2015    Past Surgical  History:  Procedure Laterality Date  . BLADDER REPAIR    . BREAST EXCISIONAL BIOPSY Left   . CARPAL TUNNEL RELEASE Right   . CATARACT EXTRACTION, BILATERAL  2014  . COLONOSCOPY WITH PROPOFOL N/A 07/18/2018   Procedure: COLONOSCOPY WITH PROPOFOL;  Surgeon: Laurence Spates, MD;  Location: [facility];  Service: Endoscopy;  Laterality: N/A;  . ESOPHAGOGASTRODUODENOSCOPY (EGD)  WITH PROPOFOL N/A 07/17/2018   Procedure: ESOPHAGOGASTRODUODENOSCOPY (EGD) WITH PROPOFOL;  Surgeon: Laurence Spates, MD;  Location: [facility];  Service: Endoscopy;  Laterality: N/A;  . REPAIR RECTOCELE       OB History   No obstetric history on file.     Family History  Problem Relation Age of Onset  . CAD Mother   . Breast cancer Mother 69    Social History   Tobacco Use  . Smoking status: Never Smoker  . Smokeless tobacco: Never Used  Vaping Use  . Vaping Use: Never used  Substance Use Topics  . Alcohol use: No  . Drug use: No    Home Medications Prior to Admission medications   Medication Sig Start Date End Date Taking? Authorizing Provider  acetaminophen (TYLENOL) 500 MG tablet Take 1,000 mg by mouth every 6 (six) hours as needed for mild pain, moderate pain, fever or headache.    [provider]  albuterol (PROVENTIL) (2.5 MG/3ML) 0.083% nebulizer solution Take 2.5 mg by nebulization every 6 (six) hours as needed for wheezing or shortness of breath.    [provider]  albuterol (VENTOLIN HFA) 108 (90 Base) MCG/ACT inhaler Inhale 2 puffs into the lungs every 6 (six) hours as needed for wheezing or shortness of breath. 04/01/20   Regalado, Belkys A, MD  BIOTIN PO Take 1 tablet by mouth daily.    [provider]  Cholecalciferol (VITAMIN D-3) 25 MCG (1000 UT) CAPS Take 1,000 Units by mouth daily.    [provider]  clopidogrel (PLAVIX) 75 MG tablet Take 1 tablet (75 mg total) by mouth daily. Patient taking differently: Take 75 mg by mouth daily with supper.  06/28/19   Revankar, [name], MD  Coenzyme Q10-Vitamin E (QUNOL ULTRA COQ10 PO) Take 1 capsule by mouth daily.    [provider]  fluticasone (FLONASE) 50 MCG/ACT nasal spray Place 1 spray into both nostrils daily. Patient taking differently: Place 1 spray into both nostrils daily as needed for allergies or rhinitis.  04/01/20   Regalado, Belkys A, MD  fluticasone (FLOVENT  HFA) 110 MCG/ACT inhaler Inhale 2 puffs into the lungs 2 (two) times daily. 05/13/20 05/13/21  Freddi Starr, MD  hydrochlorothiazide (HYDRODIURIL) 25 MG tablet Take 1 tablet (25 mg total) by mouth daily. 04/29/20   Pokhrel, Corrie Mckusick, MD  hydrocortisone 2.5 % cream Apply 1 application topically daily as needed (forehead).  04/13/20   [provider]  IRON PO Take 1 tablet by mouth 2 (two) times a week.     [provider]  losartan (COZAAR) 50 MG tablet Take 1 tablet (50 mg total) by mouth daily. 04/30/20   Pokhrel, Corrie Mckusick, MD  Melatonin 5 MG CHEW Chew 5 mg by mouth at bedtime.     [provider]  Multiple Vitamins-Minerals (MULTIVITAMIN WITH MINERALS) tablet Take 1 tablet by mouth daily.    [provider]  nitrofurantoin (MACRODANTIN) 50 MG capsule Take 1 capsule (50 mg total) by mouth at bedtime. 05/28/20 08/26/20  Barb Merino, MD  pantoprazole (PROTONIX) 40 MG tablet Take 1 tablet (40 mg total)  by mouth daily. 04/01/20   Regalado, Cassie Freer, MD  PRESCRIPTION MEDICATION Inhale into the lungs at bedtime. CPAP    [provider]  rosuvastatin (CRESTOR) 20 MG tablet Take 1 tablet (20 mg total) by mouth daily. Patient taking differently: Take 20 mg by mouth daily after supper.  12/25/19 05/21/20  Revankar, [name], MD  TURMERIC PO Take 1 capsule by mouth daily.    [provider]    Allergies    Ciprofloxacin, Codeine, and Macrobid [nitrofurantoin]  Review of Systems   Review of Systems  Gastrointestinal: Negative for abdominal pain.  All other systems reviewed and are negative.   Physical Exam Updated Vital Signs BP (!) 155/71 (BP Location: Left Arm)   Pulse 76   Temp 99.7 F (37.6 C) (Oral)   Resp 20   SpO2 93%   Physical Exam Vitals and nursing note reviewed.  Constitutional:      General: She is not in acute distress.    Appearance: She is well-developed. She is not diaphoretic.  HENT:     Head: Normocephalic and  atraumatic.  Cardiovascular:     Rate and Rhythm: Normal rate and regular rhythm.     Heart sounds: No murmur heard.  No friction rub. No gallop.   Pulmonary:     Effort: Pulmonary effort is normal. No respiratory distress.     Breath sounds: Normal breath sounds. No wheezing.  Abdominal:     General: Bowel sounds are normal. There is no distension.     Palpations: Abdomen is soft.     Tenderness: There is abdominal tenderness. There is no right CVA tenderness, left CVA tenderness, guarding or rebound.     Comments: There is mild suprapubic ttp.  Musculoskeletal:        General: Normal range of motion.     Cervical back: Normal range of motion and neck supple.  Skin:    General: Skin is warm and dry.  Neurological:     Mental Status: She is alert and oriented to person, place, and time.     ED Results / Procedures / Treatments   Labs (all labs ordered are listed, but only abnormal results are displayed) Labs Reviewed  URINE CULTURE  URINALYSIS, ROUTINE W REFLEX MICROSCOPIC  COMPREHENSIVE METABOLIC PANEL  CBC WITH DIFFERENTIAL/PLATELET  LACTIC ACID, PLASMA  LACTIC ACID, PLASMA    EKG None  Radiology No results found.  Procedures Procedures (including critical care time)  Medications Ordered in ED Medications  sodium chloride 0.9 % bolus 1,000 mL (has no administration in time range)    ED Course  I have reviewed the triage vital signs and the nursing notes.  Pertinent labs & imaging results that were available during my care of the patient were reviewed by me and considered in my medical decision making (see chart for details).    MDM Rules/Calculators/A&P  Patient is a 69 year old female with 3 prior admissions in the past 6 weeks for sepsis related to ESBL UTI.  She was just discharged several days ago after receiving 3 days worth of meropenem.  She was discharged with Macrobid and fosfomycin.  Today, she developed the acute onset of weakness, feeling  chilled, and feeling similar to how she did with her prior presentations.  Her work-up today shows a white count of 12,000 with a lactate of 2.4.  Her urine otherwise appears clear.  Due to her recent history, I feel as though patient warrants admission for observation and IV antibiotics.  I have discussed the case with Dr. Cruzita Lederer who agrees to admit.  Final Clinical Impression(s) / ED Diagnoses Final diagnoses:  None    Rx / DC Orders ED Discharge Orders    None       Veryl Speak, MD 05/28/20 [phone]

## 2020-05-28 NOTE — ED Triage Notes (Signed)
Pt presents from home via [facility] after taking nitrofurantoin which she knows she's allergic to but was prescribed for a UTI anyway. N/V. No hives. States she has been hospitalized 3 times with ESBL urosepsis in the past year. Alert and oriented. VSS.

## 2020-05-28 NOTE — Assessment & Plan Note (Signed)
-   continue home meds 

## 2020-05-28 NOTE — H&P (Signed)
History and Physical    Brittany Middleton  KVQ:[id number]  DOB: October 10, 1950  DOA: 05/28/2020  PCP: Brittany Pepper, MD Patient coming from: home  Chief Complaint: bladder pressure, vomiting/aches/light sens  HPI:  Brittany Middleton is a 69 yo female with PMH recurrent UTI (some ESBLs), asthma, hypertension, hyperlipidemia, OSA on CPAP at home, CAD (on Plavix) who presented to the ER with complaints of vomiting, body aches, light sensitivity.  She was recently hospitalized from 12/1 to 12/4 for a UTI (E faecalis, 4000 colonies).  She was discharged home on suppressive antibiotic with nitrofurantoin.  She did not tolerate 100 mg when initially tried and was reduced to 50 mg, however continues to have ongoing symptoms including light sensitivity, aches, and vomiting.  However, she states that light sensitivity is also a symptoms she gets when developing a UTI and that is part of her reason for presenting back to the ER tonight.  She has also had ongoing bladder pressure which is a another typical symptom for her.  She typically does not have any burning with urination or increased frequency.  She has had no fevers at home since discharge.  She underwent an appropriate course during last hospitalization.  Last hospitalization was her fourth UTI in 2 months which was the reason for prescribing nitrofurantoin at discharge.  Urinalysis in the ER was negative for nitrites, leukocytes, RBC, WBC.  We discussed bringing into the hospital for observation overnight in case of developing fever or worsening urinary symptoms.  She did not meet criteria for antibiotics which I reviewed with her.  Her son was also bedside for our conversation.  She did request for infectious disease input regarding her recurrent UTIs as well as urology evaluation while here.  She also is wondering why her bladder is palpable with no urinary retention and still tender.   I have personally briefly reviewed patient's old medical records in  [facility] and discussed patient with the ER provider when appropriate/indicated.  Assessment/Plan: * Abdominal pain -Patient has sensation of pressure/fullness in her lower abdomen.  She was not tender until she was palpated in her suprapubic region, at which point she has a firm palpable bladder which was tender for her -Hold off on further imaging at this point as she has had multiple CTs recently.  Recent UTIs would predispose for thickened bladder wall which could be contributing to her symptoms from a resolving infection -Will consult urology for further input regarding nitrofurantoin intolerance as well as if any indication for cystoscopy at this point -Continue supportive care and pain control for now otherwise  Recurrent UTI -Patient has had 4 UTIs in last 2 months.  Discharged on 05/24/2020 on nitrofurantoin 50 mg as she did not tolerate 100 mg.  She was tolerating 50 mg prior to discharge but upon returning home started developing vomiting, aches, and worsening light sensitivity -Patient requesting ID input regarding need for suppressive prophylaxis and choice of antibiotic if so -Patient also requesting urology evaluation while in hospital again and/or if any indication for cystoscopy -Urinalysis is negative on admission.  I suspect her symptoms are lingering from resolving UTI from recent hospitalization, although cannot discount her symptoms that she endorses are prodromes of past infections.  Observe overnight for any worsening of symptoms and/or development of fever -Continue on fluids.  Patient states she is voiding well with no history of urinary retention  HTN (hypertension) - continue home meds     Code Status: Full DVT Prophylaxis: SCD Anticipated disposition  is to: Home  History: Past Medical History:  Diagnosis Date  . Acute blood loss anemia 07/17/2018  . Anemia 07/17/2018  . Ascending aortic aneurysm ([place]) 05/23/2019  . Asthma   . Cervical disc disease     MRI 2016  . Chest pain with moderate risk for cardiac etiology 02/27/2018  . 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 Gastrointestinal bleeding 07/25/2018  . Gastrointestinal hemorrhage with melena 07/16/2018  . GERD (gastroesophageal reflux disease)   . Hepatic steatosis    per imaging 2016, 2019  . Hiatal hernia   . History of palpitations    Holter monitor, 2017: NSR, occasional PVC, PACs, no arrhythmia  . HLD (hyperlipidemia) 02/27/2018  . HTN (hypertension) 02/27/2018  . Hypercholesteremia   . Hypercholesterolemia 04/07/2017  . Hypertension   . Hypertensive disorder 04/07/2017  . Hypokalemia 07/17/2018  . Lumbar disc disease    MRI, 2017   . Morbid obesity ([place]) 05/22/2018  . Overweight 03/03/2018  . Palpitations 08/19/2015  . Pyelonephritis 03/30/2020  . Sepsis secondary to UTI ([place]) 04/25/2020  . Severe sepsis with acute organ dysfunction ([place]) 04/25/2020  . Sleep apnea   . UTI (urinary tract infection)     Past Surgical History:  Procedure Laterality Date  . BLADDER REPAIR    . BREAST EXCISIONAL BIOPSY Left   . CARPAL TUNNEL RELEASE Right   . CATARACT EXTRACTION, BILATERAL  2014  . COLONOSCOPY WITH PROPOFOL N/A 07/18/2018   Procedure: COLONOSCOPY WITH PROPOFOL;  Surgeon: Laurence Spates, MD;  Location: [facility];  Service: Endoscopy;  Laterality: N/A;  . ESOPHAGOGASTRODUODENOSCOPY (EGD) WITH PROPOFOL N/A 07/17/2018   Procedure: ESOPHAGOGASTRODUODENOSCOPY (EGD) WITH PROPOFOL;  Surgeon: Laurence Spates, MD;  Location: [facility];  Service: Endoscopy;  Laterality: N/A;  . REPAIR RECTOCELE       reports that she has never smoked. She has never used smokeless tobacco. She reports that she does not drink alcohol and does not use drugs.  Allergies  Allergen Reactions  . Ciprofloxacin Other (See Comments)    Caused PAIN IN HANDS   . Codeine Nausea And Vomiting  . Macrobid [Nitrofurantoin] Nausea Only    Family History  Problem Relation Age of Onset  . CAD Mother   . Breast cancer  Mother 88   Home Medications: Prior to Admission medications   Medication Sig Start Date End Date Taking? Authorizing Provider  acetaminophen (TYLENOL) 500 MG tablet Take 1,000 mg by mouth every 6 (six) hours as needed for mild pain, moderate pain, fever or headache.   Yes [provider]  albuterol (PROVENTIL) (2.5 MG/3ML) 0.083% nebulizer solution Take 2.5 mg by nebulization every 6 (six) hours as needed for wheezing or shortness of breath.   Yes [provider]  albuterol (VENTOLIN HFA) 108 (90 Base) MCG/ACT inhaler Inhale 2 puffs into the lungs every 6 (six) hours as needed for wheezing or shortness of breath. 04/01/20  Yes Regalado, Belkys A, MD  BIOTIN PO Take 1 tablet by mouth daily.   Yes [provider]  Cholecalciferol (VITAMIN D-3) 25 MCG (1000 UT) CAPS Take 1,000 Units by mouth daily.   Yes [provider]  clopidogrel (PLAVIX) 75 MG tablet Take 1 tablet (75 mg total) by mouth daily. Patient taking differently: Take 75 mg by mouth daily with supper.  06/28/19  Yes Revankar, [name], MD  Coenzyme Q10-Vitamin E (QUNOL ULTRA COQ10 PO) Take 1 capsule by mouth daily.   Yes [provider]  fluticasone (FLONASE) 50 MCG/ACT nasal spray Place 1  spray into both nostrils daily. Patient taking differently: Place 1 spray into both nostrils daily as needed for allergies or rhinitis.  04/01/20  Yes Regalado, Belkys A, MD  fluticasone (FLOVENT HFA) 110 MCG/ACT inhaler Inhale 2 puffs into the lungs 2 (two) times daily. 05/13/20 05/13/21 Yes Freddi Starr, MD  fosfomycin (MONUROL) 3 g PACK Take 3 g by mouth once.   Yes [provider]  hydrochlorothiazide (HYDRODIURIL) 25 MG tablet Take 1 tablet (25 mg total) by mouth daily. 04/29/20  Yes Pokhrel, Laxman, MD  hydrocortisone 2.5 % cream Apply 1 application topically daily as needed (forehead).  04/13/20  Yes [provider]  IRON PO Take 1 tablet by mouth 2 (two) times a week. Monday,  Wednesday   Yes [provider]  losartan (COZAAR) 50 MG tablet Take 1 tablet (50 mg total) by mouth daily. 04/30/20  Yes Pokhrel, Laxman, MD  Melatonin 5 MG CHEW Chew 5 mg by mouth at bedtime.    Yes [provider]  Multiple Vitamins-Minerals (MULTIVITAMIN WITH MINERALS) tablet Take 1 tablet by mouth daily.   Yes [provider]  nitrofurantoin (MACRODANTIN) 50 MG capsule Take 1 capsule (50 mg total) by mouth at bedtime. 05/28/20 08/26/20 Yes Ghimire, Dante Gang, MD  pantoprazole (PROTONIX) 40 MG tablet Take 1 tablet (40 mg total) by mouth daily. 04/01/20  Yes Regalado, Belkys A, MD  PRESCRIPTION MEDICATION Inhale into the lungs at bedtime. CPAP   Yes [provider]  Probiotic Product (PROBIOTIC-10 PO) Take 1 capsule by mouth daily.   Yes [provider]  rosuvastatin (CRESTOR) 20 MG tablet Take 1 tablet (20 mg total) by mouth daily. Patient taking differently: Take 20 mg by mouth daily after supper.  12/25/19 05/28/20 Yes Revankar, [name], MD  TURMERIC PO Take 1 capsule by mouth daily.   Yes [provider]    Review of Systems:  Pertinent items noted in HPI and remainder of comprehensive ROS otherwise negative.  Physical Exam: Vitals:   05/28/20 1600 05/28/20 1630 05/28/20 1700 05/28/20 1800  BP: (!) 120/109  119/62 127/70  Pulse: 76 70 69 69  Resp: 18  16 17   Temp:      TempSrc:      SpO2: 93% 96% 92% 93%   General appearance: alert, cooperative and no distress Head: Normocephalic, without obvious abnormality, atraumatic Eyes: EOMI Lungs: clear to auscultation bilaterally Heart: regular rate and rhythm and S1, S2 normal Abdomen: obese, soft, NT, BS present. Bladder is palpable and firm which was tender when palpated Extremities: no edema Skin: mobility and turgor normal Neurologic: Grossly normal  Labs on Admission:  I have personally reviewed following labs and imaging studies Results for orders placed or performed during the  hospital encounter of 05/28/20 (from the past 24 hour(s))  Urinalysis, Routine w reflex microscopic     Status: None   Collection Time: 05/28/20  2:53 PM  Result Value Ref Range   Color, Urine YELLOW YELLOW   APPearance CLEAR CLEAR   Specific Gravity, Urine 1.009 1.005 - 1.030   pH 5.0 5.0 - 8.0   Glucose, UA NEGATIVE NEGATIVE mg/dL   Hgb urine dipstick NEGATIVE NEGATIVE   Bilirubin Urine NEGATIVE NEGATIVE   Ketones, ur NEGATIVE NEGATIVE mg/dL   Protein, ur NEGATIVE NEGATIVE mg/dL   Nitrite NEGATIVE NEGATIVE   Leukocytes,Ua NEGATIVE NEGATIVE  Comprehensive metabolic panel     Status: Abnormal   Collection Time: 05/28/20  3:46 PM  Result Value Ref Range  Sodium 138 135 - 145 mmol/L   Potassium 3.4 (L) 3.5 - 5.1 mmol/L   Chloride 100 98 - 111 mmol/L   CO2 27 22 - 32 mmol/L   Glucose, Bld 113 (H) 70 - 99 mg/dL   BUN 11 8 - 23 mg/dL   Creatinine, Ser 0.64 0.44 - 1.00 mg/dL   Calcium 8.8 (L) 8.9 - 10.3 mg/dL   Total Protein 6.9 6.5 - 8.1 g/dL   Albumin 4.0 3.5 - 5.0 g/dL   AST 34 15 - 41 U/L   ALT 34 0 - 44 U/L   Alkaline Phosphatase 49 38 - 126 U/L   Total Bilirubin 1.0 0.3 - 1.2 mg/dL   GFR, Estimated >60 >60 mL/min   Anion gap 11 5 - 15  CBC with Differential     Status: Abnormal   Collection Time: 05/28/20  3:46 PM  Result Value Ref Range   WBC 11.9 (H) 4.0 - 10.5 K/uL   RBC 4.37 3.87 - 5.11 MIL/uL   Hemoglobin 12.9 12.0 - 15.0 g/dL   HCT 40.4 36 - 46 %   MCV 92.4 80.0 - 100.0 fL   MCH 29.5 26.0 - 34.0 pg   MCHC 31.9 30.0 - 36.0 g/dL   RDW 14.3 11.5 - 15.5 %   Platelets 248 150 - 400 K/uL   nRBC 0.0 0.0 - 0.2 %   Neutrophils Relative % 91 %   Neutro Abs 10.9 (H) 1.7 - 7.7 K/uL   Lymphocytes Relative 3 %   Lymphs Abs 0.3 (L) 0.7 - 4.0 K/uL   Monocytes Relative 4 %   Monocytes Absolute 0.5 0.1 - 1.0 K/uL   Eosinophils Relative 1 %   Eosinophils Absolute 0.1 0.0 - 0.5 K/uL   Basophils Relative 0 %   Basophils Absolute 0.0 0.0 - 0.1 K/uL   Immature Granulocytes 1  %   Abs Immature Granulocytes 0.11 (H) 0.00 - 0.07 K/uL  Lactic acid, plasma     Status: Abnormal   Collection Time: 05/28/20  3:46 PM  Result Value Ref Range   Lactic Acid, Venous 2.4 (HH) 0.5 - 1.9 mmol/L  Lactic acid, plasma     Status: None   Collection Time: 05/28/20  3:46 PM  Result Value Ref Range   Lactic Acid, Venous 1.9 0.5 - 1.9 mmol/L     Radiological Exams on Admission: No results found. No orders to display    Consults called:  Urology   Dwyane Dee, MD [facility] 05/28/2020, 6:44 PM

## 2020-05-28 NOTE — Assessment & Plan Note (Addendum)
-  Patient has had 4 UTIs in last 2 months.  Discharged on 05/24/2020 on nitrofurantoin 50 mg as she did not tolerate 100 mg.  She was tolerating 50 mg prior to discharge but upon returning home started developing vomiting, aches, and worsening light sensitivity -Symptoms improved overnight after observation - see above; patient will follow up outpt with Dr. Diona Fanti - discontinue nitrofurantoin as this seems to be associated with her recurrence of symptoms as well; patient amenable to this - if recurrent UTI soon in future, would consult ID for further input regarding choice of suppressive abx therapy if indicated at that time

## 2020-05-29 DIAGNOSIS — N390 Urinary tract infection, site not specified: Secondary | ICD-10-CM | POA: Diagnosis not present

## 2020-05-29 DIAGNOSIS — R109 Unspecified abdominal pain: Secondary | ICD-10-CM | POA: Diagnosis not present

## 2020-05-29 LAB — CBC WITH DIFFERENTIAL/PLATELET
Abs Immature Granulocytes: 0.02 10*3/uL (ref 0.00–0.07)
Basophils Absolute: 0 10*3/uL (ref 0.0–0.1)
Basophils Relative: 1 %
Eosinophils Absolute: 0.2 10*3/uL (ref 0.0–0.5)
Eosinophils Relative: 4 %
HCT: 36.9 % (ref 36.0–46.0)
Hemoglobin: 11.5 g/dL — ABNORMAL LOW (ref 12.0–15.0)
Immature Granulocytes: 0 %
Lymphocytes Relative: 14 %
Lymphs Abs: 0.9 10*3/uL (ref 0.7–4.0)
MCH: 29.2 pg (ref 26.0–34.0)
MCHC: 31.2 g/dL (ref 30.0–36.0)
MCV: 93.7 fL (ref 80.0–100.0)
Monocytes Absolute: 0.3 10*3/uL (ref 0.1–1.0)
Monocytes Relative: 5 %
Neutro Abs: 4.6 10*3/uL (ref 1.7–7.7)
Neutrophils Relative %: 76 %
Platelets: 230 10*3/uL (ref 150–400)
RBC: 3.94 MIL/uL (ref 3.87–5.11)
RDW: 14.6 % (ref 11.5–15.5)
WBC: 6 10*3/uL (ref 4.0–10.5)
nRBC: 0 % (ref 0.0–0.2)

## 2020-05-29 LAB — MAGNESIUM: Magnesium: 2 mg/dL (ref 1.7–2.4)

## 2020-05-29 LAB — URINE CULTURE: Culture: NO GROWTH

## 2020-05-29 LAB — BASIC METABOLIC PANEL
Anion gap: 5 (ref 5–15)
BUN: 7 mg/dL — ABNORMAL LOW (ref 8–23)
CO2: 29 mmol/L (ref 22–32)
Calcium: 8.3 mg/dL — ABNORMAL LOW (ref 8.9–10.3)
Chloride: 105 mmol/L (ref 98–111)
Creatinine, Ser: 0.65 mg/dL (ref 0.44–1.00)
GFR, Estimated: >60 mL/min (ref >60)
Glucose, Bld: 101 mg/dL — ABNORMAL HIGH (ref 70–99)
Potassium: 3.5 mmol/L (ref 3.5–5.1)
Sodium: 139 mmol/L (ref 135–145)

## 2020-05-29 NOTE — ED Notes (Signed)
Family updated regarding plan of care-Dr Gircuis to see patient and discharge from ED-patient does not want to resume Macrobid

## 2020-05-29 NOTE — ED Notes (Signed)
IV d/c'd with cath tip intact. Instructions given for discharge. Verbalized understanding.

## 2020-05-29 NOTE — Discharge Summary (Signed)
Physician Discharge Summary   Brittany Middleton MVE:[id number] DOB: November 29, 1950 DOA: 05/28/2020  PCP: London Pepper, MD  Admit date: 05/28/2020 Discharge date: 05/29/2020  Admitted From: home Disposition:  home Admitting physician: Dwyane Dee, MD Discharging physician: Dwyane Dee, MD  Recommendations for Outpatient Follow-up:  1. Follow up with urology  Patient discharged to home in Discharge Condition: stable CODE STATUS: Full Diet recommendation:  Diet Orders (From admission, onward)    Start     Ordered   05/29/20 0000  Diet general        05/29/20 0838          Hospital Course: [name] is a 69 yo female with PMH recurrent UTI (some ESBLs), asthma, hypertension, hyperlipidemia, OSA on CPAP at home, CAD (on Plavix) who presented to the ER with complaints of vomiting, body aches, light sensitivity.  She was recently hospitalized from 12/1 to 12/4 for a UTI (E faecalis, 4000 colonies).  She was discharged home on suppressive antibiotic with nitrofurantoin.  She did not tolerate 100 mg when initially tried and was reduced to 50 mg, however continues to have ongoing symptoms including light sensitivity, aches, and vomiting.  However, she states that light sensitivity is also a symptoms she gets when developing a UTI and that is part of her reason for presenting back to the ER tonight.  She has also had ongoing bladder pressure which is a another typical symptom for her.  She typically does not have any burning with urination or increased frequency.  She has had no fevers at home since discharge.  She underwent an appropriate course during last hospitalization.  Last hospitalization was her fourth UTI in 2 months which was the reason for prescribing nitrofurantoin at discharge.  Urinalysis in the ER was negative for nitrites, leukocytes, RBC, WBC.  We discussed staying in the hospital for observation overnight in case of developing fever or worsening urinary symptoms.  She did  not meet criteria for antibiotics which I reviewed with her.  Her son was also bedside for our conversation.  She did not develop any fever overnight. Blood work remained stable and improved the following morning.  Her symptoms also improved. She decided to remain off of nitrofurantion at discharge given association of symptoms with doses at home. She was comfortable discharging home with ongoing monitoring of symptoms and outpatient follow up with urology. She was medically stable for discharge.    * Abdominal pain -Patient has sensation of pressure/fullness in her lower abdomen.  She was not tender until she was palpated in her suprapubic region, at which point she has a firm palpable bladder which was tender for her - after observation overnight she had improved symptoms; discussed that symptoms will hopefully continue to improve at home - discussed with Dr. Diona Fanti; recommends outpt follow up; I have called office to work on expediting appointment; they will review with Dr. Diona Fanti to schedule appt  Recurrent UTI -Patient has had 4 UTIs in last 2 months.  Discharged on 05/24/2020 on nitrofurantoin 50 mg as she did not tolerate 100 mg.  She was tolerating 50 mg prior to discharge but upon returning home started developing vomiting, aches, and worsening light sensitivity -Symptoms improved overnight after observation - see above; patient will follow up outpt with Dr. Diona Fanti - discontinue nitrofurantoin as this seems to be associated with her recurrence of symptoms as well; patient amenable to this - if recurrent UTI soon in future, would consult ID for further input regarding choice of  suppressive abx therapy if indicated at that time   HTN (hypertension) - continue home meds    The patient's chronic medical conditions were treated accordingly per the patient's home medication regimen except as noted.  On day of discharge, patient was felt deemed stable for discharge. Patient/family  member advised to call PCP or come back to ER if needed.   Principal Diagnosis: Abdominal pain  Discharge Diagnoses: Active Hospital Problems   Diagnosis Date Noted  . Abdominal pain 05/28/2020    Priority: High  . Recurrent UTI 05/28/2020    Priority: Medium  . HTN (hypertension) 02/27/2018    Resolved Hospital Problems  No resolved problems to display.    Discharge Instructions    Diet general   Complete by: As directed    Increase activity slowly   Complete by: As directed      Allergies as of 05/29/2020      Reactions   Ciprofloxacin Other (See Comments)   Caused PAIN IN HANDS    Codeine Nausea And Vomiting   Macrobid [nitrofurantoin] Nausea Only      Medication List    STOP taking these medications   fosfomycin 3 g Pack Commonly known as: MONUROL   nitrofurantoin 50 MG capsule Commonly known as: MACRODANTIN     TAKE these medications   acetaminophen 500 MG tablet Commonly known as: TYLENOL Take 1,000 mg by mouth every 6 (six) hours as needed for mild pain, moderate pain, fever or headache.   albuterol (2.5 MG/3ML) 0.083% nebulizer solution Commonly known as: PROVENTIL Take 2.5 mg by nebulization every 6 (six) hours as needed for wheezing or shortness of breath.   albuterol 108 (90 Base) MCG/ACT inhaler Commonly known as: VENTOLIN HFA Inhale 2 puffs into the lungs every 6 (six) hours as needed for wheezing or shortness of breath.   BIOTIN PO Take 1 tablet by mouth daily.   clopidogrel 75 MG tablet Commonly known as: PLAVIX Take 1 tablet (75 mg total) by mouth daily. What changed: when to take this   fluticasone 110 MCG/ACT inhaler Commonly known as: FLOVENT HFA Inhale 2 puffs into the lungs 2 (two) times daily.   fluticasone 50 MCG/ACT nasal spray Commonly known as: FLONASE Place 1 spray into both nostrils daily. What changed:   when to take this  reasons to take this   hydrochlorothiazide 25 MG tablet Commonly known as:  HYDRODIURIL Take 1 tablet (25 mg total) by mouth daily.   hydrocortisone 2.5 % cream Apply 1 application topically daily as needed (forehead).   IRON PO Take 1 tablet by mouth 2 (two) times a week. Monday, Wednesday   losartan 50 MG tablet Commonly known as: COZAAR Take 1 tablet (50 mg total) by mouth daily.   Melatonin 5 MG Chew Chew 5 mg by mouth at bedtime.   multivitamin with minerals tablet Take 1 tablet by mouth daily.   pantoprazole 40 MG tablet Commonly known as: PROTONIX Take 1 tablet (40 mg total) by mouth daily.   PRESCRIPTION MEDICATION Inhale into the lungs at bedtime. CPAP   PROBIOTIC-10 PO Take 1 capsule by mouth daily.   QUNOL ULTRA COQ10 PO Take 1 capsule by mouth daily.   rosuvastatin 20 MG tablet Commonly known as: CRESTOR Take 1 tablet (20 mg total) by mouth daily. What changed: when to take this   TURMERIC PO Take 1 capsule by mouth daily.   Vitamin D-3 25 MCG (1000 UT) Caps Take 1,000 Units by mouth daily.  Allergies  Allergen Reactions  . Ciprofloxacin Other (See Comments)    Caused PAIN IN HANDS   . Codeine Nausea And Vomiting  . Macrobid [Nitrofurantoin] Nausea Only    Consultations: none  Discharge Exam: BP 137/71 (BP Location: Right Arm)   Pulse 63   Temp 97.9 F (36.6 C) (Oral)   Resp 18   SpO2 93%  General appearance: alert, cooperative and no distress Head: Normocephalic, without obvious abnormality, atraumatic Eyes: EOMI Lungs: clear to auscultation bilaterally Heart: regular rate and rhythm and S1, S2 normal Abdomen: obese, soft, NT, BS present. Bladder is palpable and firm Extremities: no edema Skin: mobility and turgor normal Neurologic: Grossly normal  The results of significant diagnostics from this hospitalization (including imaging, microbiology, ancillary and laboratory) are listed below for reference.   Microbiology: Recent Results (from the past 240 hour(s))  Urine Culture     Status: Abnormal    Collection Time: 05/20/20 12:00 AM   Specimen: Urine, Clean Catch   Urine  Release to pati  Result Value Ref Range Status   Urine Culture, Routine Final report (A)  Final   Organism ID, Bacteria Escherichia coli (A)  Final    Comment: 50,000-100,000 colony forming units per mL Cefazolin with an MIC <=16 predicts susceptibility to the oral agents cefaclor, cefdinir, cefpodoxime, cefprozil, cefuroxime, cephalexin, and loracarbef when used for therapy of uncomplicated urinary tract infections due to E. coli, Klebsiella pneumoniae, and Proteus mirabilis.    ORGANISM ID, BACTERIA Comment  Final    Comment: Mixed urogenital flora 10,000-25,000 colony forming units per mL    Antimicrobial Susceptibility Comment  Final    Comment:       ** S = Susceptible; I = Intermediate; R = Resistant **                    P = Positive; N = Negative             MICS are expressed in micrograms per mL    Antibiotic                 RSLT#1    RSLT#2    RSLT#3    RSLT#4 Amoxicillin/Clavulanic Acid    S Ampicillin                     R Cefazolin                      S Cefepime                       S Ceftriaxone                    S Cefuroxime                     S Ciprofloxacin                  S Ertapenem                      S Gentamicin                     S Imipenem                       S Levofloxacin  S Meropenem                      S Nitrofurantoin                 S Piperacillin/Tazobactam        S Tetracycline                   S Tobramycin                     S Trimethoprim/Sulfa             S   Microscopic Examination     Status: Abnormal   Collection Time: 05/20/20  2:22 PM   Urine  Result Value Ref Range Status   WBC, UA 0-5 0 - 5 /hpf Final   RBC 3-10 (A) 0 - 2 /hpf Final   Epithelial Cells (non renal) 0-10 0 - 10 /hpf Final   Renal Epithel, UA None seen None seen /hpf Final   Bacteria, UA Few (A) None seen/Few Final  Blood Culture (routine x 2)     Status:  None   Collection Time: 05/21/20  3:31 PM   Specimen: BLOOD  Result Value Ref Range Status   Specimen Description   Final    BLOOD LEFT ANTECUBITAL Performed at [facility] [address], [place], [place] [address]    Special Requests   Final    BOTTLES DRAWN AEROBIC AND ANAEROBIC Blood Culture adequate volume Performed at [facility] [address] [place], [place] [address]    Culture   Final    NO GROWTH 5 DAYS Performed at [facility], [place] [address] [place], [place] [address]    Report Status 05/26/2020 FINAL  Final  Blood Culture (routine x 2)     Status: None   Collection Time: 05/21/20  3:45 PM   Specimen: BLOOD  Result Value Ref Range Status   Specimen Description   Final    BLOOD RIGHT ANTECUBITAL Performed at [facility] [address] [place], [place] [address]    Special Requests   Final    BOTTLES DRAWN AEROBIC AND ANAEROBIC Blood Culture results may not be optimal due to an excessive volume of blood received in culture bottles Performed at [facility] [address] [place], [place] [address]    Culture   Final    NO GROWTH 5 DAYS Performed at [facility], [place] [address] [place], [place] [address]    Report Status 05/26/2020 FINAL  Final  Resp Panel by RT-PCR (Flu A&B, Covid) Nasopharyngeal Swab     Status: None   Collection Time: 05/21/20  5:30 PM   Specimen: Nasopharyngeal Swab; Nasopharyngeal(NP) swabs in vial transport medium  Result Value Ref Range Status   SARS Coronavirus 2 by RT PCR NEGATIVE NEGATIVE Final    Comment: (NOTE) SARS-CoV-2 target nucleic acids are NOT DETECTED.  The SARS-CoV-2 RNA is generally detectable in upper respiratory specimens during the acute phase of infection. The lowest concentration of SARS-CoV-2 viral copies this assay can detect is 138 copies/mL. A negative result does not preclude SARS-Cov-2 infection and should not  be used as the sole basis for treatment or other patient management decisions. A negative result may occur with  improper specimen collection/handling, submission of specimen other than nasopharyngeal swab, presence of viral mutation(s) within the areas targeted by this assay, and  inadequate number of viral copies(<138 copies/mL). A negative result must be combined with clinical observations, patient history, and epidemiological information. The expected result is Negative.  Fact Sheet for Patients:  EntrepreneurPulse.com.au  Fact Sheet for Healthcare Providers:  IncredibleEmployment.be  This test is no t yet approved or cleared by the [place] FDA and  has been authorized for detection and/or diagnosis of SARS-CoV-2 by FDA under an Emergency Use Authorization (EUA). This EUA will remain  in effect (meaning this test can be used) for the duration of the COVID-19 declaration under Section 564(b)(1) of the Act, 21 U.S.C.section 360bbb-3(b)(1), unless the authorization is terminated  or revoked sooner.       Influenza A by PCR NEGATIVE NEGATIVE Final   Influenza B by PCR NEGATIVE NEGATIVE Final    Comment: (NOTE) The Xpert Xpress SARS-CoV-2/FLU/RSV plus assay is intended as an aid in the diagnosis of influenza from Nasopharyngeal swab specimens and should not be used as a sole basis for treatment. Nasal washings and aspirates are unacceptable for Xpert Xpress SARS-CoV-2/FLU/RSV testing.  Fact Sheet for Patients: EntrepreneurPulse.com.au  Fact Sheet for Healthcare Providers: IncredibleEmployment.be  This test is not yet approved or cleared by the [place] FDA and has been authorized for detection and/or diagnosis of SARS-CoV-2 by FDA under an Emergency Use Authorization (EUA). This EUA will remain in effect (meaning this test can be used) for the duration of the COVID-19 declaration under Section  564(b)(1) of the Act, 21 U.S.C. section 360bbb-3(b)(1), unless the authorization is terminated or revoked.  Performed at [facility], [place] [address], [place], [place] [address]   Urine culture     Status: Abnormal   Collection Time: 05/21/20  6:18 PM   Specimen: In/Out Cath Urine  Result Value Ref Range Status   Specimen Description   Final    IN/OUT CATH URINE Performed at [facility] [address] [place], [place] [address]    Special Requests   Final    NONE Performed at [facility], [place] [address] [place], [place] [address]    Culture 4,000 COLONIES/mL ENTEROCOCCUS FAECALIS (A)  Final   Report Status 05/25/2020 FINAL  Final   Organism ID, Bacteria ENTEROCOCCUS FAECALIS (A)  Final      Susceptibility   Enterococcus faecalis - MIC*    AMPICILLIN <=2 SENSITIVE Sensitive     NITROFURANTOIN <=16 SENSITIVE Sensitive     VANCOMYCIN 1 SENSITIVE Sensitive     * 4,000 COLONIES/mL ENTEROCOCCUS FAECALIS  Urine culture     Status: None   Collection Time: 05/28/20  2:53 PM   Specimen: Urine, Clean Catch  Result Value Ref Range Status   Specimen Description   Final    URINE, CLEAN CATCH Performed at [facility], [place] [address], [place], [place] [address]    Special Requests   Final    NONE Performed at [facility], [place] [address], [place], [place] [address]    Culture   Final    NO GROWTH Performed at [facility], [place] [address] [place], [place] [address]    Report Status 05/29/2020 FINAL  Final     Labs: BNP (last 3 results) Recent Labs    03/30/20 1708 04/26/20 0532 05/21/20 1545  BNP 95.5 124.2* 332.9*   Basic Metabolic Panel: Recent Labs  Lab 05/24/20 0423 05/28/20 1546 05/29/20 0500  NA 139 138 139  K 3.6 3.4* 3.5  CL 103 100 105  CO2 24 27 29   GLUCOSE 107* 113* 101*  BUN 6* 11 7*  CREATININE 0.54 0.64 0.65  CALCIUM 8.4* 8.8* 8.3*  MG 1.9  --   2.0   Liver Function Tests: Recent Labs  Lab 05/28/20 1546  AST 34  ALT 34  ALKPHOS 49  BILITOT 1.0  PROT 6.9  ALBUMIN 4.0   No results for input(s): LIPASE, AMYLASE in the last 168 hours. No results for input(s): AMMONIA in the last 168 hours. CBC: Recent Labs  Lab 05/24/20 0423 05/28/20 1546 05/29/20 0500  WBC 6.3 11.9* 6.0  NEUTROABS 4.1 10.9* 4.6  HGB 12.0 12.9 11.5*  HCT 38.0 40.4 36.9  MCV 92.5 92.4 93.7  PLT 172 248 230   Cardiac Enzymes: No results for input(s): CKTOTAL, CKMB, CKMBINDEX, TROPONINI in the last 168 hours. BNP: Invalid input(s): POCBNP CBG: No results for input(s): GLUCAP in the last 168 hours. D-Dimer No results for input(s): DDIMER in the last 72 hours. Hgb A1c No results for input(s): HGBA1C in the last 72 hours. Lipid Profile No results for input(s): CHOL, HDL, LDLCALC, TRIG, CHOLHDL, LDLDIRECT in the last 72 hours. Thyroid function studies No results for input(s): TSH, T4TOTAL, T3FREE, THYROIDAB in the last 72 hours.  Invalid input(s): FREET3 Anemia work up No results for input(s): VITAMINB12, FOLATE, FERRITIN, TIBC, IRON, RETICCTPCT in the last 72 hours. Urinalysis    Component Value Date/Time   COLORURINE YELLOW 05/28/2020 1453   APPEARANCEUR CLEAR 05/28/2020 1453   APPEARANCEUR Clear 05/20/2020 1422   LABSPEC 1.009 05/28/2020 1453   PHURINE 5.0 05/28/2020 1453   GLUCOSEU NEGATIVE 05/28/2020 1453   HGBUR NEGATIVE 05/28/2020 1453   BILIRUBINUR NEGATIVE 05/28/2020 1453   BILIRUBINUR Negative 05/20/2020 1422   KETONESUR NEGATIVE 05/28/2020 1453   PROTEINUR NEGATIVE 05/28/2020 1453   UROBILINOGEN 1.0 10/31/2014 1646   NITRITE NEGATIVE 05/28/2020 1453   LEUKOCYTESUR NEGATIVE 05/28/2020 1453   Sepsis Labs Invalid input(s): PROCALCITONIN,  WBC,  LACTICIDVEN Microbiology Recent Results (from the past 240 hour(s))  Urine Culture     Status: Abnormal   Collection Time: 05/20/20 12:00 AM   Specimen: Urine, Clean Catch   Urine   Release to pati  Result Value Ref Range Status   Urine Culture, Routine Final report (A)  Final   Organism ID, Bacteria Escherichia coli (A)  Final    Comment: 50,000-100,000 colony forming units per mL Cefazolin with an MIC <=16 predicts susceptibility to the oral agents cefaclor, cefdinir, cefpodoxime, cefprozil, cefuroxime, cephalexin, and loracarbef when used for therapy of uncomplicated urinary tract infections due to E. coli, Klebsiella pneumoniae, and Proteus mirabilis.    ORGANISM ID, BACTERIA Comment  Final    Comment: Mixed urogenital flora 10,000-25,000 colony forming units per mL    Antimicrobial Susceptibility Comment  Final    Comment:       ** S = Susceptible; I = Intermediate; R = Resistant **                    P = Positive; N = Negative             MICS are expressed in micrograms per mL    Antibiotic                 RSLT#1    RSLT#2    RSLT#3    RSLT#4 Amoxicillin/Clavulanic Acid    S Ampicillin  R Cefazolin                      S Cefepime                       S Ceftriaxone                    S Cefuroxime                     S Ciprofloxacin                  S Ertapenem                      S Gentamicin                     S Imipenem                       S Levofloxacin                   S Meropenem                      S Nitrofurantoin                 S Piperacillin/Tazobactam        S Tetracycline                   S Tobramycin                     S Trimethoprim/Sulfa             S   Microscopic Examination     Status: Abnormal   Collection Time: 05/20/20  2:22 PM   Urine  Result Value Ref Range Status   WBC, UA 0-5 0 - 5 /hpf Final   RBC 3-10 (A) 0 - 2 /hpf Final   Epithelial Cells (non renal) 0-10 0 - 10 /hpf Final   Renal Epithel, UA None seen None seen /hpf Final   Bacteria, UA Few (A) None seen/Few Final  Blood Culture (routine x 2)     Status: None   Collection Time: 05/21/20  3:31 PM   Specimen: BLOOD  Result Value Ref  Range Status   Specimen Description   Final    BLOOD LEFT ANTECUBITAL Performed at [facility], [place] [address] [place], [place] [address]    Special Requests   Final    BOTTLES DRAWN AEROBIC AND ANAEROBIC Blood Culture adequate volume Performed at [facility] [address], [place], [place] [address]    Culture   Final    NO GROWTH 5 DAYS Performed at [facility], [place] [address], [place], [place] [address]    Report Status 05/26/2020 FINAL  Final  Blood Culture (routine x 2)     Status: None   Collection Time: 05/21/20  3:45 PM   Specimen: BLOOD  Result Value Ref Range Status   Specimen Description   Final    BLOOD RIGHT ANTECUBITAL Performed at [facility] [address] [place],  [address]    Special Requests   Final    BOTTLES DRAWN AEROBIC AND ANAEROBIC Blood Culture results may not be optimal due to an excessive volume of blood received in culture bottles  Performed at [facility], [place] [address], [place], [place] [address]    Culture   Final    NO GROWTH 5 DAYS Performed at [facility], [place] [address], [place], [place] [address]    Report Status 05/26/2020 FINAL  Final  Resp Panel by RT-PCR (Flu A&B, Covid) Nasopharyngeal Swab     Status: None   Collection Time: 05/21/20  5:30 PM   Specimen: Nasopharyngeal Swab; Nasopharyngeal(NP) swabs in vial transport medium  Result Value Ref Range Status   SARS Coronavirus 2 by RT PCR NEGATIVE NEGATIVE Final    Comment: (NOTE) SARS-CoV-2 target nucleic acids are NOT DETECTED.  The SARS-CoV-2 RNA is generally detectable in upper respiratory specimens during the acute phase of infection. The lowest concentration of SARS-CoV-2 viral copies this assay can detect is 138 copies/mL. A negative result does not preclude SARS-Cov-2 infection and should not be used as the sole basis for treatment or other patient management decisions. A  negative result may occur with  improper specimen collection/handling, submission of specimen other than nasopharyngeal swab, presence of viral mutation(s) within the areas targeted by this assay, and inadequate number of viral copies(<138 copies/mL). A negative result must be combined with clinical observations, patient history, and epidemiological information. The expected result is Negative.  Fact Sheet for Patients:  EntrepreneurPulse.com.au  Fact Sheet for Healthcare Providers:  IncredibleEmployment.be  This test is no t yet approved or cleared by the [place] FDA and  has been authorized for detection and/or diagnosis of SARS-CoV-2 by FDA under an Emergency Use Authorization (EUA). This EUA will remain  in effect (meaning this test can be used) for the duration of the COVID-19 declaration under Section 564(b)(1) of the Act, 21 U.S.C.section 360bbb-3(b)(1), unless the authorization is terminated  or revoked sooner.       Influenza A by PCR NEGATIVE NEGATIVE Final   Influenza B by PCR NEGATIVE NEGATIVE Final    Comment: (NOTE) The Xpert Xpress SARS-CoV-2/FLU/RSV plus assay is intended as an aid in the diagnosis of influenza from Nasopharyngeal swab specimens and should not be used as a sole basis for treatment. Nasal washings and aspirates are unacceptable for Xpert Xpress SARS-CoV-2/FLU/RSV testing.  Fact Sheet for Patients: EntrepreneurPulse.com.au  Fact Sheet for Healthcare Providers: IncredibleEmployment.be  This test is not yet approved or cleared by the [place] FDA and has been authorized for detection and/or diagnosis of SARS-CoV-2 by FDA under an Emergency Use Authorization (EUA). This EUA will remain in effect (meaning this test can be used) for the duration of the COVID-19 declaration under Section 564(b)(1) of the Act, 21 U.S.C. section 360bbb-3(b)(1), unless the authorization  is terminated or revoked.  Performed at [facility], [place] [address], [place], [place] [address]   Urine culture     Status: Abnormal   Collection Time: 05/21/20  6:18 PM   Specimen: In/Out Cath Urine  Result Value Ref Range Status   Specimen Description   Final    IN/OUT CATH URINE Performed at [facility] [address], [place], [place] [address]    Special Requests   Final    NONE Performed at [facility], [place] [address], [place], [place] [address]    Culture 4,000 COLONIES/mL ENTEROCOCCUS FAECALIS (A)  Final   Report Status 05/25/2020 FINAL  Final   Organism ID, Bacteria ENTEROCOCCUS FAECALIS (A)  Final      Susceptibility   Enterococcus faecalis - MIC*  AMPICILLIN <=2 SENSITIVE Sensitive     NITROFURANTOIN <=16 SENSITIVE Sensitive     VANCOMYCIN 1 SENSITIVE Sensitive     * 4,000 COLONIES/mL ENTEROCOCCUS FAECALIS  Urine culture     Status: None   Collection Time: 05/28/20  2:53 PM   Specimen: Urine, Clean Catch  Result Value Ref Range Status   Specimen Description   Final    URINE, CLEAN CATCH Performed at [facility], [place] [address], [place], [place] [address]    Special Requests   Final    NONE Performed at [facility], [place] [address] [place],  [address]    Culture   Final    NO GROWTH Performed at [facility], [place] [address] [address], [place],  [address]    Report Status 05/29/2020 FINAL  Final    Procedures/Studies: CT Abdomen Pelvis W Contrast  Result Date: 05/08/2020 CLINICAL DATA:  UTI, with pressure, urgency and pain EXAM: CT ABDOMEN AND PELVIS WITH CONTRAST TECHNIQUE: Multidetector CT imaging of the abdomen and pelvis was performed using the standard protocol following bolus administration of intravenous contrast. CONTRAST:  13mL OMNIPAQUE IOHEXOL 300 MG/ML  SOLN COMPARISON:  CT 03/30/2020 FINDINGS: Lower chest: Atelectatic changes in the  otherwise clear lung bases. Normal heart size. No pericardial effusion. Hepatobiliary: No worrisome focal liver lesions. Smooth liver surface contour. Normal hepatic attenuation. Normal gallbladder and biliary tree. Pancreas: Partial fatty replacement of the pancreas. No pancreatic ductal dilatation or surrounding inflammatory changes. Spleen: Normal in size. No concerning splenic lesions. Adrenals/Urinary Tract: Normal adrenal glands. Kidneys are normally located with symmetric enhancement and excretion. No suspicious renal lesion, urolithiasis or hydronephrosis. Bladder is decompressed though demonstrates fairly significant perivesicular hazy stranding, mucosal hyperemia and edematous mural thickening. No visible bladder calculi or debris. Stomach/Bowel: Small distal esophagus, stomach and duodenum are unremarkable. No small bowel thickening or dilatation. A normal appendix is visualized. No colonic dilatation or wall thickening. High attenuation material, possibly retained barium, seen within numerous colonic diverticula. No acute diverticular inflammation to suggest an active diverticulitis at this time. Slight redundancy of the sigmoid. No bowel obstruction. Vascular/Lymphatic: Atherosclerotic calcifications within the abdominal aorta and branch vessels. No aneurysm or ectasia. No enlarged abdominopelvic lymph nodes. Reproductive: Anteverted uterus.  Normal quiescent ovarian tissue. Other: No abdominopelvic free fluid or free gas. No bowel containing hernias. Musculoskeletal: Multilevel discogenic and facet degenerative changes are present throughout the spine similar to slightly progressed from comparison exam. Dextrocurvature of the lower lumbar levels, apex L4. Mild rightward pelvic tilt. Some sclerotic changes at the pubic symphysis are similar prior and may reflect sequela of prior osteitis pubis. Additional degenerative changes in the hips and SI joints. No acute fracture, traumatic osseous injury, or  suspicious lytic or blastic lesions are seen. IMPRESSION: 1. Bladder is decompressed though demonstrates significant perivesicular hazy stranding, mucosal hyperemia and edematous mural thickening. Findings are suggestive of cystitis. Correlate with urinalysis. No abnormal perinephric or periureteral stranding or other features to suggest an ascending tract infection at this time. 2. Colonic diverticulosis without evidence of acute diverticulitis. 3. Aortic Atherosclerosis (ICD10-I70.0). Electronically Signed   By: Lovena Le M.D.   On: 05/08/2020 01:29   DG Chest Port 1 View  Result Date: 05/21/2020 CLINICAL DATA:  Questionable sepsis multiple recent UTI and EXAM: PORTABLE CHEST 1 VIEW COMPARISON:  CT 04/27/2020 FINDINGS: Increased attenuation towards the lung bases likely attributable in part to body habitus. Mild diffuse interstitial opacity with vascular cephalization and congestion, cuffing, peripheral  septal lines and fissural thickening. No visible pneumothorax or effusion. The aorta is calcified. The remaining cardiomediastinal contours are unremarkable. No acute osseous or soft tissue abnormality. Degenerative changes are present in the imaged spine and shoulders. Telemetry leads overlie the chest. IMPRESSION: Findings most suggestive of interstitial pulmonary edema. No focal consolidative process. Aortic Atherosclerosis (ICD10-I70.0). Electronically Signed   By: Lovena Le M.D.   On: 05/21/2020 16:23   CT Renal Stone Study  Result Date: 05/21/2020 CLINICAL DATA:  Right flank pain. EXAM: CT ABDOMEN AND PELVIS WITHOUT CONTRAST TECHNIQUE: Multidetector CT imaging of the abdomen and pelvis was performed following the standard protocol without IV contrast. COMPARISON:  CT May 07, 2020. FINDINGS: Lower chest: No acute abnormality.  Dependent atelectasis. Hepatobiliary: No focal liver abnormality is seen. Diffuse low attenuation of the liver, suggestive of hepatic steatosis. No gallstones,  gallbladder wall thickening, or biliary dilatation. Pancreas: Similar partial fatty replacement. No pancreatic ductal dilatation or surrounding inflammatory changes. Spleen: Normal in size without focal abnormality. Adrenals/Urinary Tract: Adrenal glands are unremarkable. Kidneys are normal, without renal calculi, focal lesion, or hydronephrosis. Bilateral ureters are nondilated without evidence of radiodense calculi. Bladder is decompressed, limiting evaluation. Stomach/Bowel: Stomach is within normal limits. Appendix appears normal. No evidence of bowel wall thickening, distention, or inflammatory changes. Colonic diverticuli without evidence of acute diverticulitis. Vascular/Lymphatic: Aortic atherosclerosis. No enlarged abdominal or pelvic lymph nodes. Reproductive: Uterus and bilateral adnexa are unremarkable. Other: No free fluid or free gas. Musculoskeletal: Multilevel degenerative disc disease in the visualized spine. IMPRESSION: 1. No evidence of urinary tract calculus or hydronephrosis. No perinephric inflammatory changes. 2. Colonic diverticulosis without evidence of diverticulitis. 3. Hepatic steatosis. Electronically Signed   By: Margaretha Sheffield MD   On: 05/21/2020 16:58     Time coordinating discharge: Over 30 minutes    Dwyane Dee, MD  [facility] 05/29/2020, 3:51 PM

## 2020-06-02 NOTE — Telephone Encounter (Signed)
I am seeing her in [place] today

## 2020-06-08 ENCOUNTER — Encounter (HOSPITAL_BASED_OUTPATIENT_CLINIC_OR_DEPARTMENT_OTHER): Payer: Medicare Other | Admitting: Pulmonary Disease

## 2020-06-12 ENCOUNTER — Ambulatory Visit (INDEPENDENT_AMBULATORY_CARE_PROVIDER_SITE_OTHER): Payer: Medicare Other | Admitting: Internal Medicine

## 2020-06-12 ENCOUNTER — Encounter: Payer: Self-pay | Admitting: Internal Medicine

## 2020-06-12 ENCOUNTER — Other Ambulatory Visit: Payer: Self-pay

## 2020-06-12 VITALS — BP 153/88 | HR 58 | Temp 97.8°F | Wt 265.0 lb

## 2020-06-12 DIAGNOSIS — N3000 Acute cystitis without hematuria: Secondary | ICD-10-CM | POA: Diagnosis not present

## 2020-06-12 DIAGNOSIS — T378X5A Adverse effect of other specified systemic anti-infectives and antiparasitics, initial encounter: Secondary | ICD-10-CM

## 2020-06-12 DIAGNOSIS — T50905S Adverse effect of unspecified drugs, medicaments and biological substances, sequela: Secondary | ICD-10-CM

## 2020-06-12 NOTE — Patient Instructions (Addendum)
Thank you for seeing [place] today   At this time, the duration of 03/2020 sx might sound like UTI, but I am leaving the possibility that it could be something else  For a uti, we need Bacterial count 100,000 colonies in urine (not absolute) Symptoms consistent with uti Response to antibiotics that are shown to work based on susceptibility  Once you finish the meropnem trial of 7 days and your symptoms are persistent, I agree urology evaluation would need to be done   I do not see any clinical benefit of doing molecular testing of your urine.   picc can be removed in a couple days.Marland KitchenMarland Kitchen

## 2020-06-12 NOTE — Progress Notes (Signed)
[facility] for Infectious Disease  Patient Active Problem List   Diagnosis Date Noted  . Abdominal pain 05/28/2020  . Recurrent UTI 05/28/2020  . Severe sepsis ([place]) 05/21/2020  . Asthma   . Sleep apnea   . Severe sepsis with acute organ dysfunction ([place]) 04/25/2020  . Sepsis secondary to UTI ([place]) 04/25/2020  . Cervical disc disease   . Hepatic steatosis   . History of palpitations   . Hypercholesteremia   . Hypertension   . Lumbar disc disease   . Urinary tract infection due to extended-spectrum beta lactamase (ESBL) producing Escherichia coli   . Pyelonephritis 03/30/2020  . Ascending aortic aneurysm ([place]) 05/23/2019  . Gastrointestinal bleeding 07/25/2018  . Hiatal hernia   . Anemia 07/17/2018  . Acute blood loss anemia 07/17/2018  . Hypokalemia 07/17/2018  . Gastrointestinal hemorrhage with melena 07/16/2018  . Morbid obesity ([place]) 05/22/2018  . Overweight 03/03/2018  . 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 Chest pain with moderate risk for cardiac etiology 02/27/2018  . HTN (hypertension) 02/27/2018  . HLD (hyperlipidemia) 02/27/2018  . Hypercholesterolemia 04/07/2017  . Hypertensive disorder 04/07/2017  . Palpitations 08/19/2015      Subjective:    Patient ID: Brittany Middleton, female    DOB: 31-Mar-1951, 69 y.o.   MRN: [id number]  Chief Complaint  Patient presents with  . New Patient (Initial Visit)    HPI:  Jaunita Middleton is a 69 y.o. female referred by pcp/urology for frequent uti including one time with esbl ecoli in culture  Since 03/2020 she has had UTI sx including sepsis and a few admissions. She doesn't think the uti sx had completely resolved during this time period  Prior to that no frequent uti. In fact the last episode prior to that was 2017. She had suprapubic bladder pressure, dysuria, urgency, frequency, no fever.   Starting in 03/2020 first episode of whatever it was, she described pain suprapubic region, urgency,  frequency, but minimal urine production (maybe 3 table spoon).   She is on day 5 of meropenem as of this visit but the symptoms are still there but does endorse improvement especially the pain (spasm). The urgency/frequency also better, now every every 20 min to 30 minute (from every 5-10 minute).   She hasn't had dysuria for this October duration described above.  2 episodes she had high fever up to 103s the first 2 admission.  Previous w/u: Abd/pelv ct see below Urinalysis 05/21/2020 0-5 wbc, 0-5 rbc, rare bacteria; 11/30 0-5 wbc; 11/17 >50 wbc; 11/5 11-20 wbc; 10/10 wbc 6-10 ucx see below pvr normal  Of note, she reports the times when she had fever was all consistently after she was given nitrofurantoin. She described dry cough and sob too along with photosensitivity and n/v with the nitrofurantoin. She endorsed diffuse body ache as well with nitrofurantoin. First 2 times stopped nitrofurantoin2 days and sx would resolve. 3rd time it took about a day for sx to go away but was getting half dose nitrofurantoin only. She never took nitrofurantoin before 03/2020     Allergies  Allergen Reactions  . Ciprofloxacin Other (See Comments)    Caused PAIN IN HANDS   . Codeine Nausea And Vomiting  . Macrobid [Nitrofurantoin] Nausea Only      Outpatient Medications Prior to Visit  Medication Sig Dispense Refill  . acetaminophen (TYLENOL) 500 MG tablet Take 1,000 mg by mouth every 6 (six) hours as needed for mild pain, moderate pain,  fever or headache.    . albuterol (PROVENTIL) (2.5 MG/3ML) 0.083% nebulizer solution Take 2.5 mg by nebulization every 6 (six) hours as needed for wheezing or shortness of breath.    Marland Kitchen albuterol (VENTOLIN HFA) 108 (90 Base) MCG/ACT inhaler Inhale 2 puffs into the lungs every 6 (six) hours as needed for wheezing or shortness of breath. 8 g 2  . BIOTIN PO Take 1 tablet by mouth daily.    . Cholecalciferol (VITAMIN D-3) 25 MCG (1000 UT) CAPS Take 1,000 Units by  mouth daily.    . clopidogrel (PLAVIX) 75 MG tablet Take 1 tablet (75 mg total) by mouth daily. (Patient taking differently: Take 75 mg by mouth daily with supper.) 90 tablet 3  . Coenzyme Q10-Vitamin E (QUNOL ULTRA COQ10 PO) Take 1 capsule by mouth daily.    . fluticasone (FLONASE) 50 MCG/ACT nasal spray Place 1 spray into both nostrils daily. (Patient taking differently: Place 1 spray into both nostrils daily as needed for allergies or rhinitis.) 1 g 2  . fluticasone (FLOVENT HFA) 110 MCG/ACT inhaler Inhale 2 puffs into the lungs 2 (two) times daily. 1 each 6  . fosfomycin (MONUROL) 3 g PACK mix in 4 oz H2O    . hydrochlorothiazide (HYDRODIURIL) 25 MG tablet Take 1 tablet (25 mg total) by mouth daily. 30 tablet 2  . hydrocortisone 2.5 % cream Apply 1 application topically daily as needed (forehead).     . IRON PO Take 1 tablet by mouth 2 (two) times a week. Monday, Wednesday    . losartan (COZAAR) 50 MG tablet Take 1 tablet (50 mg total) by mouth daily. 30 tablet 2  . Melatonin 5 MG CHEW Chew 5 mg by mouth at bedtime.     . meropenem (MERREM) IVPB Inject into the vein.    . Multiple Vitamins-Minerals (MULTIVITAMIN WITH MINERALS) tablet Take 1 tablet by mouth daily.    . pantoprazole (PROTONIX) 40 MG tablet Take 1 tablet (40 mg total) by mouth daily. 30 tablet 1  . PRESCRIPTION MEDICATION Inhale into the lungs at bedtime. CPAP    . Probiotic Product (PROBIOTIC-10 PO) Take 1 capsule by mouth daily.    . phenazopyridine (PYRIDIUM) 200 MG tablet Take 200 mg by mouth every 8 (eight) hours.    . rosuvastatin (CRESTOR) 20 MG tablet Take 1 tablet (20 mg total) by mouth daily. (Patient taking differently: Take 20 mg by mouth daily after supper. ) 90 tablet 2  . TURMERIC PO Take 1 capsule by mouth daily.     No facility-administered medications prior to visit.     Past Medical History:  Diagnosis Date  . Acute blood loss anemia 07/17/2018  . Anemia 07/17/2018  . Ascending aortic aneurysm ([place])  05/23/2019  . Asthma   . Cervical disc disease    MRI 2016  . Chest pain with moderate risk for cardiac etiology 02/27/2018  . 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 Gastrointestinal bleeding 07/25/2018  . Gastrointestinal hemorrhage with melena 07/16/2018  . GERD (gastroesophageal reflux disease)   . Hepatic steatosis    per imaging 2016, 2019  . Hiatal hernia   . History of palpitations    Holter monitor, 2017: NSR, occasional PVC, PACs, no arrhythmia  . HLD (hyperlipidemia) 02/27/2018  . HTN (hypertension) 02/27/2018  . Hypercholesteremia   . Hypercholesterolemia 04/07/2017  . Hypertension   . Hypertensive disorder 04/07/2017  . Hypokalemia 07/17/2018  . Lumbar disc disease    MRI, 2017   . Morbid obesity ([place])  05/22/2018  . Overweight 03/03/2018  . Palpitations 08/19/2015  . Pyelonephritis 03/30/2020  . Sepsis secondary to UTI ([place]) 04/25/2020  . Severe sepsis with acute organ dysfunction ([place]) 04/25/2020  . Sleep apnea   . UTI (urinary tract infection)       Past Surgical History:  Procedure Laterality Date  . BLADDER REPAIR    . BREAST EXCISIONAL BIOPSY Left   . CARPAL TUNNEL RELEASE Right   . CATARACT EXTRACTION, BILATERAL  2014  . COLONOSCOPY WITH PROPOFOL N/A 07/18/2018   Procedure: COLONOSCOPY WITH PROPOFOL;  Surgeon: Laurence Spates, MD;  Location: [facility];  Service: Endoscopy;  Laterality: N/A;  . ESOPHAGOGASTRODUODENOSCOPY (EGD) WITH PROPOFOL N/A 07/17/2018   Procedure: ESOPHAGOGASTRODUODENOSCOPY (EGD) WITH PROPOFOL;  Surgeon: Laurence Spates, MD;  Location: [facility];  Service: Endoscopy;  Laterality: N/A;  . REPAIR RECTOCELE        Family History  Problem Relation Age of Onset  . CAD Mother   . Breast cancer Mother 35      Social History   Socioeconomic History  . Marital status: Divorced    Spouse name: Not on file  . Number of children: 2  . Years of education: Not on file  . Highest education level: Master's degree (e.g., MA, MS, MEng, MEd, MSW, MBA)   Occupational History  . Occupation: Retired  Tobacco Use  . Smoking status: Never Smoker  . Smokeless tobacco: Never Used  Vaping Use  . Vaping Use: Never used  Substance and Sexual Activity  . Alcohol use: No  . Drug use: No  . Sexual activity: Not on file  Other Topics Concern  . Not on file  Social History Narrative  . Not on file   Social Determinants of Health   Financial Resource Strain: Not on file  Food Insecurity: Not on file  Transportation Needs: Not on file  Physical Activity: Not on file  Stress: Not on file  Social Connections: Not on file  Intimate Partner Violence: Not on file      Review of Systems   other ROS negative    Objective:    BP (!) 153/88   Pulse (!) 58   Temp 97.8 F (36.6 C) (Oral)   Wt 265 lb (120.2 kg)   BMI 44.10 kg/m  Nursing note and vital signs reviewed.  Physical Exam Obese, no distress, conversant Heent: normocephalic; per; conj clear; eomi Neck supple cv rrr no mrg Lungs clear normal respiratory effort abd s/nt Ext no edema Skin no rash Neuro cn2-12 intact, strength/reflex symmetric, normal gait Psych alert/oriented msk no synovitis   Labs: 12/09 cr 0.7; cbc 6/12/230  Micro: 12/13 urology clinic ucx esbl ecoli 20k colonies (S ertapenem, nitrofurantoin, amikacin, piptazo, meropenem; R tobra, bactrim, gent, levo)  12/01 ucx e faecalis 4000 colonies 12/08 ucx no growth 12/01 bcx negative 11/30 ucx ecoli nonesbl 11/17 ucx ecoli esbl   Serology:  Imaging: 05/21/20 renal stone study 1. No evidence of urinary tract calculus or hydronephrosis. No  perinephric inflammatory changes. 2. Colonic diverticulosis without evidence of diverticulitis. 3. Hepatic steatosis.  05/07/20 abd ct I reviewed images independently, and there doesn't appear to be any primary kidney pathology 1. Bladder is decompressed though demonstrates significant perivesicular hazy stranding, mucosal hyperemia and edematous  mural thickening. Findings are suggestive of cystitis. Correlate with urinalysis. No abnormal perinephric or periureteral stranding or other features to suggest an ascending tract infection at this time. 2. Colonic diverticulosis without evidence of acute diverticulitis. 3. Aortic Atherosclerosis (ICD10-I70.0).  Assessment & Plan:   Patient Active Problem List   Diagnosis Date Noted  . Abdominal pain 05/28/2020  . Recurrent UTI 05/28/2020  . Severe sepsis ([place]) 05/21/2020  . Asthma   . Sleep apnea   . Severe sepsis with acute organ dysfunction ([place]) 04/25/2020  . Sepsis secondary to UTI ([place]) 04/25/2020  . Cervical disc disease   . Hepatic steatosis   . History of palpitations   . Hypercholesteremia   . Hypertension   . Lumbar disc disease   . Urinary tract infection due to extended-spectrum beta lactamase (ESBL) producing Escherichia coli   . Pyelonephritis 03/30/2020  . Ascending aortic aneurysm ([place]) 05/23/2019  . Gastrointestinal bleeding 07/25/2018  . Hiatal hernia   . Anemia 07/17/2018  . Acute blood loss anemia 07/17/2018  . Hypokalemia 07/17/2018  . Gastrointestinal hemorrhage with melena 07/16/2018  . Morbid obesity ([place]) 05/22/2018  . Overweight 03/03/2018  . 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 Chest pain with moderate risk for cardiac etiology 02/27/2018  . HTN (hypertension) 02/27/2018  . HLD (hyperlipidemia) 02/27/2018  . Hypercholesterolemia 04/07/2017  . Hypertensive disorder 04/07/2017  . Palpitations 08/19/2015     Problem List Items Addressed This Visit   None   Visit Diagnoses    Acute cystitis without hematuria    -  Primary   Adverse effect of drug, sequela         I have a 69 yo female with htn/hlp here with supraputic pain, urgency, frequency since 03/2020 along with adverse drug reaction to nitrofurantoin  #cystitis There has been esbl ecoli in her urine on 2 occasions since 03/2020 but not technically 100k colonies. She has sx of uti but  these could be primary bladder hyperactivity as well. She reveals no risk factors for somatic/visceral manifestation of being abused or psychiatric issue. She has no diabetes or spinal cord injury or primary cns organic brain syndrome to suggest a neurogenic bladder picture.   Further complicating the matter is that she was started on meropenem and pyridium at the same time with improvement of sx.   Of note, even though the ecoli was esbl, the medication used previously (ceftriaxone, augmentin) should have activities as their concentration gets very high in urine and can overcome the MIC cut off. Regardless, she is on a planned 7 days of uti. The ct does mention imaging suggestion of bladder wall inflammation as well  So at this time, this could be uti, but if after the course of meropenem and pyridium her sx returns immediately, will need to have further urologic evaluation for primary bladder issue  -finish 7 day course meropenem -if your sx returns immediately off pyridium/meropneme, can try pyridium again. If sx persistent on pyridium, could f/u with ID clinic. But if sx improves and remains so, I would advise f/u with urology   #nitrofurantoin adverse reaction, severe I agree that you likely have a complex drug reaction with nitrofurantoin given your fever, myalgia, cough, sob. I would avoid nitrofurantoin from now one   I spent 60 minutes review chart, coordinating care and more than 50% of time direct counseling and providing education for patient     Follow-up: Return if symptoms worsen or fail to improve.      Jabier Mutton, [facility] Infectious Disease [place] -- -- pager   [phone] cell 06/12/2020, 10:51 AM

## 2020-06-18 ENCOUNTER — Ambulatory Visit (HOSPITAL_BASED_OUTPATIENT_CLINIC_OR_DEPARTMENT_OTHER): Payer: Medicare Other | Attending: Pulmonary Disease | Admitting: Pulmonary Disease

## 2020-06-18 ENCOUNTER — Other Ambulatory Visit: Payer: Self-pay

## 2020-06-18 DIAGNOSIS — G4733 Obstructive sleep apnea (adult) (pediatric): Secondary | ICD-10-CM

## 2020-06-19 ENCOUNTER — Other Ambulatory Visit (HOSPITAL_BASED_OUTPATIENT_CLINIC_OR_DEPARTMENT_OTHER): Payer: Self-pay

## 2020-06-19 DIAGNOSIS — R0600 Dyspnea, unspecified: Secondary | ICD-10-CM

## 2020-06-19 DIAGNOSIS — R319 Hematuria, unspecified: Secondary | ICD-10-CM | POA: Insufficient documentation

## 2020-06-19 DIAGNOSIS — K5730 Diverticulosis of large intestine without perforation or abscess without bleeding: Secondary | ICD-10-CM

## 2020-06-19 DIAGNOSIS — G4733 Obstructive sleep apnea (adult) (pediatric): Secondary | ICD-10-CM

## 2020-06-19 DIAGNOSIS — R1013 Epigastric pain: Secondary | ICD-10-CM

## 2020-06-19 DIAGNOSIS — R739 Hyperglycemia, unspecified: Secondary | ICD-10-CM

## 2020-06-19 HISTORY — DX: Epigastric pain: R10.13

## 2020-06-19 HISTORY — DX: Hematuria, unspecified: R31.9

## 2020-06-19 HISTORY — DX: Hyperglycemia, unspecified: R73.9

## 2020-06-19 HISTORY — DX: Dyspnea, unspecified: R06.00

## 2020-06-19 HISTORY — DX: Diverticulosis of large intestine without perforation or abscess without bleeding: K57.30

## 2020-06-23 DIAGNOSIS — G4733 Obstructive sleep apnea (adult) (pediatric): Secondary | ICD-10-CM | POA: Diagnosis not present

## 2020-06-23 NOTE — Procedures (Signed)
    Patient Name: Brittany Middleton, [place] Date: 06/18/2020 Gender: Female D.O.B: 1950-07-22 Age (years): 69 Referring Provider: Freda Jackson Height (inches): 66 Interpreting Physician: Chesley Mires MD, ABSM Weight (lbs): 258 RPSGT: Zadie Rhine BMI: 43 MRN: [id number] Neck Size: 17.00  CLINICAL INFORMATION Sleep Study Type: Split Night CPAP  Indication for sleep study: Obesity, OSA  Epworth Sleepiness Score: 3  SLEEP STUDY TECHNIQUE As per the AASM Manual for the Scoring of Sleep and Associated Events v2.3 (April 2016) with a hypopnea requiring 4% desaturations.  The channels recorded and monitored were frontal, central and occipital EEG, electrooculogram (EOG), submentalis EMG (chin), nasal and oral airflow, thoracic and abdominal wall motion, anterior tibialis EMG, snore microphone, electrocardiogram, and pulse oximetry. Continuous positive airway pressure (CPAP) was initiated when the patient met split night criteria and was titrated according to treat sleep-disordered breathing.  MEDICATIONS Medications self-administered by patient taken the night of the study : MELATONIN, TYLENOL PM  RESPIRATORY PARAMETERS Diagnostic  Total AHI (/hr): 29.9 RDI (/hr): 69.7 OA Index (/hr): 0.5 CA Index (/hr): 0.0 REM AHI (/hr): 40.0 NREM AHI (/hr): 29.8 Supine AHI (/hr): 29.9 Non-supine AHI (/hr): 0 Min O2 Sat (%): 85.0 Mean O2 (%): 90.3 Time below 88% (min): 17.1   Titration  Optimal Pressure (cm): 8 AHI at Optimal Pressure (/hr): 0.0 Min O2 at Optimal Pressure (%): 88.0 Supine % at Optimal (%): 100 Sleep % at Optimal (%): 90   SLEEP ARCHITECTURE The recording time for the entire night was 371.8 minutes.  During a baseline period of 178.3 minutes, the patient slept for 126.5 minutes in REM and nonREM, yielding a sleep efficiency of 71.0%%. Sleep onset after lights out was 13.7 minutes with a REM latency of 111.0 minutes. The patient spent 10.3%% of the night in stage N1 sleep,  88.5%% in stage N2 sleep, 0.0%% in stage N3 and 1.2% in REM.  During the titration period of 186.9 minutes, the patient slept for 147.5 minutes in REM and nonREM, yielding a sleep efficiency of 78.9%%. Sleep onset after CPAP initiation was 4.4 minutes with a REM latency of 23.5 minutes. The patient spent 8.1%% of the night in stage N1 sleep, 73.9%% in stage N2 sleep, 0.0%% in stage N3 and 18% in REM.  CARDIAC DATA The 2 lead EKG demonstrated sinus rhythm. The mean heart rate was 100.0 beats per minute. Other EKG findings include: None.  LEG MOVEMENT DATA The total Periodic Limb Movements of Sleep (PLMS) were 0. The PLMS index was 0.0 .  IMPRESSIONS - Moderate to severe obstructive sleep apnea with an AHI of 29.9 and SpO2 low of 85%. - She did well with CPAP at 8 cm H2O.  - She did not require supplemental oxygen during this study.  DIAGNOSIS - Obstructive Sleep Apnea (G47.33)  RECOMMENDATIONS - Trial of CPAP therapy on 8 cm H2O with a Medium size Philips Respironics Nasal Pillow Mask Nuance Pro Gel mask and heated humidification. - Avoid alcohol, sedatives and other CNS depressants that may worsen sleep apnea and disrupt normal sleep architecture. - Sleep hygiene should be reviewed to assess factors that may improve sleep quality. - Weight management and regular exercise should be initiated or continued.  [Electronically signed] 06/23/2020 10:16 AM  Chesley Mires MD, ABSM Diplomate, American Board of Sleep Medicine   NPI: [id number]

## 2020-07-14 ENCOUNTER — Encounter: Payer: Self-pay | Admitting: Pulmonary Disease

## 2020-07-14 ENCOUNTER — Ambulatory Visit (INDEPENDENT_AMBULATORY_CARE_PROVIDER_SITE_OTHER): Payer: Medicare Other | Admitting: Pulmonary Disease

## 2020-07-14 ENCOUNTER — Other Ambulatory Visit: Payer: Self-pay

## 2020-07-14 VITALS — BP 126/68 | HR 59 | Temp 98.5°F | Ht 65.0 in | Wt 266.2 lb

## 2020-07-14 DIAGNOSIS — G4733 Obstructive sleep apnea (adult) (pediatric): Secondary | ICD-10-CM

## 2020-07-14 DIAGNOSIS — J4520 Mild intermittent asthma, uncomplicated: Secondary | ICD-10-CM

## 2020-07-14 DIAGNOSIS — K219 Gastro-esophageal reflux disease without esophagitis: Secondary | ICD-10-CM | POA: Diagnosis not present

## 2020-07-14 NOTE — Patient Instructions (Signed)
Continue on pantoprazole for GERD and continue to elevate the head of the bed when sleeping at night  Continue CPAP therapy nightly, will check with Dr. Halford Chessman about trying to get you a new machine.  Use flonase nasal spray as needed for congestion and sinus drainage.  Can use Ayr saline gel or saline nasal spray at night to avoid nose bleeding.  Continue as needed use of flovent up to 4 puffs per day.
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
## 2020-07-15 ENCOUNTER — Ambulatory Visit: Payer: Medicare Other | Admitting: Urology

## 2020-08-18 ENCOUNTER — Other Ambulatory Visit: Payer: Self-pay | Admitting: Internal Medicine

## 2020-08-18 DIAGNOSIS — Z1231 Encounter for screening mammogram for malignant neoplasm of breast: Secondary | ICD-10-CM

## 2020-10-15 ENCOUNTER — Other Ambulatory Visit: Payer: Self-pay

## 2020-10-15 ENCOUNTER — Ambulatory Visit
Admission: RE | Admit: 2020-10-15 | Discharge: 2020-10-15 | Disposition: A | Discharge status: Home / Self Care | Payer: Medicare Other | Source: Ambulatory Visit | Attending: Internal Medicine | Admitting: Internal Medicine

## 2020-10-15 DIAGNOSIS — Z1231 Encounter for screening mammogram for malignant neoplasm of breast: Secondary | ICD-10-CM

## 2020-10-16 ENCOUNTER — Other Ambulatory Visit: Payer: Self-pay | Admitting: Internal Medicine

## 2020-10-16 DIAGNOSIS — R928 Other abnormal and inconclusive findings on diagnostic imaging of breast: Secondary | ICD-10-CM

## 2020-10-29 ENCOUNTER — Other Ambulatory Visit: Payer: Self-pay | Admitting: Cardiology

## 2020-11-10 ENCOUNTER — Other Ambulatory Visit: Payer: Self-pay | Admitting: Registered Nurse

## 2020-11-11 ENCOUNTER — Other Ambulatory Visit: Payer: Self-pay | Admitting: Internal Medicine

## 2020-11-11 ENCOUNTER — Ambulatory Visit
Admission: RE | Admit: 2020-11-11 | Discharge: 2020-11-11 | Disposition: A | Discharge status: Home / Self Care | Payer: Medicare Other | Source: Ambulatory Visit | Attending: Internal Medicine | Admitting: Internal Medicine

## 2020-11-11 ENCOUNTER — Other Ambulatory Visit: Payer: Self-pay | Admitting: Family Medicine

## 2020-11-11 ENCOUNTER — Other Ambulatory Visit: Payer: Self-pay

## 2020-11-11 DIAGNOSIS — R928 Other abnormal and inconclusive findings on diagnostic imaging of breast: Secondary | ICD-10-CM

## 2020-12-10 DIAGNOSIS — H5022 Vertical strabismus, left eye: Secondary | ICD-10-CM | POA: Insufficient documentation

## 2020-12-10 HISTORY — DX: Vertical strabismus, left eye: H50.22
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
## 2020-12-17 ENCOUNTER — Telehealth: Payer: Self-pay | Admitting: Pulmonary Disease

## 2020-12-17 DIAGNOSIS — G4733 Obstructive sleep apnea (adult) (pediatric): Secondary | ICD-10-CM

## 2020-12-17 NOTE — Telephone Encounter (Signed)
Called and spoke with patient. She is aware that I have placed the order with [employer]. She verbalized understanding.   Nothing further needed at time of call.

## 2020-12-28 ENCOUNTER — Other Ambulatory Visit: Payer: Self-pay

## 2020-12-28 ENCOUNTER — Emergency Department (HOSPITAL_COMMUNITY): Payer: Medicare Other

## 2020-12-28 ENCOUNTER — Encounter (HOSPITAL_COMMUNITY): Payer: Self-pay | Admitting: Emergency Medicine

## 2020-12-28 ENCOUNTER — Inpatient Hospital Stay (HOSPITAL_COMMUNITY)
Admission: EM | Admit: 2020-12-28 | Discharge: 2021-01-01 | DRG: 357 | Disposition: A | Discharge status: Home / Self Care | Payer: Medicare Other | Attending: Family Medicine | Admitting: Family Medicine

## 2020-12-28 DIAGNOSIS — C786 Secondary malignant neoplasm of retroperitoneum and peritoneum: Secondary | ICD-10-CM

## 2020-12-28 DIAGNOSIS — I10 Essential (primary) hypertension: Secondary | ICD-10-CM | POA: Diagnosis present

## 2020-12-28 DIAGNOSIS — M519 Unspecified thoracic, thoracolumbar and lumbosacral intervertebral disc disorder: Secondary | ICD-10-CM | POA: Diagnosis present

## 2020-12-28 DIAGNOSIS — Z881 Allergy status to other antibiotic agents status: Secondary | ICD-10-CM | POA: Diagnosis not present

## 2020-12-28 DIAGNOSIS — E876 Hypokalemia: Secondary | ICD-10-CM | POA: Diagnosis present

## 2020-12-28 DIAGNOSIS — Z9842 Cataract extraction status, left eye: Secondary | ICD-10-CM

## 2020-12-28 DIAGNOSIS — Z79899 Other long term (current) drug therapy: Secondary | ICD-10-CM

## 2020-12-28 DIAGNOSIS — K56609 Unspecified intestinal obstruction, unspecified as to partial versus complete obstruction: Secondary | ICD-10-CM | POA: Diagnosis present

## 2020-12-28 DIAGNOSIS — J45909 Unspecified asthma, uncomplicated: Secondary | ICD-10-CM | POA: Diagnosis present

## 2020-12-28 DIAGNOSIS — M5090 Cervical disc disorder, unspecified, unspecified cervical region: Secondary | ICD-10-CM | POA: Diagnosis present

## 2020-12-28 DIAGNOSIS — R6881 Early satiety: Secondary | ICD-10-CM | POA: Diagnosis present

## 2020-12-28 DIAGNOSIS — R194 Change in bowel habit: Secondary | ICD-10-CM | POA: Diagnosis not present

## 2020-12-28 DIAGNOSIS — Z885 Allergy status to narcotic agent status: Secondary | ICD-10-CM | POA: Diagnosis not present

## 2020-12-28 DIAGNOSIS — N3010 Interstitial cystitis (chronic) without hematuria: Secondary | ICD-10-CM | POA: Diagnosis present

## 2020-12-28 DIAGNOSIS — G4730 Sleep apnea, unspecified: Secondary | ICD-10-CM | POA: Diagnosis present

## 2020-12-28 DIAGNOSIS — Z888 Allergy status to other drugs, medicaments and biological substances status: Secondary | ICD-10-CM

## 2020-12-28 DIAGNOSIS — R140 Abdominal distension (gaseous): Secondary | ICD-10-CM | POA: Diagnosis not present

## 2020-12-28 DIAGNOSIS — K219 Gastro-esophageal reflux disease without esophagitis: Secondary | ICD-10-CM | POA: Diagnosis present

## 2020-12-28 DIAGNOSIS — E7800 Pure hypercholesterolemia, unspecified: Secondary | ICD-10-CM | POA: Diagnosis present

## 2020-12-28 DIAGNOSIS — Z7902 Long term (current) use of antithrombotics/antiplatelets: Secondary | ICD-10-CM | POA: Diagnosis not present

## 2020-12-28 DIAGNOSIS — C800 Disseminated malignant neoplasm, unspecified: Secondary | ICD-10-CM

## 2020-12-28 DIAGNOSIS — Z87440 Personal history of urinary (tract) infections: Secondary | ICD-10-CM | POA: Diagnosis not present

## 2020-12-28 DIAGNOSIS — Z20822 Contact with and (suspected) exposure to covid-19: Secondary | ICD-10-CM | POA: Diagnosis present

## 2020-12-28 DIAGNOSIS — I712 Thoracic aortic aneurysm, without rupture: Secondary | ICD-10-CM | POA: Diagnosis present

## 2020-12-28 DIAGNOSIS — Z9841 Cataract extraction status, right eye: Secondary | ICD-10-CM | POA: Diagnosis not present

## 2020-12-28 DIAGNOSIS — C762 Malignant neoplasm of abdomen: Secondary | ICD-10-CM | POA: Diagnosis not present

## 2020-12-28 DIAGNOSIS — K56600 Partial intestinal obstruction, unspecified as to cause: Secondary | ICD-10-CM | POA: Diagnosis present

## 2020-12-28 DIAGNOSIS — Z6841 Body Mass Index (BMI) 40.0 and over, adult: Secondary | ICD-10-CM | POA: Diagnosis not present

## 2020-12-28 DIAGNOSIS — Z8041 Family history of malignant neoplasm of ovary: Secondary | ICD-10-CM

## 2020-12-28 DIAGNOSIS — C801 Malignant (primary) neoplasm, unspecified: Secondary | ICD-10-CM | POA: Diagnosis not present

## 2020-12-28 HISTORY — DX: Unspecified intestinal obstruction, unspecified as to partial versus complete obstruction: K56.609

## 2020-12-28 LAB — URINALYSIS, ROUTINE W REFLEX MICROSCOPIC
Glucose, UA: NEGATIVE mg/dL
Ketones, ur: NEGATIVE mg/dL
Nitrite: NEGATIVE
Protein, ur: 100 mg/dL — AB
Specific Gravity, Urine: 1.025 (ref 1.005–1.030)
WBC, UA: >50 WBC/hpf — ABNORMAL HIGH (ref 0–5)
pH: 6 (ref 5.0–8.0)

## 2020-12-28 LAB — CBC WITH DIFFERENTIAL/PLATELET
Abs Immature Granulocytes: 0.02 10*3/uL (ref 0.00–0.07)
Basophils Absolute: 0 10*3/uL (ref 0.0–0.1)
Basophils Relative: 0 %
Eosinophils Absolute: 0.1 10*3/uL (ref 0.0–0.5)
Eosinophils Relative: 1 %
HCT: 44.1 % (ref 36.0–46.0)
Hemoglobin: 14.2 g/dL (ref 12.0–15.0)
Immature Granulocytes: 0 %
Lymphocytes Relative: 14 %
Lymphs Abs: 0.8 10*3/uL (ref 0.7–4.0)
MCH: 28.4 pg (ref 26.0–34.0)
MCHC: 32.2 g/dL (ref 30.0–36.0)
MCV: 88.2 fL (ref 80.0–100.0)
Monocytes Absolute: 0.7 10*3/uL (ref 0.1–1.0)
Monocytes Relative: 12 %
Neutro Abs: 3.9 10*3/uL (ref 1.7–7.7)
Neutrophils Relative %: 73 %
Platelets: 274 10*3/uL (ref 150–400)
RBC: 5 MIL/uL (ref 3.87–5.11)
RDW: 14.2 % (ref 11.5–15.5)
WBC: 5.5 10*3/uL (ref 4.0–10.5)
nRBC: 0 % (ref 0.0–0.2)

## 2020-12-28 LAB — COMPREHENSIVE METABOLIC PANEL
ALT: 20 U/L (ref 0–44)
AST: 24 U/L (ref 15–41)
Albumin: 4.3 g/dL (ref 3.5–5.0)
Alkaline Phosphatase: 55 U/L (ref 38–126)
Anion gap: 8 (ref 5–15)
BUN: 8 mg/dL (ref 8–23)
CO2: 30 mmol/L (ref 22–32)
Calcium: 9.1 mg/dL (ref 8.9–10.3)
Chloride: 100 mmol/L (ref 98–111)
Creatinine, Ser: 0.67 mg/dL (ref 0.44–1.00)
GFR, Estimated: >60 mL/min (ref >60)
Glucose, Bld: 115 mg/dL — ABNORMAL HIGH (ref 70–99)
Potassium: 3.4 mmol/L — ABNORMAL LOW (ref 3.5–5.1)
Sodium: 138 mmol/L (ref 135–145)
Total Bilirubin: 1.1 mg/dL (ref 0.3–1.2)
Total Protein: 7.6 g/dL (ref 6.5–8.1)

## 2020-12-28 LAB — LACTIC ACID, PLASMA: Lactic Acid, Venous: 1.1 mmol/L (ref 0.5–1.9)

## 2020-12-28 LAB — MAGNESIUM: Magnesium: 2 mg/dL (ref 1.7–2.4)

## 2020-12-28 LAB — LIPASE, BLOOD: Lipase: 30 U/L (ref 11–51)

## 2020-12-28 LAB — RESP PANEL BY RT-PCR (FLU A&B, COVID) ARPGX2
Influenza A by PCR: NEGATIVE
Influenza B by PCR: NEGATIVE
SARS Coronavirus 2 by RT PCR: NEGATIVE

## 2020-12-28 MED ORDER — HYDRALAZINE HCL 20 MG/ML IJ SOLN: 10.0000 mg | Freq: Three times a day (TID) | INTRAMUSCULAR | Status: DC | PRN

## 2020-12-28 MED ORDER — BUDESONIDE 0.25 MG/2ML IN SUSP
0.2500 mg | Freq: Two times a day (BID) | RESPIRATORY_TRACT | Status: DC
  Filled 2020-12-28 (×2): qty 2

## 2020-12-28 MED ORDER — ONDANSETRON HCL 4 MG/2ML IJ SOLN
4.0000 mg | Freq: Once | INTRAMUSCULAR | Status: AC
  Administered 2020-12-28: 4 mg via INTRAVENOUS
  Filled 2020-12-28: qty 2

## 2020-12-28 MED ORDER — FLUTICASONE PROPIONATE 50 MCG/ACT NA SUSP
1.0000 | Freq: Every day | NASAL | Status: DC | PRN
  Filled 2020-12-28: qty 16

## 2020-12-28 MED ORDER — ALBUTEROL SULFATE (2.5 MG/3ML) 0.083% IN NEBU: 2.5000 mg | INHALATION_SOLUTION | Freq: Four times a day (QID) | RESPIRATORY_TRACT | Status: DC | PRN

## 2020-12-28 MED ORDER — MORPHINE SULFATE (PF) 4 MG/ML IV SOLN
4.0000 mg | Freq: Once | INTRAVENOUS | Status: AC
  Administered 2020-12-28: 4 mg via INTRAVENOUS
  Filled 2020-12-28: qty 1

## 2020-12-28 MED ORDER — ACETAMINOPHEN 325 MG PO TABS
650.0000 mg | ORAL_TABLET | Freq: Four times a day (QID) | ORAL | Status: DC | PRN
  Administered 2020-12-31 – 2021-01-01 (×5): 650 mg via ORAL
  Filled 2020-12-28 (×5): qty 2

## 2020-12-28 MED ORDER — ONDANSETRON HCL 4 MG/2ML IJ SOLN
4.0000 mg | Freq: Four times a day (QID) | INTRAMUSCULAR | Status: DC | PRN
  Administered 2020-12-30: 4 mg via INTRAVENOUS
  Filled 2020-12-28: qty 2

## 2020-12-28 MED ORDER — IOHEXOL 300 MG/ML  SOLN
60.0000 mL | Freq: Once | INTRAMUSCULAR | Status: AC | PRN
  Administered 2020-12-28: 60 mL via INTRAVENOUS

## 2020-12-28 MED ORDER — ACETAMINOPHEN 650 MG RE SUPP: 650.0000 mg | Freq: Four times a day (QID) | RECTAL | Status: DC | PRN

## 2020-12-28 MED ORDER — MORPHINE SULFATE (PF) 2 MG/ML IV SOLN
2.0000 mg | INTRAVENOUS | Status: DC | PRN
  Administered 2020-12-31: 2 mg via INTRAVENOUS
  Filled 2020-12-28: qty 1

## 2020-12-28 MED ORDER — PANTOPRAZOLE SODIUM 40 MG IV SOLR
40.0000 mg | INTRAVENOUS | Status: DC
  Administered 2020-12-28 – 2020-12-31 (×4): 40 mg via INTRAVENOUS
  Filled 2020-12-28 (×4): qty 40

## 2020-12-28 MED ORDER — DICYCLOMINE HCL 10 MG/ML IM SOLN
20.0000 mg | Freq: Once | INTRAMUSCULAR | Status: AC
  Administered 2020-12-28: 20 mg via INTRAMUSCULAR
  Filled 2020-12-28: qty 2

## 2020-12-28 MED ORDER — ALBUTEROL SULFATE HFA 108 (90 BASE) MCG/ACT IN AERS: 2.0000 | INHALATION_SPRAY | Freq: Four times a day (QID) | RESPIRATORY_TRACT | Status: DC | PRN

## 2020-12-28 MED ORDER — SODIUM CHLORIDE 0.9 % IV SOLN: Freq: Once | INTRAVENOUS | Status: AC

## 2020-12-28 MED ORDER — ENOXAPARIN SODIUM 60 MG/0.6ML IJ SOSY
60.0000 mg | PREFILLED_SYRINGE | INTRAMUSCULAR | Status: DC
  Administered 2020-12-28 – 2020-12-29 (×2): 60 mg via SUBCUTANEOUS
  Filled 2020-12-28 (×2): qty 0.6

## 2020-12-28 MED ORDER — POTASSIUM CHLORIDE 10 MEQ/100ML IV SOLN: 10.0000 meq | Freq: Once | INTRAVENOUS | Status: DC

## 2020-12-28 MED ORDER — DICYCLOMINE HCL 10 MG/ML IM SOLN
10.0000 mg | Freq: Four times a day (QID) | INTRAMUSCULAR | Status: DC | PRN
  Administered 2020-12-28 – 2020-12-30 (×5): 10 mg via INTRAMUSCULAR
  Filled 2020-12-28 (×11): qty 1

## 2020-12-28 MED ORDER — ONDANSETRON HCL 4 MG PO TABS: 4.0000 mg | ORAL_TABLET | Freq: Four times a day (QID) | ORAL | Status: DC | PRN

## 2020-12-28 MED ORDER — POTASSIUM CHLORIDE 10 MEQ/100ML IV SOLN
10.0000 meq | INTRAVENOUS | Status: AC
  Administered 2020-12-28 (×4): 10 meq via INTRAVENOUS
  Filled 2020-12-28 (×4): qty 100

## 2020-12-28 NOTE — Progress Notes (Signed)
Michaelle Birks, MD has also been made aware of the patient's refusal of the NG tube.

## 2020-12-28 NOTE — Consult Note (Signed)
Brittany Middleton 26-Feb-1951  [id number].    Chief Complaint/Reason for Consult: small bowel obstruction with carcinomatosis  HPI:  Ms. Posey is a 70 yo female who presented to the ED today with abdominal discomfort and bloating. She has been having early satiety with poor appetite and bloating for the last few weeks. She has made several changes to her diet in the last few months and has been avoiding dairy, and since then has lost about 20 pounds. In the last week she has also passed very minimal flatus and has been very constipated. She had one soft bowel movement yesterday after using a glycerin suppository, and prior to that it had been a week since her last bowel movement.  Her bloating became much worse last night and persisted today, prompting her to come to the ED. She reports occasional nausea but no vomiting. Labs in the ED are unremarkable and WBC is normal. A CT scan shows diffuse omental thickening with small volume free fluid and some mildly dilated loops of small bowel, consistent with carcinomatosis. These findings are new compared to a prior scan in December 2021. No obvious primary malignancy was visualized on the CT scan. 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  Her last colonoscopy was in January 2020 and was normal. She has not had any prior abdominal surgeries. Her mother had breast cancer, her father had lung cancer, one of her paternal aunts had ovarian cancer and she believes another one of her paternal aunts had cervical cancer. She is on plavix for a coronary artery aneurysm and her last dose was yesterday 7/9.  ROS: Review of Systems  Constitutional:  Positive for weight loss. Negative for chills and fever.  Respiratory:  Negative for shortness of breath and wheezing.   Cardiovascular:  Negative for chest pain.  Gastrointestinal:  Positive for constipation and heartburn. Negative for blood in stool and vomiting.   Genitourinary:        H/o chronic interstitial cystitis  Neurological:  Negative for dizziness and weakness.   Family History  Problem Relation Age of Onset   CAD Mother    Breast cancer Mother 26    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   Gastrointestinal bleeding 07/25/2018   Gastrointestinal hemorrhage with melena 07/16/2018   GERD (gastroesophageal reflux disease)    Hepatic steatosis    per imaging 2016, 2019   Hiatal hernia    History of palpitations    Holter monitor, 2017: NSR, occasional PVC, PACs, no arrhythmia   HLD (hyperlipidemia) 02/27/2018   HTN (hypertension) 02/27/2018   Hypercholesteremia    Hypercholesterolemia 04/07/2017   Hypertension    Hypertensive disorder 04/07/2017   Hypokalemia 07/17/2018   Lumbar disc disease    MRI, 2017    Morbid obesity ([place]) 05/22/2018   Overweight 03/03/2018   Palpitations 08/19/2015   Pyelonephritis 03/30/2020   Sepsis secondary to UTI ([place]) 04/25/2020   Severe sepsis with acute organ dysfunction ([place]) 04/25/2020   Sleep apnea    UTI (urinary tract infection)     Past Surgical History:  Procedure Laterality Date   BLADDER REPAIR     BREAST EXCISIONAL BIOPSY Left  CARPAL TUNNEL RELEASE Right    CATARACT EXTRACTION, BILATERAL  2014   COLONOSCOPY WITH PROPOFOL N/A 07/18/2018   Procedure: COLONOSCOPY WITH PROPOFOL;  Surgeon: Laurence Spates, MD;  Location: [place];  Service: Endoscopy;  Laterality: N/A;   ESOPHAGOGASTRODUODENOSCOPY (EGD) WITH PROPOFOL N/A 07/17/2018   Procedure: ESOPHAGOGASTRODUODENOSCOPY (EGD) WITH PROPOFOL;  Surgeon: Laurence Spates, MD;  Location: [place];  Service: Endoscopy;  Laterality: N/A;   REPAIR RECTOCELE      Social History:  reports that she has never  smoked. She has never used smokeless tobacco. She reports that she does not drink alcohol and does not use drugs.  Allergies:  Allergies  Allergen Reactions   Macrobid [Nitrofurantoin] Shortness Of Breath    Short of breath, cough, myalgia   Ciprofloxacin Other (See Comments)    Caused PAIN IN HANDS    Codeine Nausea And Vomiting    (Not in a hospital admission)    Physical Exam: Blood pressure (!) 164/67, pulse (!) 55, temperature 97.8 F (36.6 C), temperature source Oral, resp. rate (!) 22, height 5\' 5"  (1.651 m), weight 117.9 kg, SpO2 100 %. General: resting comfortably, appears stated age, no apparent distress Neurological: alert and oriented, no focal deficits, cranial nerves grossly in tact HEENT: normocephalic, atraumatic, oropharynx clear, no scleral icterus CV: regular rate and rhythm, no murmurs, extremities warm and well-perfused Respiratory: normal work of breathing, lungs clear to auscultation bilaterally, symmetric chest wall expansion Abdomen: soft, nondistended, focally tender in the RLQ. No masses or organomegaly.No surgical scars. Extremities: warm and well-perfused, no deformities, moving all extremities spontaneously Psychiatric: normal mood and affect Skin: warm and dry, no jaundice, no rashes or lesions   Results for orders placed or performed during the hospital encounter of 12/28/20 (from the past 48 hour(s))  CBC with Differential/Platelet     Status: None   Collection Time: 12/28/20 10:57 AM  Result Value Ref Range   WBC 5.5 4.0 - 10.5 K/uL   RBC 5.00 3.87 - 5.11 MIL/uL   Hemoglobin 14.2 12.0 - 15.0 g/dL   HCT 44.1 36.0 - 46.0 %   MCV 88.2 80.0 - 100.0 fL   MCH 28.4 26.0 - 34.0 pg   MCHC 32.2 30.0 - 36.0 g/dL   RDW 14.2 11.5 - 15.5 %   Platelets 274 150 - 400 K/uL   nRBC 0.0 0.0 - 0.2 %   Neutrophils Relative % 73 %   Neutro Abs 3.9 1.7 - 7.7 K/uL   Lymphocytes Relative 14 %   Lymphs Abs 0.8 0.7 - 4.0 K/uL   Monocytes Relative 12 %    Monocytes Absolute 0.7 0.1 - 1.0 K/uL   Eosinophils Relative 1 %   Eosinophils Absolute 0.1 0.0 - 0.5 K/uL   Basophils Relative 0 %   Basophils Absolute 0.0 0.0 - 0.1 K/uL   Immature Granulocytes 0 %   Abs Immature Granulocytes 0.02 0.00 - 0.07 K/uL    Comment: Performed at [facility], [place] [address] [place], [place] [address]  Comprehensive metabolic panel     Status: Abnormal   Collection Time: 12/28/20 10:57 AM  Result Value Ref Range   Sodium 138 135 - 145 mmol/L   Potassium 3.4 (L) 3.5 - 5.1 mmol/L   Chloride 100 98 - 111 mmol/L   CO2 30 22 - 32 mmol/L   Glucose, Bld 115 (H) 70 - 99 mg/dL    Comment: Glucose reference range applies only to samples taken after fasting for at least 8  hours.   BUN 8 8 - 23 mg/dL   Creatinine, Ser 0.67 0.44 - 1.00 mg/dL   Calcium 9.1 8.9 - 10.3 mg/dL   Total Protein 7.6 6.5 - 8.1 g/dL   Albumin 4.3 3.5 - 5.0 g/dL   AST 24 15 - 41 U/L   ALT 20 0 - 44 U/L   Alkaline Phosphatase 55 38 - 126 U/L   Total Bilirubin 1.1 0.3 - 1.2 mg/dL   GFR, Estimated >60 >60 mL/min    Comment: (NOTE) Calculated using the CKD-EPI Creatinine Equation (2021)    Anion gap 8 5 - 15    Comment: Performed at [facility], [place] [address], [place], [place] [address]  Lipase, blood     Status: None   Collection Time: 12/28/20 10:57 AM  Result Value Ref Range   Lipase 30 11 - 51 U/L    Comment: Performed at [facility], [place] [address] [place], [place] [address]  Urinalysis, Routine w reflex microscopic Urine, Clean Catch     Status: Abnormal   Collection Time: 12/28/20 10:57 AM  Result Value Ref Range   Color, Urine AMBER (A) YELLOW    Comment: BIOCHEMICALS MAY BE AFFECTED BY COLOR   APPearance CLOUDY (A) CLEAR   Specific Gravity, Urine 1.025 1.005 - 1.030   pH 6.0 5.0 - 8.0   Glucose, UA NEGATIVE NEGATIVE mg/dL   Hgb urine dipstick SMALL (A) NEGATIVE   Bilirubin Urine SMALL (A) NEGATIVE   Ketones, ur  NEGATIVE NEGATIVE mg/dL   Protein, ur 100 (A) NEGATIVE mg/dL   Nitrite NEGATIVE NEGATIVE   Leukocytes,Ua MODERATE (A) NEGATIVE   RBC / HPF 21-50 0 - 5 RBC/hpf   WBC, UA >50 (H) 0 - 5 WBC/hpf   Bacteria, UA MANY (A) NONE SEEN   Squamous Epithelial / LPF 11-20 0 - 5   Mucus PRESENT    Hyaline Casts, UA PRESENT    Non Squamous Epithelial 0-5 (A) NONE SEEN    Comment: Performed at [facility], [place] [address] [place], [place] [address]  Lactic acid, plasma     Status: None   Collection Time: 12/28/20 10:57 AM  Result Value Ref Range   Lactic Acid, Venous 1.1 0.5 - 1.9 mmol/L    Comment: Performed at [facility], [place] [address] [place], [place] [address]   CT CHEST WO CONTRAST  Result Date: 12/28/2020 CLINICAL DATA:  Acute abdominal pain.  Shortness of breath. EXAM: CT CHEST, ABDOMEN, AND PELVIS WITH CONTRAST TECHNIQUE: Multidetector CT imaging of the chest, abdomen and pelvis was performed following the standard protocol during bolus administration of intravenous contrast. CONTRAST:  90mL OMNIPAQUE IOHEXOL 300 MG/ML  SOLN COMPARISON:  Chest CT dated 04/27/2020. CT abdomen dated 05/21/2020. FINDINGS: CT CHEST FINDINGS Cardiovascular: No thoracic aortic aneurysm. No pericardial effusion. Scattered coronary artery calcifications. Mild aortic atherosclerosis. 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 Mediastinum/Nodes: No mass or enlarged lymph nodes within the mediastinum or perihilar regions. Esophagus appears normal. Trachea and central bronchi are unremarkable. Lungs/Pleura: Small chronic pleural effusions with associated atelectasis. Lungs otherwise clear. No pneumothorax. Musculoskeletal: Mild degenerative spondylosis of the kyphotic thoracic spine. No acute-appearing osseous abnormality. CT ABDOMEN PELVIS FINDINGS Hepatobiliary: No focal liver abnormality is seen. Gallbladder is distended but otherwise  unremarkable. No bile duct dilatation is seen. Pancreas: Unremarkable. No pancreatic ductal dilatation or surrounding inflammatory changes. Spleen: Normal in size without focal abnormality. Adrenals/Urinary Tract: Adrenal  glands appear normal. Kidneys are unremarkable without suspicious mass, stone or hydronephrosis. No obstructing ureteral stone. Bladder is unremarkable, partially decompressed. Stomach/Bowel: No dilated large or small bowel loops. Extensive diverticulosis of the transverse, descending and sigmoid colon but no focal inflammatory change to suggest acute diverticulitis. Mildly distended small bowel loops throughout the abdomen and pelvis, with fluid and associated air-fluid levels throughout. Stomach is unremarkable, partially decompressed. Vascular/Lymphatic: No acute-appearing vascular abnormality. Mild aortic atherosclerosis. No enlarged lymph nodes are seen in the abdomen or pelvis. Reproductive: Uterus and bilateral adnexa are unremarkable. Other: Fluid stranding and nodular thickening of the anterior mesentery and central mesentery, most suggestive of omental caking suggesting neoplastic process (peritoneal carcinomatosis. Small amount of free fluid adjacent to the liver and spleen. No circumscribed fluid collection or abscess-like collection is identified. Musculoskeletal: Degenerative spondylosis of the lumbar spine, moderate in degree. No acute or suspicious osseous abnormality. (Lucent lesion within the medial aspect of the LEFT iliac bone, of uncertain significance, possibly a small benign bone island or degenerative subchondral cyst. No additional similar-appearing lesions within the axial skeleton or osseous pelvis. IMPRESSION: 1. 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 Differential for peritoneal carcinomatosis is primarily  neoplastic and includes cancers of the ovary, appendix, colon, pancreas and stomach. PET-CT may be helpful for further characterization. Tissue sampling of the mesentery may be eventually required for diagnosis. 2. Mildly distended small bowel loops throughout the abdomen and pelvis, with fluid and associated air-fluid levels throughout the nondistended small bowel, suggesting a mild ileus versus partial small bowel obstruction. Favor partial small bowel obstruction with transition zone in the RIGHT lower quadrant, likely related to aforementioned neoplastic peritoneal implants and/or adhesions. 3. Small free fluid within the RIGHT upper quadrant and LEFT upper quadrant. No abscess collection seen. No free intraperitoneal air seen. 4. Extensive colonic diverticulosis without evidence of acute diverticulitis. 5. Small chronic pleural effusions with associated atelectasis. Aortic Atherosclerosis (ICD10-I70.0). These results were called by telephone at the time of interpretation on 12/28/2020 at 12:27 pm to provider [facility] , who verbally acknowledged these results. Electronically Signed   By: Franki Cabot M.D.   On: 12/28/2020 12:28   CT ABDOMEN PELVIS W CONTRAST  Result Date: 12/28/2020 CLINICAL DATA:  Acute abdominal pain.  Shortness of breath. EXAM: CT CHEST, ABDOMEN, AND PELVIS WITH CONTRAST TECHNIQUE: Multidetector CT imaging of the chest, abdomen and pelvis was performed following the standard protocol during bolus administration of intravenous contrast. CONTRAST:  60mL OMNIPAQUE IOHEXOL 300 MG/ML  SOLN COMPARISON:  Chest CT dated 04/27/2020. CT abdomen dated 05/21/2020. FINDINGS: CT CHEST FINDINGS Cardiovascular: No thoracic aortic aneurysm. No pericardial effusion. Scattered coronary artery calcifications. Mild aortic atherosclerosis. 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 Mediastinum/Nodes: No mass or enlarged lymph nodes within the  mediastinum or perihilar regions. Esophagus appears normal. Trachea and central bronchi are unremarkable. Lungs/Pleura: Small chronic pleural effusions with associated atelectasis. Lungs otherwise clear. No pneumothorax. Musculoskeletal: Mild degenerative spondylosis of the kyphotic thoracic spine. No acute-appearing osseous abnormality. CT ABDOMEN PELVIS FINDINGS Hepatobiliary: No focal liver abnormality is seen. Gallbladder is distended but otherwise unremarkable. No bile duct dilatation is seen. Pancreas: Unremarkable. No pancreatic ductal dilatation or surrounding inflammatory changes. Spleen: Normal in size without focal abnormality. Adrenals/Urinary Tract: Adrenal glands appear normal. Kidneys are unremarkable without suspicious mass, stone or hydronephrosis. No obstructing ureteral stone. Bladder is unremarkable,  partially decompressed. Stomach/Bowel: No dilated large or small bowel loops. Extensive diverticulosis of the transverse, descending and sigmoid colon but no focal inflammatory change to suggest acute diverticulitis. Mildly distended small bowel loops throughout the abdomen and pelvis, with fluid and associated air-fluid levels throughout. Stomach is unremarkable, partially decompressed. Vascular/Lymphatic: No acute-appearing vascular abnormality. Mild aortic atherosclerosis. No enlarged lymph nodes are seen in the abdomen or pelvis. Reproductive: Uterus and bilateral adnexa are unremarkable. Other: Fluid stranding and nodular thickening of the anterior mesentery and central mesentery, most suggestive of omental caking suggesting neoplastic process (peritoneal carcinomatosis. Small amount of free fluid adjacent to the liver and spleen. No circumscribed fluid collection or abscess-like collection is identified. Musculoskeletal: Degenerative spondylosis of the lumbar spine, moderate in degree. No acute or suspicious osseous abnormality. (Lucent lesion within the medial aspect of the LEFT iliac bone, of  uncertain significance, possibly a small benign bone island or degenerative subchondral cyst. No additional similar-appearing lesions within the axial skeleton or osseous pelvis. IMPRESSION: 1. 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 Differential for peritoneal carcinomatosis is primarily neoplastic and includes cancers of the ovary, appendix, colon, pancreas and stomach. PET-CT may be helpful for further characterization. Tissue sampling of the mesentery may be eventually required for diagnosis. 2. Mildly distended small bowel loops throughout the abdomen and pelvis, with fluid and associated air-fluid levels throughout the nondistended small bowel, suggesting a mild ileus versus partial small bowel obstruction. Favor partial small bowel obstruction with transition zone in the RIGHT lower quadrant, likely related to aforementioned neoplastic peritoneal implants and/or adhesions. 3. Small free fluid within the RIGHT upper quadrant and LEFT upper quadrant. No abscess collection seen. No free intraperitoneal air seen. 4. Extensive colonic diverticulosis without evidence of acute diverticulitis. 5. Small chronic pleural effusions with associated atelectasis. Aortic Atherosclerosis (ICD10-I70.0). These results were called by telephone at the time of interpretation on 12/28/2020 at 12:27 pm to provider [facility] , who verbally acknowledged these results. Electronically Signed   By: Franki Cabot M.D.   On: 12/28/2020 12:28   DG Chest Portable 1 View  Result Date: 12/28/2020 CLINICAL DATA:  Severe abdominal pain. Clinical concern for free peritoneal air. EXAM: PORTABLE CHEST 1 VIEW COMPARISON:  05/21/2020 FINDINGS: Normal sized heart. Clear lungs. No free peritoneal air seen. Thoracic spine degenerative changes. IMPRESSION: No free peritoneal air or other  acute abnormality. Electronically Signed   By: Claudie Revering M.D.   On: 12/28/2020 11:58      Assessment/Plan 70 yo female presenting with early satiety, bloating and obstipation. I reviewed her CT scan - there is omental thickening and nodularity, which was not present on a CT 6 months ago. There are some mildly dilated loops of small bowel but the stomach and colon are decompressed. There are no obvious adnexal masses. 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 Differential diagnosis would include ovarian primary, gastric, appendiceal and pancreatic. Colonic/appendiceal are less likely given that she had a normal colonoscopy 2 years ago. The patient is passing some flatus and having occasional bowel movements, and she is not having any vomiting so I do no think immediate decompression is necessary. She likely has a partial obstruction. The best next steps will be to confirm whether or not this is carcinomatosis and if so, to determine the primary source. One option is a diagnostic laparoscopy with biopsies, however we will need to wait until  Plavix has been off for 5 days. Tumor markers have been ordered (CEA, CA19-9, and CA125). Surgery will follow along closely.   Michaelle Birks, [facility], Hepatobiliary and Pancreatic Surgery 12/28/20 1:31 PM

## 2020-12-28 NOTE — H&P (Signed)
History and Physical    Brittany Middleton JSH:[id number] DOB: July 25, 1950 DOA: 12/28/2020  PCP: Brittany Ou, MD  Patient coming from: Home  Chief Complaint: Indigestion/stomach pain  HPI: Brittany Middleton is a 70 y.o. female with medical history significant of HTN, HLD, chronic interstitial cystitis. Presenting with abdominal pain. Her symptoms start 2 weeks ago. She had abdominal spasm across the top of her abdomen that would come on strong and then release on its own. She tried adjusting her diet. She ate yogurt and toast, but after a few days, she didn't want to eat at all. She started having more intense pain: "like I was having a baby". She spoke with her GI doc who prescribed some unspecified medications. However, they didn't help. She began to feel more bloated and nauseous. Last night her bloating increased in intensity. She was able to have a small BM , but it didn't provide full relief. She decided to come to the ED for help. She denies any other aggravating or alleviating factors.   ED Course: CT showed a thickening of the omentum that was concerning for peritoneal carcinomatosis. CT was also concerning for SBO. [facility] was consulted. [facility] was called for admission.   Review of Systems:  Denies CP, dyspnea, palpitations, V/D, fevers, syncopal episodes.  Review of systems is otherwise negative for all not mentioned in HPI.   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   Gastrointestinal bleeding 07/25/2018   Gastrointestinal hemorrhage with melena 07/16/2018   GERD (gastroesophageal reflux disease)    Hepatic steatosis    per imaging 2016, 2019   Hiatal hernia    History of palpitations    Holter monitor, 2017: NSR, occasional PVC, PACs,  no arrhythmia   HLD (hyperlipidemia) 02/27/2018   HTN (hypertension) 02/27/2018   Hypercholesteremia    Hypercholesterolemia 04/07/2017   Hypertension    Hypertensive disorder 04/07/2017   Hypokalemia 07/17/2018   Lumbar disc disease    MRI, 2017    Morbid obesity ([place]) 05/22/2018   Overweight 03/03/2018   Palpitations 08/19/2015   Pyelonephritis 03/30/2020   Sepsis secondary to UTI ([place]) 04/25/2020   Severe sepsis with acute organ dysfunction ([place]) 04/25/2020   Sleep apnea    UTI (urinary tract infection)     PSHx Past Surgical History:  Procedure Laterality Date   BLADDER REPAIR     BREAST EXCISIONAL BIOPSY Left    CARPAL TUNNEL RELEASE Right    CATARACT EXTRACTION, BILATERAL  2014   COLONOSCOPY WITH PROPOFOL N/A 07/18/2018   Procedure: COLONOSCOPY WITH PROPOFOL;  Surgeon: Laurence Spates, MD;  Location: [facility];  Service: Endoscopy;  Laterality: N/A;   ESOPHAGOGASTRODUODENOSCOPY (EGD) WITH PROPOFOL N/A 07/17/2018   Procedure: ESOPHAGOGASTRODUODENOSCOPY (EGD) WITH PROPOFOL;  Surgeon: Laurence Spates, MD;  Location: [facility];  Service: Endoscopy;  Laterality: N/A;   REPAIR RECTOCELE      SocHx  reports that she has never smoked. She has never used smokeless tobacco. She reports that she does not drink alcohol and does not use drugs.  Allergies  Allergen Reactions   Macrobid [Nitrofurantoin] Shortness Of Breath    Short of breath, cough, myalgia   Ciprofloxacin Other (See Comments)    Caused PAIN IN HANDS  Codeine Nausea And Vomiting    FamHx Family History  Problem Relation Age of Onset   CAD Mother    Breast cancer Mother 14    Prior to Admission medications   Medication Sig Start Date End Date Taking? Authorizing Provider  acetaminophen (TYLENOL) 500 MG tablet Take 1,000 mg by mouth every 6 (six) hours as needed for mild pain, moderate pain, fever or headache.   Yes [provider]  albuterol (PROVENTIL) (2.5 MG/3ML) 0.083% nebulizer solution Take 2.5  mg by nebulization every 6 (six) hours as needed for wheezing or shortness of breath.   Yes [provider]  albuterol (VENTOLIN HFA) 108 (90 Base) MCG/ACT inhaler Inhale 2 puffs into the lungs every 6 (six) hours as needed for wheezing or shortness of breath. 04/01/20  Yes Regalado, Belkys A, MD  BIOTIN PO Take 1 tablet by mouth daily.   Yes [provider]  Cholecalciferol (VITAMIN D-3) 25 MCG (1000 UT) CAPS Take 1,000 Units by mouth daily.   Yes [provider]  clopidogrel (PLAVIX) 75 MG tablet Take 1 tablet (75 mg total) by mouth daily. Patient taking differently: Take 75 mg by mouth daily with supper. 06/28/19  Yes Revankar, [name], MD  co-enzyme Q-10 30 MG capsule Take 30 mg by mouth daily.   Yes [provider]  fluticasone (FLOVENT HFA) 110 MCG/ACT inhaler Inhale 2 puffs into the lungs 2 (two) times daily. 05/13/20 05/13/21 Yes Freddi Starr, MD  hydrochlorothiazide (HYDRODIURIL) 25 MG tablet Take 1 tablet (25 mg total) by mouth daily. 04/29/20  Yes Pokhrel, Laxman, MD  losartan (COZAAR) 50 MG tablet Take 1 tablet (50 mg total) by mouth daily. 04/30/20  Yes Pokhrel, Laxman, MD  MELATONIN PO Take 5 mg by mouth daily at 8 pm.   Yes [provider]  pantoprazole (PROTONIX) 40 MG tablet Take 1 tablet (40 mg total) by mouth daily. 04/01/20  Yes Regalado, Belkys A, MD  PREBIOTIC PRODUCT PO Take 2 tablets by mouth daily at 8 pm.   Yes [provider]  rosuvastatin (CRESTOR) 20 MG tablet TAKE ONE TABLET BY MOUTH ONE TIME DAILY 10/29/20  Yes Revankar, [name], MD  Coenzyme Q10-Vitamin E (QUNOL ULTRA COQ10 PO) Take 1 capsule by mouth daily.    [provider]  fluticasone (FLONASE) 50 MCG/ACT nasal spray Place 1 spray into both nostrils daily. Patient taking differently: Place 1 spray into both nostrils daily as needed for allergies or rhinitis. 04/01/20   Regalado, Belkys A, MD  hydrocortisone 2.5 % cream Apply 1 application topically  daily as needed (forehead).  04/13/20   [provider]  IRON PO Take 1 tablet by mouth 2 (two) times a week. Monday, Wednesday    [provider]  Melatonin 5 MG CHEW Chew 5 mg by mouth at bedtime.     [provider]  Multiple Vitamins-Minerals (MULTIVITAMIN WITH MINERALS) tablet Take 1 tablet by mouth daily.    [provider]  phenazopyridine (PYRIDIUM) 200 MG tablet Take 200 mg by mouth every 8 (eight) hours. 06/08/20   [provider]  PRESCRIPTION MEDICATION Inhale into the lungs at bedtime. CPAP    [provider]  Probiotic Product (PROBIOTIC-10 PO) Take 1 capsule by mouth daily.    [provider]    Physical Exam: Vitals:   12/28/20 0934 12/28/20 0938 12/28/20 1100 12/28/20 1235  BP: (!) 142/73  (!) 128/54 (!) 164/67  Pulse: 76  (!) 58 (!) 55  Resp: (!) 22  (!) 22 (!) 22  Temp: 97.8 F (36.6 C)     TempSrc: Oral     SpO2: 100%  95% 100%  Weight:  117.9 kg    Height:  5\' 5"  (1.651 m)      General: 70 y.o. female resting in bed in NAD Eyes: PERRL, normal sclera ENMT: Nares patent w/o discharge, orophaynx clear, dentition normal, ears w/o discharge/lesions/ulcers Neck: Supple, trachea midline Cardiovascular: RRR, +S1, S2, no m/g/r, equal pulses throughout Respiratory: CTABL, no w/r/r, normal WOB GI: BS+, ND, obese, RUQ TTP, no masses noted, no organomegaly noted MSK: No e/c/c Skin: No rashes, bruises, ulcerations noted Neuro: A&O x 3, no focal deficits Psyc: Appropriate interaction and affect, calm/cooperative  Labs on Admission: I have personally reviewed following labs and imaging studies  CBC: Recent Labs  Lab 12/28/20 1057  WBC 5.5  NEUTROABS 3.9  HGB 14.2  HCT 44.1  MCV 88.2  PLT 650   Basic Metabolic Panel: Recent Labs  Lab 12/28/20 1057  NA 138  K 3.4*  CL 100  CO2 30  GLUCOSE 115*  BUN 8  CREATININE 0.67  CALCIUM 9.1   GFR: Estimated Creatinine Clearance: 86.5 mL/min (by C-G  formula based on SCr of 0.67 mg/dL). Liver Function Tests: Recent Labs  Lab 12/28/20 1057  AST 24  ALT 20  ALKPHOS 55  BILITOT 1.1  PROT 7.6  ALBUMIN 4.3   Recent Labs  Lab 12/28/20 1057  LIPASE 30   No results for input(s): AMMONIA in the last 168 hours. Coagulation Profile: No results for input(s): INR, PROTIME in the last 168 hours. Cardiac Enzymes: No results for input(s): CKTOTAL, CKMB, CKMBINDEX, TROPONINI in the last 168 hours. BNP (last 3 results) No results for input(s): PROBNP in the last 8760 hours. HbA1C: No results for input(s): HGBA1C in the last 72 hours. CBG: No results for input(s): GLUCAP in the last 168 hours. Lipid Profile: No results for input(s): CHOL, HDL, LDLCALC, TRIG, CHOLHDL, LDLDIRECT in the last 72 hours. Thyroid Function Tests: No results for input(s): TSH, T4TOTAL, FREET4, T3FREE, THYROIDAB in the last 72 hours. Anemia Panel: No results for input(s): VITAMINB12, FOLATE, FERRITIN, TIBC, IRON, RETICCTPCT in the last 72 hours. Urine analysis:    Component Value Date/Time   COLORURINE AMBER (A) 12/28/2020 1057   APPEARANCEUR CLOUDY (A) 12/28/2020 1057   APPEARANCEUR Clear 05/20/2020 1422   LABSPEC 1.025 12/28/2020 1057   PHURINE 6.0 12/28/2020 1057   GLUCOSEU NEGATIVE 12/28/2020 1057   HGBUR SMALL (A) 12/28/2020 1057   BILIRUBINUR SMALL (A) 12/28/2020 1057   BILIRUBINUR Negative 05/20/2020 1422   KETONESUR NEGATIVE 12/28/2020 1057   PROTEINUR 100 (A) 12/28/2020 1057   UROBILINOGEN 1.0 10/31/2014 1646   NITRITE NEGATIVE 12/28/2020 1057   LEUKOCYTESUR MODERATE (A) 12/28/2020 1057    Radiological Exams on Admission: CT CHEST WO CONTRAST  Result Date: 12/28/2020 CLINICAL DATA:  Acute abdominal pain.  Shortness of breath. EXAM: CT CHEST, ABDOMEN, AND PELVIS WITH CONTRAST TECHNIQUE: Multidetector CT imaging of the chest, abdomen and pelvis was performed following the standard protocol during bolus administration of intravenous contrast.  CONTRAST:  61mL OMNIPAQUE IOHEXOL 300 MG/ML  SOLN COMPARISON:  Chest CT dated 04/27/2020. CT abdomen dated 05/21/2020. FINDINGS: CT CHEST FINDINGS Cardiovascular: No thoracic aortic aneurysm. No pericardial effusion. Scattered coronary artery calcifications. Mild aortic atherosclerosis. 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 Mediastinum/Nodes: No mass or enlarged lymph nodes within the mediastinum or perihilar regions.  Esophagus appears normal. Trachea and central bronchi are unremarkable. Lungs/Pleura: Small chronic pleural effusions with associated atelectasis. Lungs otherwise clear. No pneumothorax. Musculoskeletal: Mild degenerative spondylosis of the kyphotic thoracic spine. No acute-appearing osseous abnormality. CT ABDOMEN PELVIS FINDINGS Hepatobiliary: No focal liver abnormality is seen. Gallbladder is distended but otherwise unremarkable. No bile duct dilatation is seen. Pancreas: Unremarkable. No pancreatic ductal dilatation or surrounding inflammatory changes. Spleen: Normal in size without focal abnormality. Adrenals/Urinary Tract: Adrenal glands appear normal. Kidneys are unremarkable without suspicious mass, stone or hydronephrosis. No obstructing ureteral stone. Bladder is unremarkable, partially decompressed. Stomach/Bowel: No dilated large or small bowel loops. Extensive diverticulosis of the transverse, descending and sigmoid colon but no focal inflammatory change to suggest acute diverticulitis. Mildly distended small bowel loops throughout the abdomen and pelvis, with fluid and associated air-fluid levels throughout. Stomach is unremarkable, partially decompressed. Vascular/Lymphatic: No acute-appearing vascular abnormality. Mild aortic atherosclerosis. No enlarged lymph nodes are seen in the abdomen or pelvis. Reproductive: Uterus and bilateral adnexa are unremarkable. Other: Fluid stranding and nodular thickening of the anterior mesentery  and central mesentery, most suggestive of omental caking suggesting neoplastic process (peritoneal carcinomatosis. Small amount of free fluid adjacent to the liver and spleen. No circumscribed fluid collection or abscess-like collection is identified. Musculoskeletal: Degenerative spondylosis of the lumbar spine, moderate in degree. No acute or suspicious osseous abnormality. (Lucent lesion within the medial aspect of the LEFT iliac bone, of uncertain significance, possibly a small benign bone island or degenerative subchondral cyst. No additional similar-appearing lesions within the axial skeleton or osseous pelvis. IMPRESSION: 1. 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 Differential for peritoneal carcinomatosis is primarily neoplastic and includes cancers of the ovary, appendix, colon, pancreas and stomach. PET-CT may be helpful for further characterization. Tissue sampling of the mesentery may be eventually required for diagnosis. 2. Mildly distended small bowel loops throughout the abdomen and pelvis, with fluid and associated air-fluid levels throughout the nondistended small bowel, suggesting a mild ileus versus partial small bowel obstruction. Favor partial small bowel obstruction with transition zone in the RIGHT lower quadrant, likely related to aforementioned neoplastic peritoneal implants and/or adhesions. 3. Small free fluid within the RIGHT upper quadrant and LEFT upper quadrant. No abscess collection seen. No free intraperitoneal air seen. 4. Extensive colonic diverticulosis without evidence of acute diverticulitis. 5. Small chronic pleural effusions with associated atelectasis. Aortic Atherosclerosis (ICD10-I70.0). These results were called by telephone at the time of interpretation on 12/28/2020 at 12:27 pm to provider [facility] , who  verbally acknowledged these results. Electronically Signed   By: Franki Cabot M.D.   On: 12/28/2020 12:28   CT ABDOMEN PELVIS W CONTRAST  Result Date: 12/28/2020 CLINICAL DATA:  Acute abdominal pain.  Shortness of breath. EXAM: CT CHEST, ABDOMEN, AND PELVIS WITH CONTRAST TECHNIQUE: Multidetector CT imaging of the chest, abdomen and pelvis was performed following the standard protocol during bolus administration of intravenous contrast. CONTRAST:  55mL OMNIPAQUE IOHEXOL 300 MG/ML  SOLN COMPARISON:  Chest CT dated 04/27/2020. CT abdomen dated 05/21/2020. FINDINGS: CT CHEST FINDINGS Cardiovascular: No thoracic aortic aneurysm. No pericardial effusion. Scattered coronary artery calcifications. Mild aortic atherosclerosis. 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 Mediastinum/Nodes: No mass or enlarged lymph nodes within the mediastinum or perihilar regions. Esophagus appears normal. Trachea and central bronchi are unremarkable. Lungs/Pleura: Small chronic pleural effusions with associated atelectasis. Lungs otherwise  clear. No pneumothorax. Musculoskeletal: Mild degenerative spondylosis of the kyphotic thoracic spine. No acute-appearing osseous abnormality. CT ABDOMEN PELVIS FINDINGS Hepatobiliary: No focal liver abnormality is seen. Gallbladder is distended but otherwise unremarkable. No bile duct dilatation is seen. Pancreas: Unremarkable. No pancreatic ductal dilatation or surrounding inflammatory changes. Spleen: Normal in size without focal abnormality. Adrenals/Urinary Tract: Adrenal glands appear normal. Kidneys are unremarkable without suspicious mass, stone or hydronephrosis. No obstructing ureteral stone. Bladder is unremarkable, partially decompressed. Stomach/Bowel: No dilated large or small bowel loops. Extensive diverticulosis of the transverse, descending and sigmoid colon but no focal inflammatory change to suggest acute diverticulitis. Mildly  distended small bowel loops throughout the abdomen and pelvis, with fluid and associated air-fluid levels throughout. Stomach is unremarkable, partially decompressed. Vascular/Lymphatic: No acute-appearing vascular abnormality. Mild aortic atherosclerosis. No enlarged lymph nodes are seen in the abdomen or pelvis. Reproductive: Uterus and bilateral adnexa are unremarkable. Other: Fluid stranding and nodular thickening of the anterior mesentery and central mesentery, most suggestive of omental caking suggesting neoplastic process (peritoneal carcinomatosis. Small amount of free fluid adjacent to the liver and spleen. No circumscribed fluid collection or abscess-like collection is identified. Musculoskeletal: Degenerative spondylosis of the lumbar spine, moderate in degree. No acute or suspicious osseous abnormality. (Lucent lesion within the medial aspect of the LEFT iliac bone, of uncertain significance, possibly a small benign bone island or degenerative subchondral cyst. No additional similar-appearing lesions within the axial skeleton or osseous pelvis. IMPRESSION: 1. 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 Differential for peritoneal carcinomatosis is primarily neoplastic and includes cancers of the ovary, appendix, colon, pancreas and stomach. PET-CT may be helpful for further characterization. Tissue sampling of the mesentery may be eventually required for diagnosis. 2. Mildly distended small bowel loops throughout the abdomen and pelvis, with fluid and associated air-fluid levels throughout the nondistended small bowel, suggesting a mild ileus versus partial small bowel obstruction. Favor partial small bowel obstruction with transition zone in the RIGHT lower quadrant, likely related to aforementioned neoplastic peritoneal implants and/or adhesions. 3.  Small free fluid within the RIGHT upper quadrant and LEFT upper quadrant. No abscess collection seen. No free intraperitoneal air seen. 4. Extensive colonic diverticulosis without evidence of acute diverticulitis. 5. Small chronic pleural effusions with associated atelectasis. Aortic Atherosclerosis (ICD10-I70.0). These results were called by telephone at the time of interpretation on 12/28/2020 at 12:27 pm to provider [facility] , who verbally acknowledged these results. Electronically Signed   By: Franki Cabot M.D.   On: 12/28/2020 12:28   DG Chest Portable 1 View  Result Date: 12/28/2020 CLINICAL DATA:  Severe abdominal pain. Clinical concern for free peritoneal air. EXAM: PORTABLE CHEST 1 VIEW COMPARISON:  05/21/2020 FINDINGS: Normal sized heart. Clear lungs. No free peritoneal air seen. Thoracic spine degenerative changes. IMPRESSION: No free peritoneal air or other acute abnormality. Electronically Signed   By: Claudie Revering M.D.   On: 12/28/2020 11:58    EKG: None at bedside  Assessment/Plan SBO     - admit to inpt, med-surg     - NGT, fluids, NPO for now     - defer to general surgery for further imaging  ?Peritoneal Carcinomatosis     - will d/w general surgery best approach to biopsy     - she will need a CTH to complete staging; she just had contrast today; that can be ordered tomorrow     -  tumor markers sent  Hypokalemia     - replace K+, check Mg2+  HTN     - PRN hydralazine for now     - once off NPO status, resume home regimen  GERD     - protonix  HLD    - resume home statin once off NPO status  Chronic interstitial cystitis     - check UCx, hold on abx for now  Hx of coronary artery aneurysm     - resume plavix, statin when off NPO status  Morbid obesity     - counsel on diet, lifestyle changes; follow up outpt  DVT prophylaxis: SCDs  Code Status: FULL  Family Communication: None at bedside  Consults called: EDP spoke with general surgery   Status is:  Inpatient  Remains inpatient appropriate because:Inpatient level of care appropriate due to severity of illness  Dispo: The patient is from: Home              Anticipated d/c is to: Home              Patient currently is not medically stable to d/c.   Difficult to place patient No  Time spent coordinating admission: 60 minutes  [facility]  If 7PM-7AM, please contact night-coverage [email or web address]  12/28/2020, 1:54 PM

## 2020-12-28 NOTE — ED Notes (Signed)
Pt declines insertion of NG tube at this time. Prefers to wait and see if she will need endoscopic procedure. Comfortable at this time, not experiencing bloating or abdominal pain.

## 2020-12-28 NOTE — ED Triage Notes (Signed)
Patient states she has abdominal pain and bloating x1 week, states she 'feels about 9 months pregnant and her abdomen is 'as hard as a rock.' Decreased appetite, states last good BM was 10 days ago. Used glycerin suppository yesterday with very small BM.

## 2020-12-28 NOTE — ED Provider Notes (Signed)
[facility] DEPT Provider Note   CSN: [id number] Arrival date & time: 12/28/20  5885     History Chief Complaint  Patient presents with   Abdominal Pain    Brittany Middleton is a 70 y.o. female.  HPI Patient is a 71 year old female with a past medical history significant for interstitial cystitis, anemia, asthma, HLD, HTN, hypokalemia, morbid obesity, pyelonephritis, sleep apnea  Patient presents to the ER today with concerns of abdominal pain that is been ongoing over the past week.  She states that she is not pooped since yesterday morning.  She states that this is abnormal for her.  She also states that she has had nausea without vomiting.  She denies any chest pain or shortness of breath.  She states that her abdominal pain is achy diffuse and she states that she feels bloated.  She has decreased appetite and states that apart from small amount of stool that she passed with a glycerin suppository yesterday morning she has not had a normal bowel movement in 10 days.  Denies any melena or hematochezia.  Denies any fevers or chills.  No other associate symptoms.  No history of cancer.  She denies any use of blood thinners.    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   Gastrointestinal bleeding 07/25/2018   Gastrointestinal hemorrhage with melena 07/16/2018   GERD (gastroesophageal reflux disease)    Hepatic steatosis    per imaging 2016, 2019   Hiatal hernia    History of palpitations    Holter monitor, 2017: NSR, occasional PVC, PACs, no arrhythmia   HLD (hyperlipidemia) 02/27/2018   HTN (hypertension) 02/27/2018   Hypercholesteremia    Hypercholesterolemia 04/07/2017   Hypertension    Hypertensive disorder  04/07/2017   Hypokalemia 07/17/2018   Lumbar disc disease    MRI, 2017    Morbid obesity ([place]) 05/22/2018   Overweight 03/03/2018   Palpitations 08/19/2015   Pyelonephritis 03/30/2020   Sepsis secondary to UTI ([place]) 04/25/2020   Severe sepsis with acute organ dysfunction ([place]) 04/25/2020   Sleep apnea    UTI (urinary tract infection)     Patient Active Problem List   Diagnosis Date Noted   SBO (small bowel obstruction) ([place]) 12/28/2020   Abdominal pain 05/28/2020   Recurrent UTI 05/28/2020   Severe sepsis ([place]) 05/21/2020   Asthma    Sleep apnea    Severe sepsis with acute organ dysfunction ([place]) 04/25/2020   Sepsis secondary to UTI ([place]) 04/25/2020   Cervical disc disease    Hepatic steatosis    History of palpitations    Hypercholesteremia    Hypertension    Lumbar disc disease    Urinary tract infection due to extended-spectrum beta lactamase (ESBL) producing Escherichia coli    Pyelonephritis 03/30/2020   Ascending aortic aneurysm ([place]) 05/23/2019   Gastrointestinal bleeding 07/25/2018   Hiatal hernia    Anemia 07/17/2018   Acute blood loss anemia 07/17/2018   Hypokalemia 07/17/2018   Gastrointestinal hemorrhage with melena 07/16/2018   Morbid obesity ([place]) 05/22/2018   Overweight 03/03/2018   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   Chest pain with moderate risk for cardiac etiology 02/27/2018   HTN (hypertension) 02/27/2018   HLD (hyperlipidemia) 02/27/2018  Hypercholesterolemia 04/07/2017   Hypertensive disorder 04/07/2017   Palpitations 08/19/2015    Past Surgical History:  Procedure Laterality Date   BLADDER REPAIR     BREAST EXCISIONAL BIOPSY Left    CARPAL TUNNEL RELEASE Right    CATARACT EXTRACTION, BILATERAL  2014   COLONOSCOPY WITH PROPOFOL N/A 07/18/2018   Procedure: COLONOSCOPY WITH PROPOFOL;  Surgeon: Laurence Spates, MD;  Location: [facility];  Service: Endoscopy;  Laterality: N/A;   ESOPHAGOGASTRODUODENOSCOPY (EGD) WITH PROPOFOL N/A 07/17/2018    Procedure: ESOPHAGOGASTRODUODENOSCOPY (EGD) WITH PROPOFOL;  Surgeon: Laurence Spates, MD;  Location: [facility];  Service: Endoscopy;  Laterality: N/A;   REPAIR RECTOCELE       OB History   No obstetric history on file.     Family History  Problem Relation Age of Onset   CAD Mother    Breast cancer Mother 24    Social History   Tobacco Use   Smoking status: Never   Smokeless tobacco: Never  Vaping Use   Vaping Use: Never used  Substance Use Topics   Alcohol use: No   Drug use: No    Home Medications Prior to Admission medications   Medication Sig Start Date End Date Taking? Authorizing Provider  acetaminophen (TYLENOL) 500 MG tablet Take 1,000 mg by mouth every 6 (six) hours as needed for mild pain, moderate pain, fever or headache.    [provider]  albuterol (PROVENTIL) (2.5 MG/3ML) 0.083% nebulizer solution Take 2.5 mg by nebulization every 6 (six) hours as needed for wheezing or shortness of breath.    [provider]  albuterol (VENTOLIN HFA) 108 (90 Base) MCG/ACT inhaler Inhale 2 puffs into the lungs every 6 (six) hours as needed for wheezing or shortness of breath. 04/01/20   Regalado, Belkys A, MD  BIOTIN PO Take 1 tablet by mouth daily.    [provider]  Cholecalciferol (VITAMIN D-3) 25 MCG (1000 UT) CAPS Take 1,000 Units by mouth daily.    [provider]  clopidogrel (PLAVIX) 75 MG tablet Take 1 tablet (75 mg total) by mouth daily. Patient taking differently: Take 75 mg by mouth daily with supper. 06/28/19   Revankar, [name], MD  Coenzyme Q10-Vitamin E (QUNOL ULTRA COQ10 PO) Take 1 capsule by mouth daily.    [provider]  fluticasone (FLONASE) 50 MCG/ACT nasal spray Place 1 spray into both nostrils daily. Patient taking differently: Place 1 spray into both nostrils daily as needed for allergies or rhinitis. 04/01/20   Regalado, Belkys A, MD  fluticasone (FLOVENT HFA) 110 MCG/ACT inhaler Inhale 2 puffs into the  lungs 2 (two) times daily. 05/13/20 05/13/21  Freddi Starr, MD  hydrochlorothiazide (HYDRODIURIL) 25 MG tablet Take 1 tablet (25 mg total) by mouth daily. 04/29/20   Pokhrel, Corrie Mckusick, MD  hydrocortisone 2.5 % cream Apply 1 application topically daily as needed (forehead).  04/13/20   [provider]  IRON PO Take 1 tablet by mouth 2 (two) times a week. Monday, Wednesday    [provider]  losartan (COZAAR) 50 MG tablet Take 1 tablet (50 mg total) by mouth daily. 04/30/20   Pokhrel, Corrie Mckusick, MD  Melatonin 5 MG CHEW Chew 5 mg by mouth at bedtime.     [provider]  Multiple Vitamins-Minerals (MULTIVITAMIN WITH MINERALS) tablet Take 1 tablet by mouth daily.    [provider]  pantoprazole (PROTONIX) 40 MG tablet Take 1 tablet (40 mg total) by mouth daily. 04/01/20   Regalado, Belkys A,  MD  phenazopyridine (PYRIDIUM) 200 MG tablet Take 200 mg by mouth every 8 (eight) hours. 06/08/20   [provider]  PRESCRIPTION MEDICATION Inhale into the lungs at bedtime. CPAP    [provider]  Probiotic Product (PROBIOTIC-10 PO) Take 1 capsule by mouth daily.    [provider]  rosuvastatin (CRESTOR) 20 MG tablet TAKE ONE TABLET BY MOUTH ONE TIME DAILY 10/29/20   Revankar, [name], MD    Allergies    Macrobid [nitrofurantoin], Ciprofloxacin, and Codeine  Review of Systems   Review of Systems  Constitutional:  Positive for appetite change. Negative for chills and fever.  HENT:  Negative for congestion.   Eyes:  Negative for pain.  Respiratory:  Negative for cough and shortness of breath.   Cardiovascular:  Negative for chest pain and leg swelling.  Gastrointestinal:  Positive for abdominal pain, constipation and nausea. Negative for diarrhea and vomiting.  Genitourinary:  Negative for dysuria.  Musculoskeletal:  Negative for myalgias.  Skin:  Negative for rash.  Neurological:  Negative for dizziness and headaches.   Physical  Exam Updated Vital Signs BP (!) 164/67 (BP Location: Left Arm)   Pulse (!) 55   Temp 97.8 F (36.6 C) (Oral)   Resp (!) 22   Ht 5\' 5"  (1.651 m)   Wt 117.9 kg   SpO2 100%   BMI 43.27 kg/m   Physical Exam Vitals and nursing note reviewed.  Constitutional:      General: She is not in acute distress.    Appearance: She is obese.  HENT:     Head: Normocephalic and atraumatic.     Nose: Nose normal.  Eyes:     General: No scleral icterus. Cardiovascular:     Rate and Rhythm: Normal rate and regular rhythm.     Pulses: Normal pulses.     Heart sounds: Normal heart sounds.  Pulmonary:     Effort: Pulmonary effort is normal. No respiratory distress.     Breath sounds: No wheezing.  Abdominal:     Palpations: Abdomen is soft.     Tenderness: There is abdominal tenderness.     Comments: Abdomen is obese, generalized tenderness is present.  No guarding or rebound.  Musculoskeletal:     Cervical back: Normal range of motion.     Right lower leg: No edema.     Left lower leg: No edema.  Skin:    General: Skin is warm and dry.     Capillary Refill: Capillary refill takes less than 2 seconds.  Neurological:     Mental Status: She is alert. Mental status is at baseline.  Psychiatric:        Mood and Affect: Mood normal.        Behavior: Behavior normal.    ED Results / Procedures / Treatments   Labs (all labs ordered are listed, but only abnormal results are displayed) Labs Reviewed  COMPREHENSIVE METABOLIC PANEL - Abnormal; Notable for the following components:      Result Value   Potassium 3.4 (*)    Glucose, Bld 115 (*)    All other components within normal limits  URINALYSIS, ROUTINE W REFLEX MICROSCOPIC - Abnormal; Notable for the following components:   Color, Urine AMBER (*)    APPearance CLOUDY (*)    Hgb urine dipstick SMALL (*)    Bilirubin Urine SMALL (*)    Protein, ur 100 (*)    Leukocytes,Ua MODERATE (*)    WBC, UA >50 (*)  Bacteria, UA MANY (*)    Non  Squamous Epithelial 0-5 (*)    All other components within normal limits  URINE CULTURE  RESP PANEL BY RT-PCR (FLU A&B, COVID) [id number]  CBC WITH DIFFERENTIAL/PLATELET  LIPASE, BLOOD  LACTIC ACID, PLASMA    EKG None  Radiology CT CHEST WO CONTRAST  Result Date: 12/28/2020 CLINICAL DATA:  Acute abdominal pain.  Shortness of breath. EXAM: CT CHEST, ABDOMEN, AND PELVIS WITH CONTRAST TECHNIQUE: Multidetector CT imaging of the chest, abdomen and pelvis was performed following the standard protocol during bolus administration of intravenous contrast. CONTRAST:  32mL OMNIPAQUE IOHEXOL 300 MG/ML  SOLN COMPARISON:  Chest CT dated 04/27/2020. CT abdomen dated 05/21/2020. FINDINGS: CT CHEST FINDINGS Cardiovascular: No thoracic aortic aneurysm. No pericardial effusion. Scattered coronary artery calcifications. Mild aortic atherosclerosis. 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 Mediastinum/Nodes: No mass or enlarged lymph nodes within the mediastinum or perihilar regions. Esophagus appears normal. Trachea and central bronchi are unremarkable. Lungs/Pleura: Small chronic pleural effusions with associated atelectasis. Lungs otherwise clear. No pneumothorax. Musculoskeletal: Mild degenerative spondylosis of the kyphotic thoracic spine. No acute-appearing osseous abnormality. CT ABDOMEN PELVIS FINDINGS Hepatobiliary: No focal liver abnormality is seen. Gallbladder is distended but otherwise unremarkable. No bile duct dilatation is seen. Pancreas: Unremarkable. No pancreatic ductal dilatation or surrounding inflammatory changes. Spleen: Normal in size without focal abnormality. Adrenals/Urinary Tract: Adrenal glands appear normal. Kidneys are unremarkable without suspicious mass, stone or hydronephrosis. No obstructing ureteral stone. Bladder is unremarkable, partially decompressed. Stomach/Bowel: No dilated large or small bowel loops. Extensive diverticulosis of  the transverse, descending and sigmoid colon but no focal inflammatory change to suggest acute diverticulitis. Mildly distended small bowel loops throughout the abdomen and pelvis, with fluid and associated air-fluid levels throughout. Stomach is unremarkable, partially decompressed. Vascular/Lymphatic: No acute-appearing vascular abnormality. Mild aortic atherosclerosis. No enlarged lymph nodes are seen in the abdomen or pelvis. Reproductive: Uterus and bilateral adnexa are unremarkable. Other: Fluid stranding and nodular thickening of the anterior mesentery and central mesentery, most suggestive of omental caking suggesting neoplastic process (peritoneal carcinomatosis. Small amount of free fluid adjacent to the liver and spleen. No circumscribed fluid collection or abscess-like collection is identified. Musculoskeletal: Degenerative spondylosis of the lumbar spine, moderate in degree. No acute or suspicious osseous abnormality. (Lucent lesion within the medial aspect of the LEFT iliac bone, of uncertain significance, possibly a small benign bone island or degenerative subchondral cyst. No additional similar-appearing lesions within the axial skeleton or osseous pelvis. IMPRESSION: 1. 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 Differential for peritoneal carcinomatosis is primarily neoplastic and includes cancers of the ovary, appendix, colon, pancreas and stomach. PET-CT may be helpful for further characterization. Tissue sampling of the mesentery may be eventually required for diagnosis. 2. Mildly distended small bowel loops throughout the abdomen and pelvis, with fluid and associated air-fluid levels throughout the nondistended small bowel, suggesting a mild ileus versus partial small bowel obstruction. Favor partial small bowel obstruction with transition  zone in the RIGHT lower quadrant, likely related to aforementioned neoplastic peritoneal implants and/or adhesions. 3. Small free fluid within the RIGHT upper quadrant and LEFT upper quadrant. No abscess collection seen. No free intraperitoneal air seen. 4. Extensive colonic diverticulosis without evidence of acute diverticulitis. 5. Small chronic pleural effusions with associated atelectasis. Aortic Atherosclerosis (ICD10-I70.0). These results were called by telephone at the time of  interpretation on 12/28/2020 at 12:27 pm to provider [facility] , who verbally acknowledged these results. Electronically Signed   By: Franki Cabot M.D.   On: 12/28/2020 12:28   CT ABDOMEN PELVIS W CONTRAST  Result Date: 12/28/2020 CLINICAL DATA:  Acute abdominal pain.  Shortness of breath. EXAM: CT CHEST, ABDOMEN, AND PELVIS WITH CONTRAST TECHNIQUE: Multidetector CT imaging of the chest, abdomen and pelvis was performed following the standard protocol during bolus administration of intravenous contrast. CONTRAST:  9mL OMNIPAQUE IOHEXOL 300 MG/ML  SOLN COMPARISON:  Chest CT dated 04/27/2020. CT abdomen dated 05/21/2020. FINDINGS: CT CHEST FINDINGS Cardiovascular: No thoracic aortic aneurysm. No pericardial effusion. Scattered coronary artery calcifications. Mild aortic atherosclerosis. 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 Mediastinum/Nodes: No mass or enlarged lymph nodes within the mediastinum or perihilar regions. Esophagus appears normal. Trachea and central bronchi are unremarkable. Lungs/Pleura: Small chronic pleural effusions with associated atelectasis. Lungs otherwise clear. No pneumothorax. Musculoskeletal: Mild degenerative spondylosis of the kyphotic thoracic spine. No acute-appearing osseous abnormality. CT ABDOMEN PELVIS FINDINGS Hepatobiliary: No focal liver abnormality is seen. Gallbladder is distended but otherwise unremarkable. No bile duct dilatation is  seen. Pancreas: Unremarkable. No pancreatic ductal dilatation or surrounding inflammatory changes. Spleen: Normal in size without focal abnormality. Adrenals/Urinary Tract: Adrenal glands appear normal. Kidneys are unremarkable without suspicious mass, stone or hydronephrosis. No obstructing ureteral stone. Bladder is unremarkable, partially decompressed. Stomach/Bowel: No dilated large or small bowel loops. Extensive diverticulosis of the transverse, descending and sigmoid colon but no focal inflammatory change to suggest acute diverticulitis. Mildly distended small bowel loops throughout the abdomen and pelvis, with fluid and associated air-fluid levels throughout. Stomach is unremarkable, partially decompressed. Vascular/Lymphatic: No acute-appearing vascular abnormality. Mild aortic atherosclerosis. No enlarged lymph nodes are seen in the abdomen or pelvis. Reproductive: Uterus and bilateral adnexa are unremarkable. Other: Fluid stranding and nodular thickening of the anterior mesentery and central mesentery, most suggestive of omental caking suggesting neoplastic process (peritoneal carcinomatosis. Small amount of free fluid adjacent to the liver and spleen. No circumscribed fluid collection or abscess-like collection is identified. Musculoskeletal: Degenerative spondylosis of the lumbar spine, moderate in degree. No acute or suspicious osseous abnormality. (Lucent lesion within the medial aspect of the LEFT iliac bone, of uncertain significance, possibly a small benign bone island or degenerative subchondral cyst. No additional similar-appearing lesions within the axial skeleton or osseous pelvis. IMPRESSION: 1. 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 Differential for peritoneal carcinomatosis is primarily neoplastic and includes cancers of the ovary,  appendix, colon, pancreas and stomach. PET-CT may be helpful for further characterization. Tissue sampling of the mesentery may be eventually required for diagnosis. 2. Mildly distended small bowel loops throughout the abdomen and pelvis, with fluid and associated air-fluid levels throughout the nondistended small bowel, suggesting a mild ileus versus partial small bowel obstruction. Favor partial small bowel obstruction with transition zone in the RIGHT lower quadrant, likely related to aforementioned neoplastic peritoneal implants and/or adhesions. 3. Small free fluid within the RIGHT upper quadrant and LEFT upper quadrant. No abscess collection seen. No free intraperitoneal air seen. 4. Extensive colonic diverticulosis without evidence of acute diverticulitis. 5. Small chronic pleural effusions with associated atelectasis. Aortic Atherosclerosis (ICD10-I70.0). These results were called by telephone at the time of interpretation on 12/28/2020 at 12:27 pm to provider [facility] , who verbally acknowledged these results. Electronically Signed  By: Franki Cabot M.D.   On: 12/28/2020 12:28   DG Chest Portable 1 View  Result Date: 12/28/2020 CLINICAL DATA:  Severe abdominal pain. Clinical concern for free peritoneal air. EXAM: PORTABLE CHEST 1 VIEW COMPARISON:  05/21/2020 FINDINGS: Normal sized heart. Clear lungs. No free peritoneal air seen. Thoracic spine degenerative changes. IMPRESSION: No free peritoneal air or other acute abnormality. Electronically Signed   By: Claudie Revering M.D.   On: 12/28/2020 11:58    Procedures Procedures   Medications Ordered in ED Medications  morphine 4 MG/ML injection 4 mg (4 mg Intravenous Given 12/28/20 1055)  ondansetron (ZOFRAN) injection 4 mg (4 mg Intravenous Given 12/28/20 1055)  dicyclomine (BENTYL) injection 20 mg (20 mg Intramuscular Given 12/28/20 1040)  iohexol (OMNIPAQUE) 300 MG/ML solution 60 mL (60 mLs Intravenous Contrast Given 12/28/20 1142)    ED Course   I have reviewed the triage vital signs and the nursing notes.  Pertinent labs & imaging results that were available during my care of the patient were reviewed by me and considered in my medical decision making (see chart for details).    MDM Rules/Calculators/A&P                          Patient is 70 year old female with past medical history detailed in HPI presented today for 1 week of abdominal pain worse over the past 2 days with decreased bowel movements.  No vomiting.  Generalized abdominal tenderness  CT scan shows a partial small bowel obstruction with peritoneal carcinomatosis with no obvious primary lesion.  Discussed with Michaelle Birks, MD of general surgery recommends hospitalist admission.  CMP without any significant abnormalities.  Lactic acid within normal meds CBC without leukocytosis or anemia.  Lipase within norm limits.  COVID test ordered and pending at this time.  Urinalysis abnormal patient is at having no urinary symptoms has a history of interstitial cystitis urine culture pending will defer to hospitalist for initiation antibiotics.  I discussed patient's findings with her she is understanding of the plan for admission.  Discussed with Dr. 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

## 2020-12-28 NOTE — Progress Notes (Signed)
Dr. Marylyn Ishihara, MD was paged about patient's request for a muscle spasm med. Waiting on further orders.

## 2020-12-28 NOTE — Progress Notes (Signed)
Handbook given to patient.  

## 2020-12-28 NOTE — Plan of Care (Signed)
  Problem: Education: Goal: Knowledge of General Education information will improve Description Including pain rating scale, medication(s)/side effects and non-pharmacologic comfort measures Outcome: Progressing   

## 2020-12-28 NOTE — Progress Notes (Signed)
Messaged Dr. Marylyn Ishihara about patient's refusal of NG tube and patient's no complaints of nausea and vomiting.

## 2020-12-29 LAB — CBC
HCT: 38.6 % (ref 36.0–46.0)
Hemoglobin: 12.4 g/dL (ref 12.0–15.0)
MCH: 28.8 pg (ref 26.0–34.0)
MCHC: 32.1 g/dL (ref 30.0–36.0)
MCV: 89.6 fL (ref 80.0–100.0)
Platelets: 223 10*3/uL (ref 150–400)
RBC: 4.31 MIL/uL (ref 3.87–5.11)
RDW: 14.1 % (ref 11.5–15.5)
WBC: 3.2 10*3/uL — ABNORMAL LOW (ref 4.0–10.5)
nRBC: 0 % (ref 0.0–0.2)

## 2020-12-29 LAB — COMPREHENSIVE METABOLIC PANEL
ALT: 16 U/L (ref 0–44)
AST: 23 U/L (ref 15–41)
Albumin: 3.3 g/dL — ABNORMAL LOW (ref 3.5–5.0)
Alkaline Phosphatase: 42 U/L (ref 38–126)
Anion gap: 6 (ref 5–15)
BUN: 9 mg/dL (ref 8–23)
CO2: 27 mmol/L (ref 22–32)
Calcium: 8.3 mg/dL — ABNORMAL LOW (ref 8.9–10.3)
Chloride: 106 mmol/L (ref 98–111)
Creatinine, Ser: 0.58 mg/dL (ref 0.44–1.00)
GFR, Estimated: >60 mL/min (ref >60)
Glucose, Bld: 97 mg/dL (ref 70–99)
Potassium: 3.7 mmol/L (ref 3.5–5.1)
Sodium: 139 mmol/L (ref 135–145)
Total Bilirubin: 0.9 mg/dL (ref 0.3–1.2)
Total Protein: 6 g/dL — ABNORMAL LOW (ref 6.5–8.1)

## 2020-12-29 MED ORDER — SODIUM CHLORIDE 0.9 % IV SOLN: INTRAVENOUS | Status: DC

## 2020-12-29 NOTE — Progress Notes (Signed)
Initial Nutrition Assessment  DOCUMENTATION CODES:   Morbid obesity  INTERVENTION:  - diet advancement as medically feasible. - will monitor for need for nutrition support if unable to tolerate PO nutrition.    NUTRITION DIAGNOSIS:   Inadequate oral intake related to inability to eat as evidenced by NPO status.   GOAL:   Patient will meet greater than or equal to 90% of their needs  MONITOR:   Diet advancement, Labs, Weight trends  REASON FOR ASSESSMENT:   Malnutrition Screening Tool  ASSESSMENT:   70 y.o. female with medical history of HTN, HLD, and chronic interstitial cystitis. She presented to the ED d/t abdominal pain x2 weeks. She had progressive nausea and feeling of being bloated.  She has been NPO since admission. No NGT d/t refusal for placement on 7/10.   She reports that appetite and intakes were normal until about 1.5 weeks ago as abdominal pain began to worsen. She tried to eat a soft, bland diet to not exacerbate pain (such as toast with butter, yogurt, pudding) but for the past few days pain has been so severe and she continued to not have a BM so she did not eat at all.   Weight yesterday was 260 lb, which appears to be a stated weight. Weight on 07/14/20 was 265 lb which would indicate 5 lb weight loss (2% body weight); insignificant.   Per notes: - partial SBO - probable peritoneal carcinomatosis which will require diagnostic laparoscopy with biopsies--need to wait 5 days after holding plavix - hypokalemia--resolved s/p repletion - chronic interstitial cystitis    Labs reviewed; Ca: 8.3 mg/dl.  Medications reviewed; 40 mg IV protonix/day, 10 mEq IV KCl x1 run 7/11. IVF; NS @ 75 ml/hr.     NUTRITION - FOCUSED PHYSICAL EXAM:  Completed; no muscle or fat depletions.    Diet Order:   Diet Order             Diet NPO time specified Except for: Ice Chips, Sips with Meds  Diet effective now                   EDUCATION NEEDS:   No  education needs have been identified at this time  Skin:  Skin Assessment: Reviewed RN Assessment  Last BM:  7/7, per patient report  Height:   Ht Readings from Last 1 Encounters:  12/28/20 5\' 5"  (1.651 m)    Weight:   Wt Readings from Last 1 Encounters:  12/28/20 117.9 kg      Estimated Nutritional Needs:  Kcal:  1800-2000 kcal Protein:  90-105 grams Fluid:  >/= 2 L/day       Jarome Matin, MS, RD, LDN, CNSC Inpatient Clinical Dietitian RD pager # available in [place]  After hours/weekend pager # available in [place] [facility]

## 2020-12-29 NOTE — Progress Notes (Signed)
Progress Note     Subjective: Patient reports some abdominal soreness this AM but pain better than on admit. She is passing some flatus but no BM. Denies nausea or vomiting. Son at bedside this AM. Patient initially reported to me that she would prefer transfer to [facility] since her other physicians are there but RN updated at 10:30 that she is ok with staying here now.   Objective: Vital signs in last 24 hours: Temp:  [97.8 F (36.6 C)-99.1 F (37.3 C)] 98.2 F (36.8 C) (07/11 0526) Pulse Rate:  [52-76] 55 (07/11 0526) Resp:  [15-22] 16 (07/11 0526) BP: (110-188)/(45-76) 142/65 (07/11 0526) SpO2:  [90 %-100 %] 94 % (07/11 0526) Weight:  [117.9 kg] 117.9 kg (07/10 0938) Last BM Date: 12/25/20  Intake/Output from previous day: 07/10 0701 - 07/11 0700 In: 139.2 [I.V.:60.4; IV Piggyback:78.8] Out: -  Intake/Output this shift: No intake/output data recorded.  PE: General: pleasant, WD, obese female who is laying in bed in NAD HEENT: sclera anicteric  Heart: regular, rate, and rhythm.  Normal s1,s2. No obvious murmurs, gallops, or rubs noted.  Palpable pedal pulses bilaterally Lungs: CTAB, no wheezes, rhonchi, or rales noted.  Respiratory effort nonlabored Abd: soft, mild generalized ttp, mild distention, BS hypoactive Psych: A&Ox3 with an appropriate affect.    Lab Results:  Recent Labs    12/28/20 1057 12/29/20 0323  WBC 5.5 3.2*  HGB 14.2 12.4  HCT 44.1 38.6  PLT 274 223   BMET Recent Labs    12/28/20 1057 12/29/20 0323  NA 138 139  K 3.4* 3.7  CL 100 106  CO2 30 27  GLUCOSE 115* 97  BUN 8 9  CREATININE 0.67 0.58  CALCIUM 9.1 8.3*   PT/INR No results for input(s): LABPROT, INR in the last 72 hours. CMP     Component Value Date/Time   NA 139 12/29/2020 0323   NA 143 12/20/2019 0829   K 3.7 12/29/2020 0323   CL 106 12/29/2020 0323   CO2 27 12/29/2020 0323   GLUCOSE 97 12/29/2020 0323   BUN 9 12/29/2020 0323   BUN 16 12/20/2019 0829   CREATININE 0.58  12/29/2020 0323   CALCIUM 8.3 (L) 12/29/2020 0323   PROT 6.0 (L) 12/29/2020 0323   PROT 6.4 02/04/2020 1036   ALBUMIN 3.3 (L) 12/29/2020 0323   ALBUMIN 4.2 02/04/2020 1036   AST 23 12/29/2020 0323   ALT 16 12/29/2020 0323   ALKPHOS 42 12/29/2020 0323   BILITOT 0.9 12/29/2020 0323   BILITOT 0.7 02/04/2020 1036   GFRNONAA >60 12/29/2020 0323   GFRAA 104 12/20/2019 0829   Lipase     Component Value Date/Time   LIPASE 30 12/28/2020 1057       Studies/Results: CT CHEST WO CONTRAST  Result Date: 12/28/2020 CLINICAL DATA:  Acute abdominal pain.  Shortness of breath. EXAM: CT CHEST, ABDOMEN, AND PELVIS WITH CONTRAST TECHNIQUE: Multidetector CT imaging of the chest, abdomen and pelvis was performed following the standard protocol during bolus administration of intravenous contrast. CONTRAST:  25mL OMNIPAQUE IOHEXOL 300 MG/ML  SOLN COMPARISON:  Chest CT dated 04/27/2020. CT abdomen dated 05/21/2020. FINDINGS: CT CHEST FINDINGS Cardiovascular: No thoracic aortic aneurysm. No pericardial effusion. Scattered coronary artery calcifications. Mild aortic atherosclerosis. 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 Mediastinum/Nodes: No mass or enlarged lymph nodes within the mediastinum or perihilar regions. Esophagus appears normal. Trachea and central bronchi are unremarkable. Lungs/Pleura: Small chronic pleural effusions with associated atelectasis.  Lungs otherwise clear. No pneumothorax. Musculoskeletal: Mild degenerative spondylosis of the kyphotic thoracic spine. No acute-appearing osseous abnormality. CT ABDOMEN PELVIS FINDINGS Hepatobiliary: No focal liver abnormality is seen. Gallbladder is distended but otherwise unremarkable. No bile duct dilatation is seen. Pancreas: Unremarkable. No pancreatic ductal dilatation or surrounding inflammatory changes. Spleen: Normal in size without focal abnormality. Adrenals/Urinary Tract: Adrenal glands appear  normal. Kidneys are unremarkable without suspicious mass, stone or hydronephrosis. No obstructing ureteral stone. Bladder is unremarkable, partially decompressed. Stomach/Bowel: No dilated large or small bowel loops. Extensive diverticulosis of the transverse, descending and sigmoid colon but no focal inflammatory change to suggest acute diverticulitis. Mildly distended small bowel loops throughout the abdomen and pelvis, with fluid and associated air-fluid levels throughout. Stomach is unremarkable, partially decompressed. Vascular/Lymphatic: No acute-appearing vascular abnormality. Mild aortic atherosclerosis. No enlarged lymph nodes are seen in the abdomen or pelvis. Reproductive: Uterus and bilateral adnexa are unremarkable. Other: Fluid stranding and nodular thickening of the anterior mesentery and central mesentery, most suggestive of omental caking suggesting neoplastic process (peritoneal carcinomatosis. Small amount of free fluid adjacent to the liver and spleen. No circumscribed fluid collection or abscess-like collection is identified. Musculoskeletal: Degenerative spondylosis of the lumbar spine, moderate in degree. No acute or suspicious osseous abnormality. (Lucent lesion within the medial aspect of the LEFT iliac bone, of uncertain significance, possibly a small benign bone island or degenerative subchondral cyst. No additional similar-appearing lesions within the axial skeleton or osseous pelvis. IMPRESSION: 1. 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 Differential for peritoneal carcinomatosis is primarily neoplastic and includes cancers of the ovary, appendix, colon, pancreas and stomach. PET-CT may be helpful for further characterization. Tissue sampling of the mesentery may be eventually required for diagnosis. 2. Mildly distended small bowel  loops throughout the abdomen and pelvis, with fluid and associated air-fluid levels throughout the nondistended small bowel, suggesting a mild ileus versus partial small bowel obstruction. Favor partial small bowel obstruction with transition zone in the RIGHT lower quadrant, likely related to aforementioned neoplastic peritoneal implants and/or adhesions. 3. Small free fluid within the RIGHT upper quadrant and LEFT upper quadrant. No abscess collection seen. No free intraperitoneal air seen. 4. Extensive colonic diverticulosis without evidence of acute diverticulitis. 5. Small chronic pleural effusions with associated atelectasis. Aortic Atherosclerosis (ICD10-I70.0). These results were called by telephone at the time of interpretation on 12/28/2020 at 12:27 pm to provider [facility] , who verbally acknowledged these results. Electronically Signed   By: Franki Cabot M.D.   On: 12/28/2020 12:28   CT ABDOMEN PELVIS W CONTRAST  Result Date: 12/28/2020 CLINICAL DATA:  Acute abdominal pain.  Shortness of breath. EXAM: CT CHEST, ABDOMEN, AND PELVIS WITH CONTRAST TECHNIQUE: Multidetector CT imaging of the chest, abdomen and pelvis was performed following the standard protocol during bolus administration of intravenous contrast. CONTRAST:  56mL OMNIPAQUE IOHEXOL 300 MG/ML  SOLN COMPARISON:  Chest CT dated 04/27/2020. CT abdomen dated 05/21/2020. FINDINGS: CT CHEST FINDINGS Cardiovascular: No thoracic aortic aneurysm. No pericardial effusion. Scattered coronary artery calcifications. Mild aortic atherosclerosis. 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 Mediastinum/Nodes: No mass or enlarged lymph nodes within the mediastinum or perihilar regions. Esophagus appears normal. Trachea and central bronchi are unremarkable. Lungs/Pleura: Small chronic pleural effusions with associated atelectasis. Lungs otherwise clear. No pneumothorax. Musculoskeletal: Mild  degenerative spondylosis of the kyphotic thoracic spine. No acute-appearing osseous abnormality.  CT ABDOMEN PELVIS FINDINGS Hepatobiliary: No focal liver abnormality is seen. Gallbladder is distended but otherwise unremarkable. No bile duct dilatation is seen. Pancreas: Unremarkable. No pancreatic ductal dilatation or surrounding inflammatory changes. Spleen: Normal in size without focal abnormality. Adrenals/Urinary Tract: Adrenal glands appear normal. Kidneys are unremarkable without suspicious mass, stone or hydronephrosis. No obstructing ureteral stone. Bladder is unremarkable, partially decompressed. Stomach/Bowel: No dilated large or small bowel loops. Extensive diverticulosis of the transverse, descending and sigmoid colon but no focal inflammatory change to suggest acute diverticulitis. Mildly distended small bowel loops throughout the abdomen and pelvis, with fluid and associated air-fluid levels throughout. Stomach is unremarkable, partially decompressed. Vascular/Lymphatic: No acute-appearing vascular abnormality. Mild aortic atherosclerosis. No enlarged lymph nodes are seen in the abdomen or pelvis. Reproductive: Uterus and bilateral adnexa are unremarkable. Other: Fluid stranding and nodular thickening of the anterior mesentery and central mesentery, most suggestive of omental caking suggesting neoplastic process (peritoneal carcinomatosis. Small amount of free fluid adjacent to the liver and spleen. No circumscribed fluid collection or abscess-like collection is identified. Musculoskeletal: Degenerative spondylosis of the lumbar spine, moderate in degree. No acute or suspicious osseous abnormality. (Lucent lesion within the medial aspect of the LEFT iliac bone, of uncertain significance, possibly a small benign bone island or degenerative subchondral cyst. No additional similar-appearing lesions within the axial skeleton or osseous pelvis. IMPRESSION: 1. Widespread nodular thickening of the anterior  mesentery and central mesentery, with mild fluid stranding, most suggestive of omental caking related to neoplastic process (peritoneal carcinomatosis). Differential for peritoneal carcinomatosis is primarily neoplastic and includes cancers of the ovary, appendix, colon, pancreas and stomach. PET-CT may be helpful for further characterization. Tissue sampling of the mesentery may be eventually required for diagnosis. 2. Mildly distended small bowel loops throughout the abdomen and pelvis, with fluid and associated air-fluid levels throughout the nondistended small bowel, suggesting a mild ileus versus partial small bowel obstruction. Favor partial small bowel obstruction with transition zone in the RIGHT lower quadrant, likely related to aforementioned neoplastic peritoneal implants and/or adhesions. 3. Small free fluid within the RIGHT upper quadrant and LEFT upper quadrant. No abscess collection seen. No free intraperitoneal air seen. 4. Extensive colonic diverticulosis without evidence of acute diverticulitis. 5. Small chronic pleural effusions with associated atelectasis. Aortic Atherosclerosis (ICD10-I70.0). These results were called by telephone at the time of interpretation on 12/28/2020 at 12:27 pm to provider [facility] , who verbally acknowledged these results. Electronically Signed   By: Franki Cabot M.D.   On: 12/28/2020 12:28   DG Chest Portable 1 View  Result Date: 12/28/2020 CLINICAL DATA:  Severe abdominal pain. Clinical concern for free peritoneal air. EXAM: PORTABLE CHEST 1 VIEW COMPARISON:  05/21/2020 FINDINGS: Normal sized heart. Clear lungs. No free peritoneal air seen. Thoracic spine degenerative changes. IMPRESSION: No free peritoneal air or other acute abnormality. Electronically Signed   By: Claudie Revering M.D.   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 Colonic/appendiceal are less likely given that she had a normal colonoscopy 2 years ago. 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

## 2020-12-29 NOTE — Progress Notes (Signed)
PROGRESS NOTE    Brittany Middleton  OZH:[id number] DOB: 1951/06/10 DOA: 12/28/2020 PCP: [name], MD   Brief Narrative:  HPI: Brittany Middleton is a 70 y.o. female with medical history significant of HTN, HLD, chronic interstitial cystitis. Presenting with abdominal pain. Her symptoms start 2 weeks ago. She had abdominal spasm across the top of her abdomen that would come on strong and then release on its own. She tried adjusting her diet. She ate yogurt and toast, but after a few days, she didn't want to eat at all. She started having more intense pain: "like I was having a baby". She spoke with her GI doc who prescribed some unspecified medications. However, they didn't help. She began to feel more bloated and nauseous. Last night her bloating increased in intensity. She was able to have a small BM , but it didn't provide full relief. She decided to come to the ED for help. She denies any other aggravating or alleviating factors.    ED Course: CT showed a thickening of the omentum that was concerning for peritoneal carcinomatosis. CT was also concerning for SBO. [facility] was consulted. [facility] was called for admission.  Assessment & Plan:   Active Problems:   SBO (small bowel obstruction) (HCC)   Partial SBO: Patient feeling better and passing flatus but no bowel movement.  No nausea.  Refused NG tube yesterday.  Surgery following and managing.  Defer to them.   Peritoneal Carcinomatosis: Multiple tumor markers sent.  Will eventually need diagnostic laparoscopy with biopsy.  Patient this morning had expressed her desire to be transferred to [facility] and have all the procedures there as she has her PCP at [facility] and prefers continuity.  She was informed that the chances of hospital hospital transfer are very slim as they are often at full capacity with no beds available.  Patient brought up the idea of being discharged from here and either going to the ED at [facility] or to go see her PCP.  She was  advised to consider having the biopsy here.  Later on, I received a message from the nurse that patient has changed her mind and would like to have the biopsy here but would like to initiate any oncology care at [facility].  Per general surgery, they would like to wait for 5 days since her Plavix was held.  Will defer to general surgery for timing of the procedure.  Hypokalemia: Resolved.   Essential HTN: Blood pressure fairly controlled.  Continue to hold oral antihypertensives and continue as needed IV hydralazine in the meantime.  GERD: Continue Protonix.   HLD: resume home statin once off NPO status   Hx of coronary artery aneurysm: Plavix on hold.  Chronic interstitial cystitis: Patient has no UTI symptoms and she is afebrile with no leukocytosis.  For some reason UA and urine culture has been sent.  UA showing positive nitrites and leukoesterase and bacteria does not indicate UTI in the face of patient being asymptomatic and no other signs of infection.  Will monitor.   Morbid obesity: Weight loss counseling provided.  DVT prophylaxis:    Code Status: Full Code  Family Communication: Son present at bedside.  Plan of care discussed with patient in length and he verbalized understanding and agreed with it.  Status is: Inpatient  Remains inpatient appropriate because:Ongoing diagnostic testing needed not appropriate for outpatient work up  Dispo: The patient is from: Home  Anticipated d/c is to: Home              Patient currently is not medically stable to d/c.   Difficult to place patient No        Estimated body mass index is 43.27 kg/m as calculated from the following:   Height as of this encounter: 5\' 5"  (1.651 m).   Weight as of this encounter: 117.9 kg.      Nutritional status:               Consultants:  General surgery  Procedures:  None  Antimicrobials:  Anti-infectives (From admission, onward)    None           Subjective: Patient seen and examined.  She feels much better.  Very minimal abdominal pain.  No nausea.  Passing flatus.  No BM.  Objective: Vitals:   12/28/20 2140 12/29/20 0130 12/29/20 0526 12/29/20 0953  BP: (!) 110/45 (!) 135/57 (!) 142/65 (!) 149/63  Pulse: 61 (!) 56 (!) 55 62  Resp: 16 15 16 16   Temp: 98.4 F (36.9 C) 98.1 F (36.7 C) 98.2 F (36.8 C) 99.2 F (37.3 C)  TempSrc: Oral Oral Oral Oral  SpO2: 93% 93% 94% 94%  Weight:      Height:        Intake/Output Summary (Last 24 hours) at 12/29/2020 1043 Last data filed at 12/29/2020 0526 Gross per 24 hour  Intake 139.24 ml  Output --  Net 139.24 ml   Filed Weights   12/28/20 0938  Weight: 117.9 kg    Examination:  General exam: Appears calm and comfortable  Respiratory system: Clear to auscultation. Respiratory effort normal. Cardiovascular system: S1 & S2 heard, RRR. No JVD, murmurs, rubs, gallops or clicks. No pedal edema. Gastrointestinal system: Abdomen is nondistended, soft and minimal right lower quadrant and periumbilical tenderness, no organomegaly or masses felt. Normal bowel sounds heard. Central nervous system: Alert and oriented. No focal neurological deficits. Extremities: Symmetric 5 x 5 power. Skin: No rashes, lesions or ulcers Psychiatry: Judgement and insight appear normal. Mood & affect appropriate.    Data Reviewed: I have personally reviewed following labs and imaging studies  CBC: Recent Labs  Lab 12/28/20 1057 12/29/20 0323  WBC 5.5 3.2*  NEUTROABS 3.9  --   HGB 14.2 12.4  HCT 44.1 38.6  MCV 88.2 89.6  PLT 274 154   Basic Metabolic Panel: Recent Labs  Lab 12/28/20 1057 12/28/20 1549 12/29/20 0323  NA 138  --  139  K 3.4*  --  3.7  CL 100  --  106  CO2 30  --  27  GLUCOSE 115*  --  97  BUN 8  --  9  CREATININE 0.67  --  0.58  CALCIUM 9.1  --  8.3*  MG  --  2.0  --    GFR: Estimated Creatinine Clearance: 86.5 mL/min (by C-G formula based on SCr of 0.58  mg/dL). Liver Function Tests: Recent Labs  Lab 12/28/20 1057 12/29/20 0323  AST 24 23  ALT 20 16  ALKPHOS 55 42  BILITOT 1.1 0.9  PROT 7.6 6.0*  ALBUMIN 4.3 3.3*   Recent Labs  Lab 12/28/20 1057  LIPASE 30   No results for input(s): AMMONIA in the last 168 hours. Coagulation Profile: No results for input(s): INR, PROTIME in the last 168 hours. Cardiac Enzymes: No results for input(s): CKTOTAL, CKMB, CKMBINDEX, TROPONINI in the last 168 hours. BNP (last 3 results) No  results for input(s): PROBNP in the last 8760 hours. HbA1C: No results for input(s): HGBA1C in the last 72 hours. CBG: No results for input(s): GLUCAP in the last 168 hours. Lipid Profile: No results for input(s): CHOL, HDL, LDLCALC, TRIG, CHOLHDL, LDLDIRECT in the last 72 hours. Thyroid Function Tests: No results for input(s): TSH, T4TOTAL, FREET4, T3FREE, THYROIDAB in the last 72 hours. Anemia Panel: No results for input(s): VITAMINB12, FOLATE, FERRITIN, TIBC, IRON, RETICCTPCT in the last 72 hours. Sepsis Labs: Recent Labs  Lab 12/28/20 1057  LATICACIDVEN 1.1    Recent Results (from the past 240 hour(s))  Urine Culture     Status: Abnormal (Preliminary result)   Collection Time: 12/28/20 10:57 AM   Specimen: Urine, Random  Result Value Ref Range Status   Specimen Description   Final    URINE, RANDOM Performed at [facility] [address] [place], [place] [address]    Special Requests   Final    NONE Performed at [facility], [place] [address] [place], [place] [address]    Culture >=100,000 COLONIES/mL GRAM NEGATIVE RODS (A)  Final   Report Status PENDING  Incomplete  Resp Panel by RT-PCR (Flu A&B, Covid) Nasopharyngeal Swab     Status: None   Collection Time: 12/28/20  1:26 PM   Specimen: Nasopharyngeal Swab; Nasopharyngeal(NP) swabs in vial transport medium  Result Value Ref Range Status   SARS Coronavirus 2 by RT PCR NEGATIVE NEGATIVE Final     Comment: (NOTE) SARS-CoV-2 target nucleic acids are NOT DETECTED.  The SARS-CoV-2 RNA is generally detectable in upper respiratory specimens during the acute phase of infection. The lowest concentration of SARS-CoV-2 viral copies this assay can detect is 138 copies/mL. A negative result does not preclude SARS-Cov-2 infection and should not be used as the sole basis for treatment or other patient management decisions. A negative result may occur with  improper specimen collection/handling, submission of specimen other than nasopharyngeal swab, presence of viral mutation(s) within the areas targeted by this assay, and inadequate number of viral copies(<138 copies/mL). A negative result must be combined with clinical observations, patient history, and epidemiological information. The expected result is Negative.  Fact Sheet for Patients:  EntrepreneurPulse.com.au  Fact Sheet for Healthcare Providers:  IncredibleEmployment.be  This test is no t yet approved or cleared by the [place] FDA and  has been authorized for detection and/or diagnosis of SARS-CoV-2 by FDA under an Emergency Use Authorization (EUA). This EUA will remain  in effect (meaning this test can be used) for the duration of the COVID-19 declaration under Section 564(b)(1) of the Act, 21 U.S.C.section 360bbb-3(b)(1), unless the authorization is terminated  or revoked sooner.       Influenza A by PCR NEGATIVE NEGATIVE Final   Influenza B by PCR NEGATIVE NEGATIVE Final    Comment: (NOTE) The Xpert Xpress SARS-CoV-2/FLU/RSV plus assay is intended as an aid in the diagnosis of influenza from Nasopharyngeal swab specimens and should not be used as a sole basis for treatment. Nasal washings and aspirates are unacceptable for Xpert Xpress SARS-CoV-2/FLU/RSV testing.  Fact Sheet for Patients: EntrepreneurPulse.com.au  Fact Sheet for Healthcare  Providers: IncredibleEmployment.be  This test is not yet approved or cleared by the [place] FDA and has been authorized for detection and/or diagnosis of SARS-CoV-2 by FDA under an Emergency Use Authorization (EUA). This EUA will remain in effect (meaning this test can be used) for the duration of the COVID-19 declaration under Section 564(b)(1) of  the Act, 21 U.S.C. section 360bbb-3(b)(1), unless the authorization is terminated or revoked.  Performed at [facility], [place] [address] [place], [place] [address]       Radiology Studies: CT CHEST WO CONTRAST  Result Date: 12/28/2020 CLINICAL DATA:  Acute abdominal pain.  Shortness of breath. EXAM: CT CHEST, ABDOMEN, AND PELVIS WITH CONTRAST TECHNIQUE: Multidetector CT imaging of the chest, abdomen and pelvis was performed following the standard protocol during bolus administration of intravenous contrast. CONTRAST:  69mL OMNIPAQUE IOHEXOL 300 MG/ML  SOLN COMPARISON:  Chest CT dated 04/27/2020. CT abdomen dated 05/21/2020. FINDINGS: CT CHEST FINDINGS Cardiovascular: No thoracic aortic aneurysm. No pericardial effusion. Scattered coronary artery calcifications. Mild aortic atherosclerosis. 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 Mediastinum/Nodes: No mass or enlarged lymph nodes within the mediastinum or perihilar regions. Esophagus appears normal. Trachea and central bronchi are unremarkable. Lungs/Pleura: Small chronic pleural effusions with associated atelectasis. Lungs otherwise clear. No pneumothorax. Musculoskeletal: Mild degenerative spondylosis of the kyphotic thoracic spine. No acute-appearing osseous abnormality. CT ABDOMEN PELVIS FINDINGS Hepatobiliary: No focal liver abnormality is seen. Gallbladder is distended but otherwise unremarkable. No bile duct dilatation is seen. Pancreas: Unremarkable. No pancreatic ductal dilatation or surrounding  inflammatory changes. Spleen: Normal in size without focal abnormality. Adrenals/Urinary Tract: Adrenal glands appear normal. Kidneys are unremarkable without suspicious mass, stone or hydronephrosis. No obstructing ureteral stone. Bladder is unremarkable, partially decompressed. Stomach/Bowel: No dilated large or small bowel loops. Extensive diverticulosis of the transverse, descending and sigmoid colon but no focal inflammatory change to suggest acute diverticulitis. Mildly distended small bowel loops throughout the abdomen and pelvis, with fluid and associated air-fluid levels throughout. Stomach is unremarkable, partially decompressed. Vascular/Lymphatic: No acute-appearing vascular abnormality. Mild aortic atherosclerosis. No enlarged lymph nodes are seen in the abdomen or pelvis. Reproductive: Uterus and bilateral adnexa are unremarkable. Other: Fluid stranding and nodular thickening of the anterior mesentery and central mesentery, most suggestive of omental caking suggesting neoplastic process (peritoneal carcinomatosis. Small amount of free fluid adjacent to the liver and spleen. No circumscribed fluid collection or abscess-like collection is identified. Musculoskeletal: Degenerative spondylosis of the lumbar spine, moderate in degree. No acute or suspicious osseous abnormality. (Lucent lesion within the medial aspect of the LEFT iliac bone, of uncertain significance, possibly a small benign bone island or degenerative subchondral cyst. No additional similar-appearing lesions within the axial skeleton or osseous pelvis. IMPRESSION: 1. 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 Differential for peritoneal carcinomatosis is primarily neoplastic and includes cancers of the ovary, appendix, colon, pancreas and stomach. PET-CT may be helpful for further  characterization. Tissue sampling of the mesentery may be eventually required for diagnosis. 2. Mildly distended small bowel loops throughout the abdomen and pelvis, with fluid and associated air-fluid levels throughout the nondistended small bowel, suggesting a mild ileus versus partial small bowel obstruction. Favor partial small bowel obstruction with transition zone in the RIGHT lower quadrant, likely related to aforementioned neoplastic peritoneal implants and/or adhesions. 3. Small free fluid within the RIGHT upper quadrant and LEFT upper quadrant. No abscess collection seen. No free intraperitoneal air seen. 4. Extensive colonic diverticulosis without evidence of acute diverticulitis. 5. Small chronic pleural effusions with associated atelectasis. Aortic Atherosclerosis (ICD10-I70.0). These results were called by telephone at the time of interpretation on 12/28/2020 at 12:27 pm to provider [facility] [place] , who verbally acknowledged these results. Electronically  Signed   By: Franki Cabot M.D.   On: 12/28/2020 12:28   CT ABDOMEN PELVIS W CONTRAST  Result Date: 12/28/2020 CLINICAL DATA:  Acute abdominal pain.  Shortness of breath. EXAM: CT CHEST, ABDOMEN, AND PELVIS WITH CONTRAST TECHNIQUE: Multidetector CT imaging of the chest, abdomen and pelvis was performed following the standard protocol during bolus administration of intravenous contrast. CONTRAST:  76mL OMNIPAQUE IOHEXOL 300 MG/ML  SOLN COMPARISON:  Chest CT dated 04/27/2020. CT abdomen dated 05/21/2020. FINDINGS: CT CHEST FINDINGS Cardiovascular: No thoracic aortic aneurysm. No pericardial effusion. Scattered coronary artery calcifications. Mild aortic atherosclerosis. 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 Mediastinum/Nodes: No mass or enlarged lymph nodes within the mediastinum or perihilar regions. Esophagus appears normal. Trachea and central bronchi are unremarkable. Lungs/Pleura: Small  chronic pleural effusions with associated atelectasis. Lungs otherwise clear. No pneumothorax. Musculoskeletal: Mild degenerative spondylosis of the kyphotic thoracic spine. No acute-appearing osseous abnormality. CT ABDOMEN PELVIS FINDINGS Hepatobiliary: No focal liver abnormality is seen. Gallbladder is distended but otherwise unremarkable. No bile duct dilatation is seen. Pancreas: Unremarkable. No pancreatic ductal dilatation or surrounding inflammatory changes. Spleen: Normal in size without focal abnormality. Adrenals/Urinary Tract: Adrenal glands appear normal. Kidneys are unremarkable without suspicious mass, stone or hydronephrosis. No obstructing ureteral stone. Bladder is unremarkable, partially decompressed. Stomach/Bowel: No dilated large or small bowel loops. Extensive diverticulosis of the transverse, descending and sigmoid colon but no focal inflammatory change to suggest acute diverticulitis. Mildly distended small bowel loops throughout the abdomen and pelvis, with fluid and associated air-fluid levels throughout. Stomach is unremarkable, partially decompressed. Vascular/Lymphatic: No acute-appearing vascular abnormality. Mild aortic atherosclerosis. No enlarged lymph nodes are seen in the abdomen or pelvis. Reproductive: Uterus and bilateral adnexa are unremarkable. Other: Fluid stranding and nodular thickening of the anterior mesentery and central mesentery, most suggestive of omental caking suggesting neoplastic process (peritoneal carcinomatosis. Small amount of free fluid adjacent to the liver and spleen. No circumscribed fluid collection or abscess-like collection is identified. Musculoskeletal: Degenerative spondylosis of the lumbar spine, moderate in degree. No acute or suspicious osseous abnormality. (Lucent lesion within the medial aspect of the LEFT iliac bone, of uncertain significance, possibly a small benign bone island or degenerative subchondral cyst. No additional similar-appearing  lesions within the axial skeleton or osseous pelvis. IMPRESSION: 1. 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 Differential for peritoneal carcinomatosis is primarily neoplastic and includes cancers of the ovary, appendix, colon, pancreas and stomach. PET-CT may be helpful for further characterization. Tissue sampling of the mesentery may be eventually required for diagnosis. 2. Mildly distended small bowel loops throughout the abdomen and pelvis, with fluid and associated air-fluid levels throughout the nondistended small bowel, suggesting a mild ileus versus partial small bowel obstruction. Favor partial small bowel obstruction with transition zone in the RIGHT lower quadrant, likely related to aforementioned neoplastic peritoneal implants and/or adhesions. 3. Small free fluid within the RIGHT upper quadrant and LEFT upper quadrant. No abscess collection seen. No free intraperitoneal air seen. 4. Extensive colonic diverticulosis without evidence of acute diverticulitis. 5. Small chronic pleural effusions with associated atelectasis. Aortic Atherosclerosis (ICD10-I70.0). These results were called by telephone at the time of interpretation on 12/28/2020 at 12:27 pm to provider [facility] , who verbally acknowledged these results. Electronically Signed   By: Franki Cabot M.D.   On: 12/28/2020 12:28   DG Chest Portable 1  View  Result Date: 12/28/2020 CLINICAL DATA:  Severe abdominal pain. Clinical concern for free peritoneal air. EXAM: PORTABLE CHEST 1 VIEW COMPARISON:  05/21/2020 FINDINGS: Normal sized heart. Clear lungs. No free peritoneal air seen. Thoracic spine degenerative changes. IMPRESSION: No free peritoneal air or other acute abnormality. Electronically Signed   By: Claudie Revering M.D.   On: 12/28/2020 11:58    Scheduled Meds:  budesonide  0.25  mg Nebulization BID   enoxaparin (LOVENOX) injection  60 mg Subcutaneous Q24H   pantoprazole (PROTONIX) IV  40 mg Intravenous Q24H   Continuous Infusions:  potassium chloride       LOS: 1 day   Time spent: 39 minutes   Darliss Cheney, MD [facility]  12/29/2020, 10:43 AM   How to contact the [facility] Attending or Consulting provider [place] or covering provider during after hours [place], for this patient?  Check the care team in [facility] and look for a) attending/consulting [facility] provider listed and b) the [facility] team listed. Page or secure chat [phone]. Log into [email or web address] and use [place]'s universal password to access. If you do not have the password, please contact the hospital operator. Locate the [facility] provider you are looking for under [facility] and page to a number that you can be directly reached. If you still have difficulty reaching the provider, please page the [facility] (Director on Call) for the Hospitalists listed on amion for assistance.

## 2020-12-30 ENCOUNTER — Inpatient Hospital Stay (HOSPITAL_COMMUNITY): Payer: Medicare Other | Admitting: Anesthesiology

## 2020-12-30 ENCOUNTER — Encounter (HOSPITAL_COMMUNITY): Payer: Self-pay | Admitting: Internal Medicine

## 2020-12-30 ENCOUNTER — Encounter (HOSPITAL_COMMUNITY)
Admission: EM | Disposition: A | Discharge status: Home / Self Care | Payer: Self-pay | Source: Home / Self Care | Attending: Family Medicine

## 2020-12-30 DIAGNOSIS — C762 Malignant neoplasm of abdomen: Secondary | ICD-10-CM

## 2020-12-30 HISTORY — DX: Malignant neoplasm of abdomen: C76.2

## 2020-12-30 HISTORY — PX: LAPAROSCOPY: SHX197

## 2020-12-30 LAB — URINE CULTURE: Culture: >=100000 — AB

## 2020-12-30 LAB — CBC WITH DIFFERENTIAL/PLATELET
Abs Immature Granulocytes: 0.01 10*3/uL (ref 0.00–0.07)
Basophils Absolute: 0 10*3/uL (ref 0.0–0.1)
Basophils Relative: 1 %
Eosinophils Absolute: 0.2 10*3/uL (ref 0.0–0.5)
Eosinophils Relative: 4 %
HCT: 39.6 % (ref 36.0–46.0)
Hemoglobin: 12.4 g/dL (ref 12.0–15.0)
Immature Granulocytes: 0 %
Lymphocytes Relative: 24 %
Lymphs Abs: 1 10*3/uL (ref 0.7–4.0)
MCH: 28 pg (ref 26.0–34.0)
MCHC: 31.3 g/dL (ref 30.0–36.0)
MCV: 89.4 fL (ref 80.0–100.0)
Monocytes Absolute: 0.4 10*3/uL (ref 0.1–1.0)
Monocytes Relative: 10 %
Neutro Abs: 2.6 10*3/uL (ref 1.7–7.7)
Neutrophils Relative %: 61 %
Platelets: 239 10*3/uL (ref 150–400)
RBC: 4.43 MIL/uL (ref 3.87–5.11)
RDW: 14 % (ref 11.5–15.5)
WBC: 4.2 10*3/uL (ref 4.0–10.5)
nRBC: 0 % (ref 0.0–0.2)

## 2020-12-30 LAB — BASIC METABOLIC PANEL
Anion gap: 9 (ref 5–15)
BUN: 9 mg/dL (ref 8–23)
CO2: 25 mmol/L (ref 22–32)
Calcium: 8.3 mg/dL — ABNORMAL LOW (ref 8.9–10.3)
Chloride: 108 mmol/L (ref 98–111)
Creatinine, Ser: 0.55 mg/dL (ref 0.44–1.00)
GFR, Estimated: >60 mL/min (ref >60)
Glucose, Bld: 84 mg/dL (ref 70–99)
Potassium: 3.3 mmol/L — ABNORMAL LOW (ref 3.5–5.1)
Sodium: 142 mmol/L (ref 135–145)

## 2020-12-30 LAB — SURGICAL PCR SCREEN
MRSA, PCR: NEGATIVE
Staphylococcus aureus: NEGATIVE

## 2020-12-30 LAB — CEA: CEA: 0.4 ng/mL (ref 0.0–4.7)

## 2020-12-30 LAB — CA 125: Cancer Antigen (CA) 125: 95.1 U/mL — ABNORMAL HIGH (ref 0.0–38.1)

## 2020-12-30 LAB — CANCER ANTIGEN 19-9: CA 19-9: 4 U/mL (ref 0–35)

## 2020-12-30 SURGERY — LAPAROSCOPY, DIAGNOSTIC
Anesthesia: General

## 2020-12-30 MED ORDER — LIDOCAINE HCL 2 % IJ SOLN
INTRAMUSCULAR | Status: AC
  Filled 2020-12-30: qty 20

## 2020-12-30 MED ORDER — DEXAMETHASONE SODIUM PHOSPHATE 10 MG/ML IJ SOLN
INTRAMUSCULAR | Status: AC
  Filled 2020-12-30: qty 1

## 2020-12-30 MED ORDER — PROPOFOL 10 MG/ML IV BOLUS
INTRAVENOUS | Status: DC | PRN
  Administered 2020-12-30: 200 mg via INTRAVENOUS

## 2020-12-30 MED ORDER — ACETAMINOPHEN 10 MG/ML IV SOLN
1000.0000 mg | Freq: Once | INTRAVENOUS | Status: AC
  Administered 2020-12-30: 1000 mg via INTRAVENOUS

## 2020-12-30 MED ORDER — FENTANYL CITRATE (PF) 100 MCG/2ML IJ SOLN
25.0000 ug | INTRAMUSCULAR | Status: DC | PRN
  Administered 2020-12-30: 25 ug via INTRAVENOUS
  Administered 2020-12-30 (×2): 50 ug via INTRAVENOUS
  Administered 2020-12-30: 25 ug via INTRAVENOUS

## 2020-12-30 MED ORDER — OXYCODONE HCL 5 MG PO TABS
5.0000 mg | ORAL_TABLET | Freq: Once | ORAL | Status: AC | PRN
  Administered 2020-12-30: 5 mg via ORAL

## 2020-12-30 MED ORDER — ROCURONIUM BROMIDE 10 MG/ML (PF) SYRINGE
PREFILLED_SYRINGE | INTRAVENOUS | Status: DC | PRN
  Administered 2020-12-30: 50 mg via INTRAVENOUS

## 2020-12-30 MED ORDER — FENTANYL CITRATE (PF) 100 MCG/2ML IJ SOLN
INTRAMUSCULAR | Status: DC | PRN
  Administered 2020-12-30: 150 ug via INTRAVENOUS
  Administered 2020-12-30 (×2): 50 ug via INTRAVENOUS

## 2020-12-30 MED ORDER — POTASSIUM CHLORIDE 10 MEQ/100ML IV SOLN
10.0000 meq | INTRAVENOUS | Status: AC
  Administered 2020-12-30 (×3): 10 meq via INTRAVENOUS
  Filled 2020-12-30 (×4): qty 100

## 2020-12-30 MED ORDER — HYDROMORPHONE HCL 1 MG/ML IJ SOLN
0.5000 mg | INTRAMUSCULAR | Status: DC
  Administered 2020-12-30: 0.5 mg via INTRAVENOUS

## 2020-12-30 MED ORDER — ACETAMINOPHEN 10 MG/ML IV SOLN
INTRAVENOUS | Status: AC
  Administered 2020-12-30: 1000 mg
  Filled 2020-12-30: qty 100

## 2020-12-30 MED ORDER — LIP MEDEX EX OINT
1.0000 "application " | TOPICAL_OINTMENT | CUTANEOUS | Status: DC | PRN
  Administered 2020-12-30: 1 via TOPICAL
  Filled 2020-12-30: qty 7

## 2020-12-30 MED ORDER — OXYCODONE HCL 5 MG/5ML PO SOLN
5.0000 mg | Freq: Once | ORAL | Status: AC | PRN
Start: 2020-12-30 — End: 2020-12-30

## 2020-12-30 MED ORDER — ENOXAPARIN SODIUM 60 MG/0.6ML IJ SOSY
60.0000 mg | PREFILLED_SYRINGE | INTRAMUSCULAR | Status: DC
  Administered 2020-12-31 – 2021-01-01 (×2): 60 mg via SUBCUTANEOUS
  Filled 2020-12-30 (×2): qty 0.6

## 2020-12-30 MED ORDER — SUCCINYLCHOLINE CHLORIDE 200 MG/10ML IV SOSY
PREFILLED_SYRINGE | INTRAVENOUS | Status: DC | PRN
  Administered 2020-12-30: 140 mg via INTRAVENOUS

## 2020-12-30 MED ORDER — KETAMINE HCL 10 MG/ML IJ SOLN
INTRAMUSCULAR | Status: DC | PRN
  Administered 2020-12-30: 30 mg via INTRAVENOUS

## 2020-12-30 MED ORDER — PROPOFOL 10 MG/ML IV BOLUS
INTRAVENOUS | Status: AC
  Filled 2020-12-30: qty 20

## 2020-12-30 MED ORDER — SUCCINYLCHOLINE CHLORIDE 200 MG/10ML IV SOSY
PREFILLED_SYRINGE | INTRAVENOUS | Status: AC
  Filled 2020-12-30: qty 10

## 2020-12-30 MED ORDER — HYDROMORPHONE HCL 1 MG/ML IJ SOLN
0.2500 mg | INTRAMUSCULAR | Status: DC | PRN
Start: 2020-12-30 — End: 2020-12-30
  Administered 2020-12-30 (×4): 0.5 mg via INTRAVENOUS

## 2020-12-30 MED ORDER — SUGAMMADEX SODIUM 500 MG/5ML IV SOLN
INTRAVENOUS | Status: DC | PRN
  Administered 2020-12-30: 400 mg via INTRAVENOUS

## 2020-12-30 MED ORDER — HYDROMORPHONE HCL 1 MG/ML IJ SOLN
INTRAMUSCULAR | Status: AC
  Filled 2020-12-30: qty 1

## 2020-12-30 MED ORDER — BUPIVACAINE-EPINEPHRINE 0.25% -1:200000 IJ SOLN
INTRAMUSCULAR | Status: DC | PRN
  Administered 2020-12-30: 20 mL

## 2020-12-30 MED ORDER — ROCURONIUM BROMIDE 10 MG/ML (PF) SYRINGE
PREFILLED_SYRINGE | INTRAVENOUS | Status: AC
  Filled 2020-12-30: qty 10

## 2020-12-30 MED ORDER — MIDAZOLAM HCL 2 MG/2ML IJ SOLN
INTRAMUSCULAR | Status: AC
  Filled 2020-12-30: qty 2

## 2020-12-30 MED ORDER — LIDOCAINE 2% (20 MG/ML) 5 ML SYRINGE
INTRAMUSCULAR | Status: AC
  Filled 2020-12-30: qty 5

## 2020-12-30 MED ORDER — OXYCODONE HCL 5 MG PO TABS
ORAL_TABLET | ORAL | Status: AC
  Filled 2020-12-30: qty 1

## 2020-12-30 MED ORDER — BUPIVACAINE-EPINEPHRINE (PF) 0.25% -1:200000 IJ SOLN
INTRAMUSCULAR | Status: AC
  Filled 2020-12-30: qty 30

## 2020-12-30 MED ORDER — SODIUM CHLORIDE 0.9 % IV SOLN
2.0000 g | Freq: Once | INTRAVENOUS | Status: AC
  Administered 2020-12-30: 2 g via INTRAVENOUS
  Filled 2020-12-30: qty 2

## 2020-12-30 MED ORDER — LIDOCAINE 2% (20 MG/ML) 5 ML SYRINGE
INTRAMUSCULAR | Status: DC | PRN
  Administered 2020-12-30: 80 mg via INTRAVENOUS

## 2020-12-30 MED ORDER — FENTANYL CITRATE (PF) 100 MCG/2ML IJ SOLN
INTRAMUSCULAR | Status: AC
  Filled 2020-12-30: qty 2

## 2020-12-30 MED ORDER — FENTANYL CITRATE (PF) 250 MCG/5ML IJ SOLN
INTRAMUSCULAR | Status: AC
  Filled 2020-12-30: qty 5

## 2020-12-30 MED ORDER — LACTATED RINGERS IV SOLN: INTRAVENOUS | Status: DC

## 2020-12-30 MED ORDER — LIDOCAINE 20MG/ML (2%) 15 ML SYRINGE OPTIME
INTRAMUSCULAR | Status: DC | PRN
  Administered 2020-12-30: 1.5 mg/kg/h via INTRAVENOUS

## 2020-12-30 MED ORDER — ONDANSETRON HCL 4 MG/2ML IJ SOLN
INTRAMUSCULAR | Status: DC | PRN
  Administered 2020-12-30: 4 mg via INTRAVENOUS

## 2020-12-30 MED ORDER — DEXAMETHASONE SODIUM PHOSPHATE 10 MG/ML IJ SOLN
INTRAMUSCULAR | Status: DC | PRN
  Administered 2020-12-30: 10 mg via INTRAVENOUS

## 2020-12-30 MED ORDER — ONDANSETRON HCL 4 MG/2ML IJ SOLN
INTRAMUSCULAR | Status: AC
  Filled 2020-12-30: qty 2

## 2020-12-30 MED ORDER — MIDAZOLAM HCL 5 MG/5ML IJ SOLN
INTRAMUSCULAR | Status: DC | PRN
  Administered 2020-12-30: 2 mg via INTRAVENOUS

## 2020-12-30 MED ORDER — ONDANSETRON HCL 4 MG/2ML IJ SOLN: 4.0000 mg | Freq: Once | INTRAMUSCULAR | Status: DC | PRN

## 2020-12-30 SURGICAL SUPPLY — 30 items
BAG COUNTER SPONGE SURGICOUNT (BAG) IMPLANT
COVER SURGICAL LIGHT HANDLE (MISCELLANEOUS) ×2 IMPLANT
DECANTER SPIKE VIAL GLASS SM (MISCELLANEOUS) IMPLANT
DERMABOND ADVANCED (GAUZE/BANDAGES/DRESSINGS) ×1
DERMABOND ADVANCED .7 DNX12 (GAUZE/BANDAGES/DRESSINGS) ×1 IMPLANT
ELECT REM PT RETURN 15FT ADLT (MISCELLANEOUS) ×2 IMPLANT
GLOVE SURG POLYISO LF SZ7 (GLOVE) ×4 IMPLANT
GLOVE SURG UNDER POLY LF SZ7 (GLOVE) ×2 IMPLANT
GOWN STRL REUS W/TWL LRG LVL3 (GOWN DISPOSABLE) ×2 IMPLANT
GOWN STRL REUS W/TWL XL LVL3 (GOWN DISPOSABLE) ×4 IMPLANT
IRRIG SUCT STRYKERFLOW 2 WTIP (MISCELLANEOUS)
IRRIGATION SUCT STRKRFLW 2 WTP (MISCELLANEOUS) IMPLANT
KIT BASIN OR (CUSTOM PROCEDURE TRAY) ×2 IMPLANT
KIT TURNOVER KIT A (KITS) ×2 IMPLANT
SHEARS HARMONIC ACE PLUS 36CM (ENDOMECHANICALS) IMPLANT
SHEARS HARMONIC ACE PLUS 45CM (MISCELLANEOUS) ×2 IMPLANT
SLEEVE XCEL OPT CAN 5 100 (ENDOMECHANICALS) ×2 IMPLANT
SUT MNCRL AB 4-0 PS2 18 (SUTURE) ×2 IMPLANT
SUT SILK 2 0 (SUTURE)
SUT SILK 2 0 SH CR/8 (SUTURE) IMPLANT
SUT SILK 2-0 18XBRD TIE 12 (SUTURE) IMPLANT
SUT SILK 3 0 (SUTURE)
SUT SILK 3 0 SH CR/8 (SUTURE) IMPLANT
SUT SILK 3-0 18XBRD TIE 12 (SUTURE) IMPLANT
TOWEL OR 17X26 10 PK STRL BLUE (TOWEL DISPOSABLE) ×2 IMPLANT
TOWEL OR NON WOVEN STRL DISP B (DISPOSABLE) ×2 IMPLANT
TRAY FOLEY MTR SLVR 16FR STAT (SET/KITS/TRAYS/PACK) ×2 IMPLANT
TRAY LAPAROSCOPIC (CUSTOM PROCEDURE TRAY) ×2 IMPLANT
TROCAR BLADELESS OPT 5 100 (ENDOMECHANICALS) ×2 IMPLANT
TROCAR XCEL 12X100 BLDLESS (ENDOMECHANICALS) IMPLANT

## 2020-12-30 NOTE — Op Note (Addendum)
Preoperative diagnosis: abdominal nodules  Postoperative diagnosis: same   Procedure: diagnostic laparoscopy with abdominal wall biopsy  Surgeon: Gurney Maxin, M.D.  Asst: none  Anesthesia: general  Indications for procedure: Brittany Middleton is a 70 y.o. year old female with symptoms of nausea, vomiting, and abdominal pain. Work up was concerning for cancer with abdominal wall studding.  Description of procedure: The patient was brought into the operative suite. Anesthesia was administered with General endotracheal anesthesia. WHO checklist was applied. The patient was then placed in supine position. The area was prepped and draped in the usual sterile fashion.  A small left subcostal incision was made. A 51mm trocar was used to gain access to the peritoneal cavity by optical entry technique. Pneumoperitoneum was applied with a high flow and low pressure. The laparoscope was reinserted to confirm position.  On initial visualization of the abdomen, there were multiple nodules throughout the abdominal wall. There were multiple nodules over the small intestine and mesentery. There was some loops of small intestine that were matted together. There was a large white mass in the left upper quadrant. Multiple areas of peritoneum were removed with harmonic scalpel. The large mass was inspected. It appeared to be related to the colon and omentum. Grasper was used to remove some superficial tissue and was sent for culture.  The abdomen was desufflated. The incisions were closed with 4-0 monocryl subcuticular stitch.   Findings: studding of the abdominal wall, mesentery and large LUQ mass  Specimen: 1. abdominal wall nodules 2. Pericolonic exudate  Implant: none   Blood loss: 5 ml  Local anesthesia:  20 ml marcaine   Complications: none  Gurney Maxin, M.D. General, Bariatric, & Minimally Invasive Surgery [facility]

## 2020-12-30 NOTE — Progress Notes (Signed)
Progress Note     Subjective: Patient reports some cramping pains, slightly worse with larger amounts of ice chips. Understands plans for OR today and agreeable to proceed.   Objective: Vital signs in last 24 hours: Temp:  [97.9 F (36.6 C)-98.1 F (36.7 C)] 97.9 F (36.6 C) (07/12 0540) Pulse Rate:  [55-65] 55 (07/12 0540) Resp:  [17-18] 17 (07/12 0540) BP: (142-163)/(65-71) 159/71 (07/12 0540) SpO2:  [94 %-96 %] 94 % (07/12 0540) Last BM Date: 12/25/20  Intake/Output from previous day: 07/11 0701 - 07/12 0700 In: 1644.3 [I.V.:1644.3] Out: -  Intake/Output this shift: No intake/output data recorded.  PE: General: pleasant, WD, obese female who is sitting up in chair  HEENT: sclera anicteric  Lungs: Respiratory effort nonlabored Abd: soft, mild generalized ttp, mild distention, BS hypoactive Psych: A&Ox3 with an appropriate affect.   Lab Results:  Recent Labs    12/29/20 0323 12/30/20 0318  WBC 3.2* 4.2  HGB 12.4 12.4  HCT 38.6 39.6  PLT 223 239   BMET Recent Labs    12/29/20 0323 12/30/20 0318  NA 139 142  K 3.7 3.3*  CL 106 108  CO2 27 25  GLUCOSE 97 84  BUN 9 9  CREATININE 0.58 0.55  CALCIUM 8.3* 8.3*   PT/INR No results for input(s): LABPROT, INR in the last 72 hours. CMP     Component Value Date/Time   NA 142 12/30/2020 0318   NA 143 12/20/2019 0829   K 3.3 (L) 12/30/2020 0318   CL 108 12/30/2020 0318   CO2 25 12/30/2020 0318   GLUCOSE 84 12/30/2020 0318   BUN 9 12/30/2020 0318   BUN 16 12/20/2019 0829   CREATININE 0.55 12/30/2020 0318   CALCIUM 8.3 (L) 12/30/2020 0318   PROT 6.0 (L) 12/29/2020 0323   PROT 6.4 02/04/2020 1036   ALBUMIN 3.3 (L) 12/29/2020 0323   ALBUMIN 4.2 02/04/2020 1036   AST 23 12/29/2020 0323   ALT 16 12/29/2020 0323   ALKPHOS 42 12/29/2020 0323   BILITOT 0.9 12/29/2020 0323   BILITOT 0.7 02/04/2020 1036   GFRNONAA >60 12/30/2020 0318   GFRAA 104 12/20/2019 0829   Lipase     Component Value Date/Time    LIPASE 30 12/28/2020 1057       Studies/Results: CT CHEST WO CONTRAST  Result Date: 12/28/2020 CLINICAL DATA:  Acute abdominal pain.  Shortness of breath. EXAM: CT CHEST, ABDOMEN, AND PELVIS WITH CONTRAST TECHNIQUE: Multidetector CT imaging of the chest, abdomen and pelvis was performed following the standard protocol during bolus administration of intravenous contrast. CONTRAST:  66mL OMNIPAQUE IOHEXOL 300 MG/ML  SOLN COMPARISON:  Chest CT dated 04/27/2020. CT abdomen dated 05/21/2020. FINDINGS: CT CHEST FINDINGS Cardiovascular: No thoracic aortic aneurysm. No pericardial effusion. Scattered coronary artery calcifications. Mild aortic atherosclerosis. 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 Mediastinum/Nodes: No mass or enlarged lymph nodes within the mediastinum or perihilar regions. Esophagus appears normal. Trachea and central bronchi are unremarkable. Lungs/Pleura: Small chronic pleural effusions with associated atelectasis. Lungs otherwise clear. No pneumothorax. Musculoskeletal: Mild degenerative spondylosis of the kyphotic thoracic spine. No acute-appearing osseous abnormality. CT ABDOMEN PELVIS FINDINGS Hepatobiliary: No focal liver abnormality is seen. Gallbladder is distended but otherwise unremarkable. No bile duct dilatation is seen. Pancreas: Unremarkable. No pancreatic ductal dilatation or surrounding inflammatory changes. Spleen: Normal in size without focal abnormality. Adrenals/Urinary Tract: Adrenal glands appear normal. Kidneys are unremarkable without suspicious mass, stone or hydronephrosis. No obstructing ureteral stone.  Bladder is unremarkable, partially decompressed. Stomach/Bowel: No dilated large or small bowel loops. Extensive diverticulosis of the transverse, descending and sigmoid colon but no focal inflammatory change to suggest acute diverticulitis. Mildly distended small bowel loops throughout the abdomen and pelvis,  with fluid and associated air-fluid levels throughout. Stomach is unremarkable, partially decompressed. Vascular/Lymphatic: No acute-appearing vascular abnormality. Mild aortic atherosclerosis. No enlarged lymph nodes are seen in the abdomen or pelvis. Reproductive: Uterus and bilateral adnexa are unremarkable. Other: Fluid stranding and nodular thickening of the anterior mesentery and central mesentery, most suggestive of omental caking suggesting neoplastic process (peritoneal carcinomatosis. Small amount of free fluid adjacent to the liver and spleen. No circumscribed fluid collection or abscess-like collection is identified. Musculoskeletal: Degenerative spondylosis of the lumbar spine, moderate in degree. No acute or suspicious osseous abnormality. (Lucent lesion within the medial aspect of the LEFT iliac bone, of uncertain significance, possibly a small benign bone island or degenerative subchondral cyst. No additional similar-appearing lesions within the axial skeleton or osseous pelvis. IMPRESSION: 1. 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 Differential for peritoneal carcinomatosis is primarily neoplastic and includes cancers of the ovary, appendix, colon, pancreas and stomach. PET-CT may be helpful for further characterization. Tissue sampling of the mesentery may be eventually required for diagnosis. 2. Mildly distended small bowel loops throughout the abdomen and pelvis, with fluid and associated air-fluid levels throughout the nondistended small bowel, suggesting a mild ileus versus partial small bowel obstruction. Favor partial small bowel obstruction with transition zone in the RIGHT lower quadrant, likely related to aforementioned neoplastic peritoneal implants and/or adhesions. 3. Small free fluid within the RIGHT upper quadrant and LEFT upper  quadrant. No abscess collection seen. No free intraperitoneal air seen. 4. Extensive colonic diverticulosis without evidence of acute diverticulitis. 5. Small chronic pleural effusions with associated atelectasis. Aortic Atherosclerosis (ICD10-I70.0). These results were called by telephone at the time of interpretation on 12/28/2020 at 12:27 pm to provider [facility] , who verbally acknowledged these results. Electronically Signed   By: Franki Cabot M.D.   On: 12/28/2020 12:28   CT ABDOMEN PELVIS W CONTRAST  Result Date: 12/28/2020 CLINICAL DATA:  Acute abdominal pain.  Shortness of breath. EXAM: CT CHEST, ABDOMEN, AND PELVIS WITH CONTRAST TECHNIQUE: Multidetector CT imaging of the chest, abdomen and pelvis was performed following the standard protocol during bolus administration of intravenous contrast. CONTRAST:  57mL OMNIPAQUE IOHEXOL 300 MG/ML  SOLN COMPARISON:  Chest CT dated 04/27/2020. CT abdomen dated 05/21/2020. FINDINGS: CT CHEST FINDINGS Cardiovascular: No thoracic aortic aneurysm. No pericardial effusion. Scattered coronary artery calcifications. Mild aortic atherosclerosis. 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 Mediastinum/Nodes: No mass or enlarged lymph nodes within the mediastinum or perihilar regions. Esophagus appears normal. Trachea and central bronchi are unremarkable. Lungs/Pleura: Small chronic pleural effusions with associated atelectasis. Lungs otherwise clear. No pneumothorax. Musculoskeletal: Mild degenerative spondylosis of the kyphotic thoracic spine. No acute-appearing osseous abnormality. CT ABDOMEN PELVIS FINDINGS Hepatobiliary: No focal liver abnormality is seen. Gallbladder is distended but otherwise unremarkable. No bile duct dilatation is seen. Pancreas: Unremarkable. No pancreatic ductal dilatation or surrounding inflammatory changes. Spleen: Normal in size without focal abnormality. Adrenals/Urinary Tract: Adrenal  glands appear normal. Kidneys are unremarkable without suspicious mass, stone or hydronephrosis. No obstructing ureteral stone. Bladder is unremarkable, partially decompressed. Stomach/Bowel: No dilated large or small bowel loops. Extensive diverticulosis of the transverse,  descending and sigmoid colon but no focal inflammatory change to suggest acute diverticulitis. Mildly distended small bowel loops throughout the abdomen and pelvis, with fluid and associated air-fluid levels throughout. Stomach is unremarkable, partially decompressed. Vascular/Lymphatic: No acute-appearing vascular abnormality. Mild aortic atherosclerosis. No enlarged lymph nodes are seen in the abdomen or pelvis. Reproductive: Uterus and bilateral adnexa are unremarkable. Other: Fluid stranding and nodular thickening of the anterior mesentery and central mesentery, most suggestive of omental caking suggesting neoplastic process (peritoneal carcinomatosis. Small amount of free fluid adjacent to the liver and spleen. No circumscribed fluid collection or abscess-like collection is identified. Musculoskeletal: Degenerative spondylosis of the lumbar spine, moderate in degree. No acute or suspicious osseous abnormality. (Lucent lesion within the medial aspect of the LEFT iliac bone, of uncertain significance, possibly a small benign bone island or degenerative subchondral cyst. No additional similar-appearing lesions within the axial skeleton or osseous pelvis. IMPRESSION: 1. 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 Differential for peritoneal carcinomatosis is primarily neoplastic and includes cancers of the ovary, appendix, colon, pancreas and stomach. PET-CT may be helpful for further characterization. Tissue sampling of the mesentery may be eventually required for diagnosis. 2. Mildly  distended small bowel loops throughout the abdomen and pelvis, with fluid and associated air-fluid levels throughout the nondistended small bowel, suggesting a mild ileus versus partial small bowel obstruction. Favor partial small bowel obstruction with transition zone in the RIGHT lower quadrant, likely related to aforementioned neoplastic peritoneal implants and/or adhesions. 3. Small free fluid within the RIGHT upper quadrant and LEFT upper quadrant. No abscess collection seen. No free intraperitoneal air seen. 4. Extensive colonic diverticulosis without evidence of acute diverticulitis. 5. Small chronic pleural effusions with associated atelectasis. Aortic Atherosclerosis (ICD10-I70.0). These results were called by telephone at the time of interpretation on 12/28/2020 at 12:27 pm to provider [facility] , who verbally acknowledged these results. Electronically Signed   By: Franki Cabot M.D.   On: 12/28/2020 12:28   DG Chest Portable 1 View  Result Date: 12/28/2020 CLINICAL DATA:  Severe abdominal pain. Clinical concern for free peritoneal air. EXAM: PORTABLE CHEST 1 VIEW COMPARISON:  05/21/2020 FINDINGS: Normal sized heart. Clear lungs. No free peritoneal air seen. Thoracic spine degenerative changes. IMPRESSION: No free peritoneal air or other acute abnormality. Electronically Signed   By: Claudie Revering M.D.   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 Colonic/appendiceal are less likely given that she had a normal colonoscopy 2 years ago. 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

## 2020-12-30 NOTE — Anesthesia Procedure Notes (Signed)
Procedure Name: Intubation Date/Time: 12/30/2020 2:26 PM Performed by: Kiyo Heal D, CRNA Pre-anesthesia Checklist: Patient identified, Emergency Drugs available, Suction available and Patient being monitored Patient Re-evaluated:Patient Re-evaluated prior to induction Oxygen Delivery Method: Circle system utilized Preoxygenation: Pre-oxygenation with 100% oxygen Induction Type: IV induction, Rapid sequence and Cricoid Pressure applied Laryngoscope Size: Mac and 4 Grade View: Grade I Tube type: Oral Tube size: 7.5 mm Number of attempts: 1 Airway Equipment and Method: Stylet and Oral airway Placement Confirmation: ETT inserted through vocal cords under direct vision, positive ETCO2 and breath sounds checked- equal and bilateral Secured at: 21 cm Tube secured with: Tape Dental Injury: Teeth and Oropharynx as per pre-operative assessment

## 2020-12-30 NOTE — Anesthesia Preprocedure Evaluation (Addendum)
Anesthesia Evaluation  Patient identified by MRN, date of birth, ID band Patient awake    Reviewed: Allergy & Precautions, NPO status , Patient's Chart, lab work & pertinent test results  History of Anesthesia Complications Negative for: history of anesthetic complications  Airway Mallampati: III  TM Distance: >3 FB Neck ROM: Full    Dental  (+) Dental Advisory Given   Pulmonary asthma , sleep apnea and Continuous Positive Airway Pressure Ventilation ,    Pulmonary exam normal        Cardiovascular hypertension, Pt. on medications + CAD  Normal cardiovascular exam   AAA  '21 TTE - EF 55 to 60%. Left atrial size was mildly dilated.     Neuro/Psych negative neurological ROS  negative psych ROS   GI/Hepatic Neg liver ROS, hiatal hernia, GERD  Medicated and Controlled,  Endo/Other  Morbid obesity K 3.3   Renal/GU negative Renal ROS     Musculoskeletal negative musculoskeletal ROS (+)   Abdominal   Peds  Hematology  On plavix    Anesthesia Other Findings   Reproductive/Obstetrics                            Anesthesia Physical Anesthesia Plan  ASA: 3  Anesthesia Plan: General   Post-op Pain Management:    Induction: Intravenous and Rapid sequence  PONV Risk Score and Plan: 3 and Treatment may vary due to age or medical condition, Ondansetron, Dexamethasone and Propofol infusion  Airway Management Planned: Oral ETT  Additional Equipment: None  Intra-op Plan:   Post-operative Plan: Extubation in OR  Informed Consent: I have reviewed the patients History and Physical, chart, labs and discussed the procedure including the risks, benefits and alternatives for the proposed anesthesia with the patient or authorized representative who has indicated his/her understanding and acceptance.     Dental advisory given  Plan Discussed with: CRNA and Anesthesiologist  Anesthesia Plan  Comments:        Anesthesia Quick Evaluation

## 2020-12-30 NOTE — Transfer of Care (Signed)
Immediate Anesthesia Transfer of Care Note  Patient: Brittany Middleton  Procedure(s) Performed: LAPAROSCOPY DIAGNOSTIC; WITH  BIOPSY OF ABDOMINAL WALL NODULES AND BIOPSY OF PERICOLONIC EXUDATE  Patient Location: PACU  Anesthesia Type:General  Level of Consciousness: awake, alert  and oriented  Airway & Oxygen Therapy: Patient Spontanous Breathing and Patient connected to face mask oxygen  Post-op Assessment: Report given to RN and Post -op Vital signs reviewed and stable  Post vital signs: Reviewed and stable  Last Vitals:  Vitals Value Taken Time  BP 187/72 12/30/20 1534  Temp    Pulse 69 12/30/20 1536  Resp 21 12/30/20 1536  SpO2 96 % 12/30/20 1536  Vitals shown include unvalidated device data.  Last Pain:  Vitals:   12/30/20 1259  TempSrc: Oral  PainSc: 2       Patients Stated Pain Goal: 2 (62/69/48 [id number])  Complications: No notable events documented.

## 2020-12-30 NOTE — Progress Notes (Signed)
PROGRESS NOTE    Brittany Middleton  TJQ:[id number] DOB: 18-Apr-1951 DOA: 12/28/2020 PCP: [name], MD   Brief Narrative:  HPI: Brittany Middleton is a 70 y.o. female with medical history significant of HTN, HLD, chronic interstitial cystitis. Presenting with abdominal pain. Her symptoms start 2 weeks ago. She had abdominal spasm across the top of her abdomen that would come on strong and then release on its own. She tried adjusting her diet. She ate yogurt and toast, but after a few days, she didn't want to eat at all. She started having more intense pain: "like I was having a baby". She spoke with her GI doc who prescribed some unspecified medications. However, they didn't help. She began to feel more bloated and nauseous. Last night her bloating increased in intensity. She was able to have a small BM , but it didn't provide full relief. She decided to come to the ED for help. She denies any other aggravating or alleviating factors.    ED Course: CT showed a thickening of the omentum that was concerning for peritoneal carcinomatosis. CT was also concerning for SBO. [facility] was consulted. [facility] was called for admission.  Assessment & Plan:   Active Problems:   SBO (small bowel obstruction) (HCC)   Partial SBO: Patient feeling better and passing flatus but no bowel movement.  No nausea.  Refused NG tube. Surgery following and managing.  They are planning to do a laparoscopic biopsy today and might need bowel resection.  Appreciate surgery help.   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  Scheduled for laparoscopic biopsy today.  Hypokalemia: Low again, will replace.  Recheck in the morning.   Essential HTN: Blood pressure slightly elevated but she is n.p.o. due to surgery today.  Continue as needed IV hydralazine in the meantime.  GERD: Continue Protonix.   HLD: resume home statin once off NPO status    Hx of coronary artery aneurysm: Plavix on hold.  Chronic interstitial cystitis: Patient has no UTI symptoms and she is afebrile with no leukocytosis.  For some reason UA and urine culture has been sent.  UA showing positive nitrites and leukoesterase and bacteria does not indicate UTI in the face of patient being asymptomatic and no other signs of infection.  Will monitor.   Morbid obesity: Weight loss counseling provided.  DVT prophylaxis:    Code Status: Full Code  Family Communication: None present at bedside.  Plan of care discussed with the patient.  She is fully alert and oriented and well versed with her medical condition.  Status is: Inpatient  Remains inpatient appropriate because:Ongoing diagnostic testing needed not appropriate for outpatient work up  Dispo: The patient is from: Home              Anticipated d/c is to: Home              Patient currently is not medically stable to d/c.   Difficult to place patient No        Estimated body mass index is 43.27 kg/m as calculated from the following:   Height as of this encounter: 5\' 5"  (1.651 m).   Weight as of this encounter: 117.9 kg.      Nutritional status:  Nutrition Problem: Inadequate oral intake Etiology: inability to eat   Signs/Symptoms: NPO status   Interventions: Refer to RD note for recommendations    Consultants:  General surgery  Procedures:  None  Antimicrobials:  Anti-infectives (From admission, onward)    Start     Dose/Rate Route Frequency Ordered Stop   12/30/20 1400  ceFAZolin (ANCEF) 2 g in sodium chloride 0.9 % 100 mL IVPB        2 g 200 mL/hr over 30 Minutes Intravenous  Once 12/30/20 1013            Subjective: Seen and examined.  Passing flatus.  No nausea or abdominal pain.  No bowel movement yet.  No other complaint.  Objective: Vitals:   12/29/20 0953 12/29/20 1256 12/29/20 2102 12/30/20 0540  BP: (!) 149/63 (!) 163/65 (!) 142/69 (!) 159/71  Pulse: 62 65 60  (!) 55  Resp: 16 18 17 17   Temp: 99.2 F (37.3 C)  98.1 F (36.7 C) 97.9 F (36.6 C)  TempSrc: Oral  Oral Oral  SpO2: 94% 96% 95% 94%  Weight:      Height:        Intake/Output Summary (Last 24 hours) at 12/30/2020 1126 Last data filed at 12/30/2020 0600 Gross per 24 hour  Intake 1644.32 ml  Output --  Net 1644.32 ml    Filed Weights   12/28/20 0938  Weight: 117.9 kg    Examination:  General exam: Appears calm and comfortable  Respiratory system: Clear to auscultation. Respiratory effort normal. Cardiovascular system: S1 & S2 heard, RRR. No JVD, murmurs, rubs, gallops or clicks. No pedal edema. Gastrointestinal system: Abdomen is nondistended, soft and nontender. No organomegaly or masses felt. Normal bowel sounds heard. Central nervous system: Alert and oriented. No focal neurological deficits. Extremities: Symmetric 5 x 5 power. Skin: No rashes, lesions or ulcers.  Psychiatry: Judgement and insight appear normal. Mood & affect appropriate.    Data Reviewed: I have personally reviewed following labs and imaging studies  CBC: Recent Labs  Lab 12/28/20 1057 12/29/20 0323 12/30/20 0318  WBC 5.5 3.2* 4.2  NEUTROABS 3.9  --  2.6  HGB 14.2 12.4 12.4  HCT 44.1 38.6 39.6  MCV 88.2 89.6 89.4  PLT 274 223 454    Basic Metabolic Panel: Recent Labs  Lab 12/28/20 1057 12/28/20 1549 12/29/20 0323 12/30/20 0318  NA 138  --  139 142  K 3.4*  --  3.7 3.3*  CL 100  --  106 108  CO2 30  --  27 25  GLUCOSE 115*  --  97 84  BUN 8  --  9 9  CREATININE 0.67  --  0.58 0.55  CALCIUM 9.1  --  8.3* 8.3*  MG  --  2.0  --   --     GFR: Estimated Creatinine Clearance: 86.5 mL/min (by C-G formula based on SCr of 0.55 mg/dL). Liver Function Tests: Recent Labs  Lab 12/28/20 1057 12/29/20 0323  AST 24 23  ALT 20 16  ALKPHOS 55 42  BILITOT 1.1 0.9  PROT 7.6 6.0*  ALBUMIN 4.3 3.3*    Recent Labs  Lab 12/28/20 1057  LIPASE 30    No results for input(s): AMMONIA  in the last 168 hours. Coagulation Profile: No results for input(s): INR, PROTIME in the last 168 hours. Cardiac Enzymes: No results for input(s): CKTOTAL, CKMB, CKMBINDEX, TROPONINI in the last 168 hours. BNP (last 3 results) No results for input(s): PROBNP in the last 8760 hours. HbA1C: No results for input(s): HGBA1C in the last 72 hours. CBG: No results for input(s): GLUCAP in the last 168 hours. Lipid Profile: No results for input(s): CHOL,  HDL, LDLCALC, TRIG, CHOLHDL, LDLDIRECT in the last 72 hours. Thyroid Function Tests: No results for input(s): TSH, T4TOTAL, FREET4, T3FREE, THYROIDAB in the last 72 hours. Anemia Panel: No results for input(s): VITAMINB12, FOLATE, FERRITIN, TIBC, IRON, RETICCTPCT in the last 72 hours. Sepsis Labs: Recent Labs  Lab 12/28/20 1057  LATICACIDVEN 1.1     Recent Results (from the past 240 hour(s))  Urine Culture     Status: Abnormal   Collection Time: 12/28/20 10:57 AM   Specimen: Urine, Random  Result Value Ref Range Status   Specimen Description   Final    URINE, RANDOM Performed at [facility] [address], [place], [place] [address]    Special Requests   Final    NONE Performed at [facility], [place] [address] [place],  [address]    Culture (A)  Final    >=100,000 COLONIES/mL ESCHERICHIA COLI Confirmed Extended Spectrum Beta-Lactamase Producer (ESBL).  In bloodstream infections from ESBL organisms, carbapenems are preferred over piperacillin/tazobactam. They are shown to have a lower risk of mortality.    Report Status 12/30/2020 FINAL  Final   Organism ID, Bacteria ESCHERICHIA COLI (A)  Final      Susceptibility   Escherichia coli - MIC*    AMPICILLIN >=32 RESISTANT Resistant     CEFAZOLIN >=64 RESISTANT Resistant     CEFEPIME >=32 RESISTANT Resistant     CEFTRIAXONE >=64 RESISTANT Resistant     CIPROFLOXACIN >=4 RESISTANT Resistant     GENTAMICIN >=16 RESISTANT Resistant      IMIPENEM <=0.25 SENSITIVE Sensitive     NITROFURANTOIN <=16 SENSITIVE Sensitive     TRIMETH/SULFA >=320 RESISTANT Resistant     AMPICILLIN/SULBACTAM >=32 RESISTANT Resistant     PIP/TAZO 8 SENSITIVE Sensitive     * >=100,000 COLONIES/mL ESCHERICHIA COLI  Resp Panel by RT-PCR (Flu A&B, Covid) Nasopharyngeal Swab     Status: None   Collection Time: 12/28/20  1:26 PM   Specimen: Nasopharyngeal Swab; Nasopharyngeal(NP) swabs in vial transport medium  Result Value Ref Range Status   SARS Coronavirus 2 by RT PCR NEGATIVE NEGATIVE Final    Comment: (NOTE) SARS-CoV-2 target nucleic acids are NOT DETECTED.  The SARS-CoV-2 RNA is generally detectable in upper respiratory specimens during the acute phase of infection. The lowest concentration of SARS-CoV-2 viral copies this assay can detect is 138 copies/mL. A negative result does not preclude SARS-Cov-2 infection and should not be used as the sole basis for treatment or other patient management decisions. A negative result may occur with  improper specimen collection/handling, submission of specimen other than nasopharyngeal swab, presence of viral mutation(s) within the areas targeted by this assay, and inadequate number of viral copies(<138 copies/mL). A negative result must be combined with clinical observations, patient history, and epidemiological information. The expected result is Negative.  Fact Sheet for Patients:  EntrepreneurPulse.com.au  Fact Sheet for Healthcare Providers:  IncredibleEmployment.be  This test is no t yet approved or cleared by the [place] FDA and  has been authorized for detection and/or diagnosis of SARS-CoV-2 by FDA under an Emergency Use Authorization (EUA). This EUA will remain  in effect (meaning this test can be used) for the duration of the COVID-19 declaration under Section 564(b)(1) of the Act, 21 U.S.C.section 360bbb-3(b)(1), unless the authorization is  terminated  or revoked sooner.       Influenza A by PCR NEGATIVE NEGATIVE Final   Influenza B by PCR NEGATIVE NEGATIVE Final    Comment: (  NOTE) The Xpert Xpress SARS-CoV-2/FLU/RSV plus assay is intended as an aid in the diagnosis of influenza from Nasopharyngeal swab specimens and should not be used as a sole basis for treatment. Nasal washings and aspirates are unacceptable for Xpert Xpress SARS-CoV-2/FLU/RSV testing.  Fact Sheet for Patients: EntrepreneurPulse.com.au  Fact Sheet for Healthcare Providers: IncredibleEmployment.be  This test is not yet approved or cleared by the [place] FDA and has been authorized for detection and/or diagnosis of SARS-CoV-2 by FDA under an Emergency Use Authorization (EUA). This EUA will remain in effect (meaning this test can be used) for the duration of the COVID-19 declaration under Section 564(b)(1) of the Act, 21 U.S.C. section 360bbb-3(b)(1), unless the authorization is terminated or revoked.  Performed at [facility], [place] [address] [place], [place] [address]        Radiology Studies: CT CHEST WO CONTRAST  Result Date: 12/28/2020 CLINICAL DATA:  Acute abdominal pain.  Shortness of breath. EXAM: CT CHEST, ABDOMEN, AND PELVIS WITH CONTRAST TECHNIQUE: Multidetector CT imaging of the chest, abdomen and pelvis was performed following the standard protocol during bolus administration of intravenous contrast. CONTRAST:  62mL OMNIPAQUE IOHEXOL 300 MG/ML  SOLN COMPARISON:  Chest CT dated 04/27/2020. CT abdomen dated 05/21/2020. FINDINGS: CT CHEST FINDINGS Cardiovascular: No thoracic aortic aneurysm. No pericardial effusion. Scattered coronary artery calcifications. Mild aortic atherosclerosis. 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 Mediastinum/Nodes: No mass or enlarged lymph nodes within the mediastinum or perihilar regions.  Esophagus appears normal. Trachea and central bronchi are unremarkable. Lungs/Pleura: Small chronic pleural effusions with associated atelectasis. Lungs otherwise clear. No pneumothorax. Musculoskeletal: Mild degenerative spondylosis of the kyphotic thoracic spine. No acute-appearing osseous abnormality. CT ABDOMEN PELVIS FINDINGS Hepatobiliary: No focal liver abnormality is seen. Gallbladder is distended but otherwise unremarkable. No bile duct dilatation is seen. Pancreas: Unremarkable. No pancreatic ductal dilatation or surrounding inflammatory changes. Spleen: Normal in size without focal abnormality. Adrenals/Urinary Tract: Adrenal glands appear normal. Kidneys are unremarkable without suspicious mass, stone or hydronephrosis. No obstructing ureteral stone. Bladder is unremarkable, partially decompressed. Stomach/Bowel: No dilated large or small bowel loops. Extensive diverticulosis of the transverse, descending and sigmoid colon but no focal inflammatory change to suggest acute diverticulitis. Mildly distended small bowel loops throughout the abdomen and pelvis, with fluid and associated air-fluid levels throughout. Stomach is unremarkable, partially decompressed. Vascular/Lymphatic: No acute-appearing vascular abnormality. Mild aortic atherosclerosis. No enlarged lymph nodes are seen in the abdomen or pelvis. Reproductive: Uterus and bilateral adnexa are unremarkable. Other: Fluid stranding and nodular thickening of the anterior mesentery and central mesentery, most suggestive of omental caking suggesting neoplastic process (peritoneal carcinomatosis. Small amount of free fluid adjacent to the liver and spleen. No circumscribed fluid collection or abscess-like collection is identified. Musculoskeletal: Degenerative spondylosis of the lumbar spine, moderate in degree. No acute or suspicious osseous abnormality. (Lucent lesion within the medial aspect of the LEFT iliac bone, of uncertain significance, possibly  a small benign bone island or degenerative subchondral cyst. No additional similar-appearing lesions within the axial skeleton or osseous pelvis. IMPRESSION: 1. 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 Differential for peritoneal carcinomatosis is primarily neoplastic and includes cancers of the ovary, appendix, colon, pancreas and stomach. PET-CT may be helpful for further characterization. Tissue sampling of the mesentery may be eventually required for diagnosis. 2. Mildly distended small bowel loops throughout the abdomen and  pelvis, with fluid and associated air-fluid levels throughout the nondistended small bowel, suggesting a mild ileus versus partial small bowel obstruction. Favor partial small bowel obstruction with transition zone in the RIGHT lower quadrant, likely related to aforementioned neoplastic peritoneal implants and/or adhesions. 3. Small free fluid within the RIGHT upper quadrant and LEFT upper quadrant. No abscess collection seen. No free intraperitoneal air seen. 4. Extensive colonic diverticulosis without evidence of acute diverticulitis. 5. Small chronic pleural effusions with associated atelectasis. Aortic Atherosclerosis (ICD10-I70.0). These results were called by telephone at the time of interpretation on 12/28/2020 at 12:27 pm to provider [facility] , who verbally acknowledged these results. Electronically Signed   By: Franki Cabot M.D.   On: 12/28/2020 12:28   CT ABDOMEN PELVIS W CONTRAST  Result Date: 12/28/2020 CLINICAL DATA:  Acute abdominal pain.  Shortness of breath. EXAM: CT CHEST, ABDOMEN, AND PELVIS WITH CONTRAST TECHNIQUE: Multidetector CT imaging of the chest, abdomen and pelvis was performed following the standard protocol during bolus administration of intravenous contrast. CONTRAST:  90mL OMNIPAQUE IOHEXOL 300  MG/ML  SOLN COMPARISON:  Chest CT dated 04/27/2020. CT abdomen dated 05/21/2020. FINDINGS: CT CHEST FINDINGS Cardiovascular: No thoracic aortic aneurysm. No pericardial effusion. Scattered coronary artery calcifications. Mild aortic atherosclerosis. 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 Mediastinum/Nodes: No mass or enlarged lymph nodes within the mediastinum or perihilar regions. Esophagus appears normal. Trachea and central bronchi are unremarkable. Lungs/Pleura: Small chronic pleural effusions with associated atelectasis. Lungs otherwise clear. No pneumothorax. Musculoskeletal: Mild degenerative spondylosis of the kyphotic thoracic spine. No acute-appearing osseous abnormality. CT ABDOMEN PELVIS FINDINGS Hepatobiliary: No focal liver abnormality is seen. Gallbladder is distended but otherwise unremarkable. No bile duct dilatation is seen. Pancreas: Unremarkable. No pancreatic ductal dilatation or surrounding inflammatory changes. Spleen: Normal in size without focal abnormality. Adrenals/Urinary Tract: Adrenal glands appear normal. Kidneys are unremarkable without suspicious mass, stone or hydronephrosis. No obstructing ureteral stone. Bladder is unremarkable, partially decompressed. Stomach/Bowel: No dilated large or small bowel loops. Extensive diverticulosis of the transverse, descending and sigmoid colon but no focal inflammatory change to suggest acute diverticulitis. Mildly distended small bowel loops throughout the abdomen and pelvis, with fluid and associated air-fluid levels throughout. Stomach is unremarkable, partially decompressed. Vascular/Lymphatic: No acute-appearing vascular abnormality. Mild aortic atherosclerosis. No enlarged lymph nodes are seen in the abdomen or pelvis. Reproductive: Uterus and bilateral adnexa are unremarkable. Other: Fluid stranding and nodular thickening of the anterior mesentery and central mesentery, most  suggestive of omental caking suggesting neoplastic process (peritoneal carcinomatosis. Small amount of free fluid adjacent to the liver and spleen. No circumscribed fluid collection or abscess-like collection is identified. Musculoskeletal: Degenerative spondylosis of the lumbar spine, moderate in degree. No acute or suspicious osseous abnormality. (Lucent lesion within the medial aspect of the LEFT iliac bone, of uncertain significance, possibly a small benign bone island or degenerative subchondral cyst. No additional similar-appearing lesions within the axial skeleton or osseous pelvis. IMPRESSION: 1. 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 Differential for peritoneal carcinomatosis is primarily neoplastic and includes cancers of the ovary, appendix, colon, pancreas and stomach. PET-CT may be helpful for further characterization. Tissue sampling of the mesentery may be eventually required for diagnosis. 2. Mildly distended small bowel loops throughout the abdomen and pelvis, with fluid and associated air-fluid levels throughout the nondistended small bowel, suggesting a mild ileus versus partial small  bowel obstruction. Favor partial small bowel obstruction with transition zone in the RIGHT lower quadrant, likely related to aforementioned neoplastic peritoneal implants and/or adhesions. 3. Small free fluid within the RIGHT upper quadrant and LEFT upper quadrant. No abscess collection seen. No free intraperitoneal air seen. 4. Extensive colonic diverticulosis without evidence of acute diverticulitis. 5. Small chronic pleural effusions with associated atelectasis. Aortic Atherosclerosis (ICD10-I70.0). These results were called by telephone at the time of interpretation on 12/28/2020 at 12:27 pm to provider [facility] , who verbally acknowledged these  results. Electronically Signed   By: Franki Cabot M.D.   On: 12/28/2020 12:28   DG Chest Portable 1 View  Result Date: 12/28/2020 CLINICAL DATA:  Severe abdominal pain. Clinical concern for free peritoneal air. EXAM: PORTABLE CHEST 1 VIEW COMPARISON:  05/21/2020 FINDINGS: Normal sized heart. Clear lungs. No free peritoneal air seen. Thoracic spine degenerative changes. IMPRESSION: No free peritoneal air or other acute abnormality. Electronically Signed   By: Claudie Revering M.D.   On: 12/28/2020 11:58    Scheduled Meds:  budesonide  0.25 mg Nebulization BID    ceFAZolin (ANCEF) IV  2 g Intravenous Once   enoxaparin (LOVENOX) injection  60 mg Subcutaneous Q24H   pantoprazole (PROTONIX) IV  40 mg Intravenous Q24H   Continuous Infusions:  sodium chloride 75 mL/hr at 12/29/20 1854   potassium chloride     potassium chloride 10 mEq (12/30/20 1120)     LOS: 2 days   Time spent: 30 minutes   Darliss Cheney, MD [facility]  12/30/2020, 11:26 AM   How to contact the [facility] Attending or Consulting provider [place] or covering provider during after hours [place], for this patient?  Check the care team in [facility] and look for a) attending/consulting [facility] provider listed and b) the [facility] team listed. Page or secure chat [phone]. Log into [email or web address] and use [place]'s universal password to access. If you do not have the password, please contact the hospital operator. Locate the [facility] provider you are looking for under [facility] and page to a number that you can be directly reached. If you still have difficulty reaching the provider, please page the [facility] (Director on Call) for the Hospitalists listed on amion for assistance.

## 2020-12-30 NOTE — Anesthesia Postprocedure Evaluation (Signed)
Anesthesia Post Note  Patient: Brittany Middleton  Procedure(s) Performed: LAPAROSCOPY DIAGNOSTIC; WITH  BIOPSY OF ABDOMINAL WALL NODULES AND BIOPSY OF PERICOLONIC EXUDATE     Patient location during evaluation: PACU Anesthesia Type: General Level of consciousness: awake and alert Pain management: pain level controlled Vital Signs Assessment: post-procedure vital signs reviewed and stable Respiratory status: spontaneous breathing, nonlabored ventilation and respiratory function stable Cardiovascular status: blood pressure returned to baseline and stable Postop Assessment: no apparent nausea or vomiting Anesthetic complications: no   No notable events documented.  Last Vitals:  Vitals:   12/30/20 1545 12/30/20 1600  BP: (!) 186/82 (!) 162/80  Pulse: 67 65  Resp: 20 20  Temp:    SpO2: 98% 97%    Last Pain:  Vitals:   12/30/20 1620  TempSrc:   PainSc: [place]

## 2020-12-31 LAB — CBC WITH DIFFERENTIAL/PLATELET
Abs Immature Granulocytes: 0.04 10*3/uL (ref 0.00–0.07)
Basophils Absolute: 0 10*3/uL (ref 0.0–0.1)
Basophils Relative: 0 %
Eosinophils Absolute: 0 10*3/uL (ref 0.0–0.5)
Eosinophils Relative: 0 %
HCT: 39.9 % (ref 36.0–46.0)
Hemoglobin: 12.8 g/dL (ref 12.0–15.0)
Immature Granulocytes: 1 %
Lymphocytes Relative: 7 %
Lymphs Abs: 0.5 10*3/uL — ABNORMAL LOW (ref 0.7–4.0)
MCH: 28.4 pg (ref 26.0–34.0)
MCHC: 32.1 g/dL (ref 30.0–36.0)
MCV: 88.5 fL (ref 80.0–100.0)
Monocytes Absolute: 0.2 10*3/uL (ref 0.1–1.0)
Monocytes Relative: 2 %
Neutro Abs: 7.4 10*3/uL (ref 1.7–7.7)
Neutrophils Relative %: 90 %
Platelets: 255 10*3/uL (ref 150–400)
RBC: 4.51 MIL/uL (ref 3.87–5.11)
RDW: 13.7 % (ref 11.5–15.5)
WBC: 8.1 10*3/uL (ref 4.0–10.5)
nRBC: 0 % (ref 0.0–0.2)

## 2020-12-31 LAB — BASIC METABOLIC PANEL
Anion gap: 10 (ref 5–15)
BUN: 10 mg/dL (ref 8–23)
CO2: 24 mmol/L (ref 22–32)
Calcium: 8.6 mg/dL — ABNORMAL LOW (ref 8.9–10.3)
Chloride: 103 mmol/L (ref 98–111)
Creatinine, Ser: 0.61 mg/dL (ref 0.44–1.00)
GFR, Estimated: >60 mL/min (ref >60)
Glucose, Bld: 122 mg/dL — ABNORMAL HIGH (ref 70–99)
Potassium: 4.1 mmol/L (ref 3.5–5.1)
Sodium: 137 mmol/L (ref 135–145)

## 2020-12-31 LAB — MAGNESIUM: Magnesium: 1.8 mg/dL (ref 1.7–2.4)

## 2020-12-31 MED ORDER — GLYCERIN (LAXATIVE) 2 G RE SUPP
1.0000 | RECTAL | Status: DC | PRN
  Filled 2020-12-31: qty 1

## 2020-12-31 NOTE — Care Management Important Message (Signed)
Important Message  Patient Details IM Letter given to the Patient. Name: Brittany Middleton MRN: [id number] Date of Birth: 1950/08/02   Medicare Important Message Given:  Yes     Kerin Salen 12/31/2020, 12:13 PM

## 2020-12-31 NOTE — Progress Notes (Signed)
PROGRESS NOTE    Brittany Middleton  QJJ:[id number] DOB: [birth year] DOA: 12/28/2020 PCP: [name], MD   Brief Narrative:  Brittany Middleton is a 70 y.o. female with medical history significant of HTN, HLD, chronic interstitial cystitis presented with abdominal pain for 2 weeks aggravated with food intake.  She spoke with her GI doc who prescribed some unspecified medications. However, they didn't help.  She also developed some constipation.  She decided to come to the ED for help.  Upon arrival to ED, she was hemodynamically stable however CT showed a thickening of the omentum that was concerning for peritoneal carcinomatosis and concerning for SBO. [facility] was consulted.  Patient admitted under hospitalist service, initially managed conservatively and subsequently underwent diagnostic laparoscopy with abdominal wall biopsy by Dr. Kieth Brightly on 12/30/2020.  Assessment & Plan:   Active Problems:   HTN (hypertension)   Hypercholesterolemia   Hypokalemia   SBO (small bowel obstruction) (HCC)   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HCC)   Partial SBO: Patient feeling better and passing flatus but no bowel movement.  No nausea.  Tolerating liquid diet.  She is willing to try advance diet.  Will advance to full liquid and then gradually advance to regular diet by tonight if she tolerates that.  Nursing aware.   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  Underwent diagnostic laparoscopy with abdominal wall biopsy by Dr. Continued on 12/30/2020.  Pathology pending.  Hypokalemia: Resolved.   Essential HTN: Blood pressure reasonably controlled.  Continue as needed IV hydralazine in the meantime.  GERD: Continue Protonix.   HLD: resume home statin once off NPO status   Hx of coronary artery aneurysm: Plavix on hold.  Chronic interstitial cystitis: Patient has no UTI symptoms and she is afebrile with no leukocytosis.   For some reason UA and urine culture has been sent.  UA showing positive nitrites and leukoesterase and bacteria does not indicate UTI in the face of patient being asymptomatic and no other signs of infection.  Will monitor.   Morbid obesity: Weight loss counseling provided.  DVT prophylaxis:    Code Status: Full Code  Family Communication: None present at bedside.  Plan of care discussed with the patient.  She is fully alert and oriented and well versed with her medical condition.  Status is: Inpatient  Remains inpatient appropriate because:Ongoing diagnostic testing needed not appropriate for outpatient work up  Dispo: The patient is from: Home              Anticipated d/c is to: Home              Patient currently is not medically stable to d/c.   Difficult to place patient No        Estimated body mass index is 43.27 kg/m as calculated from the following:   Height as of this encounter: 5\' 5"  (1.651 m).   Weight as of this encounter: 117.9 kg.      Nutritional status:  Nutrition Problem: Inadequate oral intake Etiology: inability to eat   Signs/Symptoms: NPO status   Interventions: Refer to RD note for recommendations    Consultants:  General surgery  Procedures:  As above  Antimicrobials:  Anti-infectives (From admission, onward)    Start     Dose/Rate Route Frequency Ordered Stop   12/30/20 1400  ceFAZolin (ANCEF) 2 g in sodium chloride 0.9 % 100 mL IVPB        2 g  200 mL/hr over 30 Minutes Intravenous  Once 12/30/20 1013 12/30/20 1429          Subjective: Seen and examined.  Feels better.  Passing flatus.  No abdominal pain.  Objective: Vitals:   12/30/20 1947 12/31/20 0224 12/31/20 0510 12/31/20 0945  BP: (!) 162/77 (!) 142/68 (!) 158/64 (!) 146/66  Pulse: 60 62 60 (!) 57  Resp: 17 17 17 18   Temp: (!) 96.6 F (35.9 C) 98.1 F (36.7 C) 98.1 F (36.7 C) 98.7 F (37.1 C)  TempSrc: Axillary Oral Oral Oral  SpO2: 93% 92% 95% 93%   Weight:      Height:        Intake/Output Summary (Last 24 hours) at 12/31/2020 1152 Last data filed at 12/31/2020 1027 Gross per 24 hour  Intake 2915.87 ml  Output --  Net 2915.87 ml    Filed Weights   12/28/20 0938 12/30/20 1259  Weight: 117.9 kg 117.9 kg    Examination:  General exam: Appears calm and comfortable  Respiratory system: Clear to auscultation. Respiratory effort normal. Cardiovascular system: S1 & S2 heard, RRR. No JVD, murmurs, rubs, gallops or clicks. No pedal edema. Gastrointestinal system: Abdomen is nondistended, soft and minimal tenderness at laparoscopic site. No organomegaly or masses felt. Normal bowel sounds heard. Central nervous system: Alert and oriented. No focal neurological deficits. Extremities: Symmetric 5 x 5 power. Skin: No rashes, lesions or ulcers.  Psychiatry: Judgement and insight appear normal. Mood & affect appropriate.    Data Reviewed: I have personally reviewed following labs and imaging studies  CBC: Recent Labs  Lab 12/28/20 1057 12/29/20 0323 12/30/20 0318 12/31/20 0317  WBC 5.5 3.2* 4.2 8.1  NEUTROABS 3.9  --  2.6 7.4  HGB 14.2 12.4 12.4 12.8  HCT 44.1 38.6 39.6 39.9  MCV 88.2 89.6 89.4 88.5  PLT 274 223 239 944    Basic Metabolic Panel: Recent Labs  Lab 12/28/20 1057 12/28/20 1549 12/29/20 0323 12/30/20 0318 12/31/20 0317  NA 138  --  139 142 137  K 3.4*  --  3.7 3.3* 4.1  CL 100  --  106 108 103  CO2 30  --  27 25 24   GLUCOSE 115*  --  97 84 122*  BUN 8  --  9 9 10   CREATININE 0.67  --  0.58 0.55 0.61  CALCIUM 9.1  --  8.3* 8.3* 8.6*  MG  --  2.0  --   --  1.8    GFR: Estimated Creatinine Clearance: 86.5 mL/min (by C-G formula based on SCr of 0.61 mg/dL). Liver Function Tests: Recent Labs  Lab 12/28/20 1057 12/29/20 0323  AST 24 23  ALT 20 16  ALKPHOS 55 42  BILITOT 1.1 0.9  PROT 7.6 6.0*  ALBUMIN 4.3 3.3*    Recent Labs  Lab 12/28/20 1057  LIPASE 30    No results for input(s):  AMMONIA in the last 168 hours. Coagulation Profile: No results for input(s): INR, PROTIME in the last 168 hours. Cardiac Enzymes: No results for input(s): CKTOTAL, CKMB, CKMBINDEX, TROPONINI in the last 168 hours. BNP (last 3 results) No results for input(s): PROBNP in the last 8760 hours. HbA1C: No results for input(s): HGBA1C in the last 72 hours. CBG: No results for input(s): GLUCAP in the last 168 hours. Lipid Profile: No results for input(s): CHOL, HDL, LDLCALC, TRIG, CHOLHDL, LDLDIRECT in the last 72 hours. Thyroid Function Tests: No results for input(s): TSH, T4TOTAL, FREET4, T3FREE, THYROIDAB in the  last 72 hours. Anemia Panel: No results for input(s): VITAMINB12, FOLATE, FERRITIN, TIBC, IRON, RETICCTPCT in the last 72 hours. Sepsis Labs: Recent Labs  Lab 12/28/20 1057  LATICACIDVEN 1.1     Recent Results (from the past 240 hour(s))  Urine Culture     Status: Abnormal   Collection Time: 12/28/20 10:57 AM   Specimen: Urine, Random  Result Value Ref Range Status   Specimen Description   Final    URINE, RANDOM Performed at [facility] [address], [place], [place] [address]    Special Requests   Final    NONE Performed at [facility], [place] [address] [place],  [address]    Culture (A)  Final    >=100,000 COLONIES/mL ESCHERICHIA COLI Confirmed Extended Spectrum Beta-Lactamase Producer (ESBL).  In bloodstream infections from ESBL organisms, carbapenems are preferred over piperacillin/tazobactam. They are shown to have a lower risk of mortality.    Report Status 12/30/2020 FINAL  Final   Organism ID, Bacteria ESCHERICHIA COLI (A)  Final      Susceptibility   Escherichia coli - MIC*    AMPICILLIN >=32 RESISTANT Resistant     CEFAZOLIN >=64 RESISTANT Resistant     CEFEPIME >=32 RESISTANT Resistant     CEFTRIAXONE >=64 RESISTANT Resistant     CIPROFLOXACIN >=4 RESISTANT Resistant     GENTAMICIN >=16 RESISTANT  Resistant     IMIPENEM <=0.25 SENSITIVE Sensitive     NITROFURANTOIN <=16 SENSITIVE Sensitive     TRIMETH/SULFA >=320 RESISTANT Resistant     AMPICILLIN/SULBACTAM >=32 RESISTANT Resistant     PIP/TAZO 8 SENSITIVE Sensitive     * >=100,000 COLONIES/mL ESCHERICHIA COLI  Resp Panel by RT-PCR (Flu A&B, Covid) Nasopharyngeal Swab     Status: None   Collection Time: 12/28/20  1:26 PM   Specimen: Nasopharyngeal Swab; Nasopharyngeal(NP) swabs in vial transport medium  Result Value Ref Range Status   SARS Coronavirus 2 by RT PCR NEGATIVE NEGATIVE Final    Comment: (NOTE) SARS-CoV-2 target nucleic acids are NOT DETECTED.  The SARS-CoV-2 RNA is generally detectable in upper respiratory specimens during the acute phase of infection. The lowest concentration of SARS-CoV-2 viral copies this assay can detect is 138 copies/mL. A negative result does not preclude SARS-Cov-2 infection and should not be used as the sole basis for treatment or other patient management decisions. A negative result may occur with  improper specimen collection/handling, submission of specimen other than nasopharyngeal swab, presence of viral mutation(s) within the areas targeted by this assay, and inadequate number of viral copies(<138 copies/mL). A negative result must be combined with clinical observations, patient history, and epidemiological information. The expected result is Negative.  Fact Sheet for Patients:  EntrepreneurPulse.com.au  Fact Sheet for Healthcare Providers:  IncredibleEmployment.be  This test is no t yet approved or cleared by the [place] FDA and  has been authorized for detection and/or diagnosis of SARS-CoV-2 by FDA under an Emergency Use Authorization (EUA). This EUA will remain  in effect (meaning this test can be used) for the duration of the COVID-19 declaration under Section 564(b)(1) of the Act, 21 U.S.C.section 360bbb-3(b)(1), unless the  authorization is terminated  or revoked sooner.       Influenza A by PCR NEGATIVE NEGATIVE Final   Influenza B by PCR NEGATIVE NEGATIVE Final    Comment: (NOTE) The Xpert Xpress SARS-CoV-2/FLU/RSV plus assay is intended as an aid in the diagnosis of influenza from Nasopharyngeal swab specimens and should not  be used as a sole basis for treatment. Nasal washings and aspirates are unacceptable for Xpert Xpress SARS-CoV-2/FLU/RSV testing.  Fact Sheet for Patients: EntrepreneurPulse.com.au  Fact Sheet for Healthcare Providers: IncredibleEmployment.be  This test is not yet approved or cleared by the [place] FDA and has been authorized for detection and/or diagnosis of SARS-CoV-2 by FDA under an Emergency Use Authorization (EUA). This EUA will remain in effect (meaning this test can be used) for the duration of the COVID-19 declaration under Section 564(b)(1) of the Act, 21 U.S.C. section 360bbb-3(b)(1), unless the authorization is terminated or revoked.  Performed at [facility], [place] [address], [place], [place] [address]   Surgical pcr screen     Status: None   Collection Time: 12/30/20 11:52 AM   Specimen: Nasal Mucosa; Nasal Swab  Result Value Ref Range Status   MRSA, PCR NEGATIVE NEGATIVE Final   Staphylococcus aureus NEGATIVE NEGATIVE Final    Comment: (NOTE) The Xpert SA Assay (FDA approved for NASAL specimens in patients 62 years of age and older), is one component of a comprehensive surveillance program. It is not intended to diagnose infection nor to guide or monitor treatment. Performed at [facility], [place] [address] [place], [place] [address]        Radiology Studies: No results found.  Scheduled Meds:  budesonide  0.25 mg Nebulization BID   enoxaparin (LOVENOX) injection  60 mg Subcutaneous Q24H   pantoprazole (PROTONIX) IV  40 mg Intravenous Q24H   Continuous Infusions:   sodium chloride 75 mL/hr at 12/31/20 0715   potassium chloride       LOS: 3 days   Time spent: 28 minutes   Darliss Cheney, MD [facility]  12/31/2020, 11:52 AM   How to contact the [facility] Attending or Consulting provider [place] or covering provider during after hours [place], for this patient?  Check the care team in [facility] and look for a) attending/consulting [facility] provider listed and b) the [facility] team listed. Page or secure chat [phone]. Log into [email or web address] and use [place]'s universal password to access. If you do not have the password, please contact the hospital operator. Locate the [facility] provider you are looking for under [facility] and page to a number that you can be directly reached. If you still have difficulty reaching the provider, please page the [facility] (Director on Call) for the Hospitalists listed on amion for assistance.

## 2020-12-31 NOTE — Progress Notes (Signed)
1 Day Post-Op    CC:  SBO  Subjective: Patient is doing well this a.m. she has had full liquids and tolerated well.  She is passing some gas but no BM for the last 4 days.  Port sites all look fine.  She says she has been up and walked some.  Objective: Vital signs in last 24 hours: Temp:  [96.6 F (35.9 C)-98.7 F (37.1 C)] 98.7 F (37.1 C) (07/13 0945) Pulse Rate:  [57-70] 57 (07/13 0945) Resp:  [16-28] 18 (07/13 0945) BP: (138-187)/(64-82) 146/66 (07/13 0945) SpO2:  [92 %-98 %] 93 % (07/13 0945) Weight:  [117.9 kg] 117.9 kg (07/12 1259) Last BM Date: 12/28/20 320 p.o. 3100 IV Voided x6 No BM Afebrile vital signs are stable. BMP is normal except for glucose of 122/calcium 8.6 CBC is stable Intake/Output from previous day: 07/12 0701 - 07/13 0700 In: 2715.9 [P.O.:320; I.V.:2295.9; IV Piggyback:100] Out: -  Intake/Output this shift: Total I/O In: 200 [P.O.:200] Out: -   General appearance: alert, cooperative, and no distress Resp: clear to auscultation bilaterally GI: Soft, normal postop soreness from the port sites and her right side.  She is trying to control pain with Tylenol currently.  Tolerating liquids and will go up to a soft diet for lunch.  Lab Results:  Recent Labs    12/30/20 0318 12/31/20 0317  WBC 4.2 8.1  HGB 12.4 12.8  HCT 39.6 39.9  PLT 239 255    BMET Recent Labs    12/30/20 0318 12/31/20 0317  NA 142 137  K 3.3* 4.1  CL 108 103  CO2 25 24  GLUCOSE 84 122*  BUN 9 10  CREATININE 0.55 0.61  CALCIUM 8.3* 8.6*   PT/INR No results for input(s): LABPROT, INR in the last 72 hours.  Recent Labs  Lab 12/28/20 1057 12/29/20 0323  AST 24 23  ALT 20 16  ALKPHOS 55 42  BILITOT 1.1 0.9  PROT 7.6 6.0*  ALBUMIN 4.3 3.3*     Lipase     Component Value Date/Time   LIPASE 30 12/28/2020 1057     Medications:  budesonide  0.25 mg Nebulization BID   enoxaparin (LOVENOX) injection  60 mg Subcutaneous Q24H   pantoprazole (PROTONIX) IV   40 mg Intravenous Q24H    Assessment/Plan  Partial small bowel obstruction with probable peritoneal carcinomatosis Diagnostic laparoscopy with abdominal wall biopsy 12/30/2020 Dr. Lurena Joiner Kinsinger, POD #1   FEN: Full liquids with orders to advance to a soft diet later today.  IVF VTE: SCDs, plavix on hold ID: Ancef pre-op   HTN HLD Chronic interstitial cystitis GERD Hx of coronary artery aneurysm on plavix - LD 7/9 Morbid obesity - BMI 43.27  Plan: Advance her diet to give her as needed glycerin suppositories.  She has not had a bowel movement for 4 days.  If she does well and is tolerating a soft diet she can go home tomorrow.  Pathology is pending.  She wants to follow-up with oncology at [facility].  She is already called and started looking for an appointment.  I told her once we have the pathology back we will be glad to refer her, and do what ever we could to expedite her request.  LOS: 3 days    Brittany Middleton 12/31/2020 Please see Amion

## 2021-01-01 ENCOUNTER — Other Ambulatory Visit: Payer: Self-pay | Admitting: Hematology and Oncology

## 2021-01-01 ENCOUNTER — Inpatient Hospital Stay (HOSPITAL_COMMUNITY): Payer: Medicare Other

## 2021-01-01 ENCOUNTER — Encounter (HOSPITAL_COMMUNITY): Payer: Self-pay | Admitting: General Surgery

## 2021-01-01 ENCOUNTER — Telehealth: Payer: Self-pay | Admitting: Oncology

## 2021-01-01 DIAGNOSIS — R194 Change in bowel habit: Secondary | ICD-10-CM

## 2021-01-01 DIAGNOSIS — R140 Abdominal distension (gaseous): Secondary | ICD-10-CM | POA: Diagnosis not present

## 2021-01-01 DIAGNOSIS — C762 Malignant neoplasm of abdomen: Secondary | ICD-10-CM

## 2021-01-01 DIAGNOSIS — C801 Malignant (primary) neoplasm, unspecified: Secondary | ICD-10-CM

## 2021-01-01 DIAGNOSIS — C786 Secondary malignant neoplasm of retroperitoneum and peritoneum: Secondary | ICD-10-CM

## 2021-01-01 DIAGNOSIS — C55 Malignant neoplasm of uterus, part unspecified: Secondary | ICD-10-CM

## 2021-01-01 DIAGNOSIS — C5701 Malignant neoplasm of right fallopian tube: Secondary | ICD-10-CM | POA: Insufficient documentation

## 2021-01-01 HISTORY — DX: Malignant neoplasm of uterus, part unspecified: C55

## 2021-01-01 HISTORY — DX: Secondary malignant neoplasm of retroperitoneum and peritoneum: C78.6

## 2021-01-01 LAB — SURGICAL PATHOLOGY

## 2021-01-01 MED ORDER — DICYCLOMINE HCL 10 MG PO CAPS: 10.0000 mg | ORAL_CAPSULE | Freq: Four times a day (QID) | ORAL | 0 refills | Status: DC | PRN

## 2021-01-01 MED ORDER — ONDANSETRON 4 MG PO TBDP: 4.0000 mg | ORAL_TABLET | Freq: Three times a day (TID) | ORAL | 0 refills | Status: DC | PRN

## 2021-01-01 MED ORDER — OMEPRAZOLE 40 MG PO CPDR: 40.0000 mg | DELAYED_RELEASE_CAPSULE | Freq: Every day | ORAL | 0 refills | Status: DC

## 2021-01-01 NOTE — Discharge Summary (Addendum)
Physician Discharge Summary  Brittany Middleton OIB:[id number] DOB: Feb 06, 1951 DOA: 12/28/2020  PCP: [name], MD  Admit date: 12/28/2020 Discharge date: 01/01/2021 30 Day Unplanned Readmission Risk Score    Flowsheet Row ED to Hosp-Admission (Current) from 12/28/2020 in [place]  30 Day Unplanned Readmission Risk Score (%) 19.88 Filed at 01/01/2021 0400       This score is the patient's risk of an unplanned readmission within 30 days of being discharged (0 -100%). The score is based on dignosis, age, lab data, medications, orders, and past utilization.   Low:  0-14.9   Medium: 15-21.9   High: 22-29.9   Extreme: 30 and above          Admitted From: Home Disposition: Home  Recommendations for Outpatient Follow-up:  Follow up with PCP in 1-2 weeks Follow-up with oncology to initiate care. General surgery should follow-up with you once the pathology results are back, if you do not hear from general surgery, please contact him Please obtain BMP/CBC in one week Please follow up with your PCP on the following pending results: Unresulted Labs (From admission, onward)    None         Home Health: None Equipment/Devices: None  Discharge Condition: Stable CODE STATUS: Full code Diet recommendation: Soft and then advance to regular as tolerated  Subjective: Seen and examined.  Feels well.  Continues to pass flatus.  No nausea.  No abdominal pain.  Tolerating soft diet.  Ready to go home.  Brief/Interim Summary: Brittany Middleton is a 70 y.o. female with medical history significant of HTN, HLD, chronic interstitial cystitis presented with abdominal pain for 2 weeks aggravated with food intake.  She spoke with her GI doc who prescribed some unspecified medications. However, they didn't help.  She also developed some constipation.  She decided to come to the ED for help.  Upon arrival to ED, she was hemodynamically stable however CT showed a  thickening of the omentum that was concerning for peritoneal carcinomatosis and concerning for SBO. [facility] was consulted.  Patient admitted under hospitalist service, initially managed conservatively and subsequently underwent diagnostic laparoscopy with abdominal wall biopsy by Dr. Kieth Brightly on 12/30/2020.  Postoperatively, she was started on clear liquid diet and advance to soft diet and she has tolerated well without having any abdominal pain or nausea.  She has been passing flatus but no bowel movement yet.  She has been walking around and feels comfortable going home.  She has been cleared by general surgery as well.  Her pathology results are not back yet.  General surgery will reach out to her once pathology results are back.  Patient prefers to initiate her oncology care at [facility] where she seeks further medical care.  So far CA 19-9 and CEA negative however CA125 elevated indicating possible primary of ovarian cancer.  She is being discharged in stable condition.  Addendum 4:46 PM: While I was reviewing my list of patients, I saw the patient was still in the hospital.  I have reviewed the chart.  Found out that patient was seen by GYN, oncologist as well as medical oncology team.  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  Dr. Kieth Brightly went back to see the patient and discussed with her and advised her that she gets oncology oncology consultation before she goes.  Now the plan is for her to get pelvic ultrasound and likely start chemotherapy.  Although she is scheduled to have a Chemo-Port as well as chemotherapy to be started on 01/12/2021 with Dr. [name] however per patient, she is not sure if she would like to start her oncology care or would still like to go to [facility].  Patient still plans to go home after ultrasound is completed.  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   albuterol (2.5 MG/3ML) 0.083% nebulizer solution Commonly known as: PROVENTIL Take 2.5 mg by nebulization every 6 (six) hours as needed for wheezing or shortness of breath.   albuterol 108 (90 Base) MCG/ACT inhaler Commonly known as: VENTOLIN HFA Inhale 2 puffs into the lungs every 6 (six) hours as needed for wheezing or shortness of breath.   BIOTIN PO Take 1 tablet by mouth daily.   clopidogrel 75 MG tablet Commonly known as: PLAVIX Take 1 tablet (75 mg total) by mouth daily. What changed: when to take this   co-enzyme Q-10 30 MG capsule Take 30 mg by mouth daily.   dicyclomine 10 MG capsule Commonly known as: Bentyl Take 1 capsule (10 mg total) by mouth every 6 (six) hours as needed for up to 14 days for spasms (abdominal cramps).   flavoxATE 100 MG tablet Commonly known as: URISPAS Take 100 mg by mouth 3 (three) times daily as needed for pain.   fluticasone 110 MCG/ACT inhaler Commonly known as: FLOVENT HFA Inhale 2 puffs into the lungs 2 (two) times daily.   fluticasone 50 MCG/ACT nasal spray Commonly known as: FLONASE Place 1 spray into both nostrils daily. What changed:  when to take this reasons to take this   hydrochlorothiazide 25  MG tablet Commonly known as: HYDRODIURIL Take 1 tablet (25 mg total) by mouth daily.   hydrocortisone 2.5 % cream Apply 1 application topically daily as needed (forehead).   IRON PO Take 1 tablet by mouth 2 (two) times a week. Monday, Wednesday   losartan 50 MG tablet Commonly known as: COZAAR Take 1 tablet (50 mg total) by mouth daily.   Melatonin 5 MG Chew Chew 5 mg by mouth at bedtime.   MELATONIN PO Take 5 mg by mouth daily at 8 pm.   multivitamin with minerals tablet Take 1 tablet by mouth daily.   ondansetron 4 MG disintegrating tablet Commonly known as: Zofran ODT Take 1 tablet (4 mg total) by mouth every 8 (eight) hours as needed for nausea or vomiting.   pantoprazole 40 MG tablet Commonly known as: PROTONIX Take 1 tablet (40 mg total) by mouth daily.   PREBIOTIC PRODUCT PO Take 2 tablets by mouth daily at 8  pm.   PRESCRIPTION MEDICATION Inhale into the lungs at bedtime. CPAP   PROBIOTIC-10 PO Take 1 capsule by mouth daily.   QUNOL ULTRA COQ10 PO Take 1 capsule by mouth daily.   rosuvastatin 20 MG tablet Commonly known as: CRESTOR TAKE ONE TABLET BY MOUTH ONE TIME DAILY   Vitamin D-3 25 MCG (1000 UT) Caps Take 1,000 Units by mouth daily.        Follow-up Information     [name], MD Follow up in 1 week(s).   Specialty: Internal Medicine Contact information: [place] [address] [phone]         RevankarReita Cliche, MD .   Specialty: Cardiology Contact information: [place]  [place] [address] [phone]         Kinsinger, Arta Bruce, MD Follow up on 01/14/2021.   Specialty: General Surgery Why: Your appointment is at 9:30 AM.  Be at the office wo minutes early for check in.  Bring photo ID and insurance information with you. Contact information: [address] [place] [address] [phone]                Allergies  Allergen Reactions   Macrobid  [Nitrofurantoin] Shortness Of Breath    Short of breath, cough, myalgia   Ciprofloxacin Other (See Comments)    Caused PAIN IN HANDS    Codeine Nausea And Vomiting    Consultations: General surgery   Procedures/Studies: CT CHEST WO CONTRAST  Result Date: 12/28/2020 CLINICAL DATA:  Acute abdominal pain.  Shortness of breath. EXAM: CT CHEST, ABDOMEN, AND PELVIS WITH CONTRAST TECHNIQUE: Multidetector CT imaging of the chest, abdomen and pelvis was performed following the standard protocol during bolus administration of intravenous contrast. CONTRAST:  62mL OMNIPAQUE IOHEXOL 300 MG/ML  SOLN COMPARISON:  Chest CT dated 04/27/2020. CT abdomen dated 05/21/2020. FINDINGS: CT CHEST FINDINGS Cardiovascular: No thoracic aortic aneurysm. No pericardial effusion. Scattered coronary artery calcifications. Mild aortic atherosclerosis. 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 Mediastinum/Nodes: No mass or enlarged lymph nodes within the mediastinum or perihilar regions. Esophagus appears normal. Trachea and central bronchi are unremarkable. Lungs/Pleura: Small chronic pleural effusions with associated atelectasis. Lungs otherwise clear. No pneumothorax. Musculoskeletal: Mild degenerative spondylosis of the kyphotic thoracic spine. No acute-appearing osseous abnormality. CT ABDOMEN PELVIS FINDINGS Hepatobiliary: No focal liver abnormality is seen. Gallbladder is distended but otherwise unremarkable. No bile duct dilatation is seen. Pancreas: Unremarkable. No pancreatic ductal dilatation or surrounding inflammatory changes. Spleen: Normal in size without focal abnormality. Adrenals/Urinary Tract: Adrenal glands appear normal. Kidneys are unremarkable without suspicious mass, stone or hydronephrosis. No obstructing ureteral stone. Bladder is unremarkable, partially decompressed. Stomach/Bowel: No dilated large or small bowel loops. Extensive diverticulosis of the  transverse, descending and sigmoid colon but no focal inflammatory change to suggest acute diverticulitis. Mildly distended small bowel loops throughout the abdomen and pelvis, with fluid and associated air-fluid levels throughout. Stomach is unremarkable, partially decompressed. Vascular/Lymphatic: No acute-appearing vascular abnormality. Mild aortic atherosclerosis. No enlarged lymph nodes are seen in the abdomen or pelvis. Reproductive: Uterus and bilateral adnexa are unremarkable. Other: Fluid stranding and nodular thickening of the anterior mesentery and central mesentery, most suggestive of omental caking suggesting neoplastic process (peritoneal carcinomatosis. Small amount of free fluid adjacent to the liver and spleen. No circumscribed fluid collection or abscess-like collection is identified. Musculoskeletal: Degenerative spondylosis of the lumbar spine, moderate in degree. No acute or suspicious  osseous abnormality. (Lucent lesion within the medial aspect of the LEFT iliac bone, of uncertain significance, possibly a small benign bone island or degenerative subchondral cyst. No additional similar-appearing lesions within the axial skeleton or osseous pelvis. IMPRESSION: 1. 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 Differential for peritoneal carcinomatosis is primarily neoplastic and includes cancers of the ovary, appendix, colon, pancreas and stomach. PET-CT may be helpful for further characterization. Tissue sampling of the mesentery may be eventually required for diagnosis. 2. Mildly distended small bowel loops throughout the abdomen and pelvis, with fluid and associated air-fluid levels throughout the nondistended small bowel, suggesting a mild ileus versus partial small bowel obstruction. Favor partial small bowel obstruction with transition zone  in the RIGHT lower quadrant, likely related to aforementioned neoplastic peritoneal implants and/or adhesions. 3. Small free fluid within the RIGHT upper quadrant and LEFT upper quadrant. No abscess collection seen. No free intraperitoneal air seen. 4. Extensive colonic diverticulosis without evidence of acute diverticulitis. 5. Small chronic pleural effusions with associated atelectasis. Aortic Atherosclerosis (ICD10-I70.0). These results were called by telephone at the time of interpretation on 12/28/2020 at 12:27 pm to provider [facility] , who verbally acknowledged these results. Electronically Signed   By: Franki Cabot M.D.   On: 12/28/2020 12:28   CT ABDOMEN PELVIS W CONTRAST  Result Date: 12/28/2020 CLINICAL DATA:  Acute abdominal pain.  Shortness of breath. EXAM: CT CHEST, ABDOMEN, AND PELVIS WITH CONTRAST TECHNIQUE: Multidetector CT imaging of the chest, abdomen and pelvis was performed following the standard protocol during bolus administration of intravenous contrast. CONTRAST:  90mL OMNIPAQUE IOHEXOL 300 MG/ML  SOLN COMPARISON:  Chest CT dated 04/27/2020. CT abdomen dated 05/21/2020. FINDINGS: CT CHEST FINDINGS Cardiovascular: No thoracic aortic aneurysm. No pericardial effusion. Scattered coronary artery calcifications. Mild aortic atherosclerosis. 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 Mediastinum/Nodes: No mass or enlarged lymph nodes within the mediastinum or perihilar regions. Esophagus appears normal. Trachea and central bronchi are unremarkable. Lungs/Pleura: Small chronic pleural effusions with associated atelectasis. Lungs otherwise clear. No pneumothorax. Musculoskeletal: Mild degenerative spondylosis of the kyphotic thoracic spine. No acute-appearing osseous abnormality. CT ABDOMEN PELVIS FINDINGS Hepatobiliary: No focal liver abnormality is seen. Gallbladder is distended but otherwise unremarkable. No bile duct dilatation is  seen. Pancreas: Unremarkable. No pancreatic ductal dilatation or surrounding inflammatory changes. Spleen: Normal in size without focal abnormality. Adrenals/Urinary Tract: Adrenal glands appear normal. Kidneys are unremarkable without suspicious mass, stone or hydronephrosis. No obstructing ureteral stone. Bladder is unremarkable, partially decompressed. Stomach/Bowel: No dilated large or small bowel loops. Extensive diverticulosis of the transverse, descending and sigmoid colon but no focal inflammatory change to suggest acute diverticulitis. Mildly distended small bowel loops throughout the abdomen and pelvis, with fluid and associated air-fluid levels throughout. Stomach is unremarkable, partially decompressed. Vascular/Lymphatic: No acute-appearing vascular abnormality. Mild aortic atherosclerosis. No enlarged lymph nodes are seen in the abdomen or pelvis. Reproductive: Uterus and bilateral adnexa are unremarkable. Other: Fluid stranding and nodular thickening of the anterior mesentery and central mesentery, most suggestive of omental caking suggesting neoplastic process (peritoneal carcinomatosis. Small amount of free fluid adjacent to the liver and spleen. No circumscribed fluid collection or abscess-like collection is identified. Musculoskeletal: Degenerative spondylosis of the lumbar spine, moderate in degree. No acute or suspicious osseous abnormality. (Lucent lesion within the medial aspect of the LEFT iliac bone, of uncertain significance, possibly a small  benign bone island or degenerative subchondral cyst. No additional similar-appearing lesions within the axial skeleton or osseous pelvis. IMPRESSION: 1. 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 Differential for peritoneal carcinomatosis is primarily neoplastic and includes cancers of the ovary,  appendix, colon, pancreas and stomach. PET-CT may be helpful for further characterization. Tissue sampling of the mesentery may be eventually required for diagnosis. 2. Mildly distended small bowel loops throughout the abdomen and pelvis, with fluid and associated air-fluid levels throughout the nondistended small bowel, suggesting a mild ileus versus partial small bowel obstruction. Favor partial small bowel obstruction with transition zone in the RIGHT lower quadrant, likely related to aforementioned neoplastic peritoneal implants and/or adhesions. 3. Small free fluid within the RIGHT upper quadrant and LEFT upper quadrant. No abscess collection seen. No free intraperitoneal air seen. 4. Extensive colonic diverticulosis without evidence of acute diverticulitis. 5. Small chronic pleural effusions with associated atelectasis. Aortic Atherosclerosis (ICD10-I70.0). These results were called by telephone at the time of interpretation on 12/28/2020 at 12:27 pm to provider [facility] , who verbally acknowledged these results. Electronically Signed   By: Franki Cabot M.D.   On: 12/28/2020 12:28   DG Chest Portable 1 View  Result Date: 12/28/2020 CLINICAL DATA:  Severe abdominal pain. Clinical concern for free peritoneal air. EXAM: PORTABLE CHEST 1 VIEW COMPARISON:  05/21/2020 FINDINGS: Normal sized heart. Clear lungs. No free peritoneal air seen. Thoracic spine degenerative changes. IMPRESSION: No free peritoneal air or other acute abnormality. Electronically Signed   By: Claudie Revering M.D.   On: 12/28/2020 11:58     Discharge Exam: Vitals:   12/31/20 2226 01/01/21 0615  BP: (!) 151/71 (!) 167/76  Pulse: 60 (!) 57  Resp: 16 16  Temp: 98.2 F (36.8 C) 98.3 F (36.8 C)  SpO2: 96% 97%   Vitals:   12/31/20 1328 12/31/20 1453 12/31/20 2226 01/01/21 0615  BP: (!) 122/51  (!) 151/71 (!) 167/76  Pulse: 68  60 (!) 57  Resp: 16  16 16   Temp: 98.1 F (36.7 C)  98.2 F (36.8 C) 98.3 F (36.8 C)  TempSrc:  Oral  Oral Oral  SpO2: 94% 94% 96% 97%  Weight:      Height:        General: Pt is alert, awake, not in acute distress Cardiovascular: RRR, S1/S2 +, no rubs, no gallops Respiratory: CTA bilaterally, no wheezing, no rhonchi Abdominal: Soft, NT, ND, bowel sounds + Extremities: no edema, no cyanosis    The results of significant diagnostics from this hospitalization (including imaging, microbiology, ancillary and laboratory) are listed below for reference.     Microbiology: Recent Results (from the past 240 hour(s))  Urine Culture     Status: Abnormal   Collection Time: 12/28/20 10:57 AM   Specimen: Urine, Random  Result Value Ref Range Status   Specimen Description   Final    URINE, RANDOM Performed at [facility] [address] [place], [place] [address]    Special Requests   Final    NONE Performed at [facility], [place] [address] [place], [place] [address]    Culture (A)  Final    >=100,000 COLONIES/mL ESCHERICHIA COLI Confirmed Extended Spectrum Beta-Lactamase Producer (ESBL).  In bloodstream infections from ESBL organisms, carbapenems are preferred over piperacillin/tazobactam. They are shown to have a lower risk of mortality.    Report Status  12/30/2020 FINAL  Final   Organism ID, Bacteria ESCHERICHIA COLI (A)  Final      Susceptibility   Escherichia coli - MIC*    AMPICILLIN >=32 RESISTANT Resistant     CEFAZOLIN >=64 RESISTANT Resistant     CEFEPIME >=32 RESISTANT Resistant     CEFTRIAXONE >=64 RESISTANT Resistant     CIPROFLOXACIN >=4 RESISTANT Resistant     GENTAMICIN >=16 RESISTANT Resistant     IMIPENEM <=0.25 SENSITIVE Sensitive     NITROFURANTOIN <=16 SENSITIVE Sensitive     TRIMETH/SULFA >=320 RESISTANT Resistant     AMPICILLIN/SULBACTAM >=32 RESISTANT Resistant     PIP/TAZO 8 SENSITIVE Sensitive     * >=100,000 COLONIES/mL ESCHERICHIA COLI  Resp Panel by RT-PCR (Flu A&B, Covid) Nasopharyngeal Swab     Status:  None   Collection Time: 12/28/20  1:26 PM   Specimen: Nasopharyngeal Swab; Nasopharyngeal(NP) swabs in vial transport medium  Result Value Ref Range Status   SARS Coronavirus 2 by RT PCR NEGATIVE NEGATIVE Final    Comment: (NOTE) SARS-CoV-2 target nucleic acids are NOT DETECTED.  The SARS-CoV-2 RNA is generally detectable in upper respiratory specimens during the acute phase of infection. The lowest concentration of SARS-CoV-2 viral copies this assay can detect is 138 copies/mL. A negative result does not preclude SARS-Cov-2 infection and should not be used as the sole basis for treatment or other patient management decisions. A negative result may occur with  improper specimen collection/handling, submission of specimen other than nasopharyngeal swab, presence of viral mutation(s) within the areas targeted by this assay, and inadequate number of viral copies(<138 copies/mL). A negative result must be combined with clinical observations, patient history, and epidemiological information. The expected result is Negative.  Fact Sheet for Patients:  EntrepreneurPulse.com.au  Fact Sheet for Healthcare Providers:  IncredibleEmployment.be  This test is no t yet approved or cleared by the [place] FDA and  has been authorized for detection and/or diagnosis of SARS-CoV-2 by FDA under an Emergency Use Authorization (EUA). This EUA will remain  in effect (meaning this test can be used) for the duration of the COVID-19 declaration under Section 564(b)(1) of the Act, 21 U.S.C.section 360bbb-3(b)(1), unless the authorization is terminated  or revoked sooner.       Influenza A by PCR NEGATIVE NEGATIVE Final   Influenza B by PCR NEGATIVE NEGATIVE Final    Comment: (NOTE) The Xpert Xpress SARS-CoV-2/FLU/RSV plus assay is intended as an aid in the diagnosis of influenza from Nasopharyngeal swab specimens and should not be used as a sole basis for  treatment. Nasal washings and aspirates are unacceptable for Xpert Xpress SARS-CoV-2/FLU/RSV testing.  Fact Sheet for Patients: EntrepreneurPulse.com.au  Fact Sheet for Healthcare Providers: IncredibleEmployment.be  This test is not yet approved or cleared by the [place] FDA and has been authorized for detection and/or diagnosis of SARS-CoV-2 by FDA under an Emergency Use Authorization (EUA). This EUA will remain in effect (meaning this test can be used) for the duration of the COVID-19 declaration under Section 564(b)(1) of the Act, 21 U.S.C. section 360bbb-3(b)(1), unless the authorization is terminated or revoked.  Performed at [facility], [place] [address], [place], [place] [address]   Surgical pcr screen     Status: None   Collection Time: 12/30/20 11:52 AM   Specimen: Nasal Mucosa; Nasal Swab  Result Value Ref Range Status   MRSA, PCR NEGATIVE NEGATIVE Final   Staphylococcus aureus NEGATIVE NEGATIVE Final    Comment: (NOTE) The Xpert  SA Assay (FDA approved for NASAL specimens in patients 26 years of age and older), is one component of a comprehensive surveillance program. It is not intended to diagnose infection nor to guide or monitor treatment. Performed at [facility], [place] [address], [place], [place] [address]      Labs: BNP (last 3 results) Recent Labs    03/30/20 1708 04/26/20 0532 05/21/20 1545  BNP 95.5 124.2* 623.7*   Basic Metabolic Panel: Recent Labs  Lab 12/28/20 1057 12/28/20 1549 12/29/20 0323 12/30/20 0318 12/31/20 0317  NA 138  --  139 142 137  K 3.4*  --  3.7 3.3* 4.1  CL 100  --  106 108 103  CO2 30  --  27 25 24   GLUCOSE 115*  --  97 84 122*  BUN 8  --  9 9 10   CREATININE 0.67  --  0.58 0.55 0.61  CALCIUM 9.1  --  8.3* 8.3* 8.6*  MG  --  2.0  --   --  1.8   Liver Function Tests: Recent Labs  Lab 12/28/20 1057 12/29/20 0323  AST 24 23  ALT 20 16   ALKPHOS 55 42  BILITOT 1.1 0.9  PROT 7.6 6.0*  ALBUMIN 4.3 3.3*   Recent Labs  Lab 12/28/20 1057  LIPASE 30   No results for input(s): AMMONIA in the last 168 hours. CBC: Recent Labs  Lab 12/28/20 1057 12/29/20 0323 12/30/20 0318 12/31/20 0317  WBC 5.5 3.2* 4.2 8.1  NEUTROABS 3.9  --  2.6 7.4  HGB 14.2 12.4 12.4 12.8  HCT 44.1 38.6 39.6 39.9  MCV 88.2 89.6 89.4 88.5  PLT 274 223 239 255   Cardiac Enzymes: No results for input(s): CKTOTAL, CKMB, CKMBINDEX, TROPONINI in the last 168 hours. BNP: Invalid input(s): POCBNP CBG: No results for input(s): GLUCAP in the last 168 hours. D-Dimer No results for input(s): DDIMER in the last 72 hours. Hgb A1c No results for input(s): HGBA1C in the last 72 hours. Lipid Profile No results for input(s): CHOL, HDL, LDLCALC, TRIG, CHOLHDL, LDLDIRECT in the last 72 hours. Thyroid function studies No results for input(s): TSH, T4TOTAL, T3FREE, THYROIDAB in the last 72 hours.  Invalid input(s): FREET3 Anemia work up No results for input(s): VITAMINB12, FOLATE, FERRITIN, TIBC, IRON, RETICCTPCT in the last 72 hours. Urinalysis    Component Value Date/Time   COLORURINE AMBER (A) 12/28/2020 1057   APPEARANCEUR CLOUDY (A) 12/28/2020 1057   APPEARANCEUR Clear 05/20/2020 1422   LABSPEC 1.025 12/28/2020 1057   PHURINE 6.0 12/28/2020 1057   GLUCOSEU NEGATIVE 12/28/2020 1057   HGBUR SMALL (A) 12/28/2020 1057   BILIRUBINUR SMALL (A) 12/28/2020 1057   BILIRUBINUR Negative 05/20/2020 1422   KETONESUR NEGATIVE 12/28/2020 1057   PROTEINUR 100 (A) 12/28/2020 1057   UROBILINOGEN 1.0 10/31/2014 1646   NITRITE NEGATIVE 12/28/2020 1057   LEUKOCYTESUR MODERATE (A) 12/28/2020 1057   Sepsis Labs Invalid input(s): PROCALCITONIN,  WBC,  LACTICIDVEN Microbiology Recent Results (from the past 240 hour(s))  Urine Culture     Status: Abnormal   Collection Time: 12/28/20 10:57 AM   Specimen: Urine, Random  Result Value Ref Range Status   Specimen  Description   Final    URINE, RANDOM Performed at [facility], [place] [address], [place], [place] [address]    Special Requests   Final    NONE Performed at [facility], [place] [address] [place], [place] [address]    Culture (A)  Final    >=100,000 COLONIES/mL ESCHERICHIA COLI Confirmed Extended Spectrum Beta-Lactamase Producer (ESBL).  In bloodstream infections from ESBL organisms, carbapenems are preferred over piperacillin/tazobactam. They are shown to have a lower risk of mortality.    Report Status 12/30/2020 FINAL  Final   Organism ID, Bacteria ESCHERICHIA COLI (A)  Final      Susceptibility   Escherichia coli - MIC*    AMPICILLIN >=32 RESISTANT Resistant     CEFAZOLIN >=64 RESISTANT Resistant     CEFEPIME >=32 RESISTANT Resistant     CEFTRIAXONE >=64 RESISTANT Resistant     CIPROFLOXACIN >=4 RESISTANT Resistant     GENTAMICIN >=16 RESISTANT Resistant     IMIPENEM <=0.25 SENSITIVE Sensitive     NITROFURANTOIN <=16 SENSITIVE Sensitive     TRIMETH/SULFA >=320 RESISTANT Resistant     AMPICILLIN/SULBACTAM >=32 RESISTANT Resistant     PIP/TAZO 8 SENSITIVE Sensitive     * >=100,000 COLONIES/mL ESCHERICHIA COLI  Resp Panel by RT-PCR (Flu A&B, Covid) Nasopharyngeal Swab     Status: None   Collection Time: 12/28/20  1:26 PM   Specimen: Nasopharyngeal Swab; Nasopharyngeal(NP) swabs in vial transport medium  Result Value Ref Range Status   SARS Coronavirus 2 by RT PCR NEGATIVE NEGATIVE Final    Comment: (NOTE) SARS-CoV-2 target nucleic acids are NOT DETECTED.  The SARS-CoV-2 RNA is generally detectable in upper respiratory specimens during the acute phase of infection. The lowest concentration of SARS-CoV-2 viral copies this assay can detect is 138 copies/mL. A negative result does not preclude SARS-Cov-2 infection and should not be used as the sole basis for treatment or other patient management decisions. A negative result may occur with   improper specimen collection/handling, submission of specimen other than nasopharyngeal swab, presence of viral mutation(s) within the areas targeted by this assay, and inadequate number of viral copies(<138 copies/mL). A negative result must be combined with clinical observations, patient history, and epidemiological information. The expected result is Negative.  Fact Sheet for Patients:  EntrepreneurPulse.com.au  Fact Sheet for Healthcare Providers:  IncredibleEmployment.be  This test is no t yet approved or cleared by the [place] FDA and  has been authorized for detection and/or diagnosis of SARS-CoV-2 by FDA under an Emergency Use Authorization (EUA). This EUA will remain  in effect (meaning this test can be used) for the duration of the COVID-19 declaration under Section 564(b)(1) of the Act, 21 U.S.C.section 360bbb-3(b)(1), unless the authorization is terminated  or revoked sooner.       Influenza A by PCR NEGATIVE NEGATIVE Final   Influenza B by PCR NEGATIVE NEGATIVE Final    Comment: (NOTE) The Xpert Xpress SARS-CoV-2/FLU/RSV plus assay is intended as an aid in the diagnosis of influenza from Nasopharyngeal swab specimens and should not be used as a sole basis for treatment. Nasal washings and aspirates are unacceptable for Xpert Xpress SARS-CoV-2/FLU/RSV testing.  Fact Sheet for Patients: EntrepreneurPulse.com.au  Fact Sheet for Healthcare Providers: IncredibleEmployment.be  This test is not yet approved or cleared by the [place] FDA and has been authorized for detection and/or diagnosis of SARS-CoV-2 by FDA under an Emergency Use Authorization (EUA). This EUA will remain in effect (meaning this test can be used) for the duration of the COVID-19 declaration under Section 564(b)(1) of the Act, 21 U.S.C. section 360bbb-3(b)(1), unless the authorization is terminated  or revoked.  Performed at [facility], [place] [address] [place], [place] [address]   Surgical pcr screen     Status: None  Collection Time: 12/30/20 11:52 AM   Specimen: Nasal Mucosa; Nasal Swab  Result Value Ref Range Status   MRSA, PCR NEGATIVE NEGATIVE Final   Staphylococcus aureus NEGATIVE NEGATIVE Final    Comment: (NOTE) The Xpert SA Assay (FDA approved for NASAL specimens in patients 20 years of age and older), is one component of a comprehensive surveillance program. It is not intended to diagnose infection nor to guide or monitor treatment. Performed at [facility], [place] [address], [place], [place] [address]      Time coordinating discharge: Over 30 minutes  SIGNED:   Darliss Cheney, MD  [facility] 01/01/2021, 11:05 AM  If 7PM-7AM, please contact night-coverage [email or web address]

## 2021-01-01 NOTE — Treatment Plan (Signed)
GYNECOLOGIC ONCOLOGY BRIEF NOTE  Briefly, I was asked by Dr. 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  She presented earlier this week with 2 weeks of abdominal symptoms and GI dysfunction.  While in the emergency department, she underwent CT scan showing evidence of peritoneal carcinomatosis and possible small bowel obstruction.  She was admitted and was taken for diagnostic laparoscopy and biopsy of an abdominal wall lesion on 7/12.  Findings Intra-Op were extensive peritoneal and mesenteric carcinomatosis as well as a left upper quadrant abdominal mass.  Tumor markers were obtained and both CEA and CA 19-9 were negative however CA-125 was mildly elevated at 95.  She has done well since surgery and has been able to advance her diet to a regular diet without nausea or emesis and is reporting bowel function in the form of flatus.  She was scheduled for discharge today.  I recommended a pelvic ultrasound to better characterize her GYN organs, which look unremarkable on CT scan.  Given the extent of disease with her only mildly elevated CA-125, this could signal either a non-ovarian primary (such as metastatic uterine cancer) or low-grade disease.   I spoke with Dr. [name] to help facilitate initiating the patient's cancer treatment.  Given her extent of disease both by imaging as well as diagnostic laparoscopy, abdominal cytoreduction would not be possible at this time.  She is an excellent candidate for neoadjuvant chemotherapy.    I met with the patient and her son at bedside this afternoon.  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  I briefly discussed the 2 treatment paradigms for advanced gynecologic cancer, specifically relating to ovarian cancer.  We discussed that treatment  is frequently with a combination of surgery as well as chemotherapy.  In the setting of significant disease burden that does not appear to be resectable at the time of presentation, I recommend proceeding with neoadjuvant chemotherapy.  Her pelvic ultrasound will help identify whether her uterus may be the source of this pathology.  I would like to see her for an official consultation in my clinic where I can perform an adequate pelvic exam and any biopsies if needed.  We discussed the typical treatment course, that involves 3 cycles of chemotherapy followed by repeat imaging to assess for response.  Depending on her radiologic response, we will make a decision about the feasibility and appropriateness of surgery at that time.  Our nurse navigator, Santiago Glad, we will schedule the patient to see me for a new patient visit on either Monday or Friday next week and coordination with her chemo education class.  The patient was given my card as well as card of our nurse practitioner, Joylene John.  Jeral Pinch MD Gynecologic Oncology

## 2021-01-01 NOTE — Telephone Encounter (Signed)
Called [facility] and asked if Dr. Kieth Brightly would want to place Brittany Middleton's port or if he would like [place] to arrange it with IR.  The triage nurse will ask him and get back to [place].

## 2021-01-01 NOTE — Telephone Encounter (Signed)
Dr. Kieth Brightly called and said he is going to try to place the port on 7/19.  He will call back if he is not able to schedule it.

## 2021-01-01 NOTE — Plan of Care (Signed)
  Problem: Activity: Goal: Risk for activity intolerance will decrease Outcome: Progressing   Problem: Nutrition: Goal: Adequate nutrition will be maintained Outcome: Progressing   Problem: Coping: Goal: Level of anxiety will decrease Outcome: Progressing   Problem: Pain Managment: Goal: General experience of comfort will improve Outcome: Progressing   

## 2021-01-01 NOTE — Consult Note (Signed)
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 Berline Lopes Explained to the patient the rationale behind neoadjuvant chemotherapy approach  We reviewed the NCCN guidelines We discussed the role of chemotherapy. The intent is of curative intent.  We discussed some of the risks, benefits, side-effects of carboplatin & Taxol. Treatment is intravenous, every 3 weeks x 6 cycles  Some of the short term side-effects included, though not limited to, including weight loss, life threatening infections, risk of allergic reactions, need for transfusions of blood products, nausea, vomiting, change in bowel habits, loss of hair, admission to hospital for various reasons, and risks of death.   I recommend port placement and chemo education class I will set up follow-up appointment next week in the outpatient cancer center for further discussion and final chemo therapy consent Tentatively, I will get her scheduled to start chemotherapy on July 25  Abdominal bloating, changes in bowel habits Due to carcinomatosis We have extensive discussion about the importance of management with gentle laxative  Discharge planning She can be discharged from my perspective I will arrange outpatient follow-up next week  The total time spent in the appointment was 60 minutes encounter with patients including review of chart and various tests results, discussions about plan of care and coordination of care plan   All questions were answered. The patient knows to call the clinic with any problems, questions or concerns.  No barriers to learning was detected.  [name], MD 7/14/20223:23 PM  CHIEF COMPLAINTS/PURPOSE OF CONSULTATION:  Abdominal carcinomatosis, suspect GYN primary  HISTORY OF PRESENTING ILLNESS:  Brittany Middleton 70 y.o. female is seen in the hospital at the request of Dr. Berline Lopes, GYN surgeon  I have reviewed her chart and materials related to her cancer extensively and collaborated history with the patient. 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 - Diverticulosis in the sigmoid colon and in the descending colon. - No specimens collected. - Blood in stool without cause found on endoscopic exam   05/08/2020 Imaging   1. Bladder is decompressed though demonstrates significant perivesicular hazy stranding, mucosal hyperemia and edematous mural thickening. Findings are suggestive of cystitis. Correlate with urinalysis. No abnormal perinephric or periureteral stranding or other features to suggest an ascending tract infection at this time. 2. Colonic diverticulosis without evidence of acute diverticulitis.  3. Aortic  Atherosclerosis (ICD10-I70.0).   12/28/2020 Imaging   1. 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 Differential for peritoneal carcinomatosis is primarily neoplastic and includes cancers of the ovary, appendix, colon, pancreas and stomach. PET-CT may be helpful for further characterization. Tissue sampling of the mesentery may be eventually required for diagnosis. 2. Mildly distended small bowel loops throughout the abdomen and pelvis, with fluid and associated air-fluid levels throughout the nondistended small bowel, suggesting a mild ileus versus partial small bowel obstruction. Favor partial small bowel obstruction with transition zone in the RIGHT lower quadrant, likely related to aforementioned neoplastic peritoneal implants and/or adhesions. 3. Small free fluid within the RIGHT upper quadrant and LEFT upper quadrant. No abscess collection seen. No free intraperitoneal air seen. 4. Extensive colonic diverticulosis without evidence of acute diverticulitis. 5. Small chronic pleural effusions with associated atelectasis.   12/29/2020 Tumor Marker   Patient's tumor was tested for the following markers: CA-125. Results of the tumor marker test revealed 95.1.   12/30/2020 Surgery   Preoperative diagnosis: abdominal nodules   Postoperative diagnosis: same   Procedure: diagnostic laparoscopy with abdominal wall biopsy   Surgeon: Gurney Maxin, M.D.    Indications for procedure: Brittany Middleton is a 70 y.o. year old female with symptoms of nausea, vomiting, and abdominal pain. Work up was concerning for cancer with abdominal wall studding.   Description of procedure: The patient was brought into the operative suite. Anesthesia was administered with General endotracheal anesthesia. WHO checklist was applied. The patient was then placed in  supine position. The area was prepped and draped in the usual sterile fashion.   A small left subcostal incision was made. A 57m trocar was used to gain access to the peritoneal cavity by optical entry technique. Pneumoperitoneum was applied with a high flow and low pressure. The laparoscope was reinserted to confirm position.   On initial visualization of the abdomen, there were multiple nodules throughout the abdominal wall. There were multiple nodules over the small intestine and mesentery. There was some loops of small intestine that were matted together. There was a large white mass in the left upper quadrant. Multiple areas of peritoneum were removed with harmonic scalpel. The large mass was inspected. It appeared to be related to the colon and omentum. Grasper was used to remove some superficial tissue and was sent for culture.   The abdomen was desufflated. The incisions were closed with 4-0 monocryl subcuticular stitch.   12/30/2020 Pathology Results   FINAL MICROSCOPIC DIAGNOSIS:   A. ABDOMINAL WALL, NODULE, EXCISION:  -  Metastatic adenocarcinoma  -  See comment   B. PERI COLONIC EXUDATE, EXCISION:  -  Metastatic adenocarcinoma  -  See comment   COMMENT:   By immunohistochemistry, the neoplastic cells are positive for cytokeratin 7, PAX8 and WT1 but negative for cytokeratin 20, CDX2, p53 and TTF-1.  The morphology and immunophenotype are consistent with a gynecologic primary.     01/01/2021 Initial Diagnosis   Peritoneal carcinomatosis ([place]    01/12/2021 -  Chemotherapy    Patient is on Treatment Plan: OVARIAN CARBOPLATIN (AUC 6) / PACLITAXEL (175) Q21D X 6 CYCLES        She is healing well She still have abdominal discomfort that comes and goes and changes in bowel habits Her 2 sons are present  MEDICAL HISTORY:  Past Medical History:  Diagnosis Date  Acute blood loss anemia 07/17/2018   Anemia 07/17/2018   Ascending aortic aneurysm ([place]) 05/23/2019   Asthma     Cervical disc disease    MRI 2016   Chest pain with moderate risk for cardiac etiology 02/27/2018   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   Gastrointestinal bleeding 07/25/2018   Gastrointestinal hemorrhage with melena 07/16/2018   GERD (gastroesophageal reflux disease)    Hepatic steatosis    per imaging 2016, 2019   Hiatal hernia    History of palpitations    Holter monitor, 2017: NSR, occasional PVC, PACs, no arrhythmia   HLD (hyperlipidemia) 02/27/2018   HTN (hypertension) 02/27/2018   Hypercholesteremia    Hypercholesterolemia 04/07/2017   Hypertension    Hypertensive disorder 04/07/2017   Hypokalemia 07/17/2018   Lumbar disc disease    MRI, 2017    Morbid obesity ([place]) 05/22/2018   Overweight 03/03/2018   Palpitations 08/19/2015   Pyelonephritis 03/30/2020   Sepsis secondary to UTI ([place]) 04/25/2020   Severe sepsis with acute organ dysfunction ([place] [place]) 04/25/2020   Sleep apnea    UTI (urinary tract infection)     SURGICAL HISTORY: Past Surgical History:  Procedure Laterality Date   BLADDER REPAIR     BREAST EXCISIONAL BIOPSY Left    CARPAL TUNNEL RELEASE Right    CATARACT EXTRACTION, BILATERAL  2014   COLONOSCOPY WITH PROPOFOL N/A 07/18/2018   Procedure: COLONOSCOPY WITH PROPOFOL;  Surgeon: Laurence Spates, MD;  Location: [facility];  Service: Endoscopy;  Laterality: N/A;   ESOPHAGOGASTRODUODENOSCOPY (EGD) WITH PROPOFOL N/A 07/17/2018   Procedure: ESOPHAGOGASTRODUODENOSCOPY (EGD) WITH PROPOFOL;  Surgeon: Laurence Spates, MD;  Location: [facility];  Service: Endoscopy;  Laterality: N/A;   LAPAROSCOPY N/A 12/30/2020   Procedure: LAPAROSCOPY DIAGNOSTIC; WITH  BIOPSY OF ABDOMINAL WALL NODULES AND BIOPSY OF PERICOLONIC EXUDATE;  Surgeon: Kinsinger, Arta Bruce, MD;  Location: [facility];  Service: General;  Laterality: N/A;   REPAIR RECTOCELE      SOCIAL HISTORY: Social History   Socioeconomic History   Marital status: Divorced    Spouse name: Not on file   Number of children: 2   Years of  education: Not on file   Highest education level: Master's degree (e.g., MA, MS, MEng, MEd, MSW, MBA)  Occupational History   Occupation: Retired  Tobacco Use   Smoking status: Never   Smokeless tobacco: Never  Vaping Use   Vaping Use: Never used  Substance and Sexual Activity   Alcohol use: No   Drug use: No   Sexual activity: Not on file  Other Topics Concern   Not on file  Social History Narrative   Not on file   Social Determinants of Health   Financial Resource Strain: Not on file  Food Insecurity: Not on file  Transportation Needs: Not on file  Physical Activity: Not on file  Stress: Not on file  Social Connections: Not on file  Intimate Partner Violence: Not on file    FAMILY HISTORY: Family History  Problem Relation Age of Onset   CAD Mother    Breast cancer Mother 86    ALLERGIES:  is allergic to macrobid [nitrofurantoin], ciprofloxacin, and codeine.  MEDICATIONS:  Current Facility-Administered Medications  Medication Dose Route Frequency Provider Last Rate Last Admin   0.9 %  sodium chloride infusion   Intravenous Continuous Norm Parcel, PA-C 75 mL/hr at 12/31/20 2101 New Bag at 12/31/20 2101   acetaminophen (TYLENOL) tablet 650 mg  650 mg Oral Q6H PRN Norm Parcel, PA-C  650 mg at 01/01/21 0347   Or   acetaminophen (TYLENOL) suppository 650 mg  650 mg Rectal Q6H PRN Norm Parcel, PA-C       albuterol (PROVENTIL) (2.5 MG/3ML) 0.083% nebulizer solution 2.5 mg  2.5 mg Nebulization Q6H PRN Norm Parcel, PA-C       budesonide (PULMICORT) nebulizer solution 0.25 mg  0.25 mg Nebulization BID Barkley Boards R, PA-C       dicyclomine (BENTYL) injection 10 mg  10 mg Intramuscular QID PRN Norm Parcel, PA-C   10 mg at 12/30/20 0526   enoxaparin (LOVENOX) injection 60 mg  60 mg Subcutaneous Q24H Norm Parcel, PA-C   60 mg at 01/01/21 0809   fluticasone (FLONASE) 50 MCG/ACT nasal spray 1 spray  1 spray Each Nare Daily PRN Norm Parcel, PA-C        Glycerin (Adult) 2 g suppository 1 suppository  1 suppository Rectal PRN Earnstine Regal, PA-C       hydrALAZINE (APRESOLINE) injection 10 mg  10 mg Intravenous Q8H PRN Barkley Boards R, PA-C       lip balm (CARMEX) ointment 1 application  1 application Topical PRN Darliss Cheney, MD   1 application at 36/62/94 2005   morphine 2 MG/ML injection 2 mg  2 mg Intravenous Q2H PRN Norm Parcel, PA-C   2 mg at 12/31/20 0129   ondansetron (ZOFRAN) tablet 4 mg  4 mg Oral Q6H PRN Norm Parcel, PA-C       Or   ondansetron [facility]) injection 4 mg  4 mg Intravenous Q6H PRN Norm Parcel, PA-C   4 mg at 12/30/20 1816   pantoprazole (PROTONIX) injection 40 mg  40 mg Intravenous Q24H Norm Parcel, PA-C   40 mg at 12/31/20 1719   potassium chloride 10 mEq in 100 mL IVPB  10 mEq Intravenous Once Norm Parcel, PA-C        REVIEW OF SYSTEMS:   Constitutional: Denies fevers, chills or abnormal night sweats Eyes: Denies blurriness of vision, double vision or watery eyes Ears, nose, mouth, throat, and face: Denies mucositis or sore throat Respiratory: Denies cough, dyspnea or wheezes Cardiovascular: Denies palpitation, chest discomfort or lower extremity swelling Skin: Denies abnormal skin rashes Lymphatics: Denies new lymphadenopathy or easy bruising Neurological:Denies numbness, tingling or new weaknesses Behavioral/Psych: Mood is stable, no new changes  All other systems were reviewed with the patient and are negative.  PHYSICAL EXAMINATION: ECOG PERFORMANCE STATUS: 1 - Symptomatic but completely ambulatory  Vitals:   01/01/21 0615 01/01/21 1401  BP: (!) 167/76 (!) 149/78  Pulse: (!) 57 61  Resp: 16 18  Temp: 98.3 F (36.8 C) 98.1 F (36.7 C)  SpO2: 97% 95%   Filed Weights   12/28/20 0938 12/30/20 1259  Weight: 260 lb (117.9 kg) 260 lb (117.9 kg)    GENERAL:alert, no distress and comfortable SKIN: skin color, texture, turgor are normal, no rashes or significant  lesions EYES: normal, conjunctiva are pink and non-injected, sclera clear OROPHARYNX:no exudate, no erythema and lips, buccal mucosa, and tongue normal  NECK: supple, thyroid normal size, non-tender, without nodularity LYMPH:  no palpable lymphadenopathy in the cervical, axillary or inguinal LUNGS: clear to auscultation and percussion with normal breathing effort HEART: regular rate & rhythm and no murmurs and no lower extremity edema ABDOMEN:abdomen soft, non-tender and normal bowel sounds.  Noted well-healed surgical scar Musculoskeletal:no cyanosis of digits and no clubbing  PSYCH: alert & oriented x 3  with fluent speech NEURO: no focal motor/sensory deficits  LABORATORY DATA:  I have reviewed the data as listed Lab Results  Component Value Date   WBC 8.1 12/31/2020   HGB 12.8 12/31/2020   HCT 39.9 12/31/2020   MCV 88.5 12/31/2020   PLT 255 12/31/2020   Recent Labs    02/04/20 1036 03/30/20 1250 05/28/20 1546 05/29/20 0500 12/28/20 1057 12/29/20 0323 12/30/20 0318 12/31/20 0317  NA  --    < > 138   < > 138 139 142 137  K  --    < > 3.4*   < > 3.4* 3.7 3.3* 4.1  CL  --    < > 100   < > 100 106 108 103  CO2  --    < > 27   < > _0 GLUCOSE  --    < > 113*   < > 115* 97 84 122*  BUN  --    < > 11   < > _1 CREATININE  --    < > 0.64   < > 0.67 0.58 0.55 0.61  CALCIUM  --    < > 8.8*   < > 9.1 8.3* 8.3* 8.6*  GFRNONAA  --    < > >60   < > >60 >60 >60 >60  PROT 6.4   < > 6.9  --  7.6 6.0*  --   --   ALBUMIN 4.2   < > 4.0  --  4.3 3.3*  --   --   AST 20   < > 34  --  24 23  --   --   ALT 18   < > 34  --  20 16  --   --   ALKPHOS 64   < > 49  --  55 42  --   --   BILITOT 0.7   < > 1.0  --  1.1 0.9  --   --   BILIDIR 0.17  --   --   --   --   --   --   --    < > = values in this interval not displayed.    RADIOGRAPHIC STUDIES: I have personally reviewed the radiological images as listed and agreed with the findings in the report. CT CHEST WO  CONTRAST  Result Date: 12/28/2020 CLINICAL DATA:  Acute abdominal pain.  Shortness of breath. EXAM: CT CHEST, ABDOMEN, AND PELVIS WITH CONTRAST TECHNIQUE: Multidetector CT imaging of the chest, abdomen and pelvis was performed following the standard protocol during bolus administration of intravenous contrast. CONTRAST:  19m OMNIPAQUE IOHEXOL 300 MG/ML  SOLN COMPARISON:  Chest CT dated 04/27/2020. CT abdomen dated 05/21/2020. FINDINGS: CT CHEST FINDINGS Cardiovascular: No thoracic aortic aneurysm. No pericardial effusion. Scattered coronary artery calcifications. Mild aortic atherosclerosis. 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 Mediastinum/Nodes: No mass or enlarged lymph nodes within the mediastinum or perihilar regions. Esophagus appears normal. Trachea and central bronchi are unremarkable. Lungs/Pleura: Small chronic pleural effusions with associated atelectasis. Lungs otherwise clear. No pneumothorax. Musculoskeletal: Mild degenerative spondylosis of the kyphotic thoracic spine. No acute-appearing osseous abnormality. CT ABDOMEN PELVIS FINDINGS Hepatobiliary: No focal liver abnormality is seen. Gallbladder is distended but otherwise unremarkable. No bile duct dilatation is seen. Pancreas: Unremarkable. No pancreatic ductal dilatation or surrounding inflammatory changes. Spleen: Normal in size without focal abnormality. Adrenals/Urinary Tract: Adrenal glands appear normal. Kidneys  are unremarkable without suspicious mass, stone or hydronephrosis. No obstructing ureteral stone. Bladder is unremarkable, partially decompressed. Stomach/Bowel: No dilated large or small bowel loops. Extensive diverticulosis of the transverse, descending and sigmoid colon but no focal inflammatory change to suggest acute diverticulitis. Mildly distended small bowel loops throughout the abdomen and pelvis, with fluid and associated air-fluid levels throughout. Stomach is  unremarkable, partially decompressed. Vascular/Lymphatic: No acute-appearing vascular abnormality. Mild aortic atherosclerosis. No enlarged lymph nodes are seen in the abdomen or pelvis. Reproductive: Uterus and bilateral adnexa are unremarkable. Other: Fluid stranding and nodular thickening of the anterior mesentery and central mesentery, most suggestive of omental caking suggesting neoplastic process (peritoneal carcinomatosis. Small amount of free fluid adjacent to the liver and spleen. No circumscribed fluid collection or abscess-like collection is identified. Musculoskeletal: Degenerative spondylosis of the lumbar spine, moderate in degree. No acute or suspicious osseous abnormality. (Lucent lesion within the medial aspect of the LEFT iliac bone, of uncertain significance, possibly a small benign bone island or degenerative subchondral cyst. No additional similar-appearing lesions within the axial skeleton or osseous pelvis. IMPRESSION: 1. 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 Differential for peritoneal carcinomatosis is primarily neoplastic and includes cancers of the ovary, appendix, colon, pancreas and stomach. PET-CT may be helpful for further characterization. Tissue sampling of the mesentery may be eventually required for diagnosis. 2. Mildly distended small bowel loops throughout the abdomen and pelvis, with fluid and associated air-fluid levels throughout the nondistended small bowel, suggesting a mild ileus versus partial small bowel obstruction. Favor partial small bowel obstruction with transition zone in the RIGHT lower quadrant, likely related to aforementioned neoplastic peritoneal implants and/or adhesions. 3. Small free fluid within the RIGHT upper quadrant and LEFT upper quadrant. No abscess collection seen. No free intraperitoneal air  seen. 4. Extensive colonic diverticulosis without evidence of acute diverticulitis. 5. Small chronic pleural effusions with associated atelectasis. Aortic Atherosclerosis (ICD10-I70.0). These results were called by telephone at the time of interpretation on 12/28/2020 at 12:27 pm to provider [facility] , who verbally acknowledged these results. Electronically Signed   By: Franki Cabot M.D.   On: 12/28/2020 12:28   CT ABDOMEN PELVIS W CONTRAST  Result Date: 12/28/2020 CLINICAL DATA:  Acute abdominal pain.  Shortness of breath. EXAM: CT CHEST, ABDOMEN, AND PELVIS WITH CONTRAST TECHNIQUE: Multidetector CT imaging of the chest, abdomen and pelvis was performed following the standard protocol during bolus administration of intravenous contrast. CONTRAST:  84m OMNIPAQUE IOHEXOL 300 MG/ML  SOLN COMPARISON:  Chest CT dated 04/27/2020. CT abdomen dated 05/21/2020. FINDINGS: CT CHEST FINDINGS Cardiovascular: No thoracic aortic aneurysm. No pericardial effusion. Scattered coronary artery calcifications. Mild aortic atherosclerosis. 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 Mediastinum/Nodes: No mass or enlarged lymph nodes within the mediastinum or perihilar regions. Esophagus appears normal. Trachea and central bronchi are unremarkable. Lungs/Pleura: Small chronic pleural effusions with associated atelectasis. Lungs otherwise clear. No pneumothorax. Musculoskeletal: Mild degenerative spondylosis of the kyphotic thoracic spine. No acute-appearing osseous abnormality. CT ABDOMEN PELVIS FINDINGS Hepatobiliary: No focal liver abnormality is seen. Gallbladder is distended but otherwise unremarkable. No bile duct dilatation is seen. Pancreas: Unremarkable. No pancreatic ductal dilatation or surrounding inflammatory changes. Spleen: Normal in size without focal abnormality. Adrenals/Urinary Tract: Adrenal glands appear normal. Kidneys are unremarkable without suspicious  mass, stone or hydronephrosis. No obstructing ureteral stone. Bladder is unremarkable, partially decompressed. Stomach/Bowel:  No dilated large or small bowel loops. Extensive diverticulosis of the transverse, descending and sigmoid colon but no focal inflammatory change to suggest acute diverticulitis. Mildly distended small bowel loops throughout the abdomen and pelvis, with fluid and associated air-fluid levels throughout. Stomach is unremarkable, partially decompressed. Vascular/Lymphatic: No acute-appearing vascular abnormality. Mild aortic atherosclerosis. No enlarged lymph nodes are seen in the abdomen or pelvis. Reproductive: Uterus and bilateral adnexa are unremarkable. Other: Fluid stranding and nodular thickening of the anterior mesentery and central mesentery, most suggestive of omental caking suggesting neoplastic process (peritoneal carcinomatosis. Small amount of free fluid adjacent to the liver and spleen. No circumscribed fluid collection or abscess-like collection is identified. Musculoskeletal: Degenerative spondylosis of the lumbar spine, moderate in degree. No acute or suspicious osseous abnormality. (Lucent lesion within the medial aspect of the LEFT iliac bone, of uncertain significance, possibly a small benign bone island or degenerative subchondral cyst. No additional similar-appearing lesions within the axial skeleton or osseous pelvis. IMPRESSION: 1. 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 Differential for peritoneal carcinomatosis is primarily neoplastic and includes cancers of the ovary, appendix, colon, pancreas and stomach. PET-CT may be helpful for further characterization. Tissue sampling of the mesentery may be eventually required for diagnosis. 2. Mildly distended small bowel loops throughout the abdomen and pelvis, with fluid  and associated air-fluid levels throughout the nondistended small bowel, suggesting a mild ileus versus partial small bowel obstruction. Favor partial small bowel obstruction with transition zone in the RIGHT lower quadrant, likely related to aforementioned neoplastic peritoneal implants and/or adhesions. 3. Small free fluid within the RIGHT upper quadrant and LEFT upper quadrant. No abscess collection seen. No free intraperitoneal air seen. 4. Extensive colonic diverticulosis without evidence of acute diverticulitis. 5. Small chronic pleural effusions with associated atelectasis. Aortic Atherosclerosis (ICD10-I70.0). These results were called by telephone at the time of interpretation on 12/28/2020 at 12:27 pm to provider [facility] , who verbally acknowledged these results. Electronically Signed   By: Franki Cabot M.D.   On: 12/28/2020 12:28   DG Chest Portable 1 View  Result Date: 12/28/2020 CLINICAL DATA:  Severe abdominal pain. Clinical concern for free peritoneal air. EXAM: PORTABLE CHEST 1 VIEW COMPARISON:  05/21/2020 FINDINGS: Normal sized heart. Clear lungs. No free peritoneal air seen. Thoracic spine degenerative changes. IMPRESSION: No free peritoneal air or other acute abnormality. Electronically Signed   By: Claudie Revering M.D.   On: 12/28/2020 11:58

## 2021-01-01 NOTE — Progress Notes (Signed)
START ON PATHWAY REGIMEN - Ovarian     A cycle is every 21 days:     Paclitaxel      Carboplatin   **Always confirm dose/schedule in your pharmacy ordering system**  Patient Characteristics: Preoperative or Nonsurgical Candidate (Clinical Staging), Newly Diagnosed, Neoadjuvant Therapy followed by Surgery BRCA Mutation Status: Awaiting Test Results Therapeutic Status: Preoperative or Nonsurgical Candidate (Clinical Staging) AJCC T Category: cT3 AJCC 8 Stage Grouping: Unknown AJCC N Category: cN0 AJCC M Category: cM0 Therapy Plan: Neoadjuvant Therapy followed by Surgery Intent of Therapy: Curative Intent, Discussed with Patient 

## 2021-01-01 NOTE — Plan of Care (Signed)
  Problem: Nutrition: Goal: Adequate nutrition will be maintained Outcome: Progressing   Problem: Activity: Goal: Risk for activity intolerance will decrease Outcome: Progressing   Problem: Safety: Goal: Ability to remain free from injury will improve Outcome: Progressing   Problem: Pain Managment: Goal: General experience of comfort will improve Outcome: Progressing

## 2021-01-01 NOTE — Discharge Instructions (Signed)
[facility] ______[facility] LAPAROSCOPIC SURGERY: POST OP INSTRUCTIONS Always review your discharge instruction sheet given to you by the facility where your surgery was performed. IF YOU HAVE DISABILITY OR FAMILY LEAVE FORMS, YOU MUST BRING THEM TO THE OFFICE FOR PROCESSING.   DO NOT GIVE THEM TO YOUR DOCTOR.  A prescription for pain medication may be given to you upon discharge.  Take your pain medication as prescribed, if needed.  If narcotic pain medicine is not needed, then you may take acetaminophen (Tylenol) or ibuprofen (Advil) as needed. Take your usually prescribed medications unless otherwise directed. If you need a refill on your pain medication, please contact your pharmacy.  They will contact our office to request authorization. Prescriptions will not be filled after 5pm or on week-ends. You should follow a light diet the first few days after arrival home, such as soup and crackers, etc.  Be sure to include lots of fluids daily. Most patients will experience some swelling and bruising in the area of the incisions.  Ice packs will help.  Swelling and bruising can take several days to resolve.  It is common to experience some constipation if taking pain medication after surgery.  Increasing fluid intake and taking a stool softener (such as Colace) will usually help or prevent this problem from occurring.  A mild laxative (Milk of Magnesia or Miralax) should be taken according to package instructions if there are no bowel movements after 48 hours. Unless discharge instructions indicate otherwise, you may remove your bandages 24-48 hours after surgery, and you may shower at that time.  You may have steri-strips (small skin tapes) in place directly over the incision.  These strips should be left on the skin for 7-10 days.  If your surgeon used skin glue on the incision, you may shower in 24 hours.  The glue will flake off over the next 2-3 weeks.  Any sutures or staples will be  removed at the office during your follow-up visit. ACTIVITIES:  You may resume regular (light) daily activities beginning the next day--such as daily self-care, walking, climbing stairs--gradually increasing activities as tolerated.  You may have sexual intercourse when it is comfortable.  Refrain from any heavy lifting or straining until approved by your doctor. You may drive when you are no longer taking prescription pain medication, you can comfortably wear a seatbelt, and you can safely maneuver your car and apply brakes. RETURN TO WORK:  __________________________________________________________ You should see your doctor in the office for a follow-up appointment approximately 2-3 weeks after your surgery.  Make sure that you call for this appointment within a day or two after you arrive home to insure a convenient appointment time. OTHER INSTRUCTIONS: __________________________________________________________________________________________________________________________ __________________________________________________________________________________________________________________________ WHEN TO CALL YOUR DOCTOR: Fever over 101.0 Inability to urinate Continued bleeding from incision. Increased pain, redness, or drainage from the incision. Increasing abdominal pain  The clinic staff is available to answer your questions during regular business hours.  Please don't hesitate to call and ask to speak to one of the nurses for clinical concerns.  If you have a medical emergency, go to the nearest emergency room or call 911.  A surgeon from [facility] is always on call at the hospital. [address], [address], [place], [place]  [address] ? [address] [address], [place], [place]   [address] [phone] ? [phone] ? FAX [phone] Web site: [email or web address]  

## 2021-01-01 NOTE — Progress Notes (Signed)
Patients alert and oriented. Discharge package printed and instructions given to pt. Verbalizes understanding. No question ask. Called CT and await on Xray copy.

## 2021-01-01 NOTE — Progress Notes (Signed)
2 Days Post-Op    DX:AJOINOMVE cramps  Subjective: She is doing well and anxious to go home.  She has not  had a BM but is ready to go.   Objective: Vital signs in last 24 hours: Temp:  [98.1 F (36.7 C)-98.3 F (36.8 C)] 98.3 F (36.8 C) (07/14 0615) Pulse Rate:  [57-68] 57 (07/14 0615) Resp:  [16] 16 (07/14 0615) BP: (122-167)/(51-76) 167/76 (07/14 0615) SpO2:  [94 %-97 %] 97 % (07/14 0615) Last BM Date: 12/28/20 880 p.o. 1622 IV Voided x4 No BM recorded Afebrile vital signs are stable Intake/Output from previous day: 07/13 0701 - 07/14 0700 In: 2502.9 [P.O.:880; I.V.:1622.9] Out: -  Intake/Output this shift: No intake/output data recorded.  General appearance: alert, cooperative, and no distress Resp: clear GI: port sites OK , pain controlled with Tylenol  Lab Results:  Recent Labs    12/30/20 0318 12/31/20 0317  WBC 4.2 8.1  HGB 12.4 12.8  HCT 39.6 39.9  PLT 239 255    BMET Recent Labs    12/30/20 0318 12/31/20 0317  NA 142 137  K 3.3* 4.1  CL 108 103  CO2 25 24  GLUCOSE 84 122*  BUN 9 10  CREATININE 0.55 0.61  CALCIUM 8.3* 8.6*   PT/INR No results for input(s): LABPROT, INR in the last 72 hours.  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 Kieth Brightly 01/14/21  Plan:  for discharge today.  Follow up listed on AVS.  She is awaiting path and wants to go to [facility] for this.  She is working on obtaining follow up.      LOS: 4 days    Jidenna Figgs 01/01/2021 Please see Amion

## 2021-01-01 NOTE — Telephone Encounter (Signed)
OK I sent LOS for scheduler to schedule chemo on 7/25

## 2021-01-02 ENCOUNTER — Telehealth: Payer: Self-pay | Admitting: Oncology

## 2021-01-02 ENCOUNTER — Ambulatory Visit: Payer: Self-pay | Admitting: General Surgery

## 2021-01-02 NOTE — Telephone Encounter (Signed)
Called Brittany Middleton and went over her appointments scheduled on 01/09/21 with Dr. Berline Lopes and Dr. [name].  Also discussed that Dr. Amie Portland office should be calling her to schedule a port placement on 01/06/21 and that chemo should start on 01/12/21.  She verbalized agreement and was encouraged to call with any questions or needs.

## 2021-01-05 NOTE — Progress Notes (Signed)
Pharmacist Chemotherapy Monitoring - Initial Assessment    Anticipated start date: 01/12/21   The following has been reviewed per standard work regarding the patient's treatment regimen: The patient's diagnosis, treatment plan and drug doses, and organ/hematologic function Lab orders and baseline tests specific to treatment regimen  The treatment plan start date, drug sequencing, and pre-medications Prior authorization status  Patient's documented medication list, including drug-drug interaction screen and prescriptions for anti-emetics and supportive care specific to the treatment regimen The drug concentrations, fluid compatibility, administration routes, and timing of the medications to be used The patient's access for treatment and lifetime cumulative dose history, if applicable  The patient's medication allergies and previous infusion related reactions, if applicable   Changes made to treatment plan:  N/A  Follow up needed:  Pending authorization for treatment    Larene Beach, [place], 01/05/2021  4:31 PM 2

## 2021-01-06 ENCOUNTER — Telehealth: Payer: Self-pay | Admitting: Oncology

## 2021-01-06 ENCOUNTER — Encounter: Payer: Self-pay | Admitting: Hematology and Oncology

## 2021-01-06 NOTE — Telephone Encounter (Signed)
Called Brittany Middleton and notified her of chemotherapy apt on 01/12/21.  She mentioned that she is scheduled to have her port placed on 01/09/21 which will overlap with her education apt at 2:00.  Rescheduled education to 8:00 on 01/12/21.  Brittany Middleton verbalized understanding of new appointment times.

## 2021-01-06 NOTE — Progress Notes (Addendum)
COVID Vaccine Completed: Yes x4 Date COVID Vaccine completed: 07-08-19 & 07-26-19 Has received booster: 02-07-20 x2 COVID vaccine manufacturer: [employer]       Date of COVID positive in last 90 days:  N/A  PCP - Doreatha Martin, MD Cardiologist - Jyl Heinz, MD  Chest x-ray - 12-28-20 Epic EKG - 05-21-20 Epic Stress Test - N/A ECHO - 04-27-20 Epic Cardiac Cath - N/A Pacemaker/ICD device last checked: Spinal Cord Stimulator: Holter monitor - 08-19-15 Epic Coronary CT - 06-25-19 Epic  Sleep Study -  Yes, +sleep apnea CPAP - Currently not using due to discomfort  Fasting Blood Sugar - N/A Checks Blood Sugar _____ times a day  Blood Thinner Instructions:  Plavix 75 mg.  Last dose 01-07-21.  Patient states that she was not given instructions but will call and check on that today. Aspirin Instructions: Last Dose:  Activity level:  Can go up a flight of stairs and perform activities of daily living without stopping and without symptoms of chest pain or shortness of breath.      Anesthesia review:  Coronary aneurysm, HTN  Patient denies shortness of breath, fever, cough and chest pain at PAT appointment   Patient verbalized understanding of instructions that were given to them at the PAT appointment. Patient was also instructed that they will need to review over the PAT instructions again at home before surgery.

## 2021-01-06 NOTE — Patient Instructions (Addendum)
DUE TO COVID-19 ONLY ONE VISITOR IS ALLOWED TO COME WITH YOU AND STAY IN THE WAITING ROOM ONLY DURING PRE OP AND PROCEDURE.   **NO VISITORS ARE ALLOWED IN THE SHORT STAY AREA OR RECOVERY ROOM!!**         Your procedure is scheduled on:  Friday, 01-09-21   Report to [facility] Main  Entrance    Report to admitting at 2:00 PM   Call this number if you have problems the morning of surgery [phone]   Do not eat food :After Midnight.   May have liquids until 1:00 PM  day of surgery  CLEAR LIQUID DIET  Foods Allowed                                                                     Foods Excluded  Water, Black Coffee and tea, regular and decaf               liquids that you cannot  Plain Jell-O in any flavor  (No red)                                     see through such as: Fruit ices (not with fruit pulp)                                      milk, soups, orange juice              Iced Popsicles (No red)                                      All solid food                                   Apple juices Sports drinks like Gatorade (No red) Lightly seasoned clear broth or consume(fat free) Sugar, honey syrup     Oral Hygiene is also important to reduce your risk of infection.                                    Remember - BRUSH YOUR TEETH THE MORNING OF SURGERY WITH YOUR REGULAR TOOTHPASTE   Do NOT smoke after Midnight   Take these medicines the morning of surgery with A SIP OF WATER:  Omeprazole, Pantoprazole, Rosuvastatin                               You may not have any metal on your body including hair pins, jewelry, and body piercing             Do not wear make-up, lotions, powders, perfumes/cologne, or deodorant  Do not wear nail polish including gel and S&S, artificial/acrylic nails, or any other type of covering on natural nails including finger and toenails. If you have artificial  nails, gel coating, etc. that needs to be removed by a nail salon please have  this removed prior to surgery or surgery may need to be canceled/ delayed if the surgeon/ anesthesia feels like they are unable to be safely monitored.   Do not shave  48 hours prior to surgery.            Do not bring valuables to the hospital. [place].   Contacts, dentures or bridgework may not be worn into surgery.    Patients discharged the day of surgery will not be allowed to drive home.  Special Instructions: Bring a copy of your healthcare power of attorney and living will documents         the day of surgery if you haven't scanned them in before.   Please read over the following fact sheets you were given: IF YOU HAVE QUESTIONS ABOUT YOUR PRE OP INSTRUCTIONS PLEASE CALL [facility] - Preparing for Surgery Before surgery, you can play an important role.  Because skin is not sterile, your skin needs to be as free of germs as possible.  You can reduce the number of germs on your skin by washing with CHG (chlorahexidine gluconate) soap before surgery.  CHG is an antiseptic cleaner which kills germs and bonds with the skin to continue killing germs even after washing. Please DO NOT use if you have an allergy to CHG or antibacterial soaps.  If your skin becomes reddened/irritated stop using the CHG and inform your nurse when you arrive at Short Stay. Do not shave (including legs and underarms) for at least 48 hours prior to the first CHG shower.  You may shave your face/neck.  Please follow these instructions carefully:  1.  Shower with CHG Soap the night before surgery and the  morning of surgery.  2.  If you choose to wash your hair, wash your hair first as usual with your normal  shampoo.  3.  After you shampoo, rinse your hair and body thoroughly to remove the shampoo.                             4.  Use CHG as you would any other liquid soap.  You can apply chg directly to the skin and wash.  Gently with a scrungie or clean  washcloth.  5.  Apply the CHG Soap to your body ONLY FROM THE NECK DOWN.   Do   not use on face/ open                           Wound or open sores. Avoid contact with eyes, ears mouth and   genitals (private parts).                       Wash face,  Genitals (private parts) with your normal soap.             6.  Wash thoroughly, paying special attention to the area where your    surgery  will be performed.  7.  Thoroughly rinse your body with warm water from the neck down.  8.  DO NOT shower/wash with your normal soap after using and rinsing off the CHG Soap.                9.  Pat yourself dry with a clean towel.            10.  Wear clean pajamas.            11.  Place clean sheets on your bed the night of your first shower and do not  sleep with pets. Day of Surgery : Do not apply any lotions/deodorants the morning of surgery.  Please wear clean clothes to the hospital/surgery center.  FAILURE TO FOLLOW THESE INSTRUCTIONS MAY RESULT IN THE CANCELLATION OF YOUR SURGERY  PATIENT SIGNATURE_________________________________  NURSE SIGNATURE__________________________________  ________________________________________________________________________

## 2021-01-07 ENCOUNTER — Other Ambulatory Visit: Payer: Self-pay

## 2021-01-07 ENCOUNTER — Encounter (HOSPITAL_COMMUNITY)
Admission: RE | Admit: 2021-01-07 | Discharge: 2021-01-07 | Disposition: A | Discharge status: Home / Self Care | Payer: Medicare Other | Source: Ambulatory Visit | Attending: General Surgery | Admitting: General Surgery

## 2021-01-07 ENCOUNTER — Encounter (HOSPITAL_COMMUNITY): Payer: Self-pay

## 2021-01-07 DIAGNOSIS — Z01812 Encounter for preprocedural laboratory examination: Secondary | ICD-10-CM | POA: Diagnosis present

## 2021-01-07 HISTORY — DX: Unspecified osteoarthritis, unspecified site: M19.90

## 2021-01-07 HISTORY — DX: Malignant neoplasm of peritoneum, unspecified: C48.2

## 2021-01-07 HISTORY — DX: Malignant (primary) neoplasm, unspecified: C80.1

## 2021-01-07 HISTORY — DX: Pneumonia, unspecified organism: J18.9

## 2021-01-08 ENCOUNTER — Telehealth: Payer: Self-pay | Admitting: Oncology

## 2021-01-08 NOTE — Telephone Encounter (Signed)
Brittany Middleton and she has decided to have surgery at [facility] with Dr. Fransisca Connors.  She would like to have her chemotherapy treatments at [facility] in [place].  Advised her that we will cancel Dr. Charisse March apt tomorrow and reviewed her appointment times for lab and with Dr. [name].

## 2021-01-08 NOTE — Progress Notes (Deleted)
GYNECOLOGIC ONCOLOGY NEW PATIENT CONSULTATION   Patient Name: Brittany Middleton  Patient Age: 70 y.o. 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  She presented earlier this week with 2 weeks of abdominal symptoms and GI dysfunction.  While in the emergency department, she underwent CT scan showing evidence of peritoneal carcinomatosis and possible small bowel obstruction.  She was admitted and was taken for diagnostic laparoscopy and biopsy of an abdominal wall lesion on 7/12.  Findings Intra-Op were extensive peritoneal and mesenteric carcinomatosis as well as a left upper quadrant abdominal mass.  Tumor markers were obtained and both CEA and CA 19-9 were negative however CA-125 was mildly elevated at 95.  She has done well since surgery and has been able to advance her diet to a regular diet without nausea or emesis and is reporting bowel function in the form of flatus.  She was scheduled for discharge today.   I recommended a pelvic ultrasound to better characterize her GYN organs, which look unremarkable on CT scan.  Given the extent of disease with her only mildly elevated CA-125, this could signal either a non-ovarian primary (such as metastatic uterine cancer) or low-grade disease.   I spoke with Dr. [name] to help facilitate initiating the patient's cancer treatment.  Given her extent of disease both by imaging as well as diagnostic laparoscopy, abdominal cytoreduction would not be possible at this time.  She is an excellent candidate for neoadjuvant chemotherapy.     I met with the patient and her son at bedside this afternoon.  We discussed her CT findings, preliminary pathology, as well as findings at the time of surgery, which are all  concerning for advanced and metastatic gynecologic cancer.  I briefly discussed the 2 treatment paradigms for advanced gynecologic cancer, specifically relating to ovarian cancer.  We discussed that treatment is frequently with a combination of surgery as well as chemotherapy.  In the setting of significant disease burden that does not appear to be resectable at the time of presentation, I recommend proceeding with neoadjuvant chemotherapy.  Her pelvic ultrasound will help identify whether her uterus may be the source of this pathology.  I would like to see her for an official consultation in my clinic where I can perform an adequate pelvic exam and any biopsies if needed.   We discussed the typical treatment course, that involves 3 cycles of chemotherapy followed by repeat imaging to assess for response.  Depending on her radiologic response, we will make a decision about the feasibility and appropriateness of surgery at that time.   A copy of this note was sent to the patient's referring provider.   *** minutes of total time was spent for this patient encounter, including preparation, face-to-face counseling with the patient and coordination of care, and documentation of the encounter.   Jeral Pinch, MD  Division of Gynecologic Oncology  Department of Obstetrics and Gynecology  [facility]  ___________________________________________  Chief Complaint: No chief complaint on file.   History of Present Illness:  Brittany Middleton is a 70 y.o. y.o. female who is seen in consultation at the request of Dr. 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 - Diverticulosis in the sigmoid colon and in the descending colon. - No specimens collected. - Blood in stool without cause found on endoscopic exam   05/08/2020 Imaging   1. Bladder is decompressed though demonstrates significant perivesicular hazy stranding, mucosal hyperemia and edematous mural thickening. Findings are suggestive of cystitis. Correlate with urinalysis. No abnormal perinephric or periureteral stranding or other features to suggest an ascending tract infection at this time. 2. Colonic diverticulosis without evidence of acute diverticulitis. 3. Aortic Atherosclerosis (ICD10-I70.0).   12/28/2020 Imaging   1. 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 Differential for peritoneal carcinomatosis is primarily neoplastic and includes cancers of the ovary, appendix, colon, pancreas and stomach. PET-CT may be helpful for further characterization. Tissue sampling of the mesentery may be eventually required for diagnosis. 2. Mildly distended small bowel loops throughout the abdomen and pelvis, with fluid and associated air-fluid levels throughout the nondistended small bowel, suggesting a mild ileus  versus partial small bowel obstruction. Favor partial small bowel obstruction with transition zone in the RIGHT lower quadrant, likely related to aforementioned neoplastic peritoneal implants and/or adhesions. 3. Small free fluid within the RIGHT upper quadrant and LEFT upper quadrant. No abscess collection seen. No free intraperitoneal air seen. 4. Extensive colonic diverticulosis without evidence of acute diverticulitis. 5. Small chronic pleural effusions with associated atelectasis.   12/29/2020 Tumor Marker   Patient's tumor was tested for the following markers: CA-125. Results of the tumor marker test revealed 95.1.   12/30/2020 Surgery   Preoperative diagnosis: abdominal nodules   Postoperative diagnosis: same   Procedure: diagnostic laparoscopy with abdominal wall biopsy   Surgeon: Gurney Maxin, M.D.    Indications for procedure: Justine Dines is a 70 y.o. year old female with symptoms of nausea, vomiting, and abdominal pain. Work up was concerning for cancer with abdominal wall studding.   Description of procedure: The patient was brought into the operative suite. Anesthesia was administered with General endotracheal anesthesia. WHO checklist was applied. The patient was then placed in supine position. The area was prepped and draped in the usual sterile fashion.   A small left subcostal incision was made. A 47m trocar was used to gain access to the peritoneal cavity by optical entry technique. Pneumoperitoneum was applied with a high flow and low pressure. The laparoscope was reinserted to confirm position.   On initial visualization of the abdomen, there were multiple nodules throughout the abdominal wall. There were multiple nodules over the small intestine and mesentery. There was some loops of small intestine that were matted together. There was a large white mass  in the left upper quadrant. Multiple areas of peritoneum were removed with harmonic scalpel. The large mass was  inspected. It appeared to be related to the colon and omentum. Grasper was used to remove some superficial tissue and was sent for culture.   The abdomen was desufflated. The incisions were closed with 4-0 monocryl subcuticular stitch.   12/30/2020 Pathology Results   FINAL MICROSCOPIC DIAGNOSIS:   A. ABDOMINAL WALL, NODULE, EXCISION:  -  Metastatic adenocarcinoma  -  See comment   B. PERI COLONIC EXUDATE, EXCISION:  -  Metastatic adenocarcinoma  -  See comment   COMMENT:   By immunohistochemistry, the neoplastic cells are positive for cytokeratin 7, PAX8 and WT1 but negative for cytokeratin 20, CDX2, p53 and TTF-1.  The morphology and immunophenotype are consistent with a gynecologic primary.     01/01/2021 Initial Diagnosis   Peritoneal carcinomatosis ([place])    01/12/2021 -  Chemotherapy    Patient is on Treatment Plan: OVARIAN CARBOPLATIN (AUC 6) / PACLITAXEL (175) Q21D X 6 CYCLES        PAST MEDICAL HISTORY:  Past Medical History:  Diagnosis Date   Acute blood loss anemia 07/17/2018   Anemia 07/17/2018   Arthritis    Ascending aortic aneurysm (HCC) 05/23/2019   Asthma    Cancer ([place])    Cervical disc disease    MRI 2016   Chest pain with moderate risk for cardiac etiology 02/27/2018   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   Gastrointestinal bleeding 07/25/2018   Gastrointestinal hemorrhage with melena 07/16/2018   GERD (gastroesophageal reflux disease)    Hepatic steatosis    per imaging 2016, 2019   Hiatal hernia    History of palpitations    Holter monitor, 2017: NSR, occasional PVC, PACs, no arrhythmia   HLD (hyperlipidemia) 02/27/2018   HTN (hypertension) 02/27/2018   Hypercholesteremia    Hypercholesterolemia 04/07/2017   Hypertension    Hypertensive disorder 04/07/2017   Hypokalemia 07/17/2018   Lumbar disc disease    MRI, 2017    Morbid obesity ([place]) 05/22/2018   Overweight 03/03/2018   Palpitations 08/19/2015   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[place])    Pneumonia     Pyelonephritis 03/30/2020   Sepsis secondary to UTI ([place]) 04/25/2020   Severe sepsis with acute organ dysfunction ([place]) 04/25/2020   Sleep apnea    UTI (urinary tract infection)      PAST SURGICAL HISTORY:  Past Surgical History:  Procedure Laterality Date   BLADDER REPAIR     BREAST EXCISIONAL BIOPSY Left    CARPAL TUNNEL RELEASE Right    CATARACT EXTRACTION, BILATERAL  2014   COLONOSCOPY WITH PROPOFOL N/A 07/18/2018   Procedure: COLONOSCOPY WITH PROPOFOL;  Surgeon: Laurence Spates, MD;  Location: [place];  Service: Endoscopy;  Laterality: N/A;   ESOPHAGOGASTRODUODENOSCOPY (EGD) WITH PROPOFOL N/A 07/17/2018   Procedure: ESOPHAGOGASTRODUODENOSCOPY (EGD) WITH PROPOFOL;  Surgeon: Laurence Spates, MD;  Location: [place];  Service: Endoscopy;  Laterality: N/A;   LAPAROSCOPY N/A 12/30/2020   Procedure: LAPAROSCOPY DIAGNOSTIC; WITH  BIOPSY OF ABDOMINAL WALL NODULES AND BIOPSY OF PERICOLONIC EXUDATE;  Surgeon: Kinsinger, Arta Bruce, MD;  Location: [facility];  Service: General;  Laterality: N/A;   REPAIR RECTOCELE     TOE SURGERY Left     OB/GYN HISTORY:  OB History  No obstetric history on file.    No LMP recorded. Patient is postmenopausal.  Age at menarche: ***  Age at menopause: *** Hx of HRT: *** Hx of STDs: *** Last pap: *** History  of abnormal pap smears: ***  SCREENING STUDIES:  Last mammogram: ***  Last colonoscopy: *** Last bone mineral density: ***  MEDICATIONS: Outpatient Encounter Medications as of 01/09/2021  Medication Sig   acetaminophen (TYLENOL) 500 MG tablet Take 1,000 mg by mouth every 6 (six) hours as needed for mild pain, moderate pain, fever or headache.   albuterol (PROVENTIL) (2.5 MG/3ML) 0.083% nebulizer solution Take 2.5 mg by nebulization every 6 (six) hours as needed for wheezing or shortness of breath.   albuterol (VENTOLIN HFA) 108 (90 Base) MCG/ACT inhaler Inhale 2 puffs into the lungs every 6 (six) hours as needed for wheezing or shortness  of breath.   alclomethasone (ACLOVATE) 0.05 % ointment Apply 1 application topically 2 (two) times daily.   BIOTIN PO Take 1 tablet by mouth daily.   Cholecalciferol (VITAMIN D-3) 25 MCG (1000 UT) CAPS Take 1,000 Units by mouth daily.   clopidogrel (PLAVIX) 75 MG tablet Take 1 tablet (75 mg total) by mouth daily.   co-enzyme Q-10 30 MG capsule Take 30 mg by mouth daily.   dicyclomine (BENTYL) 10 MG capsule Take 1 capsule (10 mg total) by mouth every 6 (six) hours as needed for up to 14 days for spasms (abdominal cramps).   estradiol (ESTRACE) 0.1 MG/GM vaginal cream Place 1 Applicatorful vaginally 3 (three) times a week.   fluticasone (FLONASE) 50 MCG/ACT nasal spray Place 1 spray into both nostrils daily. (Patient taking differently: Place 1 spray into both nostrils daily as needed for allergies.)   fluticasone (FLOVENT HFA) 110 MCG/ACT inhaler Inhale 2 puffs into the lungs 2 (two) times daily. (Patient taking differently: Inhale 2 puffs into the lungs 2 (two) times daily as needed (shortness of breath or wheezing).)   fosfomycin (MONUROL) 3 g PACK Take 3 g by mouth every 3 (three) days.   hydrocortisone 2.5 % cream Apply 1 application topically daily as needed (forehead).    losartan (COZAAR) 50 MG tablet Take 1 tablet (50 mg total) by mouth daily. (Patient taking differently: Take 100 mg by mouth daily.)   Melatonin 5 MG CHEW Chew 5 mg by mouth at bedtime.    Multiple Vitamins-Minerals (MULTIVITAMIN WITH MINERALS) tablet Take 1 tablet by mouth daily.   omeprazole (PRILOSEC) 40 MG capsule Take 40 mg by mouth daily.   ondansetron (ZOFRAN ODT) 4 MG disintegrating tablet Take 1 tablet (4 mg total) by mouth every 8 (eight) hours as needed for nausea or vomiting.   pantoprazole (PROTONIX) 40 MG tablet Take 1 tablet (40 mg total) by mouth daily. (Patient taking differently: Take 40 mg by mouth daily as needed (acid reflux).)   polyethylene glycol (MIRALAX / GLYCOLAX) 17 g packet Take 8.5 g by mouth 2  (two) times daily.   PRESCRIPTION MEDICATION Inhale into the lungs at bedtime. CPAP   rosuvastatin (CRESTOR) 20 MG tablet TAKE ONE TABLET BY MOUTH ONE TIME DAILY (Patient taking differently: Take 20 mg by mouth daily.)   No facility-administered encounter medications on file as of 01/09/2021.    ALLERGIES:  Allergies  Allergen Reactions   Macrobid [Nitrofurantoin] Shortness Of Breath and Cough    Short of breath, cough, myalgia   Ciprofloxacin Other (See Comments)    Caused PAIN IN HANDS    Codeine Nausea And Vomiting   Azithromycin Palpitations     FAMILY HISTORY:  Family History  Problem Relation Age of Onset   CAD Mother    Breast cancer Mother 31     SOCIAL HISTORY:  Social  Connections: Not on file    REVIEW OF SYSTEMS:  Denies appetite changes, fevers, chills, fatigue, unexplained weight changes. Denies hearing loss, neck lumps or masses, mouth sores, ringing in ears or voice changes. Denies cough or wheezing.  Denies shortness of breath. Denies chest pain or palpitations. Denies leg swelling. Denies abdominal distention, pain, blood in stools, constipation, diarrhea, nausea, vomiting, or early satiety. Denies pain with intercourse, dysuria, frequency, hematuria or incontinence. Denies hot flashes, pelvic pain, vaginal bleeding or vaginal discharge.   Denies joint pain, back pain or muscle pain/cramps. Denies itching, rash, or wounds. Denies dizziness, headaches, numbness or seizures. Denies swollen lymph nodes or glands, denies easy bruising or bleeding. Denies anxiety, depression, confusion, or decreased concentration.  Physical Exam:  Vital Signs for this encounter:  There were no vitals taken for this visit. There is no height or weight on file to calculate BMI. General: Alert, oriented, no acute distress.  HEENT: Normocephalic, atraumatic. Sclera anicteric.  Chest: Clear to auscultation bilaterally. No wheezes, rhonchi, or rales. Cardiovascular: Regular rate  and rhythm, no murmurs, rubs, or gallops.  Abdomen: ***Obese. Normoactive bowel sounds. Soft, nondistended, nontender to palpation. No masses or hepatosplenomegaly appreciated. No palpable fluid wave.  Extremities: Grossly normal range of motion. Warm, well perfused. No edema bilaterally.  Skin: No rashes or lesions.  Lymphatics: No cervical, supraclavicular, or inguinal adenopathy.  GU:  Normal external female genitalia. ***  No lesions. No discharge or bleeding.             Bladder/urethra:  No lesions or masses, well supported bladder             Vagina: ***             Cervix: Normal appearing, no lesions.             Uterus: *** Small, mobile, no parametrial involvement or nodularity.             Adnexa: *** masses.  Rectal: ***  LABORATORY AND RADIOLOGIC DATA:  ***Outside medical records were reviewed to synthesize the above history, along with the history and physical obtained during the visit.   Lab Results  Component Value Date   WBC 8.1 12/31/2020   HGB 12.8 12/31/2020   HCT 39.9 12/31/2020   PLT 255 12/31/2020   GLUCOSE 122 (H) 12/31/2020   CHOL 177 02/04/2020   TRIG 166 (H) 02/04/2020   HDL 48 02/04/2020   LDLCALC 100 (H) 02/04/2020   ALT 16 12/29/2020   AST 23 12/29/2020   NA 137 12/31/2020   K 4.1 12/31/2020   CL 103 12/31/2020   CREATININE 0.61 12/31/2020   BUN 10 12/31/2020   CO2 24 12/31/2020   TSH 1.580 12/20/2019   INR 1.2 05/21/2020   Pelvic ultrasound on 7/14: IMPRESSION: Negative pelvic ultrasound with normal sonographic appearance of the uterus, endometrium, and ovaries. No adnexal or ovarian mass identified to explain carcinomatosis.

## 2021-01-09 ENCOUNTER — Ambulatory Visit (HOSPITAL_COMMUNITY)
Admission: RE | Admit: 2021-01-09 | Discharge: 2021-01-09 | Disposition: A | Discharge status: Home / Self Care | Payer: Medicare Other | Attending: General Surgery | Admitting: General Surgery

## 2021-01-09 ENCOUNTER — Telehealth: Payer: Self-pay | Admitting: Oncology

## 2021-01-09 ENCOUNTER — Inpatient Hospital Stay: Payer: Medicare Other | Attending: Hematology and Oncology | Admitting: Hematology and Oncology

## 2021-01-09 ENCOUNTER — Inpatient Hospital Stay: Payer: Medicare Other

## 2021-01-09 ENCOUNTER — Ambulatory Visit (HOSPITAL_COMMUNITY): Payer: Medicare Other | Admitting: Anesthesiology

## 2021-01-09 ENCOUNTER — Ambulatory Visit: Payer: Medicare Other | Admitting: Hematology and Oncology

## 2021-01-09 ENCOUNTER — Other Ambulatory Visit: Payer: Self-pay

## 2021-01-09 ENCOUNTER — Ambulatory Visit (HOSPITAL_COMMUNITY): Payer: Medicare Other

## 2021-01-09 ENCOUNTER — Ambulatory Visit (HOSPITAL_COMMUNITY): Payer: Medicare Other | Admitting: Physician Assistant

## 2021-01-09 ENCOUNTER — Other Ambulatory Visit: Payer: Medicare Other

## 2021-01-09 ENCOUNTER — Ambulatory Visit: Payer: Medicare Other | Admitting: Gynecologic Oncology

## 2021-01-09 ENCOUNTER — Encounter (HOSPITAL_COMMUNITY)
Admission: RE | Disposition: A | Discharge status: Home / Self Care | Payer: Self-pay | Source: Home / Self Care | Attending: General Surgery

## 2021-01-09 ENCOUNTER — Encounter: Payer: Self-pay | Admitting: Oncology

## 2021-01-09 ENCOUNTER — Encounter (HOSPITAL_COMMUNITY): Payer: Self-pay | Admitting: General Surgery

## 2021-01-09 VITALS — BP 142/66 | HR 66 | Temp 98.3°F | Resp 18 | Ht 65.0 in | Wt 252.0 lb

## 2021-01-09 DIAGNOSIS — Z8249 Family history of ischemic heart disease and other diseases of the circulatory system: Secondary | ICD-10-CM | POA: Insufficient documentation

## 2021-01-09 DIAGNOSIS — Z803 Family history of malignant neoplasm of breast: Secondary | ICD-10-CM | POA: Diagnosis not present

## 2021-01-09 DIAGNOSIS — Z885 Allergy status to narcotic agent status: Secondary | ICD-10-CM | POA: Insufficient documentation

## 2021-01-09 DIAGNOSIS — B9629 Other Escherichia coli [E. coli] as the cause of diseases classified elsewhere: Secondary | ICD-10-CM

## 2021-01-09 DIAGNOSIS — C786 Secondary malignant neoplasm of retroperitoneum and peritoneum: Secondary | ICD-10-CM

## 2021-01-09 DIAGNOSIS — H532 Diplopia: Secondary | ICD-10-CM

## 2021-01-09 DIAGNOSIS — C482 Malignant neoplasm of peritoneum, unspecified: Secondary | ICD-10-CM | POA: Insufficient documentation

## 2021-01-09 DIAGNOSIS — Z7952 Long term (current) use of systemic steroids: Secondary | ICD-10-CM | POA: Diagnosis not present

## 2021-01-09 DIAGNOSIS — Z5111 Encounter for antineoplastic chemotherapy: Secondary | ICD-10-CM | POA: Diagnosis not present

## 2021-01-09 DIAGNOSIS — C762 Malignant neoplasm of abdomen: Secondary | ICD-10-CM

## 2021-01-09 DIAGNOSIS — Z6841 Body Mass Index (BMI) 40.0 and over, adult: Secondary | ICD-10-CM | POA: Insufficient documentation

## 2021-01-09 DIAGNOSIS — R5381 Other malaise: Secondary | ICD-10-CM

## 2021-01-09 DIAGNOSIS — Z79899 Other long term (current) drug therapy: Secondary | ICD-10-CM | POA: Diagnosis not present

## 2021-01-09 DIAGNOSIS — N390 Urinary tract infection, site not specified: Secondary | ICD-10-CM | POA: Diagnosis not present

## 2021-01-09 DIAGNOSIS — Z7951 Long term (current) use of inhaled steroids: Secondary | ICD-10-CM | POA: Diagnosis not present

## 2021-01-09 DIAGNOSIS — Z881 Allergy status to other antibiotic agents status: Secondary | ICD-10-CM | POA: Insufficient documentation

## 2021-01-09 DIAGNOSIS — I700 Atherosclerosis of aorta: Secondary | ICD-10-CM | POA: Diagnosis not present

## 2021-01-09 DIAGNOSIS — Z1612 Extended spectrum beta lactamase (ESBL) resistance: Secondary | ICD-10-CM

## 2021-01-09 HISTORY — PX: PORTACATH PLACEMENT: SHX2246

## 2021-01-09 HISTORY — DX: Diplopia: H53.2

## 2021-01-09 LAB — CMP (CANCER CENTER ONLY)
ALT: 15 U/L (ref 0–44)
AST: 18 U/L (ref 15–41)
Albumin: 3.7 g/dL (ref 3.5–5.0)
Alkaline Phosphatase: 59 U/L (ref 38–126)
Anion gap: 10 (ref 5–15)
BUN: 9 mg/dL (ref 8–23)
CO2: 28 mmol/L (ref 22–32)
Calcium: 9.2 mg/dL (ref 8.9–10.3)
Chloride: 103 mmol/L (ref 98–111)
Creatinine: 0.67 mg/dL (ref 0.44–1.00)
GFR, Estimated: >60 mL/min (ref >60)
Glucose, Bld: 99 mg/dL (ref 70–99)
Potassium: 4.1 mmol/L (ref 3.5–5.1)
Sodium: 141 mmol/L (ref 135–145)
Total Bilirubin: 0.7 mg/dL (ref 0.3–1.2)
Total Protein: 7.1 g/dL (ref 6.5–8.1)

## 2021-01-09 LAB — CBC WITH DIFFERENTIAL (CANCER CENTER ONLY)
Abs Immature Granulocytes: 0.01 10*3/uL (ref 0.00–0.07)
Basophils Absolute: 0 10*3/uL (ref 0.0–0.1)
Basophils Relative: 1 %
Eosinophils Absolute: 0.2 10*3/uL (ref 0.0–0.5)
Eosinophils Relative: 3 %
HCT: 42.1 % (ref 36.0–46.0)
Hemoglobin: 13.5 g/dL (ref 12.0–15.0)
Immature Granulocytes: 0 %
Lymphocytes Relative: 21 %
Lymphs Abs: 1.1 10*3/uL (ref 0.7–4.0)
MCH: 28.3 pg (ref 26.0–34.0)
MCHC: 32.1 g/dL (ref 30.0–36.0)
MCV: 88.3 fL (ref 80.0–100.0)
Monocytes Absolute: 0.5 10*3/uL (ref 0.1–1.0)
Monocytes Relative: 9 %
Neutro Abs: 3.6 10*3/uL (ref 1.7–7.7)
Neutrophils Relative %: 66 %
Platelet Count: 271 10*3/uL (ref 150–400)
RBC: 4.77 MIL/uL (ref 3.87–5.11)
RDW: 14.5 % (ref 11.5–15.5)
WBC Count: 5.4 10*3/uL (ref 4.0–10.5)
nRBC: 0 % (ref 0.0–0.2)

## 2021-01-09 SURGERY — INSERTION, TUNNELED CENTRAL VENOUS DEVICE, WITH PORT
Anesthesia: General

## 2021-01-09 MED ORDER — HEPARIN SOD (PORK) LOCK FLUSH 100 UNIT/ML IV SOLN
INTRAVENOUS | Status: AC
  Filled 2021-01-09: qty 5

## 2021-01-09 MED ORDER — ONDANSETRON HCL 4 MG/2ML IJ SOLN
INTRAMUSCULAR | Status: DC | PRN
  Administered 2021-01-09: 4 mg via INTRAVENOUS

## 2021-01-09 MED ORDER — LIDOCAINE-PRILOCAINE 2.5-2.5 % EX CREA: TOPICAL_CREAM | CUTANEOUS | 3 refills | Status: DC

## 2021-01-09 MED ORDER — IBUPROFEN 800 MG PO TABS: 800.0000 mg | ORAL_TABLET | Freq: Three times a day (TID) | ORAL | 0 refills | Status: DC | PRN

## 2021-01-09 MED ORDER — LORAZEPAM 0.5 MG PO TABS: 0.5000 mg | ORAL_TABLET | Freq: Two times a day (BID) | ORAL | 0 refills | Status: DC | PRN

## 2021-01-09 MED ORDER — PROPOFOL 10 MG/ML IV BOLUS
INTRAVENOUS | Status: DC | PRN
  Administered 2021-01-09: 200 mg via INTRAVENOUS

## 2021-01-09 MED ORDER — FENTANYL CITRATE (PF) 100 MCG/2ML IJ SOLN
INTRAMUSCULAR | Status: AC
  Filled 2021-01-09: qty 2

## 2021-01-09 MED ORDER — PROCHLORPERAZINE MALEATE 10 MG PO TABS: 10.0000 mg | ORAL_TABLET | Freq: Four times a day (QID) | ORAL | 1 refills | Status: DC | PRN

## 2021-01-09 MED ORDER — DEXAMETHASONE SODIUM PHOSPHATE 10 MG/ML IJ SOLN
INTRAMUSCULAR | Status: AC
  Filled 2021-01-09: qty 1

## 2021-01-09 MED ORDER — TRAMADOL HCL 50 MG PO TABS: 50.0000 mg | ORAL_TABLET | Freq: Three times a day (TID) | ORAL | 0 refills | Status: AC | PRN

## 2021-01-09 MED ORDER — HEPARIN SODIUM (PORCINE) 1000 UNIT/ML IJ SOLN
INTRAMUSCULAR | Status: AC
  Filled 2021-01-09: qty 1

## 2021-01-09 MED ORDER — LACTATED RINGERS IV SOLN: INTRAVENOUS | Status: DC

## 2021-01-09 MED ORDER — CHLORHEXIDINE GLUCONATE CLOTH 2 % EX PADS: 6.0000 | MEDICATED_PAD | Freq: Once | CUTANEOUS | Status: DC

## 2021-01-09 MED ORDER — SODIUM CHLORIDE 0.9 % IV SOLN
2.0000 g | INTRAVENOUS | Status: AC
  Administered 2021-01-09: 2 g via INTRAVENOUS
  Filled 2021-01-09: qty 2

## 2021-01-09 MED ORDER — LIDOCAINE 2% (20 MG/ML) 5 ML SYRINGE
INTRAMUSCULAR | Status: AC
  Filled 2021-01-09: qty 5

## 2021-01-09 MED ORDER — FENTANYL CITRATE (PF) 100 MCG/2ML IJ SOLN: 25.0000 ug | INTRAMUSCULAR | Status: DC | PRN

## 2021-01-09 MED ORDER — DEXAMETHASONE 4 MG PO TABS: ORAL_TABLET | ORAL | 6 refills | Status: DC

## 2021-01-09 MED ORDER — HEPARIN 6000 UNIT IRRIGATION SOLUTION
Freq: Once | Status: AC
  Administered 2021-01-09: 1
  Filled 2021-01-09: qty 6000

## 2021-01-09 MED ORDER — ONDANSETRON HCL 8 MG PO TABS: 8.0000 mg | ORAL_TABLET | Freq: Three times a day (TID) | ORAL | 1 refills | Status: DC | PRN

## 2021-01-09 MED ORDER — OXYCODONE HCL 5 MG PO TABS
5.0000 mg | ORAL_TABLET | Freq: Once | ORAL | Status: DC | PRN
Start: 2021-01-09 — End: 2021-01-09

## 2021-01-09 MED ORDER — 0.9 % SODIUM CHLORIDE (POUR BTL) OPTIME
TOPICAL | Status: DC | PRN
Start: 2021-01-09 — End: 2021-01-09
  Administered 2021-01-09: 1000 mL

## 2021-01-09 MED ORDER — HEPARIN SOD (PORK) LOCK FLUSH 100 UNIT/ML IV SOLN
INTRAVENOUS | Status: DC | PRN
  Administered 2021-01-09: 500 [IU]

## 2021-01-09 MED ORDER — BUPIVACAINE-EPINEPHRINE (PF) 0.25% -1:200000 IJ SOLN
INTRAMUSCULAR | Status: AC
  Filled 2021-01-09: qty 30

## 2021-01-09 MED ORDER — ORAL CARE MOUTH RINSE: 15.0000 mL | Freq: Once | OROMUCOSAL | Status: AC

## 2021-01-09 MED ORDER — ACETAMINOPHEN 160 MG/5ML PO SOLN: 325.0000 mg | ORAL | Status: DC | PRN

## 2021-01-09 MED ORDER — FENTANYL CITRATE (PF) 100 MCG/2ML IJ SOLN
INTRAMUSCULAR | Status: DC | PRN
  Administered 2021-01-09 (×4): 50 ug via INTRAVENOUS

## 2021-01-09 MED ORDER — ACETAMINOPHEN 500 MG PO TABS
1000.0000 mg | ORAL_TABLET | ORAL | Status: AC
  Administered 2021-01-09: 1000 mg via ORAL
  Filled 2021-01-09: qty 2

## 2021-01-09 MED ORDER — ONDANSETRON HCL 4 MG/2ML IJ SOLN: 4.0000 mg | Freq: Once | INTRAMUSCULAR | Status: DC | PRN

## 2021-01-09 MED ORDER — ACETAMINOPHEN 325 MG PO TABS: 325.0000 mg | ORAL_TABLET | ORAL | Status: DC | PRN

## 2021-01-09 MED ORDER — CHLORHEXIDINE GLUCONATE 0.12 % MT SOLN
15.0000 mL | Freq: Once | OROMUCOSAL | Status: AC
  Administered 2021-01-09: 15 mL via OROMUCOSAL

## 2021-01-09 MED ORDER — OXYCODONE HCL 5 MG/5ML PO SOLN: 5.0000 mg | Freq: Once | ORAL | Status: DC | PRN

## 2021-01-09 MED ORDER — ONDANSETRON HCL 4 MG/2ML IJ SOLN
INTRAMUSCULAR | Status: AC
  Filled 2021-01-09: qty 2

## 2021-01-09 MED ORDER — CELECOXIB 200 MG PO CAPS
400.0000 mg | ORAL_CAPSULE | ORAL | Status: AC
Start: 2021-01-09 — End: 2021-01-09
  Administered 2021-01-09: 400 mg via ORAL
  Filled 2021-01-09: qty 2

## 2021-01-09 MED ORDER — MEPERIDINE HCL 50 MG/ML IJ SOLN: 6.2500 mg | INTRAMUSCULAR | Status: DC | PRN

## 2021-01-09 MED ORDER — PROPOFOL 10 MG/ML IV BOLUS
INTRAVENOUS | Status: AC
  Filled 2021-01-09: qty 20

## 2021-01-09 MED ORDER — DEXAMETHASONE SODIUM PHOSPHATE 10 MG/ML IJ SOLN
INTRAMUSCULAR | Status: DC | PRN
  Administered 2021-01-09: 10 mg via INTRAVENOUS

## 2021-01-09 MED ORDER — BUPIVACAINE-EPINEPHRINE 0.25% -1:200000 IJ SOLN
INTRAMUSCULAR | Status: DC | PRN
  Administered 2021-01-09: 10 mL

## 2021-01-09 MED ORDER — LIDOCAINE 2% (20 MG/ML) 5 ML SYRINGE
INTRAMUSCULAR | Status: DC | PRN
  Administered 2021-01-09: 60 mg via INTRAVENOUS

## 2021-01-09 SURGICAL SUPPLY — 41 items
ADH SKN CLS APL DERMABOND .7 (GAUZE/BANDAGES/DRESSINGS) ×1
APL PRP STRL LF DISP 70% ISPRP (MISCELLANEOUS) ×1
APL SKNCLS STERI-STRIP NONHPOA (GAUZE/BANDAGES/DRESSINGS) ×1
BAG COUNTER SPONGE SURGICOUNT (BAG) IMPLANT
BAG DECANTER FOR FLEXI CONT (MISCELLANEOUS) ×2 IMPLANT
BAG SPNG CNTER NS LX DISP (BAG)
BENZOIN TINCTURE PRP APPL 2/3 (GAUZE/BANDAGES/DRESSINGS) ×2 IMPLANT
BLADE SURG 15 STRL LF DISP TIS (BLADE) ×1 IMPLANT
BLADE SURG 15 STRL SS (BLADE) ×2
BLADE SURG SZ11 CARB STEEL (BLADE) ×2 IMPLANT
CHLORAPREP W/TINT 26 (MISCELLANEOUS) ×2 IMPLANT
COVER SURGICAL LIGHT HANDLE (MISCELLANEOUS) ×2 IMPLANT
DECANTER SPIKE VIAL GLASS SM (MISCELLANEOUS) ×2 IMPLANT
DERMABOND ADVANCED (GAUZE/BANDAGES/DRESSINGS) ×1
DERMABOND ADVANCED .7 DNX12 (GAUZE/BANDAGES/DRESSINGS) ×1 IMPLANT
DRAPE C-ARM 42X120 X-RAY (DRAPES) ×2 IMPLANT
DRAPE LAPAROSCOPIC ABDOMINAL (DRAPES) ×2 IMPLANT
ELECT REM PT RETURN 15FT ADLT (MISCELLANEOUS) ×2 IMPLANT
GAUZE 4X4 16PLY ~~LOC~~+RFID DBL (SPONGE) ×2 IMPLANT
GAUZE SPONGE 4X4 12PLY STRL (GAUZE/BANDAGES/DRESSINGS) ×2 IMPLANT
GLOVE SURG POLYISO LF SZ7 (GLOVE) ×2 IMPLANT
GLOVE SURG UNDER POLY LF SZ7 (GLOVE) ×2 IMPLANT
GOWN STRL REUS W/TWL LRG LVL3 (GOWN DISPOSABLE) ×2 IMPLANT
GOWN STRL REUS W/TWL XL LVL3 (GOWN DISPOSABLE) ×2 IMPLANT
KIT BASIN OR (CUSTOM PROCEDURE TRAY) ×2 IMPLANT
KIT PORT POWER 8FR ISP CVUE (Port) ×2 IMPLANT
KIT TURNOVER KIT A (KITS) ×2 IMPLANT
NEEDLE HYPO 22GX1.5 SAFETY (NEEDLE) ×2 IMPLANT
PACK BASIC VI WITH GOWN DISP (CUSTOM PROCEDURE TRAY) ×2 IMPLANT
PENCIL SMOKE EVACUATOR (MISCELLANEOUS) ×2 IMPLANT
SUT MNCRL AB 4-0 PS2 18 (SUTURE) ×2 IMPLANT
SUT PROLENE 2 0 SH DA (SUTURE) ×2 IMPLANT
SUT VIC AB 2-0 SH 18 (SUTURE) IMPLANT
SUT VIC AB 2-0 SH 27 (SUTURE)
SUT VIC AB 2-0 SH 27X BRD (SUTURE) IMPLANT
SUT VIC AB 3-0 SH 27 (SUTURE) ×2
SUT VIC AB 3-0 SH 27XBRD (SUTURE) ×1 IMPLANT
SYR 10ML LL (SYRINGE) ×2 IMPLANT
SYR 20ML LL LF (SYRINGE) ×2 IMPLANT
TOWEL OR 17X26 10 PK STRL BLUE (TOWEL DISPOSABLE) ×2 IMPLANT
TOWEL OR NON WOVEN STRL DISP B (DISPOSABLE) ×2 IMPLANT

## 2021-01-09 NOTE — Transfer of Care (Signed)
Immediate Anesthesia Transfer of Care Note  Patient: Brittany Middleton  Procedure(s) Performed: PORT INSERTION WITH [place] & FLUORO  Patient Location: PACU  Anesthesia Type:General  Level of Consciousness: awake, alert , oriented and patient cooperative  Airway & Oxygen Therapy: Patient Spontanous Breathing  Post-op Assessment: Report given to RN and Post -op Vital signs reviewed and stable  Post vital signs: Reviewed and stable  Last Vitals:  Vitals Value Taken Time  BP 147/64 01/09/21 1730  Temp    Pulse 51 01/09/21 1734  Resp 14 01/09/21 1734  SpO2 100 % 01/09/21 1734  Vitals shown include unvalidated device data.  Last Pain:  Vitals:   01/09/21 1730  TempSrc:   PainSc: 2          Complications: No notable events documented.

## 2021-01-09 NOTE — Discharge Instructions (Signed)
PORT-A-CATH: POST OP INSTRUCTIONS  Always review your discharge instruction sheet given to you by the facility where your surgery was performed.   A prescription for pain medication may be given to you upon discharge. Take your pain medication as prescribed, if needed. If narcotic pain medicine is not needed, then you make take acetaminophen (Tylenol) or ibuprofen (Advil) as needed.  Take your usually prescribed medications unless otherwise directed. If you need a refill on your pain medication, please contact our office. All narcotic pain medicine now requires a paper prescription.  Phoned in and fax refills are no longer allowed by law.  Prescriptions will not be filled after 5 pm or on weekends.  You should follow a light diet for the remainder of the day after your procedure. Most patients will experience some mild swelling and/or bruising in the area of the incision. It may take several days to resolve. It is common to experience some constipation if taking pain medication after surgery. Increasing fluid intake and taking a stool softener (such as Colace) will usually help or prevent this problem from occurring. A mild laxative (Milk of Magnesia or Miralax) should be taken according to package directions if there are no bowel movements after 48 hours.  Unless discharge instructions indicate otherwise, you may remove your bandages 48 hours after surgery, and you may shower at that time. You may have steri-strips (small white skin tapes) in place directly over the incision.  These strips should be left on the skin for 7-10 days.  If your surgeon used Dermabond (skin glue) on the incision, you may shower in 24 hours.  The glue will flake off over the next 2-3 weeks.  If your port is left accessed at the end of surgery (needle left in port), the dressing cannot get wet and should only by changed by a healthcare professional. When the port is no longer accessed (when the needle has been removed), follow  step 7.   ACTIVITIES:  Limit activity involving your arms for the next 72 hours. Do no strenuous exercise or activity for 1 week. You may drive when you are no longer taking prescription pain medication, you can comfortably wear a seatbelt, and you can maneuver your car. 10.You may need to see your doctor in the office for a follow-up appointment.  Please       check with your doctor.  11.When you receive a new Port-a-Cath, you will get a product guide and        ID card.  Please keep them in case you need them.  WHEN TO CALL YOUR DOCTOR ([phone]): Fever over 101.0 Chills Continued bleeding from incision Increased redness and tenderness at the site Shortness of breath, difficulty breathing   The clinic staff is available to answer your questions during regular business hours. Please don't hesitate to call and ask to speak to one of the nurses or medical assistants for clinical concerns. If you have a medical emergency, go to the nearest emergency room or call 911.  A surgeon from [facility] is always on call at the hospital.     For further information, please visit [email or web address]      

## 2021-01-09 NOTE — Anesthesia Preprocedure Evaluation (Signed)
Anesthesia Evaluation  Patient identified by MRN, date of birth, ID band Patient awake    Reviewed: Allergy & Precautions, NPO status , Patient's Chart, lab work & pertinent test results  History of Anesthesia Complications Negative for: history of anesthetic complications  Airway Mallampati: II  TM Distance: >3 FB Neck ROM: Full    Dental  (+) Dental Advisory Given, Teeth Intact, Caps   Pulmonary asthma , sleep apnea and Continuous Positive Airway Pressure Ventilation ,    Pulmonary exam normal        Cardiovascular hypertension, Pt. on medications + angina + CAD  Normal cardiovascular exam   AAA  '21 TTE - EF 55 to 60%. Left atrial size was mildly dilated.     Neuro/Psych negative neurological ROS  negative psych ROS   GI/Hepatic Neg liver ROS, hiatal hernia, GERD  Medicated and Controlled,  Endo/Other  Morbid obesity K 3.3   Renal/GU negative Renal ROS     Musculoskeletal negative musculoskeletal ROS (+)   Abdominal   Peds  Hematology  On plavix    Anesthesia Other Findings   Reproductive/Obstetrics                             Anesthesia Physical  Anesthesia Plan  ASA: 3  Anesthesia Plan: General   Post-op Pain Management:    Induction: Intravenous  PONV Risk Score and Plan: 3 and Treatment may vary due to age or medical condition, Ondansetron, Dexamethasone and Propofol infusion  Airway Management Planned: LMA  Additional Equipment: None  Intra-op Plan:   Post-operative Plan: Extubation in OR  Informed Consent: I have reviewed the patients History and Physical, chart, labs and discussed the procedure including the risks, benefits and alternatives for the proposed anesthesia with the patient or authorized representative who has indicated his/her understanding and acceptance.     Dental advisory given  Plan Discussed with: CRNA and  Anesthesiologist  Anesthesia Plan Comments:         Anesthesia Quick Evaluation

## 2021-01-09 NOTE — Telephone Encounter (Signed)
Called Juliann Pulse and moved up her apt with Dr. [name] to 12:30 today with labs to follow.

## 2021-01-09 NOTE — Op Note (Signed)
Preoperative diagnosis: cancer  Postoperative diagnosis: same  Procedure: insertion of right IJ port-a-cath with ultrasound and fluoro guidance  Surgeon: Gurney Maxin, M.D.  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 She presents for port to begin chemotherapy.  Description of procedure: The patient was brought into the operative suite, anesthesia was administered with ETT, both arms were tucked with offloading foam over all pressure points. The patient was prepped and draped in the usual sterile fashion. Next WHO checklist was completed. The patient was put in trendelenberg, the [place] was used to assess anatomy and the RIJ was large and lay anterior the the common carotid artery A 18ga needle was used to gain access to the RIJ using ultrasound guidance. Nonpulsatile flow was seen and the J-wire was advanced without tension. XR was used to confirm placement within the venous system. No arrthymias were seen, the wire was clamped in place. The percutaneous access site was widened with a 11 blade. Local anesthesia was used to anesthetize the space inferior to the right clavicle. Next a 3cm incision was made 2cm inferior to the clavicle. Cautery was used to create a pocket along the fascia inferior to incision. The tunneling device was used to make a wide turn from the pocket up to the perc access site. The introducer and sheath were then thread over the J wire in seldinger technique and wire was removed. Next the catheter was thread from the incision to the access site and inserted into the sheath after the introducer was removed. Sheath was then pulled and removed. XR showed the tip of catheter to be at the atrial-caval junction. The port was then attached to the catheter cutting the catheter at appropriate length. The port was then placed in pocket and sewed to the fascia in two  places with a 2-0 prolene. The port was accessed with huber needel and flushed and drew easily and the port was flushed with concentrated heparin. The incision was closed with 3-0 vicryl followed with 4-0 monocryl in running subcu fashion. A single 4-0 was used to close the access site. Dermabond was placed for dressing. Patient was extubated and brought to pacu in stable condition.  Findings: patent RIJ, catheter tip at the SVC atrial junction  Specimen: none  Blood loss: 10cc  Local anesthesia: 10cc 0.25% marcaine w epi  Complications: none  Gurney Maxin, M.D. General, Bariatric, & Minimally Invasive Surgery [facility]

## 2021-01-09 NOTE — Assessment & Plan Note (Addendum)
I have reviewed outside records She has obtained second opinion and desire for her surgical debulking surgery to be done at [facility] We will hold off ordering molecular testing on recent peritoneal biopsy She will proceed with port placement as scheduled I have schedule chemo education class on Monday and she will proceed with treatment on Monday I will see her within the week after treatment for toxicity review We reviewed the NCCN guidelines We discussed the role of chemotherapy. The intent is of curative intent.  We discussed some of the risks, benefits, side-effects of carboplatin & Taxol. Treatment is intravenous, every 3 weeks x 6 cycles  Some of the short term side-effects included, though not limited to, including weight loss, life threatening infections, risk of allergic reactions, need for transfusions of blood products, nausea, vomiting, change in bowel habits, loss of hair, admission to hospital for various reasons, and risks of death.   Long term side-effects are also discussed including risks of infertility, permanent damage to nerve function, hearing loss, chronic fatigue, kidney damage with possibility needing hemodialysis, and rare secondary malignancy including bone marrow disorders.  The patient is aware that the response rates discussed earlier is not guaranteed.  After a long discussion, patient made an informed decision to proceed with the prescribed plan of care.   Patient education material was dispensed. We discussed premedication with dexamethasone before chemotherapy.

## 2021-01-09 NOTE — Progress Notes (Signed)
Met with Brittany Middleton and her sons after her appointment with Dr. [name].  Provided her with the [place] folder and encouraged her to call with any questions or needs.

## 2021-01-09 NOTE — Anesthesia Procedure Notes (Signed)
Procedure Name: LMA Insertion Date/Time: 01/09/2021 4:32 PM Performed by: Lollie Sails, CRNA Pre-anesthesia Checklist: Patient identified, Emergency Drugs available, Suction available, Patient being monitored and Timeout performed Patient Re-evaluated:Patient Re-evaluated prior to induction Oxygen Delivery Method: Circle system utilized Preoxygenation: Pre-oxygenation with 100% oxygen Induction Type: IV induction Ventilation: Mask ventilation without difficulty LMA: LMA inserted LMA Size: 4.0 Number of attempts: 1 Placement Confirmation: positive ETCO2 and breath sounds checked- equal and bilateral Tube secured with: Tape Dental Injury: Teeth and Oropharynx as per pre-operative assessment

## 2021-01-09 NOTE — Anesthesia Postprocedure Evaluation (Signed)
Anesthesia Post Note  Patient: Jaziah Heninger  Procedure(s) Performed: PORT INSERTION WITH [place] & FLUORO     Patient location during evaluation: PACU Anesthesia Type: General Level of consciousness: awake and alert Pain management: pain level controlled Vital Signs Assessment: post-procedure vital signs reviewed and stable Respiratory status: spontaneous breathing, nonlabored ventilation, respiratory function stable and patient connected to nasal cannula oxygen Cardiovascular status: blood pressure returned to baseline and stable Postop Assessment: no apparent nausea or vomiting Anesthetic complications: no   No notable events documented.  Last Vitals:  Vitals:   01/09/21 1800 01/09/21 1810  BP: (!) 156/60 128/71  Pulse: (!) 55 (!) 54  Resp: 18 14  Temp: 36.5 C   SpO2: 97% 97%    Last Pain:  Vitals:   01/09/21 1810  TempSrc:   PainSc: 0-No pain                 Jamerion Cabello

## 2021-01-09 NOTE — H&P (Signed)
Brittany Middleton is an 70 y.o. female.   Chief Complaint: new cancer HPI: 70 yo female with new gyn cancer diagnosis. She presents for port insertion.  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   Gastrointestinal bleeding 07/25/2018   Gastrointestinal hemorrhage with melena 07/16/2018   GERD (gastroesophageal reflux disease)    Hepatic steatosis    per imaging 2016, 2019   Hiatal hernia    History of palpitations    Holter monitor, 2017: NSR, occasional PVC, PACs, no arrhythmia   HLD (hyperlipidemia) 02/27/2018   HTN (hypertension) 02/27/2018   Hypercholesteremia    Hypercholesterolemia 04/07/2017   Hypertension    Hypertensive disorder 04/07/2017   Hypokalemia 07/17/2018   Lumbar disc disease    MRI, 2017    Morbid obesity ([place]) 05/22/2018   Overweight 03/03/2018   Palpitations 08/19/2015   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[place])    Pneumonia    Pyelonephritis 03/30/2020   Sepsis secondary to UTI ([place]) 04/25/2020   Severe sepsis with acute organ dysfunction ([place]) 04/25/2020   Sleep apnea    UTI (urinary tract infection)     Past Surgical History:  Procedure Laterality Date   BLADDER REPAIR     BREAST EXCISIONAL BIOPSY Left    CARPAL TUNNEL RELEASE Right    CATARACT EXTRACTION, BILATERAL  2014   COLONOSCOPY WITH PROPOFOL N/A 07/18/2018   Procedure: COLONOSCOPY WITH PROPOFOL;  Surgeon: Laurence Spates, MD;  Location: [place];  Service: Endoscopy;  Laterality: N/A;   ESOPHAGOGASTRODUODENOSCOPY (EGD) WITH PROPOFOL N/A 07/17/2018   Procedure: ESOPHAGOGASTRODUODENOSCOPY (EGD) WITH PROPOFOL;  Surgeon: Laurence Spates, MD;  Location: [facility];  Service: Endoscopy;  Laterality: N/A;   LAPAROSCOPY N/A  12/30/2020   Procedure: LAPAROSCOPY DIAGNOSTIC; WITH  BIOPSY OF ABDOMINAL WALL NODULES AND BIOPSY OF PERICOLONIC EXUDATE;  Surgeon: Rhett Najera, Arta Bruce, MD;  Location: [facility];  Service: General;  Laterality: N/A;   REPAIR RECTOCELE     TOE SURGERY Left     Family History  Problem Relation Age of Onset   CAD Mother    Breast cancer Mother 44   Social History:  reports that she has never smoked. She has never used smokeless tobacco. She reports that she does not drink alcohol and does not use drugs.  Allergies:  Allergies  Allergen Reactions   Macrobid [Nitrofurantoin] Shortness Of Breath and Cough    Short of breath, cough, myalgia   Ciprofloxacin Other (See Comments)    Caused PAIN IN HANDS    Codeine Nausea And Vomiting   Azithromycin Palpitations    Medications Prior to Admission  Medication Sig Dispense Refill   acetaminophen (TYLENOL) 500 MG tablet Take 1,000 mg by mouth every 6 (six) hours as needed for mild pain, moderate pain, fever or headache.     albuterol (PROVENTIL) (2.5 MG/3ML) 0.083% nebulizer solution Take 2.5 mg by nebulization every 6 (six) hours as needed for wheezing or shortness of breath.     albuterol (VENTOLIN HFA) 108 (90 Base) MCG/ACT inhaler Inhale 2 puffs into the lungs every 6 (six) hours as needed for wheezing or shortness of breath. 8 g 2   alclomethasone (ACLOVATE) 0.05 % ointment Apply 1 application topically  2 (two) times daily.     BIOTIN PO Take 1 tablet by mouth daily.     Cholecalciferol (VITAMIN D-3) 25 MCG (1000 UT) CAPS Take 1,000 Units by mouth daily.     clopidogrel (PLAVIX) 75 MG tablet Take 1 tablet (75 mg total) by mouth daily. 90 tablet 3   co-enzyme Q-10 30 MG capsule Take 30 mg by mouth daily.     dicyclomine (BENTYL) 10 MG capsule Take 1 capsule (10 mg total) by mouth every 6 (six) hours as needed for up to 14 days for spasms (abdominal cramps). 56 capsule 0   estradiol (ESTRACE) 0.1 MG/GM vaginal cream Place 1 Applicatorful  vaginally 3 (three) times a week.     fluticasone (FLONASE) 50 MCG/ACT nasal spray Place 1 spray into both nostrils daily. (Patient taking differently: Place 1 spray into both nostrils daily as needed for allergies.) 1 g 2   hydrocortisone 2.5 % cream Apply 1 application topically daily as needed (forehead).      losartan (COZAAR) 50 MG tablet Take 1 tablet (50 mg total) by mouth daily. (Patient taking differently: Take 100 mg by mouth daily.) 30 tablet 2   Melatonin 5 MG CHEW Chew 5 mg by mouth at bedtime.      Multiple Vitamins-Minerals (MULTIVITAMIN WITH MINERALS) tablet Take 1 tablet by mouth daily.     omeprazole (PRILOSEC) 40 MG capsule Take 40 mg by mouth daily.     ondansetron (ZOFRAN ODT) 4 MG disintegrating tablet Take 1 tablet (4 mg total) by mouth every 8 (eight) hours as needed for nausea or vomiting. 20 tablet 0   pantoprazole (PROTONIX) 40 MG tablet Take 1 tablet (40 mg total) by mouth daily. (Patient taking differently: Take 40 mg by mouth daily as needed (acid reflux).) 30 tablet 1   polyethylene glycol (MIRALAX / GLYCOLAX) 17 g packet Take 8.5 g by mouth 2 (two) times daily.     rosuvastatin (CRESTOR) 20 MG tablet TAKE ONE TABLET BY MOUTH ONE TIME DAILY (Patient taking differently: Take 20 mg by mouth daily.) 90 tablet 0   dexamethasone (DECADRON) 4 MG tablet Take 2 tabs at the night before and 2 tab the morning of chemotherapy, every 3 weeks, by mouth x 6 cycles 36 tablet 6   fluticasone (FLOVENT HFA) 110 MCG/ACT inhaler Inhale 2 puffs into the lungs 2 (two) times daily. (Patient taking differently: Inhale 2 puffs into the lungs 2 (two) times daily as needed (shortness of breath or wheezing).) 1 each 6   lidocaine-prilocaine (EMLA) cream Apply to affected area once 30 g 3   LORazepam (ATIVAN) 0.5 MG tablet Take 1 tablet (0.5 mg total) by mouth 2 (two) times daily as needed for anxiety. 5 tablet 0   ondansetron (ZOFRAN) 8 MG tablet Take 1 tablet (8 mg total) by mouth every 8 (eight)  hours as needed for refractory nausea / vomiting. 30 tablet 1   PRESCRIPTION MEDICATION Inhale into the lungs at bedtime. CPAP     prochlorperazine (COMPAZINE) 10 MG tablet Take 1 tablet (10 mg total) by mouth every 6 (six) hours as needed (Nausea or vomiting). 30 tablet 1    Results for orders placed or performed in visit on 01/09/21 (from the past 48 hour(s))  CMP ([place] only)     Status: None   Collection Time: 01/09/21  1:28 PM  Result Value Ref Range   Sodium 141 135 - 145 mmol/L   Potassium 4.1 3.5 - 5.1 mmol/L   Chloride  103 98 - 111 mmol/L   CO2 28 22 - 32 mmol/L   Glucose, Bld 99 70 - 99 mg/dL    Comment: Glucose reference range applies only to samples taken after fasting for at least 8 hours.   BUN 9 8 - 23 mg/dL   Creatinine 0.67 0.44 - 1.00 mg/dL   Calcium 9.2 8.9 - 10.3 mg/dL   Total Protein 7.1 6.5 - 8.1 g/dL   Albumin 3.7 3.5 - 5.0 g/dL   AST 18 15 - 41 U/L   ALT 15 0 - 44 U/L   Alkaline Phosphatase 59 38 - 126 U/L   Total Bilirubin 0.7 0.3 - 1.2 mg/dL   GFR, Estimated >60 >60 mL/min    Comment: (NOTE) Calculated using the CKD-EPI Creatinine Equation (2021)    Anion gap 10 5 - 15    Comment: Performed at [facility], [place] [address], [place], [place] [address]  CBC with Differential ([place] Only)     Status: None   Collection Time: 01/09/21  1:28 PM  Result Value Ref Range   WBC Count 5.4 4.0 - 10.5 K/uL   RBC 4.77 3.87 - 5.11 MIL/uL   Hemoglobin 13.5 12.0 - 15.0 g/dL   HCT 42.1 36.0 - 46.0 %   MCV 88.3 80.0 - 100.0 fL   MCH 28.3 26.0 - 34.0 pg   MCHC 32.1 30.0 - 36.0 g/dL   RDW 14.5 11.5 - 15.5 %   Platelet Count 271 150 - 400 K/uL   nRBC 0.0 0.0 - 0.2 %   Neutrophils Relative % 66 %   Neutro Abs 3.6 1.7 - 7.7 K/uL   Lymphocytes Relative 21 %   Lymphs Abs 1.1 0.7 - 4.0 K/uL   Monocytes Relative 9 %   Monocytes Absolute 0.5 0.1 - 1.0 K/uL   Eosinophils Relative 3 %   Eosinophils Absolute 0.2 0.0 - 0.5 K/uL    Basophils Relative 1 %   Basophils Absolute 0.0 0.0 - 0.1 K/uL   Immature Granulocytes 0 %   Abs Immature Granulocytes 0.01 0.00 - 0.07 K/uL    Comment: Performed at [facility], [place] [address], [place],  [address]   No results found.  Review of Systems  Constitutional:  Negative for chills and fever.  HENT:  Negative for hearing loss.   Respiratory:  Negative for cough.   Cardiovascular:  Negative for chest pain and palpitations.  Gastrointestinal:  Negative for abdominal pain, nausea and vomiting.  Genitourinary:  Negative for dysuria and urgency.  Musculoskeletal:  Negative for myalgias and neck pain.  Skin:  Negative for rash.  Neurological:  Negative for dizziness and headaches.  Hematological:  Does not bruise/bleed easily.  Psychiatric/Behavioral:  Negative for suicidal ideas.    Blood pressure (!) 201/85, pulse 63, temperature 98.5 F (36.9 C), temperature source Oral, resp. rate 18, height '5\' 5"'$  (1.651 m), weight 113.4 kg, SpO2 97 %. Physical Exam Vitals reviewed.  Constitutional:      Appearance: She is well-developed.  HENT:     Head: Normocephalic and atraumatic.  Eyes:     Conjunctiva/sclera: Conjunctivae normal.     Pupils: Pupils are equal, round, and reactive to light.  Cardiovascular:     Rate and Rhythm: Normal rate and regular rhythm.  Pulmonary:     Effort: Pulmonary effort is normal.     Breath sounds: Normal breath sounds.  Abdominal:     General: Bowel sounds are normal.  There is no distension.     Palpations: Abdomen is soft.     Tenderness: There is no abdominal tenderness.  Musculoskeletal:        General: Normal range of motion.     Cervical back: Normal range of motion and neck supple.  Skin:    General: Skin is warm and dry.  Neurological:     Mental Status: She is alert and oriented to person, place, and time.  Psychiatric:        Behavior: Behavior normal.    Assessment/Plan 70 yo female with new  cancer diagnosis -port o cath insertion today  Mickeal Skinner, MD 01/09/2021, 2:19 PM

## 2021-01-10 ENCOUNTER — Encounter: Payer: Self-pay | Admitting: Hematology and Oncology

## 2021-01-10 DIAGNOSIS — R5381 Other malaise: Secondary | ICD-10-CM

## 2021-01-10 HISTORY — DX: Other malaise: R53.81

## 2021-01-10 NOTE — Assessment & Plan Note (Signed)
She has completed treatment and is not symptomatic

## 2021-01-10 NOTE — Assessment & Plan Note (Signed)
She is concerned about intracranial metastasis I will order MRI brain for eval and see her next week with results

## 2021-01-10 NOTE — Progress Notes (Signed)
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 The intent is of curative intent.  We discussed some of the risks, benefits, side-effects of carboplatin & Taxol. Treatment is intravenous, every 3 weeks x 6 cycles  Some of the short term side-effects included, though not limited to, including weight loss, life threatening infections, risk of allergic reactions, need for transfusions of blood products, nausea, vomiting, change in bowel habits, loss of hair, admission to hospital for various reasons, and risks of death.   Long term side-effects are also discussed including risks of infertility, permanent damage to nerve function, hearing loss, chronic fatigue, kidney damage with possibility needing hemodialysis, and rare secondary malignancy including bone marrow disorders.  The patient is aware that the response rates discussed earlier is not guaranteed.  After a long discussion, patient made an informed decision to proceed with the prescribed plan of care.   Patient education material was dispensed. We discussed premedication with dexamethasone before chemotherapy.  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    Answer:   Anti-anxiety    Order Specific Question:   Does the patient have a pacemaker or implanted devices?    Answer:   No    Order Specific Question:   Use SRS Protocol?    Answer:   No    Order Specific Question:   Preferred imaging location?    Answer:   [facility] (table limit 350lbs)   CBC with Differential ([place] Only)    Standing Status:   Standing    Number of Occurrences:   20    Standing Expiration Date:   01/09/2022   CMP ([place] only)    Standing Status:   Standing    Number of Occurrences:   20    Standing Expiration Date:   01/09/2022   Ambulatory referral to Physical Therapy    Referral Priority:   Routine    Referral Type:   Physical Medicine    Referral Reason:   Specialty Services Required  Requested Specialty:   Physical Therapy    Number of Visits Requested:   1    All questions were answered. The patient knows to call the clinic with any problems, questions or concerns. The total time spent in the appointment was 70 minutes encounter with patients including review of chart and various tests results, discussions about plan of care and coordination of care plan   Brittany Lark, MD 01/10/2021 10:07  AM  INTERVAL HISTORY: Please see below for problem oriented charting. 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 - Diverticulosis in the sigmoid colon and in the descending colon. - No specimens collected. - Blood in stool without cause found on endoscopic exam   05/08/2020 Imaging   1. Bladder is decompressed though demonstrates significant perivesicular hazy stranding, mucosal hyperemia and edematous mural thickening. Findings are suggestive of cystitis. Correlate with urinalysis. No abnormal perinephric or periureteral stranding or other features to suggest an ascending tract infection at this time. 2. Colonic diverticulosis without evidence of acute diverticulitis. 3. Aortic Atherosclerosis (ICD10-I70.0).   12/28/2020 Imaging   1. 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 Differential for peritoneal carcinomatosis is primarily neoplastic and includes cancers of the ovary, appendix, colon, pancreas and stomach. PET-CT may be helpful for further characterization. Tissue sampling of the mesentery may be eventually required for diagnosis. 2. Mildly  distended small bowel loops throughout the abdomen and pelvis, with fluid and associated air-fluid levels throughout the nondistended small bowel, suggesting a mild ileus versus partial small bowel obstruction. Favor partial small bowel obstruction with transition zone in the RIGHT lower quadrant, likely related to aforementioned neoplastic peritoneal implants and/or adhesions. 3. Small free fluid within the RIGHT upper quadrant and LEFT upper quadrant. No abscess collection seen. No free intraperitoneal air seen. 4. Extensive colonic diverticulosis without evidence of acute diverticulitis. 5. Small chronic pleural effusions with associated atelectasis.   12/29/2020 Tumor Marker   Patient's tumor was tested for the following markers: CA-125. Results of the tumor marker test revealed 95.1.   12/30/2020 Surgery   Preoperative diagnosis: abdominal nodules   Postoperative diagnosis: same   Procedure: diagnostic laparoscopy with abdominal wall biopsy   Surgeon: Gurney Maxin, M.D.    Indications for procedure: Brittany [facility] is a 70 y.o. year old female with symptoms of nausea, vomiting, and abdominal pain. Work up was concerning for cancer with abdominal wall studding.   Description of procedure: The patient was brought into the operative suite. Anesthesia was administered with General endotracheal anesthesia. WHO checklist was applied. The patient was then placed in supine position. The area was prepped and draped in the usual sterile fashion.   A small left subcostal incision was made. A 73m trocar was used to gain  access to the peritoneal cavity by optical entry technique. Pneumoperitoneum was applied with a high flow and low pressure. The laparoscope was reinserted to confirm position.   On initial visualization of the abdomen, there were multiple nodules throughout the abdominal wall. There were multiple nodules over the small intestine and mesentery. There was some loops of small  intestine that were matted together. There was a large white mass in the left upper quadrant. Multiple areas of peritoneum were removed with harmonic scalpel. The large mass was inspected. It appeared to be related to the colon and omentum. Grasper was used to remove some superficial tissue and was sent for culture.   The abdomen was desufflated. The incisions were closed with 4-0 monocryl subcuticular stitch.   12/30/2020 Pathology Results   FINAL MICROSCOPIC DIAGNOSIS:   A. ABDOMINAL WALL, NODULE, EXCISION:  -  Metastatic adenocarcinoma  -  See comment   B. PERI COLONIC EXUDATE, EXCISION:  -  Metastatic adenocarcinoma  -  See comment   COMMENT:   By immunohistochemistry, the neoplastic cells are positive for cytokeratin 7, PAX8 and WT1 but negative for cytokeratin 20, CDX2, p53 and TTF-1.  The morphology and immunophenotype are consistent with a gynecologic primary.     01/01/2021 Initial Diagnosis   Peritoneal carcinomatosis ([place])    01/08/2021 Cancer Staging   Staging form: Ovary, Fallopian Tube, and Primary Peritoneal Carcinoma, AJCC 8th Edition - Clinical stage from 01/08/2021: cT3, cN0, cM0 - Signed by Brittany Lark, MD on 01/08/2021  Stage prefix: Initial diagnosis    01/12/2021 -  Chemotherapy    Patient is on Treatment Plan: OVARIAN CARBOPLATIN (AUC 6) / PACLITAXEL (175) Q21D X 6 CYCLES         REVIEW OF SYSTEMS:   Constitutional: Denies fevers, chills or abnormal weight loss Eyes: Denies blurriness of vision Ears, nose, mouth, throat, and face: Denies mucositis or sore throat Respiratory: Denies cough, dyspnea or wheezes Cardiovascular: Denies palpitation, chest discomfort or lower extremity swelling Skin: Denies abnormal skin rashes Lymphatics: Denies new lymphadenopathy or easy bruising Behavioral/Psych: Mood is stable, no new changes  All other systems were reviewed with the patient and are negative.  I have reviewed the past medical history, past surgical  history, social history and family history with the patient and they are unchanged from previous note.  ALLERGIES:  is allergic to macrobid [nitrofurantoin], ciprofloxacin, codeine, and azithromycin.  MEDICATIONS:  Current Outpatient Medications  Medication Sig Dispense Refill   dexamethasone (DECADRON) 4 MG tablet Take 2 tabs at the night before and 2 tab the morning of chemotherapy, every 3 weeks, by mouth x 6 cycles 36 tablet 6   LORazepam (ATIVAN) 0.5 MG tablet Take 1 tablet (0.5 mg total) by mouth 2 (two) times daily as needed for anxiety. 5 tablet 0   acetaminophen (TYLENOL) 500 MG tablet Take 1,000 mg by mouth every 6 (six) hours as needed for mild pain, moderate pain, fever or headache.     albuterol (PROVENTIL) (2.5 MG/3ML) 0.083% nebulizer solution Take 2.5 mg by nebulization every 6 (six) hours as needed for wheezing or shortness of breath.     albuterol (VENTOLIN HFA) 108 (90 Base) MCG/ACT inhaler Inhale 2 puffs into the lungs every 6 (six) hours as needed for wheezing or shortness of breath. 8 g 2   alclomethasone (ACLOVATE) 0.05 % ointment Apply 1 application topically 2 (two) times daily.     BIOTIN PO Take 1 tablet by mouth daily.     Cholecalciferol (VITAMIN D-3)  25 MCG (1000 UT) CAPS Take 1,000 Units by mouth daily.     clopidogrel (PLAVIX) 75 MG tablet Take 1 tablet (75 mg total) by mouth daily. 90 tablet 3   co-enzyme Q-10 30 MG capsule Take 30 mg by mouth daily.     dicyclomine (BENTYL) 10 MG capsule Take 1 capsule (10 mg total) by mouth every 6 (six) hours as needed for up to 14 days for spasms (abdominal cramps). 56 capsule 0   estradiol (ESTRACE) 0.1 MG/GM vaginal cream Place 1 Applicatorful vaginally 3 (three) times a week.     fluticasone (FLONASE) 50 MCG/ACT nasal spray Place 1 spray into both nostrils daily. 1 g 2   fluticasone (FLOVENT HFA) 110 MCG/ACT inhaler Inhale 2 puffs into the lungs 2 (two) times daily. 1 each 6   fosfomycin (MONUROL) 3 g PACK Take 3 g by  mouth once.     hydrocortisone 2.5 % cream Apply 1 application topically daily as needed (forehead).      ibuprofen (ADVIL) 800 MG tablet Take 1 tablet (800 mg total) by mouth every 8 (eight) hours as needed. 30 tablet 0   lidocaine-prilocaine (EMLA) cream Apply to affected area once 30 g 3   losartan (COZAAR) 50 MG tablet Take 1 tablet (50 mg total) by mouth daily. 30 tablet 2   Melatonin 5 MG CHEW Chew 5 mg by mouth at bedtime.      Multiple Vitamins-Minerals (MULTIVITAMIN WITH MINERALS) tablet Take 1 tablet by mouth daily.     omeprazole (PRILOSEC) 40 MG capsule Take 40 mg by mouth daily.     ondansetron (ZOFRAN ODT) 4 MG disintegrating tablet Take 1 tablet (4 mg total) by mouth every 8 (eight) hours as needed for nausea or vomiting. 20 tablet 0   ondansetron (ZOFRAN) 8 MG tablet Take 1 tablet (8 mg total) by mouth every 8 (eight) hours as needed for refractory nausea / vomiting. 30 tablet 1   pantoprazole (PROTONIX) 40 MG tablet Take 1 tablet (40 mg total) by mouth daily. 30 tablet 1   polyethylene glycol (MIRALAX / GLYCOLAX) 17 g packet Take 8.5 g by mouth 2 (two) times daily.     PRESCRIPTION MEDICATION Inhale into the lungs at bedtime. CPAP     prochlorperazine (COMPAZINE) 10 MG tablet Take 1 tablet (10 mg total) by mouth every 6 (six) hours as needed (Nausea or vomiting). 30 tablet 1   rosuvastatin (CRESTOR) 20 MG tablet TAKE ONE TABLET BY MOUTH ONE TIME DAILY 90 tablet 0   traMADol (ULTRAM) 50 MG tablet Take 1 tablet (50 mg total) by mouth every 8 (eight) hours as needed for up to 5 days. 15 tablet 0   No current facility-administered medications for this visit.    PHYSICAL EXAMINATION: ECOG PERFORMANCE STATUS: 1 - Symptomatic but completely ambulatory  Vitals:   01/09/21 1222  BP: (!) 142/66  Pulse: 66  Resp: 18  Temp: 98.3 F (36.8 C)  SpO2: 97%   Filed Weights   01/09/21 1222  Weight: 252 lb (114.3 kg)    GENERAL:alert, no distress and comfortable SKIN: skin color,  texture, turgor are normal, no rashes or significant lesions EYES: normal, Conjunctiva are pink and non-injected, sclera clear OROPHARYNX:no exudate, no erythema and lips, buccal mucosa, and tongue normal  NECK: supple, thyroid normal size, non-tender, without nodularity LYMPH:  no palpable lymphadenopathy in the cervical, axillary or inguinal LUNGS: clear to auscultation and percussion with normal breathing effort HEART: regular rate & rhythm and no  murmurs and no lower extremity edema ABDOMEN:abdomen soft, non-tender and normal bowel sounds. Noted well healed surgical scars Musculoskeletal:no cyanosis of digits and no clubbing  NEURO: alert & oriented x 3 with fluent speech, no focal motor/sensory deficits  LABORATORY DATA:  I have reviewed the data as listed    Component Value Date/Time   NA 141 01/09/2021 1328   NA 143 12/20/2019 0829   K 4.1 01/09/2021 1328   CL 103 01/09/2021 1328   CO2 28 01/09/2021 1328   GLUCOSE 99 01/09/2021 1328   BUN 9 01/09/2021 1328   BUN 16 12/20/2019 0829   CREATININE 0.67 01/09/2021 1328   CALCIUM 9.2 01/09/2021 1328   PROT 7.1 01/09/2021 1328   PROT 6.4 02/04/2020 1036   ALBUMIN 3.7 01/09/2021 1328   ALBUMIN 4.2 02/04/2020 1036   AST 18 01/09/2021 1328   ALT 15 01/09/2021 1328   ALKPHOS 59 01/09/2021 1328   BILITOT 0.7 01/09/2021 1328   GFRNONAA >60 01/09/2021 1328   GFRAA 104 12/20/2019 0829    No results found for: SPEP, UPEP  Lab Results  Component Value Date   WBC 5.4 01/09/2021   NEUTROABS 3.6 01/09/2021   HGB 13.5 01/09/2021   HCT 42.1 01/09/2021   MCV 88.3 01/09/2021   PLT 271 01/09/2021      Chemistry      Component Value Date/Time   NA 141 01/09/2021 1328   NA 143 12/20/2019 0829   K 4.1 01/09/2021 1328   CL 103 01/09/2021 1328   CO2 28 01/09/2021 1328   BUN 9 01/09/2021 1328   BUN 16 12/20/2019 0829   CREATININE 0.67 01/09/2021 1328      Component Value Date/Time   CALCIUM 9.2 01/09/2021 1328   ALKPHOS 59  01/09/2021 1328   AST 18 01/09/2021 1328   ALT 15 01/09/2021 1328   BILITOT 0.7 01/09/2021 1328       RADIOGRAPHIC STUDIES: I have personally reviewed the radiological images as listed and agreed with the findings in the report. CT CHEST WO CONTRAST  Result Date: 12/28/2020 CLINICAL DATA:  Acute abdominal pain.  Shortness of breath. EXAM: CT CHEST, ABDOMEN, AND PELVIS WITH CONTRAST TECHNIQUE: Multidetector CT imaging of the chest, abdomen and pelvis was performed following the standard protocol during bolus administration of intravenous contrast. CONTRAST:  5m OMNIPAQUE IOHEXOL 300 MG/ML  SOLN COMPARISON:  Chest CT dated 04/27/2020. CT abdomen dated 05/21/2020. FINDINGS: CT CHEST FINDINGS Cardiovascular: No thoracic aortic aneurysm. No pericardial effusion. Scattered coronary artery calcifications. Mild aortic atherosclerosis. 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 Mediastinum/Nodes: No mass or enlarged lymph nodes within the mediastinum or perihilar regions. Esophagus appears normal. Trachea and central bronchi are unremarkable. Lungs/Pleura: Small chronic pleural effusions with associated atelectasis. Lungs otherwise clear. No pneumothorax. Musculoskeletal: Mild degenerative spondylosis of the kyphotic thoracic spine. No acute-appearing osseous abnormality. CT ABDOMEN PELVIS FINDINGS Hepatobiliary: No focal liver abnormality is seen. Gallbladder is distended but otherwise unremarkable. No bile duct dilatation is seen. Pancreas: Unremarkable. No pancreatic ductal dilatation or surrounding inflammatory changes. Spleen: Normal in size without focal abnormality. Adrenals/Urinary Tract: Adrenal glands appear normal. Kidneys are unremarkable without suspicious mass, stone or hydronephrosis. No obstructing ureteral stone. Bladder is unremarkable, partially decompressed. Stomach/Bowel: No dilated large or small bowel loops. Extensive diverticulosis of  the transverse, descending and sigmoid colon but no focal inflammatory change to suggest acute diverticulitis. Mildly distended small bowel loops throughout the abdomen and pelvis, with fluid and associated air-fluid  levels throughout. Stomach is unremarkable, partially decompressed. Vascular/Lymphatic: No acute-appearing vascular abnormality. Mild aortic atherosclerosis. No enlarged lymph nodes are seen in the abdomen or pelvis. Reproductive: Uterus and bilateral adnexa are unremarkable. Other: Fluid stranding and nodular thickening of the anterior mesentery and central mesentery, most suggestive of omental caking suggesting neoplastic process (peritoneal carcinomatosis. Small amount of free fluid adjacent to the liver and spleen. No circumscribed fluid collection or abscess-like collection is identified. Musculoskeletal: Degenerative spondylosis of the lumbar spine, moderate in degree. No acute or suspicious osseous abnormality. (Lucent lesion within the medial aspect of the LEFT iliac bone, of uncertain significance, possibly a small benign bone island or degenerative subchondral cyst. No additional similar-appearing lesions within the axial skeleton or osseous pelvis. IMPRESSION: 1. 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 Differential for peritoneal carcinomatosis is primarily neoplastic and includes cancers of the ovary, appendix, colon, pancreas and stomach. PET-CT may be helpful for further characterization. Tissue sampling of the mesentery may be eventually required for diagnosis. 2. Mildly distended small bowel loops throughout the abdomen and pelvis, with fluid and associated air-fluid levels throughout the nondistended small bowel, suggesting a mild ileus versus partial small bowel obstruction. Favor partial small bowel obstruction with transition  zone in the RIGHT lower quadrant, likely related to aforementioned neoplastic peritoneal implants and/or adhesions. 3. Small free fluid within the RIGHT upper quadrant and LEFT upper quadrant. No abscess collection seen. No free intraperitoneal air seen. 4. Extensive colonic diverticulosis without evidence of acute diverticulitis. 5. Small chronic pleural effusions with associated atelectasis. Aortic Atherosclerosis (ICD10-I70.0). These results were called by telephone at the time of interpretation on 12/28/2020 at 12:27 pm to provider [facility] , who verbally acknowledged these results. Electronically Signed   By: Franki Cabot M.D.   On: 12/28/2020 12:28   CT ABDOMEN PELVIS W CONTRAST  Result Date: 12/28/2020 CLINICAL DATA:  Acute abdominal pain.  Shortness of breath. EXAM: CT CHEST, ABDOMEN, AND PELVIS WITH CONTRAST TECHNIQUE: Multidetector CT imaging of the chest, abdomen and pelvis was performed following the standard protocol during bolus administration of intravenous contrast. CONTRAST:  45m OMNIPAQUE IOHEXOL 300 MG/ML  SOLN COMPARISON:  Chest CT dated 04/27/2020. CT abdomen dated 05/21/2020. FINDINGS: CT CHEST FINDINGS Cardiovascular: No thoracic aortic aneurysm. No pericardial effusion. Scattered coronary artery calcifications. Mild aortic atherosclerosis. 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 Mediastinum/Nodes: No mass or enlarged lymph nodes within the mediastinum or perihilar regions. Esophagus appears normal. Trachea and central bronchi are unremarkable. Lungs/Pleura: Small chronic pleural effusions with associated atelectasis. Lungs otherwise clear. No pneumothorax. Musculoskeletal: Mild degenerative spondylosis of the kyphotic thoracic spine. No acute-appearing osseous abnormality. CT ABDOMEN PELVIS FINDINGS Hepatobiliary: No focal liver abnormality is seen. Gallbladder is distended but otherwise unremarkable. No bile duct dilatation is  seen. Pancreas: Unremarkable. No pancreatic ductal dilatation or surrounding inflammatory changes. Spleen: Normal in size without focal abnormality. Adrenals/Urinary Tract: Adrenal glands appear normal. Kidneys are unremarkable without suspicious mass, stone or hydronephrosis. No obstructing ureteral stone. Bladder is unremarkable, partially decompressed. Stomach/Bowel: No dilated large or small bowel loops. Extensive diverticulosis of the transverse, descending and sigmoid colon but no focal inflammatory change to suggest acute diverticulitis. Mildly distended small bowel loops throughout the abdomen and pelvis, with fluid and associated air-fluid levels throughout. Stomach is unremarkable, partially decompressed. Vascular/Lymphatic: No acute-appearing vascular abnormality. Mild aortic atherosclerosis. No enlarged lymph nodes  are seen in the abdomen or pelvis. Reproductive: Uterus and bilateral adnexa are unremarkable. Other: Fluid stranding and nodular thickening of the anterior mesentery and central mesentery, most suggestive of omental caking suggesting neoplastic process (peritoneal carcinomatosis. Small amount of free fluid adjacent to the liver and spleen. No circumscribed fluid collection or abscess-like collection is identified. Musculoskeletal: Degenerative spondylosis of the lumbar spine, moderate in degree. No acute or suspicious osseous abnormality. (Lucent lesion within the medial aspect of the LEFT iliac bone, of uncertain significance, possibly a small benign bone island or degenerative subchondral cyst. No additional similar-appearing lesions within the axial skeleton or osseous pelvis. IMPRESSION: 1. 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 Differential for peritoneal carcinomatosis is primarily neoplastic and includes cancers of the ovary,  appendix, colon, pancreas and stomach. PET-CT may be helpful for further characterization. Tissue sampling of the mesentery may be eventually required for diagnosis. 2. Mildly distended small bowel loops throughout the abdomen and pelvis, with fluid and associated air-fluid levels throughout the nondistended small bowel, suggesting a mild ileus versus partial small bowel obstruction. Favor partial small bowel obstruction with transition zone in the RIGHT lower quadrant, likely related to aforementioned neoplastic peritoneal implants and/or adhesions. 3. Small free fluid within the RIGHT upper quadrant and LEFT upper quadrant. No abscess collection seen. No free intraperitoneal air seen. 4. Extensive colonic diverticulosis without evidence of acute diverticulitis. 5. Small chronic pleural effusions with associated atelectasis. Aortic Atherosclerosis (ICD10-I70.0). These results were called by telephone at the time of interpretation on 12/28/2020 at 12:27 pm to provider [facility] , who verbally acknowledged these results. Electronically Signed   By: Franki Cabot M.D.   On: 12/28/2020 12:28   DG Chest Portable 1 View  Result Date: 12/28/2020 CLINICAL DATA:  Severe abdominal pain. Clinical concern for free peritoneal air. EXAM: PORTABLE CHEST 1 VIEW COMPARISON:  05/21/2020 FINDINGS: Normal sized heart. Clear lungs. No free peritoneal air seen. Thoracic spine degenerative changes. IMPRESSION: No free peritoneal air or other acute abnormality. Electronically Signed   By: Claudie Revering M.D.   On: 12/28/2020 11:58   DG C-Arm 1-60 Min-No Report  Result Date: 01/09/2021 Fluoroscopy was utilized by the requesting physician.  No radiographic interpretation.   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 EXAM: TRANSABDOMINAL AND TRANSVAGINAL ULTRASOUND OF PELVIS TECHNIQUE: Both transabdominal and transvaginal ultrasound examinations  of the pelvis were performed. Transabdominal technique was performed for global imaging of the pelvis including uterus, ovaries, adnexal regions, and pelvic cul-de-sac. It was necessary to proceed with endovaginal exam following the transabdominal exam to visualize the uterus, endometrium, and ovaries. COMPARISON:  Prior CT from 12/28/2020. FINDINGS: Uterus Measurements: 4.8 x 2.9 x 3.9 cm = volume: 29.2 mL. Uterus is retroverted. Heterogeneous echotexture seen within the uterine myometrium without discrete fibroid or other mass. Endometrium Thickness: 5.0 mm.  No focal abnormality visualized. Right ovary Measurements: 1.6 x 1.7 x 1.0 cm = volume: 0.9 mL. Normal appearance/no adnexal mass. Left ovary Measurements: 1.7 x 0.8 x 1.6 cm = volume: 1.2 mL. Normal appearance/no adnexal mass. Other findings No abnormal free fluid. IMPRESSION: Negative pelvic ultrasound with normal sonographic appearance of the uterus, endometrium, and ovaries. No adnexal or ovarian mass identified to explain carcinomatosis. Electronically Signed   By: Jeannine Boga M.D.   On: 01/01/2021 19:39

## 2021-01-10 NOTE — Assessment & Plan Note (Signed)
I recommend referral to cancer rehab

## 2021-01-10 NOTE — Assessment & Plan Note (Signed)
We had extensive discussions about dietary modifications, regular laxatives and adequate hydrations

## 2021-01-12 ENCOUNTER — Other Ambulatory Visit: Payer: Self-pay

## 2021-01-12 ENCOUNTER — Other Ambulatory Visit: Payer: Self-pay | Admitting: Oncology

## 2021-01-12 ENCOUNTER — Inpatient Hospital Stay: Payer: Medicare Other

## 2021-01-12 ENCOUNTER — Encounter: Payer: Self-pay | Admitting: Oncology

## 2021-01-12 ENCOUNTER — Telehealth: Payer: Self-pay | Admitting: Oncology

## 2021-01-12 ENCOUNTER — Encounter (HOSPITAL_COMMUNITY): Payer: Self-pay | Admitting: General Surgery

## 2021-01-12 VITALS — BP 161/81 | HR 65 | Temp 98.5°F | Resp 16 | Wt 249.0 lb

## 2021-01-12 DIAGNOSIS — C482 Malignant neoplasm of peritoneum, unspecified: Secondary | ICD-10-CM | POA: Diagnosis not present

## 2021-01-12 DIAGNOSIS — C786 Secondary malignant neoplasm of retroperitoneum and peritoneum: Secondary | ICD-10-CM

## 2021-01-12 DIAGNOSIS — C762 Malignant neoplasm of abdomen: Secondary | ICD-10-CM

## 2021-01-12 MED ORDER — SODIUM CHLORIDE 0.9 % IV SOLN
750.0000 mg | Freq: Once | INTRAVENOUS | Status: AC
  Administered 2021-01-12: 750 mg via INTRAVENOUS
  Filled 2021-01-12: qty 75

## 2021-01-12 MED ORDER — FAMOTIDINE 20 MG IN NS 100 ML IVPB
INTRAVENOUS | Status: AC
  Filled 2021-01-12: qty 100

## 2021-01-12 MED ORDER — PALONOSETRON HCL INJECTION 0.25 MG/5ML
INTRAVENOUS | Status: AC
  Filled 2021-01-12: qty 5

## 2021-01-12 MED ORDER — SODIUM CHLORIDE 0.9 % IV SOLN
10.0000 mg | Freq: Once | INTRAVENOUS | Status: AC
  Administered 2021-01-12: 10 mg via INTRAVENOUS
  Filled 2021-01-12: qty 10

## 2021-01-12 MED ORDER — SODIUM CHLORIDE 0.9 % IV SOLN
Freq: Once | INTRAVENOUS | Status: AC
  Filled 2021-01-12: qty 250

## 2021-01-12 MED ORDER — HEPARIN SOD (PORK) LOCK FLUSH 100 UNIT/ML IV SOLN
500.0000 [IU] | Freq: Once | INTRAVENOUS | Status: AC | PRN
  Administered 2021-01-12: 500 [IU]
  Filled 2021-01-12: qty 5

## 2021-01-12 MED ORDER — SODIUM CHLORIDE 0.9% FLUSH
10.0000 mL | INTRAVENOUS | Status: DC | PRN
  Administered 2021-01-12: 10 mL
  Filled 2021-01-12: qty 10

## 2021-01-12 MED ORDER — DIPHENHYDRAMINE HCL 50 MG/ML IJ SOLN
50.0000 mg | Freq: Once | INTRAMUSCULAR | Status: AC
  Administered 2021-01-12: 50 mg via INTRAVENOUS

## 2021-01-12 MED ORDER — PALONOSETRON HCL INJECTION 0.25 MG/5ML
0.2500 mg | Freq: Once | INTRAVENOUS | Status: AC
  Administered 2021-01-12: 0.25 mg via INTRAVENOUS

## 2021-01-12 MED ORDER — SODIUM CHLORIDE 0.9 % IV SOLN
175.0000 mg/m2 | Freq: Once | INTRAVENOUS | Status: AC
  Administered 2021-01-12: 336 mg via INTRAVENOUS
  Filled 2021-01-12: qty 56

## 2021-01-12 MED ORDER — FAMOTIDINE 20 MG IN NS 100 ML IVPB
20.0000 mg | Freq: Once | INTRAVENOUS | Status: AC
  Administered 2021-01-12: 20 mg via INTRAVENOUS

## 2021-01-12 MED ORDER — SODIUM CHLORIDE 0.9 % IV SOLN
150.0000 mg | Freq: Once | INTRAVENOUS | Status: AC
  Administered 2021-01-12: 150 mg via INTRAVENOUS
  Filled 2021-01-12: qty 150

## 2021-01-12 MED ORDER — DIPHENHYDRAMINE HCL 50 MG/ML IJ SOLN
INTRAMUSCULAR | Status: AC
  Filled 2021-01-12: qty 1

## 2021-01-12 NOTE — Progress Notes (Signed)
Gynecologic Oncology Multi-Disciplinary Disposition Conference Note  Date of the Conference: 01/12/2021  Patient Name: Brittany Middleton  Primary GYN Oncologist: Dr. 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 Disposition is to 3 cycles of chemotherapy followed by imaging and consideration for surgery.   This Multidisciplinary conference took place involving physicians from [place], [place], Radiation Oncology, Pathology, Radiology along with the Gynecologic Oncology Nurse Practitioner and RN.  Comprehensive assessment of the patient's malignancy, staging, need for surgery, chemotherapy, radiation therapy, and need for further testing were reviewed. Supportive measures, both inpatient and following discharge were also discussed. The recommended plan of care is documented. Greater than 35 minutes were spent correlating and coordinating this patient's care.

## 2021-01-12 NOTE — Progress Notes (Signed)
Met with Brittany Middleton during her first chemotherapy treatment.  Provided her with the number to schedule her MRI at [facility].  She will call and schedule it today.

## 2021-01-12 NOTE — Progress Notes (Signed)
Confirmed with Dr [name] -   Taxol dose use adjusted body weight for dose calculation Carobplatin use actual body weight for dose calculation  T.O. Dr Lesli Albee, PharmD

## 2021-01-12 NOTE — Patient Instructions (Signed)
[facility]  Discharge Instructions: Thank you for choosing [facility] to provide your oncology and hematology care.   If you have a lab appointment with the [facility], please go directly to the [facility] and check in at the registration area.   Wear comfortable clothing and clothing appropriate for easy access to any Portacath or PICC line.   We strive to give you quality time with your provider. You may need to reschedule your appointment if you arrive late (15 or more minutes).  Arriving late affects you and other patients whose appointments are after yours.  Also, if you miss three or more appointments without notifying the office, you may be dismissed from the clinic at the provider's discretion.      For prescription refill requests, have your pharmacy contact our office and allow 72 hours for refills to be completed.    Today you received the following chemotherapy and/or immunotherapy agents Paclitaxel (Taxol) and Carboplatin (Paraplatin).      To help prevent nausea and vomiting after your treatment, we encourage you to take your nausea medication as directed.  BELOW ARE SYMPTOMS THAT SHOULD BE REPORTED IMMEDIATELY: *FEVER GREATER THAN 100.4 F (38 C) OR HIGHER *CHILLS OR SWEATING *NAUSEA AND VOMITING THAT IS NOT CONTROLLED WITH YOUR NAUSEA MEDICATION *UNUSUAL SHORTNESS OF BREATH *UNUSUAL BRUISING OR BLEEDING *URINARY PROBLEMS (pain or burning when urinating, or frequent urination) *BOWEL PROBLEMS (unusual diarrhea, constipation, pain near the anus) TENDERNESS IN MOUTH AND THROAT WITH OR WITHOUT PRESENCE OF ULCERS (sore throat, sores in mouth, or a toothache) UNUSUAL RASH, SWELLING OR PAIN  UNUSUAL VAGINAL DISCHARGE OR ITCHING   Items with * indicate a potential emergency and should be followed up as soon as possible or go to the Emergency Department if any problems should occur.  Please show the CHEMOTHERAPY ALERT CARD or  IMMUNOTHERAPY ALERT CARD at check-in to the Emergency Department and triage nurse.  Should you have questions after your visit or need to cancel or reschedule your appointment, please contact [facility]  Dept: [phone]  and follow the prompts.  Office hours are 8:00 a.m. to 4:30 p.m. Monday - Friday. Please note that voicemails left after 4:00 p.m. may not be returned until the following business day.  We are closed weekends and major holidays. You have access to a nurse at all times for urgent questions. Please call the main number to the clinic Dept: [phone] and follow the prompts.   For any non-urgent questions, you may also contact your provider using MyChart. We now offer e-Visits for anyone 4 and older to request care online for non-urgent symptoms. For details visit [email or web address].   Also download the MyChart app! Go to the app store, search "MyChart", open the app, select [facility], and log in with your MyChart username and password.  Due to Covid, a mask is required upon entering the hospital/clinic. If you do not have a mask, one will be given to you upon arrival. For doctor visits, patients may have 1 support person aged 50 or older with them. For treatment visits, patients cannot have anyone with them due to current Covid guidelines and our immunocompromised population.   Paclitaxel injection What is this medication? PACLITAXEL (PAK li TAX el) is a chemotherapy drug. It targets fast dividing cells, like cancer cells, and causes these cells to die. This medicine is used to treat ovarian cancer, breast cancer, lung cancer, Kaposi's sarcoma, andother cancers.  This medicine may be used for other purposes; ask your health care provider orpharmacist if you have questions. COMMON BRAND NAME(S): Onxol, Taxol What should I tell my care team before I take this medication? They need to know if you have any of these conditions: history of  irregular heartbeat liver disease low blood counts, like low white cell, platelet, or red cell counts lung or breathing disease, like asthma tingling of the fingers or toes, or other nerve disorder an unusual or allergic reaction to paclitaxel, alcohol, polyoxyethylated castor oil, other chemotherapy, other medicines, foods, dyes, or preservatives pregnant or trying to get pregnant breast-feeding How should I use this medication? This drug is given as an infusion into a vein. It is administered in a hospitalor clinic by a specially trained health care professional. Talk to your pediatrician regarding the use of this medicine in children.Special care may be needed. Overdosage: If you think you have taken too much of this medicine contact apoison control center or emergency room at once. NOTE: This medicine is only for you. Do not share this medicine with others. What if I miss a dose? It is important not to miss your dose. Call your doctor or health careprofessional if you are unable to keep an appointment. What may interact with this medication? Do not take this medicine with any of the following medications: live virus vaccines This medicine may also interact with the following medications: antiviral medicines for hepatitis, HIV or AIDS certain antibiotics like erythromycin and clarithromycin certain medicines for fungal infections like ketoconazole and itraconazole certain medicines for seizures like carbamazepine, phenobarbital, phenytoin gemfibrozil nefazodone rifampin St. John's wort This list may not describe all possible interactions. Give your health care provider a list of all the medicines, herbs, non-prescription drugs, or dietary supplements you use. Also tell them if you smoke, drink alcohol, or use illegaldrugs. Some items may interact with your medicine. What should I watch for while using this medication? Your condition will be monitored carefully while you are receiving  this medicine. You will need important blood work done while you are taking thismedicine. This medicine can cause serious allergic reactions. To reduce your risk you will need to take other medicine(s) before treatment with this medicine. If you experience allergic reactions like skin rash, itching or hives, swelling of theface, lips, or tongue, tell your doctor or health care professional right away. In some cases, you may be given additional medicines to help with side effects.Follow all directions for their use. This drug may make you feel generally unwell. This is not uncommon, as chemotherapy can affect healthy cells as well as cancer cells. Report any side effects. Continue your course of treatment even though you feel ill unless yourdoctor tells you to stop. Call your doctor or health care professional for advice if you get a fever, chills or sore throat, or other symptoms of a cold or flu. Do not treat yourself. This drug decreases your body's ability to fight infections. Try toavoid being around people who are sick. This medicine may increase your risk to bruise or bleed. Call your doctor orhealth care professional if you notice any unusual bleeding. Be careful brushing and flossing your teeth or using a toothpick because you may get an infection or bleed more easily. If you have any dental work done,tell your dentist you are receiving this medicine. Avoid taking products that contain aspirin, acetaminophen, ibuprofen, naproxen, or ketoprofen unless instructed by your doctor. These medicines may hide afever. Do not become pregnant  while taking this medicine. Women should inform their doctor if they wish to become pregnant or think they might be pregnant. There is a potential for serious side effects to an unborn child. Talk to your health care professional or pharmacist for more information. Do not breast-feed aninfant while taking this medicine. Men are advised not to father a child while  receiving this medicine. This product may contain alcohol. Ask your pharmacist or healthcare provider if this medicine contains alcohol. Be sure to tell all healthcare providers you are taking this medicine. Certain medicines, like metronidazole and disulfiram, can cause an unpleasant reaction when taken with alcohol. The reaction includes flushing, headache, nausea, vomiting, sweating, and increased thirst. Thereaction can last from 30 minutes to several hours. What side effects may I notice from receiving this medication? Side effects that you should report to your doctor or health care professionalas soon as possible: allergic reactions like skin rash, itching or hives, swelling of the face, lips, or tongue breathing problems changes in vision fast, irregular heartbeat high or low blood pressure mouth sores pain, tingling, numbness in the hands or feet signs of decreased platelets or bleeding - bruising, pinpoint red spots on the skin, black, tarry stools, blood in the urine signs of decreased red blood cells - unusually weak or tired, feeling faint or lightheaded, falls signs of infection - fever or chills, cough, sore throat, pain or difficulty passing urine signs and symptoms of liver injury like dark yellow or brown urine; general ill feeling or flu-like symptoms; light-colored stools; loss of appetite; nausea; right upper belly pain; unusually weak or tired; yellowing of the eyes or skin swelling of the ankles, feet, hands unusually slow heartbeat Side effects that usually do not require medical attention (report to yourdoctor or health care professional if they continue or are bothersome): diarrhea hair loss loss of appetite muscle or joint pain nausea, vomiting pain, redness, or irritation at site where injected tiredness This list may not describe all possible side effects. Call your doctor for medical advice about side effects. You may report side effects to FDA  at1-800-FDA-1088. Where should I keep my medication? This drug is given in a hospital or clinic and will not be stored at home. NOTE: This sheet is a summary. It may not cover all possible information. If you have questions about this medicine, talk to your doctor, pharmacist, orhealth care provider.  2022 Elsevier/Gold Standard (2019-05-09 13:37:23)  Carboplatin injection What is this medication? CARBOPLATIN (KAR boe pla tin) is a chemotherapy drug. It targets fast dividing cells, like cancer cells, and causes these cells to die. This medicine is usedto treat ovarian cancer and many other cancers. This medicine may be used for other purposes; ask your health care provider orpharmacist if you have questions. COMMON BRAND NAME(S): Paraplatin What should I tell my care team before I take this medication? They need to know if you have any of these conditions: blood disorders hearing problems kidney disease recent or ongoing radiation therapy an unusual or allergic reaction to carboplatin, cisplatin, other chemotherapy, other medicines, foods, dyes, or preservatives pregnant or trying to get pregnant breast-feeding How should I use this medication? This drug is usually given as an infusion into a vein. It is administered in [place] or clinic by a specially trained health care professional. Talk to your pediatrician regarding the use of this medicine in children.Special care may be needed. Overdosage: If you think you have taken too much of this medicine contact apoison control center  or emergency room at once. NOTE: This medicine is only for you. Do not share this medicine with others. What if I miss a dose? It is important not to miss a dose. Call your doctor or health careprofessional if you are unable to keep an appointment. What may interact with this medication? medicines for seizures medicines to increase blood counts like filgrastim, pegfilgrastim, sargramostim some antibiotics  like amikacin, gentamicin, neomycin, streptomycin, tobramycin vaccines Talk to your doctor or health care professional before taking any of thesemedicines: acetaminophen aspirin ibuprofen ketoprofen naproxen This list may not describe all possible interactions. Give your health care provider a list of all the medicines, herbs, non-prescription drugs, or dietary supplements you use. Also tell them if you smoke, drink alcohol, or use illegaldrugs. Some items may interact with your medicine. What should I watch for while using this medication? Your condition will be monitored carefully while you are receiving this medicine. You will need important blood work done while you are taking thismedicine. This drug may make you feel generally unwell. This is not uncommon, as chemotherapy can affect healthy cells as well as cancer cells. Report any side effects. Continue your course of treatment even though you feel ill unless yourdoctor tells you to stop. In some cases, you may be given additional medicines to help with side effects.Follow all directions for their use. Call your doctor or health care professional for advice if you get a fever, chills or sore throat, or other symptoms of a cold or flu. Do not treat yourself. This drug decreases your body's ability to fight infections. Try toavoid being around people who are sick. This medicine may increase your risk to bruise or bleed. Call your doctor orhealth care professional if you notice any unusual bleeding. Be careful brushing and flossing your teeth or using a toothpick because you may get an infection or bleed more easily. If you have any dental work done,tell your dentist you are receiving this medicine. Avoid taking products that contain aspirin, acetaminophen, ibuprofen, naproxen, or ketoprofen unless instructed by your doctor. These medicines may hide afever. Do not become pregnant while taking this medicine. Women should inform their doctor if  they wish to become pregnant or think they might be pregnant. There is a potential for serious side effects to an unborn child. Talk to your health care professional or pharmacist for more information. Do not breast-feed aninfant while taking this medicine. What side effects may I notice from receiving this medication? Side effects that you should report to your doctor or health care professionalas soon as possible: allergic reactions like skin rash, itching or hives, swelling of the face, lips, or tongue signs of infection - fever or chills, cough, sore throat, pain or difficulty passing urine signs of decreased platelets or bleeding - bruising, pinpoint red spots on the skin, black, tarry stools, nosebleeds signs of decreased red blood cells - unusually weak or tired, fainting spells, lightheadedness breathing problems changes in hearing changes in vision chest pain high blood pressure low blood counts - This drug may decrease the number of white blood cells, red blood cells and platelets. You may be at increased risk for infections and bleeding. nausea and vomiting pain, swelling, redness or irritation at the injection site pain, tingling, numbness in the hands or feet problems with balance, talking, walking trouble passing urine or change in the amount of urine Side effects that usually do not require medical attention (report to yourdoctor or health care professional if they continue or  are bothersome): hair loss loss of appetite metallic taste in the mouth or changes in taste This list may not describe all possible side effects. Call your doctor for medical advice about side effects. You may report side effects to FDA at1-800-FDA-1088. Where should I keep my medication? This drug is given in a hospital or clinic and will not be stored at home. NOTE: This sheet is a summary. It may not cover all possible information. If you have questions about this medicine, talk to your doctor,  pharmacist, orhealth care provider.  2022 Elsevier/Gold Standard (2007-09-12 14:38:05)

## 2021-01-12 NOTE — Telephone Encounter (Signed)
Left a message for Addison Lank, NP at [facility] regarding HRD testing.  Requested a return call.

## 2021-01-13 ENCOUNTER — Telehealth: Payer: Self-pay | Admitting: Oncology

## 2021-01-13 NOTE — Telephone Encounter (Signed)
Called [place], NP with Dr. Blake Divine office regarding genetic testing.  Discussed that we have had billing issues with sending genetic testing for germline and tumor separately.    She said they are requesting testing because Juliann Pulse may be eligible for a clinical trial after surgery if she has negative genetic testing.  The study is Flora5 [id number] evaluating oregovomab vs placebo plus carboplatin.    If she is interested, Juliann Pulse would need HRD testing on her biopsy and also germline testing now so that results are back in time to start the study after surgery.

## 2021-01-13 NOTE — Telephone Encounter (Signed)
Brittany Middleton with appointments for genetic counseling and to follow up with Dr. [name] on 01/26/21.

## 2021-01-14 ENCOUNTER — Telehealth: Payer: Self-pay

## 2021-01-14 NOTE — Telephone Encounter (Signed)
Patient called with concerns related to abdominal discomfort.   History of peritoneal carcinomatosis.  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   Patient reporting sharp pain to lower abdominal area "above pubic area."  Pain is sharp, and comes and goes.  Pain started 7/26 and went through evening into this AM.  Patient reports flatulence, and BM X 2.  Pressure to lower lumbar area.  Denies any fever, or nausea.   Tolerating fluids and food intake well.  Patient currently using Tylenol and Bentyl.   RN encouraged patient to continue to monitor for fever, continue with medications as directed, and encouraged movement as tolerated.    Will forward to MD for any additional recommendations.

## 2021-01-14 NOTE — Telephone Encounter (Signed)
Patient did see MD @ [facility] this AM.    Per patient MD relating pain to gas/digestive system working.  Patient reported that MD had no concerns for bowel obstruction due to being able to pass gas and 2 BM's.    RN educated patient that oncologist will be made aware.  Encouraged patient to continue with medication regiment, fluids, and movement.

## 2021-01-14 NOTE — Telephone Encounter (Signed)
Looks like she is seeing an MD today at [facility] surgery? If so, he can assess her today and let us know what he finds

## 2021-01-14 NOTE — Telephone Encounter (Signed)
OK, I will get Hassan Rowan to call and check on her tomorrow

## 2021-01-15 ENCOUNTER — Encounter: Payer: Self-pay | Admitting: Hematology and Oncology

## 2021-01-15 ENCOUNTER — Telehealth: Payer: Self-pay

## 2021-01-15 ENCOUNTER — Telehealth: Payer: Self-pay | Admitting: Oncology

## 2021-01-15 NOTE — Telephone Encounter (Signed)
Called Brittany Middleton and rescheduled her apt with Dr. [name] to 01/19/21 at 11:00 since her MRI was moved up to 01/17/21.

## 2021-01-15 NOTE — Telephone Encounter (Signed)
OK, Will check on her again tomorrow

## 2021-01-15 NOTE — Telephone Encounter (Signed)
Brittany Middleton called back and has another apt on Monday.  Rescheduled her apt with Dr. [name] to 01/20/21.

## 2021-01-15 NOTE — Telephone Encounter (Signed)
Called back and given below message. Last bm 7/26. She is taking Miralax BID. Instructed to add Senokot 2 tabs TID today. She is passing gas and c/c of some lower shooting/ intestinal pain at times. She is able to eat and drink with no problems. C/o of some right rib tenderness. She was instructed by PT to massage the area of tenderness. She is starting to having leg discomfort and muscle tightening. She is stretching and was able to do gardening this morning. She has Tramadol from surgeon. She took tramadol last night and this morning. It seems to be helping. Instructed to take tylenol every 6 hours as needed for leg discomfort. She verbalized understanding.

## 2021-01-15 NOTE — Telephone Encounter (Signed)
-----   Message from [name], MD sent at 01/15/2021  8:14 AM EDT ----- Can you call and check on how she is doing today>

## 2021-01-15 NOTE — Telephone Encounter (Signed)
Called and left a message asking her to call the office back. 

## 2021-01-15 NOTE — Telephone Encounter (Signed)
Called and given below message. She verbalized understanding and will call the office if needed.

## 2021-01-16 ENCOUNTER — Telehealth: Payer: Self-pay

## 2021-01-16 NOTE — Telephone Encounter (Signed)
-----   Message from [name], MD sent at 01/16/2021  8:30 AM EDT ----- Pls call her again and check on her, see if she needs to be seen or ok

## 2021-01-16 NOTE — Telephone Encounter (Signed)
Called back to see how she is doing today. She was sleeping late and feeling much better today. She had x 2 bm's today and will continue laxatives. Eating and drinking with no problems. Abdominal discomfort and leg discomfort much better. Ask her to call the office back for questions or concerns. She verbalized understanding.

## 2021-01-16 NOTE — Telephone Encounter (Signed)
Called and left a message asking her to call the office back. 

## 2021-01-17 ENCOUNTER — Other Ambulatory Visit: Payer: Self-pay

## 2021-01-17 ENCOUNTER — Ambulatory Visit (HOSPITAL_BASED_OUTPATIENT_CLINIC_OR_DEPARTMENT_OTHER)
Admission: RE | Admit: 2021-01-17 | Discharge: 2021-01-17 | Disposition: A | Discharge status: Home / Self Care | Payer: Medicare Other | Source: Ambulatory Visit | Attending: Hematology and Oncology | Admitting: Hematology and Oncology

## 2021-01-17 DIAGNOSIS — C762 Malignant neoplasm of abdomen: Secondary | ICD-10-CM

## 2021-01-17 DIAGNOSIS — C786 Secondary malignant neoplasm of retroperitoneum and peritoneum: Secondary | ICD-10-CM | POA: Insufficient documentation

## 2021-01-17 DIAGNOSIS — H532 Diplopia: Secondary | ICD-10-CM | POA: Diagnosis present

## 2021-01-17 MED ORDER — GADOBUTROL 1 MMOL/ML IV SOLN
10.0000 mL | Freq: Once | INTRAVENOUS | Status: AC | PRN
  Administered 2021-01-17: 10 mL via INTRAVENOUS

## 2021-01-19 ENCOUNTER — Ambulatory Visit: Payer: Medicare Other | Admitting: Hematology and Oncology

## 2021-01-20 ENCOUNTER — Encounter: Payer: Self-pay | Admitting: Hematology and Oncology

## 2021-01-20 ENCOUNTER — Inpatient Hospital Stay: Payer: Medicare Other | Attending: Hematology and Oncology | Admitting: Hematology and Oncology

## 2021-01-20 ENCOUNTER — Other Ambulatory Visit: Payer: Self-pay

## 2021-01-20 DIAGNOSIS — E669 Obesity, unspecified: Secondary | ICD-10-CM | POA: Insufficient documentation

## 2021-01-20 DIAGNOSIS — C482 Malignant neoplasm of peritoneum, unspecified: Secondary | ICD-10-CM | POA: Insufficient documentation

## 2021-01-20 DIAGNOSIS — E66813 Obesity, class 3: Secondary | ICD-10-CM

## 2021-01-20 DIAGNOSIS — T80212A Local infection due to central venous catheter, initial encounter: Secondary | ICD-10-CM | POA: Diagnosis not present

## 2021-01-20 DIAGNOSIS — I700 Atherosclerosis of aorta: Secondary | ICD-10-CM | POA: Insufficient documentation

## 2021-01-20 DIAGNOSIS — C762 Malignant neoplasm of abdomen: Secondary | ICD-10-CM

## 2021-01-20 DIAGNOSIS — B9620 Unspecified Escherichia coli [E. coli] as the cause of diseases classified elsewhere: Secondary | ICD-10-CM | POA: Diagnosis not present

## 2021-01-20 DIAGNOSIS — C786 Secondary malignant neoplasm of retroperitoneum and peritoneum: Secondary | ICD-10-CM | POA: Diagnosis not present

## 2021-01-20 DIAGNOSIS — N390 Urinary tract infection, site not specified: Secondary | ICD-10-CM | POA: Diagnosis not present

## 2021-01-20 DIAGNOSIS — Z9221 Personal history of antineoplastic chemotherapy: Secondary | ICD-10-CM | POA: Insufficient documentation

## 2021-01-20 DIAGNOSIS — K5730 Diverticulosis of large intestine without perforation or abscess without bleeding: Secondary | ICD-10-CM | POA: Insufficient documentation

## 2021-01-20 DIAGNOSIS — Z5111 Encounter for antineoplastic chemotherapy: Secondary | ICD-10-CM | POA: Diagnosis not present

## 2021-01-20 DIAGNOSIS — Z79899 Other long term (current) drug therapy: Secondary | ICD-10-CM | POA: Insufficient documentation

## 2021-01-20 DIAGNOSIS — R5381 Other malaise: Secondary | ICD-10-CM | POA: Diagnosis not present

## 2021-01-20 MED ORDER — TRAMADOL HCL 50 MG PO TABS: 100.0000 mg | ORAL_TABLET | Freq: Four times a day (QID) | ORAL | 0 refills | Status: DC | PRN

## 2021-01-20 NOTE — Assessment & Plan Note (Signed)
I have reviewed imaging study and test result MRI is negative Overall, she is recovering well from side effects of treatment Continue supportive care

## 2021-01-20 NOTE — Assessment & Plan Note (Signed)
She is waiting for phone call from physical therapy and rehab

## 2021-01-20 NOTE — Assessment & Plan Note (Signed)
We had extensive discussions about the importance of dietary modification and weight loss

## 2021-01-20 NOTE — Progress Notes (Signed)
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   All questions were answered. The patient knows to call the clinic with any problems, questions or concerns. The total time spent in the appointment was 40 minutes encounter with patients including review of chart and various tests results, discussions about plan of care and coordination of care plan   Brittany Lark, MD 01/20/2021 12:20 PM  INTERVAL HISTORY: Please see below for problem oriented charting. She returns for further follow-up with her son Her other son is available on the phone to collaborate her history First few days, she had difficulties tolerating treatment She was profoundly constipated but then had normal bowel movement recently Her abdominal wall pain has improved She denies nausea although she did take some Compazine She has some balance issues but no falls She admits she might not be drinking enough fluids at times She is eating frequent small meals Her son and the patient have extensive questions regarding dietary changes while on treatment  SUMMARY OF ONCOLOGIC HISTORY: Oncology History  Abdominal  carcinomatosis ([place])  12/30/2020 Initial Diagnosis   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[place])    01/12/2021 -  Chemotherapy    Patient is on Treatment Plan: OVARIAN CARBOPLATIN (AUC 6) / PACLITAXEL (175) Q21D X 6 CYCLES       Peritoneal carcinomatosis ([place])  07/18/2018 Procedure   Colonoscopy - Non-bleeding internal hemorrhoids. - Diverticulosis in the sigmoid colon and in the descending colon. - No specimens collected. - Blood in stool without cause found on endoscopic exam   05/08/2020 Imaging   1. Bladder is decompressed though demonstrates significant perivesicular hazy stranding, mucosal hyperemia and edematous mural thickening. Findings are suggestive of cystitis. Correlate with urinalysis. No abnormal perinephric or periureteral stranding or other features to suggest an ascending tract infection at this time. 2. Colonic diverticulosis without evidence of acute diverticulitis. 3. Aortic Atherosclerosis (ICD10-I70.0).   12/28/2020 Imaging   1. 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 Differential for peritoneal carcinomatosis is primarily neoplastic and includes cancers of the ovary, appendix, colon, pancreas and stomach. PET-CT may be helpful for further characterization. Tissue sampling of the mesentery may be eventually required for diagnosis. 2. Mildly distended small bowel loops throughout the abdomen and pelvis, with fluid and associated air-fluid levels throughout the nondistended small bowel, suggesting a mild ileus versus partial small bowel obstruction. Favor partial small bowel obstruction with transition zone in the RIGHT lower quadrant, likely related to aforementioned neoplastic peritoneal implants and/or adhesions. 3. Small free fluid within the RIGHT upper quadrant and LEFT upper quadrant. No abscess collection seen. No free  intraperitoneal air seen. 4. Extensive colonic diverticulosis without evidence of acute diverticulitis. 5. Small  chronic pleural effusions with associated atelectasis.   12/29/2020 Tumor Marker   Patient's tumor was tested for the following markers: CA-125. Results of the tumor marker test revealed 95.1.   12/30/2020 Surgery   Preoperative diagnosis: abdominal nodules   Postoperative diagnosis: same   Procedure: diagnostic laparoscopy with abdominal wall biopsy   Surgeon: Brittany Middleton, M.D.    Indications for procedure: Brittany Middleton is a 70 y.o. year old female with symptoms of nausea, vomiting, and abdominal pain. Work up was concerning for cancer with abdominal wall studding.   Description of procedure: The patient was brought into the operative suite. Anesthesia was administered with General endotracheal anesthesia. WHO checklist was applied. The patient was then placed in supine position. The area was prepped and draped in the usual sterile fashion.   A small left subcostal incision was made. A 16m trocar was used to gain access to the peritoneal cavity by optical entry technique. Pneumoperitoneum was applied with a high flow and low pressure. The laparoscope was reinserted to confirm position.   On initial visualization of the abdomen, there were multiple nodules throughout the abdominal wall. There were multiple nodules over the small intestine and mesentery. There was some loops of small intestine that were matted together. There was a large white mass in the left upper quadrant. Multiple areas of peritoneum were removed with harmonic scalpel. The large mass was inspected. It appeared to be related to the colon and omentum. Grasper was used to remove some superficial tissue and was sent for culture.   The abdomen was desufflated. The incisions were closed with 4-0 monocryl subcuticular stitch.   12/30/2020 Pathology Results   FINAL MICROSCOPIC DIAGNOSIS:   A. ABDOMINAL  WALL, NODULE, EXCISION:  -  Metastatic adenocarcinoma  -  See comment   B. PERI COLONIC EXUDATE, EXCISION:  -  Metastatic adenocarcinoma  -  See comment   COMMENT:   By immunohistochemistry, the neoplastic cells are positive for cytokeratin 7, PAX8 and WT1 but negative for cytokeratin 20, CDX2, p53 and TTF-1.  The morphology and immunophenotype are consistent with a gynecologic primary.     01/01/2021 Initial Diagnosis   Peritoneal carcinomatosis ([place]    01/08/2021 Cancer Staging   Staging form: Ovary, Fallopian Tube, and Primary Peritoneal Carcinoma, AJCC 8th Edition - Clinical stage from 01/08/2021: cT3, cN0, cM0 - Signed by GHeath Lark MD on 01/08/2021  Stage prefix: Initial diagnosis    01/12/2021 -  Chemotherapy    Patient is on Treatment Plan: OVARIAN CARBOPLATIN (AUC 6) / PACLITAXEL (175) Q21D X 6 CYCLES       01/17/2021 Imaging   MRI brain No evidence of intracranial metastatic disease or other acute abnormality     REVIEW OF SYSTEMS:   Constitutional: Denies fevers, chills  Eyes: Denies blurriness of vision Ears, nose, mouth, throat, and face: Denies mucositis or sore throat Respiratory: Denies cough, dyspnea or wheezes Cardiovascular: Denies palpitation, chest discomfort or lower extremity swelling Skin: Denies abnormal skin rashes Lymphatics: Denies new lymphadenopathy or easy bruising Neurological:Denies numbness, tingling or new weaknesses Behavioral/Psych: Mood is stable, no new changes  All other systems were reviewed with the patient and are negative.  I have reviewed the past medical history, past surgical history, social history and family history with the patient and they are unchanged from previous note.  ALLERGIES:  is allergic to macrobid [nitrofurantoin], ciprofloxacin, codeine, and azithromycin.  MEDICATIONS:  Current Outpatient Medications  Medication Sig Dispense Refill   acetaminophen (TYLENOL) 500 MG  tablet Take 1,000 mg by mouth every 6  (six) hours as needed for mild pain, moderate pain, fever or headache.     albuterol (PROVENTIL) (2.5 MG/3ML) 0.083% nebulizer solution Take 2.5 mg by nebulization every 6 (six) hours as needed for wheezing or shortness of breath.     albuterol (VENTOLIN HFA) 108 (90 Base) MCG/ACT inhaler Inhale 2 puffs into the lungs every 6 (six) hours as needed for wheezing or shortness of breath. 8 g 2   alclomethasone (ACLOVATE) 0.05 % ointment Apply 1 application topically 2 (two) times daily.     BIOTIN PO Take 1 tablet by mouth daily.     Cholecalciferol (VITAMIN D-3) 25 MCG (1000 UT) CAPS Take 1,000 Units by mouth daily.     clopidogrel (PLAVIX) 75 MG tablet Take 1 tablet (75 mg total) by mouth daily. 90 tablet 3   co-enzyme Q-10 30 MG capsule Take 30 mg by mouth daily.     dexamethasone (DECADRON) 4 MG tablet Take 2 tabs at the night before and 2 tab the morning of chemotherapy, every 3 weeks, by mouth x 6 cycles 36 tablet 6   estradiol (ESTRACE) 0.1 MG/GM vaginal cream Place 1 Applicatorful vaginally 3 (three) times a week.     fluticasone (FLONASE) 50 MCG/ACT nasal spray Place 1 spray into both nostrils daily. 1 g 2   fluticasone (FLOVENT HFA) 110 MCG/ACT inhaler Inhale 2 puffs into the lungs 2 (two) times daily. 1 each 6   hydrocortisone 2.5 % cream Apply 1 application topically daily as needed (forehead).      lidocaine-prilocaine (EMLA) cream Apply to affected area once 30 g 3   LORazepam (ATIVAN) 0.5 MG tablet Take 1 tablet (0.5 mg total) by mouth 2 (two) times daily as needed for anxiety. 5 tablet 0   losartan (COZAAR) 50 MG tablet Take 1 tablet (50 mg total) by mouth daily. 30 tablet 2   Melatonin 5 MG CHEW Chew 5 mg by mouth at bedtime.      Multiple Vitamins-Minerals (MULTIVITAMIN WITH MINERALS) tablet Take 1 tablet by mouth daily.     omeprazole (PRILOSEC) 40 MG capsule Take 40 mg by mouth daily.     ondansetron (ZOFRAN ODT) 4 MG disintegrating tablet Take 1 tablet (4 mg total) by mouth every  8 (eight) hours as needed for nausea or vomiting. 20 tablet 0   ondansetron (ZOFRAN) 8 MG tablet Take 1 tablet (8 mg total) by mouth every 8 (eight) hours as needed for refractory nausea / vomiting. 30 tablet 1   pantoprazole (PROTONIX) 40 MG tablet Take 1 tablet (40 mg total) by mouth daily. 30 tablet 1   polyethylene glycol (MIRALAX / GLYCOLAX) 17 g packet Take 8.5 g by mouth 2 (two) times daily.     PRESCRIPTION MEDICATION Inhale into the lungs at bedtime. CPAP     prochlorperazine (COMPAZINE) 10 MG tablet Take 1 tablet (10 mg total) by mouth every 6 (six) hours as needed (Nausea or vomiting). 30 tablet 1   rosuvastatin (CRESTOR) 20 MG tablet TAKE ONE TABLET BY MOUTH ONE TIME DAILY 90 tablet 0   traMADol (ULTRAM) 50 MG tablet Take 2 tablets (100 mg total) by mouth every 6 (six) hours as needed. 60 tablet 0   No current facility-administered medications for this visit.    PHYSICAL EXAMINATION: ECOG PERFORMANCE STATUS: 1 - Symptomatic but completely ambulatory  Vitals:   01/20/21 1100  BP: 135/65  Pulse: 70  Resp: 18  Temp: 97.8 F (36.6  C)  SpO2: 98%   Filed Weights   01/20/21 1100  Weight: 244 lb 4.8 oz (110.8 kg)    GENERAL:alert, no distress and comfortable SKIN: skin color, texture, turgor are normal, no rashes or significant lesions EYES: normal, Conjunctiva are pink and non-injected, sclera clear OROPHARYNX:no exudate, no erythema and lips, buccal mucosa, and tongue normal  NECK: supple, thyroid normal size, non-tender, without nodularity LYMPH:  no palpable lymphadenopathy in the cervical, axillary or inguinal LUNGS: clear to auscultation and percussion with normal breathing effort HEART: regular rate & rhythm and no murmurs and no lower extremity edema ABDOMEN:abdomen soft, non-tender and normal bowel sounds Musculoskeletal:no cyanosis of digits and no clubbing  NEURO: alert & oriented x 3 with fluent speech, no focal motor/sensory deficits  LABORATORY DATA:  I  have reviewed the data as listed    Component Value Date/Time   NA 141 01/09/2021 1328   NA 143 12/20/2019 0829   K 4.1 01/09/2021 1328   CL 103 01/09/2021 1328   CO2 28 01/09/2021 1328   GLUCOSE 99 01/09/2021 1328   BUN 9 01/09/2021 1328   BUN 16 12/20/2019 0829   CREATININE 0.67 01/09/2021 1328   CALCIUM 9.2 01/09/2021 1328   PROT 7.1 01/09/2021 1328   PROT 6.4 02/04/2020 1036   ALBUMIN 3.7 01/09/2021 1328   ALBUMIN 4.2 02/04/2020 1036   AST 18 01/09/2021 1328   ALT 15 01/09/2021 1328   ALKPHOS 59 01/09/2021 1328   BILITOT 0.7 01/09/2021 1328   GFRNONAA >60 01/09/2021 1328   GFRAA 104 12/20/2019 0829    No results found for: SPEP, UPEP  Lab Results  Component Value Date   WBC 5.4 01/09/2021   NEUTROABS 3.6 01/09/2021   HGB 13.5 01/09/2021   HCT 42.1 01/09/2021   MCV 88.3 01/09/2021   PLT 271 01/09/2021      Chemistry      Component Value Date/Time   NA 141 01/09/2021 1328   NA 143 12/20/2019 0829   K 4.1 01/09/2021 1328   CL 103 01/09/2021 1328   CO2 28 01/09/2021 1328   BUN 9 01/09/2021 1328   BUN 16 12/20/2019 0829   CREATININE 0.67 01/09/2021 1328      Component Value Date/Time   CALCIUM 9.2 01/09/2021 1328   ALKPHOS 59 01/09/2021 1328   AST 18 01/09/2021 1328   ALT 15 01/09/2021 1328   BILITOT 0.7 01/09/2021 1328       RADIOGRAPHIC STUDIES: I have reviewed MRI with the patient and son I have personally reviewed the radiological images as listed and agreed with the findings in the report. CT CHEST WO CONTRAST  Result Date: 12/28/2020 CLINICAL DATA:  Acute abdominal pain.  Shortness of breath. EXAM: CT CHEST, ABDOMEN, AND PELVIS WITH CONTRAST TECHNIQUE: Multidetector CT imaging of the chest, abdomen and pelvis was performed following the standard protocol during bolus administration of intravenous contrast. CONTRAST:  89m OMNIPAQUE IOHEXOL 300 MG/ML  SOLN COMPARISON:  Chest CT dated 04/27/2020. CT abdomen dated 05/21/2020. FINDINGS: CT CHEST  FINDINGS Cardiovascular: No thoracic aortic aneurysm. No pericardial effusion. Scattered coronary artery calcifications. Mild aortic atherosclerosis. 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 Mediastinum/Nodes: No mass or enlarged lymph nodes within the mediastinum or perihilar regions. Esophagus appears normal. Trachea and central bronchi are unremarkable. Lungs/Pleura: Small chronic pleural effusions with associated atelectasis. Lungs otherwise clear. No pneumothorax. Musculoskeletal: Mild degenerative spondylosis of the kyphotic thoracic spine. No acute-appearing osseous abnormality. CT ABDOMEN PELVIS FINDINGS  Hepatobiliary: No focal liver abnormality is seen. Gallbladder is distended but otherwise unremarkable. No bile duct dilatation is seen. Pancreas: Unremarkable. No pancreatic ductal dilatation or surrounding inflammatory changes. Spleen: Normal in size without focal abnormality. Adrenals/Urinary Tract: Adrenal glands appear normal. Kidneys are unremarkable without suspicious mass, stone or hydronephrosis. No obstructing ureteral stone. Bladder is unremarkable, partially decompressed. Stomach/Bowel: No dilated large or small bowel loops. Extensive diverticulosis of the transverse, descending and sigmoid colon but no focal inflammatory change to suggest acute diverticulitis. Mildly distended small bowel loops throughout the abdomen and pelvis, with fluid and associated air-fluid levels throughout. Stomach is unremarkable, partially decompressed. Vascular/Lymphatic: No acute-appearing vascular abnormality. Mild aortic atherosclerosis. No enlarged lymph nodes are seen in the abdomen or pelvis. Reproductive: Uterus and bilateral adnexa are unremarkable. Other: Fluid stranding and nodular thickening of the anterior mesentery and central mesentery, most suggestive of omental caking suggesting neoplastic process (peritoneal carcinomatosis. Small amount of free  fluid adjacent to the liver and spleen. No circumscribed fluid collection or abscess-like collection is identified. Musculoskeletal: Degenerative spondylosis of the lumbar spine, moderate in degree. No acute or suspicious osseous abnormality. (Lucent lesion within the medial aspect of the LEFT iliac bone, of uncertain significance, possibly a small benign bone island or degenerative subchondral cyst. No additional similar-appearing lesions within the axial skeleton or osseous pelvis. IMPRESSION: 1. 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 Differential for peritoneal carcinomatosis is primarily neoplastic and includes cancers of the ovary, appendix, colon, pancreas and stomach. PET-CT may be helpful for further characterization. Tissue sampling of the mesentery may be eventually required for diagnosis. 2. Mildly distended small bowel loops throughout the abdomen and pelvis, with fluid and associated air-fluid levels throughout the nondistended small bowel, suggesting a mild ileus versus partial small bowel obstruction. Favor partial small bowel obstruction with transition zone in the RIGHT lower quadrant, likely related to aforementioned neoplastic peritoneal implants and/or adhesions. 3. Small free fluid within the RIGHT upper quadrant and LEFT upper quadrant. No abscess collection seen. No free intraperitoneal air seen. 4. Extensive colonic diverticulosis without evidence of acute diverticulitis. 5. Small chronic pleural effusions with associated atelectasis. Aortic Atherosclerosis (ICD10-I70.0). These results were called by telephone at the time of interpretation on 12/28/2020 at 12:27 pm to provider [facility] , who verbally acknowledged these results. Electronically Signed   By: Franki Cabot M.D.   On: 12/28/2020 12:28   MR Brain W Wo  Contrast  Result Date: 01/20/2021 CLINICAL DATA:  Peritoneal carcinomatosis, diplopia, cancer of unknown primary staging; gyn cancer EXAM: MRI HEAD WITHOUT AND WITH CONTRAST TECHNIQUE: Multiplanar, multiecho pulse sequences of the brain and surrounding structures were obtained without and with intravenous contrast. CONTRAST:  72m GADAVIST GADOBUTROL 1 MMOL/ML IV SOLN COMPARISON:  04/01/2020 FINDINGS: Brain: There is no acute infarction or intracranial hemorrhage. There is no intracranial mass, mass effect, or edema. There is no hydrocephalus or extra-axial fluid collection. Ventricles and sulci are normal in size and configuration. Foci of susceptibility on SWI appear to be artifactual. No abnormal enhancement. Vascular: Major vessel flow voids at the skull base are preserved. Skull and upper cervical spine: Normal marrow signal is preserved. Sinuses/Orbits: Paranasal sinuses are aerated. Bilateral lens replacements. Other: Sella is unremarkable.  Mastoid air cells are clear. IMPRESSION: No evidence of intracranial metastatic disease or other acute abnormality. Electronically Signed   By: PMacy MisM.D.   On: 01/20/2021  11:32   CT ABDOMEN PELVIS W CONTRAST  Result Date: 12/28/2020 CLINICAL DATA:  Acute abdominal pain.  Shortness of breath. EXAM: CT CHEST, ABDOMEN, AND PELVIS WITH CONTRAST TECHNIQUE: Multidetector CT imaging of the chest, abdomen and pelvis was performed following the standard protocol during bolus administration of intravenous contrast. CONTRAST:  22m OMNIPAQUE IOHEXOL 300 MG/ML  SOLN COMPARISON:  Chest CT dated 04/27/2020. CT abdomen dated 05/21/2020. FINDINGS: CT CHEST FINDINGS Cardiovascular: No thoracic aortic aneurysm. No pericardial effusion. Scattered coronary artery calcifications. Mild aortic atherosclerosis. 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 Mediastinum/Nodes: No mass or enlarged lymph nodes within the mediastinum or  perihilar regions. Esophagus appears normal. Trachea and central bronchi are unremarkable. Lungs/Pleura: Small chronic pleural effusions with associated atelectasis. Lungs otherwise clear. No pneumothorax. Musculoskeletal: Mild degenerative spondylosis of the kyphotic thoracic spine. No acute-appearing osseous abnormality. CT ABDOMEN PELVIS FINDINGS Hepatobiliary: No focal liver abnormality is seen. Gallbladder is distended but otherwise unremarkable. No bile duct dilatation is seen. Pancreas: Unremarkable. No pancreatic ductal dilatation or surrounding inflammatory changes. Spleen: Normal in size without focal abnormality. Adrenals/Urinary Tract: Adrenal glands appear normal. Kidneys are unremarkable without suspicious mass, stone or hydronephrosis. No obstructing ureteral stone. Bladder is unremarkable, partially decompressed. Stomach/Bowel: No dilated large or small bowel loops. Extensive diverticulosis of the transverse, descending and sigmoid colon but no focal inflammatory change to suggest acute diverticulitis. Mildly distended small bowel loops throughout the abdomen and pelvis, with fluid and associated air-fluid levels throughout. Stomach is unremarkable, partially decompressed. Vascular/Lymphatic: No acute-appearing vascular abnormality. Mild aortic atherosclerosis. No enlarged lymph nodes are seen in the abdomen or pelvis. Reproductive: Uterus and bilateral adnexa are unremarkable. Other: Fluid stranding and nodular thickening of the anterior mesentery and central mesentery, most suggestive of omental caking suggesting neoplastic process (peritoneal carcinomatosis. Small amount of free fluid adjacent to the liver and spleen. No circumscribed fluid collection or abscess-like collection is identified. Musculoskeletal: Degenerative spondylosis of the lumbar spine, moderate in degree. No acute or suspicious osseous abnormality. (Lucent lesion within the medial aspect of the LEFT iliac bone, of uncertain  significance, possibly a small benign bone island or degenerative subchondral cyst. No additional similar-appearing lesions within the axial skeleton or osseous pelvis. IMPRESSION: 1. 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 Differential for peritoneal carcinomatosis is primarily neoplastic and includes cancers of the ovary, appendix, colon, pancreas and stomach. PET-CT may be helpful for further characterization. Tissue sampling of the mesentery may be eventually required for diagnosis. 2. Mildly distended small bowel loops throughout the abdomen and pelvis, with fluid and associated air-fluid levels throughout the nondistended small bowel, suggesting a mild ileus versus partial small bowel obstruction. Favor partial small bowel obstruction with transition zone in the RIGHT lower quadrant, likely related to aforementioned neoplastic peritoneal implants and/or adhesions. 3. Small free fluid within the RIGHT upper quadrant and LEFT upper quadrant. No abscess collection seen. No free intraperitoneal air seen. 4. Extensive colonic diverticulosis without evidence of acute diverticulitis. 5. Small chronic pleural effusions with associated atelectasis. Aortic Atherosclerosis (ICD10-I70.0). These results were called by telephone at the time of interpretation on 12/28/2020 at 12:27 pm [facility], who verbally acknowledged these results. Electronically Signed   By: SFranki CabotM.D.   On: 12/28/2020 12:28   DG Chest Portable 1 View  Result Date: 12/28/2020 CLINICAL DATA:  Severe abdominal pain. Clinical  concern for free peritoneal air. EXAM: PORTABLE CHEST 1 VIEW COMPARISON:  05/21/2020 FINDINGS: Normal sized heart. Clear lungs. No free peritoneal air seen. Thoracic spine degenerative changes. IMPRESSION: No free peritoneal air or other acute  abnormality. Electronically Signed   By: Claudie Revering M.D.   On: 12/28/2020 11:58   DG C-Arm 1-60 Min-No Report  Result Date: 01/09/2021 Fluoroscopy was utilized by the requesting physician.  No radiographic interpretation.   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 EXAM: TRANSABDOMINAL AND TRANSVAGINAL ULTRASOUND OF PELVIS TECHNIQUE: Both transabdominal and transvaginal ultrasound examinations of the pelvis were performed. Transabdominal technique was performed for global imaging of the pelvis including uterus, ovaries, adnexal regions, and pelvic cul-de-sac. It was necessary to proceed with endovaginal exam following the transabdominal exam to visualize the uterus, endometrium, and ovaries. COMPARISON:  Prior CT from 12/28/2020. FINDINGS: Uterus Measurements: 4.8 x 2.9 x 3.9 cm = volume: 29.2 mL. Uterus is retroverted. Heterogeneous echotexture seen within the uterine myometrium without discrete fibroid or other mass. Endometrium Thickness: 5.0 mm.  No focal abnormality visualized. Right ovary Measurements: 1.6 x 1.7 x 1.0 cm = volume: 0.9 mL. Normal appearance/no adnexal mass. Left ovary Measurements: 1.7 x 0.8 x 1.6 cm = volume: 1.2 mL. Normal appearance/no adnexal mass. Other findings No abnormal free fluid. IMPRESSION: Negative pelvic ultrasound with normal sonographic appearance of the uterus, endometrium, and ovaries. No adnexal or ovarian mass identified to explain carcinomatosis. Electronically Signed   By: Jeannine Boga M.D.   On: 01/01/2021 19:39

## 2021-01-20 NOTE — Assessment & Plan Note (Signed)
Her examination is benign We discussed importance of aggressive laxative therapy

## 2021-01-23 ENCOUNTER — Encounter: Payer: Self-pay | Admitting: Hematology and Oncology

## 2021-01-23 NOTE — Progress Notes (Signed)
Called pt to introduce myself as her Arboriculturist.  Unfortunately there aren't any foundations offering copay assistance for her Dx and the type of ins she has.  I informed her of the [employer] and went over what it covers.  Pt stated she's very blessed and doesn't need assistance w/ those things.  She has my number in case she changes her mind in the future.

## 2021-01-24 ENCOUNTER — Ambulatory Visit (HOSPITAL_BASED_OUTPATIENT_CLINIC_OR_DEPARTMENT_OTHER): Payer: Medicare Other

## 2021-01-26 ENCOUNTER — Other Ambulatory Visit: Payer: Self-pay | Admitting: Hematology and Oncology

## 2021-01-26 ENCOUNTER — Inpatient Hospital Stay (HOSPITAL_BASED_OUTPATIENT_CLINIC_OR_DEPARTMENT_OTHER): Payer: Medicare Other | Admitting: Genetic Counselor

## 2021-01-26 ENCOUNTER — Inpatient Hospital Stay: Payer: Medicare Other

## 2021-01-26 ENCOUNTER — Telehealth: Payer: Self-pay

## 2021-01-26 ENCOUNTER — Telehealth: Payer: Self-pay | Admitting: Hematology and Oncology

## 2021-01-26 ENCOUNTER — Ambulatory Visit: Payer: Medicare Other | Admitting: Hematology and Oncology

## 2021-01-26 ENCOUNTER — Other Ambulatory Visit: Payer: Self-pay

## 2021-01-26 DIAGNOSIS — R300 Dysuria: Secondary | ICD-10-CM

## 2021-01-26 DIAGNOSIS — Z801 Family history of malignant neoplasm of trachea, bronchus and lung: Secondary | ICD-10-CM

## 2021-01-26 DIAGNOSIS — C786 Secondary malignant neoplasm of retroperitoneum and peritoneum: Secondary | ICD-10-CM | POA: Diagnosis not present

## 2021-01-26 DIAGNOSIS — N390 Urinary tract infection, site not specified: Secondary | ICD-10-CM

## 2021-01-26 DIAGNOSIS — C482 Malignant neoplasm of peritoneum, unspecified: Secondary | ICD-10-CM | POA: Diagnosis not present

## 2021-01-26 DIAGNOSIS — Z803 Family history of malignant neoplasm of breast: Secondary | ICD-10-CM

## 2021-01-26 HISTORY — DX: Dysuria: R30.0

## 2021-01-26 LAB — URINALYSIS, COMPLETE (UACMP) WITH MICROSCOPIC
Bilirubin Urine: NEGATIVE
Glucose, UA: NEGATIVE mg/dL
Ketones, ur: NEGATIVE mg/dL
Nitrite: POSITIVE — AB
Protein, ur: 100 mg/dL — AB
Specific Gravity, Urine: 1.017 (ref 1.005–1.030)
WBC, UA: >50 WBC/hpf — ABNORMAL HIGH (ref 0–5)
pH: 5 (ref 5.0–8.0)

## 2021-01-26 NOTE — Telephone Encounter (Signed)
Called and left a message asking her to call the office back. 

## 2021-01-26 NOTE — Telephone Encounter (Signed)
Called and spoke with son. Told him Juliann Pulse needs to go to the ER at [facility] to be admitted for IV antibiotics for UTI. He verbralzied understanding and will try to contact his Mom.

## 2021-01-26 NOTE — Telephone Encounter (Signed)
She called back and instructed to go to the ER to be admitted to for IV antibiotics for UTI. She verbalized understanding and will go now to ER at [facility].

## 2021-01-26 NOTE — Telephone Encounter (Signed)
Called and left another message asking her to call the office back.  

## 2021-01-26 NOTE — Telephone Encounter (Signed)
The patient requested urine to be tested for UTI because of symptoms She received chemotherapy 2 weeks ago I have reviewed her urinalysis that came back abnormal, suspicious for recurrent UTI Based on her prior culture and sensitivity and present of ESBL, I consulted infectious disease on-call briefly over the phone The only oral antibiotics available would be nitrofurantoin but the patient is allergic to that Unfortunately, based on current available information, there is no outpatient regiment available to treat her and she is immunocompromise I will get my nursing staff to call the patient and direct her to the emergency department to be evaluated and admitted for IV antibiotics and to consult infectious disease team given her recurrent ESBL

## 2021-01-26 NOTE — Telephone Encounter (Addendum)
She called back and after speaking with her son's she is going to the ER at [facility].

## 2021-01-27 ENCOUNTER — Encounter: Payer: Self-pay | Admitting: Genetic Counselor

## 2021-01-27 ENCOUNTER — Ambulatory Visit: Payer: Medicare Other | Admitting: Physical Therapy

## 2021-01-27 ENCOUNTER — Encounter: Payer: Self-pay | Admitting: Hematology and Oncology

## 2021-01-27 DIAGNOSIS — Z801 Family history of malignant neoplasm of trachea, bronchus and lung: Secondary | ICD-10-CM

## 2021-01-27 DIAGNOSIS — Z803 Family history of malignant neoplasm of breast: Secondary | ICD-10-CM

## 2021-01-27 HISTORY — DX: Family history of malignant neoplasm of trachea, bronchus and lung: Z80.1

## 2021-01-27 HISTORY — DX: Family history of malignant neoplasm of breast: Z80.3

## 2021-01-27 NOTE — Progress Notes (Signed)
REFERRING PROVIDER: [name], MD [address] 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 Family history of breast cancer   3. Family history of lung cancer    HISTORY OF PRESENT ILLNESS:   Ms. Vanhandel, a 70 y.o. female, was seen for a [place] cancer genetics consultation at the request of Dr. [name] due to a personal and family history of cancer.  Ms. Michaux presents to clinic today to discuss the possibility of a hereditary predisposition to cancer, to discuss genetic testing, and to further clarify her future cancer risks, as well as potential cancer risks for family members.   In July 2022, at the age of 9, Ms. Mcquire was diagnosed with peritoneal cancer. The treatment plan includes chemotherapy with interval debulking.    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 - Diverticulosis in the sigmoid colon and in the descending colon. - No specimens collected. - Blood in stool without cause found on endoscopic exam   05/08/2020 Imaging   1. Bladder is decompressed though demonstrates significant perivesicular hazy stranding, mucosal hyperemia and edematous mural thickening. Findings are suggestive of cystitis. Correlate with urinalysis. No abnormal perinephric or periureteral stranding or other features to suggest an ascending tract infection at this time. 2. Colonic diverticulosis without evidence of acute diverticulitis. 3. Aortic Atherosclerosis (ICD10-I70.0).   12/28/2020 Imaging   1. Widespread nodular thickening of the anterior mesentery  and central mesentery, with mild fluid stranding, most suggestive of omental caking related to neoplastic process (peritoneal carcinomatosis). Differential for peritoneal carcinomatosis is primarily neoplastic and includes cancers of the ovary, appendix, colon, pancreas and stomach. PET-CT may be helpful for further characterization. Tissue sampling of the mesentery may be eventually required for diagnosis. 2. Mildly distended small bowel loops throughout the abdomen and pelvis, with fluid and associated air-fluid levels throughout the nondistended small bowel, suggesting a mild ileus versus partial small bowel obstruction. Favor partial small bowel obstruction with transition zone in the RIGHT lower quadrant, likely related to aforementioned neoplastic peritoneal implants and/or adhesions. 3. Small free fluid within the RIGHT upper quadrant and LEFT upper quadrant. No abscess collection seen. No free intraperitoneal air seen. 4. Extensive colonic diverticulosis without evidence of acute diverticulitis. 5. Small chronic pleural effusions with associated atelectasis.   12/29/2020 Tumor Marker   Patient's tumor was tested for the following markers: CA-125. Results of the tumor marker test revealed 95.1.   12/30/2020 Surgery   Preoperative diagnosis: abdominal nodules   Postoperative diagnosis: same   Procedure: diagnostic laparoscopy with abdominal wall biopsy   Surgeon: Gurney Maxin, M.D.    Indications for procedure: Mega Kinkade is a 70 y.o. year old female with symptoms of nausea, vomiting, and abdominal pain. Work up was concerning for cancer with abdominal wall studding.   Description of procedure: The patient was brought into the operative suite. Anesthesia was administered with General endotracheal anesthesia. WHO checklist was applied. The patient was then placed in supine position. The area was prepped and draped in the usual sterile fashion.   A small left subcostal incision  was made. A 81m trocar was used to gain access to the peritoneal cavity by optical entry technique. Pneumoperitoneum was applied with a high flow and low pressure. The laparoscope was reinserted to confirm position.   On initial visualization of the abdomen, there were multiple nodules throughout the abdominal wall. There were multiple nodules over the small intestine and mesentery. There was some loops of small intestine that were matted together. There was a large white mass in the left upper quadrant. Multiple areas of peritoneum were removed with harmonic scalpel. The large mass was inspected. It appeared to be related to the colon and omentum. Grasper was used to remove some superficial tissue and was sent for culture.   The abdomen was desufflated. The incisions were closed with 4-0 monocryl subcuticular stitch.   12/30/2020 Pathology Results   FINAL MICROSCOPIC DIAGNOSIS:   A. ABDOMINAL WALL, NODULE, EXCISION:  -  Metastatic adenocarcinoma  -  See comment   B. PERI COLONIC EXUDATE, EXCISION:  -  Metastatic adenocarcinoma  -  See comment   COMMENT:   By immunohistochemistry, the neoplastic cells are positive for cytokeratin 7, PAX8 and WT1 but negative for cytokeratin 20, CDX2, p53 and TTF-1.  The morphology and immunophenotype are consistent with a gynecologic primary.     01/01/2021 Initial Diagnosis   Peritoneal carcinomatosis ([place]    01/08/2021 Cancer Staging   Staging form: Ovary, Fallopian Tube, and Primary Peritoneal Carcinoma, AJCC 8th Edition - Clinical stage from 01/08/2021: cT3, cN0, cM0 - Signed by GHeath Lark MD on 01/08/2021  Stage prefix: Initial diagnosis    01/12/2021 -  Chemotherapy    Patient is on Treatment Plan: OVARIAN CARBOPLATIN (AUC 6) / PACLITAXEL (175) Q21D X 6 CYCLES       01/17/2021 Imaging   MRI brain No evidence of intracranial metastatic disease or other acute abnormality      RISK FACTORS:  Menarche was at age 70  First live birth at  age 70  OCP use for approximately 5 years.  Ovaries intact: yes.  Hysterectomy: no.  Menopausal status: postmenopausal.  HRT use: 0 years. Colonoscopy: yes;  most recent in 2022 . Mammogram within the last year: yes. Number of breast biopsies: 0.   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   Family history of breast cancer 01/27/2021   Gastrointestinal bleeding 07/25/2018   Gastrointestinal hemorrhage with melena 07/16/2018   GERD (gastroesophageal reflux disease)    Hepatic steatosis    per imaging 2016, 2019   Hiatal hernia    History of palpitations    Holter monitor, 2017: NSR, occasional PVC, PACs, no arrhythmia   HLD (hyperlipidemia) 02/27/2018   HTN (hypertension) 02/27/2018   Hypercholesteremia    Hypercholesterolemia 04/07/2017   Hypertension    Hypertensive disorder 04/07/2017   Hypokalemia 07/17/2018   Lumbar disc disease    MRI, 2017    Morbid obesity ([place] 05/22/2018   Overweight 03/03/2018   Palpitations 08/19/2015   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[place]    Pneumonia    Pyelonephritis 03/30/2020   Sepsis secondary to UTI ([place] 04/25/2020   Severe sepsis with acute organ dysfunction ([place] 04/25/2020   Sleep apnea  UTI (urinary tract infection)     Past Surgical History:  Procedure Laterality Date   BLADDER REPAIR     BREAST EXCISIONAL BIOPSY Left    CARPAL TUNNEL RELEASE Right    CATARACT EXTRACTION, BILATERAL  2014   COLONOSCOPY WITH PROPOFOL N/A 07/18/2018   Procedure: COLONOSCOPY WITH PROPOFOL;  Surgeon: Laurence Spates, MD;  Location: [place];  Service: Endoscopy;  Laterality: N/A;   ESOPHAGOGASTRODUODENOSCOPY (EGD) WITH PROPOFOL N/A 07/17/2018   Procedure:  ESOPHAGOGASTRODUODENOSCOPY (EGD) WITH PROPOFOL;  Surgeon: Laurence Spates, MD;  Location: [facility];  Service: Endoscopy;  Laterality: N/A;   LAPAROSCOPY N/A 12/30/2020   Procedure: LAPAROSCOPY DIAGNOSTIC; WITH  BIOPSY OF ABDOMINAL WALL NODULES AND BIOPSY OF PERICOLONIC EXUDATE;  Surgeon: Kinsinger, Arta Bruce, MD;  Location: [facility];  Service: General;  Laterality: N/A;   PORTACATH PLACEMENT N/A 01/09/2021   Procedure: PORT INSERTION WITH [place] & FLUORO;  Surgeon: Kieth Brightly Arta Bruce, MD;  Location: [facility];  Service: General;  Laterality: N/A;   REPAIR RECTOCELE     TOE SURGERY Left     Social History   Socioeconomic History   Marital status: Divorced    Spouse name: Not on file   Number of children: 2   Years of education: Not on file   Highest education level: Master's degree (e.g., MA, MS, MEng, MEd, MSW, MBA)  Occupational History   Occupation: Retired  Tobacco Use   Smoking status: Never   Smokeless tobacco: Never  Vaping Use   Vaping Use: Never used  Substance and Sexual Activity   Alcohol use: No   Drug use: No   Sexual activity: Not on file  Other Topics Concern   Not on file  Social History Narrative   Not on file   Social Determinants of Health   Financial Resource Strain: Not on file  Food Insecurity: Not on file  Transportation Needs: Not on file  Physical Activity: Not on file  Stress: Not on file  Social Connections: Not on file     FAMILY HISTORY:  We obtained a detailed, 4-generation family history.  Significant diagnoses are listed below: Family History  Problem Relation Age of Onset   Breast cancer Mother 37   Lung cancer Father 73   Cancer Paternal 93        GYN cancer? bladder? d. early 72s   Cancer Paternal Aunt 91       GYN cancer   Lung cancer Paternal Grandfather        d. 84; smoking hx    Ms. Ingman is unaware of previous family history of genetic testing for hereditary cancer risks. There is no reported Ashkenazi Jewish ancestry.  There is no known consanguinity.  GENETIC COUNSELING ASSESSMENT: Ms. Armel is a 70 y.o. female with a personal history of cancer which is somewhat suggestive of a hereditary cancer syndrome and predisposition to cancer given her diagnosis of peritoneal cancer. We, therefore, discussed and recommended the following at today's visit.   DISCUSSION: We discussed that 5 - 10% of cancer is hereditary, with most cases of hereditary ovarian/peritoneal cancer associated with mutations in BRCA1/2.  There are other genes that can be associated with hereditary ovarian/peritoneal cancer syndromes.  These include but are not limited to ATM, RAD51C, RAD51D, and BRIP1.  We discussed that testing is beneficial for several reasons including knowing how to follow individuals after completing their treatment, identifying whether potential treatment options such as PARP inhibitors would be beneficial, and understanding if other family members  could be at risk for cancer and allowing them to undergo genetic testing.   We reviewed the characteristics, features and inheritance patterns of hereditary cancer syndromes. We also discussed genetic testing, including the appropriate family members to test, the process of testing, insurance coverage and turn-around-time for results.  We discussed the implications of a negative, positive, carrier and/or variant of uncertain significant result. We recommended Ms. Folkert pursue genetic testing for a panel that includes genes associated with peritoneal and breast cancer as well has homologous recombination testing (HRD) on the tumor.    The [facility] gene panel offered by [employer] includes sequencing and deletion/duplication testing of the following 48 genes: APC, ATM, AXIN2, BAP1, BARD1, BMPR1A, BRCA1, BRCA2, BRIP1, CHD1, CDK4, CDKN2A(p16 and p14ARF), CHEK2, CTNNA1, EGFR, EPCAM, FH, FLCN, GREM1, HOXB13, MEN1, MET, MITF , MLH1, MSH2, MSH3, MSH6, MUTYH, NTHL1, PALB2, PMS2,  POLD1, POLE, PTEN, RAD51C, RAD51D, RET, SDHA, SDHB, SDHC, SDHD, SMAD4, STK11,TERT, TP53, TSC1, TSC2, and VHL.  Unless otherwise specified, all coding regions and flanking non-coding regions are analyzed for sequence variation. Analysis of flanking intronic regions typically do not extend more than 20 bp before and 10 bp after each exon, though the exact region may be adjusted based on the presence of either potentially significant variants or highly repetitive sequences. Coding regions and proximal promoter regions near the transcription start sites are analyzed for large deletions or duplications. Specific genes are tested only for sequence and/or CNVs within limited regions. Limited clinically relevant regions are included for EGFR (sequencing and CNV analysis of exons 18-21),RET (sequencing and CNV analysis of exons 5, 8, 10, 11, and 13-16), and MITF (sequencing of position c.952). Only CNV analysis of the last two exons of EPCAM is performed. CNV analysis of GREM1 includes the upstream region overlapping the adjacent gene SCG5. MSH3 exon 1 contains a long polyalanine repeat that can interfere with variant calling; therefore, MSH3 analysis excludes c.121 to c.237. Only sequence analysis of the exons encompassing the exonuclease domains of these genes is performed (POLD1 c.841 to c.1686, POLE c.802 to c.1473). Limited promoter regions in selected genes undergo sequence analysis including TERT (c.-71 to c.-1), and APC Promoter 1B (c.-195 to c.-190 and c.-125 ([id number]).   We discussed that genetic testing through [facility] will test for hereditary mutations that could explain her diagnosis of cancer and inform cancer management/prevention strategies for family members.  However, homologous recombination testing (HRD) is genetic testing performed on the tumor/biopsy that can determine genetic changes that could influence her management, such as eligibility for targeted therapies.  [facility]Choice CDx is a  next generation sequencing-based in vitro diagnostic test that assesses the qualitative detection and classification of single nucleotide variants, insertions and deletions, and large rearrangement variants in protein coding regions and intron/exon boundaries of the BRCA1 and BRCA2 genes and the determination of Genomic Instability Score (GIS) which is an algorithmic measurement of Loss of Heterozygosity (LOH), Telomeric Allelic Imbalance (TAI), and Large-scale State Transitions (LST).    Based on Ms. Reidinger's personal history of cancer, she meets medical criteria for genetic testing. Despite that she meets criteria, she may still have an out of pocket cost.   PLAN: After considering the risks, benefits, and limitations, Ms. Limpert provided informed consent to pursue genetic testing.  Her blood will be drawn on 8/15 during her next appointment and sent to [facility] for analysis of MyRisk genes.  [facility] will request a sample of her biopsy for HRD testing.  Results should be available within  approximately 4-6 weeks' time, at which point they will be disclosed by telephone to Ms. Trainer, as will any additional recommendations warranted by these results. Ms. Jezek will receive a summary of her genetic counseling visit and a copy of her results once available. This information will also be available in Epic.   Lastly, we encouraged Ms. Piascik to remain in contact with cancer genetics annually so that we can continuously update the family history and inform her of any changes in cancer genetics and testing that may be of benefit for this family.   Ms. [place]ton questions were answered to her satisfaction today. Our contact information was provided should additional questions or concerns arise. Thank you for the referral and allowing [place] to share in the care of your patient.   Pier Laux M. Joette Catching, [place], [facility] Genetic Counselor [email or web address] (P) [phone]  The patient was seen for a total of 40 minutes  in face-to-face genetic counseling.  The patient was seen alone.  Drs. Magrinat, Lindi Adie and/or Burr Medico were available to discuss this case as needed.    _______________________________________________________________________ For Office Staff:  Number of people involved in session: 1 Was an Intern/ student involved with case: yes; student Leretha Pol observed this session

## 2021-01-28 ENCOUNTER — Telehealth: Payer: Self-pay | Admitting: Oncology

## 2021-01-28 ENCOUNTER — Other Ambulatory Visit: Payer: Self-pay | Admitting: Hematology and Oncology

## 2021-01-28 ENCOUNTER — Encounter (HOSPITAL_COMMUNITY): Payer: Self-pay | Admitting: General Surgery

## 2021-01-28 LAB — URINE CULTURE: Culture: >=100000 — AB

## 2021-01-28 NOTE — Telephone Encounter (Signed)
Called Brittany Middleton and advised her that the urine culture showed ESBL which is identical to previous culture results and sensitivities.  Brittany Middleton verbalized understanding and said she is still an inpatient at [facility]  She should be discharged tomorrow and will receive home IV antibiotic infusions through her port.    Her son [name] was also on the phone and said one of the doctors at [facility] mentioned that [facility] cancer may have originated in her stomach.  He is wondering if Dr. [name] thinks her cancer could have a different origin than GYN.  He mentioned that Brittany Middleton is scheduled for surgery with Dr. Fransisca Connors in 2 months and just wants to be sure that it is a GYN origin.

## 2021-01-29 ENCOUNTER — Encounter: Payer: Self-pay | Admitting: Hematology and Oncology

## 2021-01-29 ENCOUNTER — Other Ambulatory Visit: Payer: Self-pay | Admitting: Hematology and Oncology

## 2021-01-29 NOTE — Telephone Encounter (Signed)
Called Brittany Middleton back and advised her that Dr. [name] is not able to answer Matthew's question over the phone and discussed transferring her treatment to [facility].  Brittany Middleton said she would like to keep her treatment in [place] with Dr. [name] and she wants to make sure she is good to go for her infusion on Monday.  She is being discharged from the hospital this afternoon.

## 2021-01-30 ENCOUNTER — Encounter: Payer: Self-pay | Admitting: Hematology and Oncology

## 2021-01-30 MED FILL — Fosaprepitant Dimeglumine For IV Infusion 150 MG (Base Eq): INTRAVENOUS | Qty: 5 | Status: AC

## 2021-01-30 MED FILL — Dexamethasone Sodium Phosphate Inj 100 MG/10ML: INTRAMUSCULAR | Qty: 1 | Status: AC

## 2021-01-30 NOTE — Telephone Encounter (Signed)
Called Juliann Pulse back and advised her that per Dr. [name], she cannot have IV antibiotics and IV chemo at the same time.  Dr. [name] has reviewed the notes from [place] and saw that Kathy's last day of IV antibiotics will be 02/05/21.  She needs to finish the antibiotics and then repeat UA and culture and maybe restart chemo on 02/09/21.  Also rescheduled apt with Dr. [name] to 3:00 on 02/02/21.  Juliann Pulse verbalized agreement and said she will have her last dose of antibiotics on 02/04/21 and wanted to know if should would be able to restart chemotherapy earlier.  Advised to discuss this at her appointment on Monday.

## 2021-02-02 ENCOUNTER — Inpatient Hospital Stay (HOSPITAL_BASED_OUTPATIENT_CLINIC_OR_DEPARTMENT_OTHER): Payer: Medicare Other | Admitting: Hematology and Oncology

## 2021-02-02 ENCOUNTER — Ambulatory Visit: Payer: Medicare Other | Admitting: Hematology and Oncology

## 2021-02-02 ENCOUNTER — Other Ambulatory Visit: Payer: Self-pay

## 2021-02-02 ENCOUNTER — Ambulatory Visit: Payer: Medicare Other

## 2021-02-02 ENCOUNTER — Inpatient Hospital Stay: Payer: Medicare Other

## 2021-02-02 ENCOUNTER — Encounter: Payer: Self-pay | Admitting: Hematology and Oncology

## 2021-02-02 ENCOUNTER — Inpatient Hospital Stay: Payer: Medicare Other | Admitting: Hematology and Oncology

## 2021-02-02 ENCOUNTER — Other Ambulatory Visit: Payer: Self-pay | Admitting: Genetic Counselor

## 2021-02-02 ENCOUNTER — Other Ambulatory Visit: Payer: Medicare Other

## 2021-02-02 VITALS — BP 126/64 | HR 67 | Temp 97.7°F | Resp 18 | Ht 65.0 in | Wt 243.0 lb

## 2021-02-02 DIAGNOSIS — C762 Malignant neoplasm of abdomen: Secondary | ICD-10-CM | POA: Diagnosis not present

## 2021-02-02 DIAGNOSIS — C786 Secondary malignant neoplasm of retroperitoneum and peritoneum: Secondary | ICD-10-CM

## 2021-02-02 DIAGNOSIS — B9629 Other Escherichia coli [E. coli] as the cause of diseases classified elsewhere: Secondary | ICD-10-CM

## 2021-02-02 DIAGNOSIS — N390 Urinary tract infection, site not specified: Secondary | ICD-10-CM | POA: Diagnosis not present

## 2021-02-02 DIAGNOSIS — A419 Sepsis, unspecified organism: Secondary | ICD-10-CM | POA: Diagnosis not present

## 2021-02-02 DIAGNOSIS — C482 Malignant neoplasm of peritoneum, unspecified: Secondary | ICD-10-CM | POA: Diagnosis not present

## 2021-02-02 DIAGNOSIS — Z1612 Extended spectrum beta lactamase (ESBL) resistance: Secondary | ICD-10-CM

## 2021-02-02 NOTE — Assessment & Plan Note (Signed)
She had recent recurrent ESBL and is on antibiotics I recommend she finish her antibiotics as recommended by infectious disease on Thursday I plan to recheck urinalysis and urine culture before resuming chemotherapy next week

## 2021-02-02 NOTE — Assessment & Plan Note (Signed)
Her son raise a question about the diagnosis and treatment I reviewed pathology with the patient and [facility] has concurred with the diagnosis We will proceed with treatment next week I prefer for her to finish her antibiotics first She will return on Thursday to have repeat labs and to get her port be accessed After 3 cycles of treatment, she will return to [facility] for repeat imaging study and surgery

## 2021-02-02 NOTE — Assessment & Plan Note (Signed)
She has major improvement of her abdominal symptoms Clinically, she appears to be responding to chemotherapy Overall, she tolerated treatment very well except for recurrent ESBL UTI We will continue treatment as scheduled I reviewed the plan of care with the patient again and after 3 cycles of treatment, she will repeat imaging study and proceed with interval debulking surgery We discussed importance of regular laxatives She denies the need to take pain medicine

## 2021-02-02 NOTE — Progress Notes (Signed)
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 about the diagnosis and treatment I reviewed pathology with the patient and [facility] has concurred with the diagnosis We will proceed with treatment next week I prefer for her to finish her antibiotics first She will return on Thursday to have repeat labs and to get her port be accessed After 3 cycles of treatment, she will return to [facility] for repeat imaging study and surgery  Urinary tract infection due to extended-spectrum beta lactamase (ESBL) producing Escherichia coli She had recent recurrent ESBL and is on antibiotics I recommend she finish her antibiotics as recommended by infectious disease on Thursday I plan to recheck urinalysis and urine culture before resuming chemotherapy next week  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[place]) She has major improvement of her abdominal symptoms Clinically, she appears to be responding to chemotherapy Overall, she tolerated treatment very well except for recurrent ESBL UTI We will continue treatment as scheduled I reviewed the plan of care with the patient again and after 3 cycles of treatment, she will repeat imaging study and proceed with interval debulking surgery We discussed importance of regular laxatives She denies the need to take pain medicine  Orders Placed This Encounter  Procedures   Urine Culture    Standing Status:   Future    Standing Expiration Date:   02/02/2022   Urinalysis, Complete w Microscopic    Standing Status:   Future    Standing Expiration Date:   02/02/2022    All questions were answered. The patient knows to call the clinic with any problems, questions or concerns. The total time spent in  the appointment was 30 minutes encounter with patients including review of chart and various tests results, discussions about plan of care and coordination of care plan   [name], MD 02/02/2021 4:32 PM  INTERVAL HISTORY: Please see below for problem oriented charting. 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 - Diverticulosis in the sigmoid colon and in the descending colon. - No specimens collected. - Blood in stool without cause found on endoscopic exam   05/08/2020 Imaging   1. Bladder is decompressed though demonstrates significant perivesicular hazy stranding, mucosal hyperemia and edematous mural thickening. Findings are suggestive of cystitis. Correlate with urinalysis. No abnormal perinephric or periureteral stranding or other features to suggest an ascending tract infection at this time. 2. Colonic diverticulosis without evidence of acute diverticulitis. 3. Aortic Atherosclerosis (ICD10-I70.0).   12/28/2020 Imaging   1. Widespread nodular  thickening of the anterior mesentery and central mesentery, with mild fluid stranding, most suggestive of omental caking  related to neoplastic process (peritoneal carcinomatosis). Differential for peritoneal carcinomatosis is primarily neoplastic and includes cancers of the ovary, appendix, colon, pancreas and stomach. PET-CT may be helpful for further characterization. Tissue sampling of the mesentery may be eventually required for diagnosis. 2. Mildly distended small bowel loops throughout the abdomen and pelvis, with fluid and associated air-fluid levels throughout the nondistended small bowel, suggesting a mild ileus versus partial small bowel obstruction. Favor partial small bowel obstruction with transition zone in the RIGHT lower quadrant, likely related to aforementioned neoplastic peritoneal implants and/or adhesions. 3. Small free fluid within the RIGHT upper quadrant and LEFT upper quadrant. No abscess collection seen. No free intraperitoneal air seen. 4. Extensive colonic diverticulosis without evidence of acute diverticulitis. 5. Small chronic pleural effusions with associated atelectasis.   12/29/2020 Tumor Marker   Patient's tumor was tested for the following markers: CA-125. Results of the tumor marker test revealed 95.1.   12/30/2020 Surgery   Preoperative diagnosis: abdominal nodules   Postoperative diagnosis: same   Procedure: diagnostic laparoscopy with abdominal wall biopsy   Surgeon: Gurney Maxin, M.D.    Indications for procedure: Copeland Lapier is a 70 y.o. year old female with symptoms of nausea, vomiting, and abdominal pain. Work up was concerning for cancer with abdominal wall studding.   Description of procedure: The patient was brought into the operative suite. Anesthesia was administered with General endotracheal anesthesia. WHO checklist was applied. The patient was then placed in supine position. The area was prepped and draped in the usual sterile fashion.   A small left subcostal incision was made. A 29m trocar was used to gain access to the peritoneal cavity by optical  entry technique. Pneumoperitoneum was applied with a high flow and low pressure. The laparoscope was reinserted to confirm position.   On initial visualization of the abdomen, there were multiple nodules throughout the abdominal wall. There were multiple nodules over the small intestine and mesentery. There was some loops of small intestine that were matted together. There was a large white mass in the left upper quadrant. Multiple areas of peritoneum were removed with harmonic scalpel. The large mass was inspected. It appeared to be related to the colon and omentum. Grasper was used to remove some superficial tissue and was sent for culture.   The abdomen was desufflated. The incisions were closed with 4-0 monocryl subcuticular stitch.   12/30/2020 Pathology Results   FINAL MICROSCOPIC DIAGNOSIS:   A. ABDOMINAL WALL, NODULE, EXCISION:  -  Metastatic adenocarcinoma  -  See comment   B. PERI COLONIC EXUDATE, EXCISION:  -  Metastatic adenocarcinoma  -  See comment   COMMENT:   By immunohistochemistry, the neoplastic cells are positive for cytokeratin 7, PAX8 and WT1 but negative for cytokeratin 20, CDX2, p53 and TTF-1.  The morphology and immunophenotype are consistent with a gynecologic primary.     01/01/2021 Initial Diagnosis   Peritoneal carcinomatosis ([place]   01/08/2021 Cancer Staging   Staging form: Ovary, Fallopian Tube, and Primary Peritoneal Carcinoma, AJCC 8th Edition - Clinical stage from 01/08/2021: cT3, cN0, cM0 - Signed by GHeath Lark MD on 01/08/2021 Stage prefix: Initial diagnosis   01/12/2021 -  Chemotherapy    Patient is on Treatment Plan: OVARIAN CARBOPLATIN (AUC 6) / PACLITAXEL (175) Q21D X 6 CYCLES       01/17/2021 Imaging   MRI brain No evidence of intracranial  metastatic disease or other acute abnormality     REVIEW OF SYSTEMS:   Constitutional: Denies fevers, chills or abnormal weight loss Eyes: Denies blurriness of vision Ears, nose, mouth, throat, and  face: Denies mucositis or sore throat Respiratory: Denies cough, dyspnea or wheezes Cardiovascular: Denies palpitation, chest discomfort or lower extremity swelling Gastrointestinal:  Denies nausea, heartburn or change in bowel habits Skin: Denies abnormal skin rashes Lymphatics: Denies new lymphadenopathy or easy bruising Neurological:Denies numbness, tingling or new weaknesses Behavioral/Psych: Mood is stable, no new changes  All other systems were reviewed with the patient and are negative.  I have reviewed the past medical history, past surgical history, social history and family history with the patient and they are unchanged from previous note.  ALLERGIES:  is allergic to macrobid [nitrofurantoin], ciprofloxacin, codeine, and azithromycin.  MEDICATIONS:  Current Outpatient Medications  Medication Sig Dispense Refill   acetaminophen (TYLENOL) 500 MG tablet Take 1,000 mg by mouth every 6 (six) hours as needed for mild pain, moderate pain, fever or headache.     albuterol (PROVENTIL) (2.5 MG/3ML) 0.083% nebulizer solution Take 2.5 mg by nebulization every 6 (six) hours as needed for wheezing or shortness of breath.     albuterol (VENTOLIN HFA) 108 (90 Base) MCG/ACT inhaler Inhale 2 puffs into the lungs every 6 (six) hours as needed for wheezing or shortness of breath. 8 g 2   alclomethasone (ACLOVATE) 0.05 % ointment Apply 1 application topically 2 (two) times daily.     BIOTIN PO Take 1 tablet by mouth daily.     Cholecalciferol (VITAMIN D-3) 25 MCG (1000 UT) CAPS Take 1,000 Units by mouth daily.     clopidogrel (PLAVIX) 75 MG tablet Take 1 tablet (75 mg total) by mouth daily. 90 tablet 3   co-enzyme Q-10 30 MG capsule Take 30 mg by mouth daily.     dexamethasone (DECADRON) 4 MG tablet Take 2 tabs at the night before and 2 tab the morning of chemotherapy, every 3 weeks, by mouth x 6 cycles 36 tablet 6   estradiol (ESTRACE) 0.1 MG/GM vaginal cream Place 1 Applicatorful vaginally 3  (three) times a week.     fluticasone (FLONASE) 50 MCG/ACT nasal spray Place 1 spray into both nostrils daily. 1 g 2   fluticasone (FLOVENT HFA) 110 MCG/ACT inhaler Inhale 2 puffs into the lungs 2 (two) times daily. 1 each 6   hydrocortisone 2.5 % cream Apply 1 application topically daily as needed (forehead).      lidocaine-prilocaine (EMLA) cream Apply to affected area once 30 g 3   losartan (COZAAR) 50 MG tablet Take 1 tablet (50 mg total) by mouth daily. 30 tablet 2   Melatonin 5 MG CHEW Chew 5 mg by mouth at bedtime.      Multiple Vitamins-Minerals (MULTIVITAMIN WITH MINERALS) tablet Take 1 tablet by mouth daily.     omeprazole (PRILOSEC) 40 MG capsule Take 40 mg by mouth daily.     ondansetron (ZOFRAN ODT) 4 MG disintegrating tablet Take 1 tablet (4 mg total) by mouth every 8 (eight) hours as needed for nausea or vomiting. 20 tablet 0   ondansetron (ZOFRAN) 8 MG tablet Take 1 tablet (8 mg total) by mouth every 8 (eight) hours as needed for refractory nausea / vomiting. 30 tablet 1   pantoprazole (PROTONIX) 40 MG tablet Take 1 tablet (40 mg total) by mouth daily. 30 tablet 1   polyethylene glycol (MIRALAX / GLYCOLAX) 17 g packet Take 8.5 g by mouth 2 (  two) times daily.     PRESCRIPTION MEDICATION Inhale into the lungs at bedtime. CPAP     prochlorperazine (COMPAZINE) 10 MG tablet Take 1 tablet (10 mg total) by mouth every 6 (six) hours as needed (Nausea or vomiting). 30 tablet 1   rosuvastatin (CRESTOR) 20 MG tablet TAKE ONE TABLET BY MOUTH ONE TIME DAILY 90 tablet 0   traMADol (ULTRAM) 50 MG tablet Take 2 tablets (100 mg total) by mouth every 6 (six) hours as needed. 60 tablet 0   No current facility-administered medications for this visit.    PHYSICAL EXAMINATION: ECOG PERFORMANCE STATUS: 1 - Symptomatic but completely ambulatory  Vitals:   02/02/21 1522  BP: 126/64  Pulse: 67  Resp: 18  Temp: 97.7 F (36.5 C)  SpO2: 95%   Filed Weights   02/02/21 1522  Weight: 243 lb (110.2  kg)    GENERAL:alert, no distress and comfortable SKIN: skin color, texture, turgor are normal, no rashes or significant lesions EYES: normal, Conjunctiva are pink and non-injected, sclera clear OROPHARYNX:no exudate, no erythema and lips, buccal mucosa, and tongue normal  NECK: supple, thyroid normal size, non-tender, without nodularity LYMPH:  no palpable lymphadenopathy in the cervical, axillary or inguinal LUNGS: clear to auscultation and percussion with normal breathing effort HEART: regular rate & rhythm and no murmurs and no lower extremity edema ABDOMEN:abdomen soft, non-tender and normal bowel sounds Musculoskeletal:no cyanosis of digits and no clubbing  NEURO: alert & oriented x 3 with fluent speech, no focal motor/sensory deficits  LABORATORY DATA:  I have reviewed the data as listed    Component Value Date/Time   NA 141 01/09/2021 1328   NA 143 12/20/2019 0829   K 4.1 01/09/2021 1328   CL 103 01/09/2021 1328   CO2 28 01/09/2021 1328   GLUCOSE 99 01/09/2021 1328   BUN 9 01/09/2021 1328   BUN 16 12/20/2019 0829   CREATININE 0.67 01/09/2021 1328   CALCIUM 9.2 01/09/2021 1328   PROT 7.1 01/09/2021 1328   PROT 6.4 02/04/2020 1036   ALBUMIN 3.7 01/09/2021 1328   ALBUMIN 4.2 02/04/2020 1036   AST 18 01/09/2021 1328   ALT 15 01/09/2021 1328   ALKPHOS 59 01/09/2021 1328   BILITOT 0.7 01/09/2021 1328   GFRNONAA >60 01/09/2021 1328   GFRAA 104 12/20/2019 0829    No results found for: SPEP, UPEP  Lab Results  Component Value Date   WBC 5.4 01/09/2021   NEUTROABS 3.6 01/09/2021   HGB 13.5 01/09/2021   HCT 42.1 01/09/2021   MCV 88.3 01/09/2021   PLT 271 01/09/2021      Chemistry      Component Value Date/Time   NA 141 01/09/2021 1328   NA 143 12/20/2019 0829   K 4.1 01/09/2021 1328   CL 103 01/09/2021 1328   CO2 28 01/09/2021 1328   BUN 9 01/09/2021 1328   BUN 16 12/20/2019 0829   CREATININE 0.67 01/09/2021 1328      Component Value Date/Time   CALCIUM  9.2 01/09/2021 1328   ALKPHOS 59 01/09/2021 1328   AST 18 01/09/2021 1328   ALT 15 01/09/2021 1328   BILITOT 0.7 01/09/2021 1328

## 2021-02-03 ENCOUNTER — Other Ambulatory Visit: Payer: Self-pay

## 2021-02-03 ENCOUNTER — Emergency Department (HOSPITAL_COMMUNITY)
Admission: EM | Admit: 2021-02-03 | Discharge: 2021-02-04 | Disposition: A | Discharge status: Home / Self Care | Payer: Medicare Other | Attending: Emergency Medicine | Admitting: Emergency Medicine

## 2021-02-03 ENCOUNTER — Ambulatory Visit: Payer: Self-pay

## 2021-02-03 ENCOUNTER — Emergency Department: Admission: EM | Admit: 2021-02-03 | Discharge: 2021-02-03 | Discharge status: Against Medical Advice | Payer: Self-pay

## 2021-02-03 ENCOUNTER — Telehealth: Payer: Self-pay | Admitting: *Deleted

## 2021-02-03 ENCOUNTER — Encounter (HOSPITAL_COMMUNITY): Payer: Self-pay

## 2021-02-03 DIAGNOSIS — Z853 Personal history of malignant neoplasm of breast: Secondary | ICD-10-CM | POA: Diagnosis not present

## 2021-02-03 DIAGNOSIS — J45909 Unspecified asthma, uncomplicated: Secondary | ICD-10-CM | POA: Diagnosis not present

## 2021-02-03 DIAGNOSIS — Z452 Encounter for adjustment and management of vascular access device: Secondary | ICD-10-CM | POA: Diagnosis present

## 2021-02-03 DIAGNOSIS — I10 Essential (primary) hypertension: Secondary | ICD-10-CM | POA: Diagnosis not present

## 2021-02-03 LAB — COMPREHENSIVE METABOLIC PANEL
ALT: 20 U/L (ref 0–44)
AST: 21 U/L (ref 15–41)
Albumin: 4 g/dL (ref 3.5–5.0)
Alkaline Phosphatase: 63 U/L (ref 38–126)
Anion gap: 7 (ref 5–15)
BUN: 16 mg/dL (ref 8–23)
CO2: 29 mmol/L (ref 22–32)
Calcium: 9.1 mg/dL (ref 8.9–10.3)
Chloride: 105 mmol/L (ref 98–111)
Creatinine, Ser: 0.58 mg/dL (ref 0.44–1.00)
GFR, Estimated: >60 mL/min (ref >60)
Glucose, Bld: 101 mg/dL — ABNORMAL HIGH (ref 70–99)
Potassium: 4.3 mmol/L (ref 3.5–5.1)
Sodium: 141 mmol/L (ref 135–145)
Total Bilirubin: 0.3 mg/dL (ref 0.3–1.2)
Total Protein: 7 g/dL (ref 6.5–8.1)

## 2021-02-03 LAB — CBC WITH DIFFERENTIAL/PLATELET
Abs Immature Granulocytes: 0.11 10*3/uL — ABNORMAL HIGH (ref 0.00–0.07)
Basophils Absolute: 0 10*3/uL (ref 0.0–0.1)
Basophils Relative: 1 %
Eosinophils Absolute: 0.2 10*3/uL (ref 0.0–0.5)
Eosinophils Relative: 5 %
HCT: 39.9 % (ref 36.0–46.0)
Hemoglobin: 12.7 g/dL (ref 12.0–15.0)
Immature Granulocytes: 2 %
Lymphocytes Relative: 34 %
Lymphs Abs: 1.6 10*3/uL (ref 0.7–4.0)
MCH: 28.9 pg (ref 26.0–34.0)
MCHC: 31.8 g/dL (ref 30.0–36.0)
MCV: 90.7 fL (ref 80.0–100.0)
Monocytes Absolute: 0.5 10*3/uL (ref 0.1–1.0)
Monocytes Relative: 11 %
Neutro Abs: 2.2 10*3/uL (ref 1.7–7.7)
Neutrophils Relative %: 47 %
Platelets: 152 10*3/uL (ref 150–400)
RBC: 4.4 MIL/uL (ref 3.87–5.11)
RDW: 15 % (ref 11.5–15.5)
WBC: 4.7 10*3/uL (ref 4.0–10.5)
nRBC: 0 % (ref 0.0–0.2)

## 2021-02-03 MED ORDER — ACETAMINOPHEN 500 MG PO TABS
1000.0000 mg | ORAL_TABLET | Freq: Once | ORAL | Status: AC
  Administered 2021-02-03: 1000 mg via ORAL
  Filled 2021-02-03: qty 2

## 2021-02-03 NOTE — ED Provider Notes (Signed)
Emergency Medicine Provider Triage Evaluation Note  Brittany Middleton , a 70 y.o. female  was evaluated in triage.  Pt here with concerns in regards to her port.  She states that she is currently receiving infusions of meropenem for a UTI that she was recently hospitalized for, when her bandage was changed yesterday the nursing staff was concerned it looked a little bit red, this seemed worse this morning with some development of purulence.  Her oncologist is out of office.  Review of Systems  Positive: Redness, drainage Negative: Fever, vomiting, chills  Physical Exam  BP (!) 163/74 (BP Location: Left Arm)   Pulse 65   Temp 97.8 F (36.6 C) (Oral)   Resp 18   SpO2 97%  Gen:   Awake, no distress   Resp:  Normal effort  MSK:   Moves extremities without difficulty  Other:     Medical Decision Making  Medically screening exam initiated at 5:08 PM.  Appropriate orders placed.  Brittany Middleton was informed that the remainder of the evaluation will be completed by another provider, this initial triage assessment does not replace that evaluation, and the importance of remaining in the ED until their evaluation is complete.     Brittany Middleton 02/03/21 1710    Charlesetta Shanks, MD 02/22/21 1558

## 2021-02-03 NOTE — ED Notes (Signed)
Pt has been concerned regarding wait times and expressing that she is observing the site around her port "Getting bigger" and that she can "Feel the infection spreading through my port."

## 2021-02-03 NOTE — ED Notes (Signed)
Pt ambulatory in ED lobby. 

## 2021-02-03 NOTE — Telephone Encounter (Addendum)
Patient contacted office - LVM stating she had a situation with her port. The incision area is irritated and she is concerned she might have an infection.  Contacted patient, LVM encouraging patient to seek care if increased redness, drainage or increase in her temp -- and to call surgeon's office.   Contacted patient again and reached patient. Gave same information as in VM and also encouraged patient to contact surgeon's office in AM.  Patient states she is in [facility] ED at this time to have site evaluated.   Call routed to Dr. Sherlynn Stalls

## 2021-02-03 NOTE — Telephone Encounter (Signed)
Patient called and says she has a port a cath that she's receiving antibiotics and chemo through. She says yesterday evening her home health nurse came and changed the dressing on the site. She says the nurse tore the tape off and the area where the tape came off is red and has gotten more red as the day has gone one. She says there is also a white bump under the dressing that looks like a pus pocket. She says she's sitting in the UC right now and they told her it's nothing they can do. She says she called the home health nurse who advised UC. She says she called her doctor at the [facility] and he's on vacation, the nurse is not there today, so she left a message for a nurse to call back, but no response yet. I advised to go to [facility] ED for evaluation and continue to call the [facility] to speak to a nurse, care advice given, patient verbalized understanding.  Reason for Disposition  [1] Skin redness AND [2] extends > 1 inch (2.5 cm) from IV site  Answer Assessment - Initial Assessment Questions 1. SYMPTOM:  "What's the main symptom you're concerned about?" (e.g., pain, redness, swelling, pus)     Redness to the area, white area that looks like a pus pocket 2. ONSET: "When did the redness start?"      I noticed it last night, has gotten worse as the day went on 3. IV TYPE: "What kind of IV line do you have?" (e.g., central line, PICC, peripheral IV)     Central line port a cath 4. IV LOCATION - SITE: "Where does the IV enter your body?"     Right chest 5. IV START DATE: "When was this IV put in?"     01/10/21 6. IV REASON: "Why do you have this IV line?"     Port a cath for antibiotics and chemotherapy 7. IV FUNCTION: "Describe how the IV is running?" (e.g., running normally, running slowly, not running, unable to flush)      Running normal 8. PAIN: "Is there any pain?" If Yes, ask: "How bad is the pain?" (e.g., scale 1-10; or mild, moderate, severe) "Describe the pain." (e.g., burning,  throbbing, shooting, sharp, etc.)   - NONE (0): no pain   - MILD (1-3): doesn't interfere with normal activities    - MODERATE (4-7): interferes with normal activities or awakens from sleep    - SEVERE (8-10): excruciating pain, unable to do any normal activities      Mild discomfort 9. SWELLING: "Is there any swelling at your IV site?"      No 10. FEVER: "Do you have a fever?" If Yes, ask: "What is your temperature, how was it measured, and when did it start?"        No 11. OTHER SYMPTOMS: "Do you have any other symptoms?" (e.g., shaking chills, weakness)       No 12. VISITING NURSE: "Do you have a visiting nurse?" (e.g., home health nurse, IV infusion nurse)       Yes, I called and was told to call my doctor 13. PUMP: "Is it on a pump?" If Yes,, ask: "Is there an alarm and what is the message?"       No  Protocols used: IV Site and Other Symptoms-A-AH

## 2021-02-03 NOTE — ED Triage Notes (Signed)
Pt reports having a port for chemo and had a dressing changed yesterday. The incision site is now red and had pus at the site.

## 2021-02-03 NOTE — ED Notes (Addendum)
MD at bedside. Patient in room, in gown.

## 2021-02-04 MED ORDER — HEPARIN SOD (PORK) LOCK FLUSH 100 UNIT/ML IV SOLN
500.0000 [IU] | Freq: Once | INTRAVENOUS | Status: AC
  Administered 2021-02-04: 500 [IU]
  Filled 2021-02-04: qty 5

## 2021-02-04 NOTE — ED Provider Notes (Signed)
[facility] Emergency Department Provider Note MRN:  [id number]  Arrival date & time: 02/04/21     Chief Complaint   Port Infection   History of Present Illness   Brittany Middleton is a 70 y.o. year-old female with a history of uterine cancer presenting to the ED with chief complaint of port infection.  Patient is noticed redness and a area of white purulence just above her new port.  Was placed about a month ago.  Area is tender, warm.  Denies fever, no nausea vomiting, no chest pain or shortness of breath, no abdominal pain, no other complaints.  Symptoms mild, constant, no exacerbating or alleviating factors.  Review of Systems  A complete 10 system review of systems was obtained and all systems are negative except as noted in the HPI and PMH.   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   Family history of breast cancer 01/27/2021   Gastrointestinal bleeding 07/25/2018   Gastrointestinal hemorrhage with melena 07/16/2018   GERD (gastroesophageal reflux disease)    Hepatic steatosis    per imaging 2016, 2019   Hiatal hernia    History of palpitations    Holter monitor, 2017: NSR, occasional PVC, PACs, no arrhythmia   HLD (hyperlipidemia) 02/27/2018   HTN (hypertension) 02/27/2018   Hypercholesteremia    Hypercholesterolemia 04/07/2017   Hypertension    Hypertensive disorder 04/07/2017   Hypokalemia 07/17/2018   Lumbar disc disease    MRI, 2017    Morbid obesity ([place]) 05/22/2018   Overweight 03/03/2018   Palpitations 08/19/2015   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[place])    Pneumonia    Pyelonephritis 03/30/2020   Sepsis secondary to UTI ([place]) 04/25/2020    Severe sepsis with acute organ dysfunction ([place]) 04/25/2020   Sleep apnea    UTI (urinary tract infection)     Past Surgical History:  Procedure Laterality Date   BLADDER REPAIR     BREAST EXCISIONAL BIOPSY Left    CARPAL TUNNEL RELEASE Right    CATARACT EXTRACTION, BILATERAL  2014   COLONOSCOPY WITH PROPOFOL N/A 07/18/2018   Procedure: COLONOSCOPY WITH PROPOFOL;  Surgeon: Laurence Spates, MD;  Location: [facility];  Service: Endoscopy;  Laterality: N/A;   ESOPHAGOGASTRODUODENOSCOPY (EGD) WITH PROPOFOL N/A 07/17/2018   Procedure: ESOPHAGOGASTRODUODENOSCOPY (EGD) WITH PROPOFOL;  Surgeon: Laurence Spates, MD;  Location: [facility];  Service: Endoscopy;  Laterality: N/A;   LAPAROSCOPY N/A 12/30/2020   Procedure: LAPAROSCOPY DIAGNOSTIC; WITH  BIOPSY OF ABDOMINAL WALL NODULES AND BIOPSY OF PERICOLONIC EXUDATE;  Surgeon: Kinsinger, Arta Bruce, MD;  Location: [facility];  Service: General;  Laterality: N/A;   PORTACATH PLACEMENT N/A 01/09/2021   Procedure: PORT INSERTION WITH [place] & FLUORO;  Surgeon: Kieth Brightly Arta Bruce, MD;  Location: [facility];  Service: General;  Laterality: N/A;   REPAIR RECTOCELE     TOE SURGERY Left     Family History  Problem Relation Age of Onset   CAD Mother    Breast cancer Mother 51   Lung cancer Father 45   Cancer Paternal 76        GYN cancer? bladder? d. early 69s   Cancer  Paternal Aunt 86       GYN cancer   Lung cancer Paternal Grandfather        d. 35; smoking hx    Social History   Socioeconomic History   Marital status: Divorced    Spouse name: Not on file   Number of children: 2   Years of education: Not on file   Highest education level: Master's degree (e.g., MA, MS, MEng, MEd, MSW, MBA)  Occupational History   Occupation: Retired  Tobacco Use   Smoking status: Never   Smokeless tobacco: Never  Vaping Use   Vaping Use: Never used  Substance and Sexual Activity   Alcohol use: No   Drug use: No   Sexual activity: Not on file  Other Topics  Concern   Not on file  Social History Narrative   Not on file   Social Determinants of Health   Financial Resource Strain: Not on file  Food Insecurity: Not on file  Transportation Needs: Not on file  Physical Activity: Not on file  Stress: Not on file  Social Connections: Not on file  Intimate Partner Violence: Not on file     Physical Exam   Vitals:   02/04/21 0010 02/04/21 0020  BP:  (!) 144/72  Pulse: (!) 58 64  Resp:  18  Temp:    SpO2: 97% 99%    CONSTITUTIONAL: Well-appearing, NAD NEURO:  Alert and oriented x 3, no focal deficits EYES:  eyes equal and reactive ENT/NECK:  no LAD, no JVD CARDIO: Regular rate, well-perfused, normal S1 and S2 PULM:  CTAB no wheezing or rhonchi GI/GU:  normal bowel sounds, non-distended, non-tender MSK/SPINE:  No gross deformities, no edema SKIN:  no rash, atraumatic PSYCH:  Appropriate speech and behavior Media Information Document Information  Photos    02/03/2021 23:06  Attached To:  Hospital Encounter on 02/03/21   Source Information  Ivalee Strauser, Barth Kirks, MD  [facility]   *Additional and/or pertinent findings included in MDM below  Diagnostic and Interventional Summary    EKG Interpretation  Date/Time:    Ventricular Rate:    PR Interval:    QRS Duration:   QT Interval:    QTC Calculation:   R Axis:     Text Interpretation:         Labs Reviewed  COMPREHENSIVE METABOLIC PANEL - Abnormal; Notable for the following components:      Result Value   Glucose, Bld 101 (*)    All other components within normal limits  CBC WITH DIFFERENTIAL/PLATELET - Abnormal; Notable for the following components:   Abs Immature Granulocytes 0.11 (*)    All other components within normal limits  CULTURE, BLOOD (SINGLE)  CULTURE, BLOOD (SINGLE)    No orders to display    Medications  heparin lock flush 100 unit/mL (has no administration in time range)  acetaminophen (TYLENOL) tablet 1,000 mg (1,000 mg Oral Given  02/03/21 2332)     Procedures  /  Critical Care Procedures  ED Course and Medical Decision Making  I have reviewed the triage vital signs, the nursing notes, and pertinent available records from the EMR.  Listed above are laboratory and imaging tests that I personally ordered, reviewed, and interpreted and then considered in my medical decision making (see below for details).  Erythema and purulence near the incision site of recently placed port.  Placed by Dr. Alvino Blood on 7/22.  No systemic symptoms.  Will consult general surgery for recommendations regarding management.  Discussed case with Dr. Barry Dienes, nothing to do at this time, can be seen soon in the office.  No need for any further antibiotics as patient is on meropenem.  Could be related to irritation from the tape or possibly a minor skin infection.  Patient is given strict return precautions for worsening redness, pain, fever.  Blood cultures being taken from the port and peripherally per general surgery recommendations.  Appropriate for discharge.  Barth Kirks. Sedonia Small, [place] [email or web address]  Final Clinical Impressions(s) / ED Diagnoses     ICD-10-CM   1. Encounter for care related to Port-a-Cath  Z45.2       ED Discharge Orders     None        Discharge Instructions Discussed with and Provided to Patient:     Discharge Instructions      You were evaluated in the Emergency Department and after careful evaluation, we did not find any emergent condition requiring admission or further testing in the hospital.  Your exam/testing today was overall reassuring.  Your symptoms may be related to a minor skin infection or irritation from the tape near your Port-A-Cath.  We discussed your case with the surgeons, and they will contact you for close follow-up in the office.  Resume normal care of the Port-A-Cath site as directed.  Please return to the Emergency  Department if you experience any worsening of your condition.  Thank you for allowing [place] to be a part of your care.         Maudie Flakes, MD 02/04/21 [phone]

## 2021-02-04 NOTE — Discharge Instructions (Addendum)
You were evaluated in the Emergency Department and after careful evaluation, we did not find any emergent condition requiring admission or further testing in the hospital.  Your exam/testing today was overall reassuring.  Your symptoms may be related to a minor skin infection or irritation from the tape near your Port-A-Cath.  We discussed your case with the surgeons, and they will contact you for close follow-up in the office.  Resume normal care of the Port-A-Cath site as directed.  Please return to the Emergency Department if you experience any worsening of your condition.  Thank you for allowing [place] to be a part of your care.

## 2021-02-05 ENCOUNTER — Inpatient Hospital Stay: Payer: Medicare Other

## 2021-02-05 ENCOUNTER — Ambulatory Visit: Payer: Medicare Other

## 2021-02-05 ENCOUNTER — Inpatient Hospital Stay (HOSPITAL_BASED_OUTPATIENT_CLINIC_OR_DEPARTMENT_OTHER): Payer: Medicare Other | Admitting: Hematology and Oncology

## 2021-02-05 ENCOUNTER — Telehealth: Payer: Self-pay

## 2021-02-05 ENCOUNTER — Other Ambulatory Visit: Payer: Medicare Other

## 2021-02-05 ENCOUNTER — Other Ambulatory Visit: Payer: Self-pay

## 2021-02-05 ENCOUNTER — Encounter: Payer: Self-pay | Admitting: Hematology and Oncology

## 2021-02-05 DIAGNOSIS — A419 Sepsis, unspecified organism: Secondary | ICD-10-CM

## 2021-02-05 DIAGNOSIS — C482 Malignant neoplasm of peritoneum, unspecified: Secondary | ICD-10-CM | POA: Diagnosis not present

## 2021-02-05 DIAGNOSIS — Z1612 Extended spectrum beta lactamase (ESBL) resistance: Secondary | ICD-10-CM

## 2021-02-05 DIAGNOSIS — N390 Urinary tract infection, site not specified: Secondary | ICD-10-CM | POA: Diagnosis not present

## 2021-02-05 DIAGNOSIS — L089 Local infection of the skin and subcutaneous tissue, unspecified: Secondary | ICD-10-CM

## 2021-02-05 DIAGNOSIS — B9629 Other Escherichia coli [E. coli] as the cause of diseases classified elsewhere: Secondary | ICD-10-CM

## 2021-02-05 DIAGNOSIS — C786 Secondary malignant neoplasm of retroperitoneum and peritoneum: Secondary | ICD-10-CM

## 2021-02-05 DIAGNOSIS — R238 Other skin changes: Secondary | ICD-10-CM

## 2021-02-05 DIAGNOSIS — C762 Malignant neoplasm of abdomen: Secondary | ICD-10-CM

## 2021-02-05 HISTORY — DX: Local infection of the skin and subcutaneous tissue, unspecified: L08.9

## 2021-02-05 HISTORY — DX: Other skin changes: R23.8

## 2021-02-05 LAB — CBC WITH DIFFERENTIAL (CANCER CENTER ONLY)
Abs Immature Granulocytes: 0.03 10*3/uL (ref 0.00–0.07)
Basophils Absolute: 0.1 10*3/uL (ref 0.0–0.1)
Basophils Relative: 1 %
Eosinophils Absolute: 0.2 10*3/uL (ref 0.0–0.5)
Eosinophils Relative: 4 %
HCT: 39 % (ref 36.0–46.0)
Hemoglobin: 12.5 g/dL (ref 12.0–15.0)
Immature Granulocytes: 1 %
Lymphocytes Relative: 25 %
Lymphs Abs: 1.4 10*3/uL (ref 0.7–4.0)
MCH: 28.8 pg (ref 26.0–34.0)
MCHC: 32.1 g/dL (ref 30.0–36.0)
MCV: 89.9 fL (ref 80.0–100.0)
Monocytes Absolute: 0.4 10*3/uL (ref 0.1–1.0)
Monocytes Relative: 7 %
Neutro Abs: 3.5 10*3/uL (ref 1.7–7.7)
Neutrophils Relative %: 62 %
Platelet Count: 163 10*3/uL (ref 150–400)
RBC: 4.34 MIL/uL (ref 3.87–5.11)
RDW: 15 % (ref 11.5–15.5)
WBC Count: 5.5 10*3/uL (ref 4.0–10.5)
nRBC: 0 % (ref 0.0–0.2)

## 2021-02-05 LAB — URINALYSIS, COMPLETE (UACMP) WITH MICROSCOPIC
Bacteria, UA: NONE SEEN
Bilirubin Urine: NEGATIVE
Glucose, UA: NEGATIVE mg/dL
Ketones, ur: NEGATIVE mg/dL
Leukocytes,Ua: NEGATIVE
Nitrite: NEGATIVE
Protein, ur: NEGATIVE mg/dL
Specific Gravity, Urine: 1.005 (ref 1.005–1.030)
pH: 6 (ref 5.0–8.0)

## 2021-02-05 LAB — CMP (CANCER CENTER ONLY)
ALT: 18 U/L (ref 0–44)
AST: 20 U/L (ref 15–41)
Albumin: 3.9 g/dL (ref 3.5–5.0)
Alkaline Phosphatase: 70 U/L (ref 38–126)
Anion gap: 9 (ref 5–15)
BUN: 12 mg/dL (ref 8–23)
CO2: 28 mmol/L (ref 22–32)
Calcium: 9.3 mg/dL (ref 8.9–10.3)
Chloride: 105 mmol/L (ref 98–111)
Creatinine: 0.7 mg/dL (ref 0.44–1.00)
GFR, Estimated: >60 mL/min (ref >60)
Glucose, Bld: 98 mg/dL (ref 70–99)
Potassium: 4.4 mmol/L (ref 3.5–5.1)
Sodium: 142 mmol/L (ref 135–145)
Total Bilirubin: 0.5 mg/dL (ref 0.3–1.2)
Total Protein: 7 g/dL (ref 6.5–8.1)

## 2021-02-05 LAB — GENETIC SCREENING ORDER

## 2021-02-05 MED ORDER — SULFAMETHOXAZOLE-TRIMETHOPRIM 800-160 MG PO TABS: 1.0000 | ORAL_TABLET | Freq: Two times a day (BID) | ORAL | 0 refills | Status: DC

## 2021-02-05 NOTE — Progress Notes (Signed)
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[facility]) Clinically, she is responding to treatment Her treatment course is complicated by recent infection Continue aggressive supportive care She will get repeat imaging study after 3 cycles of chemo  Skin infection I am concerned about possible skin infection despite removal of the port needle I will cover her with a couple days of Bactrim and reassess next week  Urinary tract infection due to extended-spectrum beta lactamase (ESBL) producing Escherichia coli She has completed a course of antibiotics I will repeat urinalysis and urine culture  Skin irritation She has skin sensitivity reaction to adhesive tapes We will put a note in her chart so that she can get special dressing over the port in the future  No orders of the defined types were placed in this encounter.   All questions were answered. The patient knows to call the clinic with any problems, questions or concerns. The total time spent in the appointment was 20 minutes encounter with patients including review of chart and various tests results, discussions about plan of care and coordination of care plan   [name], MD 02/05/2021 4:41 PM  INTERVAL HISTORY: Please see below for problem oriented charting. 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 - Diverticulosis in the sigmoid colon and in the descending colon. - No specimens collected. - Blood in stool without cause found on endoscopic exam   05/08/2020 Imaging   1. Bladder is decompressed though demonstrates significant perivesicular hazy stranding, mucosal hyperemia and edematous mural thickening. Findings are suggestive of cystitis. Correlate with urinalysis. No abnormal perinephric or periureteral stranding or other features to suggest an ascending tract infection at this time. 2. Colonic diverticulosis without evidence of acute diverticulitis. 3. Aortic Atherosclerosis (ICD10-I70.0).   12/28/2020 Imaging   1. 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 Differential for peritoneal carcinomatosis is primarily neoplastic and includes cancers of the ovary, appendix, colon, pancreas and stomach. PET-CT may be helpful for further characterization. Tissue sampling of the mesentery may be eventually required for diagnosis. 2. Mildly distended small bowel loops throughout the abdomen and pelvis, with fluid and associated air-fluid levels throughout the nondistended small bowel, suggesting a mild ileus versus partial small bowel obstruction. Favor partial small bowel obstruction with transition zone in the RIGHT lower quadrant, likely related to aforementioned neoplastic peritoneal implants and/or adhesions. 3. Small free fluid within the RIGHT upper quadrant and LEFT upper quadrant.  No abscess collection  seen. No free intraperitoneal air seen. 4. Extensive colonic diverticulosis without evidence of acute diverticulitis. 5. Small chronic pleural effusions with associated atelectasis.   12/29/2020 Tumor Marker   Patient's tumor was tested for the following markers: CA-125. Results of the tumor marker test revealed 95.1.   12/30/2020 Surgery   Preoperative diagnosis: abdominal nodules   Postoperative diagnosis: same   Procedure: diagnostic laparoscopy with abdominal wall biopsy   Surgeon: Gurney Maxin, M.D.    Indications for procedure: Eldred Lievanos is a 70 y.o. year old female with symptoms of nausea, vomiting, and abdominal pain. Work up was concerning for cancer with abdominal wall studding.   Description of procedure: The patient was brought into the operative suite. Anesthesia was administered with General endotracheal anesthesia. WHO checklist was applied. The patient was then placed in supine position. The area was prepped and draped in the usual sterile fashion.   A small left subcostal incision was made. A 56m trocar was used to gain access to the peritoneal cavity by optical entry technique. Pneumoperitoneum was applied with a high flow and low pressure. The laparoscope was reinserted to confirm position.   On initial visualization of the abdomen, there were multiple nodules throughout the abdominal wall. There were multiple nodules over the small intestine and mesentery. There was some loops of small intestine that were matted together. There was a large white mass in the left upper quadrant. Multiple areas of peritoneum were removed with harmonic scalpel. The large mass was inspected. It appeared to be related to the colon and omentum. Grasper was used to remove some superficial tissue and was sent for culture.   The abdomen was desufflated. The incisions were closed with 4-0 monocryl subcuticular stitch.   12/30/2020 Pathology Results   FINAL  MICROSCOPIC DIAGNOSIS:   A. ABDOMINAL WALL, NODULE, EXCISION:  -  Metastatic adenocarcinoma  -  See comment   B. PERI COLONIC EXUDATE, EXCISION:  -  Metastatic adenocarcinoma  -  See comment   COMMENT:   By immunohistochemistry, the neoplastic cells are positive for cytokeratin 7, PAX8 and WT1 but negative for cytokeratin 20, CDX2, p53 and TTF-1.  The morphology and immunophenotype are consistent with a gynecologic primary.     01/01/2021 Initial Diagnosis   Peritoneal carcinomatosis ([facility]   01/08/2021 Cancer Staging   Staging form: Ovary, Fallopian Tube, and Primary Peritoneal Carcinoma, AJCC 8th Edition - Clinical stage from 01/08/2021: cT3, cN0, cM0 - Signed by GHeath Lark MD on 01/08/2021 Stage prefix: Initial diagnosis   01/12/2021 -  Chemotherapy    Patient is on Treatment Plan: OVARIAN CARBOPLATIN (AUC 6) / PACLITAXEL (175) Q21D X 6 CYCLES       01/17/2021 Imaging   MRI brain No evidence of intracranial metastatic disease or other acute abnormality     REVIEW OF SYSTEMS:   Constitutional: Denies fevers, chills or abnormal weight loss Eyes: Denies blurriness of vision Ears, nose, mouth, throat, and face: Denies mucositis or sore throat Respiratory: Denies cough, dyspnea or wheezes Cardiovascular: Denies palpitation, chest discomfort or lower extremity swelling Gastrointestinal:  Denies nausea, heartburn or change in bowel habits Lymphatics: Denies new lymphadenopathy or easy bruising Neurological:Denies numbness, tingling or new weaknesses Behavioral/Psych: Mood is stable, no new changes  All other systems were reviewed with the patient and are negative.  I have reviewed the past medical history, past surgical history, social history and family history with the patient and they are unchanged from previous note.  ALLERGIES:  is allergic to macrobid [  nitrofurantoin], ciprofloxacin, codeine, and azithromycin.  MEDICATIONS:  Current Outpatient Medications  Medication  Sig Dispense Refill   sulfamethoxazole-trimethoprim (BACTRIM DS) 800-160 MG tablet Take 1 tablet by mouth 2 (two) times daily. 10 tablet 0   acetaminophen (TYLENOL) 500 MG tablet Take 1,000 mg by mouth every 6 (six) hours as needed for mild pain, moderate pain, fever or headache.     albuterol (PROVENTIL) (2.5 MG/3ML) 0.083% nebulizer solution Take 2.5 mg by nebulization every 6 (six) hours as needed for wheezing or shortness of breath.     albuterol (VENTOLIN HFA) 108 (90 Base) MCG/ACT inhaler Inhale 2 puffs into the lungs every 6 (six) hours as needed for wheezing or shortness of breath. 8 g 2   alclomethasone (ACLOVATE) 0.05 % ointment Apply 1 application topically 2 (two) times daily.     BIOTIN PO Take 1 tablet by mouth daily.     Cholecalciferol (VITAMIN D-3) 25 MCG (1000 UT) CAPS Take 1,000 Units by mouth daily.     clopidogrel (PLAVIX) 75 MG tablet Take 1 tablet (75 mg total) by mouth daily. 90 tablet 3   co-enzyme Q-10 30 MG capsule Take 30 mg by mouth daily.     dexamethasone (DECADRON) 4 MG tablet Take 2 tabs at the night before and 2 tab the morning of chemotherapy, every 3 weeks, by mouth x 6 cycles 36 tablet 6   estradiol (ESTRACE) 0.1 MG/GM vaginal cream Place 1 Applicatorful vaginally 3 (three) times a week.     fluticasone (FLONASE) 50 MCG/ACT nasal spray Place 1 spray into both nostrils daily. 1 g 2   fluticasone (FLOVENT HFA) 110 MCG/ACT inhaler Inhale 2 puffs into the lungs 2 (two) times daily. 1 each 6   hydrocortisone 2.5 % cream Apply 1 application topically daily as needed (forehead).      lidocaine-prilocaine (EMLA) cream Apply to affected area once 30 g 3   losartan (COZAAR) 50 MG tablet Take 1 tablet (50 mg total) by mouth daily. 30 tablet 2   Melatonin 5 MG CHEW Chew 5 mg by mouth at bedtime.      Multiple Vitamins-Minerals (MULTIVITAMIN WITH MINERALS) tablet Take 1 tablet by mouth daily.     omeprazole (PRILOSEC) 40 MG capsule Take 40 mg by mouth daily.      ondansetron (ZOFRAN ODT) 4 MG disintegrating tablet Take 1 tablet (4 mg total) by mouth every 8 (eight) hours as needed for nausea or vomiting. 20 tablet 0   ondansetron (ZOFRAN) 8 MG tablet Take 1 tablet (8 mg total) by mouth every 8 (eight) hours as needed for refractory nausea / vomiting. 30 tablet 1   pantoprazole (PROTONIX) 40 MG tablet Take 1 tablet (40 mg total) by mouth daily. 30 tablet 1   polyethylene glycol (MIRALAX / GLYCOLAX) 17 g packet Take 8.5 g by mouth 2 (two) times daily.     PRESCRIPTION MEDICATION Inhale into the lungs at bedtime. CPAP     prochlorperazine (COMPAZINE) 10 MG tablet Take 1 tablet (10 mg total) by mouth every 6 (six) hours as needed (Nausea or vomiting). 30 tablet 1   rosuvastatin (CRESTOR) 20 MG tablet TAKE ONE TABLET BY MOUTH ONE TIME DAILY 90 tablet 0   traMADol (ULTRAM) 50 MG tablet Take 2 tablets (100 mg total) by mouth every 6 (six) hours as needed. 60 tablet 0   No current facility-administered medications for this visit.    PHYSICAL EXAMINATION: ECOG PERFORMANCE STATUS: 1 - Symptomatic but completely ambulatory  Vitals:  02/05/21 1349  BP: 126/65  Pulse: 72  Resp: 18  SpO2: 98%   Filed Weights   02/05/21 1349  Weight: 241 lb 9.6 oz (109.6 kg)    GENERAL:alert, no distress and comfortable SKIN: Noted erythematous skin changes around the port site NEURO: alert & oriented x 3 with fluent speech, no focal motor/sensory deficits  LABORATORY DATA:  I have reviewed the data as listed    Component Value Date/Time   NA 142 02/05/2021 1415   NA 143 12/20/2019 0829   K 4.4 02/05/2021 1415   CL 105 02/05/2021 1415   CO2 28 02/05/2021 1415   GLUCOSE 98 02/05/2021 1415   BUN 12 02/05/2021 1415   BUN 16 12/20/2019 0829   CREATININE 0.70 02/05/2021 1415   CALCIUM 9.3 02/05/2021 1415   PROT 7.0 02/05/2021 1415   PROT 6.4 02/04/2020 1036   ALBUMIN 3.9 02/05/2021 1415   ALBUMIN 4.2 02/04/2020 1036   AST 20 02/05/2021 1415   ALT 18 02/05/2021  1415   ALKPHOS 70 02/05/2021 1415   BILITOT 0.5 02/05/2021 1415   GFRNONAA >60 02/05/2021 1415   GFRAA 104 12/20/2019 0829    No results found for: SPEP, UPEP  Lab Results  Component Value Date   WBC 5.5 02/05/2021   NEUTROABS 3.5 02/05/2021   HGB 12.5 02/05/2021   HCT 39.0 02/05/2021   MCV 89.9 02/05/2021   PLT 163 02/05/2021      Chemistry      Component Value Date/Time   NA 142 02/05/2021 1415   NA 143 12/20/2019 0829   K 4.4 02/05/2021 1415   CL 105 02/05/2021 1415   CO2 28 02/05/2021 1415   BUN 12 02/05/2021 1415   BUN 16 12/20/2019 0829   CREATININE 0.70 02/05/2021 1415      Component Value Date/Time   CALCIUM 9.3 02/05/2021 1415   ALKPHOS 70 02/05/2021 1415   AST 20 02/05/2021 1415   ALT 18 02/05/2021 1415   BILITOT 0.5 02/05/2021 1415

## 2021-02-05 NOTE — Assessment & Plan Note (Signed)
Clinically, she is responding to treatment Her treatment course is complicated by recent infection Continue aggressive supportive care She will get repeat imaging study after 3 cycles of chemo

## 2021-02-05 NOTE — Telephone Encounter (Signed)
Called and scheduled 1:45 pm for today with Dr. [name]. She is aware of appt.

## 2021-02-05 NOTE — Assessment & Plan Note (Signed)
I am concerned about possible skin infection despite removal of the port needle I will cover her with a couple days of Bactrim and reassess next week

## 2021-02-05 NOTE — Assessment & Plan Note (Signed)
She has completed a course of antibiotics I will repeat urinalysis and urine culture

## 2021-02-05 NOTE — Assessment & Plan Note (Signed)
She has skin sensitivity reaction to adhesive tapes We will put a note in her chart so that she can get special dressing over the port in the future

## 2021-02-07 LAB — URINE CULTURE: Culture: NO GROWTH

## 2021-02-09 ENCOUNTER — Inpatient Hospital Stay: Payer: Medicare Other

## 2021-02-09 ENCOUNTER — Other Ambulatory Visit: Payer: Self-pay

## 2021-02-09 ENCOUNTER — Inpatient Hospital Stay (HOSPITAL_BASED_OUTPATIENT_CLINIC_OR_DEPARTMENT_OTHER): Payer: Medicare Other | Admitting: Hematology and Oncology

## 2021-02-09 ENCOUNTER — Encounter: Payer: Self-pay | Admitting: Hematology and Oncology

## 2021-02-09 VITALS — BP 126/57 | HR 76 | Temp 98.4°F | Resp 16 | Wt 242.2 lb

## 2021-02-09 DIAGNOSIS — C762 Malignant neoplasm of abdomen: Secondary | ICD-10-CM

## 2021-02-09 DIAGNOSIS — C786 Secondary malignant neoplasm of retroperitoneum and peritoneum: Secondary | ICD-10-CM

## 2021-02-09 DIAGNOSIS — C482 Malignant neoplasm of peritoneum, unspecified: Secondary | ICD-10-CM | POA: Diagnosis not present

## 2021-02-09 LAB — CULTURE, BLOOD (SINGLE)
Culture: NO GROWTH
Culture: NO GROWTH
Special Requests: ADEQUATE
Special Requests: ADEQUATE

## 2021-02-09 MED ORDER — SODIUM CHLORIDE 0.9 % IV SOLN
175.0000 mg/m2 | Freq: Once | INTRAVENOUS | Status: AC
  Administered 2021-02-09: 330 mg via INTRAVENOUS
  Filled 2021-02-09: qty 55

## 2021-02-09 MED ORDER — COLD PACK MISC ONCOLOGY
1.0000 | Freq: Once | Status: AC | PRN
  Administered 2021-02-09: 1 via TOPICAL

## 2021-02-09 MED ORDER — SODIUM CHLORIDE 0.9% FLUSH
10.0000 mL | INTRAVENOUS | Status: DC | PRN
  Administered 2021-02-09: 10 mL

## 2021-02-09 MED ORDER — SODIUM CHLORIDE 0.9 % IV SOLN
750.0000 mg | Freq: Once | INTRAVENOUS | Status: AC
  Administered 2021-02-09: 750 mg via INTRAVENOUS
  Filled 2021-02-09: qty 75

## 2021-02-09 MED ORDER — PALONOSETRON HCL INJECTION 0.25 MG/5ML
0.2500 mg | Freq: Once | INTRAVENOUS | Status: AC
  Administered 2021-02-09: 0.25 mg via INTRAVENOUS
  Filled 2021-02-09: qty 5

## 2021-02-09 MED ORDER — SODIUM CHLORIDE 0.9 % IV SOLN
150.0000 mg | Freq: Once | INTRAVENOUS | Status: AC
  Administered 2021-02-09: 150 mg via INTRAVENOUS
  Filled 2021-02-09: qty 150

## 2021-02-09 MED ORDER — HEPARIN SOD (PORK) LOCK FLUSH 100 UNIT/ML IV SOLN
500.0000 [IU] | Freq: Once | INTRAVENOUS | Status: AC | PRN
  Administered 2021-02-09: 500 [IU]

## 2021-02-09 MED ORDER — DIPHENHYDRAMINE HCL 50 MG/ML IJ SOLN
50.0000 mg | Freq: Once | INTRAMUSCULAR | Status: AC
  Administered 2021-02-09: 50 mg via INTRAVENOUS
  Filled 2021-02-09: qty 1

## 2021-02-09 MED ORDER — SODIUM CHLORIDE 0.9 % IV SOLN: Freq: Once | INTRAVENOUS | Status: AC

## 2021-02-09 MED ORDER — SODIUM CHLORIDE 0.9 % IV SOLN
10.0000 mg | Freq: Once | INTRAVENOUS | Status: AC
  Administered 2021-02-09: 10 mg via INTRAVENOUS
  Filled 2021-02-09: qty 10

## 2021-02-09 MED ORDER — FAMOTIDINE 20 MG IN NS 100 ML IVPB
20.0000 mg | Freq: Once | INTRAVENOUS | Status: AC
  Administered 2021-02-09: 20 mg via INTRAVENOUS
  Filled 2021-02-09: qty 100

## 2021-02-09 NOTE — Patient Instructions (Signed)
[facility]  Discharge Instructions: Thank you for choosing [facility] to provide your oncology and hematology care.   If you have a lab appointment with the Cancer Center, please go directly to the Cancer Center and check in at the registration area.   Wear comfortable clothing and clothing appropriate for easy access to any Portacath or PICC line.   We strive to give you quality time with your provider. You may need to reschedule your appointment if you arrive late (15 or more minutes).  Arriving late affects you and other patients whose appointments are after yours.  Also, if you miss three or more appointments without notifying the office, you may be dismissed from the clinic at the provider's discretion.      For prescription refill requests, have your pharmacy contact our office and allow 72 hours for refills to be completed.    Today you received the following chemotherapy and/or immunotherapy agents: Paclitaxel (Taxol) and Carboplatin.   To help prevent nausea and vomiting after your treatment, we encourage you to take your nausea medication as directed.  BELOW ARE SYMPTOMS THAT SHOULD BE REPORTED IMMEDIATELY: *FEVER GREATER THAN 100.4 F (38 C) OR HIGHER *CHILLS OR SWEATING *NAUSEA AND VOMITING THAT IS NOT CONTROLLED WITH YOUR NAUSEA MEDICATION *UNUSUAL SHORTNESS OF BREATH *UNUSUAL BRUISING OR BLEEDING *URINARY PROBLEMS (pain or burning when urinating, or frequent urination) *BOWEL PROBLEMS (unusual diarrhea, constipation, pain near the anus) TENDERNESS IN MOUTH AND THROAT WITH OR WITHOUT PRESENCE OF ULCERS (sore throat, sores in mouth, or a toothache) UNUSUAL RASH, SWELLING OR PAIN  UNUSUAL VAGINAL DISCHARGE OR ITCHING   Items with * indicate a potential emergency and should be followed up as soon as possible or go to the Emergency Department if any problems should occur.  Please show the CHEMOTHERAPY ALERT CARD or IMMUNOTHERAPY  ALERT CARD at check-in to the Emergency Department and triage nurse.  Should you have questions after your visit or need to cancel or reschedule your appointment, please contact [facility]  Dept: [phone]  and follow the prompts.  Office hours are 8:00 a.m. to 4:30 p.m. Monday - Friday. Please note that voicemails left after 4:00 p.m. may not be returned until the following business day.  We are closed weekends and major holidays. You have access to a nurse at all times for urgent questions. Please call the main number to the clinic Dept: [phone] and follow the prompts.   For any non-urgent questions, you may also contact your provider using MyChart. We now offer e-Visits for anyone 18 and older to request care online for non-urgent symptoms. For details visit [email or web address].   Also download the MyChart app! Go to the app store, search "MyChart", open the app, select [facility], and log in with your MyChart username and password.  Due to Covid, a mask is required upon entering the hospital/clinic. If you do not have a mask, one will be given to you upon arrival. For doctor visits, patients may have 1 support person aged 18 or older with them. For treatment visits, patients cannot have anyone with them due to current Covid guidelines and our immunocompromised population.   

## 2021-02-09 NOTE — Progress Notes (Signed)
This is a brief visit note She is seen in the infusion She is being evaluated due to recent UTI and skin infection Urine cultures negative for growth I have reviewed all the pictures taken Her skin infection has resolved I told her to stop Bactrim She will proceed with infusion as scheduled

## 2021-02-09 NOTE — Assessment & Plan Note (Signed)
I have reviewed her recent labs UTI has resolved Her skin infection has resolved She will proceed with treatment as scheduled

## 2021-02-13 ENCOUNTER — Encounter: Payer: Self-pay | Admitting: Hematology and Oncology

## 2021-02-13 ENCOUNTER — Telehealth: Payer: Self-pay | Admitting: Genetic Counselor

## 2021-02-13 ENCOUNTER — Encounter: Payer: Self-pay | Admitting: Genetic Counselor

## 2021-02-13 DIAGNOSIS — Z1379 Encounter for other screening for genetic and chromosomal anomalies: Secondary | ICD-10-CM

## 2021-02-13 HISTORY — DX: Encounter for other screening for genetic and chromosomal anomalies: Z13.79

## 2021-02-13 NOTE — Telephone Encounter (Signed)
Revealed negative germline genetic testing.  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 It could be sporadic/familial, due to a different gene that we are not testing, or maybe our current technology may not be able to pick something up.  It will be important for her to keep in contact with genetics to keep up with whether additional testing may be needed.  HRD testing pending.

## 2021-02-16 ENCOUNTER — Telehealth: Payer: Self-pay | Admitting: Oncology

## 2021-02-16 NOTE — Telephone Encounter (Signed)
Called Brittany Middleton and let her know that we have contacted [facility] regarding her stiches and that they should be reaching out to her.

## 2021-02-16 NOTE — Telephone Encounter (Signed)
Left a message for the triage nurse at [facility] regarding stiches that are irritating with her porta cath to see if she can be seen by Dr. Kieth Brightly.

## 2021-02-16 NOTE — Telephone Encounter (Signed)
Abigail Butts - Triage Nurse from Dr. Amie [place] office called back.  She called [facility] and checked with their PA. They usually would trim the stitch but it doesn't look like there would be enough to trim.  They are going to have [place] monitor the area and will see her if the stiches start to irritate the area.

## 2021-02-18 ENCOUNTER — Telehealth: Payer: Self-pay | Admitting: Pulmonary Disease

## 2021-02-18 DIAGNOSIS — G4733 Obstructive sleep apnea (adult) (pediatric): Secondary | ICD-10-CM

## 2021-02-18 NOTE — Telephone Encounter (Signed)
Patient is returning phone call. Patient phone number is [phone].

## 2021-02-18 NOTE — Telephone Encounter (Signed)
Called and spoke with Patient.  Patient stated she currently has stomach cancer and feels that the cpap pressure is to much for her stomach right now.  Patient stated she has received her new cpap, but has not been able to tolerate current settings. Patient DME is [employer].    Message routed to Dr. Halford Chessman to advise

## 2021-02-18 NOTE — Telephone Encounter (Signed)
LMTCB

## 2021-02-19 ENCOUNTER — Encounter: Payer: Self-pay | Admitting: Hematology and Oncology

## 2021-02-20 ENCOUNTER — Ambulatory Visit: Payer: Medicare Other | Attending: Hematology and Oncology

## 2021-02-20 ENCOUNTER — Other Ambulatory Visit: Payer: Self-pay

## 2021-02-20 DIAGNOSIS — C786 Secondary malignant neoplasm of retroperitoneum and peritoneum: Secondary | ICD-10-CM | POA: Insufficient documentation

## 2021-02-20 DIAGNOSIS — R293 Abnormal posture: Secondary | ICD-10-CM

## 2021-02-20 DIAGNOSIS — M6281 Muscle weakness (generalized): Secondary | ICD-10-CM | POA: Insufficient documentation

## 2021-02-20 DIAGNOSIS — C762 Malignant neoplasm of abdomen: Secondary | ICD-10-CM

## 2021-02-20 NOTE — Telephone Encounter (Signed)
This is a patient of Dr. Erin Fulling.  Will route message to him.

## 2021-02-20 NOTE — Therapy (Signed)
[facility] [place], [place], [address] Phone: [phone]   Fax:  [phone]  Physical Therapy Evaluation  Patient Details  Name: Brittany Middleton MRN: [id number] Date of Birth: 11-30-1950 Referring Provider (PT): Dr. [name]   Encounter Date: 02/20/2021   PT End of Session - 02/20/21 1959     Visit Number 1    Number of Visits 8    Date for PT Re-Evaluation 03/20/21    PT Start Time 1000    PT Stop Time 1054    PT Time Calculation (min) 54 min    Activity Tolerance Patient tolerated treatment well    Behavior During Therapy [facility] for tasks assessed/performed             Past Medical History:  Diagnosis Date   Acute blood loss anemia 07/17/2018   Anemia 07/17/2018   Arthritis    Ascending aortic aneurysm ([place]) 05/23/2019   Asthma    Cancer ([place])    Cervical disc disease    MRI 2016   Chest pain with moderate risk for cardiac etiology 02/27/2018   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   Family history of breast cancer 01/27/2021   Gastrointestinal bleeding 07/25/2018   Gastrointestinal hemorrhage with melena 07/16/2018   GERD (gastroesophageal reflux disease)    Hepatic steatosis    per imaging 2016, 2019   Hiatal hernia    History of palpitations    Holter monitor, 2017: NSR, occasional PVC, PACs, no arrhythmia   HLD (hyperlipidemia) 02/27/2018   HTN (hypertension) 02/27/2018   Hypercholesteremia    Hypercholesterolemia 04/07/2017   Hypertension    Hypertensive disorder 04/07/2017   Hypokalemia 07/17/2018   Lumbar disc disease    MRI, 2017    Morbid obesity ([place]) 05/22/2018   Overweight 03/03/2018   Palpitations 08/19/2015   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[place])    Pneumonia    Pyelonephritis 03/30/2020   Sepsis secondary to UTI ([place]) 04/25/2020   Severe sepsis with acute organ dysfunction ([place]) 04/25/2020   Sleep apnea    UTI (urinary tract infection)     Past Surgical History:  Procedure  Laterality Date   BLADDER REPAIR     BREAST EXCISIONAL BIOPSY Left    CARPAL TUNNEL RELEASE Right    CATARACT EXTRACTION, BILATERAL  2014   COLONOSCOPY WITH PROPOFOL N/A 07/18/2018   Procedure: COLONOSCOPY WITH PROPOFOL;  Surgeon: Laurence Spates, MD;  Location: [facility];  Service: Endoscopy;  Laterality: N/A;   ESOPHAGOGASTRODUODENOSCOPY (EGD) WITH PROPOFOL N/A 07/17/2018   Procedure: ESOPHAGOGASTRODUODENOSCOPY (EGD) WITH PROPOFOL;  Surgeon: Laurence Spates, MD;  Location: [facility];  Service: Endoscopy;  Laterality: N/A;   LAPAROSCOPY N/A 12/30/2020   Procedure: LAPAROSCOPY DIAGNOSTIC; WITH  BIOPSY OF ABDOMINAL WALL NODULES AND BIOPSY OF PERICOLONIC EXUDATE;  Surgeon: Kinsinger, Arta Bruce, MD;  Location: [facility];  Service: General;  Laterality: N/A;   PORTACATH PLACEMENT N/A 01/09/2021   Procedure: PORT INSERTION WITH [place] & FLUORO;  Surgeon: Kieth Brightly Arta Bruce, MD;  Location: [facility];  Service: General;  Laterality: N/A;   REPAIR RECTOCELE     TOE SURGERY Left     There were no vitals filed for this visit.    Subjective Assessment - 02/20/21 1016     Subjective Pending surgery for complete hysterectomy 10/4-10/11/2020. Having surgery at [facility]. After surgery would resume chemo 04/13/2021.  Has had a shadow of neuropathy but nothing major.  It comes and goes.  Sometimes at night her feet get numb especially ball of feet  but goes away quickly and does not happen in the day time. She has not had any nauseau. She does get muscle aches at night from chemo that lasts Day 3-8 especially in quads and calves. Notices that  she feels like she is walking more flat footed and she is walking with stiffer strides and less hip rotation, With short periods of walking she is more flat footed than heel to toe. Gets an ache in her right shoulder with alot of use and she believes this is because of the port. 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 She is presently undergoing chemotherapy with Carboplatin and Paclitaxel every 3 weeeks and has 3 more cycles.  Planning complete hysterectomy for 03/24/21-03/26/2021 and likely to resume chemo on 04/13/2021 if all goes well    Patient Stated Goals Get stronger for upcoming surgery and therapies    Currently in Pain? No/denies    Pain Score 0-No pain                [facility] PT Assessment - 02/20/21 0001       Assessment   Medical Diagnosis peritoneal Carcinomatosis    Referring Provider (PT) Dr. [name]    Onset Date/Surgical Date 12/30/20    Hand Dominance Right    Prior Therapy yes      Precautions   Precaution Comments Active Cancer      Restrictions   Weight Bearing Restrictions No      Balance Screen   Has the patient fallen in the past 6 months No    Has the patient had a decrease in activity level because of a fear of falling?  No    Is the patient reluctant to leave their home because of a fear of falling?  No      Home Ecologist residence    Living Arrangements Parent    Available Help at Discharge Friend(s)    Type of [place] Access Level entry    [place] One level      Prior Function   Level of [place] Retired    Leisure garden, walk dog, antiquing      Cognition   Overall Cognitive Status Within Functional Limits for tasks assessed      Observation/Other Assessments   Observations Porta Cath on right, discomfort with right shoulder ROM secondary to portacath      Posture/Postural Control   Posture/Postural Control Postural limitations    Postural Limitations Rounded Shoulders;Forward head      AROM   Right Shoulder Extension 40 Degrees    Right Shoulder Flexion 125 Degrees    Right Shoulder ABduction 120 Degrees    Left Shoulder Extension 50 Degrees    Left Shoulder Flexion 148 Degrees     Left Shoulder ABduction 130 Degrees      Strength   Right Hip Flexion 4/5    Right Hip External Rotation  4/5    Right Hip Internal Rotation 4+/5    Right Hip ABduction 4+/5   tested in sitting   Left Hip Flexion 4+/5    Left Hip External Rotation 4+/5    Left Hip ABduction 4+/5   tested in sitting   Right Knee Flexion 5/5    Right Knee Extension 4+/5    Left Knee  Flexion 5/5    Left Knee Extension 4+/5    Right Ankle Dorsiflexion 5/5    Right Ankle Plantar Flexion 4+/5    Left Ankle Dorsiflexion 5/5    Left Ankle Plantar Flexion 4+/5      Transfers   Comments 30 second sit to stand 9 reps      High Level Balance   High Level Balance Comments 4 position balance test; Able to maintain all positions except SLS for 10 seconds but with significant use of arms for balance with tandem/semi tandem stance.  Unable to perform SLS for greater than 5 sec               LYMPHEDEMA/ONCOLOGY QUESTIONNAIRE - 02/20/21 0001       Treatment   Active Chemotherapy Treatment Yes    Date 01/12/21    Past Chemotherapy Treatment No    Active Radiation Treatment No    Past Radiation Treatment No    Current Hormone Treatment No    Past Hormone Therapy No      What other symptoms do you have   Are you having Pain Yes   right shouder from port   Are you having pitting edema Yes    Is it Hard or Difficult finding clothes that fit No    Do you have infections Yes   around port infection                  Quick Dash - 02/20/21 0001     Open a tight or new jar Moderate difficulty    Do heavy household chores (wash walls, wash floors) Moderate difficulty    Carry a shopping bag or briefcase No difficulty    Wash your back No difficulty    Use a knife to cut food No difficulty    Recreational activities in which you take some force or impact through your arm, shoulder, or hand (golf, hammering, tennis) Severe difficulty    During the past week, to what extent has your arm,  shoulder or hand problem interfered with your normal social activities with family, friends, neighbors, or groups? Extremely    During the past week, to what extent has your arm, shoulder or hand problem limited your work or other regular daily activities Quite a bit    Arm, shoulder, or hand pain. Moderate    Tingling (pins and needles) in your arm, shoulder, or hand Mild    Difficulty Sleeping Mild difficulty    DASH Score 40.91 %              Objective measurements completed on examination: See above findings.                    PT Long Term Goals - 02/20/21 2016       PT LONG TERM GOAL #1   Title Pt will be independent with HEP for generalized stregth, flexibility and balance training    Time 4    Period Weeks    Status New    Target Date 03/20/21      PT LONG TERM GOAL #2   Title Pt will perform 12 sit to stands in 30 seconds for decreased risk of falls    Baseline 9    Time 4    Period Weeks    Status New    Target Date 03/20/21      PT LONG TERM GOAL #3   Title Pt will be able to maintain  bilateral SLS for greater than 10 seconds each without excessive use of arms to maintain balance    Time 4    Period Weeks    Status New    Target Date 03/20/21      PT LONG TERM GOAL #4   Title Pt will report  improved heel toe gait pattern after walking short periods    Time 4    Period Weeks    Status New    Target Date 03/20/21                    Plan - 02/20/21 2005     Clinical Impression Statement 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 She is being treated with Carboplatin/Paclitaxel every 21 days for 6 treatments.  Thus far she has not had any major side effects except some minor intermittent tingling and muscle aches.  She has a complete hysterectomy planned for March 24, 2021. She wants to attend PT prior to surgery to be as strong as she can be before going into surgery. She  must avoid a week or so after her chemo secondary to fatigue and will be scheduled as she sees fit 1-2x/week avoiding her bad days after chemo.  Right shoulder ROM is limited slightly secondary to discomfort from Portacath. She will benefit from skilled PT for instruction in a generalized strength and balance program  with updated HEP.    Personal Factors and Comorbidities Comorbidity 3+    Comorbidities Active CA, aortic aneurysm, prior severe sepsis with acute organ failure, HBP    Stability/Clinical Decision Making Stable/Uncomplicated    Clinical Decision Making Low    Rehab Potential Good    PT Frequency 2x / week   Pt to schedule appts as she can around days she is not feeling well from chemo.   PT Duration 4 weeks    PT Treatment/Interventions ADLs/Self Care Home Management;Stair training;Gait training;Therapeutic activities;Therapeutic exercise;Balance training;Neuromuscular re-education;Manual techniques;Patient/family education    PT Next Visit Plan check UE strength,generalized UE, LE, core strength, balance    PT Home Exercise Plan practice sit to stand, SLS at counter top    Consulted and Agree with Plan of Care Patient             Patient will benefit from skilled therapeutic intervention in order to improve the following deficits and impairments:  Decreased activity tolerance, Decreased balance, Decreased knowledge of precautions, Decreased endurance, Decreased range of motion, Decreased strength, Postural dysfunction  Visit Diagnosis: Abnormal posture  Muscle weakness (generalized)  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[place])  Malignant neoplasm of abdomen [facility])     Problem List Patient Active Problem List   Diagnosis Date Noted   Genetic testing 02/13/2021   Skin infection 02/05/2021   Skin irritation 02/05/2021   Family history of breast cancer 01/27/2021   Family history of lung cancer 01/27/2021   Dysuria 01/26/2021   Physical debility 01/10/2021    Diplopia 01/09/2021   Uterine cancer ([place]) 01/01/2021   Peritoneal carcinomatosis ([place]) 01/01/2021   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   SBO (small bowel obstruction) ([place]) 12/28/2020   Abdominal pain 05/28/2020   Recurrent UTI 05/28/2020   Severe sepsis ([place]) 05/21/2020   Asthma    Sleep apnea    Severe sepsis with acute organ dysfunction ([place]) 04/25/2020   Sepsis secondary to UTI ([place]) 04/25/2020   Cervical disc disease    Hepatic steatosis    History of palpitations    Hypercholesteremia  Hypertension    Lumbar disc disease    Urinary tract infection due to extended-spectrum beta lactamase (ESBL) producing Escherichia coli    Pyelonephritis 03/30/2020   Ascending aortic aneurysm ([facility]) 05/23/2019   Gastrointestinal bleeding 07/25/2018   Hiatal hernia    Anemia 07/17/2018   Acute blood loss anemia 07/17/2018   Hypokalemia 07/17/2018   Gastrointestinal hemorrhage with melena 07/16/2018   Obesity, Class III, BMI 40-49.9 (morbid obesity) ([place]) 05/22/2018   Overweight 03/03/2018   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

## 2021-02-20 NOTE — Telephone Encounter (Signed)
Patient's cpap settings can be changed to auto-titrating CPAP 5-10cmH20 or the pressure can be reduced from 8 to 6 cmH2O.  It is likely her weight loss has improved her issues with sleep apnea.  Thanks, Wille Glaser

## 2021-02-20 NOTE — Telephone Encounter (Signed)
Pt notified of response per Dr Erin Fulling  She verbalized understanding  She prefers to try auto 5-10  Order sent to [facility]

## 2021-02-24 ENCOUNTER — Other Ambulatory Visit: Payer: Medicare Other

## 2021-02-24 ENCOUNTER — Ambulatory Visit: Payer: Medicare Other

## 2021-02-24 ENCOUNTER — Ambulatory Visit: Payer: Medicare Other | Admitting: Hematology and Oncology

## 2021-02-24 ENCOUNTER — Encounter: Payer: Self-pay | Admitting: Genetic Counselor

## 2021-02-27 ENCOUNTER — Telehealth: Payer: Self-pay

## 2021-02-27 MED FILL — Dexamethasone Sodium Phosphate Inj 100 MG/10ML: INTRAMUSCULAR | Qty: 1 | Status: AC

## 2021-02-27 MED FILL — Fosaprepitant Dimeglumine For IV Infusion 150 MG (Base Eq): INTRAVENOUS | Qty: 5 | Status: AC

## 2021-02-27 NOTE — Telephone Encounter (Signed)
Returned her call. She has a small amount of pink blood on tissue after bm today. She has made some diet changes. Denies constipation.  Instructed to monitor over the weekend and Dr. [name] would see her Monday. She verbalized understanding.

## 2021-03-02 ENCOUNTER — Inpatient Hospital Stay: Payer: Medicare Other | Admitting: Hematology and Oncology

## 2021-03-02 ENCOUNTER — Telehealth: Payer: Self-pay

## 2021-03-02 ENCOUNTER — Inpatient Hospital Stay: Payer: Medicare Other

## 2021-03-02 ENCOUNTER — Other Ambulatory Visit: Payer: Medicare Other

## 2021-03-02 NOTE — Telephone Encounter (Signed)
Called regarding today's appts. She is at [facility] ER now and she is having bleeding. She said she called at 6 am today to cancel. Todays appts canceled.

## 2021-03-03 ENCOUNTER — Encounter: Payer: Self-pay | Admitting: Hematology and Oncology

## 2021-03-03 ENCOUNTER — Telehealth: Payer: Self-pay

## 2021-03-03 NOTE — Telephone Encounter (Signed)
Returned her call and left a message. Scheduler is working on getting her appts moved to next week.

## 2021-03-04 ENCOUNTER — Telehealth: Payer: Self-pay | Admitting: Genetic Counselor

## 2021-03-04 ENCOUNTER — Other Ambulatory Visit: Payer: Self-pay

## 2021-03-04 ENCOUNTER — Ambulatory Visit: Payer: Self-pay | Admitting: Genetic Counselor

## 2021-03-04 ENCOUNTER — Telehealth: Payer: Self-pay | Admitting: Hematology and Oncology

## 2021-03-04 DIAGNOSIS — C801 Malignant (primary) neoplasm, unspecified: Secondary | ICD-10-CM | POA: Insufficient documentation

## 2021-03-04 DIAGNOSIS — J189 Pneumonia, unspecified organism: Secondary | ICD-10-CM | POA: Insufficient documentation

## 2021-03-04 DIAGNOSIS — Z801 Family history of malignant neoplasm of trachea, bronchus and lung: Secondary | ICD-10-CM

## 2021-03-04 DIAGNOSIS — C786 Secondary malignant neoplasm of retroperitoneum and peritoneum: Secondary | ICD-10-CM

## 2021-03-04 DIAGNOSIS — Z1379 Encounter for other screening for genetic and chromosomal anomalies: Secondary | ICD-10-CM

## 2021-03-04 DIAGNOSIS — M1990 Unspecified osteoarthritis, unspecified site: Secondary | ICD-10-CM | POA: Insufficient documentation

## 2021-03-04 DIAGNOSIS — Z803 Family history of malignant neoplasm of breast: Secondary | ICD-10-CM

## 2021-03-04 DIAGNOSIS — K219 Gastro-esophageal reflux disease without esophagitis: Secondary | ICD-10-CM | POA: Insufficient documentation

## 2021-03-04 NOTE — Telephone Encounter (Signed)
Discussed negative BRCA1/2 tumor mutation status and inability to [employer] status due to low tumor ratio of sample.  Discussed option for submitting sample after debulking surgery.  Brittany Middleton plans to have her surgery through [facility].  Requested surgery date when known to attempt to send new sample for GIS analysis.

## 2021-03-04 NOTE — Telephone Encounter (Signed)
Scheduled per 9/14 staff msg. Called and spoke with pt confirmed 9/19 appts

## 2021-03-05 ENCOUNTER — Encounter: Payer: Self-pay | Admitting: Cardiology

## 2021-03-05 ENCOUNTER — Ambulatory Visit (INDEPENDENT_AMBULATORY_CARE_PROVIDER_SITE_OTHER): Payer: Medicare Other | Admitting: Cardiology

## 2021-03-05 ENCOUNTER — Other Ambulatory Visit: Payer: Self-pay

## 2021-03-05 VITALS — BP 144/56 | HR 66 | Ht 65.0 in | Wt 244.0 lb

## 2021-03-05 DIAGNOSIS — E782 Mixed hyperlipidemia: Secondary | ICD-10-CM | POA: Diagnosis not present

## 2021-03-05 DIAGNOSIS — I2541 Coronary artery aneurysm: Secondary | ICD-10-CM | POA: Diagnosis not present

## 2021-03-05 DIAGNOSIS — I2584 Coronary atherosclerosis due to calcified coronary lesion: Secondary | ICD-10-CM

## 2021-03-05 DIAGNOSIS — I10 Essential (primary) hypertension: Secondary | ICD-10-CM

## 2021-03-05 DIAGNOSIS — I2510 Atherosclerotic heart disease of native coronary artery without angina pectoris: Secondary | ICD-10-CM | POA: Insufficient documentation

## 2021-03-05 NOTE — Patient Instructions (Signed)
Medication Instructions:  Your physician has recommended you make the following change in your medication:   Stop Plavix.  *If you need a refill on your cardiac medications before your next appointment, please call your pharmacy*   Lab Work: None ordered If you have labs (blood work) drawn today and your tests are completely normal, you will receive your results only by: [place] (if you have MyChart) OR A paper copy in the mail If you have any lab test that is abnormal or we need to change your treatment, we will call you to review the results.   Testing/Procedures: None ordered   Follow-Up: At [facility], you and your health needs are our priority.  As part of our continuing mission to provide you with exceptional heart care, we have created designated Provider Care Teams.  These Care Teams include your primary Cardiologist (physician) and Advanced Practice Providers (APPs -  Physician Assistants and Nurse Practitioners) who all work together to provide you with the care you need, when you need it.  We recommend signing up for the patient portal called "MyChart".  Sign up information is provided on this After Visit Summary.  MyChart is used to connect with patients for Virtual Visits (Telemedicine).  Patients are able to view lab/test results, encounter notes, upcoming appointments, etc.  Non-urgent messages can be sent to your provider as well.   To learn more about what you can do with MyChart, go to [email or web address].    Your next appointment:   6 month(s)  The format for your next appointment:   In Person  Provider:   Jyl Heinz, MD   Other Instructions NA

## 2021-03-05 NOTE — Progress Notes (Signed)
Cardiology Office Note:    Date:  03/05/2021   ID:  Brittany Middleton, DOB September 02, 1950, MRN [id number]  PCP:  [name], MD  Cardiologist:  Jenean Lindau, MD   Referring MD: [name], MD    ASSESSMENT:    1. Primary hypertension   2. Mixed hyperlipidemia   3. Coronary artery aneurysm   4. Coronary artery calcification    PLAN:    In order of problems listed above:  Coronary artery calcification: Secondary prevention stressed with the patient.  Importance of compliance with diet medication stressed and she vocalized understanding.  She walks on a regular basis without any symptoms she walks about 10 to 15 minutes with no issues. Coronary artery aneurysm: On Plavix but has had couple of bleeding episodes and she wishes to stop it appears that the risks outweigh the benefits at this time and I agree with her and told her to stop the Plavix at this point. Essential hypertension.  Blood pressure stable and diet was emphasized. Mixed dyslipidemia obesity: Lifestyle modification was urged diet was emphasized. Abdominal cancer: She is planning to undergo surgery for debulking it appears and then chemotherapy.Patient will be seen in follow-up appointment in 6 months or earlier if the patient has any concerns    Medication Adjustments/Labs and Tests Ordered: Current medicines are reviewed at length with the patient today.  Concerns regarding medicines are outlined above.  No orders of the defined types were placed in this encounter.  No orders of the defined types were placed in this encounter.    No chief complaint on file.    History of Present Illness:    Brittany Middleton is a 70 y.o. female.  Patient has history of coronary artery aneurysm, essential hypertension dyslipidemia obesity and recently been diagnosed to have abdominal cancer.  She is planning to undergo surgery and chemotherapy for this.  Denies any chest pain orthopnea or PND.  She is on  Plavix and has had no issues with bleeding in the past.  She wants to go off Plavix.  This was for coronary aneurysm.  At the time of my evaluation, the patient is alert awake oriented and in no distress.  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 Last Assessment & Plan:  Formatting of this note might be different from the original. - continue home meds   Family history of breast cancer 01/27/2021   Family history of lung cancer 01/27/2021   Gastrointestinal bleeding 07/25/2018   Gastrointestinal hemorrhage with melena 07/16/2018   Genetic testing 02/13/2021   No pathogenic variants detected in [facility].  The report date is February 12, 2021.   The [facility] gene panel offered by [employer] includes sequencing and deletion/duplication testing of the following 48 genes: APC, ATM, AXIN2, BAP1, BARD1, BMPR1A,  BRCA1, BRCA2, BRIP1, CHD1, CDK4, CDKN2A(p16 and p14ARF), CHEK2, CTNNA1, EGFR, EPCAM, FH, FLCN, GREM1, HOXB13, MEN1, M   GERD (gastroesophageal reflux disease)    Hematuria 06/19/2020   Hepatic steatosis    per imaging 2016, 2019   Hiatal hernia    History of palpitations    Holter monitor, 2017: NSR, occasional PVC, PACs, no arrhythmia   HLD (hyperlipidemia) 02/27/2018   HTN (hypertension) 02/27/2018   Hypercholesteremia     Hypercholesterolemia 04/07/2017   Hyperglycemia 06/19/2020   Hypertension    Hypertensive disorder 04/07/2017   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   Hypokalemia 07/17/2018   Lumbar disc disease    MRI, 2017    Obesity, Class III, BMI 40-49.9 (morbid obesity) ([place]) 05/22/2018   Overweight 03/03/2018   Palpitations 08/19/2015   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[place])    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   Physical debility 01/10/2021   Pneumonia    Pure hypercholesterolemia, unspecified 04/07/2017   Pyelonephritis 03/30/2020   Recurrent UTI 05/28/2020   SBO (small bowel obstruction) ([place]) 12/28/2020   Sepsis secondary to UTI ([place]) 04/25/2020   Severe sepsis ([place]) 05/21/2020   Severe sepsis with acute organ dysfunction ([place]) 04/25/2020   Skin infection 02/05/2021   Skin irritation 02/05/2021   Sleep apnea    Urinary tract infection due to extended-spectrum beta lactamase (ESBL) producing Escherichia coli    Uterine cancer ([place]) 01/01/2021    Past Surgical History:  Procedure Laterality Date   BLADDER REPAIR     BREAST EXCISIONAL BIOPSY Left    CARPAL TUNNEL RELEASE Right    CATARACT EXTRACTION, BILATERAL  2014   COLONOSCOPY WITH PROPOFOL N/A 07/18/2018   Procedure: COLONOSCOPY WITH PROPOFOL;  Surgeon: Laurence Spates, MD;  Location: [place];  Service: Endoscopy;  Laterality: N/A;   ESOPHAGOGASTRODUODENOSCOPY (EGD) WITH PROPOFOL N/A 07/17/2018   Procedure: ESOPHAGOGASTRODUODENOSCOPY (EGD) WITH PROPOFOL;  Surgeon: Laurence Spates, MD;  Location: [place];  Service: Endoscopy;  Laterality: N/A;   LAPAROSCOPY N/A 12/30/2020   Procedure: LAPAROSCOPY DIAGNOSTIC; WITH  BIOPSY OF ABDOMINAL WALL NODULES AND BIOPSY OF PERICOLONIC EXUDATE;  Surgeon: Kinsinger, Arta Bruce, MD;  Location: [facility];  Service: General;  Laterality: N/A;   PORTACATH PLACEMENT N/A 01/09/2021   Procedure: PORT INSERTION WITH [place] & FLUORO;  Surgeon: Mickeal Skinner, MD;  Location: [facility];  Service: General;   Laterality: N/A;   REPAIR RECTOCELE     TOE SURGERY Left     Current Medications: Current Meds  Medication Sig   acetaminophen (TYLENOL) 500 MG tablet Take 1,000 mg by mouth every 6 (six) hours as needed for mild pain, moderate pain, fever or headache.   albuterol (PROVENTIL) (2.5 MG/3ML) 0.083% nebulizer solution Take 2.5 mg by nebulization every 6 (six) hours as needed for wheezing or shortness of breath.   alclomethasone (ACLOVATE) 0.05 % ointment Apply 1 application topically 2 (two) times daily.   BIOTIN PO Take 1 tablet by mouth daily.   Cholecalciferol (VITAMIN D-3) 25 MCG (1000 UT) CAPS Take 1,000 Units by mouth daily.   clopidogrel (PLAVIX) 75 MG tablet Take 1 tablet (75 mg total) by mouth daily.   co-enzyme Q-10 30 MG capsule Take 30 mg by mouth daily.   dexamethasone (DECADRON) 4 MG tablet Take 2 tabs at the night before and 2 tab the morning of chemotherapy, every 3 weeks, by mouth x 6 cycles   estradiol (ESTRACE) 0.1 MG/GM vaginal cream Place 1 Applicatorful vaginally 3 (three) times a week.   fluticasone (FLONASE) 50 MCG/ACT  nasal spray Place 1 spray into both nostrils daily.   fluticasone (FLOVENT HFA) 110 MCG/ACT inhaler Inhale 2 puffs into the lungs 2 (two) times daily.   hydrocortisone 2.5 % cream Apply 1 application topically daily as needed for itching (forehead).   lidocaine-prilocaine (EMLA) cream Apply to affected area once   losartan (COZAAR) 50 MG tablet Take 1 tablet (50 mg total) by mouth daily.   Melatonin 5 MG CHEW Chew 5 mg by mouth at bedtime.    Multiple Vitamins-Minerals (MULTIVITAMIN WITH MINERALS) tablet Take 1 tablet by mouth daily.   ondansetron (ZOFRAN) 8 MG tablet Take 1 tablet (8 mg total) by mouth every 8 (eight) hours as needed for refractory nausea / vomiting.   pantoprazole (PROTONIX) 40 MG tablet Take 1 tablet (40 mg total) by mouth daily.   polyethylene glycol (MIRALAX / GLYCOLAX) 17 g packet Take 8.5 g by mouth 2 (two) times daily.    rosuvastatin (CRESTOR) 20 MG tablet TAKE ONE TABLET BY MOUTH ONE TIME DAILY     Allergies:   Macrobid [nitrofurantoin], Ciprofloxacin, Codeine, and Azithromycin   Social History   Socioeconomic History   Marital status: Divorced    Spouse name: Not on file   Number of children: 2   Years of education: Not on file   Highest education level: Master's degree (e.g., MA, MS, MEng, MEd, MSW, MBA)  Occupational History   Occupation: Retired  Tobacco Use   Smoking status: Never   Smokeless tobacco: Never  Vaping Use   Vaping Use: Never used  Substance and Sexual Activity   Alcohol use: No   Drug use: No   Sexual activity: Not on file  Other Topics Concern   Not on file  Social History Narrative   Not on file   Social Determinants of Health   Financial Resource Strain: Not on file  Food Insecurity: Not on file  Transportation Needs: Not on file  Physical Activity: Not on file  Stress: Not on file  Social Connections: Not on file     Family History: The patient's family history includes Breast cancer (age of onset: 61) in her mother; CAD in her mother; Cancer in her paternal aunt; Cancer (age of onset: 41) in her paternal aunt; Lung cancer in her paternal grandfather; Lung cancer (age of onset: 26) in her father.  ROS:   Please see the history of present illness.    All other systems reviewed and are negative.  EKGs/Labs/Other Studies Reviewed:    The following studies were reviewed today: I discussed my findings with the patient at length.  Primary   Recent Labs: 05/21/2020: B Natriuretic Peptide 153.7 12/31/2020: Magnesium 1.8 02/05/2021: ALT 18; BUN 12; Creatinine 0.70; Hemoglobin 12.5; Platelet Count 163; Potassium 4.4; Sodium 142  Recent Lipid Panel    Component Value Date/Time   CHOL 177 02/04/2020 1036   TRIG 166 (H) 02/04/2020 1036   HDL 48 02/04/2020 1036   CHOLHDL 3.7 02/04/2020 1036   CHOLHDL 4.5 02/28/2018 0345   VLDL 41 (H) 02/28/2018 0345   LDLCALC  100 (H) 02/04/2020 1036    Physical Exam:    VS:  BP (!) 144/56   Pulse 66   Ht '5\' 5"'  (1.651 m)   Wt 244 lb (110.7 kg)   SpO2 96%   BMI 40.60 kg/m     Wt Readings from Last 3 Encounters:  03/05/21 244 lb (110.7 kg)  02/09/21 242 lb 4 oz (109.9 kg)  02/05/21 241 lb 9.6 oz (  109.6 kg)     GEN: Patient is in no acute distress HEENT: Normal NECK: No JVD; No carotid bruits LYMPHATICS: No lymphadenopathy CARDIAC: Hear sounds regular, 2/6 systolic murmur at the apex. RESPIRATORY:  Clear to auscultation without rales, wheezing or rhonchi  ABDOMEN: Soft, non-tender, non-distended MUSCULOSKELETAL:  No edema; No deformity  SKIN: Warm and dry NEUROLOGIC:  Alert and oriented x 3 PSYCHIATRIC:  Normal affect   Signed, Jenean Lindau, MD  03/05/2021 4:12 PM    [facility]

## 2021-03-06 ENCOUNTER — Encounter: Payer: Self-pay | Admitting: Hematology and Oncology

## 2021-03-06 ENCOUNTER — Inpatient Hospital Stay: Payer: Medicare Other

## 2021-03-06 ENCOUNTER — Inpatient Hospital Stay: Payer: Medicare Other | Attending: Hematology and Oncology | Admitting: Hematology and Oncology

## 2021-03-06 ENCOUNTER — Ambulatory Visit: Payer: Medicare Other

## 2021-03-06 ENCOUNTER — Ambulatory Visit: Payer: Medicare Other | Admitting: Hematology and Oncology

## 2021-03-06 ENCOUNTER — Encounter: Payer: Self-pay | Admitting: Oncology

## 2021-03-06 ENCOUNTER — Telehealth: Payer: Self-pay | Admitting: Oncology

## 2021-03-06 ENCOUNTER — Other Ambulatory Visit: Payer: Medicare Other

## 2021-03-06 ENCOUNTER — Ambulatory Visit
Admission: RE | Admit: 2021-03-06 | Discharge: 2021-03-06 | Disposition: A | Discharge status: Home / Self Care | Payer: Self-pay | Source: Ambulatory Visit | Attending: Hematology and Oncology | Admitting: Hematology and Oncology

## 2021-03-06 DIAGNOSIS — C786 Secondary malignant neoplasm of retroperitoneum and peritoneum: Secondary | ICD-10-CM

## 2021-03-06 DIAGNOSIS — G629 Polyneuropathy, unspecified: Secondary | ICD-10-CM | POA: Insufficient documentation

## 2021-03-06 DIAGNOSIS — G620 Drug-induced polyneuropathy: Secondary | ICD-10-CM | POA: Insufficient documentation

## 2021-03-06 DIAGNOSIS — Z5111 Encounter for antineoplastic chemotherapy: Secondary | ICD-10-CM | POA: Diagnosis not present

## 2021-03-06 DIAGNOSIS — C482 Malignant neoplasm of peritoneum, unspecified: Secondary | ICD-10-CM | POA: Diagnosis not present

## 2021-03-06 DIAGNOSIS — D61818 Other pancytopenia: Secondary | ICD-10-CM | POA: Diagnosis not present

## 2021-03-06 DIAGNOSIS — Z79899 Other long term (current) drug therapy: Secondary | ICD-10-CM | POA: Diagnosis not present

## 2021-03-06 DIAGNOSIS — T451X5A Adverse effect of antineoplastic and immunosuppressive drugs, initial encounter: Secondary | ICD-10-CM | POA: Diagnosis not present

## 2021-03-06 DIAGNOSIS — C762 Malignant neoplasm of abdomen: Secondary | ICD-10-CM

## 2021-03-06 LAB — CBC WITH DIFFERENTIAL (CANCER CENTER ONLY)
Abs Immature Granulocytes: 0.01 10*3/uL (ref 0.00–0.07)
Basophils Absolute: 0 10*3/uL (ref 0.0–0.1)
Basophils Relative: 1 %
Eosinophils Absolute: 0.2 10*3/uL (ref 0.0–0.5)
Eosinophils Relative: 4 %
HCT: 32.5 % — ABNORMAL LOW (ref 36.0–46.0)
Hemoglobin: 10.5 g/dL — ABNORMAL LOW (ref 12.0–15.0)
Immature Granulocytes: 0 %
Lymphocytes Relative: 30 %
Lymphs Abs: 1.1 10*3/uL (ref 0.7–4.0)
MCH: 29.7 pg (ref 26.0–34.0)
MCHC: 32.3 g/dL (ref 30.0–36.0)
MCV: 92.1 fL (ref 80.0–100.0)
Monocytes Absolute: 0.3 10*3/uL (ref 0.1–1.0)
Monocytes Relative: 9 %
Neutro Abs: 2 10*3/uL (ref 1.7–7.7)
Neutrophils Relative %: 56 %
Platelet Count: 141 10*3/uL — ABNORMAL LOW (ref 150–400)
RBC: 3.53 MIL/uL — ABNORMAL LOW (ref 3.87–5.11)
RDW: 16.3 % — ABNORMAL HIGH (ref 11.5–15.5)
WBC Count: 3.7 10*3/uL — ABNORMAL LOW (ref 4.0–10.5)
nRBC: 0 % (ref 0.0–0.2)

## 2021-03-06 LAB — CMP (CANCER CENTER ONLY)
ALT: 21 U/L (ref 0–44)
AST: 22 U/L (ref 15–41)
Albumin: 3.9 g/dL (ref 3.5–5.0)
Alkaline Phosphatase: 65 U/L (ref 38–126)
Anion gap: 10 (ref 5–15)
BUN: 12 mg/dL (ref 8–23)
CO2: 28 mmol/L (ref 22–32)
Calcium: 9.3 mg/dL (ref 8.9–10.3)
Chloride: 106 mmol/L (ref 98–111)
Creatinine: 0.66 mg/dL (ref 0.44–1.00)
GFR, Estimated: >60 mL/min (ref >60)
Glucose, Bld: 99 mg/dL (ref 70–99)
Potassium: 4.1 mmol/L (ref 3.5–5.1)
Sodium: 144 mmol/L (ref 135–145)
Total Bilirubin: 0.5 mg/dL (ref 0.3–1.2)
Total Protein: 7 g/dL (ref 6.5–8.1)

## 2021-03-06 MED ORDER — HEPARIN SOD (PORK) LOCK FLUSH 100 UNIT/ML IV SOLN: 500.0000 [IU] | Freq: Once | INTRAVENOUS | Status: DC

## 2021-03-06 MED ORDER — SODIUM CHLORIDE 0.9% FLUSH: 10.0000 mL | Freq: Once | INTRAVENOUS | Status: DC

## 2021-03-06 NOTE — Progress Notes (Signed)
Requested powershare of CT CAP from 03/02/2021 done at [facility] with [facility] in Radiology.

## 2021-03-06 NOTE — Assessment & Plan Note (Signed)
she has mild peripheral neuropathy, likely related to side effects of treatment. It is only mild, not bothering the patient. I will observe for now If it gets worse in the future, I will consider modifying the dose of the treatment  

## 2021-03-06 NOTE — Assessment & Plan Note (Signed)
Her mild pancytopenia is multifactorial, related to recent chemotherapy and GI bleed Observe only We will proceed with treatment without delay

## 2021-03-06 NOTE — Telephone Encounter (Signed)
Brittany Middleton and rescheduled her appointment with Dr. [name] to today at 12:20.  Also rescheduled her lab/flush apt.

## 2021-03-06 NOTE — Progress Notes (Signed)
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[facility]) I have reviewed documentation from [place] I was able to review the imaging CT myself and agree with interpretation Her tumor marker is now normal We will proceed with chemotherapy as scheduled next week Brittany Middleton will return to [facility] for preop imaging study and tentative plan for interval debulking surgery on October 18 I plan to see her back the Friday after Thanksgiving to resume treatment  Pancytopenia, acquired ([place]) Her mild pancytopenia is multifactorial, related to recent chemotherapy and GI bleed Observe only We will proceed with treatment without delay  Peripheral neuropathy due to chemotherapy [facility]) Brittany Middleton has mild peripheral neuropathy, likely related to side effects of treatment. It is only mild, not bothering the patient. I will observe for now If it gets worse in the future, I will consider modifying the dose of the treatment   No orders of the defined types were placed in this encounter.   All questions were answered. The patient knows to call the clinic with any problems, questions or concerns. The total time spent in the appointment was 20 minutes encounter with patients including review of chart and various tests results, discussions about plan of care and coordination of care plan   [name], MD 03/06/2021 1:45 PM  INTERVAL HISTORY: Please see below for problem oriented charting. Brittany Middleton returns for treatment follow-up, to be seen prior to cycle 3 of chemotherapy with carboplatin and paclitaxel Her treatment is delayed by 1 week due to recent hospitalization for GI bleed I have reviewed blood work, CT imaging and documentation at [facility] Brittany Middleton is doing well Brittany Middleton has very minor  peripheral neuropathy that comes and goes Denies abdominal pain, bloating or recent changes in bowel habits No further bleeding since recent discharge from [facility]:   Constitutional: Denies fevers, chills or abnormal weight loss Eyes: Denies blurriness of vision Ears, nose, mouth, throat, and face: Denies mucositis or sore throat Respiratory: Denies cough, dyspnea or wheezes Cardiovascular: Denies palpitation, chest discomfort or lower extremity swelling Gastrointestinal:  Denies nausea, heartburn or change in bowel habits Skin: Denies abnormal skin rashes Behavioral/Psych: Mood is stable, no new changes  All other systems were reviewed with the patient and are negative.  I have reviewed the past medical history, past surgical history, social history and family history with the patient and they are unchanged from previous note.  ALLERGIES:  is allergic to macrobid [nitrofurantoin], ciprofloxacin, codeine, and azithromycin.  MEDICATIONS:  Current Outpatient Medications  Medication Sig Dispense Refill   acetaminophen (TYLENOL) 500 MG tablet Take 1,000 mg by mouth every 6 (six) hours as needed for mild pain, moderate pain, fever or headache.     albuterol (PROVENTIL) (2.5 MG/3ML) 0.083% nebulizer solution Take 2.5 mg by nebulization every 6 (six) hours as needed for wheezing or shortness of breath.     alclomethasone (ACLOVATE) 0.05 % ointment Apply 1 application topically 2 (two) times daily.     BIOTIN PO Take 1 tablet by mouth daily.     Cholecalciferol (VITAMIN D-3) 25 MCG (1000 UT) CAPS Take 1,000 Units by mouth daily.     co-enzyme Q-10 30 MG capsule Take 30 mg by mouth daily.     dexamethasone (DECADRON) 4 MG tablet Take 2 tabs at the night  before and 2 tab the morning of chemotherapy, every 3 weeks, by mouth x 6 cycles 36 tablet 6   estradiol (ESTRACE) 0.1 MG/GM vaginal cream Place 1 Applicatorful vaginally 3 (three) times a week.     lidocaine-prilocaine (EMLA)  cream Apply to affected area once 30 g 3   losartan (COZAAR) 50 MG tablet Take 1 tablet (50 mg total) by mouth daily. 30 tablet 2   Melatonin 5 MG CHEW Chew 5 mg by mouth at bedtime.      Multiple Vitamins-Minerals (MULTIVITAMIN WITH MINERALS) tablet Take 1 tablet by mouth daily.     ondansetron (ZOFRAN) 8 MG tablet Take 1 tablet (8 mg total) by mouth every 8 (eight) hours as needed for refractory nausea / vomiting. 30 tablet 1   pantoprazole (PROTONIX) 40 MG tablet Take 1 tablet (40 mg total) by mouth daily. 30 tablet 1   polyethylene glycol (MIRALAX / GLYCOLAX) 17 g packet Take 8.5 g by mouth 2 (two) times daily.     rosuvastatin (CRESTOR) 20 MG tablet TAKE ONE TABLET BY MOUTH ONE TIME DAILY 90 tablet 0   No current facility-administered medications for this visit.   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 - Diverticulosis in the sigmoid colon and in the descending colon. - No specimens collected. - Blood in stool without cause found on endoscopic exam   05/08/2020 Imaging   1. Bladder is decompressed though demonstrates significant perivesicular hazy stranding, mucosal hyperemia and edematous mural thickening. Findings are suggestive of cystitis. Correlate with urinalysis. No abnormal perinephric or periureteral stranding or other features to  suggest an ascending tract infection at this time. 2. Colonic diverticulosis without evidence of acute diverticulitis. 3. Aortic Atherosclerosis (ICD10-I70.0).   12/28/2020 Imaging   1. 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 Differential for peritoneal carcinomatosis is primarily neoplastic and includes cancers of the ovary, appendix, colon, pancreas and stomach. PET-CT may be helpful for further characterization. Tissue sampling of the mesentery may be eventually required for diagnosis. 2. Mildly distended small bowel loops throughout the abdomen and pelvis, with fluid and associated air-fluid levels throughout the nondistended small bowel, suggesting a mild ileus versus partial small bowel obstruction. Favor partial small bowel obstruction with transition zone in the RIGHT lower quadrant, likely related to aforementioned neoplastic peritoneal implants and/or adhesions. 3. Small free fluid within the RIGHT upper quadrant and LEFT upper quadrant. No abscess collection seen. No free intraperitoneal air seen. 4. Extensive colonic diverticulosis without evidence of acute diverticulitis. 5. Small chronic pleural effusions with associated atelectasis.   12/29/2020 Tumor Marker   Patient's tumor was tested for the following markers: CA-125. Results of the tumor marker test revealed 95.1.   12/30/2020 Surgery  Preoperative diagnosis: abdominal nodules   Postoperative diagnosis: same   Procedure: diagnostic laparoscopy with abdominal wall biopsy   Surgeon: Gurney Maxin, M.D.    Indications for procedure: Brittany Middleton is a 70 y.o. year old female with symptoms of nausea, vomiting, and abdominal pain. Work up was concerning for cancer with abdominal wall studding.   Description of procedure: The patient was brought into the operative  suite. Anesthesia was administered with General endotracheal anesthesia. WHO checklist was applied. The patient was then placed in supine position. The area was prepped and draped in the usual sterile fashion.   A small left subcostal incision was made. A 3m trocar was used to gain access to the peritoneal cavity by optical entry technique. Pneumoperitoneum was applied with a high flow and low pressure. The laparoscope was reinserted to confirm position.   On initial visualization of the abdomen, there were multiple nodules throughout the abdominal wall. There were multiple nodules over the small intestine and mesentery. There was some loops of small intestine that were matted together. There was a large white mass in the left upper quadrant. Multiple areas of peritoneum were removed with harmonic scalpel. The large mass was inspected. It appeared to be related to the colon and omentum. Grasper was used to remove some superficial tissue and was sent for culture.   The abdomen was desufflated. The incisions were closed with 4-0 monocryl subcuticular stitch.   12/30/2020 Pathology Results   FINAL MICROSCOPIC DIAGNOSIS:   A. ABDOMINAL WALL, NODULE, EXCISION:  -  Metastatic adenocarcinoma  -  See comment   B. PERI COLONIC EXUDATE, EXCISION:  -  Metastatic adenocarcinoma  -  See comment   COMMENT:   By immunohistochemistry, the neoplastic cells are positive for cytokeratin 7, PAX8 and WT1 but negative for cytokeratin 20, CDX2, p53 and TTF-1.  The morphology and immunophenotype are consistent with a gynecologic primary.     01/01/2021 Initial Diagnosis   Peritoneal carcinomatosis ([place]   01/08/2021 Cancer Staging   Staging form: Ovary, Fallopian Tube, and Primary Peritoneal Carcinoma, AJCC 8th Edition - Clinical stage from 01/08/2021: cT3, cN0, cM0 - Signed by GHeath Lark MD on 01/08/2021 Stage prefix: Initial diagnosis   01/12/2021 -  Chemotherapy    Patient is on Treatment Plan: OVARIAN  CARBOPLATIN (AUC 6) / PACLITAXEL (175) Q21D X 6 CYCLES       01/17/2021 Imaging   MRI brain No evidence of intracranial metastatic disease or other acute abnormality   02/12/2021 Genetic Testing   No pathogenic variants detected in [facility] [facility].  The report date is February 12, 2021.   The [facility] panel offered by [employer] sequencing and deletion/duplication testing of the following 48 genes: APC, ATM, AXIN2, BAP1, BARD1, BMPR1A, BRCA1, BRCA2, BRIP1, CHD1, CDK4, CDKN2A(p16 and p14ARF), CHEK2, CTNNA1, EGFR, EPCAM, FH, FLCN, GREM1, HOXB13, MEN1, MET, MITF , MLH1, MSH2, MSH3, MSH6, MUTYH, NTHL1, PALB2, PMS2, POLD1, POLE, PTEN, RAD51C, RAD51D, RET, SDHA, SDHB, SDHC, SDHD, SMAD4, STK11,TERT, TP53, TSC1, TSC2, and VHL.  Unless otherwise specified, all coding regions and flanking non-coding regions are analyzed for sequence variation. Analysis of flanking intronic regions typically do not extend more than 20 bp before and 10 bp after each exon, though the exact region may be adjusted based on the presence of either potentially significant variants or highly repetitive sequences. Coding regions and proximal promoter regions near the transcription start sites are analyzed for large deletions or duplications. Specific genes are tested only  for sequence and/or CNVs within limited regions. Limited clinically relevant regions are included for EGFR (sequencing and CNV analysis of exons 18-21),RET (sequencing and CNV analysis of exons 5, 8, 10, 11, and 13-16), and MITF (sequencing of position c.952). Only CNV analysis of the last two exons of EPCAM is performed. CNV analysis of GREM1 includes the upstream region overlapping the adjacent gene SCG5. MSH3 exon 1 contains a long polyalanine repeat that can interfere with variant calling; therefore, MSH3 analysis excludes c.121 to c.237. Only sequence analysis of the exons encompassing the exonuclease domains of these genes is  performed (POLD1 c.841 to c.1686, POLE c.802 to c.1473). Limited promoter regions in selected genes undergo sequence analysis including TERT (c.-71 to c.-1), and APC Promoter 1B (c.-195 to c.-190 and c.-125 ([id number]).   HRD testing pending.    03/02/2021 Imaging   Outside imaging at [facility] improved peritoneal carcinomatosis. No evidence of new metastatic disease or acute abdominal pathology.    03/02/2021 Imaging   Outside CT imaging done at [facility] improved peritoneal carcinomatosis. No evidence of new metastatic disease or acute abdominal pathology   03/02/2021 - 03/03/2021 Hospital Admission   Brittany Middleton was hospitalized briefly for evaluation of hematochezia at [facility].  Brittany Middleton underwent CT imaging and colonoscopy and was subsequently discharged   03/03/2021 Procedure   Colonoscopy was performed at [facility]  Findings: Diverticula were found in the entire colon. Internal hemorrhoids were found during retroflexion.  The hemorrhoids were Grade I (internal hemorrhoids that do not prolapse). Impression: - Diverticulosis in the entire examined colon. Likely source of bleeding. - Internal hemorrhoids.     PHYSICAL EXAMINATION: ECOG PERFORMANCE STATUS: 1 - Symptomatic but completely ambulatory  Vitals:   03/06/21 1231  BP: (!) 165/47  Pulse: 60  Resp: 17  Temp: 98.4 F (36.9 C)  SpO2: 99%   Filed Weights   03/06/21 1231  Weight: 242 lb 3.2 oz (109.9 kg)    GENERAL:alert, no distress and comfortable SKIN: skin color, texture, turgor are normal, no rashes or significant lesions EYES: normal, Conjunctiva are pink and non-injected, sclera clear OROPHARYNX:no exudate, no erythema and lips, buccal mucosa, and tongue normal  NECK: supple, thyroid normal size, non-tender, without nodularity LYMPH:  no palpable lymphadenopathy in the cervical, axillary or inguinal LUNGS: clear to auscultation and percussion with normal breathing effort HEART: regular rate & rhythm and no murmurs  and no lower extremity edema ABDOMEN:abdomen soft, non-tender and normal bowel sounds Musculoskeletal:no cyanosis of digits and no clubbing  NEURO: alert & oriented x 3 with fluent speech, no focal motor/sensory deficits  LABORATORY DATA:  I have reviewed the data as listed    Component Value Date/Time   NA 144 03/06/2021 1307   NA 143 12/20/2019 0829   K 4.1 03/06/2021 1307   CL 106 03/06/2021 1307   CO2 28 03/06/2021 1307   GLUCOSE 99 03/06/2021 1307   BUN 12 03/06/2021 1307   BUN 16 12/20/2019 0829   CREATININE 0.66 03/06/2021 1307   CALCIUM 9.3 03/06/2021 1307   PROT 7.0 03/06/2021 1307   PROT 6.4 02/04/2020 1036   ALBUMIN 3.9 03/06/2021 1307   ALBUMIN 4.2 02/04/2020 1036   AST 22 03/06/2021 1307   ALT 21 03/06/2021 1307   ALKPHOS 65 03/06/2021 1307   BILITOT 0.5 03/06/2021 1307   GFRNONAA >60 03/06/2021 1307   GFRAA 104 12/20/2019 0829    No results found for: SPEP, UPEP  Lab Results  Component Value Date   WBC 3.7 (  L) 03/06/2021   NEUTROABS 2.0 03/06/2021   HGB 10.5 (L) 03/06/2021   HCT 32.5 (L) 03/06/2021   MCV 92.1 03/06/2021   PLT 141 (L) 03/06/2021      Chemistry      Component Value Date/Time   NA 144 03/06/2021 1307   NA 143 12/20/2019 0829   K 4.1 03/06/2021 1307   CL 106 03/06/2021 1307   CO2 28 03/06/2021 1307   BUN 12 03/06/2021 1307   BUN 16 12/20/2019 0829   CREATININE 0.66 03/06/2021 1307      Component Value Date/Time   CALCIUM 9.3 03/06/2021 1307   ALKPHOS 65 03/06/2021 1307   AST 22 03/06/2021 1307   ALT 21 03/06/2021 1307   BILITOT 0.5 03/06/2021 1307     I have reviewed outside CT imaging

## 2021-03-06 NOTE — Assessment & Plan Note (Signed)
I have reviewed documentation from [place] I was able to review the imaging CT myself and agree with interpretation Her tumor marker is now normal We will proceed with chemotherapy as scheduled next week She will return to [facility] for preop imaging study and tentative plan for interval debulking surgery on October 18 I plan to see her back the Friday after Thanksgiving to resume treatment

## 2021-03-09 ENCOUNTER — Inpatient Hospital Stay: Payer: Medicare Other | Admitting: Hematology and Oncology

## 2021-03-09 ENCOUNTER — Other Ambulatory Visit: Payer: Medicare Other

## 2021-03-09 ENCOUNTER — Inpatient Hospital Stay: Payer: Medicare Other

## 2021-03-10 ENCOUNTER — Ambulatory Visit: Payer: Medicare Other | Admitting: Rehabilitation

## 2021-03-10 ENCOUNTER — Other Ambulatory Visit: Payer: Self-pay

## 2021-03-10 ENCOUNTER — Encounter: Payer: Self-pay | Admitting: Rehabilitation

## 2021-03-10 DIAGNOSIS — M6281 Muscle weakness (generalized): Secondary | ICD-10-CM

## 2021-03-10 DIAGNOSIS — R293 Abnormal posture: Secondary | ICD-10-CM | POA: Diagnosis not present

## 2021-03-10 DIAGNOSIS — C762 Malignant neoplasm of abdomen: Secondary | ICD-10-CM

## 2021-03-10 DIAGNOSIS — C786 Secondary malignant neoplasm of retroperitoneum and peritoneum: Secondary | ICD-10-CM

## 2021-03-10 MED FILL — Dexamethasone Sodium Phosphate Inj 100 MG/10ML: INTRAMUSCULAR | Qty: 1 | Status: AC

## 2021-03-10 MED FILL — Fosaprepitant Dimeglumine For IV Infusion 150 MG (Base Eq): INTRAVENOUS | Qty: 5 | Status: AC

## 2021-03-10 NOTE — Patient Instructions (Signed)
Access Code: [email or web address]  URL: [email or web address]: 09/20/2022Prepared by: Marcene Brawn TevisExercises  Hooklying Transversus Abdominis Palpation - 1 x daily - 7 x weekly - 1 sets - 10 reps - 5 second hold  Supine March - 1 x daily - 7 x weekly - 1 sets - 10 reps - no hold  Supine Bridge - 1 x daily - 7 x weekly - 1 sets - 10 reps - no hold  Supine Active Straight Leg Raise - 1 x daily - 7 x weekly - 1 sets - 10 reps - no hold  Supine Chest Press with Dumbbells - 1 x daily - 2-3 x weekly - 1-2 sets - 8-10 reps - no hold  Standing Hip Abduction with Counter Support - 1 x daily - 7 x weekly - 1 sets - 10 reps - no hold  Mini Squat with Counter Support - 1 x daily - 7 x weekly - 1 sets - 10 reps - no hold

## 2021-03-10 NOTE — Therapy (Signed)
[place], [place], [address] Phone: [phone]   Fax:  [phone]  Physical Therapy Treatment  Patient Details  Name: Brittany Middleton MRN: [id number] Date of Birth: 17-Oct-1950 Referring Provider (PT): Dr. 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   Abdominal pain 05/28/2020   Acute blood loss anemia 07/17/2018   Anemia 07/17/2018   Arthritis    Ascending aortic aneurysm ([place]) 05/23/2019   Asthma    Cancer ([place])    Cervical disc disease    MRI 2016   Chest pain with moderate risk for cardiac etiology 02/27/2018   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   Coronary artery aneurysm 03/03/2018   Diplopia 01/09/2021   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 Last Assessment & Plan:  Formatting of this note might be different from the original. - continue home meds   Family history of breast cancer 01/27/2021   Family history of lung cancer 01/27/2021   Gastrointestinal bleeding 07/25/2018   Gastrointestinal hemorrhage with melena 07/16/2018   Genetic testing 02/13/2021   No pathogenic variants detected in [facility].  The report date is February 12, 2021.   [facility] gene panel offered by [facility] includes sequencing and deletion/duplication testing of the following 48 genes: APC, ATM, AXIN2, BAP1,  BARD1, BMPR1A, BRCA1, BRCA2, BRIP1, CHD1, CDK4, CDKN2A(p16 and p14ARF), CHEK2, CTNNA1, EGFR, EPCAM, FH, FLCN, GREM1, HOXB13, MEN1, M   GERD (gastroesophageal reflux disease)    Hematuria 06/19/2020   Hepatic steatosis    per imaging 2016, 2019   Hiatal hernia    History of palpitations    Holter monitor, 2017: NSR, occasional PVC, PACs, no arrhythmia   HLD (hyperlipidemia) 02/27/2018   HTN (hypertension) 02/27/2018   Hypercholesteremia    Hypercholesterolemia 04/07/2017   Hyperglycemia 06/19/2020   Hypertension    Hypertensive disorder 04/07/2017   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   Hypokalemia 07/17/2018   Lumbar disc disease    MRI, 2017    Obesity, Class III, BMI 40-49.9 (morbid obesity) ([place]) 05/22/2018   Overweight 03/03/2018   Palpitations 08/19/2015   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[place])    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   Physical debility 01/10/2021   Pneumonia    Pure hypercholesterolemia, unspecified 04/07/2017   Pyelonephritis 03/30/2020   Recurrent UTI 05/28/2020   SBO (small bowel obstruction) ([place]) 12/28/2020   Sepsis secondary to UTI ([place]) 04/25/2020   Severe sepsis ([place]) 05/21/2020   Severe sepsis with acute organ dysfunction ([place]) 04/25/2020   Skin infection 02/05/2021   Skin irritation 02/05/2021   Sleep apnea    Urinary tract infection due to extended-spectrum beta lactamase (ESBL) producing Escherichia coli    Uterine cancer ([place]) 01/01/2021    Past Surgical History:  Procedure  Laterality Date   BLADDER REPAIR     BREAST EXCISIONAL BIOPSY Left    CARPAL TUNNEL RELEASE Right    CATARACT EXTRACTION, BILATERAL  2014   COLONOSCOPY WITH PROPOFOL N/A 07/18/2018   Procedure: COLONOSCOPY WITH PROPOFOL;  Surgeon: Laurence Spates, MD;  Location: [facility];  Service: Endoscopy;  Laterality: N/A;   ESOPHAGOGASTRODUODENOSCOPY (EGD) WITH PROPOFOL N/A 07/17/2018   Procedure: ESOPHAGOGASTRODUODENOSCOPY (EGD) WITH PROPOFOL;  Surgeon: Laurence Spates, MD;  Location: [facility];  Service: Endoscopy;  Laterality: N/A;   LAPAROSCOPY N/A 12/30/2020   Procedure: LAPAROSCOPY DIAGNOSTIC; WITH  BIOPSY OF ABDOMINAL WALL NODULES AND BIOPSY OF PERICOLONIC EXUDATE;  Surgeon: Kinsinger, Arta Bruce, MD;  Location: [facility];  Service: General;  Laterality: N/A;   PORTACATH PLACEMENT N/A 01/09/2021   Procedure: PORT INSERTION WITH [place] & FLUORO;  Surgeon: Kieth Brightly Arta Bruce, MD;  Location: [facility];  Service: General;  Laterality: N/A;   REPAIR RECTOCELE     TOE SURGERY Left     There were no vitals filed for this visit.   Subjective Assessment - 03/10/21 0959     Subjective I already walked 0.2 of a mile today.  I don't think I could do any more today (of TM)  I would love to learn exercises for my core, hips, and shoulders    Pertinent History Diagnosed on 12/30/2020 with metastatic adenocarcinoma with gynecological primary. She is presently undergoing chemotherapy with Carboplatin and Paclitaxel every 3 weeeks and has 3 more cycles.  Planning complete hysterectomy for 03/24/21-03/26/2021 and likely to resume chemo on 04/13/2021 if all goes well    Currently in Pain? No/denies                               [facility] Adult PT Treatment/Exercise - 03/10/21 0001       Self-Care   Self-Care Other Self-Care Comments    Other Self-Care Comments  lengthy discussion about exercise during chemotherapy - expectations, benefits etc. and how to incorporate core work into ADLs      Exercises   Exercises Knee/Hip;Other Exercises;Lumbar    Other Exercises  chest press 2x8 3#x8 and then 2# x 8 supine      Lumbar Exercises: Supine   Ab Set 5 reps;5 seconds    AB Set Limitations with cueing to not hold breath - added to HEP    Bent Knee Raise 10 reps    Bent Knee Raise Limitations with TrA activation - added to HEP    Bridge 5 reps    Bridge Limitations added to HEP    Straight Leg Raise 5 reps    Straight Leg Raises Limitations bil - added to HEP      Knee/Hip  Exercises: Standing   Heel Raises Both    Heel Raises Limitations 10    Hip Abduction Both;10 reps    Abduction Limitations back of the chair    Hip Extension Both;10 reps    Extension Limitations back of the chair    Functional Squat 10 reps                          PT Long Term Goals - 02/20/21 2016       PT LONG TERM GOAL #1   Title Pt will be independent with HEP for generalized stregth, flexibility and balance training    Time 4    Period Weeks  Status New    Target Date 03/20/21      PT LONG TERM GOAL #2   Title Pt will perform 12 sit to stands in 30 seconds for decreased risk of falls    Baseline 9    Time 4    Period Weeks    Status New    Target Date 03/20/21      PT LONG TERM GOAL #3   Title Pt will be able to maintain bilateral SLS for greater than 10 seconds each without excessive use of arms to maintain balance    Time 4    Period Weeks    Status New    Target Date 03/20/21      PT LONG TERM GOAL #4   Title Pt will report  improved heel toe gait pattern after walking short periods    Time 4    Period Weeks    Status New    Target Date 03/20/21                   Plan - 03/10/21 1049     Clinical Impression Statement Pt returns for first PT session to learn HEP for hip, core, and shoulder strength and endurance.  Performed each exercise per notes with handout for pt performance and home.  Pt tolerated all well.  Did not perofrm walking on TM due to pt just going on a walk.  Pt is motivated to keep strength and moblity for after surgery.  Will return next Wed for review of HEP    PT Frequency 2x / week    PT Duration 4 weeks    PT Treatment/Interventions ADLs/Self Care Home Management;Stair training;Gait training;Therapeutic activities;Therapeutic exercise;Balance training;Neuromuscular re-education;Manual techniques;Patient/family education    PT Next Visit Plan review HEP; perform and add as needed    Consulted and Agree with  Plan of Care Patient             Patient will benefit from skilled therapeutic intervention in order to improve the following deficits and impairments:     Visit Diagnosis: Muscle weakness (generalized)  Abnormal posture  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[place])  Malignant neoplasm of abdomen ([place])     Problem List Patient Active Problem List   Diagnosis Date Noted   Pancytopenia, acquired ([place]) 03/06/2021   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   Coronary artery calcification 03/05/2021   Arthritis 03/04/2021   Cancer ([place]) 03/04/2021   GERD (gastroesophageal reflux disease) 03/04/2021   Pneumonia 03/04/2021   Genetic testing 02/13/2021   Family history of breast cancer 01/27/2021   Family history of lung cancer 01/27/2021   Dysuria 01/26/2021   Physical debility 01/10/2021   Diplopia 01/09/2021   Uterine cancer ([place]) 01/01/2021   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   SBO (small bowel obstruction) ([place]) 12/28/2020   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   Dyspnea 06/19/2020   Epigastric pain 06/19/2020   Hematuria 06/19/2020   Hyperglycemia 06/19/2020   Abdominal pain 05/28/2020   Recurrent UTI 05/28/2020   Severe sepsis ([place]) 05/21/2020   Asthma    Sleep apnea    Severe sepsis with acute organ dysfunction ([place]) 04/25/2020   Sepsis secondary to UTI ([place]) 04/25/2020   Cervical disc disease    Hepatic steatosis    History of palpitations    Hypercholesteremia    Hypertension    Lumbar disc disease    Pyelonephritis  03/30/2020   Gastrointestinal bleeding 07/25/2018   Hiatal hernia    Anemia 07/17/2018   Acute blood loss anemia 07/17/2018   Hypokalemia 07/17/2018   Gastrointestinal hemorrhage with melena 07/16/2018   Obesity, Class III, BMI 40-49.9 (morbid obesity) ([place]) 05/22/2018   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

## 2021-03-11 ENCOUNTER — Encounter (HOSPITAL_COMMUNITY): Payer: Self-pay | Admitting: Hematology and Oncology

## 2021-03-11 ENCOUNTER — Encounter: Payer: Self-pay | Admitting: Hematology and Oncology

## 2021-03-11 ENCOUNTER — Inpatient Hospital Stay: Payer: Medicare Other

## 2021-03-11 VITALS — BP 140/83 | HR 72 | Temp 98.9°F | Resp 18 | Wt 242.5 lb

## 2021-03-11 DIAGNOSIS — C786 Secondary malignant neoplasm of retroperitoneum and peritoneum: Secondary | ICD-10-CM

## 2021-03-11 DIAGNOSIS — C482 Malignant neoplasm of peritoneum, unspecified: Secondary | ICD-10-CM | POA: Diagnosis not present

## 2021-03-11 DIAGNOSIS — C762 Malignant neoplasm of abdomen: Secondary | ICD-10-CM

## 2021-03-11 MED ORDER — DIPHENHYDRAMINE HCL 50 MG/ML IJ SOLN
50.0000 mg | Freq: Once | INTRAMUSCULAR | Status: AC
  Administered 2021-03-11: 50 mg via INTRAVENOUS
  Filled 2021-03-11: qty 1

## 2021-03-11 MED ORDER — SODIUM CHLORIDE 0.9 % IV SOLN
150.0000 mg | Freq: Once | INTRAVENOUS | Status: AC
  Administered 2021-03-11: 150 mg via INTRAVENOUS
  Filled 2021-03-11: qty 150
  Filled 2021-03-11: qty 5

## 2021-03-11 MED ORDER — SODIUM CHLORIDE 0.9% FLUSH
10.0000 mL | INTRAVENOUS | Status: DC | PRN
  Administered 2021-03-11: 10 mL

## 2021-03-11 MED ORDER — SODIUM CHLORIDE 0.9 % IV SOLN
710.0000 mg | Freq: Once | INTRAVENOUS | Status: AC
  Administered 2021-03-11: 710 mg via INTRAVENOUS
  Filled 2021-03-11: qty 71

## 2021-03-11 MED ORDER — FAMOTIDINE 20 MG IN NS 100 ML IVPB
20.0000 mg | Freq: Once | INTRAVENOUS | Status: AC
  Administered 2021-03-11: 20 mg via INTRAVENOUS
  Filled 2021-03-11: qty 100

## 2021-03-11 MED ORDER — SODIUM CHLORIDE 0.9 % IV SOLN: Freq: Once | INTRAVENOUS | Status: AC

## 2021-03-11 MED ORDER — SODIUM CHLORIDE 0.9 % IV SOLN
10.0000 mg | Freq: Once | INTRAVENOUS | Status: AC
  Administered 2021-03-11: 10 mg via INTRAVENOUS
  Filled 2021-03-11: qty 10
  Filled 2021-03-11: qty 1

## 2021-03-11 MED ORDER — HEPARIN SOD (PORK) LOCK FLUSH 100 UNIT/ML IV SOLN
500.0000 [IU] | Freq: Once | INTRAVENOUS | Status: AC | PRN
  Administered 2021-03-11: 500 [IU]

## 2021-03-11 MED ORDER — SODIUM CHLORIDE 0.9 % IV SOLN
175.0000 mg/m2 | Freq: Once | INTRAVENOUS | Status: AC
  Administered 2021-03-11: 330 mg via INTRAVENOUS
  Filled 2021-03-11: qty 55

## 2021-03-11 MED ORDER — PALONOSETRON HCL INJECTION 0.25 MG/5ML
0.2500 mg | Freq: Once | INTRAVENOUS | Status: AC
  Administered 2021-03-11: 0.25 mg via INTRAVENOUS
  Filled 2021-03-11: qty 5

## 2021-03-11 NOTE — Patient Instructions (Signed)
[facility]COL[facility]  Discharge Instructions: Thank you for choosing [facility] to provide your oncology and hematology care.   If you have a lab appointment with the [facility], please go directly to the [facility] and check in at the registration area.   Wear comfortable clothing and clothing appropriate for easy access to any Portacath or PICC line.   We strive to give you quality time with your provider. You may need to reschedule your appointment if you arrive late (15 or more minutes).  Arriving late affects you and other patients whose appointments are after yours.  Also, if you miss three or more appointments without notifying the office, you may be dismissed from the clinic at the provider's discretion.      For prescription refill requests, have your pharmacy contact our office and allow 72 hours for refills to be completed.    Today you received the following chemotherapy and/or immunotherapy agents : Pacltaxel, Carbplatin      To help prevent nausea and vomiting after your treatment, we encourage you to take your nausea medication as directed.  BELOW ARE SYMPTOMS THAT SHOULD BE REPORTED IMMEDIATELY: *FEVER GREATER THAN 100.4 F (38 C) OR HIGHER *CHILLS OR SWEATING *NAUSEA AND VOMITING THAT IS NOT CONTROLLED WITH YOUR NAUSEA MEDICATION *UNUSUAL SHORTNESS OF BREATH *UNUSUAL BRUISING OR BLEEDING *URINARY PROBLEMS (pain or burning when urinating, or frequent urination) *BOWEL PROBLEMS (unusual diarrhea, constipation, pain near the anus) TENDERNESS IN MOUTH AND THROAT WITH OR WITHOUT PRESENCE OF ULCERS (sore throat, sores in mouth, or a toothache) UNUSUAL RASH, SWELLING OR PAIN  UNUSUAL VAGINAL DISCHARGE OR ITCHING   Items with * indicate a potential emergency and should be followed up as soon as possible or go to the Emergency Department if any problems should occur.  Please show the CHEMOTHERAPY ALERT CARD or IMMUNOTHERAPY ALERT CARD at  check-in to the Emergency Department and triage nurse.  Should you have questions after your visit or need to cancel or reschedule your appointment, please contact [facility]  Dept: [phone]  and follow the prompts.  Office hours are 8:00 a.m. to 4:30 p.m. Monday - Friday. Please note that voicemails left after 4:00 p.m. may not be returned until the following business day.  We are closed weekends and major holidays. You have access to a nurse at all times for urgent questions. Please call the main number to the clinic Dept: [phone] and follow the prompts.   For any non-urgent questions, you may also contact your provider using MyChart. We now offer e-Visits for anyone 61 and older to request care online for non-urgent symptoms. For details visit [email or web address].   Also download the MyChart app! Go to the app store, search "MyChart", open the app, select [facility], and log in with your MyChart username and password.  Due to Covid, a mask is required upon entering the hospital/clinic. If you do not have a mask, one will be given to you upon arrival. For doctor visits, patients may have 1 support person aged 9 or older with them. For treatment visits, patients cannot have anyone with them due to current Covid guidelines and our immunocompromised population.

## 2021-03-12 ENCOUNTER — Encounter: Payer: Self-pay | Admitting: *Deleted

## 2021-03-13 ENCOUNTER — Other Ambulatory Visit: Payer: Self-pay | Admitting: Hematology and Oncology

## 2021-03-13 MED ORDER — OXYCODONE HCL 5 MG PO TABS: 5.0000 mg | ORAL_TABLET | ORAL | 0 refills | Status: DC | PRN

## 2021-03-26 ENCOUNTER — Encounter: Payer: Self-pay | Admitting: Hematology and Oncology

## 2021-03-26 NOTE — Progress Notes (Addendum)
HPI:  Brittany Middleton was previously seen in the [place] clinic due to a personal history of cancer and concerns regarding a hereditary predisposition to cancer. Please refer to our prior cancer genetics clinic note for more information regarding our discussion, assessment and recommendations, at the time. [name] recent genetic test results were disclosed to her, as were recommendations warranted by these results. These results and recommendations are discussed in more detail below.  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 - Diverticulosis in the sigmoid colon and in the descending colon. - No specimens collected. - Blood in stool without cause found on endoscopic exam   05/08/2020 Imaging   1. Bladder is decompressed though demonstrates significant perivesicular hazy stranding, mucosal hyperemia and edematous mural thickening. Findings are suggestive of cystitis. Correlate with urinalysis. No abnormal perinephric or periureteral stranding or other features to suggest an ascending tract infection at this time. 2. Colonic diverticulosis without evidence of acute diverticulitis. 3. Aortic Atherosclerosis (ICD10-I70.0).   12/28/2020 Imaging   1. 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 Differential for peritoneal carcinomatosis is primarily neoplastic and includes cancers of the ovary, appendix, colon, pancreas and stomach. PET-CT may be helpful for further  characterization. Tissue sampling of the mesentery may be eventually required for diagnosis. 2. Mildly distended small bowel loops throughout the abdomen and pelvis, with fluid and associated air-fluid levels throughout the nondistended small bowel, suggesting a mild ileus versus partial small bowel obstruction. Favor partial small bowel obstruction with transition zone in the RIGHT lower quadrant, likely related to aforementioned neoplastic peritoneal implants and/or adhesions. 3. Small free fluid within the RIGHT upper quadrant and LEFT upper quadrant. No abscess collection seen. No free intraperitoneal air seen. 4. Extensive colonic diverticulosis without evidence of acute diverticulitis. 5. Small chronic pleural effusions with associated atelectasis.   12/29/2020 Tumor Marker   Patient's tumor was tested for the following markers: CA-125. Results of the tumor marker test revealed 95.1.   12/30/2020 Surgery   Preoperative diagnosis: abdominal nodules   Postoperative diagnosis: same   Procedure: diagnostic laparoscopy with abdominal wall biopsy   Surgeon: Gurney Maxin, M.D.    Indications for procedure: Brittany Middleton is a 70 y.o. year old female with symptoms of nausea, vomiting, and abdominal pain. Work up was concerning for cancer with abdominal wall studding.   Description of procedure: The patient was brought into the operative suite. Anesthesia was administered with General endotracheal anesthesia. WHO checklist was applied. The patient was then placed in supine position. The area was prepped and draped in the usual sterile fashion.   A small left subcostal incision was made. A 47mm trocar was used to gain access to the peritoneal cavity by optical entry technique. Pneumoperitoneum was applied with a high flow and low pressure. The laparoscope was reinserted to confirm position.   On initial visualization of the abdomen, there were multiple nodules throughout the abdominal wall.  There were multiple nodules over the small intestine and mesentery. There was some loops of small intestine  that were matted together. There was a large white mass in the left upper quadrant. Multiple areas of peritoneum were removed with harmonic scalpel. The large mass was inspected. It appeared to be related to the colon and omentum. Grasper was used to remove some superficial tissue and was sent for culture.   The abdomen was desufflated. The incisions were closed with 4-0 monocryl subcuticular stitch.   12/30/2020 Pathology Results   FINAL MICROSCOPIC DIAGNOSIS:   A. ABDOMINAL WALL, NODULE, EXCISION:  -  Metastatic adenocarcinoma  -  See comment   B. PERI COLONIC EXUDATE, EXCISION:  -  Metastatic adenocarcinoma  -  See comment   COMMENT:   By immunohistochemistry, the neoplastic cells are positive for cytokeratin 7, PAX8 and WT1 but negative for cytokeratin 20, CDX2, p53 and TTF-1.  The morphology and immunophenotype are consistent with a gynecologic primary.     01/01/2021 Initial Diagnosis   Peritoneal carcinomatosis ([place])   01/08/2021 Cancer Staging   Staging form: Ovary, Fallopian Tube, and Primary Peritoneal Carcinoma, AJCC 8th Edition - Clinical stage from 01/08/2021: cT3, cN0, cM0 - Signed by [name], MD on 01/08/2021 Stage prefix: Initial diagnosis   01/12/2021 -  Chemotherapy    Patient is on Treatment Plan: OVARIAN CARBOPLATIN (AUC 6) / PACLITAXEL (175) Q21D X 6 CYCLES       01/17/2021 Imaging   MRI brain No evidence of intracranial metastatic disease or other acute abnormality   02/12/2021 Genetic Testing   No pathogenic variants detected in [facility].  The report date is February 12, 2021.   The [facility] gene panel offered by [employer] includes sequencing and deletion/duplication testing of the following 48 genes: APC, ATM, AXIN2, BAP1, BARD1, BMPR1A, BRCA1, BRCA2, BRIP1, CHD1, CDK4, CDKN2A(p16 and p14ARF), CHEK2, CTNNA1, EGFR,  EPCAM, FH, FLCN, GREM1, HOXB13, MEN1, MET, MITF , MLH1, MSH2, MSH3, MSH6, MUTYH, NTHL1, PALB2, PMS2, POLD1, POLE, PTEN, RAD51C, RAD51D, RET, SDHA, SDHB, SDHC, SDHD, SMAD4, STK11,TERT, TP53, TSC1, TSC2, and VHL.  Unless otherwise specified, all coding regions and flanking non-coding regions are analyzed for sequence variation. Analysis of flanking intronic regions typically do not extend more than 20 bp before and 10 bp after each exon, though the exact region may be adjusted based on the presence of either potentially significant variants or highly repetitive sequences. Coding regions and proximal promoter regions near the transcription start sites are analyzed for large deletions or duplications. Specific genes are tested only for sequence and/or CNVs within limited regions. Limited clinically relevant regions are included for EGFR (sequencing and CNV analysis of exons 18-21),RET (sequencing and CNV analysis of exons 5, 8, 10, 11, and 13-16), and MITF (sequencing of position c.952). Only CNV analysis of the last two exons of EPCAM is performed. CNV analysis of GREM1 includes the upstream region overlapping the adjacent gene SCG5. MSH3 exon 1 contains a long polyalanine repeat that can interfere with variant calling; therefore, MSH3 analysis excludes c.121 to c.237. Only sequence analysis of the exons encompassing the exonuclease domains of these genes is performed (POLD1 c.841 to c.1686, POLE c.802 to c.1473). Limited promoter regions in selected genes undergo sequence analysis including TERT (c.-71 to c.-1), and APC Promoter 1B (c.-195 to c.-190 and c.-125 ([id number]).   HRD testing pending.    03/02/2021 Imaging   Outside imaging at [facility] improved peritoneal carcinomatosis. No evidence of new metastatic disease or acute abdominal pathology.    03/02/2021 Imaging   Outside CT imaging done at [facility]  [place] improved peritoneal carcinomatosis. No evidence of new metastatic disease or acute  abdominal pathology   03/02/2021 - 03/03/2021 Hospital Admission   She was hospitalized briefly for evaluation of hematochezia at [facility].  She underwent CT imaging and colonoscopy and was subsequently discharged   03/03/2021 Procedure   Colonoscopy was performed at [facility]  Findings: Diverticula were found in the entire colon. Internal hemorrhoids were found during retroflexion.  The hemorrhoids were Grade I (internal hemorrhoids that do not prolapse). Impression: - Diverticulosis in the entire examined colon. Likely source of bleeding. - Internal hemorrhoids.     FAMILY HISTORY:  We obtained a detailed, 4-generation family history.  Significant diagnoses are listed below: Family History  Problem Relation Age of Onset   CAD Mother    Breast cancer Mother 24   Lung cancer Father 76   Cancer Paternal 28        GYN cancer? bladder? d. early 68s   Cancer Paternal Aunt 55       GYN cancer   Lung cancer Paternal Grandfather        d. 50; smoking hx     Ms. Sayler is unaware of previous family history of genetic testing for hereditary cancer risks. There is no reported Ashkenazi Jewish ancestry. There is no known consanguinity.    GENETIC TEST RESULTS: Genetic testing reported out on February 12, 2021. The [facility] Panel found no pathogenic mutations. The [facility] gene panel offered by [employer] includes sequencing and deletion/duplication testing of the following 48 genes: APC, ATM, AXIN2, BAP1, BARD1, BMPR1A, BRCA1, BRCA2, BRIP1, CHD1, CDK4, CDKN2A(p16 and p14ARF), CHEK2, CTNNA1, EGFR, EPCAM, FH, FLCN, GREM1, HOXB13, MEN1, MET, MITF , MLH1, MSH2, MSH3, MSH6, MUTYH, NTHL1, PALB2, PMS2, POLD1, POLE, PTEN, RAD51C, RAD51D, RET, SDHA, SDHB, SDHC, SDHD, SMAD4, STK11,TERT, TP53, TSC1, TSC2, and VHL. The test report has been scanned into EPIC and is located under the Molecular Pathology section of the Results Review tab.  A portion of the result report is included below for  reference.       We discussed with Ms. Linebaugh that because current genetic testing is not perfect, it is possible there may be a gene mutation in one of these genes that current testing cannot detect, but that chance is small.  We also discussed, that there could be another gene that has not yet been discovered, or that we have not yet tested, that is responsible for the cancer diagnoses in the family. It is also possible there is a hereditary cause for the cancer in the family that Ms. Fehring did not inherit and therefore was not identified in her testing.  Therefore, it is important to remain in touch with cancer genetics in the future so that we can continue to offer Ms. Leis the most up to date genetic testing.   One goal of Ms. Fickling's genetic testing was to identify if she may be a candidate for targeted treatment with PARP inhibitors. Therefore, in addition to having germline genetic testing to analyze for inherited variants, Ms. Mccleary also had somatic testing for BRCA1/2.  No clinically significant mutations were detected in BRCA1/2. Because her test did not detect any pathogenic variants in her germline or somatic (tumor) testing at this time, there are no targeted treatment recommendations based on these results. However, the genomic instability score (GIS) was unable to be analyzed due to low tumor/tumor heterogeneity. It is possible that a new sample with the same or higher percentage of tumor, and  ideally from a new tumor block, may provide a passing result but success rate is difficult to determine due to tumor heterogeneity.  Sample may be re-submitted to [facility] after tumor debulking.    ADDITIONAL GENETIC TESTING: There are other genes that are associated with increased cancer risk that can be analyzed. Should Ms. Seelye wish to pursue additional genetic testing, we are happy to discuss and coordinate this testing, at any time.     CANCER SCREENING RECOMMENDATIONS: Ms. Dockstader test  result is considered negative (normal).  This means that we have not identified a hereditary cause for her personal history of cancer at this time. Most cancers are sporadic/familial, and this negative test suggests that her cancer may fall into this category.    This does not definitively rule out a hereditary predisposition to cancer. It is still possible that there could be genetic mutations that are undetectable by current technology. There could be genetic mutations in genes that have not been tested or identified to increase cancer risk.  Therefore, it is recommended she continue to follow the cancer management and screening guidelines provided by her oncology and primary healthcare provider.   An individual's cancer risk and medical management are not determined by genetic test results alone. Overall cancer risk assessment incorporates additional factors, including personal medical history, family history, and any available genetic information that may result in a personalized plan for cancer prevention and surveillance.   RECOMMENDATIONS FOR FAMILY MEMBERS:  Individuals in this family might be at some increased risk of developing cancer, over the general population risk, simply due to the family history of cancer.  We recommended women in this family have a yearly mammogram beginning at age 20, or 45 years younger than the earliest onset of cancer, an annual clinical breast exam, and perform monthly breast self-exams. Women in this family should also have a gynecological exam as recommended by their primary provider. Family members should be referred for colonoscopy starting at age 66, or earlier, as recommended by their providers. Family members should notify their providers of the family history of cancer.    FOLLOW-UP: Lastly, we discussed with Ms. Lormand that cancer genetics is a rapidly advancing field and it is possible that new genetic tests will be appropriate for her and/or her family  members in the future. We encouraged her to remain in contact with cancer genetics on an annual basis so we can update her personal and family histories and let her know of advances in cancer genetics that may benefit this family.   Our contact number was provided. Ms. Grimley questions were answered to her satisfaction, and she knows she is welcome to call us at anytime with additional questions or concerns.     Ryenne Lynam M. Joette Catching, [place], [facility] Genetic Counselor [email or web address] (P) [phone]

## 2021-04-13 ENCOUNTER — Telehealth: Payer: Self-pay

## 2021-04-13 NOTE — Telephone Encounter (Signed)
Pt called and LVM requesting appt for post surgical f/u with MD. Pt states surgery was at [facility] 10/18. Forwarded to MD to advise.

## 2021-04-13 NOTE — Telephone Encounter (Signed)
Ok! She's scheduled for 11/1 at 0900!

## 2021-04-13 NOTE — Telephone Encounter (Signed)
I can see her tomorrow from 1245 pm to 130 pm, 45 mins

## 2021-04-16 ENCOUNTER — Telehealth: Payer: Self-pay | Admitting: Oncology

## 2021-04-16 NOTE — Telephone Encounter (Signed)
Left a message for Dr. Blake Divine office asking for the pathology report from 04/07/21 surgery.  Requested a return call.

## 2021-04-17 ENCOUNTER — Telehealth: Payer: Self-pay | Admitting: Oncology

## 2021-04-17 NOTE — Telephone Encounter (Signed)
Charlotte from Dr. Blake Divine office called and wanted to let us know that Brittany Middleton is going to be in a clinical trial ([id number]).  Charlotte doesn't think Brittany Middleton will be resuming chemotherapy at [facility].

## 2021-04-17 NOTE — Telephone Encounter (Signed)
Pls call her before we cancel her appt

## 2021-04-20 ENCOUNTER — Encounter: Payer: Self-pay | Admitting: Hematology and Oncology

## 2021-04-20 ENCOUNTER — Telehealth: Payer: Self-pay | Admitting: Oncology

## 2021-04-20 NOTE — Telephone Encounter (Signed)
Brittany Middleton and she said she has to transfer her chemo treatments to [facility] to participate in the research study.  She would like to keep her appointment with Dr. [name] tomorrow if possible.

## 2021-04-20 NOTE — Telephone Encounter (Signed)
Left a message regarding the research study at [facility]. Requested a return call.

## 2021-04-21 ENCOUNTER — Encounter: Payer: Self-pay | Admitting: Hematology and Oncology

## 2021-04-21 ENCOUNTER — Inpatient Hospital Stay: Payer: Medicare Other | Attending: Hematology and Oncology | Admitting: Hematology and Oncology

## 2021-04-21 ENCOUNTER — Other Ambulatory Visit: Payer: Self-pay

## 2021-04-21 VITALS — BP 127/57 | HR 66 | Temp 98.3°F | Resp 18 | Ht 65.0 in | Wt 239.0 lb

## 2021-04-21 DIAGNOSIS — G8918 Other acute postprocedural pain: Secondary | ICD-10-CM | POA: Insufficient documentation

## 2021-04-21 DIAGNOSIS — R109 Unspecified abdominal pain: Secondary | ICD-10-CM

## 2021-04-21 DIAGNOSIS — K648 Other hemorrhoids: Secondary | ICD-10-CM | POA: Insufficient documentation

## 2021-04-21 DIAGNOSIS — R5381 Other malaise: Secondary | ICD-10-CM

## 2021-04-21 DIAGNOSIS — K5730 Diverticulosis of large intestine without perforation or abscess without bleeding: Secondary | ICD-10-CM | POA: Insufficient documentation

## 2021-04-21 DIAGNOSIS — I10 Essential (primary) hypertension: Secondary | ICD-10-CM | POA: Diagnosis not present

## 2021-04-21 DIAGNOSIS — R630 Anorexia: Secondary | ICD-10-CM | POA: Diagnosis not present

## 2021-04-21 DIAGNOSIS — C5701 Malignant neoplasm of right fallopian tube: Secondary | ICD-10-CM | POA: Diagnosis not present

## 2021-04-21 DIAGNOSIS — K921 Melena: Secondary | ICD-10-CM | POA: Diagnosis not present

## 2021-04-21 DIAGNOSIS — C786 Secondary malignant neoplasm of retroperitoneum and peritoneum: Secondary | ICD-10-CM | POA: Diagnosis not present

## 2021-04-21 DIAGNOSIS — J90 Pleural effusion, not elsewhere classified: Secondary | ICD-10-CM | POA: Insufficient documentation

## 2021-04-21 DIAGNOSIS — R634 Abnormal weight loss: Secondary | ICD-10-CM | POA: Insufficient documentation

## 2021-04-21 DIAGNOSIS — Z79899 Other long term (current) drug therapy: Secondary | ICD-10-CM | POA: Insufficient documentation

## 2021-04-21 MED ORDER — HYDROMORPHONE HCL 2 MG PO TABS: 2.0000 mg | ORAL_TABLET | Freq: Four times a day (QID) | ORAL | 0 refills | Status: DC | PRN

## 2021-04-21 NOTE — Assessment & Plan Note (Signed)
She has lost some weight Her blood pressure is now normal I recommend the patient to hold off taking Cozaar for now

## 2021-04-21 NOTE — Assessment & Plan Note (Signed)
She has signs of physical deconditioning since surgery Her physical activity is also limited by pain and recent poor oral intake She is scheduled to resume physical therapy end of the week We discussed importance of taking her pain medicine before activity I gave her an application form for disability parking permit

## 2021-04-21 NOTE — Progress Notes (Signed)
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 I encouraged the patient to participate in this clinical trial The first dose of treatment is scheduled for November 11 with 3 clinical trial imaging study and others I will continue to provide supportive care here in [place] I plan to see her again in 3 weeks for further follow-up  Postoperative pain She is struggling somewhat from postoperative pain We discussed the importance of getting her pain under control I refilled her prescription Dilaudid I recommend she takes Dilaudid in the morning before physical activity and to take it at nighttime to get better sleep We discussed narcotic refill policy  Essential (primary) hypertension She has lost some weight Her blood pressure is now normal I recommend the patient to hold off taking Cozaar for now  Weight loss, abnormal She has abnormal weight loss since surgery due to poor appetite We discussed importance of frequent small meals and to focus on protein if possible Gave her tracking sheet so that she can track her oral intake over the next few weeks  Physical deconditioning She has signs of physical  deconditioning since surgery Her physical activity is also limited by pain and recent poor oral intake She is scheduled to resume physical therapy end of the week We discussed importance of taking her pain medicine before activity I gave her an application form for disability parking permit  No orders of the defined types were placed in this encounter.   All questions were answered. The patient knows to call the clinic with any problems, questions or concerns. The total time spent in the appointment was 55 minutes encounter with patients including review of chart and various tests results, discussions about plan of care and coordination of care plan   [name], MD 04/21/2021 10:55 AM  INTERVAL HISTORY: Please see below for problem oriented charting. she returns for treatment follow-up with her son Rodman Key and her brother The patient is currently 2 weeks out from her surgery Her brother has been looking after the patient Tomorrow, her son, Suezanne Jacquet from [place] will be helping out She is experiencing significant postoperative pain, although gradually improving, needs to take regular acetaminophen and Dilaudid as needed She is wearing an abdominal binder that seems to help She has poor appetite and has lost some weight She wakes up intermittently at night to go to the bathroom to urinate She is extremely motivated to get better but I can see some limitation of achieving her goal due to inadequate nutrition and inadequate pain control Her bowel habits are regular with the use of regular laxatives 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; she is interested to participate in clinical trial at [facility] and would like to transfer her chemotherapy plan there I have reviewed extensive outside records with the patient and  family and spent a lot of time today reviewing plan of care and providing supportive care  REVIEW OF SYSTEMS:   Constitutional: Denies fevers, chills  Eyes: Denies blurriness of  vision Ears, nose, mouth, throat, and face: Denies mucositis or sore throat Respiratory: Denies cough, dyspnea or wheezes Cardiovascular: Denies palpitation, chest discomfort or lower extremity swelling Gastrointestinal:  Denies nausea, heartburn or change in bowel habits Skin: Denies abnormal skin rashes Lymphatics: Denies new lymphadenopathy or easy bruising Behavioral/Psych: Mood is stable, no new changes  All other systems were reviewed with the patient and are negative.  I have reviewed the past medical history, past surgical history, social history and family history with the patient and they are unchanged from previous note.  ALLERGIES:  is allergic to macrobid [nitrofurantoin], ciprofloxacin, codeine, and azithromycin.  MEDICATIONS:  Current Outpatient Medications  Medication Sig Dispense Refill   HYDROmorphone (DILAUDID) 2 MG tablet Take 1 tablet (2 mg total) by mouth every 6 (six) hours as needed for severe pain. 60 tablet 0   acetaminophen (TYLENOL) 500 MG tablet Take 1,000 mg by mouth every 6 (six) hours as needed for mild pain, moderate pain, fever or headache.     albuterol (PROVENTIL) (2.5 MG/3ML) 0.083% nebulizer solution Take 2.5 mg by nebulization every 6 (six) hours as needed for wheezing or shortness of breath.     alclomethasone (ACLOVATE) 0.05 % ointment Apply 1 application topically 2 (two) times daily.     BIOTIN PO Take 1 tablet by mouth daily.     Cholecalciferol (VITAMIN D-3) 25 MCG (1000 UT) CAPS Take 1,000 Units by mouth daily.     co-enzyme Q-10 30 MG capsule Take 30 mg by mouth daily.     dexamethasone (DECADRON) 4 MG tablet Take 2 tabs at the night before and 2 tab the morning of chemotherapy, every 3 weeks, by mouth x 6 cycles 36 tablet 6   estradiol (ESTRACE) 0.1 MG/GM vaginal cream Place 1 Applicatorful vaginally 3 (three) times a week.     lidocaine-prilocaine (EMLA) cream Apply to affected area once 30 g 3   Melatonin 5 MG CHEW Chew 5 mg by mouth at  bedtime.      Multiple Vitamins-Minerals (MULTIVITAMIN WITH MINERALS) tablet Take 1 tablet by mouth daily.     ondansetron (ZOFRAN) 8 MG tablet Take 1 tablet (8 mg total) by mouth every 8 (eight) hours as needed for refractory nausea / vomiting. 30 tablet 1   pantoprazole (PROTONIX) 40 MG tablet Take 1 tablet (40 mg total) by mouth daily. 30 tablet 1   polyethylene glycol (MIRALAX / GLYCOLAX) 17 g packet Take 8.5 g by mouth 2 (two) times daily.     rosuvastatin (CRESTOR) 20 MG tablet TAKE ONE TABLET BY MOUTH ONE TIME DAILY 90 tablet 0   No current facility-administered medications for this visit.   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 - Diverticulosis in the sigmoid colon and in the descending colon. - No specimens collected. - Blood in stool without cause found on endoscopic exam   05/08/2020 Imaging   1. Bladder is decompressed though demonstrates significant perivesicular hazy stranding, mucosal hyperemia and edematous mural thickening. Findings are suggestive of cystitis. Correlate with urinalysis. No abnormal perinephric or periureteral stranding or other features to suggest an ascending tract  infection at this time. 2. Colonic diverticulosis without evidence of acute diverticulitis. 3. Aortic Atherosclerosis (ICD10-I70.0).   12/28/2020 Imaging   1. 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 Differential for peritoneal carcinomatosis is primarily neoplastic and includes cancers of the ovary, appendix, colon, pancreas and stomach. PET-CT may be helpful for further characterization. Tissue sampling of the mesentery may be eventually required for diagnosis. 2. Mildly distended small bowel loops throughout the abdomen and pelvis, with fluid and associated air-fluid levels throughout the nondistended small bowel, suggesting a mild ileus versus partial small bowel obstruction. Favor partial small bowel obstruction with transition zone in the RIGHT lower quadrant, likely related to aforementioned neoplastic peritoneal implants and/or adhesions. 3. Small free fluid within the RIGHT upper quadrant and LEFT upper quadrant. No abscess collection seen. No free intraperitoneal air seen. 4. Extensive colonic diverticulosis without evidence of acute diverticulitis. 5. Small chronic pleural effusions with associated atelectasis.   12/29/2020 Tumor Marker   Patient's tumor was tested for the following markers: CA-125. Results of the tumor marker test revealed 95.1.   12/30/2020 Surgery   Preoperative diagnosis: abdominal nodules   Postoperative diagnosis: same   Procedure: diagnostic laparoscopy with abdominal wall biopsy   Surgeon: Gurney Maxin, M.D.    Indications for procedure: Martena Emanuele is a 70 y.o. year old female with symptoms of nausea, vomiting, and abdominal pain. Work up was concerning for cancer with abdominal wall studding.   Description of procedure: The patient was brought into the operative suite. Anesthesia was  administered with General endotracheal anesthesia. WHO checklist was applied. The patient was then placed in supine position. The area was prepped and draped in the usual sterile fashion.   A small left subcostal incision was made. A 56m trocar was used to gain access to the peritoneal cavity by optical entry technique. Pneumoperitoneum was applied with a high flow and low pressure. The laparoscope was reinserted to confirm position.   On initial visualization of the abdomen, there were multiple nodules throughout the abdominal wall. There were multiple nodules over the small intestine and mesentery. There was some loops of small intestine that were matted together. There was a large white mass in the left upper quadrant. Multiple areas of peritoneum were removed with harmonic scalpel. The large mass was inspected. It appeared to be related to the colon and omentum. Grasper was used to remove some superficial tissue and was sent for culture.   The abdomen was desufflated. The incisions were closed with 4-0 monocryl subcuticular stitch.   12/30/2020 Pathology Results   FINAL MICROSCOPIC DIAGNOSIS:   A. ABDOMINAL WALL, NODULE, EXCISION:  -  Metastatic adenocarcinoma  -  See comment   B. PERI COLONIC EXUDATE, EXCISION:  -  Metastatic adenocarcinoma  -  See comment   COMMENT:  By immunohistochemistry, the neoplastic cells are positive for cytokeratin 7, PAX8 and WT1 but negative for cytokeratin 20, CDX2, p53 and TTF-1.  The morphology and immunophenotype are consistent with a gynecologic primary.     01/01/2021 Initial Diagnosis   Peritoneal carcinomatosis ([place])   01/08/2021 Cancer Staging   Staging form: Ovary, Fallopian Tube, and Primary Peritoneal Carcinoma, AJCC 8th Edition - Clinical stage from 01/08/2021: cT3, cN0, cM0 - Signed by [name], MD on 01/08/2021 Stage prefix: Initial diagnosis    01/12/2021 - 03/11/2021 Chemotherapy   Patient is on Treatment Plan : OVARIAN Carboplatin (AUC  6) / Paclitaxel (175) q21d x 6 cycles     01/17/2021 Imaging   MRI brain No evidence of intracranial metastatic disease or other acute abnormality   02/12/2021 Genetic Testing   No pathogenic variants detected in [facility].  The report date is February 12, 2021.   The [facility] gene panel offered by [employer] includes sequencing and deletion/duplication testing of the following 48 genes: APC, ATM, AXIN2, BAP1, BARD1, BMPR1A, BRCA1, BRCA2, BRIP1, CHD1, CDK4, CDKN2A(p16 and p14ARF), CHEK2, CTNNA1, EGFR, EPCAM, FH, FLCN, GREM1, HOXB13, MEN1, MET, MITF , MLH1, MSH2, MSH3, MSH6, MUTYH, NTHL1, PALB2, PMS2, POLD1, POLE, PTEN, RAD51C, RAD51D, RET, SDHA, SDHB, SDHC, SDHD, SMAD4, STK11,TERT, TP53, TSC1, TSC2, and VHL.  Unless otherwise specified, all coding regions and flanking non-coding regions are analyzed for sequence variation. Analysis of flanking intronic regions typically do not extend more than 20 bp before and 10 bp after each exon, though the exact region may be adjusted based on the presence of either potentially significant variants or highly repetitive sequences. Coding regions and proximal promoter regions near the transcription start sites are analyzed for large deletions or duplications. Specific genes are tested only for sequence and/or CNVs within limited regions. Limited clinically relevant regions are included for EGFR (sequencing and CNV analysis of exons 18-21),RET (sequencing and CNV analysis of exons 5, 8, 10, 11, and 13-16), and MITF (sequencing of position c.952). Only CNV analysis of the last two exons of EPCAM is performed. CNV analysis of GREM1 includes the upstream region overlapping the adjacent gene SCG5. MSH3 exon 1 contains a long polyalanine repeat that can interfere with variant calling; therefore, MSH3 analysis excludes c.121 to c.237. Only sequence analysis of the exons encompassing the exonuclease domains of these genes is performed (POLD1 c.841  to c.1686, POLE c.802 to c.1473). Limited promoter regions in selected genes undergo sequence analysis including TERT (c.-71 to c.-1), and APC Promoter 1B (c.-195 to c.-190 and c.-125 ([id number]).   HRD testing pending.    03/02/2021 Imaging   Outside imaging at [facility] improved peritoneal carcinomatosis. No evidence of new metastatic disease or acute abdominal pathology.    03/02/2021 Imaging   Outside CT imaging done at [facility] improved peritoneal carcinomatosis. No evidence of new metastatic disease or acute abdominal pathology   03/02/2021 - 03/03/2021 Hospital Admission   She was hospitalized briefly for evaluation of hematochezia at [facility].  She underwent CT imaging and colonoscopy and was subsequently discharged   03/03/2021 Procedure   Colonoscopy was performed at [facility]  Findings: Diverticula were found in the entire colon. Internal hemorrhoids were found during retroflexion.  The hemorrhoids were Grade I (internal hemorrhoids that do not prolapse). Impression: - Diverticulosis in the entire examined colon. Likely source of bleeding. - Internal hemorrhoids.   04/07/2021 Surgery   PROCEDURES:  Exam under anesthesia Diagnostic laparoscopy Exploratory laparotomy Lysis of adhesions (>30  minutes) Infragastric omentectomy Total abdominal hysterectomy and bilateral salpingo-oophorectomy Optimal (R0) interval tumor debulking  SURGEON: Surgeon(s) and Role: Mellody Drown, MD - Primary * Salinaro, Whitney Muse, MD - Resident - Assisting * Riki Sheer, Owens Shark, MD - Fellow  INDICATION(S): 70 y.o. who presented with carcinomatosis and mildly elevated CA125 to 95 with biopsy showing mullerian adenocarcinoma, s/p 3 cycles of neoadjuvant chemotherapy with normalization of CA125 and decreased disease burden on imaging.  OPERATIVE FINDINGS:  On exam, no palpable masses. On laparoscopy, omentum thickened and retracted along the transverse colon, pelvic adhesive disease.  On laparotomy, omentum thickened, retracted, and densely adherent to the underlying colon mesentery, but without definitive viable tumor. Adhesions of the omentum to the descending colon, rectosigmoid colon to the left adnexa and posterior uterus and cervix, loop of ileum to the right adnexa, and bladder to anterior uterus and cervix. Transverse colon with small diverticulum that was over-sewed. Appendix normal and retrocecal. No grossly visible tumor in the abdomen including smooth diaphragm and liver surface, normal stomach, small bowel, colon, and normal appearing uterus, bilateral fallopian tubes, and bilateral ovaries.  At the conclusion of the case, no gross residual disease remained (R0).    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     PHYSICAL EXAMINATION: ECOG PERFORMANCE STATUS: 2 - Symptomatic, <50% confined to bed  Vitals:   04/21/21 0913  BP: (!) 127/57  Pulse: 66  Resp: 18  Temp: 98.3 F (36.8 C)  SpO2: 98%   Filed Weights   04/21/21 0913  Weight: 239 lb (108.4 kg)    GENERAL:alert, no distress and comfortable NEURO: alert & oriented x 3 with fluent speech, no focal motor/sensory deficits  LABORATORY DATA:  I have reviewed the data as listed    Component Value Date/Time   NA 144 03/06/2021 1307   NA 143 12/20/2019 0829   K 4.1 03/06/2021 1307   CL 106 03/06/2021 1307   CO2 28 03/06/2021 1307   GLUCOSE 99 03/06/2021 1307   BUN 12 03/06/2021 1307   BUN 16 12/20/2019 0829   CREATININE 0.66 03/06/2021 1307   CALCIUM 9.3 03/06/2021 1307   PROT 7.0 03/06/2021 1307   PROT 6.4 02/04/2020 1036   ALBUMIN 3.9 03/06/2021 1307   ALBUMIN 4.2 02/04/2020 1036   AST 22 03/06/2021 1307   ALT 21 03/06/2021 1307   ALKPHOS 65  03/06/2021 1307   BILITOT 0.5 03/06/2021 1307   GFRNONAA >60 03/06/2021 1307   GFRAA 104 12/20/2019 0829    No results found for: SPEP, UPEP  Lab Results  Component Value Date   WBC 3.7 (L) 03/06/2021   NEUTROABS 2.0 03/06/2021   HGB 10.5 (L) 03/06/2021   HCT 32.5 (L) 03/06/2021   MCV 92.1 03/06/2021   PLT 141 (L) 03/06/2021      Chemistry      Component Value Date/Time   NA 144 03/06/2021 1307   NA 143 12/20/2019 0829   K 4.1 03/06/2021 1307   CL 106 03/06/2021 1307   CO2 28 03/06/2021 1307   BUN 12 03/06/2021 1307   BUN 16 12/20/2019 0829   CREATININE 0.66 03/06/2021 1307      Component Value Date/Time   CALCIUM 9.3 03/06/2021 1307   ALKPHOS 65 03/06/2021 1307   AST 22 03/06/2021 1307  ALT 21 03/06/2021 1307   BILITOT 0.5 03/06/2021 1307

## 2021-04-21 NOTE — Assessment & Plan Note (Signed)
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

## 2021-04-21 NOTE — Assessment & Plan Note (Signed)
She is struggling somewhat from postoperative pain We discussed the importance of getting her pain under control I refilled her prescription Dilaudid I recommend she takes Dilaudid in the morning before physical activity and to take it at nighttime to get better sleep We discussed narcotic refill policy

## 2021-04-21 NOTE — Assessment & Plan Note (Signed)
She has abnormal weight loss since surgery due to poor appetite We discussed importance of frequent small meals and to focus on protein if possible Gave her tracking sheet so that she can track her oral intake over the next few weeks

## 2021-04-23 ENCOUNTER — Other Ambulatory Visit: Payer: Self-pay | Admitting: Hematology and Oncology

## 2021-04-23 ENCOUNTER — Telehealth: Payer: Self-pay

## 2021-04-23 ENCOUNTER — Other Ambulatory Visit (HOSPITAL_COMMUNITY): Payer: Self-pay

## 2021-04-23 ENCOUNTER — Encounter: Payer: Self-pay | Admitting: Hematology and Oncology

## 2021-04-23 MED ORDER — HYDROMORPHONE HCL 2 MG PO TABS: 2.0000 mg | ORAL_TABLET | Freq: Four times a day (QID) | ORAL | 0 refills | Status: DC | PRN

## 2021-04-23 MED ORDER — HYDROMORPHONE HCL 2 MG PO TABS
2.0000 mg | ORAL_TABLET | Freq: Four times a day (QID) | ORAL | 0 refills | Status: DC | PRN
  Filled 2021-04-23 (×3): qty 60, 15d supply, fill #0

## 2021-04-23 NOTE — Telephone Encounter (Signed)
OK, I sent it to [facility] Please make sure they have it, and then call [facility] to cancel the prescription there

## 2021-04-23 NOTE — Telephone Encounter (Signed)
She called and left a message. Dilaudid Rx is on back order at [facility] for the last 2 days.  She is asking if you can send [facility] [place]? Added pharmacy to her list.

## 2021-04-23 NOTE — Telephone Encounter (Signed)
Returned call and spoke with her son. [facility] not [facility]. He apologized for the mix up.  Ask that you send Rx to [facility] outpatient pharmacy.

## 2021-04-23 NOTE — Telephone Encounter (Signed)
It's Thursday, pls let me know if they still don't have it, we can send it to [facility] I [facility]

## 2021-04-23 NOTE — Telephone Encounter (Signed)
Called and canceled [facility]audid [facility] at [facility]. [facility] outpatient pharmacy they have the Rx in stock.

## 2021-04-24 ENCOUNTER — Other Ambulatory Visit: Payer: Self-pay

## 2021-04-24 ENCOUNTER — Ambulatory Visit: Payer: Medicare Other | Attending: Hematology and Oncology

## 2021-04-24 DIAGNOSIS — C786 Secondary malignant neoplasm of retroperitoneum and peritoneum: Secondary | ICD-10-CM | POA: Insufficient documentation

## 2021-04-24 DIAGNOSIS — M6281 Muscle weakness (generalized): Secondary | ICD-10-CM | POA: Diagnosis present

## 2021-04-24 DIAGNOSIS — C762 Malignant neoplasm of abdomen: Secondary | ICD-10-CM | POA: Insufficient documentation

## 2021-04-24 DIAGNOSIS — R293 Abnormal posture: Secondary | ICD-10-CM | POA: Insufficient documentation

## 2021-04-24 NOTE — Patient Instructions (Addendum)
Pt was instructed in sitting pelvic rock in small ROM, pelvic tilt supine, bridge small ROM, ab set/ppt with heel slides, sitting and supine hip abd.  Exercises to be done 3-5 reps gently with breaks between and are not to increase pain.  Pt had [employer] pictures printed but forgot to save to put copy in chart

## 2021-04-24 NOTE — Therapy (Addendum)
[facility] @ [facility] [place], [place], [address] Phone: [phone]   Fax:  [phone]  Physical Therapy Treatment  Patient Details  Name: Brittany Middleton MRN: [id number] Date of Birth: 02-16-1951 Referring Provider (PT): Dr. 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 Last Assessment & Plan:  Formatting of this note might be different from the original. - continue home meds   Family history of breast cancer 01/27/2021   Family history of lung cancer 01/27/2021   Gastrointestinal bleeding 07/25/2018   Gastrointestinal hemorrhage with melena 07/16/2018   Genetic testing 02/13/2021   No pathogenic variants detected in [facility] [facility].  The report date is February 12, 2021.   The [facility] gene panel offered by [facility] includes sequencing and deletion/duplication testing of the following 48 genes: APC,  ATM, AXIN2, BAP1, BARD1, BMPR1A, BRCA1, BRCA2, BRIP1, CHD1, CDK4, CDKN2A(p16 and p14ARF), CHEK2, CTNNA1, EGFR, EPCAM, FH, FLCN, GREM1, HOXB13, MEN1, M   GERD (gastroesophageal reflux disease)    Hematuria 06/19/2020   Hepatic steatosis    per imaging 2016, 2019   Hiatal hernia    History of palpitations    Holter monitor, 2017: NSR, occasional PVC, PACs, no arrhythmia   HLD (hyperlipidemia) 02/27/2018   HTN (hypertension) 02/27/2018   Hypercholesteremia    Hypercholesterolemia 04/07/2017   Hyperglycemia 06/19/2020   Hypertension    Hypertensive disorder 04/07/2017   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   Hypokalemia 07/17/2018   Lumbar disc disease    MRI, 2017    Obesity, Class III, BMI 40-49.9 (morbid obesity) ([place]) 05/22/2018   Overweight 03/03/2018   Palpitations 08/19/2015   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[place])    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   Physical debility 01/10/2021   Pneumonia    Pure hypercholesterolemia, unspecified 04/07/2017   Pyelonephritis 03/30/2020   Recurrent UTI 05/28/2020   SBO (small bowel obstruction) ([place]) 12/28/2020   Sepsis secondary to UTI ([place]) 04/25/2020   Severe sepsis ([place]) 05/21/2020   Severe sepsis with acute organ dysfunction ([place]) 04/25/2020   Skin infection 02/05/2021   Skin irritation 02/05/2021   Sleep apnea    Urinary tract infection due to extended-spectrum beta lactamase (ESBL) producing Escherichia coli    Uterine cancer ([place]) 01/01/2021  Past Surgical History:  Procedure Laterality Date   BLADDER REPAIR     BREAST EXCISIONAL BIOPSY Left    CARPAL TUNNEL RELEASE Right    CATARACT EXTRACTION, BILATERAL  2014   COLONOSCOPY WITH PROPOFOL N/A 07/18/2018   Procedure: COLONOSCOPY WITH PROPOFOL;  Surgeon: Laurence Spates, MD;  Location: [place];  Service: Endoscopy;  Laterality: N/A;   ESOPHAGOGASTRODUODENOSCOPY (EGD) WITH PROPOFOL N/A 07/17/2018   Procedure: ESOPHAGOGASTRODUODENOSCOPY (EGD) WITH PROPOFOL;  Surgeon: Laurence Spates,  MD;  Location: [place];  Service: Endoscopy;  Laterality: N/A;   LAPAROSCOPY N/A 12/30/2020   Procedure: LAPAROSCOPY DIAGNOSTIC; WITH  BIOPSY OF ABDOMINAL WALL NODULES AND BIOPSY OF PERICOLONIC EXUDATE;  Surgeon: Kinsinger, Arta Bruce, MD;  Location: [facility];  Service: General;  Laterality: N/A;   PORTACATH PLACEMENT N/A 01/09/2021   Procedure: PORT INSERTION WITH [place] & FLUORO;  Surgeon: Kieth Brightly Arta Bruce, MD;  Location: [facility];  Service: General;  Laterality: N/A;   REPAIR RECTOCELE     TOE SURGERY Left     There were no vitals filed for this visit.   Subjective Assessment - 04/24/21 1004     Subjective Pt is s/p total hysterectomy  04/07/2021 secondary to metastatic adenocarcinoma.She reports she was doing well and thinks she aggravated her left abdominal region by pushing down the recliner with her legs. She reports she is moving so slowly because I have a spot that is so painful in my abdomen and they have me on dilaudid.  I do have an abdominal binder that I am wearing today. The stitches are doing really well. Walked around [place] yesterday and I got really fatigued and painful. I know I can't do much yet, but I wanted to see if there are some gentle exercises I can do.    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 She is presently undergoing chemotherapy with Carboplatin and Paclitaxel every 3 weeeks and has 3 more cycles.  Planning complete hysterectomy for 03/24/21-03/26/2021 and likely to resume chemo on 04/13/2021 if all goes well    Patient Stated Goals recover from surgery.    Currently in Pain? Yes    Pain Score 4    after having taken dilaudid   Pain Location Abdomen    Pain Orientation Left    Pain Descriptors / Indicators Burning    Pain Type Surgical pain    Pain Onset 1 to 4 weeks ago    Pain Frequency Intermittent    Aggravating Factors  getting up and down from bed, from chairs, turning in bed. Moving in general     Pain Relieving Factors sitting, relaxing    Effect of Pain on Daily Activities limits most activities                [facility] PT Assessment - 04/24/21 0001       Assessment   Medical Diagnosis peritoneal Carcinomatosis    Referring Provider (PT) Dr. [name]    Onset Date/Surgical Date 12/30/20    Prior Therapy yes      Precautions   Precaution Comments post surgery      Restrictions   Weight Bearing Restrictions No      Balance Screen   Has the patient fallen in the past 6 months No    Has the patient had a decrease in activity level because of a fear of falling?  No    Is the patient reluctant to leave their home because of a fear of falling?  No      Home Ecologist residence    Living Arrangements Parent      Prior Function   Level of Independence Independent    Vocation Retired    Leisure nothing presently      Cognition   Overall Cognitive Status Within Functional Limits for tasks assessed      Observation/Other Assessments   Observations Portacath on right. not as uncomfortable now.      Posture/Postural Control   Posture/Postural Control Postural limitations    Postural Limitations Rounded Shoulders;Forward head      AROM   Right Shoulder Flexion 145 Degrees    Right Shoulder ABduction 135 Degrees    Left Shoulder Flexion 140 Degrees   feel in abdomen on left   Left Shoulder ABduction 125 Degrees   feel in abdomen on left     Strength   Strength Assessment Site --   deferred secpndary to abdominal pain     Palpation   Palpation comment Tender left abdominal region several inches lateral to Navel.      Ambulation/Gait   Ambulation/Gait --   ambulating slowly, holding abdominal region                                PT Education - 04/24/21 1310     Education Details sitting/supine ppt, heel slides, sitting and supine hip abd;3-5reps only if can be done without increasing pain.  Discussed pt  trying Shapewear, or going to Medical store to try other abdominal binders secondary to not being happy with one she got online.  Also discussed calling hospital to find out what brand the one she was issued is that got shrunk in the dryer.    Person(s) Educated Patient                 PT Long Term Goals - 02/20/21 2016       PT LONG TERM GOAL #1   Title Pt will be independent with HEP for generalized stregth, flexibility and balance training    Time 4    Period Weeks    Status New    Target Date 06/19/21      PT LONG TERM GOAL #2   Title Pt will perform 12 sit to stands in 30 seconds for decreased risk of falls    Baseline 9    Time 4    Period Weeks    Status New    Target Date 06/19/21      PT LONG TERM GOAL #3   Title Pt will be able to maintain bilateral SLS for greater than 10 seconds each without excessive use of arms to maintain balance    Time 4    Period Weeks    Status New    Target Date 06/19/21      PT LONG TERM GOAL #4   Title Pt will report  improved heel toe gait pattern after walking short periods    Time 4    Period Weeks    Status New    Target Date 06/19/21                   Plan - 04/24/21 1313     Clinical Impression Statement Pt is slightly over 2 weeks post surgery for total hysterectomy for metastatic adeno-carcinoma.  She had 2 prior visits to try and get stronger before having surgery.  She presents today with pain in the left abdomen lateral to her navel.  She thinks pain was brought on by using her legs to put the recliner down. She is wearing an abdominal binder and had questions about other things she could do.  We talked about Shapewear for compression and to go under the binder to protect her skin.  She was shown several gentle exercises to try at home, in addition to multiple short periods of walking.  Explained that she is not to do exercises that cause an increase in pain and that she may have to wait a week or more before  beginning them if they are too painful.  She was a 4/10 this morning  with Dilauded on board before exercising..  We cancelled appts for next week.  Pt was advised to try exs but if too difficult to wait another week,  She did only a few reps of each exercise and was very fatigued today.  She did have an increase in discomfort after performing exercises, that would go away when she rested.  She then experienced a different type of discomfort that she felt was "her intestines working' and she got up to use the rest room. She was given illustrated instructions and advised to contact me with any questions. She did have some scans done that did not reveal anything unusual. We also discussed heat vs ice, or alternating for pain control, and desensitizing techniques for skin sensitivity. Evaluation was limited today by pain and strength testing will be performed at a later date.    Personal Factors and Comorbidities Comorbidity 3+    Comorbidities s/p total hysterectomy 04/07/2021, aortic aneurysm, prior severe sepsis with acute organ failure, HBP    Examination-Activity Limitations Locomotion Level;Reach Overhead;Bend;Squat;Dressing;Stairs    Stability/Clinical Decision Making Evolving/Moderate complexity    Clinical Decision Making Low    Rehab Potential Good    PT Frequency 2x / week    PT Duration 8 weeks    PT Treatment/Interventions ADLs/Self Care Home Management;Stair training;Gait training;Therapeutic activities;Therapeutic exercise;Balance training;Neuromuscular re-education;Manual techniques;Patient/family education    PT Next Visit Plan skip next week, return the following if pain improved, start with gentle strength,endurance    Consulted and Agree with Plan of Care Patient             Patient will benefit from skilled therapeutic intervention in order to improve the following deficits and impairments:  Decreased activity tolerance, Decreased balance, Decreased knowledge of precautions,  Decreased endurance, Decreased range of motion, Decreased strength, Postural dysfunction, Pain, Decreased mobility  Visit Diagnosis: Muscle weakness (generalized)  Abnormal posture  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[place])  Malignant neoplasm of abdomen [facility])     Problem List Patient Active Problem List   Diagnosis Date Noted   Postoperative pain 04/21/2021   Weight loss, abnormal 04/21/2021   Pancytopenia, acquired ([place]) 03/06/2021   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   Coronary artery calcification 03/05/2021   Arthritis 03/04/2021   GERD (gastroesophageal reflux disease) 03/04/2021   Pneumonia 03/04/2021   Genetic testing 02/13/2021   Family history of breast cancer 01/27/2021   Family history of lung cancer 01/27/2021   Dysuria 01/26/2021   Physical deconditioning 01/10/2021   Diplopia 01/09/2021   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   Dyspnea 06/19/2020   Epigastric pain 06/19/2020   Hematuria 06/19/2020   Hyperglycemia 06/19/2020   Abdominal pain  05/28/2020   Recurrent UTI 05/28/2020   Asthma    Sleep apnea    Cervical disc disease    Hepatic steatosis    History of palpitations    Hypercholesteremia    Hypertension    Lumbar disc disease    Pyelonephritis 03/30/2020   Gastrointestinal bleeding 07/25/2018   Hiatal hernia    Anemia 07/17/2018   Acute blood loss anemia 07/17/2018   Hypokalemia 07/17/2018   Gastrointestinal hemorrhage with melena 07/16/2018   Obesity, Class III, BMI 40-49.9 (morbid obesity) ([place]) 05/22/2018   Overweight 03/03/2018   Coronary artery aneurysm 03/03/2018   Chest pain with moderate risk for cardiac etiology 02/27/2018   HTN (hypertension) 02/27/2018   HLD (hyperlipidemia) 02/27/2018   Hypercholesterolemia 04/07/2017   Hypertensive disorder 04/07/2017   Pure hypercholesterolemia, unspecified  04/07/2017   Essential (primary) hypertension 04/07/2017   Palpitations 08/19/2015    Claris Pong, PT 04/24/2021, 1:30 PM  [facility] @ [place], [place], [address] Phone: [phone]   Fax:  [phone]  Name: Brittany Middleton MRN: [id number] Date of Birth: 1951/04/11

## 2021-04-30 ENCOUNTER — Encounter: Payer: Self-pay | Admitting: Hematology and Oncology

## 2021-04-30 ENCOUNTER — Telehealth: Payer: Self-pay | Admitting: Oncology

## 2021-04-30 ENCOUNTER — Telehealth: Payer: Self-pay | Admitting: *Deleted

## 2021-04-30 ENCOUNTER — Other Ambulatory Visit: Payer: Self-pay | Admitting: Hematology and Oncology

## 2021-04-30 NOTE — Telephone Encounter (Signed)
Per the chemotherapy authorization team, prior authorization was not required for chemotherapy so we did not have it.  Brittany Middleton and she is going to call [facility] back.    Also discussed that we can get her scheduled for chemo at [facility] on 05/15/21 if her chemo is not done tomorrow at [facility].  She said she is worried about waiting until 11/25 because she was told she had a very aggressive form of cancer.  She will let us know if her chemo is canceled tomorrow.

## 2021-04-30 NOTE — Telephone Encounter (Addendum)
Brittany Middleton received a call from [name] at 2:20 saying that she does not have authorization for chemo tomorrow since it was previously authorized at [employer].  She needs [facility] to call [employer] to switch the authorization at [phone] and then fax it to [facility] to the [name] at [phone].  [facility] has also canceled her next two infusions after tomorrow. Message sent to chemotherapy authorization team.

## 2021-04-30 NOTE — Telephone Encounter (Signed)
Ms. Brittany Middleton called to report that she did not qualify for [facility] Clinical Trial/Immunotherapy due to Albumin of 3.3 as 3.5 was required. She is scheduled for first chemotherapy at [facility] tomorrow 04/21/2021. She wants know if Dr. [name] thinks she should have chemotherapy at [facility] instead?   Dr. [name] informed of patient call and message.  Contacted patient with Dr. [name] response: Continue with chemotherapy as currently planned at [facility]. Will discuss plan going forward at scheduled appt 05/12/21. Patient verbalized understanding and states she is currently scheduled at [facility] for chemotherapy 11/22; 12/2; 12/23. She asked that Dr. [name] receive this information.  Call information with scheduled treatment dates routed to Dr. [name].

## 2021-04-30 NOTE — Telephone Encounter (Signed)
Called Brittany Middleton and she talked to [facility] and they think everything is ok if she doesn't need prior authorization. They were going to call [employer] to make sure.  She is planning on going to the appointment at 7:25 tomorrow morning.

## 2021-04-30 NOTE — Telephone Encounter (Signed)
OK I will keep her scheduled appt as is

## 2021-05-04 ENCOUNTER — Other Ambulatory Visit: Payer: Self-pay | Admitting: Hematology and Oncology

## 2021-05-04 NOTE — Progress Notes (Signed)
ON PATHWAY REGIMEN - Ovarian  No Change  Continue With Treatment as Ordered.  Original Decision Date/Time: 01/01/2021 15:22     A cycle is every 21 days:     Paclitaxel      Carboplatin   **Always confirm dose/schedule in your pharmacy ordering system**  Patient Characteristics: Preoperative or Nonsurgical Candidate (Clinical Staging), Newly Diagnosed, Neoadjuvant Therapy followed by Surgery BRCA Mutation Status: Awaiting Test Results Therapeutic Status: Preoperative or Nonsurgical Candidate (Clinical Staging) AJCC T Category: cT3 AJCC 8 Stage Grouping: Unknown AJCC N Category: cN0 AJCC M Category: cM0 Therapy Plan: Neoadjuvant Therapy followed by Surgery Intent of Therapy: Curative Intent, Discussed with Patient

## 2021-05-08 ENCOUNTER — Encounter: Payer: Self-pay | Admitting: Hematology and Oncology

## 2021-05-12 ENCOUNTER — Encounter: Payer: Self-pay | Admitting: Hematology and Oncology

## 2021-05-12 ENCOUNTER — Other Ambulatory Visit: Payer: Self-pay

## 2021-05-12 ENCOUNTER — Inpatient Hospital Stay (HOSPITAL_BASED_OUTPATIENT_CLINIC_OR_DEPARTMENT_OTHER): Payer: Medicare Other | Admitting: Hematology and Oncology

## 2021-05-12 DIAGNOSIS — C5701 Malignant neoplasm of right fallopian tube: Secondary | ICD-10-CM | POA: Diagnosis not present

## 2021-05-12 DIAGNOSIS — R5381 Other malaise: Secondary | ICD-10-CM | POA: Diagnosis not present

## 2021-05-12 NOTE — Assessment & Plan Note (Signed)
She is recovering well from surgery and tolerated recent chemotherapy from [facility] I reviewed the premedications and the doses of chemotherapy which is significantly different from our prescribed regimen She will return first week of December for cycle 5 of chemotherapy and she will complete cycle 6 of treatment by the end of the year My plan would be to repeat CT imaging within the month of completion of chemotherapy In the meantime, we will focus on supportive care

## 2021-05-12 NOTE — Progress Notes (Signed)
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[facility]) She is recovering well from surgery and tolerated recent chemotherapy from [place] well I reviewed the premedications and the doses of chemotherapy which is significantly different from our prescribed regimen She will return first week of December for cycle 5 of chemotherapy and she will complete cycle 6 of treatment by the end of the year My plan would be to repeat CT imaging within the month of completion of chemotherapy In the meantime, we will focus on supportive care  Physical deconditioning She has generalized deconditioning but overall stable/improving She will resume physical therapy as scheduled  No orders of the defined types were placed in this encounter.   All questions were answered. The patient knows to call the clinic with any problems, questions or concerns. The total time spent in the appointment was 20 minutes encounter with patients including review of chart and various tests results, discussions about plan of care and coordination of care plan   [name], MD 05/12/2021 2:36 PM  INTERVAL HISTORY: Please see below for problem oriented charting. she returns for treatment follow-up to be seen after recent treatment I have reviewed documentation from [facility] She tolerated recent treatment fairly well except for slightly more aches and pain Her energy level is slowly improving Her incisions are well-healed  REVIEW OF SYSTEMS:   Constitutional: Denies fevers, chills or abnormal weight loss Eyes: Denies blurriness of vision Ears, nose, mouth, throat, and face: Denies mucositis or sore throat Respiratory: Denies cough, dyspnea or  wheezes Cardiovascular: Denies palpitation, chest discomfort or lower extremity swelling Gastrointestinal:  Denies nausea, heartburn or change in bowel habits Skin: Denies abnormal skin rashes Lymphatics: Denies new lymphadenopathy or easy bruising Neurological:Denies numbness, tingling or new weaknesses Behavioral/Psych: Mood is stable, no new changes  All other systems were reviewed with the patient and are negative.  I have reviewed the past medical history, past surgical history, social history and family history with the patient and they are unchanged from previous note.  ALLERGIES:  is allergic to macrobid [nitrofurantoin], ciprofloxacin, codeine, and azithromycin.  MEDICATIONS:  Current Outpatient Medications  Medication Sig Dispense Refill   acetaminophen (TYLENOL) 500 MG tablet Take 1,000 mg by mouth every 6 (six) hours as needed for mild pain, moderate pain, fever or headache.     albuterol (PROVENTIL) (2.5 MG/3ML) 0.083% nebulizer solution Take 2.5 mg by nebulization every 6 (six) hours as needed for wheezing or shortness of breath.     alclomethasone (ACLOVATE) 0.05 % ointment Apply 1 application topically 2 (two) times daily.     BIOTIN PO Take 1 tablet by mouth daily.     Cholecalciferol (VITAMIN D-3) 25 MCG (1000 UT) CAPS Take 1,000 Units by mouth daily.     co-enzyme Q-10 30 MG capsule Take 30 mg by mouth daily.     dexamethasone (DECADRON) 4 MG tablet Take 2 tabs at the night before and 2 tab the morning of chemotherapy, every 3 weeks, by mouth x 6 cycles 36 tablet 6   estradiol (ESTRACE) 0.1 MG/GM vaginal cream Place 1 Applicatorful vaginally 3 (three) times a week.     HYDROmorphone (DILAUDID) 2 MG tablet Take 1 tablet (2 mg total) by mouth every 6 (six) hours as needed for severe  pain. 60 tablet 0   lidocaine-prilocaine (EMLA) cream Apply to affected area once 30 g 3   Melatonin 5 MG CHEW Chew 5 mg by mouth at bedtime.      Multiple Vitamins-Minerals (MULTIVITAMIN WITH  MINERALS) tablet Take 1 tablet by mouth daily.     ondansetron (ZOFRAN) 8 MG tablet Take 1 tablet (8 mg total) by mouth every 8 (eight) hours as needed for refractory nausea / vomiting. 30 tablet 1   pantoprazole (PROTONIX) 40 MG tablet Take 1 tablet (40 mg total) by mouth daily. 30 tablet 1   polyethylene glycol (MIRALAX / GLYCOLAX) 17 g packet Take 8.5 g by mouth 2 (two) times daily.     rosuvastatin (CRESTOR) 20 MG tablet TAKE ONE TABLET BY MOUTH ONE TIME DAILY 90 tablet 0   No current facility-administered medications for this visit.   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 - Diverticulosis in the sigmoid colon and in the descending colon. - No specimens collected. - Blood in stool without cause found on endoscopic exam   05/08/2020 Imaging   1. Bladder is decompressed though demonstrates significant perivesicular hazy stranding, mucosal hyperemia and edematous mural thickening. Findings are suggestive of cystitis. Correlate with urinalysis. No abnormal perinephric or periureteral stranding or other features to suggest an ascending tract infection at this time. 2. Colonic diverticulosis without evidence  of acute diverticulitis. 3. Aortic Atherosclerosis (ICD10-I70.0).   12/28/2020 Imaging   1. 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 Differential for peritoneal carcinomatosis is primarily neoplastic and includes cancers of the ovary, appendix, colon, pancreas and stomach. PET-CT may be helpful for further characterization. Tissue sampling of the mesentery may be eventually required for diagnosis. 2. Mildly distended small bowel loops throughout the abdomen and pelvis, with fluid and associated air-fluid levels throughout the nondistended small bowel, suggesting a mild ileus versus partial small bowel obstruction. Favor partial small bowel obstruction with transition zone in the RIGHT lower quadrant, likely related to aforementioned neoplastic peritoneal implants and/or adhesions. 3. Small free fluid within the RIGHT upper quadrant and LEFT upper quadrant. No abscess collection seen. No free intraperitoneal air seen. 4. Extensive colonic diverticulosis without evidence of acute diverticulitis. 5. Small chronic pleural effusions with associated atelectasis.   12/29/2020 Tumor Marker   Patient's tumor was tested for the following markers: CA-125. Results of the tumor marker test revealed 95.1.   12/30/2020 Surgery   Preoperative diagnosis: abdominal nodules   Postoperative diagnosis: same   Procedure: diagnostic laparoscopy with abdominal wall biopsy   Surgeon: Gurney Maxin, M.D.    Indications for procedure: [name]  is a 70 y.o. year old female with symptoms of nausea, vomiting, and abdominal pain. Work up was concerning for cancer with abdominal wall studding.   Description of procedure: The patient was brought into the operative suite. Anesthesia was administered with General endotracheal anesthesia. WHO checklist was  applied. The patient was then placed in supine position. The area was prepped and draped in the usual sterile fashion.   A small left subcostal incision was made. A 22m trocar was used to gain access to the peritoneal cavity by optical entry technique. Pneumoperitoneum was applied with a high flow and low pressure. The laparoscope was reinserted to confirm position.   On initial visualization of the abdomen, there were multiple nodules throughout the abdominal wall. There were multiple nodules over the small intestine and mesentery. There was some loops of small intestine that were matted together. There was a large white mass in the left upper quadrant. Multiple areas of peritoneum were removed with harmonic scalpel. The large mass was inspected. It appeared to be related to the colon and omentum. Grasper was used to remove some superficial tissue and was sent for culture.   The abdomen was desufflated. The incisions were closed with 4-0 monocryl subcuticular stitch.   12/30/2020 Pathology Results   FINAL MICROSCOPIC DIAGNOSIS:   A. ABDOMINAL WALL, NODULE, EXCISION:  -  Metastatic adenocarcinoma  -  See comment   B. PERI COLONIC EXUDATE, EXCISION:  -  Metastatic adenocarcinoma  -  See comment   COMMENT:   By immunohistochemistry, the neoplastic cells are positive for cytokeratin 7, PAX8 and WT1 but negative for cytokeratin 20, CDX2, p53 and TTF-1.  The morphology and immunophenotype are consistent with a gynecologic primary.     01/01/2021 Initial Diagnosis   Peritoneal carcinomatosis ([place]   01/08/2021 Cancer Staging   Staging form: Ovary, Fallopian Tube, and Primary Peritoneal Carcinoma, AJCC 8th Edition - Clinical stage from 01/08/2021: cT3, cN0, cM0 - Signed by GHeath Lark MD on 01/08/2021 Stage prefix: Initial diagnosis    01/12/2021 - 03/11/2021 Chemotherapy   Patient is on Treatment Plan : OVARIAN Carboplatin (AUC 6) / Paclitaxel (175) q21d x 6 cycles     01/17/2021 Imaging   MRI  brain No evidence of intracranial metastatic disease or other acute abnormality   02/12/2021 Genetic Testing   No pathogenic variants detected in [facility].  The report date is February 12, 2021.   The [facility] panel offered by [employer] sequencing and deletion/duplication testing of the following 48 genes: APC, ATM, AXIN2, BAP1, BARD1, BMPR1A, BRCA1, BRCA2, BRIP1, CHD1, CDK4, CDKN2A(p16 and p14ARF), CHEK2, CTNNA1, EGFR, EPCAM, FH, FLCN, GREM1, HOXB13, MEN1, MET, MITF , MLH1, MSH2, MSH3, MSH6, MUTYH, NTHL1, PALB2, PMS2, POLD1, POLE, PTEN, RAD51C, RAD51D, RET, SDHA, SDHB, SDHC, SDHD, SMAD4, STK11,TERT, TP53, TSC1, TSC2, and VHL.  Unless otherwise specified, all coding regions and flanking non-coding regions are analyzed for sequence variation. Analysis of flanking intronic regions typically do not extend more than 20 bp before and 10 bp after each exon, though the exact region may be adjusted based on the presence of either potentially significant variants or highly repetitive sequences. Coding regions and proximal promoter regions near the transcription start sites are analyzed for large deletions or duplications. Specific genes are tested only for sequence and/or CNVs within limited regions. Limited clinically relevant regions are included for EGFR (sequencing and CNV analysis of exons 18-21),RET (sequencing and CNV analysis of exons 5, 8, 10, 11, and 13-16),  and MITF (sequencing of position c.952). Only CNV analysis of the last two exons of EPCAM is performed. CNV analysis of GREM1 includes the upstream region overlapping the adjacent gene SCG5. MSH3 exon 1 contains a long polyalanine repeat that can interfere with variant calling; therefore, MSH3 analysis excludes c.121 to c.237. Only sequence analysis of the exons encompassing the exonuclease domains of these genes is performed (POLD1 c.841 to c.1686, POLE c.802 to c.1473). Limited promoter regions in selected  genes undergo sequence analysis including TERT (c.-71 to c.-1), and APC Promoter 1B (c.-195 to c.-190 and c.-125 ([id number]).   HRD testing pending.    03/02/2021 Imaging   Outside imaging at [facility] improved peritoneal carcinomatosis. No evidence of new metastatic disease or acute abdominal pathology.    03/02/2021 Imaging   Outside CT imaging done at [facility] improved peritoneal carcinomatosis. No evidence of new metastatic disease or acute abdominal pathology   03/02/2021 - 03/03/2021 Hospital Admission   She was hospitalized briefly for evaluation of hematochezia at [facility].  She underwent CT imaging and colonoscopy and was subsequently discharged   03/03/2021 Procedure   Colonoscopy was performed at [facility]  Findings: Diverticula were found in the entire colon. Internal hemorrhoids were found during retroflexion.  The hemorrhoids were Grade I (internal hemorrhoids that do not prolapse). Impression: - Diverticulosis in the entire examined colon. Likely source of bleeding. - Internal hemorrhoids.   04/07/2021 Surgery   PROCEDURES:  Exam under anesthesia Diagnostic laparoscopy Exploratory laparotomy Lysis of adhesions (>30 minutes) Infragastric omentectomy Total abdominal hysterectomy and bilateral salpingo-oophorectomy Optimal (R0) interval tumor debulking  SURGEON: Surgeon(s) and Role: Mellody Drown, MD - Primary * Salinaro, Whitney Muse, MD - Resident - Assisting * Riki Sheer, Owens Shark, MD - Fellow  INDICATION(S): 70 y.o. who presented with carcinomatosis and mildly elevated CA125 to 95 with biopsy showing mullerian adenocarcinoma, s/p 3 cycles of neoadjuvant chemotherapy with normalization of CA125 and decreased disease burden on imaging.  OPERATIVE FINDINGS:  On exam, no palpable masses. On laparoscopy, omentum thickened and retracted along the transverse colon, pelvic adhesive disease. On laparotomy, omentum thickened, retracted, and densely adherent to  the underlying colon mesentery, but without definitive viable tumor. Adhesions of the omentum to the descending colon, rectosigmoid colon to the left adnexa and posterior uterus and cervix, loop of ileum to the right adnexa, and bladder to anterior uterus and cervix. Transverse colon with small diverticulum that was over-sewed. Appendix normal and retrocecal. No grossly visible tumor in the abdomen including smooth diaphragm and liver surface, normal stomach, small bowel, colon, and normal appearing uterus, bilateral fallopian tubes, and bilateral ovaries.  At the conclusion of the case, no gross residual disease remained (R0).    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   05/22/2021 -  Chemotherapy   Patient is on Treatment Plan : OVARIAN Carboplatin (AUC 6) / Paclitaxel (175) q21d x 6 cycles       PHYSICAL EXAMINATION: ECOG PERFORMANCE STATUS: 1 - Symptomatic but completely ambulatory  Vitals:   05/12/21 1312  BP: 123/63  Pulse: 68  Resp: 18  Temp: 99.6 F (37.6 C)  SpO2: 97%   Filed Weights   05/12/21 1312  Weight: 237 lb 12.8 oz (107.9 kg)    GENERAL:alert, no distress and comfortable SKIN: skin color,  texture, turgor are normal, no rashes or significant lesions EYES: normal, Conjunctiva are pink and non-injected, sclera clear OROPHARYNX:no exudate, no erythema and lips, buccal mucosa, and tongue normal  NECK: supple, thyroid normal size, non-tender, without nodularity LYMPH:  no palpable lymphadenopathy in the cervical, axillary or inguinal LUNGS: clear to auscultation and percussion with normal breathing effort HEART: regular rate & rhythm and no murmurs and no lower extremity edema ABDOMEN:abdomen soft, non-tender and normal  bowel sounds.  Noted well-healed surgical scar Musculoskeletal:no cyanosis of digits and no clubbing  NEURO: alert & oriented x 3 with fluent speech, no focal motor/sensory deficits  LABORATORY DATA:  I have reviewed the data as listed    Component Value Date/Time   NA 144 03/06/2021 1307   NA 143 12/20/2019 0829   K 4.1 03/06/2021 1307   CL 106 03/06/2021 1307   CO2 28 03/06/2021 1307   GLUCOSE 99 03/06/2021 1307   BUN 12 03/06/2021 1307   BUN 16 12/20/2019 0829   CREATININE 0.66 03/06/2021 1307   CALCIUM 9.3 03/06/2021 1307   PROT 7.0 03/06/2021 1307   PROT 6.4 02/04/2020 1036   ALBUMIN 3.9 03/06/2021 1307   ALBUMIN 4.2 02/04/2020 1036   AST 22 03/06/2021 1307   ALT 21 03/06/2021 1307   ALKPHOS 65 03/06/2021 1307   BILITOT 0.5 03/06/2021 1307   GFRNONAA >60 03/06/2021 1307   GFRAA 104 12/20/2019 0829    No results found for: SPEP, UPEP  Lab Results  Component Value Date   WBC 3.7 (L) 03/06/2021   NEUTROABS 2.0 03/06/2021   HGB 10.5 (L) 03/06/2021   HCT 32.5 (L) 03/06/2021   MCV 92.1 03/06/2021   PLT 141 (L) 03/06/2021      Chemistry      Component Value Date/Time   NA 144 03/06/2021 1307   NA 143 12/20/2019 0829   K 4.1 03/06/2021 1307   CL 106 03/06/2021 1307   CO2 28 03/06/2021 1307   BUN 12 03/06/2021 1307   BUN 16 12/20/2019 0829   CREATININE 0.66 03/06/2021 1307      Component Value Date/Time   CALCIUM 9.3 03/06/2021 1307   ALKPHOS 65 03/06/2021 1307   AST 22 03/06/2021 1307   ALT 21 03/06/2021 1307   BILITOT 0.5 03/06/2021 1307

## 2021-05-12 NOTE — Assessment & Plan Note (Signed)
She has generalized deconditioning but overall stable/improving She will resume physical therapy as scheduled

## 2021-05-15 ENCOUNTER — Ambulatory Visit: Payer: Medicare Other

## 2021-05-15 ENCOUNTER — Ambulatory Visit: Payer: Medicare Other | Admitting: Hematology and Oncology

## 2021-05-15 ENCOUNTER — Other Ambulatory Visit: Payer: Medicare Other

## 2021-05-15 NOTE — Progress Notes (Signed)
Pharmacist Chemotherapy Monitoring - Initial Assessment    Anticipated start date: 05/22/21   The following has been reviewed per standard work regarding the patient's treatment regimen: The patient's diagnosis, treatment plan and drug doses, and organ/hematologic function Lab orders and baseline tests specific to treatment regimen  The treatment plan start date, drug sequencing, and pre-medications Prior authorization status  Patient's documented medication list, including drug-drug interaction screen and prescriptions for anti-emetics and supportive care specific to the treatment regimen The drug concentrations, fluid compatibility, administration routes, and timing of the medications to be used The patient's access for treatment and lifetime cumulative dose history, if applicable  The patient's medication allergies and previous infusion related reactions, if applicable   Changes made to treatment plan:  N/A  Follow up needed:  Pending authorization for treatment    Judge Stall, [facility], 05/15/2021  8:38 AM

## 2021-05-15 NOTE — Progress Notes (Addendum)
Progress note opened in error

## 2021-05-19 ENCOUNTER — Ambulatory Visit: Payer: Medicare Other

## 2021-05-19 ENCOUNTER — Other Ambulatory Visit: Payer: Self-pay

## 2021-05-19 DIAGNOSIS — M6281 Muscle weakness (generalized): Secondary | ICD-10-CM | POA: Diagnosis not present

## 2021-05-19 DIAGNOSIS — R293 Abnormal posture: Secondary | ICD-10-CM

## 2021-05-19 DIAGNOSIS — C762 Malignant neoplasm of abdomen: Secondary | ICD-10-CM

## 2021-05-19 DIAGNOSIS — C786 Secondary malignant neoplasm of retroperitoneum and peritoneum: Secondary | ICD-10-CM

## 2021-05-19 NOTE — Therapy (Signed)
[facility] @ [place] [place], [place], [address] Phone: [phone]   Fax:  [phone]  Physical Therapy Treatment  Patient Details  Name: Brittany Middleton MRN: [id number] Date of Birth: [birth year] Referring Provider (PT): Dr. 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 Last Assessment & Plan:  Formatting of this note might be different from the original. - continue home meds   Family history of breast cancer 01/27/2021   Family history of lung cancer 01/27/2021   Gastrointestinal bleeding 07/25/2018   Gastrointestinal hemorrhage with melena 07/16/2018   Genetic testing 02/13/2021   No pathogenic variants detected in [employer] [facility] [facility].  The report date is February 12, 2021.   The [facility] gene panel offered by [facility] includes sequencing and deletion/duplication testing of the following 48 genes: APC, ATM, AXIN2, BAP1,  BARD1, BMPR1A, BRCA1, BRCA2, BRIP1, CHD1, CDK4, CDKN2A(p16 and p14ARF), CHEK2, CTNNA1, EGFR, EPCAM, FH, FLCN, GREM1, HOXB13, MEN1, M   GERD (gastroesophageal reflux disease)    Hematuria 06/19/2020   Hepatic steatosis    per imaging 2016, 2019   Hiatal hernia    History of palpitations    Holter monitor, 2017: NSR, occasional PVC, PACs, no arrhythmia   HLD (hyperlipidemia) 02/27/2018   HTN (hypertension) 02/27/2018   Hypercholesteremia    Hypercholesterolemia 04/07/2017   Hyperglycemia 06/19/2020   Hypertension    Hypertensive disorder 04/07/2017   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   Hypokalemia 07/17/2018   Lumbar disc disease    MRI, 2017    Obesity, Class III, BMI 40-49.9 (morbid obesity) ([place]) 05/22/2018   Overweight 03/03/2018   Palpitations 08/19/2015   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[place])    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   Physical debility 01/10/2021   Pneumonia    Pure hypercholesterolemia, unspecified 04/07/2017   Pyelonephritis 03/30/2020   Recurrent UTI 05/28/2020   SBO (small bowel obstruction) ([place]) 12/28/2020   Sepsis secondary to UTI ([place]) 04/25/2020   Severe sepsis ([place]) 05/21/2020   Severe sepsis with acute organ dysfunction ([place]) 04/25/2020   Skin infection 02/05/2021   Skin irritation 02/05/2021   Sleep apnea    Urinary tract infection due to extended-spectrum beta lactamase (ESBL) producing Escherichia coli    Uterine cancer ([place]) 01/01/2021    Past Surgical History:  Procedure Laterality Date   BLADDER REPAIR     BREAST EXCISIONAL BIOPSY Left    CARPAL TUNNEL RELEASE Right    CATARACT EXTRACTION, BILATERAL  2014   COLONOSCOPY WITH PROPOFOL N/A 07/18/2018   Procedure: COLONOSCOPY WITH PROPOFOL;  Surgeon: Laurence Spates, MD;  Location: [place];  Service: Endoscopy;  Laterality: N/A;   ESOPHAGOGASTRODUODENOSCOPY (EGD) WITH PROPOFOL N/A 07/17/2018   Procedure: ESOPHAGOGASTRODUODENOSCOPY (EGD) WITH PROPOFOL;  Surgeon: Laurence Spates, MD;  Location: [facility];  Service: Endoscopy;  Laterality: N/A;   LAPAROSCOPY N/A 12/30/2020   Procedure: LAPAROSCOPY DIAGNOSTIC; WITH  BIOPSY OF ABDOMINAL WALL NODULES AND BIOPSY OF PERICOLONIC EXUDATE;  Surgeon: Kinsinger, Arta Bruce, MD;  Location: [facility];  Service: General;  Laterality: N/A;   PORTACATH PLACEMENT N/A 01/09/2021   Procedure: PORT INSERTION WITH [place] & FLUORO;  Surgeon: Kieth Brightly Arta Bruce, MD;  Location: [facility];  Service: General;  Laterality: N/A;   REPAIR RECTOCELE     TOE SURGERY Left     There were no vitals filed for this visit.   Subjective Assessment - 05/19/21 1009     Subjective I am feeling much better. Still get occasional twinges but I have been able to bend and stretch without too much trouble.  I do get some cramping in the early am when in bed around 4 AM even before surgery. It gets better if I sit in my recliner with the left leg up.   Its sore and tender at my left side of LB. I have to get in the recliner to make it go away.  It feels like a muscle spasm. I get it most nights. I may take 2 tylenol in the am and at night, but no more dilaudid. I have chemo this Friday and Dec 30. I am feeling stronger. My left leg is stronger, but when I fatigue I feel like I will trip at the front of my left foot.    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 She is presently undergoing chemotherapy with Carboplatin and Paclitaxel every 3 weeeks and has 3 more cycles.  Planning complete hysterectomy for 03/24/21-03/26/2021 and likely to resume chemo on 04/13/2021 if all goes well    Currently in Pain? No/denies    Pain Score 0-No pain                [facility] PT Assessment - 05/19/21 0001       Strength   Right Hip Flexion 4/5    Right Hip Extension --   able to bridge   Right Hip External Rotation  4+/5    Right Hip Internal Rotation 4/5    Right Hip ABduction 4/5    Left Hip Flexion 4+/5    Left Hip Extension --   able to  bridge   Left Hip External Rotation 4+/5    Left Hip ABduction 4/5    Right Knee Flexion 5/5    Right Knee Extension 4+/5    Left Knee Flexion 5/5    Left Knee Extension 4+/5    Right Ankle Dorsiflexion 5/5    Right Ankle Plantar Flexion 4+/5    Left Ankle Dorsiflexion 5/5    Left Ankle Plantar Flexion 5/5      Flexibility   Soft Tissue Assessment /Muscle Length yes    Piriformis tight iblaterally      Transfers   Comments 30 sec sit to stand 11      High Level Balance  High Level Balance Comments able to maintain bilateral stance x 10 secs, tandem stance right forward x 10, left foot forward x 3 seconds, SLS limited bilaterally greatest on the left,                           [facility] Adult PT Treatment/Exercise - 05/19/21 0001       Ambulation/Gait   Gait Comments TUG 9 secs., ambulates with flexed trunk and slight right lean, with normal base of support which widens as she fatigues.                     PT Education - 05/19/21 1057     Education Details educated in LTR stretch for left lateral trunk, and Figure 4 stretch    Person(s) Educated Patient    Methods Explanation;Handout    Comprehension Returned demonstration                 PT Long Term Goals - 02/20/21 2016       PT LONG TERM GOAL #1   Title Pt will be independent with HEP for generalized stregth, flexibility and balance training    Time 4    Period Weeks    Status New    Target Date 03/20/21      PT LONG TERM GOAL #2   Title Pt will perform 12 sit to stands in 30 seconds for decreased risk of falls    Baseline 9    Time 4    Period Weeks    Status New    Target Date 03/20/21      PT LONG TERM GOAL #3   Title Pt will be able to maintain bilateral SLS for greater than 10 seconds each without excessive use of arms to maintain balance    Time 4    Period Weeks    Status New    Target Date 03/20/21      PT LONG TERM GOAL #4   Title Pt will report  improved  heel toe gait pattern after walking short periods    Time 4    Period Weeks    Status New    Target Date 03/20/21                   Plan - 05/19/21 1210     Clinical Impression Statement Pt is moving much better than when she was seen 3 weeks ago and has greatly decreased pain in left abdominal region but is still wearing a binder.  Balance was assessed today and she has considerable difficulty with tandem stance and SLS activities on both sides.  She has decreased flexibility in bilateral piriformis, and left lateral back.  There is significant tenderness at left lateral mid back as well where she gets daily muscle spasms.  She did well with her TUG test but did have a LOB in the practice TUG. she had 11 reps in the 30 second sit to stand. There is weakness noted especially with bilateral hip flexors, abd, IR and ER.    Personal Factors and Comorbidities Comorbidity 3+    Comorbidities s/p total hysterectomy 04/07/2021, aortic aneurysm, prior severe sepsis with acute organ failure, HBP    Examination-Activity Limitations Locomotion Level;Reach Overhead;Bend;Squat;Dressing;Stairs    Stability/Clinical Decision Making Evolving/Moderate complexity    Rehab Potential Good    PT Frequency 2x / week    PT Duration 8 weeks    PT  Treatment/Interventions ADLs/Self Care Home Management;Stair training;Gait training;Therapeutic activities;Therapeutic exercise;Balance training;Neuromuscular re-education;Manual techniques;Patient/family education    PT Next Visit Plan balance exs, hip abd, flexor strength, core strength,STM to left lateral back area of spasm    PT Home Exercise Plan [email or web address]    Consulted and Agree with Plan of Care Patient             Patient will benefit from skilled therapeutic intervention in order to improve the following deficits and impairments:  Decreased activity tolerance, Decreased balance, Decreased knowledge of precautions, Decreased endurance, Decreased range of  motion, Decreased strength, Postural dysfunction, Pain, Decreased mobility  Visit Diagnosis: Muscle weakness (generalized)  Abnormal posture  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[place])  Malignant neoplasm of abdomen [facility])     Problem List Patient Active Problem List   Diagnosis Date Noted   Postoperative pain 04/21/2021   Weight loss, abnormal 04/21/2021   Pancytopenia, acquired ([place]) 03/06/2021   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   Coronary artery calcification 03/05/2021   Arthritis 03/04/2021   GERD (gastroesophageal reflux disease) 03/04/2021   Pneumonia 03/04/2021   Genetic testing 02/13/2021   Family history of breast cancer 01/27/2021   Family history of lung cancer 01/27/2021   Dysuria 01/26/2021   Physical deconditioning 01/10/2021   Diplopia 01/09/2021   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   Dyspnea 06/19/2020   Epigastric pain 06/19/2020   Hematuria 06/19/2020   Hyperglycemia 06/19/2020   Abdominal pain 05/28/2020   Recurrent UTI 05/28/2020   Asthma    Sleep apnea    Cervical disc disease    Hepatic steatosis    History of palpitations    Hypercholesteremia    Hypertension    Lumbar disc disease    Pyelonephritis 03/30/2020   Gastrointestinal bleeding 07/25/2018   Hiatal hernia    Anemia 07/17/2018   Acute blood loss anemia 07/17/2018   Hypokalemia 07/17/2018   Gastrointestinal hemorrhage with melena 07/16/2018   Obesity, Class III, BMI 40-49.9 (morbid obesity) ([place]) 05/22/2018   Overweight 03/03/2018   Coronary artery aneurysm 03/03/2018   Chest pain with moderate risk for cardiac etiology 02/27/2018   HTN (hypertension) 02/27/2018   HLD (hyperlipidemia) 02/27/2018   Hypercholesterolemia 04/07/2017   Hypertensive disorder 04/07/2017   Pure hypercholesterolemia, unspecified 04/07/2017   Essential (primary)  hypertension 04/07/2017   Palpitations 08/19/2015    Claris Pong, PT 05/19/2021, 12:22 PM  [place] @ [address] [place], [place], [address] Phone: [phone]   Fax:  [phone]  Name: Brittany Middleton MRN: [id number] Date of Birth: 1950/09/30

## 2021-05-19 NOTE — Patient Instructions (Signed)
Access Code: [email or web address] URL: [email or web address] Date: 05/19/2021 Prepared by: Cheral Almas  Exercises Supine Lower Trunk Rotation - 1 x daily - 7 x weekly - 3-5 reps - 10-15 hold Supine Figure 4 Piriformis Stretch - 1 x daily - 7 x weekly - 1 sets - 3 reps - 15 hold

## 2021-05-21 MED FILL — Dexamethasone Sodium Phosphate Inj 100 MG/10ML: INTRAMUSCULAR | Qty: 1 | Status: AC

## 2021-05-21 MED FILL — Fosaprepitant Dimeglumine For IV Infusion 150 MG (Base Eq): INTRAVENOUS | Qty: 5 | Status: AC

## 2021-05-22 ENCOUNTER — Other Ambulatory Visit: Payer: Self-pay | Admitting: Hematology and Oncology

## 2021-05-22 ENCOUNTER — Inpatient Hospital Stay: Payer: Medicare Other

## 2021-05-22 ENCOUNTER — Other Ambulatory Visit: Payer: Self-pay

## 2021-05-22 ENCOUNTER — Inpatient Hospital Stay: Payer: Medicare Other | Attending: Hematology and Oncology | Admitting: Hematology and Oncology

## 2021-05-22 ENCOUNTER — Encounter: Payer: Self-pay | Admitting: Hematology and Oncology

## 2021-05-22 DIAGNOSIS — Z79899 Other long term (current) drug therapy: Secondary | ICD-10-CM | POA: Diagnosis not present

## 2021-05-22 DIAGNOSIS — C5701 Malignant neoplasm of right fallopian tube: Secondary | ICD-10-CM

## 2021-05-22 DIAGNOSIS — Z5111 Encounter for antineoplastic chemotherapy: Secondary | ICD-10-CM | POA: Insufficient documentation

## 2021-05-22 DIAGNOSIS — K5730 Diverticulosis of large intestine without perforation or abscess without bleeding: Secondary | ICD-10-CM | POA: Diagnosis not present

## 2021-05-22 DIAGNOSIS — C762 Malignant neoplasm of abdomen: Secondary | ICD-10-CM

## 2021-05-22 DIAGNOSIS — T451X5A Adverse effect of antineoplastic and immunosuppressive drugs, initial encounter: Secondary | ICD-10-CM

## 2021-05-22 DIAGNOSIS — D6481 Anemia due to antineoplastic chemotherapy: Secondary | ICD-10-CM | POA: Insufficient documentation

## 2021-05-22 DIAGNOSIS — K648 Other hemorrhoids: Secondary | ICD-10-CM | POA: Insufficient documentation

## 2021-05-22 DIAGNOSIS — C786 Secondary malignant neoplasm of retroperitoneum and peritoneum: Secondary | ICD-10-CM | POA: Insufficient documentation

## 2021-05-22 DIAGNOSIS — J90 Pleural effusion, not elsewhere classified: Secondary | ICD-10-CM | POA: Insufficient documentation

## 2021-05-22 DIAGNOSIS — K921 Melena: Secondary | ICD-10-CM | POA: Diagnosis not present

## 2021-05-22 LAB — CMP (CANCER CENTER ONLY)
ALT: 21 U/L (ref 0–44)
AST: 16 U/L (ref 15–41)
Albumin: 3.5 g/dL (ref 3.5–5.0)
Alkaline Phosphatase: 92 U/L (ref 38–126)
Anion gap: 10 (ref 5–15)
BUN: 15 mg/dL (ref 8–23)
CO2: 24 mmol/L (ref 22–32)
Calcium: 9.1 mg/dL (ref 8.9–10.3)
Chloride: 107 mmol/L (ref 98–111)
Creatinine: 0.67 mg/dL (ref 0.44–1.00)
GFR, Estimated: >60 mL/min (ref >60)
Glucose, Bld: 147 mg/dL — ABNORMAL HIGH (ref 70–99)
Potassium: 3.9 mmol/L (ref 3.5–5.1)
Sodium: 141 mmol/L (ref 135–145)
Total Bilirubin: 0.2 mg/dL — ABNORMAL LOW (ref 0.3–1.2)
Total Protein: 7.2 g/dL (ref 6.5–8.1)

## 2021-05-22 LAB — CBC WITH DIFFERENTIAL (CANCER CENTER ONLY)
Abs Immature Granulocytes: 0.09 10*3/uL — ABNORMAL HIGH (ref 0.00–0.07)
Basophils Absolute: 0 10*3/uL (ref 0.0–0.1)
Basophils Relative: 0 %
Eosinophils Absolute: 0 10*3/uL (ref 0.0–0.5)
Eosinophils Relative: 0 %
HCT: 30.1 % — ABNORMAL LOW (ref 36.0–46.0)
Hemoglobin: 9.3 g/dL — ABNORMAL LOW (ref 12.0–15.0)
Immature Granulocytes: 2 %
Lymphocytes Relative: 8 %
Lymphs Abs: 0.5 10*3/uL — ABNORMAL LOW (ref 0.7–4.0)
MCH: 29 pg (ref 26.0–34.0)
MCHC: 30.9 g/dL (ref 30.0–36.0)
MCV: 93.8 fL (ref 80.0–100.0)
Monocytes Absolute: 0.1 10*3/uL (ref 0.1–1.0)
Monocytes Relative: 2 %
Neutro Abs: 4.9 10*3/uL (ref 1.7–7.7)
Neutrophils Relative %: 88 %
Platelet Count: 206 10*3/uL (ref 150–400)
RBC: 3.21 MIL/uL — ABNORMAL LOW (ref 3.87–5.11)
RDW: 15.5 % (ref 11.5–15.5)
WBC Count: 5.6 10*3/uL (ref 4.0–10.5)
nRBC: 0 % (ref 0.0–0.2)

## 2021-05-22 MED ORDER — SODIUM CHLORIDE 0.9% FLUSH
10.0000 mL | Freq: Once | INTRAVENOUS | Status: AC
  Administered 2021-05-22: 10 mL

## 2021-05-22 MED ORDER — SODIUM CHLORIDE 0.9 % IV SOLN: Freq: Once | INTRAVENOUS | Status: AC

## 2021-05-22 MED ORDER — SODIUM CHLORIDE 0.9 % IV SOLN
150.0000 mg | Freq: Once | INTRAVENOUS | Status: AC
  Administered 2021-05-22: 150 mg via INTRAVENOUS
  Filled 2021-05-22: qty 150

## 2021-05-22 MED ORDER — DIPHENHYDRAMINE HCL 50 MG/ML IJ SOLN
12.5000 mg | Freq: Once | INTRAMUSCULAR | Status: AC
  Administered 2021-05-22: 12.5 mg via INTRAVENOUS
  Filled 2021-05-22: qty 1

## 2021-05-22 MED ORDER — PALONOSETRON HCL INJECTION 0.25 MG/5ML
0.2500 mg | Freq: Once | INTRAVENOUS | Status: AC
  Administered 2021-05-22: 0.25 mg via INTRAVENOUS
  Filled 2021-05-22: qty 5

## 2021-05-22 MED ORDER — SODIUM CHLORIDE 0.9% FLUSH
10.0000 mL | INTRAVENOUS | Status: DC | PRN
  Administered 2021-05-22: 10 mL

## 2021-05-22 MED ORDER — SODIUM CHLORIDE 0.9 % IV SOLN
750.0000 mg | Freq: Once | INTRAVENOUS | Status: AC
  Administered 2021-05-22: 750 mg via INTRAVENOUS
  Filled 2021-05-22: qty 75

## 2021-05-22 MED ORDER — SODIUM CHLORIDE 0.9 % IV SOLN
10.0000 mg | Freq: Once | INTRAVENOUS | Status: AC
  Administered 2021-05-22: 10 mg via INTRAVENOUS
  Filled 2021-05-22: qty 10

## 2021-05-22 MED ORDER — HEPARIN SOD (PORK) LOCK FLUSH 100 UNIT/ML IV SOLN
500.0000 [IU] | Freq: Once | INTRAVENOUS | Status: AC | PRN
  Administered 2021-05-22: 500 [IU]

## 2021-05-22 MED ORDER — FAMOTIDINE 20 MG IN NS 100 ML IVPB
20.0000 mg | Freq: Once | INTRAVENOUS | Status: AC
  Administered 2021-05-22: 20 mg via INTRAVENOUS
  Filled 2021-05-22: qty 100

## 2021-05-22 MED ORDER — SODIUM CHLORIDE 0.9 % IV SOLN
175.0000 mg/m2 | Freq: Once | INTRAVENOUS | Status: AC
  Administered 2021-05-22: 330 mg via INTRAVENOUS
  Filled 2021-05-22: qty 55

## 2021-05-22 NOTE — Patient Instructions (Signed)
[facility]   Discharge Instructions: Thank you for choosing [facility] to provide your [facility] and hematology care.   If you have a lab appointment with the [facility], please go directly to the [facility] and check in at the registration area.   Wear comfortable clothing and clothing appropriate for easy access to any Portacath or PICC line.   We strive to give you quality time with your provider. You may need to reschedule your appointment if you arrive late (15 or more minutes).  Arriving late affects you and other patients whose appointments are after yours.  Also, if you miss three or more appointments without notifying the office, you may be dismissed from the clinic at the provider's discretion.      For prescription refill requests, have your pharmacy contact our office and allow 72 hours for refills to be completed.    Today you received the following chemotherapy and/or immunotherapy agents: paclitaxel and carboplatin.      To help prevent nausea and vomiting after your treatment, we encourage you to take your nausea medication as directed.  BELOW ARE SYMPTOMS THAT SHOULD BE REPORTED IMMEDIATELY: *FEVER GREATER THAN 100.4 F (38 C) OR HIGHER *CHILLS OR SWEATING *NAUSEA AND VOMITING THAT IS NOT CONTROLLED WITH YOUR NAUSEA MEDICATION *UNUSUAL SHORTNESS OF BREATH *UNUSUAL BRUISING OR BLEEDING *URINARY PROBLEMS (pain or burning when urinating, or frequent urination) *BOWEL PROBLEMS (unusual diarrhea, constipation, pain near the anus) TENDERNESS IN MOUTH AND THROAT WITH OR WITHOUT PRESENCE OF ULCERS (sore throat, sores in mouth, or a toothache) UNUSUAL RASH, SWELLING OR PAIN  UNUSUAL VAGINAL DISCHARGE OR ITCHING   Items with * indicate a potential emergency and should be followed up as soon as possible or go to the Emergency Department if any problems should occur.  Please show the CHEMOTHERAPY ALERT CARD or IMMUNOTHERAPY ALERT  CARD at check-in to the Emergency Department and triage nurse.  Should you have questions after your visit or need to cancel or reschedule your appointment, please contact [facility]  Dept: [phone]  and follow the prompts.  Office hours are 8:00 a.m. to 4:30 p.m. Monday - Friday. Please note that voicemails left after 4:00 p.m. may not be returned until the following business day.  We are closed weekends and major holidays. You have access to a nurse at all times for urgent questions. Please call the main number to the clinic Dept: [phone] and follow the prompts.   For any non-urgent questions, you may also contact your provider using MyChart. We now offer e-Visits for anyone 18 and older to request care online for non-urgent symptoms. For details visit [email or web address].   Also download the MyChart app! Go to the app store, search "MyChart", open the app, select [facility], and log in with your MyChart username and password.  Due to Covid, a mask is required upon entering the hospital/clinic. If you do not have a mask, one will be given to you upon arrival. For doctor visits, patients may have 1 support person aged 18 or older with them. For treatment visits, patients cannot have anyone with them due to current Covid guidelines and our immunocompromised population.   

## 2021-05-22 NOTE — Assessment & Plan Note (Signed)
She is recovering well from recent surgery We will proceed with chemotherapy as scheduled She might experience more fatigue due to anemia from her last cycle of treatment

## 2021-05-22 NOTE — Assessment & Plan Note (Signed)

## 2021-05-22 NOTE — Progress Notes (Signed)
[place] OFFICE PROGRESS NOTE  Patient Care Team: [name], MD as PCP - General (Internal Medicine) Revankar, [name], MD as PCP - Cardiology (Cardiology) [name], MD as Consulting Physician (Cardiology)  ASSESSMENT & PLAN:  Adenocarcinoma of right fallopian tube [facility] [place]) She is recovering well from recent surgery We will proceed with chemotherapy as scheduled She might experience more fatigue due to anemia from her last cycle of treatment  Anemia due to antineoplastic chemotherapy This is likely due to recent treatment. The patient denies recent history of bleeding such as epistaxis, hematuria or hematochezia. She is asymptomatic from the anemia. I will observe for now.  She does not require transfusion now. I will continue the chemotherapy at current dose without dosage adjustment.  If the anemia gets progressive worse in the future, I might have to delay her treatment or adjust the chemotherapy dose.   No orders of the defined types were placed in this encounter.   All questions were answered. The patient knows to call the clinic with any problems, questions or concerns. The total time spent in the appointment was 20 minutes encounter with patients including review of chart and various tests results, discussions about plan of care and coordination of care plan   [name], MD 05/22/2021 11:37 AM  INTERVAL HISTORY: Please see below for problem oriented charting. she returns for treatment follow-up for cycle 5 of carboplatin and paclitaxel for fallopian tube cancer She is doing well She is recovering well from surgery She has not taken any pain medicine recently She is doing well with physical therapy and rehab No peripheral neuropathy  REVIEW OF SYSTEMS:   Constitutional: Denies fevers, chills or abnormal weight loss Eyes: Denies blurriness of vision Ears, nose, mouth, throat, and face: Denies mucositis or sore throat Respiratory:  Denies cough, dyspnea or wheezes Cardiovascular: Denies palpitation, chest discomfort or lower extremity swelling Gastrointestinal:  Denies nausea, heartburn or change in bowel habits Skin: Denies abnormal skin rashes Lymphatics: Denies new lymphadenopathy or easy bruising Neurological:Denies numbness, tingling or new weaknesses Behavioral/Psych: Mood is stable, no new changes  All other systems were reviewed with the patient and are negative.  I have reviewed the past medical history, past surgical history, social history and family history with the patient and they are unchanged from previous note.  ALLERGIES:  is allergic to macrobid [nitrofurantoin], ciprofloxacin, codeine, and azithromycin.  MEDICATIONS:  Current Outpatient Medications  Medication Sig Dispense Refill   acetaminophen (TYLENOL) 500 MG tablet Take 1,000 mg by mouth every 6 (six) hours as needed for mild pain, moderate pain, fever or headache.     albuterol (PROVENTIL) (2.5 MG/3ML) 0.083% nebulizer solution Take 2.5 mg by nebulization every 6 (six) hours as needed for wheezing or shortness of breath.     alclomethasone (ACLOVATE) 0.05 % ointment Apply 1 application topically 2 (two) times daily.     BIOTIN PO Take 1 tablet by mouth daily.     Cholecalciferol (VITAMIN D-3) 25 MCG (1000 UT) CAPS Take 1,000 Units by mouth daily.     co-enzyme Q-10 30 MG capsule Take 30 mg by mouth daily.     dexamethasone (DECADRON) 4 MG tablet Take 2 tabs at the night before and 2 tab the morning of chemotherapy, every 3 weeks, by mouth x 6 cycles 36 tablet 6   estradiol (ESTRACE) 0.1 MG/GM vaginal cream Place 1 Applicatorful vaginally 3 (three) times a week.     HYDROmorphone (DILAUDID) 2 MG tablet Take 1  tablet (2 mg total) by mouth every 6 (six) hours as needed for severe pain. 60 tablet 0   lidocaine-prilocaine (EMLA) cream Apply to affected area once 30 g 3   Melatonin 5 MG CHEW Chew 5 mg by mouth at bedtime.      Multiple  Vitamins-Minerals (MULTIVITAMIN WITH MINERALS) tablet Take 1 tablet by mouth daily.     ondansetron (ZOFRAN) 8 MG tablet Take 1 tablet (8 mg total) by mouth every 8 (eight) hours as needed for refractory nausea / vomiting. 30 tablet 1   pantoprazole (PROTONIX) 40 MG tablet Take 1 tablet (40 mg total) by mouth daily. 30 tablet 1   polyethylene glycol (MIRALAX / GLYCOLAX) 17 g packet Take 8.5 g by mouth 2 (two) times daily.     rosuvastatin (CRESTOR) 20 MG tablet TAKE ONE TABLET BY MOUTH ONE TIME DAILY 90 tablet 0   No current facility-administered medications for this visit.   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 - Diverticulosis in the sigmoid colon and in the descending colon. - No specimens collected. - Blood in stool without cause found on endoscopic exam   05/08/2020 Imaging   1. Bladder is decompressed though demonstrates significant perivesicular hazy stranding, mucosal hyperemia and edematous mural thickening. Findings are suggestive of cystitis. Correlate with urinalysis. No abnormal perinephric or periureteral stranding or other features to suggest an ascending tract infection at this time. 2.  Colonic diverticulosis without evidence of acute diverticulitis. 3. Aortic Atherosclerosis (ICD10-I70.0).   12/28/2020 Imaging   1. 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 Differential for peritoneal carcinomatosis is primarily neoplastic and includes cancers of the ovary, appendix, colon, pancreas and stomach. PET-CT may be helpful for further characterization. Tissue sampling of the mesentery may be eventually required for diagnosis. 2. Mildly distended small bowel loops throughout the abdomen and pelvis, with fluid and associated air-fluid levels throughout the nondistended small bowel, suggesting a mild ileus versus partial small bowel obstruction. Favor partial small bowel obstruction with transition zone in the RIGHT lower quadrant, likely related to aforementioned neoplastic peritoneal implants and/or adhesions. 3. Small free fluid within the RIGHT upper quadrant and LEFT upper quadrant. No abscess collection seen. No free intraperitoneal air seen. 4. Extensive colonic diverticulosis without evidence of acute diverticulitis. 5. Small chronic pleural effusions with associated atelectasis.   12/29/2020 Tumor Marker   Patient's tumor was tested for the following markers: CA-125. Results of the tumor marker test revealed 95.1.   12/30/2020 Surgery   Preoperative diagnosis: abdominal nodules   Postoperative diagnosis: same   Procedure: diagnostic laparoscopy with abdominal wall biopsy  Surgeon: Gurney Maxin, M.D.    Indications for procedure: Brittany Middleton is a 70 y.o. year old female with symptoms of nausea, vomiting, and abdominal pain. Work up was concerning for cancer with abdominal wall studding.   Description of procedure: The patient was brought into the operative suite. Anesthesia was administered with General  endotracheal anesthesia. WHO checklist was applied. The patient was then placed in supine position. The area was prepped and draped in the usual sterile fashion.   A small left subcostal incision was made. A 51m trocar was used to gain access to the peritoneal cavity by optical entry technique. Pneumoperitoneum was applied with a high flow and low pressure. The laparoscope was reinserted to confirm position.   On initial visualization of the abdomen, there were multiple nodules throughout the abdominal wall. There were multiple nodules over the small intestine and mesentery. There was some loops of small intestine that were matted together. There was a large white mass in the left upper quadrant. Multiple areas of peritoneum were removed with harmonic scalpel. The large mass was inspected. It appeared to be related to the colon and omentum. Grasper was used to remove some superficial tissue and was sent for culture.   The abdomen was desufflated. The incisions were closed with 4-0 monocryl subcuticular stitch.   12/30/2020 Pathology Results   FINAL MICROSCOPIC DIAGNOSIS:   A. ABDOMINAL WALL, NODULE, EXCISION:  -  Metastatic adenocarcinoma  -  See comment   B. PERI COLONIC EXUDATE, EXCISION:  -  Metastatic adenocarcinoma  -  See comment   COMMENT:   By immunohistochemistry, the neoplastic cells are positive for cytokeratin 7, PAX8 and WT1 but negative for cytokeratin 20, CDX2, p53 and TTF-1.  The morphology and immunophenotype are consistent with a gynecologic primary.     01/01/2021 Initial Diagnosis   [place]   01/08/2021 Cancer Staging   Staging form: Ovary, Fallopian Tube, and Primary Peritoneal Carcinoma, AJCC 8th Edition - Clinical stage from 01/08/2021: cT3, cN0, cM0 - Signed by GHeath Lark MD on 01/08/2021 Stage prefix: Initial diagnosis    01/12/2021 - 03/11/2021 Chemotherapy   Patient is on Treatment Plan : OVARIAN Carboplatin (AUC 6) / Paclitaxel (175)  q21d x 6 cycles     01/17/2021 Imaging   MRI brain No evidence of intracranial metastatic disease or other acute abnormality   02/12/2021 Genetic Testing   No pathogenic variants detected in [facility].  The report date is February 12, 2021.   The [facility]gene panel offered by [employer]includes sequencing and deletion/duplication testing of the following 48 genes: APC, ATM, AXIN2, BAP1, BARD1, BMPR1A, BRCA1, BRCA2, BRIP1, CHD1, CDK4, CDKN2A(p16 and p14ARF), CHEK2, CTNNA1, EGFR, EPCAM, FH, FLCN, GREM1, HOXB13, MEN1, MET, MITF , MLH1, MSH2, MSH3, MSH6, MUTYH, NTHL1, PALB2, PMS2, POLD1, POLE, PTEN, RAD51C, RAD51D, RET, SDHA, SDHB, SDHC, SDHD, SMAD4, STK11,TERT, TP53, TSC1, TSC2, and VHL.  Unless otherwise specified, all coding regions and flanking non-coding regions are analyzed for sequence variation. Analysis of flanking intronic regions typically do not extend more than 20 bp before and 10 bp after each exon, though the exact region may be adjusted based on the presence of either potentially significant variants or highly repetitive sequences. Coding regions and proximal promoter regions near the transcription start sites are analyzed for large deletions or duplications. Specific genes are tested only for sequence and/or CNVs within limited regions. Limited clinically relevant regions are included for EGFR (sequencing and CNV analysis of exons  18-21),RET (sequencing and CNV analysis of exons 5, 8, 10, 11, and 13-16), and MITF (sequencing of position c.952). Only CNV analysis of the last two exons of EPCAM is performed. CNV analysis of GREM1 includes the upstream region overlapping the adjacent gene SCG5. MSH3 exon 1 contains a long polyalanine repeat that can interfere with variant calling; therefore, MSH3 analysis excludes c.121 to c.237. Only sequence analysis of the exons encompassing the exonuclease domains of these genes is performed (POLD1 c.841 to c.1686, POLE c.802  to c.1473). Limited promoter regions in selected genes undergo sequence analysis including TERT (c.-71 to c.-1), and APC Promoter 1B (c.-195 to c.-190 and c.-125 ([id number]).   HRD testing pending.    03/02/2021 Imaging   Outside imaging at [facility] improved peritoneal carcinomatosis. No evidence of new metastatic disease or acute abdominal pathology.    03/02/2021 Imaging   Outside CT imaging done at [facility] improved peritoneal carcinomatosis. No evidence of new metastatic disease or acute abdominal pathology   03/02/2021 - 03/03/2021 Hospital Admission   She was hospitalized briefly for evaluation of hematochezia at [facility].  She underwent CT imaging and colonoscopy and was subsequently discharged   03/03/2021 Procedure   Colonoscopy was performed at [facility]  Findings: Diverticula were found in the entire colon. Internal hemorrhoids were found during retroflexion.  The hemorrhoids were Grade I (internal hemorrhoids that do not prolapse). Impression: - Diverticulosis in the entire examined colon. Likely source of bleeding. - Internal hemorrhoids.   04/07/2021 Surgery   PROCEDURES:  Exam under anesthesia Diagnostic laparoscopy Exploratory laparotomy Lysis of adhesions (>30 minutes) Infragastric omentectomy Total abdominal hysterectomy and bilateral salpingo-oophorectomy Optimal (R0) interval tumor debulking  SURGEON: Surgeon(s) and Role: Mellody Drown, MD - Primary * Salinaro, Whitney Muse, MD - Resident - Assisting * Riki Sheer, Owens Shark, MD - Fellow  INDICATION(S): 70 y.o. who presented with carcinomatosis and mildly elevated CA125 to 95 with biopsy showing mullerian adenocarcinoma, s/p 3 cycles of neoadjuvant chemotherapy with normalization of CA125 and decreased disease burden on imaging.  OPERATIVE FINDINGS:  On exam, no palpable masses. On laparoscopy, omentum thickened and retracted along the transverse colon, pelvic adhesive disease. On laparotomy,  omentum thickened, retracted, and densely adherent to the underlying colon mesentery, but without definitive viable tumor. Adhesions of the omentum to the descending colon, rectosigmoid colon to the left adnexa and posterior uterus and cervix, loop of ileum to the right adnexa, and bladder to anterior uterus and cervix. Transverse colon with small diverticulum that was over-sewed. Appendix normal and retrocecal. No grossly visible tumor in the abdomen including smooth diaphragm and liver surface, normal stomach, small bowel, colon, and normal appearing uterus, bilateral fallopian tubes, and bilateral ovaries.  At the conclusion of the case, no gross residual disease remained (R0).    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   05/22/2021 -  Chemotherapy   Patient is on Treatment Plan : OVARIAN Carboplatin (AUC 6) / Paclitaxel (175) q21d x 6 cycles       PHYSICAL EXAMINATION: ECOG PERFORMANCE STATUS: 1 - Symptomatic but completely ambulatory  Vitals:   05/22/21 1105  BP: (!) 147/68  Pulse: 65  Resp: 18  Temp: 98.7 F (37.1 C)  SpO2: 97%   Filed Weights   05/22/21 1105  Weight: 242 lb 12.8  oz (110.1 kg)    GENERAL:alert, no distress and comfortable SKIN: skin color, texture, turgor are normal, no rashes or significant lesions EYES: normal, Conjunctiva are pink and non-injected, sclera clear OROPHARYNX:no exudate, no erythema and lips, buccal mucosa, and tongue normal  NECK: supple, thyroid normal size, non-tender, without nodularity LYMPH:  no palpable lymphadenopathy in the cervical, axillary or inguinal LUNGS: clear to auscultation and percussion with normal breathing effort HEART: regular rate & rhythm and no murmurs and no lower  extremity edema ABDOMEN:abdomen soft, non-tender and normal bowel sounds.  Her abdominal incision is well-healed Musculoskeletal:no cyanosis of digits and no clubbing  NEURO: alert & oriented x 3 with fluent speech, no focal motor/sensory deficits  LABORATORY DATA:  I have reviewed the data as listed    Component Value Date/Time   NA 141 05/22/2021 1043   NA 143 12/20/2019 0829   K 3.9 05/22/2021 1043   CL 107 05/22/2021 1043   CO2 24 05/22/2021 1043   GLUCOSE 147 (H) 05/22/2021 1043   BUN 15 05/22/2021 1043   BUN 16 12/20/2019 0829   CREATININE 0.67 05/22/2021 1043   CALCIUM 9.1 05/22/2021 1043   PROT 7.2 05/22/2021 1043   PROT 6.4 02/04/2020 1036   ALBUMIN 3.5 05/22/2021 1043   ALBUMIN 4.2 02/04/2020 1036   AST 16 05/22/2021 1043   ALT 21 05/22/2021 1043   ALKPHOS 92 05/22/2021 1043   BILITOT 0.2 (L) 05/22/2021 1043   GFRNONAA >60 05/22/2021 1043   GFRAA 104 12/20/2019 0829    No results found for: SPEP, UPEP  Lab Results  Component Value Date   WBC 5.6 05/22/2021   NEUTROABS 4.9 05/22/2021   HGB 9.3 (L) 05/22/2021   HCT 30.1 (L) 05/22/2021   MCV 93.8 05/22/2021   PLT 206 05/22/2021      Chemistry      Component Value Date/Time   NA 141 05/22/2021 1043   NA 143 12/20/2019 0829   K 3.9 05/22/2021 1043   CL 107 05/22/2021 1043   CO2 24 05/22/2021 1043   BUN 15 05/22/2021 1043   BUN 16 12/20/2019 0829   CREATININE 0.67 05/22/2021 1043      Component Value Date/Time   CALCIUM 9.1 05/22/2021 1043   ALKPHOS 92 05/22/2021 1043   AST 16 05/22/2021 1043   ALT 21 05/22/2021 1043   BILITOT 0.2 (L) 05/22/2021 1043

## 2021-06-02 ENCOUNTER — Ambulatory Visit: Payer: Medicare Other | Admitting: Hematology and Oncology

## 2021-06-03 ENCOUNTER — Ambulatory Visit: Payer: Medicare Other | Attending: Hematology and Oncology

## 2021-06-03 ENCOUNTER — Other Ambulatory Visit: Payer: Self-pay

## 2021-06-03 DIAGNOSIS — C762 Malignant neoplasm of abdomen: Secondary | ICD-10-CM | POA: Diagnosis present

## 2021-06-03 DIAGNOSIS — C786 Secondary malignant neoplasm of retroperitoneum and peritoneum: Secondary | ICD-10-CM | POA: Insufficient documentation

## 2021-06-03 DIAGNOSIS — M6281 Muscle weakness (generalized): Secondary | ICD-10-CM | POA: Diagnosis not present

## 2021-06-03 DIAGNOSIS — R293 Abnormal posture: Secondary | ICD-10-CM | POA: Insufficient documentation

## 2021-06-03 NOTE — Therapy (Signed)
[facility] @ [place], [place], [address] Phone: [phone]   Fax:  [phone]  Physical Therapy Treatment  Patient Details  Name: Brittany Middleton MRN: [id number] Date of Birth: 08/19/1950 Referring Provider (PT): Dr. 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 Last Assessment & Plan:  Formatting of this note might be different from the original. - continue home meds   Family history of breast cancer 01/27/2021   Family history of lung cancer 01/27/2021   Gastrointestinal bleeding 07/25/2018   Gastrointestinal hemorrhage with melena 07/16/2018   Genetic testing 02/13/2021   No pathogenic variants detected in [employer] + MyRisk.  The report date is February 12, 2021.   The [facility] gene panel offered by [facility] includes sequencing and deletion/duplication testing of the following 48 genes: APC, ATM, AXIN2, BAP1,  BARD1, BMPR1A, BRCA1, BRCA2, BRIP1, CHD1, CDK4, CDKN2A(p16 and p14ARF), CHEK2, CTNNA1, EGFR, EPCAM, FH, FLCN, GREM1, HOXB13, MEN1, M   GERD (gastroesophageal reflux disease)    Hematuria 06/19/2020   Hepatic steatosis    per imaging 2016, 2019   Hiatal hernia    History of palpitations    Holter monitor, 2017: NSR, occasional PVC, PACs, no arrhythmia   HLD (hyperlipidemia) 02/27/2018   HTN (hypertension) 02/27/2018   Hypercholesteremia    Hypercholesterolemia 04/07/2017   Hyperglycemia 06/19/2020   Hypertension    Hypertensive disorder 04/07/2017   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   Hypokalemia 07/17/2018   Lumbar disc disease    MRI, 2017    Obesity, Class III, BMI 40-49.9 (morbid obesity) ([place]) 05/22/2018   Overweight 03/03/2018   Palpitations 08/19/2015   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[place])    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   Physical debility 01/10/2021   Pneumonia    Pure hypercholesterolemia, unspecified 04/07/2017   Pyelonephritis 03/30/2020   Recurrent UTI 05/28/2020   SBO (small bowel obstruction) ([place]) 12/28/2020   Sepsis secondary to UTI ([place]) 04/25/2020   Severe sepsis ([place]) 05/21/2020   Severe sepsis with acute organ dysfunction ([place]) 04/25/2020   Skin infection 02/05/2021   Skin irritation 02/05/2021   Sleep apnea    Urinary tract infection due to extended-spectrum beta lactamase (ESBL) producing Escherichia coli    Uterine cancer ([place]) 01/01/2021    Past Surgical History:  Procedure Laterality Date   BLADDER REPAIR     BREAST EXCISIONAL BIOPSY Left    CARPAL TUNNEL RELEASE Right    CATARACT EXTRACTION, BILATERAL  2014   COLONOSCOPY WITH PROPOFOL N/A 07/18/2018   Procedure: COLONOSCOPY WITH PROPOFOL;  Surgeon: Laurence Spates, MD;  Location: [place];  Service: Endoscopy;  Laterality: N/A;   ESOPHAGOGASTRODUODENOSCOPY (EGD) WITH PROPOFOL N/A 07/17/2018   Procedure: ESOPHAGOGASTRODUODENOSCOPY (EGD) WITH PROPOFOL;  Surgeon: Laurence Spates, MD;  Location: [place];  Service: Endoscopy;  Laterality: N/A;   LAPAROSCOPY N/A 12/30/2020   Procedure: LAPAROSCOPY DIAGNOSTIC; WITH  BIOPSY OF ABDOMINAL WALL NODULES AND BIOPSY OF PERICOLONIC EXUDATE;  Surgeon: Kinsinger, Arta Bruce, MD;  [facility];  Service: General;  Laterality: N/A;   PORTACATH PLACEMENT N/A 01/09/2021   Procedure: PORT INSERTION WITH [place] & FLUORO;  Surgeon: Kieth Brightly Arta Bruce, MD;  [facility];  Service: General;  Laterality: N/A;   REPAIR RECTOCELE     TOE SURGERY Left     There were no vitals filed for this visit.   Subjective Assessment - 06/03/21 0958     Subjective Overall I am feeling good, but today isn't 1 of my better days.  My energy level is low. I am 12 days out from chemo. My hemoglobin was 9 going into chemo. Yesterday I did 2 half mile walks and stopped 2-4 times to catch my breath. The back cramping has really improved and doesn't last as long.   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 She is presently undergoing chemotherapy with Carboplatin and Paclitaxel every 3 weeeks and has 3 more cycles.  Planning complete hysterectomy for 03/24/21-03/26/2021 and likely to resume chemo on 04/13/2021 if all goes well.    Patient Stated Goals recover from surgery.    Currently in Pain? Yes    Pain Score 5     Pain Location Leg    Pain Orientation Right;Left    Pain Descriptors / Indicators Burning    Pain Onset 1 to 4 weeks ago    Pain Frequency Intermittent    Aggravating Factors  nothing, just comes on after chemo    Pain Relieving Factors walking, movement    Effect of Pain on Daily Activities shortens how long she does things                               [facility] Adult PT Treatment/Exercise - 06/03/21 0001       Exercises   Other Exercises  marching 2 x 5 steps, heel raises x10, incline calf stretch 2 x 20 sec, seated ball squeeze x 10, seated alternate arm raises x 5 ea., tandem  stance  in bars x 2 bilaterally holding for 30  count,piriformis stretch x 2 B, ppt x 10, ppt with heel slides x 10 ea with ppt , manual calf stretch x 3 bilaterally     Knee/Hip Exercises: Aerobic   Nustep seat 7, arms 7, resistance 4 ,3 min, then rest and restart x 1:30                          PT Long Term Goals - 02/20/21 2016       PT LONG TERM GOAL #1   Title Pt will be independent with HEP for generalized stregth, flexibility and balance training    Time 4    Period Weeks  Status New    Target Date 03/20/21      PT LONG TERM GOAL #2   Title Pt will perform 12 sit to stands in 30 seconds for decreased risk of falls    Baseline 9    Time 4    Period Weeks    Status New    Target Date 03/20/21      PT LONG TERM GOAL #3   Title Pt will be able to maintain bilateral SLS for greater than 10 seconds each without excessive use of arms to maintain balance    Time 4    Period Weeks    Status New    Target Date 03/20/21      PT LONG TERM GOAL #4   Title Pt will report  improved heel toe gait pattern after walking short periods    Time 4    Period Weeks    Status New    Target Date 03/20/21                   Plan - 06/03/21 1058     Clinical Impression Statement Pts hemoglobin is low so activity was modified to do just a little at a time and rest. Pt advised not to overdo at this time.  She demonstrated improved balance with tandem stance today although left foot forward is still more wobbly.  Leg burning that she had prior to exercise was significantly improved although not all the way gone. prior complaints of back/side cramping nearly resolved now.    Personal Factors and Comorbidities Comorbidity 3+    Comorbidities s/p total hysterectomy 04/07/2021, aortic aneurysm, prior severe sepsis with acute organ failure, HBP    Examination-Activity Limitations Locomotion Level;Reach Overhead;Bend;Squat;Dressing;Stairs    Stability/Clinical Decision  Making Evolving/Moderate complexity    Rehab Potential Good    PT Frequency 2x / week    PT Duration 8 weeks    PT Treatment/Interventions ADLs/Self Care Home Management;Stair training;Gait training;Therapeutic activities;Therapeutic exercise;Balance training;Neuromuscular re-education;Manual techniques;Patient/family education    PT Next Visit Plan balance exs, hip abd, flexor strength, core strength, give rest as needed especially as hemoglobin is low presently.STM to left lateral back area of spasm70my not be necessary, nearly resolved now    PT Home Exercise Plan [id number] [email or web address]    Consulted and Agree with Plan of Care Patient             Patient will benefit from skilled therapeutic intervention in order to improve the following deficits and impairments:  Decreased activity tolerance, Decreased balance, Decreased knowledge of precautions, Decreased endurance, Decreased range of motion, Decreased strength, Postural dysfunction, Pain, Decreased mobility  Visit Diagnosis: Muscle weakness (generalized)  Abnormal posture  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   Diverticular disease  of colon 06/19/2020    Dyspnea 06/19/2020   Epigastric pain 06/19/2020   Hematuria 06/19/2020   Hyperglycemia 06/19/2020   Abdominal pain 05/28/2020   Recurrent UTI 05/28/2020   Asthma    Sleep apnea    Cervical disc disease    Hepatic steatosis    History of palpitations    Hypercholesteremia    Hypertension    Lumbar disc disease    Pyelonephritis 03/30/2020   Gastrointestinal bleeding 07/25/2018   Hiatal hernia    Anemia 07/17/2018   Acute blood loss anemia 07/17/2018   Hypokalemia 07/17/2018   Gastrointestinal hemorrhage with melena 07/16/2018   Obesity, Class III, BMI 40-49.9 (morbid obesity) ([place]) 05/22/2018   Overweight 03/03/2018   Coronary artery aneurysm 03/03/2018   Chest pain with moderate risk for cardiac etiology 02/27/2018   HTN (hypertension) 02/27/2018   HLD (hyperlipidemia) 02/27/2018   Hypercholesterolemia 04/07/2017   Hypertensive disorder 04/07/2017   Pure hypercholesterolemia, unspecified 04/07/2017   Essential (primary) hypertension 04/07/2017   Palpitations 08/19/2015    Brittany Middleton, PT 06/03/2021, 11:48 AM  [facility] @ [place], [place], [address] Phone: [phone]   Fax:  [phone]  Name: Brittany Middleton MRN: [id number] Date of Birth: 1951-03-13

## 2021-06-03 NOTE — Patient Instructions (Signed)
Access Code: [email or web address] URL: [email or web address] Date: 06/03/2021 Prepared by: Cheral Almas  Exercises Supine March - 1 x daily - 3 x weekly - 1 sets - 5-10 reps Hooklying Heel Walk - 1 x daily - 3 x weekly - 1 sets - 5-10 reps Standing Hip Abduction with Unilateral Counter Support - 1 x daily - 3 x weekly - 1 sets - 5-10 reps

## 2021-06-04 ENCOUNTER — Encounter: Payer: Self-pay | Admitting: Hematology and Oncology

## 2021-06-05 ENCOUNTER — Encounter: Payer: Medicare Other | Admitting: Rehabilitation

## 2021-06-09 ENCOUNTER — Other Ambulatory Visit: Payer: Self-pay

## 2021-06-09 ENCOUNTER — Ambulatory Visit: Payer: Medicare Other

## 2021-06-09 DIAGNOSIS — M6281 Muscle weakness (generalized): Secondary | ICD-10-CM | POA: Diagnosis not present

## 2021-06-09 DIAGNOSIS — R293 Abnormal posture: Secondary | ICD-10-CM

## 2021-06-09 DIAGNOSIS — C786 Secondary malignant neoplasm of retroperitoneum and peritoneum: Secondary | ICD-10-CM

## 2021-06-09 DIAGNOSIS — C762 Malignant neoplasm of abdomen: Secondary | ICD-10-CM

## 2021-06-09 NOTE — Therapy (Signed)
[facility] @ [place], [place], [address] Phone: [phone]   Fax:  [phone]  Physical Therapy Treatment  Patient Details  Name: Brittany Middleton MRN: [id number] Date of Birth: September 17, 1950 Referring Provider (PT): Dr. 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 Last Assessment & Plan:  Formatting of this note might be different from the original. - continue home meds   Family history of breast cancer 01/27/2021   Family history of lung cancer 01/27/2021   Gastrointestinal bleeding 07/25/2018   Gastrointestinal hemorrhage with melena 07/16/2018   Genetic testing 02/13/2021   No pathogenic variants detected in [employer] BRC[employer] + MyRisk.  The report date is February 12, 2021.   The [facility] gene panel offered by [facility] includes sequencing and deletion/duplication testing of the following 48 genes: APC, ATM, AXIN2, BAP1, BARD1, BMPR1A, BRCA1, BRCA2, BRIP1, CHD1, CDK4, CDKN2A(p16 and p14ARF), CHEK2, CTNNA1, EGFR, EPCAM, FH, FLCN, GREM1,  HOXB13, MEN1, M   GERD (gastroesophageal reflux disease)    Hematuria 06/19/2020   Hepatic steatosis    per imaging 2016, 2019   Hiatal hernia    History of palpitations    Holter monitor, 2017: NSR, occasional PVC, PACs, no arrhythmia   HLD (hyperlipidemia) 02/27/2018   HTN (hypertension) 02/27/2018   Hypercholesteremia    Hypercholesterolemia 04/07/2017   Hyperglycemia 06/19/2020   Hypertension    Hypertensive disorder 04/07/2017   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   Hypokalemia 07/17/2018   Lumbar disc disease    MRI, 2017    Obesity, Class III, BMI 40-49.9 (morbid obesity) ([place]) 05/22/2018   Overweight 03/03/2018   Palpitations 08/19/2015   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[place])    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   Physical debility 01/10/2021   Pneumonia    Pure hypercholesterolemia, unspecified 04/07/2017   Pyelonephritis 03/30/2020   Recurrent UTI 05/28/2020   SBO (small bowel obstruction) ([place]) 12/28/2020   Sepsis secondary to UTI ([place]) 04/25/2020   Severe sepsis ([place]) 05/21/2020   Severe sepsis with acute organ dysfunction ([place]) 04/25/2020   Skin infection 02/05/2021   Skin irritation 02/05/2021   Sleep apnea    Urinary tract infection due to extended-spectrum beta lactamase (ESBL) producing Escherichia coli    Uterine cancer ([place]) 01/01/2021    Past Surgical History:  Procedure Laterality Date   BLADDER REPAIR     BREAST EXCISIONAL BIOPSY Left  CARPAL TUNNEL RELEASE Right    CATARACT EXTRACTION, BILATERAL  2014   COLONOSCOPY WITH PROPOFOL N/A 07/18/2018   Procedure: COLONOSCOPY WITH PROPOFOL;  Surgeon: Laurence Spates, MD;  Location: [place];  Service: Endoscopy;  Laterality: N/A;   ESOPHAGOGASTRODUODENOSCOPY (EGD) WITH PROPOFOL N/A 07/17/2018   Procedure: ESOPHAGOGASTRODUODENOSCOPY (EGD) WITH PROPOFOL;  Surgeon: Laurence Spates, MD;  Location: [facility];  Service: Endoscopy;  Laterality: N/A;   LAPAROSCOPY N/A 12/30/2020   Procedure: LAPAROSCOPY DIAGNOSTIC;  WITH  BIOPSY OF ABDOMINAL WALL NODULES AND BIOPSY OF PERICOLONIC EXUDATE;  Surgeon: Kinsinger, Arta Bruce, MD;  [facility];  Service: General;  Laterality: N/A;   PORTACATH PLACEMENT N/A 01/09/2021   Procedure: PORT INSERTION WITH [place] & FLUORO;  Surgeon: Kieth Brightly Arta Bruce, MD;  [facility];  Service: General;  Laterality: N/A;   REPAIR RECTOCELE     TOE SURGERY Left     There were no vitals filed for this visit.   Subjective Assessment - 06/09/21 1001     Subjective Energy level is still a little low. My counts are still very low. Still get pretty SOB.  I have to take breaks with my activities at home. Had accupunture yesterday on the left side yesterday and I feel alot better.  The pain at my back has pretty well disappeared.    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 She is presently undergoing chemotherapy with Carboplatin and Paclitaxel every 3 weeeks and has 3 more cycles.  Planning complete hysterectomy for 03/24/21-03/26/2021 and likely to resume chemo on 04/13/2021 if all goes well. The back cramping is still there and doesn't last as long.    Patient Stated Goals recover from surgery.    Currently in Pain? No/denies    Pain Score 0-No pain                               [facility] Adult PT Treatment/Exercise - 06/09/21 0001       Exercises   Other Exercises  heel raises x 20, marching 2 x 5, gastroc stretch on 1/2 foam roll 3 x 20, LAQ 2# 2 x5 , scap retraction red theraband x 15, ppt x 10,ppt with heel slides 2 x 5 B, airex beam forward walking x 4 lengths, sideways walking x 4 lengths, Tandem walking in parallel bars on floor 4 lengths. piriformis stretch B x 2 30 sec, LTR x 3 ea      Lumbar Exercises: Stretches   Single Knee to Chest Stretch Limitations --                          PT Long Term Goals - 02/20/21 2016       PT LONG TERM GOAL #1   Title Pt will be independent  with HEP for generalized stregth, flexibility and balance training    Time 4    Period Weeks    Status New    Target Date 03/20/21      PT LONG TERM GOAL #2   Title Pt will perform 12 sit to stands in 30 seconds for decreased risk of falls    Baseline 9    Time 4    Period Weeks    Status New    Target Date 03/20/21      PT LONG TERM GOAL #3   Title Pt will be able to maintain bilateral SLS  for greater than 10 seconds each without excessive use of arms to maintain balance    Time 4    Period Weeks    Status New    Target Date 03/20/21      PT LONG TERM GOAL #4   Title Pt will report  improved heel toe gait pattern after walking short periods    Time 4    Period Weeks    Status New    Target Date 03/20/21                   Plan - 06/09/21 1046     Clinical Impression Statement Pt continues to get SOB relatively easily, however with rest breaks prn pt had good tolerance for activities in clinic done in standing, sitting and supine.  She had greater difficulty walking straight ahad on airex beam and was improved with sidestepping on airex beam. tandem walking on floor continues to be difficult. 1 episode of stomach cramping with right heel slides in supine but resolved quickly with sitting up.    Personal Factors and Comorbidities Comorbidity 3+    Comorbidities s/p total hysterectomy 04/07/2021, aortic aneurysm, prior severe sepsis with acute organ failure, HBP    Examination-Activity Limitations Locomotion Level;Reach Overhead;Bend;Squat;Dressing;Stairs    Stability/Clinical Decision Making Evolving/Moderate complexity    Rehab Potential Good    PT Duration 8 weeks    PT Treatment/Interventions ADLs/Self Care Home Management;Stair training;Gait training;Therapeutic activities;Therapeutic exercise;Balance training;Neuromuscular re-education;Manual techniques;Patient/family education    PT Next Visit Plan balance exs, hip abd, flexor strength, core strength, give rest as  needed especially as hemoglobin is low presently.STM to left lateral back area of spasm19my not be necessary, nearly resolved now    PT Home Exercise Plan [id number]    Consulted and Agree with Plan of Care Patient             Patient will benefit from skilled therapeutic intervention in order to improve the following deficits and impairments:  Decreased activity tolerance, Decreased balance, Decreased knowledge of precautions, Decreased endurance, Decreased range of motion, Decreased strength, Postural dysfunction, Pain, Decreased mobility  Visit Diagnosis: Muscle weakness (generalized)  Abnormal posture  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[place]  Malignant neoplasm of abdomen ([facility]     Problem List Patient Active Problem List   Diagnosis Date Noted   Anemia due to antineoplastic chemotherapy 05/22/2021   Postoperative pain 04/21/2021   Weight loss, abnormal 04/21/2021   Pancytopenia, acquired ([place] 03/06/2021   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   Coronary artery calcification 03/05/2021   Arthritis 03/04/2021   GERD (gastroesophageal reflux disease) 03/04/2021   Pneumonia 03/04/2021   Genetic testing 02/13/2021   Family history of breast cancer 01/27/2021   Family history of lung cancer 01/27/2021   Dysuria 01/26/2021   Physical deconditioning 01/10/2021   Diplopia 01/09/2021   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   Dyspnea 06/19/2020   Epigastric pain 06/19/2020   Hematuria 06/19/2020   Hyperglycemia 06/19/2020   Abdominal pain 05/28/2020   Recurrent UTI 05/28/2020   Asthma    Sleep apnea    Cervical disc disease    Hepatic steatosis    History of palpitations    Hypercholesteremia    Hypertension    Lumbar disc disease    Pyelonephritis 03/30/2020   Gastrointestinal bleeding 07/25/2018   Hiatal hernia    Anemia  07/17/2018  Acute blood loss anemia 07/17/2018   Hypokalemia 07/17/2018   Gastrointestinal hemorrhage with melena 07/16/2018   Obesity, Class III, BMI 40-49.9 (morbid obesity) ([place]) 05/22/2018   Overweight 03/03/2018   Coronary artery aneurysm 03/03/2018   Chest pain with moderate risk for cardiac etiology 02/27/2018   HTN (hypertension) 02/27/2018   HLD (hyperlipidemia) 02/27/2018   Hypercholesterolemia 04/07/2017   Hypertensive disorder 04/07/2017   Pure hypercholesterolemia, unspecified 04/07/2017   Essential (primary) hypertension 04/07/2017   Palpitations 08/19/2015    Claris Pong, PT 06/09/2021, 10:57 AM  [facility] @ [place], [place], [address] Phone: [phone]   Fax:  [phone]  Name: Mea Ozga MRN: [id number] Date of Birth: [birth year]

## 2021-06-19 ENCOUNTER — Encounter: Payer: Self-pay | Admitting: Hematology and Oncology

## 2021-06-19 ENCOUNTER — Other Ambulatory Visit: Payer: Self-pay

## 2021-06-19 ENCOUNTER — Inpatient Hospital Stay (HOSPITAL_BASED_OUTPATIENT_CLINIC_OR_DEPARTMENT_OTHER): Payer: Medicare Other | Admitting: Hematology and Oncology

## 2021-06-19 ENCOUNTER — Inpatient Hospital Stay: Payer: Medicare Other

## 2021-06-19 VITALS — BP 146/71 | HR 64 | Temp 98.2°F | Resp 18 | Ht 65.0 in | Wt 236.2 lb

## 2021-06-19 DIAGNOSIS — C5701 Malignant neoplasm of right fallopian tube: Secondary | ICD-10-CM

## 2021-06-19 DIAGNOSIS — D61818 Other pancytopenia: Secondary | ICD-10-CM | POA: Diagnosis not present

## 2021-06-19 DIAGNOSIS — C786 Secondary malignant neoplasm of retroperitoneum and peritoneum: Secondary | ICD-10-CM

## 2021-06-19 DIAGNOSIS — C762 Malignant neoplasm of abdomen: Secondary | ICD-10-CM

## 2021-06-19 LAB — CMP (CANCER CENTER ONLY)
ALT: 16 U/L (ref 0–44)
AST: 17 U/L (ref 15–41)
Albumin: 4.1 g/dL (ref 3.5–5.0)
Alkaline Phosphatase: 58 U/L (ref 38–126)
Anion gap: 8 (ref 5–15)
BUN: 18 mg/dL (ref 8–23)
CO2: 27 mmol/L (ref 22–32)
Calcium: 9.4 mg/dL (ref 8.9–10.3)
Chloride: 106 mmol/L (ref 98–111)
Creatinine: 0.55 mg/dL (ref 0.44–1.00)
GFR, Estimated: >60 mL/min (ref >60)
Glucose, Bld: 115 mg/dL — ABNORMAL HIGH (ref 70–99)
Potassium: 3.4 mmol/L — ABNORMAL LOW (ref 3.5–5.1)
Sodium: 141 mmol/L (ref 135–145)
Total Bilirubin: 0.4 mg/dL (ref 0.3–1.2)
Total Protein: 7 g/dL (ref 6.5–8.1)

## 2021-06-19 LAB — CBC WITH DIFFERENTIAL (CANCER CENTER ONLY)
Abs Immature Granulocytes: 0 10*3/uL (ref 0.00–0.07)
Basophils Absolute: 0 10*3/uL (ref 0.0–0.1)
Basophils Relative: 0 %
Eosinophils Absolute: 0 10*3/uL (ref 0.0–0.5)
Eosinophils Relative: 0 %
HCT: 30.2 % — ABNORMAL LOW (ref 36.0–46.0)
Hemoglobin: 9.4 g/dL — ABNORMAL LOW (ref 12.0–15.0)
Immature Granulocytes: 0 %
Lymphocytes Relative: 20 %
Lymphs Abs: 0.7 10*3/uL (ref 0.7–4.0)
MCH: 29 pg (ref 26.0–34.0)
MCHC: 31.1 g/dL (ref 30.0–36.0)
MCV: 93.2 fL (ref 80.0–100.0)
Monocytes Absolute: 0.5 10*3/uL (ref 0.1–1.0)
Monocytes Relative: 13 %
Neutro Abs: 2.5 10*3/uL (ref 1.7–7.7)
Neutrophils Relative %: 67 %
Platelet Count: 162 10*3/uL (ref 150–400)
RBC: 3.24 MIL/uL — ABNORMAL LOW (ref 3.87–5.11)
RDW: 16.5 % — ABNORMAL HIGH (ref 11.5–15.5)
WBC Count: 3.7 10*3/uL — ABNORMAL LOW (ref 4.0–10.5)
nRBC: 0 % (ref 0.0–0.2)

## 2021-06-19 MED ORDER — SODIUM CHLORIDE 0.9 % IV SOLN
150.0000 mg | Freq: Once | INTRAVENOUS | Status: AC
  Administered 2021-06-19: 10:00:00 150 mg via INTRAVENOUS
  Filled 2021-06-19: qty 150

## 2021-06-19 MED ORDER — FAMOTIDINE 20 MG IN NS 100 ML IVPB
20.0000 mg | Freq: Once | INTRAVENOUS | Status: AC
  Administered 2021-06-19: 10:00:00 20 mg via INTRAVENOUS
  Filled 2021-06-19: qty 100

## 2021-06-19 MED ORDER — SODIUM CHLORIDE 0.9 % IV SOLN
Freq: Once | INTRAVENOUS | Status: AC
Start: 2021-06-19 — End: 2021-06-19

## 2021-06-19 MED ORDER — DIPHENHYDRAMINE HCL 50 MG/ML IJ SOLN
12.5000 mg | Freq: Once | INTRAMUSCULAR | Status: AC
  Administered 2021-06-19: 09:00:00 12.5 mg via INTRAVENOUS
  Filled 2021-06-19: qty 1

## 2021-06-19 MED ORDER — PALONOSETRON HCL INJECTION 0.25 MG/5ML
0.2500 mg | Freq: Once | INTRAVENOUS | Status: AC
  Administered 2021-06-19: 09:00:00 0.25 mg via INTRAVENOUS
  Filled 2021-06-19: qty 5

## 2021-06-19 MED ORDER — SODIUM CHLORIDE 0.9% FLUSH
10.0000 mL | INTRAVENOUS | Status: DC | PRN
  Administered 2021-06-19: 15:00:00 10 mL

## 2021-06-19 MED ORDER — SODIUM CHLORIDE 0.9 % IV SOLN
750.0000 mg | Freq: Once | INTRAVENOUS | Status: AC
  Administered 2021-06-19: 14:00:00 750 mg via INTRAVENOUS
  Filled 2021-06-19: qty 75

## 2021-06-19 MED ORDER — SODIUM CHLORIDE 0.9 % IV SOLN
10.0000 mg | Freq: Once | INTRAVENOUS | Status: AC
  Administered 2021-06-19: 10:00:00 10 mg via INTRAVENOUS
  Filled 2021-06-19: qty 10

## 2021-06-19 MED ORDER — HEPARIN SOD (PORK) LOCK FLUSH 100 UNIT/ML IV SOLN
500.0000 [IU] | Freq: Once | INTRAVENOUS | Status: AC | PRN
  Administered 2021-06-19: 15:00:00 500 [IU]

## 2021-06-19 MED ORDER — SODIUM CHLORIDE 0.9 % IV SOLN
175.0000 mg/m2 | Freq: Once | INTRAVENOUS | Status: AC
  Administered 2021-06-19: 11:00:00 330 mg via INTRAVENOUS
  Filled 2021-06-19: qty 55

## 2021-06-19 NOTE — Assessment & Plan Note (Signed)
She will complete treatment today I reviewed plan of care I plan to order CT imaging next month for objective assessment of response to treatment I do not recommend extension of treatment either with bevacizumab or PARP inhibitor We discussed the role of metformin

## 2021-06-19 NOTE — Patient Instructions (Signed)
[facility]   Discharge Instructions: Thank you for choosing [facility] to provide your oncology and hematology care.   If you have a lab appointment with the Cancer Center, please go directly to the Cancer Center and check in at the registration area.   Wear comfortable clothing and clothing appropriate for easy access to any Portacath or PICC line.   We strive to give you quality time with your provider. You may need to reschedule your appointment if you arrive late (15 or more minutes).  Arriving late affects you and other patients whose appointments are after yours.  Also, if you miss three or more appointments without notifying the office, you may be dismissed from the clinic at the provider's discretion.      For prescription refill requests, have your pharmacy contact our office and allow 72 hours for refills to be completed.    Today you received the following chemotherapy and/or immunotherapy agents: paclitaxel and carboplatin.      To help prevent nausea and vomiting after your treatment, we encourage you to take your nausea medication as directed.  BELOW ARE SYMPTOMS THAT SHOULD BE REPORTED IMMEDIATELY: *FEVER GREATER THAN 100.4 F (38 C) OR HIGHER *CHILLS OR SWEATING *NAUSEA AND VOMITING THAT IS NOT CONTROLLED WITH YOUR NAUSEA MEDICATION *UNUSUAL SHORTNESS OF BREATH *UNUSUAL BRUISING OR BLEEDING *URINARY PROBLEMS (pain or burning when urinating, or frequent urination) *BOWEL PROBLEMS (unusual diarrhea, constipation, pain near the anus) TENDERNESS IN MOUTH AND THROAT WITH OR WITHOUT PRESENCE OF ULCERS (sore throat, sores in mouth, or a toothache) UNUSUAL RASH, SWELLING OR PAIN  UNUSUAL VAGINAL DISCHARGE OR ITCHING   Items with * indicate a potential emergency and should be followed up as soon as possible or go to the Emergency Department if any problems should occur.  Please show the CHEMOTHERAPY ALERT CARD or IMMUNOTHERAPY ALERT  CARD at check-in to the Emergency Department and triage nurse.  Should you have questions after your visit or need to cancel or reschedule your appointment, please contact [facility]  Dept: [phone]  and follow the prompts.  Office hours are 8:00 a.m. to 4:30 p.m. Monday - Friday. Please note that voicemails left after 4:00 p.m. may not be returned until the following business day.  We are closed weekends and major holidays. You have access to a nurse at all times for urgent questions. Please call the main number to the clinic Dept: [phone] and follow the prompts.   For any non-urgent questions, you may also contact your provider using MyChart. We now offer e-Visits for anyone 18 and older to request care online for non-urgent symptoms. For details visit [email or web address].   Also download the MyChart app! Go to the app store, search "MyChart", open the app, select , and log in with your MyChart username and password.  Due to Covid, a mask is required upon entering the hospital/clinic. If you do not have a mask, one will be given to you upon arrival. For doctor visits, patients may have 1 support person aged 18 or older with them. For treatment visits, patients cannot have anyone with them due to current Covid guidelines and our immunocompromised population.   

## 2021-06-19 NOTE — Assessment & Plan Note (Signed)
Her mild pancytopenia is multifactorial, related to recent chemotherapy  Observe only We will proceed with treatment without delay

## 2021-06-19 NOTE — Assessment & Plan Note (Signed)
We have extensive discussions about the importance of dietary modification and exercise I support the use of metformin and we discussed side effects to be expected

## 2021-06-19 NOTE — Progress Notes (Signed)
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[facility]) She will complete treatment today I reviewed plan of care I plan to order CT imaging next month for objective assessment of response to treatment I do not recommend extension of treatment either with bevacizumab or PARP inhibitor We discussed the role of metformin  Pancytopenia, acquired ([name]) Her mild pancytopenia is multifactorial, related to recent chemotherapy  Observe only We will proceed with treatment without delay  Obesity, Class III, BMI 40-49.9 (morbid obesity) ([place]) We have extensive discussions about the importance of dietary modification and exercise I support the use of metformin and we discussed side effects to be expected  Orders Placed This Encounter  Procedures   CT ABDOMEN PELVIS W CONTRAST    Standing Status:   Future    Standing Expiration Date:   06/19/2022    Order Specific Question:   If indicated for the ordered procedure, I authorize the administration of contrast media per Radiology protocol    Answer:   Yes    Order Specific Question:   Preferred imaging location?    Answer:   [facility]    Order Specific Question:   Radiology Contrast Protocol - do NOT remove file path    Answer:   [email or web address]    All questions were answered. The patient knows to call the clinic with any problems, questions or concerns. The total time spent in the appointment was 30 minutes encounter with patients including review of chart and various tests results, discussions about plan of care and coordination of care plan   [name], MD 06/19/2021 5:37  PM  INTERVAL HISTORY: Please see below for problem oriented charting. she returns for treatment follow-up to be seen prior to final dose of carboplatin and paclitaxel She is doing well She has minimum constipation No peripheral neuropathy She had intermittent pain at the incision site She had questions related to the role of metformin  REVIEW OF SYSTEMS:   Constitutional: Denies fevers, chills or abnormal weight loss Eyes: Denies blurriness of vision Ears, nose, mouth, throat, and face: Denies mucositis or sore throat Respiratory: Denies cough, dyspnea or wheezes Cardiovascular: Denies palpitation, chest discomfort or lower extremity swelling Skin: Denies abnormal skin rashes Lymphatics: Denies new lymphadenopathy or easy bruising Neurological:Denies numbness, tingling or new weaknesses Behavioral/Psych: Mood is stable, no new changes  All other systems were reviewed with the patient and are negative.  I have reviewed the past medical history, past surgical history, social history and family history with the patient and they are unchanged from previous note.  ALLERGIES:  is allergic to macrobid [nitrofurantoin], ciprofloxacin, codeine, and azithromycin.  MEDICATIONS:  Current Outpatient Medications  Medication Sig Dispense Refill   acetaminophen (TYLENOL) 500 MG tablet Take 1,000 mg by mouth every 6 (six) hours as needed for mild pain, moderate pain, fever or headache.     albuterol (PROVENTIL) (2.5 MG/3ML) 0.083% nebulizer solution Take 2.5 mg by nebulization every 6 (six) hours as needed for wheezing or shortness of breath.     alclomethasone (ACLOVATE) 0.05 % ointment Apply 1 application topically 2 (two) times daily.     BIOTIN PO Take 1 tablet by mouth daily.     Cholecalciferol (VITAMIN D-3)  25 MCG (1000 UT) CAPS Take 1,000 Units by mouth daily.     co-enzyme Q-10 30 MG capsule Take 30 mg by mouth daily.     dexamethasone (DECADRON) 4 MG tablet Take 2 tabs at the night  before and 2 tab the morning of chemotherapy, every 3 weeks, by mouth x 6 cycles 36 tablet 6   estradiol (ESTRACE) 0.1 MG/GM vaginal cream Place 1 Applicatorful vaginally 3 (three) times a week.     HYDROmorphone (DILAUDID) 2 MG tablet Take 1 tablet (2 mg total) by mouth every 6 (six) hours as needed for severe pain. 60 tablet 0   lidocaine-prilocaine (EMLA) cream Apply to affected area once 30 g 3   Melatonin 5 MG CHEW Chew 5 mg by mouth at bedtime.      Multiple Vitamins-Minerals (MULTIVITAMIN WITH MINERALS) tablet Take 1 tablet by mouth daily.     ondansetron (ZOFRAN) 8 MG tablet Take 1 tablet (8 mg total) by mouth every 8 (eight) hours as needed for refractory nausea / vomiting. 30 tablet 1   pantoprazole (PROTONIX) 40 MG tablet Take 1 tablet (40 mg total) by mouth daily. 30 tablet 1   polyethylene glycol (MIRALAX / GLYCOLAX) 17 g packet Take 8.5 g by mouth 2 (two) times daily.     rosuvastatin (CRESTOR) 20 MG tablet TAKE ONE TABLET BY MOUTH ONE TIME DAILY 90 tablet 0   No current facility-administered medications for this visit.   Facility-Administered Medications Ordered in Other Visits  Medication Dose Route Frequency Provider Last Rate Last Admin   heparin lock flush 100 unit/mL  500 Units Intracatheter Once [name], [name], MD       sodium chloride flush (NS) 0.9 % injection 10 mL  10 mL Intracatheter Once [name], [name], MD       sodium chloride flush (NS) 0.9 % injection 10 mL  10 mL Intracatheter PRN [name], Akemi Overholser, MD   10 mL at 06/19/21 1451    SUMMARY OF ONCOLOGIC HISTORY: Oncology History Overview Note  BRCA1/2 negative testing High grade serous, near complete pathological response to neoadjuvant chemo   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 - Diverticulosis in the sigmoid colon and in the descending colon. - No specimens collected. - Blood in stool without cause found on endoscopic exam   05/08/2020 Imaging   1. Bladder is decompressed though demonstrates significant perivesicular hazy stranding, mucosal hyperemia and edematous mural thickening. Findings are suggestive of cystitis. Correlate with urinalysis. No abnormal perinephric or periureteral stranding or other features to suggest an ascending tract infection at this time. 2. Colonic diverticulosis without evidence of acute diverticulitis. 3. Aortic Atherosclerosis (ICD10-I70.0).   12/28/2020 Imaging   1. 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 Differential for peritoneal carcinomatosis is primarily neoplastic and includes cancers of the ovary, appendix, colon, pancreas and stomach. PET-CT may be helpful for further characterization. Tissue sampling of the mesentery may be eventually required for diagnosis. 2. Mildly distended small bowel loops throughout the abdomen and pelvis, with fluid and associated air-fluid levels throughout the nondistended small bowel, suggesting a mild ileus versus  partial small bowel obstruction. Favor partial small bowel obstruction with transition zone in the RIGHT lower quadrant, likely related to aforementioned neoplastic peritoneal implants and/or adhesions. 3. Small free fluid within the RIGHT upper quadrant and LEFT upper quadrant. No abscess collection seen. No free intraperitoneal air seen. 4. Extensive colonic diverticulosis without evidence of acute diverticulitis. 5. Small chronic pleural effusions with associated atelectasis.   12/29/2020 Tumor Marker   Patient's tumor was tested for the following markers:  CA-125. Results of the tumor marker test revealed 95.1.   12/30/2020 Surgery   Preoperative diagnosis: abdominal nodules   Postoperative diagnosis: same   Procedure: diagnostic laparoscopy with abdominal wall biopsy   Surgeon: Gurney Maxin, M.D.    Indications for procedure: Quinley Nesler is a 70 y.o. year old female with symptoms of nausea, vomiting, and abdominal pain. Work up was concerning for cancer with abdominal wall studding.   Description of procedure: The patient was brought into the operative suite. Anesthesia was administered with General endotracheal anesthesia. WHO checklist was applied. The patient was then placed in supine position. The area was prepped and draped in the usual sterile fashion.   A small left subcostal incision was made. A 38m trocar was used to gain access to the peritoneal cavity by optical entry technique. Pneumoperitoneum was applied with a high flow and low pressure. The laparoscope was reinserted to confirm position.   On initial visualization of the abdomen, there were multiple nodules throughout the abdominal wall. There were multiple nodules over the small intestine and mesentery. There was some loops of small intestine that were matted together. There was a large white mass in the left upper quadrant. Multiple areas of peritoneum were removed with harmonic scalpel. The large mass was inspected. It appeared to be related to the colon and omentum. Grasper was used to remove some superficial tissue and was sent for culture.   The abdomen was desufflated. The incisions were closed with 4-0 monocryl subcuticular stitch.   12/30/2020 Pathology Results   FINAL MICROSCOPIC DIAGNOSIS:   A. ABDOMINAL WALL, NODULE, EXCISION:  -  Metastatic adenocarcinoma  -  See comment   B. PERI COLONIC EXUDATE, EXCISION:  -  Metastatic adenocarcinoma  -  See comment   COMMENT:   By immunohistochemistry, the neoplastic cells are positive for cytokeratin 7, PAX8  and WT1 but negative for cytokeratin 20, CDX2, p53 and TTF-1.  The morphology and immunophenotype are consistent with a gynecologic primary.     01/01/2021 Initial Diagnosis   [place]   01/08/2021 Cancer Staging   Staging form: Ovary, Fallopian Tube, and Primary Peritoneal Carcinoma, AJCC 8th Edition - Clinical stage from 01/08/2021: cT3, cN0, cM0 - Signed by GHeath Lark MD on 01/08/2021 Stage prefix: Initial diagnosis    01/12/2021 - 03/11/2021 Chemotherapy   Patient is on Treatment Plan : OVARIAN Carboplatin (AUC 6) / Paclitaxel (175) q21d x 6 cycles     01/17/2021 Imaging   MRI brain No evidence of intracranial metastatic disease or other acute abnormality   02/12/2021 Genetic Testing   No pathogenic variants detected in [facility].  The report date is February 12, 2021.   The [facility] panel offered by [employer] sequencing and deletion/duplication testing of the following 48 genes: APC, ATM, AXIN2, BAP1, BARD1, BMPR1A, BRCA1, BRCA2, BRIP1, CHD1, CDK4, CDKN2A(p16 and p14ARF), CHEK2, CTNNA1, EGFR, EPCAM, FH, FLCN, GREM1, HOXB13, MEN1, MET, MITF , MLH1, MSH2, MSH3, MSH6, MUTYH, NTHL1, PALB2, PMS2, POLD1, POLE,  PTEN, RAD51C, RAD51D, RET, SDHA, SDHB, SDHC, SDHD, SMAD4, STK11,TERT, TP53, TSC1, TSC2, and VHL.  Unless otherwise specified, all coding regions and flanking non-coding regions are analyzed for sequence variation. Analysis of flanking intronic regions typically do not extend more than 20 bp before and 10 bp after each exon, though the exact region may be adjusted based on the presence of either potentially significant variants or highly repetitive sequences. Coding regions and proximal promoter regions near the transcription start sites are analyzed for large deletions or duplications. Specific genes are tested only for sequence and/or CNVs within limited regions. Limited clinically relevant regions are included for EGFR  (sequencing and CNV analysis of exons 18-21),RET (sequencing and CNV analysis of exons 5, 8, 10, 11, and 13-16), and MITF (sequencing of position c.952). Only CNV analysis of the last two exons of EPCAM is performed. CNV analysis of GREM1 includes the upstream region overlapping the adjacent gene SCG5. MSH3 exon 1 contains a long polyalanine repeat that can interfere with variant calling; therefore, MSH3 analysis excludes c.121 to c.237. Only sequence analysis of the exons encompassing the exonuclease domains of these genes is performed (POLD1 c.841 to c.1686, POLE c.802 to c.1473). Limited promoter regions in selected genes undergo sequence analysis including TERT (c.-71 to c.-1), and APC Promoter 1B (c.-195 to c.-190 and c.-125 ([id number]).   HRD testing pending.    03/02/2021 Imaging   Outside imaging at [facility] improved peritoneal carcinomatosis. No evidence of new metastatic disease or acute abdominal pathology.    03/02/2021 Imaging   Outside CT imaging done at [facility] improved peritoneal carcinomatosis. No evidence of new metastatic disease or acute abdominal pathology   03/02/2021 - 03/03/2021 Hospital Admission   She was hospitalized briefly for evaluation of hematochezia at [facility].  She underwent CT imaging and colonoscopy and was subsequently discharged   03/03/2021 Procedure   Colonoscopy was performed at [facility]  Findings: Diverticula were found in the entire colon. Internal hemorrhoids were found during retroflexion.  The hemorrhoids were Grade I (internal hemorrhoids that do not prolapse). Impression: - Diverticulosis in the entire examined colon. Likely source of bleeding. - Internal hemorrhoids.   04/07/2021 Surgery   PROCEDURES:  Exam under anesthesia Diagnostic laparoscopy Exploratory laparotomy Lysis of adhesions (>30 minutes) Infragastric omentectomy Total abdominal hysterectomy and bilateral salpingo-oophorectomy Optimal (R0) interval tumor  debulking  SURGEON: Surgeon(s) and Role: Mellody Drown, MD - Primary * Salinaro, Whitney Muse, MD - Resident - Assisting * Riki Sheer, Owens Shark, MD - Fellow  INDICATION(S): 70 y.o. who presented with carcinomatosis and mildly elevated CA125 to 95 with biopsy showing mullerian adenocarcinoma, s/p 3 cycles of neoadjuvant chemotherapy with normalization of CA125 and decreased disease burden on imaging.  OPERATIVE FINDINGS:  On exam, no palpable masses. On laparoscopy, omentum thickened and retracted along the transverse colon, pelvic adhesive disease. On laparotomy, omentum thickened, retracted, and densely adherent to the underlying colon mesentery, but without definitive viable tumor. Adhesions of the omentum to the descending colon, rectosigmoid colon to the left adnexa and posterior uterus and cervix, loop of ileum to the right adnexa, and bladder to anterior uterus and cervix. Transverse colon with small diverticulum that was over-sewed. Appendix normal and retrocecal. No grossly visible tumor in the abdomen including smooth diaphragm and liver surface, normal stomach, small bowel, colon, and normal appearing uterus, bilateral fallopian tubes, and bilateral ovaries.  At the conclusion of the case, no gross residual disease remained (R0).    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   05/22/2021 -  Chemotherapy   Patient is on Treatment Plan : OVARIAN Carboplatin (AUC 6) / Paclitaxel (175) q21d x 6 cycles       PHYSICAL EXAMINATION: ECOG PERFORMANCE STATUS: 1 - Symptomatic but completely ambulatory  Vitals:   06/19/21 0813  BP: (!) 146/71  Pulse: 64  Resp: 18  Temp: 98.2 F (36.8 C)  SpO2: 96%   Filed Weights   06/19/21 0813   Weight: 236 lb 3.2 oz (107.1 kg)    GENERAL:alert, no distress and comfortable NEURO: alert & oriented x 3 with fluent speech, no focal motor/sensory deficits  LABORATORY DATA:  I have reviewed the data as listed    Component Value Date/Time   NA 141 06/19/2021 0740   NA 143 12/20/2019 0829   K 3.4 (L) 06/19/2021 0740   CL 106 06/19/2021 0740   CO2 27 06/19/2021 0740   GLUCOSE 115 (H) 06/19/2021 0740   BUN 18 06/19/2021 0740   BUN 16 12/20/2019 0829   CREATININE 0.55 06/19/2021 0740   CALCIUM 9.4 06/19/2021 0740   PROT 7.0 06/19/2021 0740   PROT 6.4 02/04/2020 1036   ALBUMIN 4.1 06/19/2021 0740   ALBUMIN 4.2 02/04/2020 1036   AST 17 06/19/2021 0740   ALT 16 06/19/2021 0740   ALKPHOS 58 06/19/2021 0740   BILITOT 0.4 06/19/2021 0740   GFRNONAA >60 06/19/2021 0740   GFRAA 104 12/20/2019 0829    No results found for: SPEP, UPEP  Lab Results  Component Value Date   WBC 3.7 (L) 06/19/2021   NEUTROABS 2.5 06/19/2021   HGB 9.4 (L) 06/19/2021   HCT 30.2 (L) 06/19/2021   MCV 93.2 06/19/2021   PLT 162 06/19/2021      Chemistry      Component Value Date/Time   NA 141 06/19/2021 0740   NA 143 12/20/2019 0829   K 3.4 (L) 06/19/2021 0740   CL 106 06/19/2021 0740   CO2 27 06/19/2021 0740   BUN 18 06/19/2021 0740   BUN 16 12/20/2019 0829   CREATININE 0.55 06/19/2021 0740      Component Value Date/Time   CALCIUM 9.4 06/19/2021 0740   ALKPHOS 58 06/19/2021 0740   AST 17 06/19/2021 0740   ALT 16 06/19/2021 0740   BILITOT 0.4 06/19/2021 0740

## 2021-06-20 LAB — CA 125: Cancer Antigen (CA) 125: 3 U/mL (ref 0.0–38.1)

## 2021-06-26 ENCOUNTER — Ambulatory Visit: Payer: Medicare Other

## 2021-06-30 ENCOUNTER — Ambulatory Visit: Payer: Medicare Other | Attending: Hematology and Oncology

## 2021-06-30 ENCOUNTER — Other Ambulatory Visit: Payer: Self-pay

## 2021-06-30 DIAGNOSIS — R293 Abnormal posture: Secondary | ICD-10-CM | POA: Diagnosis present

## 2021-06-30 DIAGNOSIS — C786 Secondary malignant neoplasm of retroperitoneum and peritoneum: Secondary | ICD-10-CM | POA: Diagnosis present

## 2021-06-30 DIAGNOSIS — M6281 Muscle weakness (generalized): Secondary | ICD-10-CM | POA: Insufficient documentation

## 2021-06-30 DIAGNOSIS — C762 Malignant neoplasm of abdomen: Secondary | ICD-10-CM | POA: Diagnosis present

## 2021-06-30 NOTE — Therapy (Signed)
[name] [place], [place], [address] Phone: [phone]   Fax:  [phone]  Physical Therapy Treatment  Patient Details  Name: Brittany Middleton MRN: [id number] Date of Birth: 05/15/1951 Referring Provider (PT): Dr. 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 Last Assessment & Plan:  Formatting of this note might be different from the original. - continue home meds   Family history of breast cancer 01/27/2021   Family history of lung cancer 01/27/2021   Gastrointestinal bleeding 07/25/2018   Gastrointestinal hemorrhage with melena 07/16/2018   Genetic testing 02/13/2021   No pathogenic variants detected in [employer] [facility] [facility] [employer].  The report date is February 12, 2021.   The [facility] gene panel offered by [facility] includes sequencing and deletion/duplication testing of the following 48 genes: APC, ATM, AXIN2, BAP1,  BARD1, BMPR1A, BRCA1, BRCA2, BRIP1, CHD1, CDK4, CDKN2A(p16 and p14ARF), CHEK2, CTNNA1, EGFR, EPCAM, FH, FLCN, GREM1, HOXB13, MEN1, M   GERD (gastroesophageal reflux disease)    Hematuria 06/19/2020   Hepatic steatosis    per imaging 2016, 2019   Hiatal hernia    History of palpitations    Holter monitor, 2017: NSR, occasional PVC, PACs, no arrhythmia   HLD (hyperlipidemia) 02/27/2018   HTN (hypertension) 02/27/2018   Hypercholesteremia    Hypercholesterolemia 04/07/2017   Hyperglycemia 06/19/2020   Hypertension    Hypertensive disorder 04/07/2017   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   Hypokalemia 07/17/2018   Lumbar disc disease    MRI, 2017    Obesity, Class III, BMI 40-49.9 (morbid obesity) ([place]) 05/22/2018   Overweight 03/03/2018   Palpitations 08/19/2015   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[place])    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   Physical debility 01/10/2021   Pneumonia    Pure hypercholesterolemia, unspecified 04/07/2017   Pyelonephritis 03/30/2020   Recurrent UTI 05/28/2020   SBO (small bowel obstruction) ([place]) 12/28/2020   Sepsis secondary to UTI ([place]) 04/25/2020   Severe sepsis ([place]) 05/21/2020   Severe sepsis with acute organ dysfunction ([place]) 04/25/2020   Skin infection 02/05/2021   Skin irritation 02/05/2021   Sleep apnea    Urinary tract infection due to extended-spectrum beta lactamase (ESBL) producing Escherichia coli    Uterine cancer ([place]) 01/01/2021    Past Surgical History:  Procedure Laterality Date   BLADDER REPAIR     BREAST EXCISIONAL BIOPSY Left    CARPAL TUNNEL RELEASE Right    CATARACT EXTRACTION, BILATERAL  2014   COLONOSCOPY WITH PROPOFOL N/A 07/18/2018   Procedure: COLONOSCOPY WITH PROPOFOL;  Surgeon: Laurence Spates, MD;  Location: [facility];  Service: Endoscopy;  Laterality: N/A;   ESOPHAGOGASTRODUODENOSCOPY (EGD) WITH PROPOFOL N/A 07/17/2018   Procedure: ESOPHAGOGASTRODUODENOSCOPY (EGD) WITH PROPOFOL;  Surgeon: Laurence Spates, MD;  Location: [facility];  Service: Endoscopy;  Laterality: N/A;   LAPAROSCOPY N/A 12/30/2020   Procedure: LAPAROSCOPY DIAGNOSTIC; WITH  BIOPSY OF ABDOMINAL WALL NODULES AND BIOPSY OF PERICOLONIC EXUDATE;  Surgeon: Kinsinger, Arta Bruce, MD;  Location: [facility];  Service: General;  Laterality: N/A;   PORTACATH PLACEMENT N/A 01/09/2021   Procedure: PORT INSERTION WITH [place] & FLUORO;  Surgeon: Kieth Brightly Arta Bruce, MD;  Location: [facility];  Service: General;  Laterality: N/A;   REPAIR RECTOCELE     TOE SURGERY Left     There were no vitals filed for this visit.   Subjective Assessment - 06/30/21 0909     Subjective I finished my last chemo on December 30 and I have a scan on Jan 31st with result on Feb 2.Energy levels seem to be improving.  I walked just shy of a half a mile and a short walk in the afternoon. I trimmed a few crepe myrtle bushes yesterday. I still get the SOB, but I am seeing a cardiologist today. BAck pain has been doing really well but the left side pain is still there right near where they did the biopsy. It started after that, and  sometimes its better than others. My endurance to vacuum half the house has improved.  I feel more confident in my balance but I know I still need to work on it. I want to have better tolerance for my endurance activities and to mentally be able to handle it better. I can walk longer than I can stand.  I can only stand 3-5 minutes and I fatigue. I would like to be able to go up and down stairs without excessive SOB.    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 She is presently undergoing chemotherapy with Carboplatin and Paclitaxel every 3 weeeks and has 3 more cycles.  Planning complete hysterectomy for 03/24/21-03/26/2021 and likely to resume chemo on 04/13/2021 if all goes well. The back cramping is still there and doesn't last as long.    Patient Stated Goals recover from surgery.    Currently in Pain? Yes    Pain  Score 3     Pain Location Abdomen    Pain Orientation Left    Pain Descriptors / Indicators Sharp    Pain Type Surgical pain    Pain Onset More than a month ago                [facility] PT Assessment - 06/30/21 0001       Transfers   Comments 30 sec sit to stand 12      Ambulation/Gait   Gait Comments TUG 9.16 good gait pattern, no LOB      High Level Balance   High Level Balance Comments able to maintain bilateral tandem stance for 25 sec, and Right SLS for 13 sec.  Left SLS 3-5 seconds for 2 repetitions.  PT Education - 06/30/21 1022     Education Details 4 D ankle strength with TBand, and DF stretch in standing or with strap    Person(s) Educated Patient    Methods Explanation;Handout    Comprehension Returned demonstration                 PT Long Term Goals - 06/30/21 0940       PT LONG TERM GOAL #1   Title Pt will be independent with HEP for generalized stregth, flexibility and balance training    Time 4    Period Weeks    Status Achieved    Target Date 06/30/21      PT LONG TERM GOAL #2   Title Pt will perform 12 sit to stands in 30 seconds for decreased risk of falls    Baseline 9 baseline, 12 today    Time 4    Period Weeks    Status Achieved    Target Date 06/30/21      PT LONG TERM GOAL #3   Title Pt will be able to maintain bilateral SLS for greater than 10 seconds each without excessive use of arms to maintain balance    Baseline able to maintain 13 seconds on right, but not on left (3-5 sec)    Time 4    Period Weeks    Status Partially Met    Target Date 06/30/21      PT LONG TERM GOAL #4   Title Pt will report  improved heel toe gait pattern after walking short periods    Time 4    Period Weeks    Status On-going    Target Date 08/25/21      PT LONG TERM GOAL #5   Title pt will be able to go up and down stairs with minimal fatigue/SOB    Time 8    Period Weeks     Status New    Target Date 08/25/21      Additional Long Term Goals   Additional Long Term Goals Yes      PT LONG TERM GOAL #6   Title Pt will be able to stand for 7-10 minutes without undue fatigue    Time 8    Period Weeks    Status New    Target Date 08/25/21                   Plan - 06/30/21 1026     Clinical Impression Statement Pt achieved her goal today for 30 second sit to stand of 12, and was able to maintain tandem stance bilaterally for 25 seconds.  She continues to have difficulty with SLS on the left and can only maintain for 3 seconds.  Her gait pattern is much improved now with the TUG with a normal BOS and not the wide BOS that she started with initially.  She was able to turn today during TUG  without LOB. She is limited with standing to 3-5 minutes before she must sit, and is able to go up 1 flight of stairs but is very short of breath.  She is being screened by cardiology today. We discussed spanx as possible support for left abdomen.  She might like to take part in aquatic therapy after she has her scans done. She will benefit from continued PT to address deficits and return to PLOF. She has been able to attend therapy up to now for minimal visits because of chemotherapy  which ended on Dec. 30, 2022.    Personal Factors and Comorbidities Comorbidity 3+    Comorbidities s/p total hysterectomy 04/07/2021, aortic aneurysm, prior severe sepsis with acute organ failure, HBP    Examination-Activity Limitations Locomotion Level;Reach Overhead;Bend;Squat;Dressing;Stairs    Stability/Clinical Decision Making Stable/Uncomplicated    Rehab Potential Good    PT Frequency 2x / week    PT Duration 8 weeks    PT Treatment/Interventions ADLs/Self Care Home Management;Stair training;Gait training;Therapeutic activities;Therapeutic exercise;Balance training;Neuromuscular re-education;Manual techniques;Patient/family education;Aquatic Therapy    PT Next Visit Plan cardiology appt?  balance exs, hip abd, flexor strength, core strength, give rest as needed especially as hemoglobin is low presently.Spanx to support Left abdomen?    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[place])  Malignant neoplasm of abdomen [facility])     Problem List Patient Active Problem List   Diagnosis Date Noted   Anemia due to antineoplastic chemotherapy 05/22/2021   Postoperative pain 04/21/2021   Weight loss, abnormal 04/21/2021   Pancytopenia, acquired ([place]) 03/06/2021   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   Coronary artery calcification 03/05/2021   Arthritis 03/04/2021   GERD (gastroesophageal reflux disease) 03/04/2021   Pneumonia 03/04/2021   Genetic testing 02/13/2021   Family history of breast cancer 01/27/2021   Family history of lung cancer 01/27/2021   Dysuria 01/26/2021   Physical deconditioning 01/10/2021   Diplopia 01/09/2021   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   Dyspnea 06/19/2020   Epigastric pain 06/19/2020   Hematuria 06/19/2020   Hyperglycemia 06/19/2020   Abdominal pain 05/28/2020   Recurrent UTI 05/28/2020   Asthma    Sleep apnea    Cervical disc disease    Hepatic steatosis    History of palpitations    Hypercholesteremia    Hypertension    Lumbar disc disease    Pyelonephritis 03/30/2020   Gastrointestinal bleeding 07/25/2018    Hiatal hernia    Anemia 07/17/2018   Acute blood loss anemia 07/17/2018   Hypokalemia 07/17/2018   Gastrointestinal hemorrhage with melena 07/16/2018   Obesity, Class III, BMI 40-49.9 (morbid obesity) ([place]) 05/22/2018   Overweight 03/03/2018   Coronary artery aneurysm 03/03/2018   Chest pain with moderate risk for cardiac etiology 02/27/2018   HTN (hypertension) 02/27/2018   HLD (hyperlipidemia) 02/27/2018   Hypercholesterolemia 04/07/2017   Hypertensive disorder 04/07/2017   Pure hypercholesterolemia, unspecified 04/07/2017   Essential (primary) hypertension 04/07/2017   Palpitations 08/19/2015    Claris Pong, PT 06/30/2021, 10:43 AM  [facility] @ [facility], [place], [address] Phone: [phone]   Fax:  [phone]  Name: Isella Slatten MRN: [id number] Date of Birth: 18-Jul-1950

## 2021-06-30 NOTE — Patient Instructions (Signed)
Access Code: [email or web address] URL: [email or web address] Date: 06/30/2021 Prepared by: Cheral Almas  Exercises Long Sitting Ankle Dorsiflexion with Anchored Resistance - 1 x daily - 3 x weekly - 2 sets - 10 reps Seated Ankle Inversion with Resistance and Legs Crossed - 1 x daily - 3 x weekly - 2 sets - 10 reps Long Sitting Ankle Eversion with Resistance - 1 x daily - 3 x weekly - 2 sets - 10 reps Long Sitting Ankle Plantar Flexion with Resistance - 1 x daily - 3 x weekly - 2 sets - 10 reps Standing Gastroc Stretch on Step - 1 x daily - 7 x weekly - 1 sets - 3 reps - 30 hold Seated Calf Stretch with Strap - 1 x daily - 7 x weekly - 1 sets - 3 reps - 30 hold

## 2021-07-03 ENCOUNTER — Inpatient Hospital Stay: Payer: Medicare Other | Attending: Hematology and Oncology

## 2021-07-03 ENCOUNTER — Other Ambulatory Visit: Payer: Self-pay

## 2021-07-03 ENCOUNTER — Other Ambulatory Visit: Payer: Self-pay | Admitting: Hematology and Oncology

## 2021-07-03 ENCOUNTER — Encounter: Payer: Self-pay | Admitting: Hematology and Oncology

## 2021-07-03 ENCOUNTER — Telehealth: Payer: Self-pay

## 2021-07-03 DIAGNOSIS — R112 Nausea with vomiting, unspecified: Secondary | ICD-10-CM | POA: Insufficient documentation

## 2021-07-03 DIAGNOSIS — C762 Malignant neoplasm of abdomen: Secondary | ICD-10-CM

## 2021-07-03 DIAGNOSIS — C5701 Malignant neoplasm of right fallopian tube: Secondary | ICD-10-CM | POA: Diagnosis present

## 2021-07-03 DIAGNOSIS — K5730 Diverticulosis of large intestine without perforation or abscess without bleeding: Secondary | ICD-10-CM | POA: Diagnosis not present

## 2021-07-03 DIAGNOSIS — Z79899 Other long term (current) drug therapy: Secondary | ICD-10-CM | POA: Insufficient documentation

## 2021-07-03 DIAGNOSIS — Z9221 Personal history of antineoplastic chemotherapy: Secondary | ICD-10-CM | POA: Diagnosis not present

## 2021-07-03 DIAGNOSIS — J90 Pleural effusion, not elsewhere classified: Secondary | ICD-10-CM | POA: Insufficient documentation

## 2021-07-03 DIAGNOSIS — C786 Secondary malignant neoplasm of retroperitoneum and peritoneum: Secondary | ICD-10-CM | POA: Diagnosis not present

## 2021-07-03 LAB — CBC WITH DIFFERENTIAL (CANCER CENTER ONLY)
Abs Immature Granulocytes: 0.02 10*3/uL (ref 0.00–0.07)
Basophils Absolute: 0 10*3/uL (ref 0.0–0.1)
Basophils Relative: 1 %
Eosinophils Absolute: 0 10*3/uL (ref 0.0–0.5)
Eosinophils Relative: 2 %
HCT: 32.5 % — ABNORMAL LOW (ref 36.0–46.0)
Hemoglobin: 10 g/dL — ABNORMAL LOW (ref 12.0–15.0)
Immature Granulocytes: 1 %
Lymphocytes Relative: 48 %
Lymphs Abs: 1 10*3/uL (ref 0.7–4.0)
MCH: 28.6 pg (ref 26.0–34.0)
MCHC: 30.8 g/dL (ref 30.0–36.0)
MCV: 92.9 fL (ref 80.0–100.0)
Monocytes Absolute: 0.4 10*3/uL (ref 0.1–1.0)
Monocytes Relative: 19 %
Neutro Abs: 0.6 10*3/uL — ABNORMAL LOW (ref 1.7–7.7)
Neutrophils Relative %: 29 %
Platelet Count: 175 10*3/uL (ref 150–400)
RBC: 3.5 MIL/uL — ABNORMAL LOW (ref 3.87–5.11)
RDW: 17.2 % — ABNORMAL HIGH (ref 11.5–15.5)
WBC Count: 2 10*3/uL — ABNORMAL LOW (ref 4.0–10.5)
nRBC: 0 % (ref 0.0–0.2)

## 2021-07-03 LAB — URINALYSIS, COMPLETE (UACMP) WITH MICROSCOPIC
Bacteria, UA: NONE SEEN
Bilirubin Urine: NEGATIVE
Glucose, UA: >=500 mg/dL — AB
Hgb urine dipstick: NEGATIVE
Ketones, ur: 80 mg/dL — AB
Leukocytes,Ua: NEGATIVE
Nitrite: NEGATIVE
Protein, ur: NEGATIVE mg/dL
Specific Gravity, Urine: 1.03 (ref 1.005–1.030)
pH: 5 (ref 5.0–8.0)

## 2021-07-03 LAB — CMP (CANCER CENTER ONLY)
ALT: 14 U/L (ref 0–44)
AST: 17 U/L (ref 15–41)
Albumin: 4.1 g/dL (ref 3.5–5.0)
Alkaline Phosphatase: 64 U/L (ref 38–126)
Anion gap: 8 (ref 5–15)
BUN: 12 mg/dL (ref 8–23)
CO2: 29 mmol/L (ref 22–32)
Calcium: 9.3 mg/dL (ref 8.9–10.3)
Chloride: 105 mmol/L (ref 98–111)
Creatinine: 0.61 mg/dL (ref 0.44–1.00)
GFR, Estimated: >60 mL/min (ref >60)
Glucose, Bld: 96 mg/dL (ref 70–99)
Potassium: 3.8 mmol/L (ref 3.5–5.1)
Sodium: 142 mmol/L (ref 135–145)
Total Bilirubin: 0.4 mg/dL (ref 0.3–1.2)
Total Protein: 7.4 g/dL (ref 6.5–8.1)

## 2021-07-03 NOTE — Telephone Encounter (Signed)
Called regarding mychart message sent today. Reminded her to call the office and do not send mychart messages for urgent symptoms/ requests. She verbalized understanding. Scheduled 4 pm lab today with UA. Appt with Dr. [name] 1/16 at 1120. Instructed drink lots of oral fluids and call the after hours # over the weekend if needed. She verbalized understanding.

## 2021-07-04 LAB — CA 125: Cancer Antigen (CA) 125: 3.3 U/mL (ref 0.0–38.1)

## 2021-07-05 LAB — URINE CULTURE

## 2021-07-06 ENCOUNTER — Encounter: Payer: Self-pay | Admitting: Hematology and Oncology

## 2021-07-06 ENCOUNTER — Inpatient Hospital Stay (HOSPITAL_BASED_OUTPATIENT_CLINIC_OR_DEPARTMENT_OTHER): Payer: Medicare Other | Admitting: Hematology and Oncology

## 2021-07-06 ENCOUNTER — Other Ambulatory Visit: Payer: Self-pay

## 2021-07-06 DIAGNOSIS — C5701 Malignant neoplasm of right fallopian tube: Secondary | ICD-10-CM | POA: Diagnosis not present

## 2021-07-06 NOTE — Assessment & Plan Note (Signed)
Overall, she continues to improve Urinalysis and urine culture thankfully did not show any evidence of urinary tract infection She will continue supportive care and physical therapy We discussed the risk and benefits of metformin and she will continue Her tumor marker is normal I am confident we will have normal CT imaging by the end of the month

## 2021-07-06 NOTE — Progress Notes (Signed)
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   All questions were answered. The patient knows to call the clinic with any problems, questions or concerns. The total time spent in the appointment was 20 minutes encounter with patients including review of chart and various tests results, discussions about plan of care and coordination of care plan   [name], MD 07/06/2021 1:48 PM  INTERVAL HISTORY: Please see below for problem oriented charting. she returns for urgent evaluation due to recent sensation of urinary frequency and dysuria She denies fever or chills She thinks the cause of this is related to the timing of her metformin She is feeling better She has no pain and is doing physical therapy  REVIEW OF SYSTEMS:   Constitutional: Denies fevers, chills or abnormal weight loss Eyes: Denies blurriness of vision Ears, nose, mouth, throat, and face: Denies mucositis or sore throat Respiratory: Denies cough, dyspnea or wheezes Cardiovascular: Denies palpitation, chest discomfort or lower extremity swelling Gastrointestinal:  Denies nausea, heartburn or change in bowel habits Skin: Denies abnormal skin rashes Lymphatics: Denies new lymphadenopathy or  easy bruising Neurological:Denies numbness, tingling or new weaknesses Behavioral/Psych: Mood is stable, no new changes  All other systems were reviewed with the patient and are negative.  I have reviewed the past medical history, past surgical history, social history and family history with the patient and they are unchanged from previous note.  ALLERGIES:  is allergic to macrobid [nitrofurantoin], ciprofloxacin, codeine, and azithromycin.  MEDICATIONS:  Current Outpatient Medications  Medication Sig Dispense Refill   acetaminophen (TYLENOL) 500 MG tablet Take 1,000 mg by mouth every 6 (six) hours as needed for mild pain, moderate pain, fever or headache.     albuterol (PROVENTIL) (2.5 MG/3ML) 0.083% nebulizer solution Take 2.5 mg by nebulization every 6 (six) hours as needed for wheezing or shortness of breath.     alclomethasone (ACLOVATE) 0.05 % ointment Apply 1 application topically 2 (two) times daily.     BIOTIN PO Take 1 tablet by mouth daily.     Cholecalciferol (VITAMIN D-3) 25 MCG (1000 UT) CAPS Take 1,000 Units by mouth daily.     co-enzyme Q-10 30 MG capsule Take 30 mg by mouth daily.     estradiol (ESTRACE) 0.1 MG/GM vaginal cream Place 1 Applicatorful vaginally 3 (three) times a week.     lidocaine-prilocaine (EMLA) cream Apply to affected area once 30 g 3   Melatonin 5 MG CHEW Chew 5 mg by mouth at bedtime.      Multiple Vitamins-Minerals (MULTIVITAMIN WITH MINERALS) tablet Take 1 tablet by mouth daily.     ondansetron (ZOFRAN) 8 MG tablet Take 1 tablet (8 mg total) by mouth every 8 (eight) hours as needed for refractory nausea / vomiting. 30 tablet 1   pantoprazole (PROTONIX) 40  MG tablet Take 1 tablet (40 mg total) by mouth daily. 30 tablet 1   polyethylene glycol (MIRALAX / GLYCOLAX) 17 g packet Take 8.5 g by mouth 2 (two) times daily.     rosuvastatin (CRESTOR) 20 MG tablet TAKE ONE TABLET BY MOUTH ONE TIME DAILY 90 tablet 0   No current facility-administered  medications for this visit.   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 - Diverticulosis in the sigmoid colon and in the descending colon. - No specimens collected. - Blood in stool without cause found on endoscopic exam   05/08/2020 Imaging   1. Bladder is decompressed though demonstrates significant perivesicular hazy stranding, mucosal hyperemia and edematous mural thickening. Findings are suggestive of cystitis. Correlate with urinalysis. No abnormal perinephric or periureteral stranding or other features to suggest an ascending tract infection at this time. 2. Colonic diverticulosis without evidence of acute diverticulitis. 3. Aortic Atherosclerosis (ICD10-I70.0).   12/28/2020 Imaging   1. 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 Differential for peritoneal carcinomatosis is primarily neoplastic and includes cancers of the ovary, appendix, colon, pancreas and stomach. PET-CT may be helpful for further characterization. Tissue sampling of the mesentery  may be eventually required for diagnosis. 2. Mildly distended small bowel loops throughout the abdomen and pelvis, with fluid and associated air-fluid levels throughout the nondistended small bowel, suggesting a mild ileus versus partial small bowel obstruction. Favor partial small bowel obstruction with transition zone in the RIGHT lower quadrant, likely related to aforementioned neoplastic peritoneal implants and/or adhesions. 3. Small free fluid within the RIGHT upper quadrant and LEFT upper quadrant. No abscess collection seen. No free intraperitoneal air seen. 4. Extensive colonic diverticulosis without evidence of acute diverticulitis. 5. Small chronic pleural effusions with associated atelectasis.   12/29/2020 Tumor Marker   Patient's tumor was tested for the following markers: CA-125. Results of the tumor marker test revealed 95.1.   12/30/2020 Surgery   Preoperative diagnosis: abdominal nodules   Postoperative diagnosis: same   Procedure: diagnostic laparoscopy with abdominal wall biopsy   Surgeon: Gurney Maxin, M.D.    Indications for procedure: Brittany Middleton is a 71 y.o. year old female with symptoms of nausea, vomiting, and abdominal pain. Work up was concerning for cancer with abdominal wall studding.   Description of procedure: The patient was brought into the operative suite. Anesthesia was administered with General endotracheal anesthesia. WHO checklist was applied. The patient was then placed in supine position. The area was prepped and draped in the usual sterile fashion.   A small left subcostal incision was made. A 55m trocar was used to  gain access to the peritoneal cavity by optical entry technique. Pneumoperitoneum was applied with a high flow and low pressure. The laparoscope was reinserted to confirm position.   On initial visualization of the abdomen, there were multiple nodules throughout the abdominal wall. There were multiple nodules over the small  intestine and mesentery. There was some loops of small intestine that were matted together. There was a large white mass in the left upper quadrant. Multiple areas of peritoneum were removed with harmonic scalpel. The large mass was inspected. It appeared to be related to the colon and omentum. Grasper was used to remove some superficial tissue and was sent for culture.   The abdomen was desufflated. The incisions were closed with 4-0 monocryl subcuticular stitch.   12/30/2020 Pathology Results   FINAL MICROSCOPIC DIAGNOSIS:   A. ABDOMINAL WALL, NODULE, EXCISION:  -  Metastatic adenocarcinoma  -  See comment   B. PERI COLONIC EXUDATE, EXCISION:  -  Metastatic adenocarcinoma  -  See comment   COMMENT:   By immunohistochemistry, the neoplastic cells are positive for cytokeratin 7, PAX8 and WT1 but negative for cytokeratin 20, CDX2, p53 and TTF-1.  The morphology and immunophenotype are consistent with a gynecologic primary.     01/01/2021 Initial Diagnosis   Peritoneal carcinomatosis ([place])   01/08/2021 Cancer Staging   Staging form: Ovary, Fallopian Tube, and Primary Peritoneal Carcinoma, AJCC 8th Edition - Clinical stage from 01/08/2021: cT3, cN0, cM0 - Signed by [name], MD on 01/08/2021 Stage prefix: Initial diagnosis    01/12/2021 - 03/11/2021 Chemotherapy   Patient is on Treatment Plan : OVARIAN Carboplatin (AUC 6) / Paclitaxel (175) q21d x 6 cycles     01/17/2021 Imaging   MRI brain No evidence of intracranial metastatic disease or other acute abnormality   02/12/2021 Genetic Testing   No pathogenic variants detected in [facility].  The report date is February 12, 2021.   [facility] gene panel offered by [employer] includes sequencing and deletion/duplication testing of the following 48 genes: APC, ATM, AXIN2, BAP1, BARD1, BMPR1A, BRCA1, BRCA2, BRIP1, CHD1, CDK4, CDKN2A(p16 and p14ARF), CHEK2, CTNNA1, EGFR, EPCAM, FH, FLCN, GREM1, HOXB13, MEN1,  MET, MITF , MLH1, MSH2, MSH3, MSH6, MUTYH, NTHL1, PALB2, PMS2, POLD1, POLE, PTEN, RAD51C, RAD51D, RET, SDHA, SDHB, SDHC, SDHD, SMAD4, STK11,TERT, TP53, TSC1, TSC2, and VHL.  Unless otherwise specified, all coding regions and flanking non-coding regions are analyzed for sequence variation. Analysis of flanking intronic regions typically do not extend more than 20 bp before and 10 bp after each exon, though the exact region may be adjusted based on the presence of either potentially significant variants or highly repetitive sequences. Coding regions and proximal promoter regions near the transcription start sites are analyzed for large deletions or duplications. Specific genes are tested only for sequence and/or CNVs within limited regions. Limited clinically relevant regions are included for EGFR (sequencing and CNV analysis of exons 18-21),RET (sequencing and CNV analysis of exons 5, 8, 10, 11, and 13-16), and MITF (sequencing of position c.952). Only CNV analysis of the last two exons of EPCAM is performed. CNV analysis of GREM1 includes the upstream region overlapping the adjacent gene SCG5. MSH3 exon 1 contains a long polyalanine repeat that can interfere with variant calling; therefore, MSH3 analysis excludes c.121 to c.237. Only sequence analysis of the exons encompassing the exonuclease domains of these genes is performed (POLD1 c.841 to c.1686, POLE c.802 to c.1473). Limited promoter regions in selected genes undergo sequence  analysis including TERT (c.-71 to c.-1), and APC Promoter 1B (c.-195 to c.-190 and c.-125 ([id number]).   HRD testing pending.    03/02/2021 Imaging   Outside imaging at [facility] improved peritoneal carcinomatosis. No evidence of new metastatic disease or acute abdominal pathology.    03/02/2021 Imaging   Outside CT imaging done at [facility] improved peritoneal carcinomatosis. No evidence of new metastatic disease or acute abdominal pathology   03/02/2021 -  03/03/2021 Hospital Admission   She was hospitalized briefly for evaluation of hematochezia at [facility].  She underwent CT imaging and colonoscopy and was subsequently discharged   03/03/2021 Procedure   Colonoscopy was performed at [facility]  Findings: Diverticula were found in the entire colon. Internal hemorrhoids were found during retroflexion.  The hemorrhoids were Grade I (internal hemorrhoids that do not prolapse). Impression: - Diverticulosis in the entire examined colon. Likely source of bleeding. - Internal hemorrhoids.   04/07/2021 Surgery   PROCEDURES:  Exam under anesthesia Diagnostic laparoscopy Exploratory laparotomy Lysis of adhesions (>30 minutes) Infragastric omentectomy Total abdominal hysterectomy and bilateral salpingo-oophorectomy Optimal (R0) interval tumor debulking  SURGEON: Surgeon(s) and Role: Mellody Drown, MD - Primary * Salinaro, Whitney Muse, MD - Resident - Assisting * Riki Sheer, Owens Shark, MD - Fellow  INDICATION(S): 71 y.o. who presented with carcinomatosis and mildly elevated CA125 to 95 with biopsy showing mullerian adenocarcinoma, s/p 3 cycles of neoadjuvant chemotherapy with normalization of CA125 and decreased disease burden on imaging.  OPERATIVE FINDINGS:  On exam, no palpable masses. On laparoscopy, omentum thickened and retracted along the transverse colon, pelvic adhesive disease. On laparotomy, omentum thickened, retracted, and densely adherent to the underlying colon mesentery, but without definitive viable tumor. Adhesions of the omentum to the descending colon, rectosigmoid colon to the left adnexa and posterior uterus and cervix, loop of ileum to the right adnexa, and bladder to anterior uterus and cervix. Transverse colon with small diverticulum that was over-sewed. Appendix normal and retrocecal. No grossly visible tumor in the abdomen including smooth diaphragm and liver surface, normal stomach, small bowel, colon, and normal appearing  uterus, bilateral fallopian tubes, and bilateral ovaries.  At the conclusion of the case, no gross residual disease remained (R0).    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   05/22/2021 -  Chemotherapy   Patient is on Treatment Plan : OVARIAN Carboplatin (AUC 6) / Paclitaxel (175) q21d x 6 cycles     06/19/2021 Tumor Marker   Patient's tumor was tested for the following markers: CA-125. Results of the tumor marker test revealed 3.     PHYSICAL EXAMINATION: ECOG PERFORMANCE STATUS: 0 - Asymptomatic  Vitals:   07/06/21 1123  BP: 138/62  Pulse: 62  Resp: 18  Temp: 98.7 F (37.1 C)  SpO2: 95%   Filed Weights   07/06/21 1123  Weight: 233 lb (105.7 kg)    GENERAL:alert, no distress and comfortable SKIN: skin color, texture, turgor are normal, no rashes or significant lesions EYES: normal, Conjunctiva are pink and non-injected, sclera clear OROPHARYNX:no exudate, no erythema and lips, buccal mucosa, and tongue normal  NECK: supple, thyroid normal size, non-tender, without nodularity LYMPH:  no palpable lymphadenopathy in the cervical, axillary or inguinal LUNGS: clear to auscultation and percussion with normal breathing effort HEART: regular rate & rhythm  and no murmurs and no lower extremity edema ABDOMEN:abdomen soft, non-tender and normal bowel sounds Musculoskeletal:no cyanosis of digits and no clubbing  NEURO: alert & oriented x 3 with fluent speech, no focal motor/sensory deficits  LABORATORY DATA:  I have reviewed the data as listed    Component Value Date/Time   NA 142 07/03/2021 1639   NA 143 12/20/2019 0829   K 3.8 07/03/2021 1639   CL 105 07/03/2021 1639   CO2 29 07/03/2021 1639   GLUCOSE 96 07/03/2021 1639   BUN  12 07/03/2021 1639   BUN 16 12/20/2019 0829   CREATININE 0.61 07/03/2021 1639   CALCIUM 9.3 07/03/2021 1639   PROT 7.4 07/03/2021 1639   PROT 6.4 02/04/2020 1036   ALBUMIN 4.1 07/03/2021 1639   ALBUMIN 4.2 02/04/2020 1036   AST 17 07/03/2021 1639   ALT 14 07/03/2021 1639   ALKPHOS 64 07/03/2021 1639   BILITOT 0.4 07/03/2021 1639   GFRNONAA >60 07/03/2021 1639   GFRAA 104 12/20/2019 0829    No results found for: SPEP, UPEP  Lab Results  Component Value Date   WBC 2.0 (L) 07/03/2021   NEUTROABS 0.6 (L) 07/03/2021   HGB 10.0 (L) 07/03/2021   HCT 32.5 (L) 07/03/2021   MCV 92.9 07/03/2021   PLT 175 07/03/2021      Chemistry      Component Value Date/Time   NA 142 07/03/2021 1639   NA 143 12/20/2019 0829   K 3.8 07/03/2021 1639   CL 105 07/03/2021 1639   CO2 29 07/03/2021 1639   BUN 12 07/03/2021 1639   BUN 16 12/20/2019 0829   CREATININE 0.61 07/03/2021 1639      Component Value Date/Time   CALCIUM 9.3 07/03/2021 1639   ALKPHOS 64 07/03/2021 1639   AST 17 07/03/2021 1639   ALT 14 07/03/2021 1639   BILITOT 0.4 07/03/2021 1639

## 2021-07-08 ENCOUNTER — Other Ambulatory Visit: Payer: Self-pay

## 2021-07-08 ENCOUNTER — Ambulatory Visit: Payer: Medicare Other

## 2021-07-08 DIAGNOSIS — C762 Malignant neoplasm of abdomen: Secondary | ICD-10-CM

## 2021-07-08 DIAGNOSIS — M6281 Muscle weakness (generalized): Secondary | ICD-10-CM | POA: Diagnosis not present

## 2021-07-08 DIAGNOSIS — C786 Secondary malignant neoplasm of retroperitoneum and peritoneum: Secondary | ICD-10-CM

## 2021-07-08 DIAGNOSIS — R293 Abnormal posture: Secondary | ICD-10-CM

## 2021-07-08 NOTE — Therapy (Signed)
[place] @ [place], [place], [address] Phone: [phone]   Fax:  [phone]  Physical Therapy Treatment  Patient Details  Name: Brittany Middleton MRN: [id number] Date of Birth: 1951-02-25 Referring Provider (PT): Dr. 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 Last Assessment & Plan:  Formatting of this note might be different from the original. - continue home meds   Family history of breast cancer 01/27/2021   Family history of lung cancer 01/27/2021   Gastrointestinal bleeding 07/25/2018   Gastrointestinal hemorrhage with melena 07/16/2018   Genetic testing 02/13/2021   No pathogenic variants detected in [employer] MyRisk.  The report date is February 12, 2021.   The [facility] gene panel offered by [facility] includes sequencing and deletion/duplication testing of the following 48 genes: APC, ATM, AXIN2, BAP1,  BARD1, BMPR1A, BRCA1, BRCA2, BRIP1, CHD1, CDK4, CDKN2A(p16 and p14ARF), CHEK2, CTNNA1, EGFR, EPCAM, FH, FLCN, GREM1, HOXB13, MEN1, M   GERD (gastroesophageal reflux disease)    Hematuria 06/19/2020   Hepatic steatosis    per imaging 2016, 2019   Hiatal hernia    History of palpitations    Holter monitor, 2017: NSR, occasional PVC, PACs, no arrhythmia   HLD (hyperlipidemia) 02/27/2018   HTN (hypertension) 02/27/2018   Hypercholesteremia    Hypercholesterolemia 04/07/2017   Hyperglycemia 06/19/2020   Hypertension    Hypertensive disorder 04/07/2017   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   Hypokalemia 07/17/2018   Lumbar disc disease    MRI, 2017    Obesity, Class III, BMI 40-49.9 (morbid obesity) ([place]) 05/22/2018   Overweight 03/03/2018   Palpitations 08/19/2015   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[place])    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   Physical debility 01/10/2021   Pneumonia    Pure hypercholesterolemia, unspecified 04/07/2017   Pyelonephritis 03/30/2020   Recurrent UTI 05/28/2020   SBO (small bowel obstruction) ([place]) 12/28/2020   Sepsis secondary to UTI ([place]) 04/25/2020   Severe sepsis ([place]) 05/21/2020   Severe sepsis with acute organ dysfunction ([place]) 04/25/2020   Skin infection 02/05/2021   Skin irritation 02/05/2021   Sleep apnea    Urinary tract infection due to extended-spectrum beta lactamase (ESBL) producing Escherichia coli    Uterine cancer ([place]) 01/01/2021    Past Surgical History:  Procedure Laterality Date   BLADDER REPAIR     BREAST EXCISIONAL BIOPSY Left    CARPAL TUNNEL RELEASE Right    CATARACT EXTRACTION, BILATERAL  2014   COLONOSCOPY WITH PROPOFOL N/A 07/18/2018   Procedure: COLONOSCOPY WITH PROPOFOL;  Surgeon: Laurence Spates, MD;  Location: [facility];  Service: Endoscopy;  Laterality: N/A;   ESOPHAGOGASTRODUODENOSCOPY (EGD) WITH PROPOFOL N/A 07/17/2018   Procedure: ESOPHAGOGASTRODUODENOSCOPY (EGD) WITH PROPOFOL;  Surgeon: Laurence Spates, MD;  Location: [facility];  Service: Endoscopy;  Laterality: N/A;   LAPAROSCOPY N/A 12/30/2020   Procedure: LAPAROSCOPY DIAGNOSTIC; WITH  BIOPSY OF ABDOMINAL WALL NODULES AND BIOPSY OF PERICOLONIC EXUDATE;  Surgeon: Kinsinger, Arta Bruce, MD;  [facility];  Service: General;  Laterality: N/A;   PORTACATH PLACEMENT N/A 01/09/2021   Procedure: PORT INSERTION WITH [place] & FLUORO;  Surgeon: Kieth Brightly Arta Bruce, MD;  [facility];  Service: General;  Laterality: N/A;   REPAIR RECTOCELE     TOE SURGERY Left     There were no vitals filed for this visit.   Subjective Assessment - 07/08/21 0901     Subjective The cardiologist didn't seem to think there is any existing issue for the SOB, but they will do an echocardiogram next week. I am tired today, but I don't know why. I feel a little achy today. I started on Metformin last week but I was taking it too close together and it gave me some urinary urgency. I have increased my walking a number of times to do 2 half mile walks maybe 4 times a week. My apple watch is clocking 2-3 miles per day.    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 She is presently undergoing chemotherapy with Carboplatin and Paclitaxel every 3 weeeks and has 3 more cycles.  Planning complete hysterectomy for 03/24/21-03/26/2021 and likely to resume chemo on 04/13/2021 if all goes well. The back cramping is still there and doesn't last as long.    Patient Stated Goals recover from surgery.    Currently in Pain? Yes    Pain Score 4    generalaized achiness in arms and legs   Pain Descriptors / Indicators Aching    Pain Onset More than a month ago    Pain Frequency Intermittent                               [facility] Adult PT Treatment/Exercise - 07/08/21 0001       Exercises   Other Exercises  Nu step seat 7 UE 7, x 3 min, ax beam x 3 forward and sideways down and back, heel raises x 20, bilateral HS stretches, SLS  x4 ea with cone touches, standing hip abd with yellow x 10, hip flexion with yellow x 10 B, lateral band walks yellow 5 steps x 3 laps, marching on ax x15, step and hold on ax forward x 10 ea. Nu-step again x 2:30 min . CGA of PT during balance activities     Knee/Hip Exercises: Aerobic   Nustep seat 7 arms 7, resistance 5 x 3 min.                          PT Long Term Goals - 06/30/21 0940       PT LONG TERM GOAL #1   Title Pt will be independent with HEP for generalized stregth, flexibility and balance  training    Time 4    Period Weeks    Status Achieved    Target Date 06/30/21      PT LONG TERM GOAL #2   Title Pt will perform 12 sit to stands in 30 seconds for decreased risk of falls    Baseline 9 baseline, 12 today    Time 4    Period Weeks    Status Achieved    Target Date 06/30/21      PT LONG TERM GOAL #3   Title Pt will be able to maintain bilateral SLS for greater than 10 seconds each without excessive use of arms to maintain balance    Baseline able to maintain 13 seconds on right, but not on left (3-5 sec)    Time 4    Period Weeks    Status Partially Met    Target Date 06/30/21      PT LONG TERM GOAL #4   Title Pt will report  improved heel toe gait pattern after walking short periods    Time 4    Period Weeks    Status On-going    Target Date 08/25/21      PT LONG TERM GOAL #5   Title pt will be able to go up and down stairs with minimal fatigue/SOB    Time 8    Period Weeks    Status New    Target Date 08/25/21      Additional Long Term Goals   Additional Long Term Goals Yes      PT LONG TERM GOAL #6   Title Pt will be able to stand for 7-10 minutes without undue fatigue    Time 8    Period Weeks    Status New    Target Date 08/25/21                   Plan - 07/08/21 0933     Clinical Impression Statement Pt has not purchased a spanx type support yet for the abdomen .  She did very well with therapy today and despite  requiring rest had improved tolerance for exercise today with improvement in SOB, and decreased complaints of achiness in her arms and legs. She has been able to increase her number of daily steps on her Apple watch and is now clocking 2-3 miles.    Personal Factors and Comorbidities Comorbidity 3+    Comorbidities s/p total hysterectomy 04/07/2021, aortic aneurysm, prior severe sepsis with acute organ failure, HBP    Examination-Activity Limitations Locomotion Level;Reach Overhead;Bend;Squat;Dressing;Stairs    Stability/Clinical Decision Making Stable/Uncomplicated    Rehab Potential Good    PT Frequency 2x / week    PT Duration 8 weeks    PT Treatment/Interventions ADLs/Self Care Home Management;Stair training;Gait training;Therapeutic activities;Therapeutic exercise;Balance training;Neuromuscular re-education;Manual techniques;Patient/family education;Aquatic Therapy    PT Next Visit Plan echocardiogram, balance exs, hip abd, flexor strength, core strength, rest prn.Spanx to support Left abdomen?    PT Home Exercise Plan [id number], [id number]    Consulted and Agree with Plan of Care Patient             Patient will benefit from skilled therapeutic intervention in order to improve the following deficits and impairments:  Decreased activity tolerance, Decreased balance, Decreased knowledge of precautions, Decreased endurance, Decreased range of motion, Decreased strength, Postural dysfunction, Pain, Decreased mobility  Visit Diagnosis: Muscle weakness (generalized)  Abnormal posture  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HCC)  Malignant neoplasm of  abdomen [facility])     Problem List Patient Active Problem List   Diagnosis Date Noted   Anemia due to antineoplastic chemotherapy 05/22/2021   Postoperative pain 04/21/2021   Weight loss, abnormal 04/21/2021   Pancytopenia, acquired ([place]) 03/06/2021   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   Coronary  artery calcification 03/05/2021   Arthritis 03/04/2021   GERD (gastroesophageal reflux disease) 03/04/2021   Pneumonia 03/04/2021   Genetic testing 02/13/2021   Family history of breast cancer 01/27/2021   Family history of lung cancer 01/27/2021   Dysuria 01/26/2021   Physical deconditioning 01/10/2021   Diplopia 01/09/2021   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   Dyspnea 06/19/2020   Epigastric pain 06/19/2020   Hematuria 06/19/2020   Hyperglycemia 06/19/2020   Abdominal pain 05/28/2020   Recurrent UTI 05/28/2020   Asthma    Sleep apnea    Cervical disc disease    Hepatic steatosis    History of palpitations    Hypercholesteremia    Hypertension    Lumbar disc disease    Pyelonephritis 03/30/2020   Gastrointestinal bleeding 07/25/2018   Hiatal hernia    Anemia 07/17/2018   Acute blood loss anemia 07/17/2018   Hypokalemia 07/17/2018   Gastrointestinal hemorrhage with melena 07/16/2018   Obesity, Class III, BMI 40-49.9 (morbid obesity) ([place]) 05/22/2018   Overweight 03/03/2018   Coronary artery aneurysm 03/03/2018   Chest pain with moderate risk for cardiac etiology 02/27/2018   HTN (hypertension) 02/27/2018   HLD (hyperlipidemia) 02/27/2018   Hypercholesterolemia 04/07/2017   Hypertensive disorder 04/07/2017   Pure hypercholesterolemia, unspecified 04/07/2017   Essential (primary) hypertension 04/07/2017   Palpitations 08/19/2015    Claris Pong, PT 07/08/2021, 9:57 AM  [place] @ [place], [place], [address] Phone: [phone]   Fax:  [phone]  Name: Brittany Middleton MRN: [id number] Date of Birth: 05/26/1951

## 2021-07-15 ENCOUNTER — Encounter: Payer: Self-pay | Admitting: Hematology and Oncology

## 2021-07-15 ENCOUNTER — Telehealth: Payer: Self-pay | Admitting: Oncology

## 2021-07-15 ENCOUNTER — Other Ambulatory Visit: Payer: Self-pay | Admitting: Hematology and Oncology

## 2021-07-15 DIAGNOSIS — C5701 Malignant neoplasm of right fallopian tube: Secondary | ICD-10-CM

## 2021-07-15 NOTE — Telephone Encounter (Signed)
Called Brittany Middleton and advised her that Dr. [name] has changed the CT order to chest abdomen pelvis.  Also discussed that Dr. [name] will not be ordering this in the future if not indicated and will discuss further with her at her next appointment.  She verbalized understanding and agreement.

## 2021-07-15 NOTE — Telephone Encounter (Signed)
There is not role for CT imaging of her chest as previous imaging was normal

## 2021-07-15 NOTE — Telephone Encounter (Signed)
Called Brittany Middleton regarding CT scan at [facility] on 07/14/21.  She said she had canceled it.  She also asked if her CT on 07/21/21 can be changed to chest abdomen pelvis to evaluate her lungs for metastases. Advised I will check with Dr. [name] and call her back.

## 2021-07-15 NOTE — Telephone Encounter (Signed)
Called Brittany Middleton and advised her of message from Dr. [name].  She said she will send a mychart message to Dr. [name] to discuss this further.

## 2021-07-16 ENCOUNTER — Ambulatory Visit: Payer: Medicare Other

## 2021-07-16 ENCOUNTER — Other Ambulatory Visit: Payer: Self-pay

## 2021-07-16 DIAGNOSIS — M6281 Muscle weakness (generalized): Secondary | ICD-10-CM | POA: Diagnosis not present

## 2021-07-16 DIAGNOSIS — C762 Malignant neoplasm of abdomen: Secondary | ICD-10-CM

## 2021-07-16 DIAGNOSIS — R293 Abnormal posture: Secondary | ICD-10-CM

## 2021-07-16 DIAGNOSIS — C786 Secondary malignant neoplasm of retroperitoneum and peritoneum: Secondary | ICD-10-CM

## 2021-07-16 NOTE — Therapy (Signed)
[place] @ [place], [place], [address] Phone: [phone]   Fax:  [phone]  Physical Therapy Treatment  Patient Details  Name: Brittany Middleton MRN: [id number] Date of Birth: 05/31/1951 Referring Provider (PT): Dr. 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 Last Assessment & Plan:  Formatting of this note might be different from the original. - continue home meds   Family history of breast cancer 01/27/2021   Family history of lung cancer 01/27/2021   Gastrointestinal bleeding 07/25/2018   Gastrointestinal hemorrhage with melena 07/16/2018   Genetic testing 02/13/2021   No pathogenic variants detected in [employer] MyRisk.  The report date is February 12, 2021.   The [facility] gene panel offered by [facility] includes sequencing and deletion/duplication testing of the following 48  genes: APC, ATM, AXIN2, BAP1, BARD1, BMPR1A, BRCA1, BRCA2, BRIP1, CHD1, CDK4, CDKN2A(p16 and p14ARF), CHEK2, CTNNA1, EGFR, EPCAM, FH, FLCN, GREM1, HOXB13, MEN1, M   GERD (gastroesophageal reflux disease)    Hematuria 06/19/2020   Hepatic steatosis    per imaging 2016, 2019   Hiatal hernia    History of palpitations    Holter monitor, 2017: NSR, occasional PVC, PACs, no arrhythmia   HLD (hyperlipidemia) 02/27/2018   HTN (hypertension) 02/27/2018   Hypercholesteremia    Hypercholesterolemia 04/07/2017   Hyperglycemia 06/19/2020   Hypertension    Hypertensive disorder 04/07/2017   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   Hypokalemia 07/17/2018   Lumbar disc disease    MRI, 2017    Obesity, Class III, BMI 40-49.9 (morbid obesity) ([place]) 05/22/2018   Overweight 03/03/2018   Palpitations 08/19/2015   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[place])    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   Physical debility 01/10/2021   Pneumonia    Pure hypercholesterolemia, unspecified 04/07/2017   Pyelonephritis 03/30/2020   Recurrent UTI 05/28/2020   SBO (small bowel obstruction) ([place]) 12/28/2020   Sepsis secondary to UTI ([place]) 04/25/2020   Severe sepsis ([place]) 05/21/2020   Severe sepsis with acute organ dysfunction ([place]) 04/25/2020   Skin infection 02/05/2021   Skin irritation 02/05/2021   Sleep apnea    Urinary tract infection due to extended-spectrum beta lactamase (ESBL) producing Escherichia coli    Uterine cancer ([place])  01/01/2021    Past Surgical History:  Procedure Laterality Date   BLADDER REPAIR     BREAST EXCISIONAL BIOPSY Left    CARPAL TUNNEL RELEASE Right    CATARACT EXTRACTION, BILATERAL  2014   COLONOSCOPY WITH PROPOFOL N/A 07/18/2018   Procedure: COLONOSCOPY WITH PROPOFOL;  Surgeon: Laurence Spates, MD;  Location: [facility];  Service: Endoscopy;  Laterality: N/A;   ESOPHAGOGASTRODUODENOSCOPY (EGD) WITH PROPOFOL N/A 07/17/2018   Procedure: ESOPHAGOGASTRODUODENOSCOPY (EGD) WITH PROPOFOL;  Surgeon:  Laurence Spates, MD;  Location: [facility];  Service: Endoscopy;  Laterality: N/A;   LAPAROSCOPY N/A 12/30/2020   Procedure: LAPAROSCOPY DIAGNOSTIC; WITH  BIOPSY OF ABDOMINAL WALL NODULES AND BIOPSY OF PERICOLONIC EXUDATE;  Surgeon: Kinsinger, Arta Bruce, MD;  [facility];  Service: General;  Laterality: N/A;   PORTACATH PLACEMENT N/A 01/09/2021   Procedure: PORT INSERTION WITH [place] & FLUORO;  Surgeon: Kieth Brightly Arta Bruce, MD;  [facility];  Service: General;  Laterality: N/A;   REPAIR RECTOCELE     TOE SURGERY Left     There were no vitals filed for this visit.   Subjective Assessment - 07/16/21 1109     Subjective I am really achy and tired. I almost didn't come.  I didn't sleep well several days this week. I slept 13 hours last night but now I feel sluggish and fatigued. I needed 4 rest breaks this am when I walked my dog. It took me almost 20 min to do my normal 10 min walk.    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 She is presently undergoing chemotherapy with Carboplatin and Paclitaxel every 3 weeeks and has 3 more cycles.  Planning complete hysterectomy for 03/24/21-03/26/2021 and likely to resume chemo on 04/13/2021 if all goes well. The back cramping is still there and doesn't last as long.    Patient Stated Goals recover from surgery.    Currently in Pain? Yes    Pain Score 4     Pain Location --   body aching with exertion   Pain Orientation Left;Right    Pain Descriptors / Indicators Aching    Pain Onset More than a month ago    Pain Frequency Intermittent                               [facility] Adult PT Treatment/Exercise - 07/16/21 0001       Exercises   Other Exercises  standing hip abduction, and hip flexion 3 x 5 yellow band, lateral band walks 3 laps 5 steps, ax beam x 6 lengths forward, and 2 laps sideways, tandem stance x 1 bilaterally to failure, and SLS x4 ea to failure,heel raises  x 20, ppt x 10, bilateral piriformis stretch x1 30 sec, supine opposite arm to knee marching x 10, LTR x 1 to right. Bilateral ankle circles with leg across opposite knee in supine.                    PT Education - 07/16/21 1153     Education Details hip abd and hip flexion with yellow band, lateral band walks    Person(s) Educated Patient    Methods Explanation;Handout    Comprehension Returned demonstration                 PT Long Term Goals - 06/30/21 0940       PT LONG TERM GOAL #1  Title Pt will be independent with HEP for generalized stregth, flexibility and balance training    Time 4    Period Weeks    Status Achieved    Target Date 06/30/21      PT LONG TERM GOAL #2   Title Pt will perform 12 sit to stands in 30 seconds for decreased risk of falls    Baseline 9 baseline, 12 today    Time 4    Period Weeks    Status Achieved    Target Date 06/30/21      PT LONG TERM GOAL #3   Title Pt will be able to maintain bilateral SLS for greater than 10 seconds each without excessive use of arms to maintain balance    Baseline able to maintain 13 seconds on right, but not on left (3-5 sec)    Time 4    Period Weeks    Status Partially Met    Target Date 06/30/21      PT LONG TERM GOAL #4   Title Pt will report  improved heel toe gait pattern after walking short periods    Time 4    Period Weeks    Status On-going    Target Date 08/25/21      PT LONG TERM GOAL #5   Title pt will be able to go up and down stairs with minimal fatigue/SOB    Time 8    Period Weeks    Status New    Target Date 08/25/21      Additional Long Term Goals   Additional Long Term Goals Yes      PT LONG TERM GOAL #6   Title Pt will be able to stand for 7-10 minutes without undue fatigue    Time 8    Period Weeks    Status New    Target Date 08/25/21                   Plan - 07/16/21 1202     Clinical Impression Statement Pt felt very achy and sluggish  prior to therapy today.  Incorporated standing strength, balance and supine strength and flexibility. Pt required a few rest breaks but continues to improve with endurance and SOB is also greatly improved.  Left piriformis still much tighter than right. She experienced some left lateral abdominal discomfort towards end of rx when doing supine exercises. Improved with rest.  Achiness limited more to shoulders and calves after rx and not as widespread as when she came in. HEP updated with hip theraband for flexion and abd with yellow band.    Personal Factors and Comorbidities Comorbidity 3+    Comorbidities s/p total hysterectomy 04/07/2021, aortic aneurysm, prior severe sepsis with acute organ failure, HBP    Examination-Activity Limitations Locomotion Level;Reach Overhead;Bend;Squat;Dressing;Stairs    Stability/Clinical Decision Making Stable/Uncomplicated    Rehab Potential Good    PT Frequency 2x / week    PT Duration 8 weeks    PT Treatment/Interventions ADLs/Self Care Home Management;Stair training;Gait training;Therapeutic activities;Therapeutic exercise;Balance training;Neuromuscular re-education;Manual techniques;Patient/family education;Aquatic Therapy    PT Next Visit Plan balance exs, hip abd, flexor strength, core strength, rest prn.Spanx to support Left abdomen?    PT Home Exercise Plan [id number]    Consulted and Agree with Plan of Care Patient             Patient will benefit from skilled therapeutic intervention in order to improve the following deficits and impairments:  Decreased activity tolerance, Decreased balance, Decreased knowledge of precautions, Decreased endurance, Decreased range of motion, Decreased strength, Postural dysfunction, Pain, Decreased mobility  Visit Diagnosis: Muscle weakness (generalized)  Abnormal posture  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HCC)  Malignant neoplasm of abdomen [facility])     Problem  List Patient Active Problem List   Diagnosis Date Noted   Anemia due to antineoplastic chemotherapy 05/22/2021   Postoperative pain 04/21/2021   Weight loss, abnormal 04/21/2021   Pancytopenia, acquired ([place]) 03/06/2021   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   Coronary artery calcification 03/05/2021   Arthritis 03/04/2021   GERD (gastroesophageal reflux disease) 03/04/2021   Pneumonia 03/04/2021   Genetic testing 02/13/2021   Family history of breast cancer 01/27/2021   Family history of lung cancer 01/27/2021   Dysuria 01/26/2021   Physical deconditioning 01/10/2021   Diplopia 01/09/2021   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   Dyspnea 06/19/2020   Epigastric pain 06/19/2020   Hematuria 06/19/2020   Hyperglycemia 06/19/2020   Abdominal pain 05/28/2020   Recurrent UTI 05/28/2020   Asthma    Sleep apnea    Cervical disc disease    Hepatic steatosis    History of palpitations    Hypercholesteremia    Hypertension    Lumbar disc disease    Pyelonephritis 03/30/2020   Gastrointestinal bleeding 07/25/2018   Hiatal hernia    Anemia 07/17/2018   Acute blood loss anemia 07/17/2018   Hypokalemia 07/17/2018   Gastrointestinal hemorrhage with melena 07/16/2018   Obesity, Class III, BMI 40-49.9 (morbid obesity) ([place]) 05/22/2018   Overweight 03/03/2018   Coronary artery aneurysm 03/03/2018   Chest pain with moderate risk for cardiac etiology 02/27/2018   HTN (hypertension) 02/27/2018   HLD (hyperlipidemia) 02/27/2018   Hypercholesterolemia 04/07/2017   Hypertensive disorder 04/07/2017   Pure hypercholesterolemia, unspecified 04/07/2017   Essential (primary) hypertension 04/07/2017   Palpitations 08/19/2015    Claris Pong, PT 07/16/2021, 12:07 PM  [place] @ [place], [place],  [address] Phone: [phone]   Fax:  [phone]  Name: Crystina Borrayo MRN: [id number] Date of Birth: February 17, 1951

## 2021-07-16 NOTE — Patient Instructions (Signed)
Access Code: [email or web address] URL: [email or web address] Date: 07/16/2021 Prepared by: Cheral Almas  Exercises Standing Hip Abduction Kicks - 1 x daily - 3 x weekly - 1 sets - 10 reps Standing Hip Flexion with Resistance Loop - 1 x daily - 3 x weekly - 1 sets - 10 reps Lateral band walks

## 2021-07-21 ENCOUNTER — Other Ambulatory Visit: Payer: Self-pay

## 2021-07-21 ENCOUNTER — Ambulatory Visit (HOSPITAL_COMMUNITY): Payer: Medicare Other

## 2021-07-21 ENCOUNTER — Inpatient Hospital Stay: Payer: Medicare Other

## 2021-07-21 ENCOUNTER — Ambulatory Visit (HOSPITAL_COMMUNITY)
Admission: RE | Admit: 2021-07-21 | Discharge: 2021-07-21 | Disposition: A | Discharge status: Home / Self Care | Payer: Medicare Other | Source: Ambulatory Visit | Attending: Hematology and Oncology | Admitting: Hematology and Oncology

## 2021-07-21 ENCOUNTER — Encounter (HOSPITAL_COMMUNITY): Payer: Self-pay

## 2021-07-21 DIAGNOSIS — C5701 Malignant neoplasm of right fallopian tube: Secondary | ICD-10-CM | POA: Insufficient documentation

## 2021-07-21 DIAGNOSIS — C762 Malignant neoplasm of abdomen: Secondary | ICD-10-CM

## 2021-07-21 DIAGNOSIS — C786 Secondary malignant neoplasm of retroperitoneum and peritoneum: Secondary | ICD-10-CM

## 2021-07-21 LAB — CBC WITH DIFFERENTIAL (CANCER CENTER ONLY)
Abs Immature Granulocytes: 0.01 10*3/uL (ref 0.00–0.07)
Basophils Absolute: 0 10*3/uL (ref 0.0–0.1)
Basophils Relative: 1 %
Eosinophils Absolute: 0.1 10*3/uL (ref 0.0–0.5)
Eosinophils Relative: 3 %
HCT: 32 % — ABNORMAL LOW (ref 36.0–46.0)
Hemoglobin: 10.2 g/dL — ABNORMAL LOW (ref 12.0–15.0)
Immature Granulocytes: 0 %
Lymphocytes Relative: 29 %
Lymphs Abs: 0.7 10*3/uL (ref 0.7–4.0)
MCH: 29.6 pg (ref 26.0–34.0)
MCHC: 31.9 g/dL (ref 30.0–36.0)
MCV: 92.8 fL (ref 80.0–100.0)
Monocytes Absolute: 0.3 10*3/uL (ref 0.1–1.0)
Monocytes Relative: 11 %
Neutro Abs: 1.5 10*3/uL — ABNORMAL LOW (ref 1.7–7.7)
Neutrophils Relative %: 56 %
Platelet Count: 187 10*3/uL (ref 150–400)
RBC: 3.45 MIL/uL — ABNORMAL LOW (ref 3.87–5.11)
RDW: 17.5 % — ABNORMAL HIGH (ref 11.5–15.5)
WBC Count: 2.6 10*3/uL — ABNORMAL LOW (ref 4.0–10.5)
nRBC: 0 % (ref 0.0–0.2)

## 2021-07-21 LAB — CMP (CANCER CENTER ONLY)
ALT: 12 U/L (ref 0–44)
AST: 16 U/L (ref 15–41)
Albumin: 4.1 g/dL (ref 3.5–5.0)
Alkaline Phosphatase: 52 U/L (ref 38–126)
Anion gap: 8 (ref 5–15)
BUN: 15 mg/dL (ref 8–23)
CO2: 29 mmol/L (ref 22–32)
Calcium: 9.4 mg/dL (ref 8.9–10.3)
Chloride: 105 mmol/L (ref 98–111)
Creatinine: 0.56 mg/dL (ref 0.44–1.00)
GFR, Estimated: >60 mL/min (ref >60)
Glucose, Bld: 97 mg/dL (ref 70–99)
Potassium: 3.8 mmol/L (ref 3.5–5.1)
Sodium: 142 mmol/L (ref 135–145)
Total Bilirubin: 0.6 mg/dL (ref 0.3–1.2)
Total Protein: 6.9 g/dL (ref 6.5–8.1)

## 2021-07-21 MED ORDER — HEPARIN SOD (PORK) LOCK FLUSH 100 UNIT/ML IV SOLN
INTRAVENOUS | Status: AC
  Administered 2021-07-21: 500 [IU] via INTRAVENOUS
  Filled 2021-07-21: qty 5

## 2021-07-21 MED ORDER — IOHEXOL 300 MG/ML  SOLN
100.0000 mL | Freq: Once | INTRAMUSCULAR | Status: AC | PRN
  Administered 2021-07-21: 100 mL via INTRAVENOUS

## 2021-07-21 MED ORDER — SODIUM CHLORIDE (PF) 0.9 % IJ SOLN
INTRAMUSCULAR | Status: AC
  Filled 2021-07-21: qty 50

## 2021-07-21 MED ORDER — HEPARIN SOD (PORK) LOCK FLUSH 100 UNIT/ML IV SOLN: 500.0000 [IU] | Freq: Once | INTRAVENOUS | Status: AC

## 2021-07-21 MED ORDER — SODIUM CHLORIDE 0.9% FLUSH
10.0000 mL | Freq: Once | INTRAVENOUS | Status: AC
  Administered 2021-07-21: 10 mL

## 2021-07-22 ENCOUNTER — Encounter (INDEPENDENT_AMBULATORY_CARE_PROVIDER_SITE_OTHER): Payer: Self-pay

## 2021-07-22 LAB — CA 125: Cancer Antigen (CA) 125: 3.1 U/mL (ref 0.0–38.1)

## 2021-07-23 ENCOUNTER — Encounter: Payer: Self-pay | Admitting: Hematology and Oncology

## 2021-07-23 ENCOUNTER — Inpatient Hospital Stay: Payer: Medicare Other | Attending: Hematology and Oncology | Admitting: Hematology and Oncology

## 2021-07-23 ENCOUNTER — Ambulatory Visit: Payer: Medicare Other | Attending: Hematology and Oncology

## 2021-07-23 ENCOUNTER — Other Ambulatory Visit: Payer: Self-pay

## 2021-07-23 VITALS — BP 136/53 | HR 63 | Temp 97.9°F | Resp 18 | Ht 65.0 in | Wt 232.0 lb

## 2021-07-23 DIAGNOSIS — C5701 Malignant neoplasm of right fallopian tube: Secondary | ICD-10-CM | POA: Insufficient documentation

## 2021-07-23 DIAGNOSIS — C762 Malignant neoplasm of abdomen: Secondary | ICD-10-CM | POA: Diagnosis present

## 2021-07-23 DIAGNOSIS — J90 Pleural effusion, not elsewhere classified: Secondary | ICD-10-CM | POA: Insufficient documentation

## 2021-07-23 DIAGNOSIS — M6281 Muscle weakness (generalized): Secondary | ICD-10-CM | POA: Diagnosis not present

## 2021-07-23 DIAGNOSIS — C786 Secondary malignant neoplasm of retroperitoneum and peritoneum: Secondary | ICD-10-CM | POA: Insufficient documentation

## 2021-07-23 DIAGNOSIS — D61818 Other pancytopenia: Secondary | ICD-10-CM | POA: Diagnosis not present

## 2021-07-23 DIAGNOSIS — K5730 Diverticulosis of large intestine without perforation or abscess without bleeding: Secondary | ICD-10-CM | POA: Diagnosis not present

## 2021-07-23 DIAGNOSIS — R293 Abnormal posture: Secondary | ICD-10-CM | POA: Insufficient documentation

## 2021-07-23 NOTE — Assessment & Plan Note (Signed)
This is due to side effects of treatment Anticipate it will recover in a few months Observe only

## 2021-07-23 NOTE — Progress Notes (Signed)
[place] OFFICE PROGRESS NOTE  Patient Care Team: [name], MD as PCP - General (Internal Medicine) Revankar, [name], MD as PCP - Cardiology (Cardiology) [name], MD as Consulting Physician (Cardiology) Awanda Mink Craige Cotta, RN as Oncology Nurse Navigator (Oncology)  ASSESSMENT & PLAN:  Adenocarcinoma of right fallopian tube [facility]) I have reviewed all imaging studies with the patient and her son as well as blood work She had mild residual pancytopenia from recent treatment otherwise is recovering well from surgery We discussed future follow-up We discussed the risk and benefits of removing her port and she agreed for her port to be removed We will schedule her to be seen at the GYN clinic in 3 months and see me in 6 months We discussed the limitation of tumor marker monitoring in the future  Pancytopenia, acquired [facility]) This is due to side effects of treatment Anticipate it will recover in a few months Observe only  Obesity, Class III, BMI 40-49.9 (morbid obesity) ([place]) We have extensive discussion about the role of dietary modification, weight loss, increase physical activity as natural preventative strategies for healthy living and risk reduction against cancer recurrence The patient appears highly motivated  Orders Placed This Encounter  Procedures   IR REMOVAL TUN ACCESS W/ PORT W/O FL MOD SED    Standing Status:   Future    Standing Expiration Date:   07/23/2022    Order Specific Question:   Reason for exam:    Answer:   no need port    Order Specific Question:   Preferred Imaging Location?    Answer:   [facility]    All questions were answered. The patient knows to call the clinic with any problems, questions or concerns. The total time spent in the appointment was 40 minutes encounter with patients including review of chart and various tests results, discussions about plan of care and coordination of care plan   [name],  MD 07/23/2021 11:03 AM  INTERVAL HISTORY: Please see below for problem oriented charting. she returns for treatment follow-up She returns with her son to review test results She tolerated last treatment well Her only complaint is slight change in bowel movement requiring regular laxative therapy She is highly motivated, changing her diet drastically and start to become more active She enjoys tai chi recently She had some questions related to health related changes and lifestyle  REVIEW OF SYSTEMS:   Constitutional: Denies fevers, chills or abnormal weight loss Eyes: Denies blurriness of vision Ears, nose, mouth, throat, and face: Denies mucositis or sore throat Respiratory: Denies cough, dyspnea or wheezes Cardiovascular: Denies palpitation, chest discomfort or lower extremity swelling Gastrointestinal:  Denies nausea, heartburn or change in bowel habits Skin: Denies abnormal skin rashes Lymphatics: Denies new lymphadenopathy or easy bruising Neurological:Denies numbness, tingling or new weaknesses Behavioral/Psych: Mood is stable, no new changes  All other systems were reviewed with the patient and are negative.  I have reviewed the past medical history, past surgical history, social history and family history with the patient and they are unchanged from previous note.  ALLERGIES:  is allergic to macrobid [nitrofurantoin], ciprofloxacin, codeine, and azithromycin.  MEDICATIONS:  Current Outpatient Medications  Medication Sig Dispense Refill   atorvastatin (LIPITOR) 20 MG tablet Take 20 mg by mouth daily.     acetaminophen (TYLENOL) 500 MG tablet Take 1,000 mg by mouth every 6 (six) hours as needed for mild pain, moderate pain, fever or headache.  albuterol (PROVENTIL) (2.5 MG/3ML) 0.083% nebulizer solution Take 2.5 mg by nebulization every 6 (six) hours as needed for wheezing or shortness of breath.     alclomethasone (ACLOVATE) 0.05 % ointment Apply 1 application topically 2  (two) times daily.     BIOTIN PO Take 1 tablet by mouth daily.     Cholecalciferol (VITAMIN D-3) 25 MCG (1000 UT) CAPS Take 1,000 Units by mouth daily.     lidocaine-prilocaine (EMLA) cream Apply to affected area once 30 g 3   Melatonin 5 MG CHEW Chew 5 mg by mouth at bedtime.      Multiple Vitamins-Minerals (MULTIVITAMIN WITH MINERALS) tablet Take 1 tablet by mouth daily.     pantoprazole (PROTONIX) 40 MG tablet Take 1 tablet (40 mg total) by mouth daily. 30 tablet 1   polyethylene glycol (MIRALAX / GLYCOLAX) 17 g packet Take 8.5 g by mouth 2 (two) times daily.     No current facility-administered medications for this visit.   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 - Diverticulosis in the sigmoid colon and in the descending colon. - No specimens collected. - Blood in stool without cause found on endoscopic exam   05/08/2020 Imaging   1. Bladder is decompressed though demonstrates significant perivesicular hazy stranding, mucosal hyperemia and edematous mural thickening. Findings are suggestive of cystitis. Correlate with urinalysis. No abnormal perinephric or periureteral stranding or other features to  suggest an ascending tract infection at this time. 2. Colonic diverticulosis without evidence of acute diverticulitis. 3. Aortic Atherosclerosis (ICD10-I70.0).   12/28/2020 Imaging   1. 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 Differential for peritoneal carcinomatosis is primarily neoplastic and includes cancers of the ovary, appendix, colon, pancreas and stomach. PET-CT may be helpful for further characterization. Tissue sampling of the mesentery may be eventually required for diagnosis. 2. Mildly distended small bowel loops throughout the abdomen and pelvis, with fluid and associated air-fluid levels throughout the nondistended small bowel, suggesting a mild ileus versus partial small bowel obstruction. Favor partial small bowel obstruction with transition zone in the RIGHT lower quadrant, likely related to aforementioned neoplastic peritoneal implants and/or adhesions. 3. Small free fluid within the RIGHT upper quadrant and LEFT upper quadrant. No abscess collection seen. No free intraperitoneal air seen. 4. Extensive colonic diverticulosis without evidence of acute diverticulitis. 5. Small chronic pleural effusions with associated atelectasis.   12/29/2020 Tumor Marker   Patient's tumor was tested for the following markers: CA-125. Results of the tumor marker test revealed 95.1.   12/30/2020 Surgery   Preoperative diagnosis: abdominal nodules   Postoperative diagnosis: same  Procedure: diagnostic laparoscopy with abdominal wall biopsy   Surgeon: Gurney Maxin, M.D.    Indications for procedure: Chelise Hanger is a 71 y.o. year old female with symptoms of nausea, vomiting, and abdominal pain. Work up was concerning for cancer with abdominal wall studding.   Description of procedure: The patient was brought into the operative  suite. Anesthesia was administered with General endotracheal anesthesia. WHO checklist was applied. The patient was then placed in supine position. The area was prepped and draped in the usual sterile fashion.   A small left subcostal incision was made. A 5m trocar was used to gain access to the peritoneal cavity by optical entry technique. Pneumoperitoneum was applied with a high flow and low pressure. The laparoscope was reinserted to confirm position.   On initial visualization of the abdomen, there were multiple nodules throughout the abdominal wall. There were multiple nodules over the small intestine and mesentery. There was some loops of small intestine that were matted together. There was a large white mass in the left upper quadrant. Multiple areas of peritoneum were removed with harmonic scalpel. The large mass was inspected. It appeared to be related to the colon and omentum. Grasper was used to remove some superficial tissue and was sent for culture.   The abdomen was desufflated. The incisions were closed with 4-0 monocryl subcuticular stitch.   12/30/2020 Pathology Results   FINAL MICROSCOPIC DIAGNOSIS:   A. ABDOMINAL WALL, NODULE, EXCISION:  -  Metastatic adenocarcinoma  -  See comment   B. PERI COLONIC EXUDATE, EXCISION:  -  Metastatic adenocarcinoma  -  See comment   COMMENT:   By immunohistochemistry, the neoplastic cells are positive for cytokeratin 7, PAX8 and WT1 but negative for cytokeratin 20, CDX2, p53 and TTF-1.  The morphology and immunophenotype are consistent with a gynecologic primary.     01/01/2021 Initial Diagnosis   Peritoneal carcinomatosis ([place]   01/08/2021 Cancer Staging   Staging form: Ovary, Fallopian Tube, and Primary Peritoneal Carcinoma, AJCC 8th Edition - Clinical stage from 01/08/2021: cT3, cN0, cM0 - Signed by GHeath Lark MD on 01/08/2021 Stage prefix: Initial diagnosis    01/12/2021 - 03/11/2021 Chemotherapy   Patient is on Treatment Plan :  OVARIAN Carboplatin (AUC 6) / Paclitaxel (175) q21d x 6 cycles     01/17/2021 Imaging   MRI brain No evidence of intracranial metastatic disease or other acute abnormality   02/12/2021 Genetic Testing   No pathogenic variants detected in [facility] [employer].  The report date is February 12, 2021.   The [facility] panel offered by [employer] sequencing and deletion/duplication testing of the following 48 genes: APC, ATM, AXIN2, BAP1, BARD1, BMPR1A, BRCA1, BRCA2, BRIP1, CHD1, CDK4, CDKN2A(p16 and p14ARF), CHEK2, CTNNA1, EGFR, EPCAM, FH, FLCN, GREM1, HOXB13, MEN1, MET, MITF , MLH1, MSH2, MSH3, MSH6, MUTYH, NTHL1, PALB2, PMS2, POLD1, POLE, PTEN, RAD51C, RAD51D, RET, SDHA, SDHB, SDHC, SDHD, SMAD4, STK11,TERT, TP53, TSC1, TSC2, and VHL.  Unless otherwise specified, all coding regions and flanking non-coding regions are analyzed for sequence variation. Analysis of flanking intronic regions typically do not extend more than 20 bp before and 10 bp after each exon, though the exact region may be adjusted based on the presence of either potentially significant variants or highly repetitive sequences. Coding regions and proximal promoter regions near the transcription start sites are analyzed for large deletions or duplications. Specific genes are tested only for sequence and/or CNVs within limited regions. Limited clinically relevant regions are  included for EGFR (sequencing and CNV analysis of exons 18-21),RET (sequencing and CNV analysis of exons 5, 8, 10, 11, and 13-16), and MITF (sequencing of position c.952). Only CNV analysis of the last two exons of EPCAM is performed. CNV analysis of GREM1 includes the upstream region overlapping the adjacent gene SCG5. MSH3 exon 1 contains a long polyalanine repeat that can interfere with variant calling; therefore, MSH3 analysis excludes c.121 to c.237. Only sequence analysis of the exons encompassing the exonuclease domains of these genes is  performed (POLD1 c.841 to c.1686, POLE c.802 to c.1473). Limited promoter regions in selected genes undergo sequence analysis including TERT (c.-71 to c.-1), and APC Promoter 1B (c.-195 to c.-190 and c.-125 ([id number]).   HRD testing pending.    03/02/2021 Imaging   Outside imaging at [facility] improved peritoneal carcinomatosis. No evidence of new metastatic disease or acute abdominal pathology.    03/02/2021 Imaging   Outside CT imaging done at [facility] improved peritoneal carcinomatosis. No evidence of new metastatic disease or acute abdominal pathology   03/02/2021 - 03/03/2021 Hospital Admission   She was hospitalized briefly for evaluation of hematochezia at [facility].  She underwent CT imaging and colonoscopy and was subsequently discharged   03/03/2021 Procedure   Colonoscopy was performed at [facility]  Findings: Diverticula were found in the entire colon. Internal hemorrhoids were found during retroflexion.  The hemorrhoids were Grade I (internal hemorrhoids that do not prolapse). Impression: - Diverticulosis in the entire examined colon. Likely source of bleeding. - Internal hemorrhoids.   04/07/2021 Surgery   PROCEDURES:  Exam under anesthesia Diagnostic laparoscopy Exploratory laparotomy Lysis of adhesions (>30 minutes) Infragastric omentectomy Total abdominal hysterectomy and bilateral salpingo-oophorectomy Optimal (R0) interval tumor debulking  SURGEON: Surgeon(s) and Role: Mellody Drown, MD - Primary * Salinaro, Whitney Muse, MD - Resident - Assisting * Riki Sheer, Owens Shark, MD - Fellow  INDICATION(S): 71 y.o. who presented with carcinomatosis and mildly elevated CA125 to 95 with biopsy showing mullerian adenocarcinoma, s/p 3 cycles of neoadjuvant chemotherapy with normalization of CA125 and decreased disease burden on imaging.  OPERATIVE FINDINGS:  On exam, no palpable masses. On laparoscopy, omentum thickened and retracted along the transverse colon,  pelvic adhesive disease. On laparotomy, omentum thickened, retracted, and densely adherent to the underlying colon mesentery, but without definitive viable tumor. Adhesions of the omentum to the descending colon, rectosigmoid colon to the left adnexa and posterior uterus and cervix, loop of ileum to the right adnexa, and bladder to anterior uterus and cervix. Transverse colon with small diverticulum that was over-sewed. Appendix normal and retrocecal. No grossly visible tumor in the abdomen including smooth diaphragm and liver surface, normal stomach, small bowel, colon, and normal appearing uterus, bilateral fallopian tubes, and bilateral ovaries.  At the conclusion of the case, no gross residual disease remained (R0).    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   05/22/2021 - 06/19/2021 Chemotherapy   Patient is on Treatment Plan : OVARIAN Carboplatin (AUC 6) / Paclitaxel (175) q21d x 6 cycles      06/19/2021 Tumor Marker   Patient's tumor was tested for the following markers: CA-125. Results of the tumor marker test revealed 3.   07/22/2021 Tumor Marker   Patient's tumor was tested for the following markers: CA-125. Results of the  tumor marker test revealed 3.1.   07/23/2021 Imaging   1. No evidence metastatic ovarian carcinoma. 2. No peritoneal nodularity or fluid. 3. Post hysterectomy and oophorectomy         PHYSICAL EXAMINATION: ECOG PERFORMANCE STATUS: 1 - Symptomatic but completely ambulatory  Vitals:   07/23/21 0916  BP: (!) 136/53  Pulse: 63  Resp: 18  Temp: 97.9 F (36.6 C)  SpO2: 97%   Filed Weights   07/23/21 0916  Weight: 232 lb (105.2 kg)    GENERAL:alert, no distress and comfortable  NEURO: alert & oriented x 3  with fluent speech, no focal motor/sensory deficits  LABORATORY DATA:  I have reviewed the data as listed    Component Value Date/Time   NA 142 07/21/2021 0828   NA 143 12/20/2019 0829   K 3.8 07/21/2021 0828   CL 105 07/21/2021 0828   CO2 29 07/21/2021 0828   GLUCOSE 97 07/21/2021 0828   BUN 15 07/21/2021 0828   BUN 16 12/20/2019 0829   CREATININE 0.56 07/21/2021 0828   CALCIUM 9.4 07/21/2021 0828   PROT 6.9 07/21/2021 0828   PROT 6.4 02/04/2020 1036   ALBUMIN 4.1 07/21/2021 0828   ALBUMIN 4.2 02/04/2020 1036   AST 16 07/21/2021 0828   ALT 12 07/21/2021 0828   ALKPHOS 52 07/21/2021 0828   BILITOT 0.6 07/21/2021 0828   GFRNONAA >60 07/21/2021 0828   GFRAA 104 12/20/2019 0829    No results found for: SPEP, UPEP  Lab Results  Component Value Date   WBC 2.6 (L) 07/21/2021   NEUTROABS 1.5 (L) 07/21/2021   HGB 10.2 (L) 07/21/2021   HCT 32.0 (L) 07/21/2021   MCV 92.8 07/21/2021   PLT 187 07/21/2021      Chemistry      Component Value Date/Time   NA 142 07/21/2021 0828   NA 143 12/20/2019 0829   K 3.8 07/21/2021 0828   CL 105 07/21/2021 0828   CO2 29 07/21/2021 0828   BUN 15 07/21/2021 0828   BUN 16 12/20/2019 0829   CREATININE 0.56 07/21/2021 0828      Component Value Date/Time   CALCIUM 9.4 07/21/2021 0828   ALKPHOS 52 07/21/2021 0828   AST 16 07/21/2021 0828   ALT 12 07/21/2021 0828   BILITOT 0.6 07/21/2021 0828       RADIOGRAPHIC STUDIES: I have reviewed multiple imaging studies with the patient and her son I have personally reviewed the radiological images as listed and agreed with the findings in the report. CT CHEST ABDOMEN PELVIS W CONTRAST  Result Date: 07/22/2021 CLINICAL DATA:  Ovarian carcinoma diagnosed July 22. Chemotherapy complete. EXAM: CT CHEST, ABDOMEN, AND PELVIS WITH CONTRAST TECHNIQUE: Multidetector CT imaging of the chest, abdomen and pelvis was performed following the standard protocol during bolus administration of intravenous  contrast. RADIATION DOSE REDUCTION: This exam was performed according to the departmental dose-optimization program which includes automated exposure control, adjustment of the mA and/or kV according to patient size and/or use of iterative reconstruction technique. CONTRAST:  111m OMNIPAQUE IOHEXOL 300 MG/ML  SOLN COMPARISON:  None. FINDINGS: CT CHEST FINDINGS Cardiovascular: Port in the anterior chest wall with tip in distal SVC. No significant vascular findings. Normal heart size. No pericardial effusion. Mediastinum/Nodes: No axillary or supraclavicular adenopathy. No mediastinal or hilar adenopathy. No pericardial fluid. Esophagus normal. Lungs/Pleura: No suspicious pulmonary nodules. Normal pleural. Airways normal. Musculoskeletal: No aggressive osseous lesion. CT ABDOMEN AND PELVIS FINDINGS Hepatobiliary: No focal hepatic lesion.  No biliary duct  dilatation Pancreas: Pancreas is normal. No ductal dilatation. No pancreatic inflammation. Spleen: Normal spleen Adrenals/urinary tract: Adrenal glands and kidneys are normal. The ureters and bladder normal. Stomach/Bowel: Stomach, small bowel, appendix, and cecum are normal. The colon and rectosigmoid colon are normal. Vascular/Lymphatic: Abdominal aorta is normal caliber. There is no retroperitoneal or periportal lymphadenopathy. No pelvic lymphadenopathy. Reproductive: Post hysterectomy anatomy Other: No peritoneal metastasis Musculoskeletal: No aggressive osseous lesion. IMPRESSION: 1. No evidence metastatic ovarian carcinoma. 2. No peritoneal nodularity or fluid. 3. Post hysterectomy and oophorectomy Electronically Signed   By: Suzy Bouchard M.D.   On: 07/22/2021 15:11

## 2021-07-23 NOTE — Assessment & Plan Note (Signed)
I have reviewed all imaging studies with the patient and her son as well as blood work She had mild residual pancytopenia from recent treatment otherwise is recovering well from surgery We discussed future follow-up We discussed the risk and benefits of removing her port and she agreed for her port to be removed We will schedule her to be seen at the GYN clinic in 3 months and see me in 6 months We discussed the limitation of tumor marker monitoring in the future

## 2021-07-23 NOTE — Assessment & Plan Note (Signed)
We have extensive discussion about the role of dietary modification, weight loss, increase physical activity as natural preventative strategies for healthy living and risk reduction against cancer recurrence The patient appears highly motivated

## 2021-07-23 NOTE — Therapy (Addendum)
 [facility] @ [facility] [address] [place], [place], [address] Phone: [phone]   Fax:  [phone]  Physical Therapy Treatment  Patient Details  Name: Brittany Middleton MRN: [id number] Date of Birth: Nov 26, 1950 Referring Provider (PT): Dr. 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 Last Assessment & Plan:  Formatting of this note might be different from the original. - continue home meds   Family history of breast cancer 01/27/2021   Family history of lung cancer 01/27/2021   Gastrointestinal bleeding 07/25/2018   Gastrointestinal hemorrhage with melena 07/16/2018   Genetic testing 02/13/2021   No pathogenic variants detected in [employer] [facility]C[employer] + MyRisk.  The report date is February 12, 2021.   The [facility] gene panel offered by [facility], The includes sequencing and deletion/duplication testing of the following 48 genes: APC, ATM, AXIN2, BAP1,  BARD1, BMPR1A, BRCA1, BRCA2, BRIP1, CHD1, CDK4, CDKN2A(p16 and p14ARF), CHEK2, CTNNA1, EGFR, EPCAM, FH, FLCN, GREM1, HOXB13, MEN1, M   GERD (gastroesophageal reflux disease)    Hematuria 06/19/2020   Hepatic steatosis    per imaging 2016, 2019   Hiatal hernia    History of palpitations    Holter monitor, 2017: NSR, occasional PVC, PACs, no arrhythmia   HLD (hyperlipidemia) 02/27/2018   HTN (hypertension) 02/27/2018   Hypercholesteremia    Hypercholesterolemia 04/07/2017   Hyperglycemia 06/19/2020   Hypertension    Hypertensive disorder 04/07/2017   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   Hypokalemia 07/17/2018   Lumbar disc disease    MRI, 2017    Obesity, Class III, 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   Overweight 03/03/2018   Palpitations 08/19/2015   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HCC)    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   Physical debility 01/10/2021   Pneumonia    Pure hypercholesterolemia, unspecified 04/07/2017   Pyelonephritis 03/30/2020   Recurrent UTI 05/28/2020   SBO (small bowel obstruction) (HCC) 12/28/2020   Sepsis secondary to UTI (HCC) 04/25/2020   Severe sepsis (HCC) 05/21/2020   Severe sepsis with acute organ dysfunction (HCC) 04/25/2020   Skin infection 02/05/2021   Skin irritation 02/05/2021   Sleep apnea    Urinary tract infection due to extended-spectrum beta lactamase (ESBL) producing Escherichia coli    Uterine cancer (HCC) 01/01/2021    Past Surgical History:  Procedure Laterality Date   BLADDER REPAIR     BREAST EXCISIONAL BIOPSY Left    CARPAL TUNNEL RELEASE Right    CATARACT EXTRACTION, BILATERAL  2014   COLONOSCOPY WITH PROPOFOL  N/A 07/18/2018   Procedure: COLONOSCOPY WITH PROPOFOL ;  Surgeon: Celestia Agent, MD;  Location: [facility] ENDOSCOPY;  Service: Endoscopy;  Laterality: N/A;   ESOPHAGOGASTRODUODENOSCOPY (EGD) WITH PROPOFOL  N/A 07/17/2018   Procedure: ESOPHAGOGASTRODUODENOSCOPY (EGD) WITH PROPOFOL ;  Surgeon: Celestia Agent, MD;  Location: [facility]  ENDOSCOPY;  Service: Endoscopy;  Laterality: N/A;   LAPAROSCOPY N/A 12/30/2020   Procedure: LAPAROSCOPY DIAGNOSTIC; WITH  BIOPSY OF ABDOMINAL WALL NODULES AND BIOPSY OF PERICOLONIC EXUDATE;  Surgeon: Kinsinger, Herlene Righter, MD;  Location: [facility]S;  Service: General;  Laterality: N/A;   PORTACATH PLACEMENT N/A 01/09/2021   Procedure: PORT INSERTION WITH [place]  & FLUORO;  Surgeon: Stevie Herlene Righter, MD;  Location: [facility];  Service: General;  Laterality: N/A;   REPAIR RECTOCELE     TOE SURGERY Left     There were no vitals filed for this visit.   Subjective Assessment - 07/23/21 1100     Subjective I had my CT scans and there is NED.  I did my first Tai Chi class yesterday and it was fatiguing. I am a little tired today, but its a good tired.    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 She is presently undergoing chemotherapy with Carboplatin  and Paclitaxel  every 3 weeeks and has 3 more cycles.  Planning complete hysterectomy for 03/24/21-03/26/2021 and likely to resume chemo on 04/13/2021 if all goes well. The back cramping is still there and doesn't last as long.    Patient Stated Goals recover from surgery.    Currently in Pain? No/denies    Pain Score 0-No pain                               [facility] Adult PT Treatment/Exercise - 07/23/21 0001       Exercises   Other Exercises  standing hip abduction, and hip flexion 1x10 yellow band, lateral band walks 3 laps 5 steps, ax beam x 6 lengths forward, and 2 laps sideways, tandem stance x 1 bilaterally with cone touches to failure, and SLS x 5 ea  with cone touches to failure,heel raises x 20, ppt x 10, bilateral piriformis stretch x2 30 sec, supine opposite arm to knee marching x 2 x 5,  SLR left 7 and 6 and right SLR 10 and 7,.bridging x10 , LTR x 5 ea                         PT Long Term Goals - 06/30/21 0940       PT LONG TERM GOAL #1   Title Pt will  be independent with HEP for generalized stregth, flexibility and balance training    Time 4    Period Weeks    Status Achieved    Target Date 06/30/21      PT LONG TERM GOAL #2   Title Pt will perform 12 sit to stands in 30 seconds for decreased risk of falls    Baseline 9 baseline, 12 today    Time 4    Period Weeks    Status Achieved    Target Date 06/30/21      PT LONG TERM GOAL #3   Title Pt will be able to maintain  bilateral SLS for greater than 10 seconds each without excessive use of arms to maintain balance    Baseline able to maintain 13 seconds on right, but not on left (3-5 sec)    Time 4    Period Weeks    Status Partially Met    Target Date 06/30/21      PT LONG TERM GOAL #4   Title Pt will report  improved heel toe gait pattern after walking short periods    Time 4    Period Weeks    Status On-going    Target Date 08/25/21      PT LONG TERM GOAL #5   Title pt will be able to go up and down stairs with minimal fatigue/SOB    Time 8    Period Weeks    Status New    Target Date 08/25/21      Additional Long Term Goals   Additional Long Term Goals Yes      PT LONG TERM GOAL #6   Title Pt will be able to stand for 7-10 minutes without undue fatigue    Time 8    Period Weeks    Status New    Target Date 08/25/21                   Plan - 07/23/21 1128     Clinical Impression Statement Pt got good news from her clear CT scan with NED. Continued standing strength, balance and supine stretching and stabilization.  Balance continues to improve but is still more limited on the left.  She was able to increase repetitions with band exercises, and requires fewer rest breaks. SLR easier on the right.still.    Personal Factors and Comorbidities Comorbidity 3+    Comorbidities s/p total hysterectomy 04/07/2021, aortic aneurysm, prior severe sepsis with acute organ failure, HBP    Examination-Activity Limitations Locomotion Level;Reach  Overhead;Bend;Squat;Dressing;Stairs    Stability/Clinical Decision Making Stable/Uncomplicated    Rehab Potential Good    PT Frequency 2x / week    PT Duration 8 weeks    PT Treatment/Interventions ADLs/Self Care Home Management;Stair training;Gait training;Therapeutic activities;Therapeutic exercise;Balance training;Neuromuscular re-education;Manual techniques;Patient/family education;Aquatic Therapy    PT Next Visit Plan I shoulder blade sore? Consider manual,balance exs, hip abd, flexor strength, core strength, rest prn.Spanx to support Left abdomen?    PT Home Exercise Plan [id number], [id number]    Consulted and Agree with Plan of Care Patient             Patient will benefit from skilled therapeutic intervention in order to improve the following deficits and impairments:  Decreased activity tolerance, Decreased balance, Decreased knowledge of precautions, Decreased endurance, Decreased range of motion, Decreased strength, Postural dysfunction, Pain, Decreased mobility  Visit Diagnosis: Muscle weakness (generalized)  Abnormal posture  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HCC)  Malignant neoplasm of abdomen 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   Coronary artery calcification 03/05/2021   Arthritis 03/04/2021   GERD (gastroesophageal reflux disease) 03/04/2021   Pneumonia 03/04/2021   Genetic testing 02/13/2021   Family history of breast cancer 01/27/2021   Family history of lung cancer 01/27/2021   Dysuria 01/26/2021   Physical deconditioning 01/10/2021   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   Dyspnea 06/19/2020    Epigastric pain 06/19/2020   Hematuria 06/19/2020   Hyperglycemia 06/19/2020   Abdominal pain 05/28/2020   Recurrent UTI 05/28/2020   Asthma    Sleep apnea    Cervical disc disease    Hepatic steatosis    History of palpitations    Hypercholesteremia    Hypertension    Lumbar disc disease    Pyelonephritis 03/30/2020   Gastrointestinal bleeding 07/25/2018   Hiatal hernia    Anemia 07/17/2018   Acute blood loss anemia 07/17/2018   Hypokalemia 07/17/2018   Gastrointestinal hemorrhage with melena 07/16/2018   Obesity, Class III, 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   Overweight 03/03/2018   Coronary artery aneurysm 03/03/2018   Chest pain with moderate risk for cardiac etiology 02/27/2018   HTN (hypertension) 02/27/2018   HLD (hyperlipidemia) 02/27/2018   Hypercholesterolemia 04/07/2017   Hypertensive disorder 04/07/2017   Pure hypercholesterolemia, unspecified 04/07/2017   Essential (primary) hypertension 04/07/2017   Palpitations 08/19/2015  PHYSICAL THERAPY DISCHARGE SUMMARY  Visits from Start of Care: 9  Current functional level related to goals / functional outcomes: Partially achieved goals   Remaining deficits: Unknown, did not return   Education / Equipment: HEP   Patient agrees to discharge. Patient goals were partially met. Patient is being discharged due to not returning since the last visit.   Grayce JINNY Sheldon, PT 07/23/2021, 11:43 AM  [facility] @ [facility] [address] [place], [place], [address] Phone: [phone]   Fax:  [phone]  Name: Brittany Middleton MRN: [id number] Date of Birth: [birth year]

## 2021-08-05 ENCOUNTER — Telehealth: Payer: Self-pay

## 2021-08-05 NOTE — Telephone Encounter (Signed)
Spoke with Brittany Middleton this afternoon and scheduled a follow up appointment with Dr. Berline Lopes. Appointment scheduled for 10/22/21 at 2:15 pm. Patient is in agreement of appointment date and time. Instructed to call with any needs.

## 2021-08-06 ENCOUNTER — Telehealth: Payer: Self-pay

## 2021-08-06 NOTE — Telephone Encounter (Signed)
Returned her call. She is trying to change port removal appt scheduled on 2/28. Given phone # for IR to her. She will call.  She sees Dr. Limmie Patricia tomorrow at [facility] and ask that the office power share her CT 1/31 to [facility]. Sent a message to radiology asking them to power share for appt tomorrow.

## 2021-08-07 ENCOUNTER — Encounter (INDEPENDENT_AMBULATORY_CARE_PROVIDER_SITE_OTHER): Payer: Self-pay

## 2021-08-17 ENCOUNTER — Other Ambulatory Visit (HOSPITAL_COMMUNITY): Payer: Medicare Other

## 2021-08-18 ENCOUNTER — Other Ambulatory Visit: Payer: Self-pay

## 2021-08-18 ENCOUNTER — Ambulatory Visit (HOSPITAL_COMMUNITY)
Admission: RE | Admit: 2021-08-18 | Discharge: 2021-08-18 | Disposition: A | Discharge status: Home / Self Care | Payer: Medicare Other | Source: Ambulatory Visit | Attending: Hematology and Oncology | Admitting: Hematology and Oncology

## 2021-08-18 DIAGNOSIS — Z452 Encounter for adjustment and management of vascular access device: Secondary | ICD-10-CM | POA: Insufficient documentation

## 2021-08-18 DIAGNOSIS — C5701 Malignant neoplasm of right fallopian tube: Secondary | ICD-10-CM | POA: Diagnosis not present

## 2021-08-18 HISTORY — PX: IR REMOVAL TUN ACCESS W/ PORT W/O FL MOD SED: IMG2290

## 2021-08-18 MED ORDER — LIDOCAINE-EPINEPHRINE (PF) 1 %-1:200000 IJ SOLN
INTRAMUSCULAR | Status: DC | PRN
  Administered 2021-08-18: 20 mL

## 2021-08-18 MED ORDER — LIDOCAINE-EPINEPHRINE 1 %-1:100000 IJ SOLN
INTRAMUSCULAR | Status: AC
  Filled 2021-08-18: qty 1

## 2021-08-18 NOTE — H&P (Signed)
Vascular and Interventional Radiology  PRE PROCEDURE H&P  Assessment   Plan:   Brittany Middleton is a 71 y.o. year old female who will undergo PORT REMOVAL in Interventional Radiology.  The procedure has been fully reviewed with the patient/patients authorized representative. The risks, benefits and alternatives have been explained, and the patient/patients authorized representative has consented to the procedure.   HPI: Brittany Middleton is a 71 y.o. year old female w PMHx significant for metastatic ovarian CA s/p neoadjuvant chemoRx w decreased disease burden on imaging. Surg Hx of R chest port placed on 01/09/21 by Dr. Volney American (Gen Surg / MIS). Pt completed therapy, recommended for port removal.  Informed consent was obtained, witnessed and placed in the patient's chart.  Allergies:  Allergies  Allergen Reactions   Macrobid [Nitrofurantoin] Shortness Of Breath and Cough    Short of breath, cough, myalgia   Ciprofloxacin Other (See Comments)    Caused PAIN IN HANDS    Codeine Nausea And Vomiting   Azithromycin Palpitations    Medications:  Current Outpatient Medications on File Prior to Encounter  Medication Sig Dispense Refill   acetaminophen (TYLENOL) 500 MG tablet Take 1,000 mg by mouth every 6 (six) hours as needed for mild pain, moderate pain, fever or headache.     albuterol (PROVENTIL) (2.5 MG/3ML) 0.083% nebulizer solution Take 2.5 mg by nebulization every 6 (six) hours as needed for wheezing or shortness of breath.     alclomethasone (ACLOVATE) 0.05 % ointment Apply 1 application topically 2 (two) times daily.     atorvastatin (LIPITOR) 20 MG tablet Take 20 mg by mouth daily.     BIOTIN PO Take 1 tablet by mouth daily.     Cholecalciferol (VITAMIN D-3) 25 MCG (1000 UT) CAPS Take 1,000 Units by mouth daily.     lidocaine-prilocaine (EMLA) cream Apply to affected area once 30 g 3   Melatonin 5 MG CHEW Chew 5 mg by mouth at bedtime.      Multiple  Vitamins-Minerals (MULTIVITAMIN WITH MINERALS) tablet Take 1 tablet by mouth daily.     pantoprazole (PROTONIX) 40 MG tablet Take 1 tablet (40 mg total) by mouth daily. 30 tablet 1   polyethylene glycol (MIRALAX / GLYCOLAX) 17 g packet Take 8.5 g by mouth 2 (two) times daily.     Current Facility-Administered Medications on File Prior to Encounter  Medication Dose Route Frequency Provider Last Rate Last Admin   heparin lock flush 100 unit/mL  500 Units Intracatheter Once [name], Ni, MD       sodium chloride flush (NS) 0.9 % injection 10 mL  10 mL Intracatheter Once [name], MD        PSH:  Past Surgical History:  Procedure Laterality Date   BLADDER REPAIR     BREAST EXCISIONAL BIOPSY Left    CARPAL TUNNEL RELEASE Right    CATARACT EXTRACTION, BILATERAL  2014   COLONOSCOPY WITH PROPOFOL N/A 07/18/2018   Procedure: COLONOSCOPY WITH PROPOFOL;  Surgeon: Laurence Spates, MD;  Location: [facility] ENDOSCOPY;  Service: Endoscopy;  Laterality: N/A;   ESOPHAGOGASTRODUODENOSCOPY (EGD) WITH PROPOFOL N/A 07/17/2018   Procedure: ESOPHAGOGASTRODUODENOSCOPY (EGD) WITH PROPOFOL;  Surgeon: Laurence Spates, MD;  Location: [place];  Service: Endoscopy;  Laterality: N/A;   LAPAROSCOPY N/A 12/30/2020   Procedure: LAPAROSCOPY DIAGNOSTIC; WITH  BIOPSY OF ABDOMINAL WALL NODULES AND BIOPSY OF PERICOLONIC EXUDATE;  Surgeon: Kinsinger, Arta Bruce, MD;  Location: [facility];  Service: General;  Laterality: N/A;   PORTACATH  PLACEMENT N/A 01/09/2021   Procedure: PORT INSERTION WITH [place] & FLUORO;  Surgeon: Kinsinger, Arta Bruce, MD;  Location: [facility];  Service: General;  Laterality: N/A;   REPAIR RECTOCELE     TOE SURGERY Left     PMH:  Past Medical History:  Diagnosis Date   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[place]) 12/30/2020   Abdominal pain 05/28/2020   Acute blood loss anemia 07/17/2018   Anemia 07/17/2018   Arthritis    Ascending aortic aneurysm 05/23/2019   Asthma    Cancer ([place])    Cervical disc disease    MRI 2016    Chest pain with moderate risk for cardiac etiology 02/27/2018   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   Coronary artery aneurysm 03/03/2018   Diplopia 01/09/2021   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 Last Assessment & Plan:  Formatting of this note might be different from the original. - continue home meds   Family history of breast cancer 01/27/2021   Family history of lung cancer 01/27/2021   Gastrointestinal bleeding 07/25/2018   Gastrointestinal hemorrhage with melena 07/16/2018   Genetic testing 02/13/2021   No pathogenic variants detected in [employer] [facility]C[employer] + MyRisk.  The report date is February 12, 2021.   The [facility] gene panel offered by [facility] includes sequencing and deletion/duplication testing of the following 48 genes: APC, ATM, AXIN2, BAP1, BARD1, BMPR1A, BRCA1, BRCA2, BRIP1, CHD1, CDK4, CDKN2A(p16 and p14ARF), CHEK2, CTNNA1, EGFR, EPCAM, FH, FLCN, GREM1, HOXB13, MEN1, M   GERD (gastroesophageal reflux disease)    Hematuria 06/19/2020   Hepatic steatosis    per imaging 2016, 2019   Hiatal hernia    History of palpitations    Holter monitor, 2017: NSR, occasional PVC, PACs, no arrhythmia   HLD (hyperlipidemia) 02/27/2018   HTN (hypertension) 02/27/2018   Hypercholesteremia    Hypercholesterolemia 04/07/2017   Hyperglycemia 06/19/2020   Hypertension    Hypertensive disorder 04/07/2017   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   Hypokalemia 07/17/2018   Lumbar disc disease    MRI, 2017    Obesity, Class III, BMI 40-49.9 (morbid obesity) ([place]) 05/22/2018   Overweight 03/03/2018   Palpitations 08/19/2015   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[place])    Peritoneal carcinomatosis ([place]) 01/01/2021   Physical debility 01/10/2021   Pneumonia    Pure hypercholesterolemia, unspecified 04/07/2017    Pyelonephritis 03/30/2020   Recurrent UTI 05/28/2020   SBO (small bowel obstruction) ([place]) 12/28/2020   Sepsis secondary to UTI ([place]) 04/25/2020   Severe sepsis ([place]) 05/21/2020   Severe sepsis with acute organ dysfunction ([place]) 04/25/2020   Skin infection 02/05/2021   Skin irritation 02/05/2021   Sleep apnea    Urinary tract infection due to extended-spectrum beta lactamase (ESBL) producing Escherichia coli    Uterine cancer ([place]) 01/01/2021    Brief Physical Examination: There were no vitals filed for this visit. General: WD, WN female in NAD HEENT: Normocephalic, atraumatic Lungs: Respirations non-labored    Michaelle Birks, MD Vascular and Interventional Radiology Specialists [facility] Radiology   Pager. [place]

## 2021-08-18 NOTE — Procedures (Signed)
Vascular and Interventional Radiology Procedure Note  Patient: Brittany Middleton DOB: Nov 23, 1950 Medical Record Number: [id number] Note Date/Time: 08/18/21 9:24 AM   Performing Physician: Michaelle Birks, MD Assistant(s): None  Diagnosis: Ovarian cancer  Procedure: PORT REMOVAL  Anesthesia: Local Anesthetic Complications: None Estimated Blood Loss:  0 mL Specimens:  None  Findings:  Successful removal of a right-sided venous port. Primary incision closure. Dermabond at skin.  See detailed procedure note with images in PACS. The patient tolerated the procedure well without incident or complication and was returned to Recovery in stable condition.    Michaelle Birks, MD Vascular and Interventional Radiology Specialists [facility] Radiology   Pager. [place]

## 2021-09-16 ENCOUNTER — Ambulatory Visit: Payer: Medicare Other | Admitting: Cardiology

## 2021-09-22 ENCOUNTER — Other Ambulatory Visit: Payer: Self-pay

## 2021-09-22 ENCOUNTER — Inpatient Hospital Stay: Payer: Medicare Other | Attending: Hematology and Oncology

## 2021-09-22 ENCOUNTER — Other Ambulatory Visit: Payer: Self-pay | Admitting: Hematology and Oncology

## 2021-09-22 ENCOUNTER — Telehealth: Payer: Self-pay

## 2021-09-22 DIAGNOSIS — C5701 Malignant neoplasm of right fallopian tube: Secondary | ICD-10-CM | POA: Diagnosis present

## 2021-09-22 NOTE — Telephone Encounter (Signed)
Scheduled pt for a lab appointment for today 09/22/21 at 1pm. Pt stated that she recently had her CBC, and CMP lab drawn at her PCP on 09/17/21. Also scheduled pt for a f/u appointment with Dr.Gorsuch for 09/23/21 @ 10:20am. Put a scheduling note for lab to only draw a CA 125 for today.  ?

## 2021-09-22 NOTE — Telephone Encounter (Signed)
Tell her to come in today for labs ?I can see her after the last patient tomorrow morning ?No CT scan yet until I see her ?

## 2021-09-22 NOTE — Telephone Encounter (Signed)
She called and left a long message. For the last week on the left side of her belly button she has a hard area that feels like a ball. She area is uncomfortable. She is asking for a earlier appt with Dr. Berline Lopes and appt with Dr. [name] to discuss her options. She is also requesting a CT scan and CA-125. ?

## 2021-09-22 NOTE — Telephone Encounter (Signed)
Attempted to speak with pt to sch lab and f/u appointment with Dr. [name]. Unable to reach pt. LVM for return call.  ?

## 2021-09-23 ENCOUNTER — Other Ambulatory Visit: Payer: Self-pay | Admitting: Internal Medicine

## 2021-09-23 ENCOUNTER — Ambulatory Visit (HOSPITAL_COMMUNITY)
Admission: RE | Admit: 2021-09-23 | Discharge: 2021-09-23 | Disposition: A | Discharge status: Home / Self Care | Payer: Medicare Other | Source: Ambulatory Visit | Attending: Hematology and Oncology | Admitting: Hematology and Oncology

## 2021-09-23 ENCOUNTER — Inpatient Hospital Stay (HOSPITAL_BASED_OUTPATIENT_CLINIC_OR_DEPARTMENT_OTHER): Payer: Medicare Other | Admitting: Hematology and Oncology

## 2021-09-23 ENCOUNTER — Encounter: Payer: Self-pay | Admitting: Hematology and Oncology

## 2021-09-23 VITALS — BP 145/67 | HR 66 | Temp 99.0°F | Resp 18 | Ht 65.0 in | Wt 231.4 lb

## 2021-09-23 DIAGNOSIS — Z1231 Encounter for screening mammogram for malignant neoplasm of breast: Secondary | ICD-10-CM

## 2021-09-23 DIAGNOSIS — R140 Abdominal distension (gaseous): Secondary | ICD-10-CM

## 2021-09-23 DIAGNOSIS — D61818 Other pancytopenia: Secondary | ICD-10-CM

## 2021-09-23 DIAGNOSIS — C5701 Malignant neoplasm of right fallopian tube: Secondary | ICD-10-CM | POA: Diagnosis present

## 2021-09-23 LAB — CA 125: Cancer Antigen (CA) 125: 3.7 U/mL (ref 0.0–38.1)

## 2021-09-23 NOTE — Assessment & Plan Note (Signed)
Her examination is benign but difficult to rule out malignancy due to obesity ?As above, I will call her with test results ?She might need imaging study if test results are unrevealing ?

## 2021-09-23 NOTE — Assessment & Plan Note (Signed)
She has significant concerns that her symptoms could be due to cancer recurrence ?Tumor marker is pending ?I will order abdominal x-ray today ?I will call her with results of both ?If the test results are unrevealing, I will proceed to order CT imaging of the abdomen and pelvis for evaluation ?

## 2021-09-23 NOTE — Progress Notes (Signed)
[facility] ?OFFICE PROGRESS NOTE ? ?Patient Care Team: ?[name], MD as PCP - General (Internal Medicine) ?Revankar, [name], MD as PCP - Cardiology (Cardiology) ?[name], MD as Consulting Physician (Cardiology) ?Awanda Mink Craige Cotta, RN as Oncology Nurse Navigator (Oncology) ? 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 ?Pancytopenia, acquired ([place]) ?On review on her outside blood work, she has persistent pancytopenia ?She has borderline iron deficiency ?Overall, I felt that her pancytopenia will likely improve in time ?Observe closely for now ? ?Abdominal bloating ?Her examination is benign but difficult to rule out malignancy due to obesity ?As above, I will call her with test results ?She might need imaging study if test results are unrevealing ? ?Orders Placed This Encounter  ?Procedures  ? DG Abd 2 Views  ?  Standing Status:   Future  ?  Number of Occurrences:   1  ?  Standing Expiration Date:   09/24/2022  ?  Order Specific Question:   Reason for exam:  ?  Answer:   abdominal bloating, Hx fallopian tube ca, r/o  cancer recurrence  ?  Order Specific Question:   Preferred imaging location?  ?  Answer:   [facility]  ? ? ?All questions were answered. The patient knows to call the clinic with any problems, questions or concerns. ?The total time spent in the appointment was 30 minutes encounter with patients including review of chart and various tests results, discussions about plan of care and coordination of care plan ?  ?[name], MD ?09/23/2021 1:35 PM ? ?INTERVAL HISTORY: ?Please see below for problem oriented charting. ?she returns for urgent  evaluation ?She started to notice postprandial abdominal distention and sensation of bloating recently ?She has no changes in bowel habits ?Her abdominal wound is well-healed ?She has concern over the left upper quadrant usually within minutes of eating ?No nausea vomiting ?Denies abnormal weight loss ?She had recent blood work done, results reviewed ?She has mild persistent pancytopenia but not significantly changed compared to her prior blood work ? ?REVIEW OF SYSTEMS:   ?Constitutional: Denies fevers, chills or abnormal weight loss ?Eyes: Denies blurriness of vision ?Ears, nose, mouth, throat, and face: Denies mucositis or sore throat ?Respiratory: Denies cough, dyspnea or wheezes ?Cardiovascular: Denies palpitation, chest discomfort or lower extremity swelling ?Gastrointestinal:  Denies nausea, heartburn or change in bowel habits ?Skin: Denies abnormal skin rashes ?Lymphatics: Denies new lymphadenopathy or easy bruising ?Neurological:Denies numbness, tingling or new weaknesses ?Behavioral/Psych: Mood is stable, no new changes  ?All other systems were reviewed with the patient and are negative. ? ?I have reviewed the past medical history, past surgical history, social history and family history with the patient and they are unchanged from previous note. ? ?ALLERGIES:  is allergic to macrobid [nitrofurantoin], ciprofloxacin, codeine, and azithromycin. ? ?MEDICATIONS:  ?Current Outpatient Medications  ?Medication Sig Dispense Refill  ? acetaminophen (TYLENOL) 500 MG tablet Take 1,000 mg by mouth every 6 (six) hours as needed for mild pain, moderate pain, fever or headache.    ? albuterol (PROVENTIL) (2.5 MG/3ML) 0.083% nebulizer solution Take 2.5 mg by nebulization every 6 (six) hours as needed for wheezing or shortness of  breath.    ? alclomethasone (ACLOVATE) 0.05 % ointment Apply 1 application topically 2 (two) times daily.    ? atorvastatin (LIPITOR) 20 MG tablet Take 20 mg by mouth daily.    ? BIOTIN PO Take 1  tablet by mouth daily.    ? Cholecalciferol (VITAMIN D-3) 25 MCG (1000 UT) CAPS Take 1,000 Units by mouth daily.    ? lidocaine-prilocaine (EMLA) cream Apply to affected area once 30 g 3  ? Melatonin 5 MG CHEW Chew 5 mg by mouth at bedtime.     ? Multiple Vitamins-Minerals (MULTIVITAMIN WITH MINERALS) tablet Take 1 tablet by mouth daily.    ? pantoprazole (PROTONIX) 40 MG tablet Take 1 tablet (40 mg total) by mouth daily. 30 tablet 1  ? polyethylene glycol (MIRALAX / GLYCOLAX) 17 g packet Take 8.5 g by mouth 2 (two) times daily.    ? ?No current facility-administered medications for this visit.  ? ?Facility-Administered Medications Ordered in Other Visits  ?Medication Dose Route Frequency Provider Last Rate Last Admin  ? heparin lock flush 100 unit/mL  500 Units Intracatheter Once [name], Golda Zavalza, MD      ? sodium chloride flush (NS) 0.9 % injection 10 mL  10 mL Intracatheter Once [name], MD      ? ? ?SUMMARY OF ONCOLOGIC HISTORY: ?Oncology History Overview Note  ?BRCA1/2 negative testing ?High grade serous, near complete pathological response to neoadjuvant chemo ?  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  ?01/12/2021 - 03/11/2021 Chemotherapy  ? Patient is on Treatment Plan : OVARIAN Carboplatin (AUC 6) / Paclitaxel (175) q21d x 6 cycles  ?   ?Adenocarcinoma of right fallopian tube [facility])  ?07/18/2018 Procedure  ? Colonoscopy ?- Non-bleeding internal hemorrhoids. ?- Diverticulosis in the sigmoid colon and in the descending colon. ?- No specimens collected. ?- Blood in stool without cause found on endoscopic exam ?  ?05/08/2020 Imaging  ? 1. Bladder is decompressed though demonstrates significant perivesicular hazy stranding, mucosal hyperemia and edematous mural thickening. Findings are suggestive of cystitis. Correlate with urinalysis. No abnormal perinephric or periureteral stranding or other features to suggest an ascending tract infection at  this time. ?2. Colonic diverticulosis without evidence of acute diverticulitis. ?3. Aortic Atherosclerosis (ICD10-I70.0). ?  ?12/28/2020 Imaging  ? 1. 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 Differential for peritoneal carcinomatosis is primarily neoplastic and includes cancers of the ovary, appendix, colon, pancreas and stomach. PET-CT may be helpful for further characterization. Tissue sampling of the mesentery may be eventually required for diagnosis. ?2. Mildly distended small bowel loops throughout the abdomen and pelvis, with fluid and associated air-fluid levels throughout the nondistended small bowel, suggesting a mild ileus versus partial small bowel obstruction. Favor partial small bowel obstruction with transition zone in the RIGHT lower quadrant, likely related to aforementioned neoplastic peritoneal implants and/or adhesions. ?3. Small free fluid within the RIGHT upper quadrant and LEFT upper quadrant. No abscess collection seen. No free intraperitoneal air seen. ?4. Extensive colonic diverticulosis without evidence of acute diverticulitis. ?5. Small chronic pleural effusions with associated atelectasis. ?  ?12/29/2020 Tumor Marker  ? Patient's tumor was tested for the following markers: CA-125. ?Results of the tumor marker test revealed 95.1. ?  ?12/30/2020 Surgery  ? Preoperative diagnosis: abdominal nodules ?  ?Postoperative diagnosis: same ?  ?Procedure: diagnostic laparoscopy with abdominal wall biopsy ?  ?  Surgeon: Gurney Maxin, M.D. ?   ?Indications for procedure: Yazleemar Strassner is a 71 y.o. year old female with symptoms of nausea, vomiting, and abdominal pain. Work up was concerning for cancer with abdominal wall studding. ?  ?Description of procedure: The patient was brought into the operative suite. Anesthesia was administered with  General endotracheal anesthesia. WHO checklist was applied. The patient was then placed in supine position. The area was prepped and draped in the usual sterile fashion. ?  ?A small left subcostal incision was made

## 2021-09-23 NOTE — Assessment & Plan Note (Signed)
On review on her outside blood work, she has persistent pancytopenia ?She has borderline iron deficiency ?Overall, I felt that her pancytopenia will likely improve in time ?Observe closely for now ?

## 2021-09-24 ENCOUNTER — Encounter (HOSPITAL_COMMUNITY): Payer: Self-pay

## 2021-09-24 ENCOUNTER — Other Ambulatory Visit: Payer: Self-pay | Admitting: Hematology and Oncology

## 2021-09-24 ENCOUNTER — Telehealth: Payer: Self-pay

## 2021-09-24 DIAGNOSIS — C5701 Malignant neoplasm of right fallopian tube: Secondary | ICD-10-CM

## 2021-09-24 NOTE — Telephone Encounter (Signed)
Called and given message from Dr. [name], CA-125 and X ray are normal  ?Please ask if she felt she wants CT or wait until her next visit? ?She verbalized understanding. She would like a CT scan prior to seeing Dr. Berline Lopes on 5/4 if possible. ?She appreciated the call. ? ?

## 2021-09-24 NOTE — Telephone Encounter (Signed)
Called and left a message with radiology scheduling # to call and schedule CT on 5/2. Lab appt scheduled 5/2 at 9 am, ask to schedule the CT after lab appt. Ask her to call the office back with questions. ?

## 2021-10-13 ENCOUNTER — Telehealth: Payer: Self-pay

## 2021-10-13 NOTE — Telephone Encounter (Signed)
Returned her call. She is wanting to get CT scan earlier due to having bowel issues. Loose bm's and then constipation. She is taking laxatives. Given radiology # to call. Ask her to call the office back if she is able to get a earlier appt. ?

## 2021-10-13 NOTE — Telephone Encounter (Signed)
She called back and left a message. She is going to leave appts as scheduled. She is unable to get a earlier CT appt. ?Denies pain and bloating. She will continue laxatives as needed for constipation. ?

## 2021-10-15 ENCOUNTER — Telehealth: Payer: Self-pay | Admitting: Oncology

## 2021-10-15 ENCOUNTER — Ambulatory Visit
Admission: RE | Admit: 2021-10-15 | Discharge: 2021-10-15 | Disposition: A | Discharge status: Home / Self Care | Payer: Self-pay | Source: Ambulatory Visit | Attending: Gynecologic Oncology | Admitting: Gynecologic Oncology

## 2021-10-15 ENCOUNTER — Encounter: Payer: Self-pay | Admitting: Gynecologic Oncology

## 2021-10-15 ENCOUNTER — Other Ambulatory Visit: Payer: Self-pay | Admitting: Oncology

## 2021-10-15 ENCOUNTER — Telehealth: Payer: Self-pay | Admitting: *Deleted

## 2021-10-15 DIAGNOSIS — C5701 Malignant neoplasm of right fallopian tube: Secondary | ICD-10-CM

## 2021-10-15 NOTE — Telephone Encounter (Signed)
No need repeat labs ?

## 2021-10-15 NOTE — Telephone Encounter (Signed)
Called Brittany Middleton back and advised her that she does not need repeat labs on 10/20/21 so the lab appointment will be canceled..  She verbalized understanding and agreement. ?

## 2021-10-15 NOTE — Telephone Encounter (Signed)
Called Brittany Middleton and advised her we are working on getting her CT scan from [facility] powershared to [place]. Also that we can cancel the CT scheduled for 10/20/21 since the CT from [facility] was normal.  She verbalized agreement and then asked if she still needs labs on 10/20/21.  She is wondering if she needs a CA 125 drawn and if she can have a vitamin D level checked.  ?

## 2021-10-15 NOTE — Telephone Encounter (Signed)
Spoke with pt today to complete her meaningful use. Pt stated that she was recently in the emergency department at [facility] due to constipation. While there she got an abdomen and pelvis CT. She stated it just showed constipation. She has increased her dose of miralax and senna and was prescribed Reglan. She has requested for moses to be able to see the imagining done at [facility].  Joylene John, NP and Santiago Glad, RN made aware. Pt denies pain, nausea, vomiting or abdomen distention. She is wondering if she still needs the CT done at [facility]. She stated she has had 2-3 small bowel movements since leaving the emergency room. Informed pt if symptoms gets worse or she starts experiencing pain, nauseas or vomiting to give [place] a call. She verbalized understanding.  ?

## 2021-10-15 NOTE — Progress Notes (Signed)
Outside order placed for CT abd/pelvis done at [facility] on 10/14/21.  Tedra Coupe in Radiology notified to [facility] from [place]. ?

## 2021-10-19 ENCOUNTER — Ambulatory Visit
Admission: RE | Admit: 2021-10-19 | Discharge: 2021-10-19 | Disposition: A | Discharge status: Home / Self Care | Payer: Medicare Other | Source: Ambulatory Visit

## 2021-10-19 DIAGNOSIS — Z1231 Encounter for screening mammogram for malignant neoplasm of breast: Secondary | ICD-10-CM

## 2021-10-20 ENCOUNTER — Other Ambulatory Visit: Payer: Medicare Other

## 2021-10-20 ENCOUNTER — Ambulatory Visit (HOSPITAL_COMMUNITY): Payer: Medicare Other

## 2021-10-20 ENCOUNTER — Inpatient Hospital Stay: Payer: Medicare Other

## 2021-10-20 ENCOUNTER — Telehealth: Payer: Self-pay

## 2021-10-20 NOTE — Telephone Encounter (Signed)
Returned her call. She is asking if Dr. [name] will order Vitamin D and CA-125 prior to 5/4 appt with Dr. Berline Lopes? ?Ok to add lab appt? ?

## 2021-10-22 ENCOUNTER — Other Ambulatory Visit: Payer: Self-pay

## 2021-10-22 ENCOUNTER — Encounter: Payer: Self-pay | Admitting: Hematology and Oncology

## 2021-10-22 ENCOUNTER — Encounter: Payer: Self-pay | Admitting: Gynecologic Oncology

## 2021-10-22 ENCOUNTER — Inpatient Hospital Stay: Payer: Medicare Other | Attending: Hematology and Oncology | Admitting: Gynecologic Oncology

## 2021-10-22 VITALS — BP 138/68 | HR 59 | Temp 97.5°F | Resp 16 | Ht 65.0 in | Wt 225.0 lb

## 2021-10-22 DIAGNOSIS — Z90722 Acquired absence of ovaries, bilateral: Secondary | ICD-10-CM | POA: Diagnosis not present

## 2021-10-22 DIAGNOSIS — Z90710 Acquired absence of both cervix and uterus: Secondary | ICD-10-CM | POA: Insufficient documentation

## 2021-10-22 DIAGNOSIS — Z9221 Personal history of antineoplastic chemotherapy: Secondary | ICD-10-CM | POA: Diagnosis not present

## 2021-10-22 DIAGNOSIS — Z8544 Personal history of malignant neoplasm of other female genital organs: Secondary | ICD-10-CM | POA: Diagnosis present

## 2021-10-22 DIAGNOSIS — C5701 Malignant neoplasm of right fallopian tube: Secondary | ICD-10-CM

## 2021-10-22 DIAGNOSIS — T451X5A Adverse effect of antineoplastic and immunosuppressive drugs, initial encounter: Secondary | ICD-10-CM

## 2021-10-22 DIAGNOSIS — E663 Overweight: Secondary | ICD-10-CM

## 2021-10-22 NOTE — Telephone Encounter (Signed)
Called and given below message. She verbalized understanding. 

## 2021-10-22 NOTE — Telephone Encounter (Signed)
She is seeing Dr. Berline Lopes today ?Typically, I do not order CA-125 every month ?She can get vitamin D level checked in her next blood draw ?

## 2021-10-22 NOTE — Patient Instructions (Signed)
It was good to see you today.  I do not see or feel any evidence of cancer recurrence on your exam. ? ?I will plan to see you in 6 months for follow-up.  My schedule is not out past the fall.  Please call back sometime in September to get a visit scheduled with me in early November. ? ?As always, if you have any change in your symptoms or new concerning symptoms, please call to see either myself or Dr. [name] sooner than your next scheduled follow-up visit. ?

## 2021-10-22 NOTE — Progress Notes (Signed)
Gynecologic Oncology Return Clinic Visit ? ?10/22/2021 ? ?Reason for Visit: Surveillance in the setting of ovarian cancer ? ?Treatment History: ?Oncology History Overview Note  ?BRCA1/2 negative testing ?High grade serous, near complete pathological response to neoadjuvant chemo ?  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 ?  ?01/12/2021 - 03/11/2021 Chemotherapy  ? Patient is on Treatment Plan : OVARIAN Carboplatin (AUC 6) / Paclitaxel (175) q21d x 6 cycles  ? ?  ?  ?Adenocarcinoma of right fallopian tube [facility])  ?07/18/2018 Procedure  ? Colonoscopy ?- Non-bleeding internal hemorrhoids. ?- Diverticulosis in the sigmoid colon and in the descending colon. ?- No specimens collected. ?- Blood in stool without cause found on endoscopic exam ?  ?05/08/2020 Imaging  ? 1. Bladder is decompressed though demonstrates significant perivesicular hazy stranding, mucosal hyperemia and edematous mural thickening. Findings are suggestive of cystitis. Correlate with urinalysis. No abnormal perinephric or periureteral stranding or other features to suggest an ascending tract infection at this time. ?2. Colonic diverticulosis without evidence of acute diverticulitis. ?3. Aortic Atherosclerosis (ICD10-I70.0). ?  ?12/28/2020 Imaging  ? 1. 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 Differential for peritoneal carcinomatosis is primarily neoplastic and includes cancers of the ovary, appendix, colon, pancreas and stomach. PET-CT may be helpful for further characterization. Tissue sampling of the mesentery may be eventually required for diagnosis. ?2. Mildly distended small bowel loops throughout the abdomen and pelvis, with fluid and associated air-fluid levels throughout the nondistended small bowel, suggesting a mild ileus  versus partial small bowel obstruction. Favor partial small bowel obstruction with transition zone in the RIGHT lower quadrant, likely related to aforementioned neoplastic peritoneal implants and/or adhesions. ?3. Small free fluid within the RIGHT upper quadrant and LEFT upper quadrant. No abscess collection seen. No free intraperitoneal air seen. ?4. Extensive colonic diverticulosis without evidence of acute diverticulitis. ?5. Small chronic pleural effusions with associated atelectasis. ?  ?12/29/2020 Tumor Marker  ? Patient's tumor was tested for the following markers: CA-125. ?Results of the tumor marker test revealed 95.1. ?  ?12/30/2020 Surgery  ? Preoperative diagnosis: abdominal nodules ?  ?Postoperative diagnosis: same ?  ?Procedure: diagnostic laparoscopy with abdominal wall biopsy ?  ?Surgeon: Gurney Maxin, M.D. ?   ?Indications for procedure: Sha Burling is a 71 y.o. year old female with symptoms of nausea, vomiting, and abdominal pain. Work up was concerning for cancer with abdominal wall studding. ?  ?Description of procedure: The patient was brought into the operative suite. Anesthesia was administered with General endotracheal anesthesia. WHO checklist was applied. The patient was then placed in supine position. The area was prepped and draped in the usual sterile fashion. ?  ?A small left subcostal incision was made. A 78m trocar was used to gain access to the peritoneal cavity by optical entry technique. Pneumoperitoneum was applied with a high flow and low pressure. The laparoscope was reinserted to confirm position. ?  ?On initial visualization of the abdomen, there were multiple nodules throughout the abdominal wall. There were multiple nodules over the small intestine and mesentery. There was some loops of small intestine that were matted together. There was a large white mass in the left upper quadrant. Multiple areas of peritoneum were removed with harmonic scalpel. The large mass was  inspected. It appeared to be related to the colon and omentum. Grasper was used to remove some  superficial tissue and was sent for culture. ?  ?The abdomen was desufflated. The incisions were closed with 4-0 monocryl subcuticular stitch. ?  ?12/30/2020 Pathology Results  ? FINAL MICROSCOPIC DIAGNOSIS:  ? ?A. ABDOMINAL WALL, NODULE, EXCISION:  ?-  Metastatic adenocarcinoma  ?-  See comment  ? ?B. PERI COLONIC EXUDATE, EXCISION:  ?-  Metastatic adenocarcinoma  ?-  See comment  ? ?COMMENT:  ? ?By immunohistochemistry, the neoplastic cells are positive for cytokeratin 7, PAX8 and WT1 but negative for cytokeratin 20, CDX2, p53 and TTF-1.  The morphology and immunophenotype are consistent with a gynecologic primary.   ?  ?01/01/2021 Initial Diagnosis  ? Peritoneal carcinomatosis ([place]) ? ?  ?01/08/2021 Cancer Staging  ? Staging form: Ovary, Fallopian Tube, and Primary Peritoneal Carcinoma, AJCC 8th Edition ?- Clinical stage from 01/08/2021: cT3, cN0, cM0 - Signed by [name], MD on 01/08/2021 ?Stage prefix: Initial diagnosis ? ?  ?01/12/2021 - 03/11/2021 Chemotherapy  ? Patient is on Treatment Plan : OVARIAN Carboplatin (AUC 6) / Paclitaxel (175) q21d x 6 cycles  ? ?  ?  ?01/17/2021 Imaging  ? MRI brain ?No evidence of intracranial metastatic disease or other acute abnormality ?  ?02/12/2021 Genetic Testing  ? No pathogenic variants detected in [facility].  The report date is February 12, 2021.  ? ?The [facility] gene panel offered by [employer] includes sequencing and deletion/duplication testing of the following 48 genes: APC, ATM, AXIN2, BAP1, BARD1, BMPR1A, BRCA1, BRCA2, BRIP1, CHD1, CDK4, CDKN2A(p16 and p14ARF), CHEK2, CTNNA1, EGFR, EPCAM, FH, FLCN, GREM1, HOXB13, MEN1, MET, MITF , MLH1, MSH2, MSH3, MSH6, MUTYH, NTHL1, PALB2, PMS2, POLD1, POLE, PTEN, RAD51C, RAD51D, RET, SDHA, SDHB, SDHC, SDHD, SMAD4, STK11,TERT, TP53, TSC1, TSC2, and VHL.  ?Unless otherwise specified, all coding regions and  flanking non-coding regions are analyzed for sequence variation. Analysis of flanking intronic regions typically do not extend more than 20 bp before and 10 bp after each exon, though the exact region may be adjusted based on the presence of either potentially significant variants or highly repetitive sequences. Coding regions and proximal promoter regions near the transcription start sites are analyzed for large deletions or duplications. Specific genes are tested only for sequence and/or CNVs within limited regions. Limited clinically relevant regions are included for EGFR (sequencing and CNV analysis of exons 18-21),RET (sequencing and CNV analysis of exons 5, 8, 10, 11, and 13-16), and MITF (sequencing of position c.952). Only CNV analysis of the last two exons of EPCAM is performed. CNV analysis of GREM1 includes the upstream region overlapping the adjacent gene SCG5. MSH3 exon 1 contains a long polyalanine repeat that can interfere with variant calling; therefore, MSH3 analysis excludes c.121 to c.237. Only sequence analysis of the exons encompassing the exonuclease domains of these genes is performed (POLD1 c.841 to c.1686, POLE c.802 to c.1473). Limited promoter regions in selected genes undergo sequence analysis including TERT (c.-71 to c.-1), and APC Promoter 1B (c.-195 to c.-190 and c.-125 ([id number]).  ? ?HRD testing pending.  ?  ?03/02/2021 Imaging  ? Outside imaging at [facility] ?Slighty improved peritoneal carcinomatosis. No evidence of new metastatic disease or acute abdominal pathology.  ?  ?03/02/2021 Imaging  ? Outside CT imaging done at [facility] ? ?Slighty improved peritoneal carcinomatosis. No evidence of new metastatic disease or acute abdominal pathology ?  ?03/02/2021 - 03/03/2021 Hospital Admission  ? She was hospitalized briefly for evaluation of hematochezia at [facility].  She underwent CT imaging and colonoscopy and was subsequently  discharged ?  ?03/03/2021 Procedure  ? Colonoscopy was performed at  [facility] ? ?Findings: Diverticula were found in the entire colon. ?Internal hemorrhoids were found during retroflexion.  ?The hemorrhoids were Grade I (internal hemorrhoids that do not prolapse). ?Impression: -

## 2021-11-04 ENCOUNTER — Ambulatory Visit (INDEPENDENT_AMBULATORY_CARE_PROVIDER_SITE_OTHER): Payer: Medicare Other | Admitting: Pulmonary Disease

## 2021-11-04 ENCOUNTER — Encounter: Payer: Self-pay | Admitting: Pulmonary Disease

## 2021-11-04 ENCOUNTER — Other Ambulatory Visit: Payer: Self-pay

## 2021-11-04 VITALS — BP 118/76 | HR 64 | Ht 65.0 in | Wt 225.0 lb

## 2021-11-04 DIAGNOSIS — J4520 Mild intermittent asthma, uncomplicated: Secondary | ICD-10-CM | POA: Diagnosis not present

## 2021-11-04 MED ORDER — FLUTICASONE PROPIONATE HFA 110 MCG/ACT IN AERO: 2.0000 | INHALATION_SPRAY | Freq: Two times a day (BID) | RESPIRATORY_TRACT | 12 refills | Status: DC | PRN

## 2021-11-04 MED ORDER — ALBUTEROL SULFATE HFA 108 (90 BASE) MCG/ACT IN AERS: 2.0000 | INHALATION_SPRAY | Freq: Four times a day (QID) | RESPIRATORY_TRACT | 6 refills | Status: DC | PRN

## 2021-11-04 NOTE — Progress Notes (Signed)
Synopsis: Hospital follow up for dyspnea, cough and OSA.  Subjective:   PATIENT ID: Brittany Middleton GENDER: female DOB: [birth year], MRN: [id number]  HPI  Chief Complaint  Patient presents with   Follow-up    35yrf/u for asthma. States her breathing has been stable since last visit. Wants to discuss Flovent. Currently using a cpap machine.    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   She has noted increased usage of her albuterol inhaler due to spring allergies.  She has run out of her flovent inhaler.   She is using her CPAP machine nightly 4-8cmH2O auto titrating. She was able to get a new machine.   Overall she is feeling well since completing all of her ovarian cancer treatments. She has requested CT Chest scans for surveillance of her disease as she knows it can spread to her lungs.   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 Last Assessment & Plan:  Formatting of this note might be different from the original. - continue home meds   Family history of breast cancer 01/27/2021   Family  history of lung cancer 01/27/2021   Gastrointestinal bleeding 07/25/2018   Gastrointestinal hemorrhage with melena 07/16/2018   Genetic testing 02/13/2021   No pathogenic variants detected in [facility].  The report date is February 12, 2021.   [facility] and deletion/duplication testing of the following 48 genes: APC, ATM, AXIN2, BAP1, BARD1, BMPR1A, BRCA1, BRCA2, BRIP1, CHD1, CDK4, CDKN2A(p16 and p14ARF), CHEK2, CTNNA1, EGFR, EPCAM, FH, FLCN, GREM1, HOXB13, MEN1, M   GERD (gastroesophageal reflux disease)    Hematuria 06/19/2020   Hepatic steatosis    per imaging 2016, 2019   Hiatal hernia    History of palpitations    Holter monitor, 2017: NSR, occasional PVC, PACs, no arrhythmia   HLD (hyperlipidemia) 02/27/2018   HTN (hypertension) 02/27/2018   Hypercholesteremia    Hypercholesterolemia 04/07/2017   Hyperglycemia 06/19/2020   Hypertension    Hypertensive disorder 04/07/2017   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   Hypokalemia 07/17/2018   Lumbar disc disease    MRI, 2017    Obesity, Class III, BMI 40-49.9 (morbid obesity) ([facility] 05/22/2018   Overweight 03/03/2018   Palpitations 08/19/2015   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    Peritoneal carcinomatosis ([facility] 01/01/2021   Physical debility 01/10/2021   Pneumonia    Pure hypercholesterolemia, unspecified 04/07/2017   Pyelonephritis 03/30/2020   Recurrent UTI 05/28/2020   S[place] ([place] obstruction) [place] 12/28/2020   Sepsis secondary to UTI ([facility] 04/25/2020  Severe sepsis ([place]) 05/21/2020   Severe sepsis with acute organ dysfunction ([place]) 04/25/2020   Skin infection 02/05/2021   Skin irritation 02/05/2021   Sleep apnea    Urinary tract infection due to extended-spectrum beta lactamase (ESBL) producing Escherichia coli    Uterine cancer ([place]) 01/01/2021     Family History  Problem Relation Age of Onset   CAD Mother    Breast cancer Mother 47   Lung  cancer Father 58   Cancer Paternal 5        GYN cancer? bladder? d. early 19s   Cancer Paternal Aunt 76       GYN cancer   Lung cancer Paternal Grandfather        d. 28; smoking hx     Social History   Socioeconomic History   Marital status: Divorced    Spouse name: Not on file   Number of children: 2   Years of education: Not on file   Highest education level: Master's degree (e.g., MA, MS, MEng, MEd, MSW, MBA)  Occupational History   Occupation: Retired  Tobacco Use   Smoking status: Never   Smokeless tobacco: Never  Vaping Use   Vaping Use: Never used  Substance and Sexual Activity   Alcohol use: No   Drug use: No   Sexual activity: Not on file  Other Topics Concern   Not on file  Social History Narrative   Not on file   Social Determinants of Health   Financial Resource Strain: Not on file  Food Insecurity: Not on file  Transportation Needs: Not on file  Physical Activity: Not on file  Stress: Not on file  Social Connections: Not on file  Intimate Partner Violence: Not on file     Allergies  Allergen Reactions   Macrobid [Nitrofurantoin] Shortness Of Breath and Cough    Short of breath, cough, myalgia   Ciprofloxacin Other (See Comments)    Caused PAIN IN HANDS    Codeine Nausea And Vomiting   Azithromycin Palpitations     Outpatient Medications Prior to Visit  Medication Sig Dispense Refill   acetaminophen (TYLENOL) 500 MG tablet Take 1,000 mg by mouth every 6 (six) hours as needed for mild pain, moderate pain, fever or headache.     albuterol (PROVENTIL) (2.5 MG/3ML) 0.083% nebulizer solution Take 2.5 mg by nebulization every 6 (six) hours as needed for wheezing or shortness of breath.     Ascorbic Acid (VITAMIN C PO) Take by mouth.     atorvastatin (LIPITOR) 20 MG tablet Take 20 mg by mouth daily.     BIOTIN PO Take 1 tablet by mouth daily.     Cholecalciferol (VITAMIN D-3) 25 MCG (1000 UT) CAPS Take 1,000 Units by mouth daily.     co-enzyme  Q-10 30 MG capsule Take by mouth.     hydrocortisone 2.5 % cream Apply topically.     loratadine (CLARITIN) 10 MG tablet Take 10 mg by mouth daily.     losartan (COZAAR) 50 MG tablet Take 50 mg by mouth daily.     Melatonin 5 MG CHEW Chew 5 mg by mouth at bedtime.      metFORMIN (GLUCOPHAGE) 500 MG tablet Take 500 mg by mouth 2 (two) times daily.     Multiple Vitamins-Minerals (MULTIVITAMIN WITH MINERALS) tablet Take 1 tablet by mouth daily.     pantoprazole (PROTONIX) 40 MG tablet Take 1 tablet (40 mg total) by mouth daily. 30 tablet 1   polyethylene  glycol (MIRALAX / GLYCOLAX) 17 g packet Take 8.5 g by mouth 2 (two) times daily.     Probiotic Product (PROBIOTIC BLEND PO) Take by mouth.     sennosides-docusate sodium (SENOKOT-S) 8.6-50 MG tablet Take 1 tablet by mouth 2 (two) times daily as needed for constipation.     alclomethasone (ACLOVATE) 0.05 % ointment Apply 1 application topically 2 (two) times daily.     metoCLOPramide (REGLAN) 10 MG tablet Take 10 mg by mouth 2 (two) times daily.     sucralfate (CARAFATE) 1 g tablet 1 tablet on an empty stomach     Facility-Administered Medications Prior to Visit  Medication Dose Route Frequency Provider Last Rate Last Admin   heparin lock flush 100 unit/mL  500 Units Intracatheter Once [name], Ni, MD       sodium chloride flush (NS) 0.9 % injection 10 mL  10 mL Intracatheter Once [name], MD        Review of Systems  Constitutional:  Negative for chills, fever, malaise/fatigue and weight loss.  HENT:  Negative for sinus pain and sore throat.   Eyes: Negative.   Respiratory:  Positive for shortness of breath and wheezing. Negative for cough.   Cardiovascular:  Negative for chest pain and leg swelling.  Gastrointestinal:  Negative for abdominal pain, heartburn, nausea and vomiting.  Musculoskeletal: Negative.   Neurological:  Negative for dizziness, weakness and headaches.  Endo/Heme/Allergies: Negative.   Psychiatric/Behavioral:  Negative.     Objective:   Vitals:   11/04/21 1348  BP: 118/76  Pulse: 64  SpO2: 96%  Weight: 225 lb (102.1 kg)  Height: '5\' 5"'  (1.651 m)   Physical Exam Constitutional:      General: She is not in acute distress.    Appearance: She is obese. She is not ill-appearing.  HENT:     Head: Normocephalic and atraumatic.  Eyes:     General: No scleral icterus.    Conjunctiva/sclera: Conjunctivae normal.  Cardiovascular:     Rate and Rhythm: Normal rate and regular rhythm.     Pulses: Normal pulses.     Heart sounds: Normal heart sounds. No murmur heard. Pulmonary:     Effort: Pulmonary effort is normal.     Breath sounds: No wheezing, rhonchi or rales.  Musculoskeletal:     Right lower leg: No edema.     Left lower leg: No edema.  Skin:    General: Skin is warm and dry.     Capillary Refill: Capillary refill takes less than 2 seconds.  Neurological:     General: No focal deficit present.     Mental Status: She is alert.     Gait: Gait normal.  Psychiatric:        Mood and Affect: Mood normal.        Behavior: Behavior normal.        Thought Content: Thought content normal.        Judgment: Judgment normal.    CBC    Component Value Date/Time   WBC 2.6 (L) 07/21/2021 0828   WBC 4.7 02/03/2021 1708   RBC 3.45 (L) 07/21/2021 0828   HGB 10.2 (L) 07/21/2021 0828   HGB 13.9 12/20/2019 0829   HCT 32.0 (L) 07/21/2021 0828   HCT 42.5 12/20/2019 0829   PLT 187 07/21/2021 0828   PLT 207 12/20/2019 0829   MCV 92.8 07/21/2021 0828   MCV 93 12/20/2019 0829   MCH 29.6 07/21/2021 0828   MCHC 31.9 07/21/2021 0828  RDW 17.5 (H) 07/21/2021 0828   RDW 13.5 12/20/2019 0829   LYMPHSABS 0.7 07/21/2021 0828   LYMPHSABS 1.3 12/20/2019 0829   MONOABS 0.3 07/21/2021 0828   EOSABS 0.1 07/21/2021 0828   EOSABS 0.2 12/20/2019 0829   BASOSABS 0.0 07/21/2021 0828   BASOSABS 0.0 12/20/2019 0829      Latest Ref Rng & Units 07/21/2021    8:28 AM 07/03/2021    4:39 PM 06/19/2021    7:40  AM  BMP  Glucose 70 - 99 mg/dL 97   96   115    BUN 8 - 23 mg/dL '15   12   18    ' Creatinine 0.44 - 1.00 mg/dL 0.56   0.61   0.55    Sodium 135 - 145 mmol/L 142   142   141    Potassium 3.5 - 5.1 mmol/L 3.8   3.8   3.4    Chloride 98 - 111 mmol/L 105   105   106    CO2 22 - 32 mmol/L '29   29   27    ' Calcium 8.9 - 10.3 mg/dL 9.4   9.3   9.4      Chest imaging: CXR 05/21/20 Increased attenuation towards the lung bases likely attributable in part to body habitus. Mild diffuse interstitial opacity with vascular cephalization and congestion, cuffing, peripheral septal lines and fissural thickening. No visible pneumothorax or effusion.  CTA Chest 04/27/20 1. No acute pulmonary embolism. 2. Small bilateral pleural effusions with adjacent atelectasis. 3. Stable mild interlobular septal thickening, which may be seen in patients with mild interstitial edema. 4. Aberrant right subclavian artery, a normal variant.  PFT:    Latest Ref Rng & Units 05/08/2020   10:51 AM  PFT Results  FVC-Pre L 2.63    FVC-Predicted Pre % 81    FVC-Post L 2.74    FVC-Predicted Post % 85    Pre FEV1/FVC % % 81    Post FEV1/FCV % % 85    FEV1-Pre L 2.13    FEV1-Predicted Pre % 86    FEV1-Post L 2.33    DLCO uncorrected ml/min/mmHg 27.60    DLCO UNC% % 135    DLCO corrected ml/min/mmHg 28.51    DLCO COR %Predicted % 140    DLVA Predicted % 150    TLC L 4.74    TLC % Predicted % 91    RV % Predicted % 73     Echo: 04/27/20 1. Left ventricular ejection fraction, by estimation, is 55 to 60%. The  left ventricle has normal function. The left ventricle has no regional  wall motion abnormalities. Left ventricular diastolic parameters were  normal.   2. Right ventricular systolic function is normal. The right ventricular  size is normal.   3. Left atrial size was mildly dilated.   4. The mitral valve is normal in structure. No evidence of mitral valve  regurgitation. No evidence of mitral stenosis.   5.  The aortic valve was not well visualized. Aortic valve regurgitation  is not visualized. No aortic stenosis is present.   6. The inferior vena cava is dilated in size with >50% respiratory  variability, suggesting right atrial pressure of 8 mmHg.   Sleep Study 06/18/20 IMPRESSIONS - Moderate to severe obstructive sleep apnea with an AHI of 29.9 and SpO2 low of 85%. - She did well with CPAP at 8 cm H2O.  - She did not require supplemental oxygen during this study.  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   She has mild intermittent asthma that can be aggravated by GERD, sinus disease and spring allergies.   We discussed that she can use flovent 1-2 puffs twice daily as needed. If she is noticing persistent symptoms for multiple days in a row, then she can take 2 puffs twice daily scheduled. She can continue to use albuterol as needed.    She is to use flonase as needed for nasal/sinus congestion or drainage.   She is to continue taking pantoprazole and elevating the head of her bed for GERD treatment.   She is to continue on CPAP therapy with a pressure of 4-8cmH2O auto-titration. We will request a download from her new machine for review.   I will discuss chest imaging surveillance with Dr. [name] as she would prefer to monitor her lungs/chest with CT scans annually.  Follow up in 1 year  Freda Jackson, MD [facility] Office: [phone]    Current Outpatient Medications:    acetaminophen (TYLENOL)  500 MG tablet, Take 1,000 mg by mouth every 6 (six) hours as needed for mild pain, moderate pain, fever or headache., Disp: , Rfl:    albuterol (PROVENTIL) (2.5 MG/3ML) 0.083% nebulizer solution, Take 2.5 mg by nebulization every 6 (six) hours as needed for wheezing or shortness of breath., Disp: , Rfl:    albuterol (VENTOLIN HFA) 108 (90 Base) MCG/ACT inhaler, Inhale 2 puffs into the lungs every 6 (six) hours as needed for wheezing or shortness of breath., Disp: 8 g, Rfl: 6   Ascorbic Acid (VITAMIN C PO), Take by mouth., Disp: , Rfl:    atorvastatin (LIPITOR) 20 MG tablet, Take 20 mg by mouth daily., Disp: , Rfl:    BIOTIN PO, Take 1 tablet by mouth daily., Disp: , Rfl:    Cholecalciferol (VITAMIN D-3) 25 MCG (1000 UT) CAPS, Take 1,000 Units by mouth daily., Disp: , Rfl:    co-enzyme Q-10 30 MG capsule, Take by mouth., Disp: , Rfl:    fluticasone (FLOVENT HFA) 110 MCG/ACT inhaler, Inhale 2 puffs into the lungs 2 (two) times daily as needed., Disp: 1 each, Rfl: 12   hydrocortisone 2.5 % cream, Apply topically., Disp: , Rfl:    loratadine (CLARITIN) 10 MG tablet, Take 10 mg by mouth daily., Disp: , Rfl:    losartan (COZAAR) 50 MG tablet, Take 50 mg by mouth daily., Disp: , Rfl:    Melatonin 5 MG CHEW, Chew 5 mg by mouth at bedtime. , Disp: , Rfl:    metFORMIN (GLUCOPHAGE) 500 MG tablet, Take 500 mg by mouth 2 (two) times daily., Disp: , Rfl:    Multiple Vitamins-Minerals (MULTIVITAMIN WITH MINERALS) tablet, Take 1 tablet by mouth daily., Disp: , Rfl:    pantoprazole (PROTONIX) 40 MG tablet, Take 1 tablet (40 mg total) by mouth daily., Disp: 30 tablet, Rfl: 1   polyethylene glycol (MIRALAX / GLYCOLAX) 17 g packet, Take 8.5 g by mouth 2 (two) times daily., Disp: , Rfl:    Probiotic Product (PROBIOTIC BLEND PO), Take by mouth., Disp: , Rfl:  sennosides-docusate sodium (SENOKOT-S) 8.6-50 MG tablet, Take 1 tablet by mouth 2 (two) times daily as needed for constipation., Disp: , Rfl:  No current  facility-administered medications for this visit.  Facility-Administered Medications Ordered in Other Visits:    heparin lock flush 100 unit/mL, 500 Units, Intracatheter, Once, Gorsuch, Ni, MD   sodium chloride flush (NS) 0.9 % injection 10 mL, 10 mL, Intracatheter, Once, [name], MD

## 2021-11-04 NOTE — Patient Instructions (Addendum)
Use flovent 1-2 puffs twice daily as needed ?- rinse mouth out after each use ? ?Continue to use albuterol inhaler 1-2 puffs every 4-6 years as needed  ? ?We will check a download from your CPAP machine to monitor the usage and setting adjustments ? ?Follow up in 1 year ?

## 2021-11-05 ENCOUNTER — Encounter: Payer: Self-pay | Admitting: Pulmonary Disease

## 2022-01-06 ENCOUNTER — Telehealth: Payer: Self-pay

## 2022-01-06 NOTE — Telephone Encounter (Signed)
Patient called to request change in appointments as patient had moved her CT scan to 8/2 and her appointment with Dr. [name] was scheduled for 8/11. Patient informed that Dr. Calton Dach earliest availability is 8/10 at 1:30 PM. Patient also advised that she could have CT moved to 8/8 instead. Patient will keep scheduled CT scan as is, but change lab and MD appointments to 8/10. Appointments adjusted accordingly. In addition, discussed new symptoms of burning sensation in stomach and intestines. Dr. [name] is aware and would recommend that patient continue with daily protonix and utilize tums prn to help with sensation. Also discussed diet interventions that patient can try to help with the sensation.  Patient verbalized an understanding of the recommendations and notes that she will try these for the next couple of days and let us know if they are effective. Patient appreciative of call and knows to call if she has any additional questions or concerns.

## 2022-01-20 ENCOUNTER — Ambulatory Visit (HOSPITAL_COMMUNITY)
Admission: RE | Admit: 2022-01-20 | Discharge: 2022-01-20 | Disposition: A | Discharge status: Home / Self Care | Payer: Medicare Other | Source: Ambulatory Visit | Attending: Hematology and Oncology | Admitting: Hematology and Oncology

## 2022-01-20 DIAGNOSIS — C5701 Malignant neoplasm of right fallopian tube: Secondary | ICD-10-CM | POA: Insufficient documentation

## 2022-01-20 MED ORDER — SODIUM CHLORIDE (PF) 0.9 % IJ SOLN
INTRAMUSCULAR | Status: AC
  Filled 2022-01-20: qty 50

## 2022-01-20 MED ORDER — IOHEXOL 300 MG/ML  SOLN
100.0000 mL | Freq: Once | INTRAMUSCULAR | Status: AC | PRN
  Administered 2022-01-20: 100 mL via INTRAVENOUS

## 2022-01-22 ENCOUNTER — Other Ambulatory Visit: Payer: Medicare Other

## 2022-01-22 ENCOUNTER — Ambulatory Visit: Payer: Medicare Other | Admitting: Hematology and Oncology

## 2022-01-26 ENCOUNTER — Telehealth: Payer: Self-pay | Admitting: *Deleted

## 2022-01-26 ENCOUNTER — Other Ambulatory Visit: Payer: Self-pay | Admitting: Hematology and Oncology

## 2022-01-26 DIAGNOSIS — R300 Dysuria: Secondary | ICD-10-CM

## 2022-01-26 NOTE — Telephone Encounter (Signed)
I placed orders. 

## 2022-01-26 NOTE — Telephone Encounter (Signed)
Patient called and requested to have urinalysis done on Thursday 01/28/22. She has a lab appt and an appt to see Dr. [name]. She described symptoms of burning w/urination and external irritation. Denies fever or other symptoms. Message routed to MD

## 2022-01-28 ENCOUNTER — Inpatient Hospital Stay: Payer: Medicare Other | Attending: Hematology and Oncology | Admitting: Hematology and Oncology

## 2022-01-28 ENCOUNTER — Encounter: Payer: Self-pay | Admitting: Hematology and Oncology

## 2022-01-28 ENCOUNTER — Other Ambulatory Visit: Payer: Self-pay

## 2022-01-28 ENCOUNTER — Inpatient Hospital Stay: Payer: Medicare Other

## 2022-01-28 DIAGNOSIS — I700 Atherosclerosis of aorta: Secondary | ICD-10-CM | POA: Diagnosis not present

## 2022-01-28 DIAGNOSIS — C762 Malignant neoplasm of abdomen: Secondary | ICD-10-CM

## 2022-01-28 DIAGNOSIS — C561 Malignant neoplasm of right ovary: Secondary | ICD-10-CM | POA: Insufficient documentation

## 2022-01-28 DIAGNOSIS — D61818 Other pancytopenia: Secondary | ICD-10-CM | POA: Insufficient documentation

## 2022-01-28 DIAGNOSIS — J90 Pleural effusion, not elsewhere classified: Secondary | ICD-10-CM | POA: Diagnosis not present

## 2022-01-28 DIAGNOSIS — K449 Diaphragmatic hernia without obstruction or gangrene: Secondary | ICD-10-CM | POA: Insufficient documentation

## 2022-01-28 DIAGNOSIS — C5701 Malignant neoplasm of right fallopian tube: Secondary | ICD-10-CM | POA: Diagnosis not present

## 2022-01-28 DIAGNOSIS — Z79899 Other long term (current) drug therapy: Secondary | ICD-10-CM | POA: Diagnosis not present

## 2022-01-28 DIAGNOSIS — C786 Secondary malignant neoplasm of retroperitoneum and peritoneum: Secondary | ICD-10-CM | POA: Diagnosis not present

## 2022-01-28 DIAGNOSIS — R300 Dysuria: Secondary | ICD-10-CM

## 2022-01-28 DIAGNOSIS — N760 Acute vaginitis: Secondary | ICD-10-CM | POA: Insufficient documentation

## 2022-01-28 DIAGNOSIS — E669 Obesity, unspecified: Secondary | ICD-10-CM | POA: Insufficient documentation

## 2022-01-28 DIAGNOSIS — Z7984 Long term (current) use of oral hypoglycemic drugs: Secondary | ICD-10-CM | POA: Insufficient documentation

## 2022-01-28 DIAGNOSIS — K5909 Other constipation: Secondary | ICD-10-CM | POA: Insufficient documentation

## 2022-01-28 LAB — CBC WITH DIFFERENTIAL (CANCER CENTER ONLY)
Abs Immature Granulocytes: 0.01 10*3/uL (ref 0.00–0.07)
Basophils Absolute: 0 10*3/uL (ref 0.0–0.1)
Basophils Relative: 1 %
Eosinophils Absolute: 0.1 10*3/uL (ref 0.0–0.5)
Eosinophils Relative: 4 %
HCT: 38.4 % (ref 36.0–46.0)
Hemoglobin: 12.7 g/dL (ref 12.0–15.0)
Immature Granulocytes: 0 %
Lymphocytes Relative: 32 %
Lymphs Abs: 1 10*3/uL (ref 0.7–4.0)
MCH: 30 pg (ref 26.0–34.0)
MCHC: 33.1 g/dL (ref 30.0–36.0)
MCV: 90.8 fL (ref 80.0–100.0)
Monocytes Absolute: 0.3 10*3/uL (ref 0.1–1.0)
Monocytes Relative: 10 %
Neutro Abs: 1.7 10*3/uL (ref 1.7–7.7)
Neutrophils Relative %: 53 %
Platelet Count: 216 10*3/uL (ref 150–400)
RBC: 4.23 MIL/uL (ref 3.87–5.11)
RDW: 13.6 % (ref 11.5–15.5)
WBC Count: 3.2 10*3/uL — ABNORMAL LOW (ref 4.0–10.5)
nRBC: 0 % (ref 0.0–0.2)

## 2022-01-28 LAB — CMP (CANCER CENTER ONLY)
ALT: 17 U/L (ref 0–44)
AST: 18 U/L (ref 15–41)
Albumin: 4.4 g/dL (ref 3.5–5.0)
Alkaline Phosphatase: 67 U/L (ref 38–126)
Anion gap: 5 (ref 5–15)
BUN: 10 mg/dL (ref 8–23)
CO2: 31 mmol/L (ref 22–32)
Calcium: 9.3 mg/dL (ref 8.9–10.3)
Chloride: 106 mmol/L (ref 98–111)
Creatinine: 0.64 mg/dL (ref 0.44–1.00)
GFR, Estimated: >60 mL/min (ref >60)
Glucose, Bld: 100 mg/dL — ABNORMAL HIGH (ref 70–99)
Potassium: 4.6 mmol/L (ref 3.5–5.1)
Sodium: 142 mmol/L (ref 135–145)
Total Bilirubin: 0.7 mg/dL (ref 0.3–1.2)
Total Protein: 7.3 g/dL (ref 6.5–8.1)

## 2022-01-28 LAB — URINALYSIS, COMPLETE (UACMP) WITH MICROSCOPIC
Bilirubin Urine: NEGATIVE
Glucose, UA: NEGATIVE mg/dL
Ketones, ur: NEGATIVE mg/dL
Leukocytes,Ua: NEGATIVE
Nitrite: NEGATIVE
Protein, ur: NEGATIVE mg/dL
Specific Gravity, Urine: 1.003 — ABNORMAL LOW (ref 1.005–1.030)
pH: 8 (ref 5.0–8.0)

## 2022-01-28 MED ORDER — ESTRADIOL 0.1 MG/GM VA CREA: 1.0000 | TOPICAL_CREAM | Freq: Every day | VAGINAL | 12 refills | Status: AC

## 2022-01-28 NOTE — Assessment & Plan Note (Signed)
She has mild symptoms of dysuria I suspect it could be due to vaginal atrophy or vaginitis I recommend her to resume taking topical Estrace

## 2022-01-28 NOTE — Assessment & Plan Note (Signed)
She has developed mild constipation since her major surgery She will continue to take laxatives as needed

## 2022-01-28 NOTE — Assessment & Plan Note (Signed)
Her pancytopenia is improving She has mild persistent leukopenia but not symptomatic Observe

## 2022-01-28 NOTE — Progress Notes (Signed)
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[facility]) We reviewed CT imaging We discussed the role of tumor marker monitoring She does not need repeat imaging study for another 6 months Recommend return visit in 3 months with blood work to be done ahead of time I also recommend GYN follow-up for pelvic exam and monitoring  Pancytopenia, acquired ([place]) Her pancytopenia is improving She has mild persistent leukopenia but not symptomatic Observe  Obesity, Class II, BMI 35-39.9 The patient is attempting to eat healthy but has not been tracking her food intake I recommend the patient to start tracking her food intake and to aim for higher protein intake Encouraged her to continue her lifestyle changes including exercise  Other constipation She has developed mild constipation since her major surgery She will continue to take laxatives as needed  Vaginitis She has mild symptoms of dysuria I suspect it could be due to vaginal atrophy or vaginitis I recommend her to resume taking topical Estrace  No orders of the defined types were placed in this encounter.   All questions were answered. The patient knows to call the clinic with any problems, questions or concerns. The total time spent in the appointment was 30 minutes encounter with patients including review of chart and various tests results, discussions about plan of care and coordination of care plan   [name], MD 01/28/2022 3:20 PM  INTERVAL HISTORY: Please see below for problem oriented charting. she returns for surveillance follow-up She is doing well She has gained a lot of weight She is exercising on a regular basis She has mild chronic constipation causing  intermittent bloating She has recent dysuria She haS questions related to healthy living, specific food intake and the role of metformin/atorvastatin for chemoprevention  REVIEW OF SYSTEMS:   Constitutional: Denies fevers, chills or abnormal weight loss Eyes: Denies blurriness of vision Ears, nose, mouth, throat, and face: Denies mucositis or sore throat Respiratory: Denies cough, dyspnea or wheezes Cardiovascular: Denies palpitation, chest discomfort or lower extremity swelling Skin: Denies abnormal skin rashes Lymphatics: Denies new lymphadenopathy or easy bruising Neurological:Denies numbness, tingling or new weaknesses Behavioral/Psych: Mood is stable, no new changes  All other systems were reviewed with the patient and are negative.  I have reviewed the past medical history, past surgical history, social history and family history with the patient and they are unchanged from previous note.  ALLERGIES:  is allergic to macrobid [nitrofurantoin], ciprofloxacin, codeine, and azithromycin.  MEDICATIONS:  Current Outpatient Medications  Medication Sig Dispense Refill   estradiol (ESTRACE VAGINAL) 0.1 MG/GM vaginal cream Place 1 Applicatorful vaginally at bedtime. 42.5 g 12   Melatonin 5 MG CHEW Chew 20 mg by mouth at bedtime.     acetaminophen (TYLENOL) 500 MG tablet Take 1,000 mg by mouth every 6 (six) hours as needed for mild pain, moderate pain, fever or headache.     albuterol (PROVENTIL) (2.5 MG/3ML) 0.083% nebulizer solution Take 2.5 mg by nebulization every 6 (six) hours as needed for wheezing or shortness of breath.     albuterol (VENTOLIN HFA) 108 (90 Base) MCG/ACT inhaler Inhale 2 puffs into the lungs every 6 (six) hours as needed for wheezing or shortness of breath. 8 g 6   Ascorbic Acid (VITAMIN C PO) Take by  mouth.     atorvastatin (LIPITOR) 20 MG tablet Take 20 mg by mouth daily.     BIOTIN PO Take 1 tablet by mouth daily.     Cholecalciferol (VITAMIN D-3) 25 MCG (1000 UT)  CAPS Take 1,000 Units by mouth daily.     co-enzyme Q-10 30 MG capsule Take by mouth.     fluticasone (FLOVENT HFA) 110 MCG/ACT inhaler Inhale 2 puffs into the lungs 2 (two) times daily as needed. 1 each 12   hydrocortisone 2.5 % cream Apply topically.     loratadine (CLARITIN) 10 MG tablet Take 10 mg by mouth daily.     losartan (COZAAR) 50 MG tablet Take 50 mg by mouth daily.     metFORMIN (GLUCOPHAGE) 500 MG tablet Take 500 mg by mouth 2 (two) times daily.     Multiple Vitamins-Minerals (MULTIVITAMIN WITH MINERALS) tablet Take 1 tablet by mouth daily.     pantoprazole (PROTONIX) 40 MG tablet Take 1 tablet (40 mg total) by mouth daily. 30 tablet 1   polyethylene glycol (MIRALAX / GLYCOLAX) 17 g packet Take 8.5 g by mouth 2 (two) times daily.     Probiotic Product (PROBIOTIC BLEND PO) Take by mouth.     sennosides-docusate sodium (SENOKOT-S) 8.6-50 MG tablet Take 1 tablet by mouth 2 (two) times daily as needed for constipation.     No current facility-administered medications for this visit.   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 - Diverticulosis in the sigmoid colon and in the descending colon. - No  specimens collected. - Blood in stool without cause found on endoscopic exam   05/08/2020 Imaging   1. Bladder is decompressed though demonstrates significant perivesicular hazy stranding, mucosal hyperemia and edematous mural thickening. Findings are suggestive of cystitis. Correlate with urinalysis. No abnormal perinephric or periureteral stranding or other features to suggest an ascending tract infection at this time. 2. Colonic diverticulosis without evidence of acute diverticulitis. 3. Aortic Atherosclerosis (ICD10-I70.0).   12/28/2020 Imaging   1. 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 Differential for peritoneal carcinomatosis is primarily neoplastic and includes cancers of the ovary, appendix, colon, pancreas and stomach. PET-CT may be helpful for further characterization. Tissue sampling of the mesentery may be eventually required for diagnosis. 2. Mildly distended small bowel loops throughout the abdomen and pelvis, with fluid and associated air-fluid levels throughout the nondistended small bowel, suggesting a mild ileus versus partial small bowel obstruction. Favor partial small bowel obstruction with transition zone in the RIGHT lower quadrant, likely related to aforementioned neoplastic peritoneal implants and/or adhesions. 3. Small free fluid within the  RIGHT upper quadrant and LEFT upper quadrant. No abscess collection seen. No free intraperitoneal air seen. 4. Extensive colonic diverticulosis without evidence of acute diverticulitis. 5. Small chronic pleural effusions with associated atelectasis.   12/29/2020 Tumor Marker   Patient's tumor was tested for the following markers: CA-125. Results of the tumor marker test revealed 95.1.   12/30/2020 Surgery   Preoperative diagnosis: abdominal nodules   Postoperative diagnosis:  same   Procedure: diagnostic laparoscopy with abdominal wall biopsy   Surgeon: Luke Kinsinger, M.D.    Indications for procedure: Brittany Middleton is a 70 y.o. year old female with symptoms of nausea, vomiting, and abdominal pain. Work up was concerning for cancer with abdominal wall studding.   Description of procedure: The patient was brought into the operative suite. Anesthesia was administered with General endotracheal anesthesia. WHO checklist was applied. The patient was then placed in supine position. The area was prepped and draped in the usual sterile fashion.   A small left subcostal incision was made. A 5mm trocar was used to gain access to the peritoneal cavity by optical entry technique. Pneumoperitoneum was applied with a high flow and low pressure. The laparoscope was reinserted to confirm position.   On initial visualization of the abdomen, there were multiple nodules throughout the abdominal wall. There were multiple nodules over the small intestine and mesentery. There was some loops of small intestine that were matted together. There was a large white mass in the left upper quadrant. Multiple areas of peritoneum were removed with harmonic scalpel. The large mass was inspected. It appeared to be related to the colon and omentum. Grasper was used to remove some superficial tissue and was sent for culture.   The abdomen was desufflated. The incisions were closed with 4-0 monocryl subcuticular stitch.   12/30/2020 Pathology Results   FINAL MICROSCOPIC DIAGNOSIS:   A. ABDOMINAL WALL, NODULE, EXCISION:  -  Metastatic adenocarcinoma  -  See comment   B. PERI COLONIC EXUDATE, EXCISION:  -  Metastatic adenocarcinoma  -  See comment   COMMENT:   By immunohistochemistry, the neoplastic cells are positive for cytokeratin 7, PAX8 and WT1 but negative for cytokeratin 20, CDX2, p53 and TTF-1.  The morphology and immunophenotype are consistent with a gynecologic primary.      01/01/2021 Initial Diagnosis   Peritoneal carcinomatosis (HCC)   01/08/2021 Cancer Staging   Staging form: Ovary, Fallopian Tube, and Primary Peritoneal Carcinoma, AJCC 8th Edition - Clinical stage from 01/08/2021: cT3, cN0, cM0 - Signed by Gorsuch, Ni, MD on 01/08/2021 Stage prefix: Initial diagnosis   01/12/2021 - 03/11/2021 Chemotherapy   Patient is on Treatment Plan : OVARIAN Carboplatin (AUC 6) / Paclitaxel (175) q21d x 6 cycles     01/17/2021 Imaging   MRI brain No evidence of intracranial metastatic disease or other acute abnormality   02/12/2021 Genetic Testing   No pathogenic variants detected in [facility].  The report date is February 12, 2021.   The MyRisk gene panel offered by [facility] includes sequencing and deletion/duplication testing of the following 48 genes: APC, ATM, AXIN2, BAP1, BARD1, BMPR1A, BRCA1, BRCA2, BRIP1, CHD1, CDK4, CDKN2A(p16 and p14ARF), CHEK2, CTNNA1, EGFR, EPCAM, FH, FLCN, GREM1, HOXB13, MEN1, MET, MITF , MLH1, MSH2, MSH3, MSH6, MUTYH, NTHL1, PALB2, PMS2, POLD1, POLE, PTEN, RAD51C, RAD51D, RET, SDHA, SDHB, SDHC, SDHD, SMAD4, STK11,TERT, TP53, TSC1, TSC2, and VHL.  Unless otherwise specified, all coding regions and flanking non-coding regions are analyzed for sequence variation. Analysis of   flanking intronic regions typically do not extend more than 20 bp before and 10 bp after each exon, though the exact region may be adjusted based on the presence of either potentially significant variants or highly repetitive sequences. Coding regions and proximal promoter regions near the transcription start sites are analyzed for large deletions or duplications. Specific genes are tested only for sequence and/or CNVs within limited regions. Limited clinically relevant regions are included for EGFR (sequencing and CNV analysis of exons 18-21),RET (sequencing and CNV analysis of exons 5, 8, 10, 11, and 13-16), and MITF (sequencing of position  c.952). Only CNV analysis of the last two exons of EPCAM is performed. CNV analysis of GREM1 includes the upstream region overlapping the adjacent gene SCG5. MSH3 exon 1 contains a long polyalanine repeat that can interfere with variant calling; therefore, MSH3 analysis excludes c.121 to c.237. Only sequence analysis of the exons encompassing the exonuclease domains of these genes is performed (POLD1 c.841 to c.1686, POLE c.802 to c.1473). Limited promoter regions in selected genes undergo sequence analysis including TERT (c.-71 to c.-1), and APC Promoter 1B (c.-195 to c.-190 and c.-125 ([id number]).   HRD testing pending.    03/02/2021 Imaging   Outside imaging at [facility] improved peritoneal carcinomatosis. No evidence of new metastatic disease or acute abdominal pathology.    03/02/2021 Imaging   Outside CT imaging done at [facility]  Slighty improved peritoneal carcinomatosis. No evidence of new metastatic disease or acute abdominal pathology   03/02/2021 - 03/03/2021 Hospital Admission   She was hospitalized briefly for evaluation of hematochezia at [facility].  She underwent CT imaging and colonoscopy and was subsequently discharged   03/03/2021 Procedure   Colonoscopy was performed at [facility]  Findings: Diverticula were found in the entire colon. Internal hemorrhoids were found during retroflexion.  The hemorrhoids were Grade I (internal hemorrhoids that do not prolapse). Impression: - Diverticulosis in the entire examined colon. Likely source of bleeding. - Internal hemorrhoids.   04/07/2021 Surgery   PROCEDURES:  Exam under anesthesia Diagnostic laparoscopy Exploratory laparotomy Lysis of adhesions (>30 minutes) Infragastric omentectomy Total abdominal hysterectomy and bilateral salpingo-oophorectomy Optimal (R0) interval tumor debulking  SURGEON: Surgeon(s) and Role: * Berchuck, Andrew, MD - Primary * Salinaro, Julia Rose, MD - Resident - Assisting * Albright, Benjamin Brown, MD -  Fellow  INDICATION(S): 70 y.o. who presented with carcinomatosis and mildly elevated CA125 to 95 with biopsy showing mullerian adenocarcinoma, s/p 3 cycles of neoadjuvant chemotherapy with normalization of CA125 and decreased disease burden on imaging.  OPERATIVE FINDINGS:  On exam, no palpable masses. On laparoscopy, omentum thickened and retracted along the transverse colon, pelvic adhesive disease. On laparotomy, omentum thickened, retracted, and densely adherent to the underlying colon mesentery, but without definitive viable tumor. Adhesions of the omentum to the descending colon, rectosigmoid colon to the left adnexa and posterior uterus and cervix, loop of ileum to the right adnexa, and bladder to anterior uterus and cervix. Transverse colon with small diverticulum that was over-sewed. Appendix normal and retrocecal. No grossly visible tumor in the abdomen including smooth diaphragm and liver surface, normal stomach, small bowel, colon, and normal appearing uterus, bilateral fallopian tubes, and bilateral ovaries.  At the conclusion of the case, no gross residual disease remained (R0).    04/07/2021 Pathology Results   Final pathology showed residual high-grade serous carcinoma of the right fallopian tube with STIC lesion, with negative ovary, negative omentum (with treated tumor), negative uterus, left adnexa (path report error saying negative right adnexa),   and washings.   05/22/2021 - 06/19/2021 Chemotherapy   Patient is on Treatment Plan : OVARIAN Carboplatin (AUC 6) / Paclitaxel (175) q21d x 6 cycles      06/19/2021 Tumor Marker   Patient's tumor was tested for the following markers: CA-125. Results of the tumor marker test revealed 3.   07/22/2021 Tumor Marker   Patient's tumor was tested for the following markers: CA-125. Results of the tumor marker test revealed 3.1.   07/23/2021 Imaging   1. No evidence metastatic ovarian carcinoma. 2. No peritoneal nodularity or fluid. 3. Post  hysterectomy and oophorectomy       08/19/2021 Procedure   Successful removal of RIGHT chest implanted Port-A-Cath, as above   09/24/2021 Tumor Marker   Patient's tumor was tested for the following markers: CA-125. Results of the tumor marker test revealed 3.7.   01/20/2022 Imaging   1. Stable. No evidence for metastatic disease in the abdomen or pelvis. 2. Tiny gas bubble identified in the bladder lumen. Correlation witfor h recent instrumentation recommended. In the absence of recent instrumentation, bladder infection would be a consideration. 3. Tiny hiatal hernia. 4. Left colonic diverticulosis without diverticulitis. 5. Aortic Atherosclerosis (ICD10-I70.0).     PHYSICAL EXAMINATION: ECOG PERFORMANCE STATUS: 1 - Symptomatic but completely ambulatory  Vitals:   01/28/22 1244  BP: (!) 149/73  Pulse: 63  Resp: 18  Temp: 97.7 F (36.5 C)  SpO2: 94%   Filed Weights   01/28/22 1244  Weight: 236 lb 3.2 oz (107.1 kg)    GENERAL:alert, no distress and comfortable NEURO: alert & oriented x 3 with fluent speech, no focal motor/sensory deficits  LABORATORY DATA:  I have reviewed the data as listed    Component Value Date/Time   NA 142 01/28/2022 1221   NA 143 12/20/2019 0829   K 4.6 01/28/2022 1221   CL 106 01/28/2022 1221   CO2 31 01/28/2022 1221   GLUCOSE 100 (H) 01/28/2022 1221   BUN 10 01/28/2022 1221   BUN 16 12/20/2019 0829   CREATININE 0.64 01/28/2022 1221   CALCIUM 9.3 01/28/2022 1221   PROT 7.3 01/28/2022 1221   PROT 6.4 02/04/2020 1036   ALBUMIN 4.4 01/28/2022 1221   ALBUMIN 4.2 02/04/2020 1036   AST 18 01/28/2022 1221   ALT 17 01/28/2022 1221   ALKPHOS 67 01/28/2022 1221   BILITOT 0.7 01/28/2022 1221   GFRNONAA >60 01/28/2022 1221   GFRAA 104 12/20/2019 0829    No results found for: "SPEP", "UPEP"  Lab Results  Component Value Date   WBC 3.2 (L) 01/28/2022   NEUTROABS 1.7 01/28/2022   HGB 12.7 01/28/2022   HCT 38.4 01/28/2022   MCV 90.8  01/28/2022   PLT 216 01/28/2022      Chemistry      Component Value Date/Time   NA 142 01/28/2022 1221   NA 143 12/20/2019 0829   K 4.6 01/28/2022 1221   CL 106 01/28/2022 1221   CO2 31 01/28/2022 1221   BUN 10 01/28/2022 1221   BUN 16 12/20/2019 0829   CREATININE 0.64 01/28/2022 1221      Component Value Date/Time   CALCIUM 9.3 01/28/2022 1221   ALKPHOS 67 01/28/2022 1221   AST 18 01/28/2022 1221   ALT 17 01/28/2022 1221   BILITOT 0.7 01/28/2022 1221       RADIOGRAPHIC STUDIES: I have personally reviewed the radiological images as listed and agreed with the findings in the report. CT ABDOMEN PELVIS W CONTRAST  Result Date:   01/20/2022 CLINICAL DATA:  Ovarian cancer.  Restaging.  * Tracking Code: BO * EXAM: CT ABDOMEN AND PELVIS WITH CONTRAST TECHNIQUE: Multidetector CT imaging of the abdomen and pelvis was performed using the standard protocol following bolus administration of intravenous contrast. RADIATION DOSE REDUCTION: This exam was performed according to the departmental dose-optimization program which includes automated exposure control, adjustment of the mA and/or kV according to patient size and/or use of iterative reconstruction technique. CONTRAST:  100mL OMNIPAQUE IOHEXOL 300 MG/ML  SOLN COMPARISON:  10/14/2021 CT scan from [facility]. Chest abdomen pelvis CT 07/21/2021. FINDINGS: Lower chest: Unremarkable. Hepatobiliary: No suspicious focal abnormality within the liver parenchyma. There is no evidence for gallstones, gallbladder wall thickening, or pericholecystic fluid. No intrahepatic or extrahepatic biliary dilation. Pancreas: No focal mass lesion. No dilatation of the main duct. No intraparenchymal cyst. No peripancreatic edema. Spleen: No splenomegaly. No focal mass lesion. Adrenals/Urinary Tract: No adrenal nodule or mass. Kidneys unremarkable. No evidence for hydroureter. Tiny gas bubble identified in the bladder lumen. Stomach/Bowel: Tiny hiatal hernia. Stomach  otherwise unremarkable. Duodenum is normally positioned as is the ligament of Treitz. No small bowel wall thickening. No small bowel dilatation. The terminal ileum is normal. The appendix is normal. No gross colonic mass. No colonic wall thickening. Diverticular changes are noted in the left colon without evidence of diverticulitis. Vascular/Lymphatic: There is mild atherosclerotic calcification of the abdominal aorta without aneurysm. There is no gastrohepatic or hepatoduodenal ligament lymphadenopathy. No retroperitoneal or mesenteric lymphadenopathy. No pelvic sidewall lymphadenopathy. Reproductive: Uterus surgically absent.  There is no adnexal mass. Other: No intraperitoneal free fluid. Musculoskeletal: No worrisome lytic or sclerotic osseous abnormality. Degenerative changes noted lumbar spine. IMPRESSION: 1. Stable. No evidence for metastatic disease in the abdomen or pelvis. 2. Tiny gas bubble identified in the bladder lumen. Correlation wit for h recent instrumentation recommended. In the absence of recent instrumentation, bladder infection would be a consideration. 3. Tiny hiatal hernia. 4. Left colonic diverticulosis without diverticulitis. 5. Aortic Atherosclerosis (ICD10-I70.0). Electronically Signed   By: Eric  Mansell M.D.   On: 01/20/2022 12:56    

## 2022-01-28 NOTE — Assessment & Plan Note (Signed)
We reviewed CT imaging We discussed the role of tumor marker monitoring She does not need repeat imaging study for another 6 months Recommend return visit in 3 months with blood work to be done ahead of time I also recommend GYN follow-up for pelvic exam and monitoring

## 2022-01-28 NOTE — Assessment & Plan Note (Signed)
The patient is attempting to eat healthy but has not been tracking her food intake I recommend the patient to start tracking her food intake and to aim for higher protein intake Encouraged her to continue her lifestyle changes including exercise

## 2022-01-29 ENCOUNTER — Other Ambulatory Visit: Payer: Medicare Other

## 2022-01-29 ENCOUNTER — Ambulatory Visit: Payer: Medicare Other | Admitting: Hematology and Oncology

## 2022-01-29 LAB — URINE CULTURE: Culture: NO GROWTH

## 2022-01-30 LAB — CA 125: Cancer Antigen (CA) 125: 3.8 U/mL (ref 0.0–38.1)

## 2022-02-01 ENCOUNTER — Telehealth: Payer: Self-pay

## 2022-02-01 NOTE — Telephone Encounter (Signed)
Called and given below message. She verbalized understanding and appreciated the call . °

## 2022-02-01 NOTE — Telephone Encounter (Signed)
-----   Message from [name], MD sent at 02/01/2022  8:39 AM EDT ----- Pls let her know CA-125 looks good

## 2022-04-19 ENCOUNTER — Telehealth: Payer: Self-pay | Admitting: Pulmonary Disease

## 2022-04-19 NOTE — Telephone Encounter (Signed)
Fax received from pt's pharmacy stating that the fluticasone HFA was considered non formulary drug with pt's insurance. Stated for alternative to be considered.  Routing to Prior auth team as well as Dr. Erin Fulling for review.

## 2022-04-20 ENCOUNTER — Encounter: Payer: Self-pay | Admitting: Hematology and Oncology

## 2022-04-20 ENCOUNTER — Other Ambulatory Visit (HOSPITAL_COMMUNITY): Payer: Self-pay

## 2022-04-20 NOTE — Telephone Encounter (Signed)
Per benefits investigation- insurance shows that alternative of Flovent Diskus is showing a co-pay of $40.99 at this time.

## 2022-04-20 NOTE — Telephone Encounter (Signed)
Routing to Dr. Erin Fulling, please advise.

## 2022-04-21 NOTE — Telephone Encounter (Signed)
Is advair covered under her insurance?

## 2022-04-22 ENCOUNTER — Other Ambulatory Visit (HOSPITAL_COMMUNITY): Payer: Self-pay

## 2022-04-22 NOTE — Telephone Encounter (Signed)
Please send in script for advair diskus 100-37mg 1 puff twice daily as needed.  Thanks, JD

## 2022-04-22 NOTE — Telephone Encounter (Signed)
Per test claim Advair Diskus is covered under patients insurance.

## 2022-04-23 MED ORDER — FLUTICASONE-SALMETEROL 100-50 MCG/ACT IN AEPB: 1.0000 | INHALATION_SPRAY | Freq: Two times a day (BID) | RESPIRATORY_TRACT | 5 refills | Status: DC

## 2022-04-23 NOTE — Telephone Encounter (Signed)
Called and spoke with patient. She verbalized understanding. Advair has been sent to her pharmacy.   Nothing further needed.

## 2022-04-25 ENCOUNTER — Encounter: Payer: Self-pay | Admitting: Hematology and Oncology

## 2022-04-26 ENCOUNTER — Other Ambulatory Visit: Payer: Self-pay | Admitting: Hematology and Oncology

## 2022-04-26 ENCOUNTER — Encounter: Payer: Self-pay | Admitting: Hematology and Oncology

## 2022-04-26 DIAGNOSIS — C5701 Malignant neoplasm of right fallopian tube: Secondary | ICD-10-CM

## 2022-05-04 ENCOUNTER — Inpatient Hospital Stay: Payer: Medicare Other

## 2022-05-04 ENCOUNTER — Inpatient Hospital Stay: Payer: Medicare Other | Attending: Hematology and Oncology

## 2022-05-04 ENCOUNTER — Ambulatory Visit (HOSPITAL_COMMUNITY)
Admission: RE | Admit: 2022-05-04 | Discharge: 2022-05-04 | Disposition: A | Discharge status: Home / Self Care | Payer: Medicare Other | Source: Ambulatory Visit | Attending: Hematology and Oncology | Admitting: Hematology and Oncology

## 2022-05-04 DIAGNOSIS — K449 Diaphragmatic hernia without obstruction or gangrene: Secondary | ICD-10-CM | POA: Insufficient documentation

## 2022-05-04 DIAGNOSIS — I700 Atherosclerosis of aorta: Secondary | ICD-10-CM | POA: Insufficient documentation

## 2022-05-04 DIAGNOSIS — C5701 Malignant neoplasm of right fallopian tube: Secondary | ICD-10-CM

## 2022-05-04 DIAGNOSIS — J90 Pleural effusion, not elsewhere classified: Secondary | ICD-10-CM | POA: Insufficient documentation

## 2022-05-04 DIAGNOSIS — C561 Malignant neoplasm of right ovary: Secondary | ICD-10-CM | POA: Insufficient documentation

## 2022-05-04 DIAGNOSIS — K5909 Other constipation: Secondary | ICD-10-CM | POA: Insufficient documentation

## 2022-05-04 DIAGNOSIS — Z7951 Long term (current) use of inhaled steroids: Secondary | ICD-10-CM | POA: Insufficient documentation

## 2022-05-04 DIAGNOSIS — E669 Obesity, unspecified: Secondary | ICD-10-CM | POA: Insufficient documentation

## 2022-05-04 DIAGNOSIS — Z79899 Other long term (current) drug therapy: Secondary | ICD-10-CM | POA: Insufficient documentation

## 2022-05-04 DIAGNOSIS — C786 Secondary malignant neoplasm of retroperitoneum and peritoneum: Secondary | ICD-10-CM

## 2022-05-04 DIAGNOSIS — Z7984 Long term (current) use of oral hypoglycemic drugs: Secondary | ICD-10-CM | POA: Insufficient documentation

## 2022-05-04 DIAGNOSIS — C762 Malignant neoplasm of abdomen: Secondary | ICD-10-CM

## 2022-05-04 DIAGNOSIS — K5730 Diverticulosis of large intestine without perforation or abscess without bleeding: Secondary | ICD-10-CM | POA: Insufficient documentation

## 2022-05-04 DIAGNOSIS — K439 Ventral hernia without obstruction or gangrene: Secondary | ICD-10-CM | POA: Insufficient documentation

## 2022-05-04 LAB — CBC WITH DIFFERENTIAL (CANCER CENTER ONLY)
Abs Immature Granulocytes: 0.01 10*3/uL (ref 0.00–0.07)
Basophils Absolute: 0 10*3/uL (ref 0.0–0.1)
Basophils Relative: 1 %
Eosinophils Absolute: 0.2 10*3/uL (ref 0.0–0.5)
Eosinophils Relative: 4 %
HCT: 40.2 % (ref 36.0–46.0)
Hemoglobin: 13.3 g/dL (ref 12.0–15.0)
Immature Granulocytes: 0 %
Lymphocytes Relative: 35 %
Lymphs Abs: 1.2 10*3/uL (ref 0.7–4.0)
MCH: 30.5 pg (ref 26.0–34.0)
MCHC: 33.1 g/dL (ref 30.0–36.0)
MCV: 92.2 fL (ref 80.0–100.0)
Monocytes Absolute: 0.3 10*3/uL (ref 0.1–1.0)
Monocytes Relative: 10 %
Neutro Abs: 1.7 10*3/uL (ref 1.7–7.7)
Neutrophils Relative %: 50 %
Platelet Count: 206 10*3/uL (ref 150–400)
RBC: 4.36 MIL/uL (ref 3.87–5.11)
RDW: 13.7 % (ref 11.5–15.5)
WBC Count: 3.4 10*3/uL — ABNORMAL LOW (ref 4.0–10.5)
nRBC: 0 % (ref 0.0–0.2)

## 2022-05-04 LAB — CMP (CANCER CENTER ONLY)
ALT: 15 U/L (ref 0–44)
AST: 17 U/L (ref 15–41)
Albumin: 4.4 g/dL (ref 3.5–5.0)
Alkaline Phosphatase: 67 U/L (ref 38–126)
Anion gap: 7 (ref 5–15)
BUN: 11 mg/dL (ref 8–23)
CO2: 31 mmol/L (ref 22–32)
Calcium: 9.1 mg/dL (ref 8.9–10.3)
Chloride: 103 mmol/L (ref 98–111)
Creatinine: 0.66 mg/dL (ref 0.44–1.00)
GFR, Estimated: >60 mL/min (ref >60)
Glucose, Bld: 94 mg/dL (ref 70–99)
Potassium: 3.9 mmol/L (ref 3.5–5.1)
Sodium: 141 mmol/L (ref 135–145)
Total Bilirubin: 0.7 mg/dL (ref 0.3–1.2)
Total Protein: 7.1 g/dL (ref 6.5–8.1)

## 2022-05-04 MED ORDER — IOHEXOL 9 MG/ML PO SOLN: 1000.0000 mL | Freq: Once | ORAL | Status: DC

## 2022-05-04 MED ORDER — IOHEXOL 300 MG/ML  SOLN
100.0000 mL | Freq: Once | INTRAMUSCULAR | Status: AC | PRN
  Administered 2022-05-04: 100 mL via INTRAVENOUS

## 2022-05-04 MED ORDER — SODIUM CHLORIDE (PF) 0.9 % IJ SOLN
INTRAMUSCULAR | Status: AC
  Filled 2022-05-04: qty 50

## 2022-05-05 LAB — CA 125: Cancer Antigen (CA) 125: 3.4 U/mL (ref 0.0–38.1)

## 2022-05-06 ENCOUNTER — Encounter: Payer: Self-pay | Admitting: Hematology and Oncology

## 2022-05-06 ENCOUNTER — Inpatient Hospital Stay (HOSPITAL_BASED_OUTPATIENT_CLINIC_OR_DEPARTMENT_OTHER): Payer: Medicare Other | Admitting: Hematology and Oncology

## 2022-05-06 VITALS — BP 143/58 | HR 60 | Temp 97.5°F | Resp 18 | Ht 65.0 in | Wt 238.4 lb

## 2022-05-06 DIAGNOSIS — E669 Obesity, unspecified: Secondary | ICD-10-CM | POA: Diagnosis not present

## 2022-05-06 DIAGNOSIS — K449 Diaphragmatic hernia without obstruction or gangrene: Secondary | ICD-10-CM | POA: Diagnosis not present

## 2022-05-06 DIAGNOSIS — K5909 Other constipation: Secondary | ICD-10-CM | POA: Diagnosis not present

## 2022-05-06 DIAGNOSIS — Z79899 Other long term (current) drug therapy: Secondary | ICD-10-CM | POA: Diagnosis not present

## 2022-05-06 DIAGNOSIS — E66812 Obesity, class 2: Secondary | ICD-10-CM

## 2022-05-06 DIAGNOSIS — C5701 Malignant neoplasm of right fallopian tube: Secondary | ICD-10-CM

## 2022-05-06 DIAGNOSIS — K5730 Diverticulosis of large intestine without perforation or abscess without bleeding: Secondary | ICD-10-CM | POA: Diagnosis not present

## 2022-05-06 DIAGNOSIS — Z7984 Long term (current) use of oral hypoglycemic drugs: Secondary | ICD-10-CM | POA: Diagnosis not present

## 2022-05-06 DIAGNOSIS — C786 Secondary malignant neoplasm of retroperitoneum and peritoneum: Secondary | ICD-10-CM | POA: Diagnosis not present

## 2022-05-06 DIAGNOSIS — C561 Malignant neoplasm of right ovary: Secondary | ICD-10-CM | POA: Diagnosis not present

## 2022-05-06 DIAGNOSIS — K439 Ventral hernia without obstruction or gangrene: Secondary | ICD-10-CM | POA: Diagnosis not present

## 2022-05-06 DIAGNOSIS — J90 Pleural effusion, not elsewhere classified: Secondary | ICD-10-CM | POA: Diagnosis not present

## 2022-05-06 DIAGNOSIS — I700 Atherosclerosis of aorta: Secondary | ICD-10-CM | POA: Diagnosis not present

## 2022-05-06 DIAGNOSIS — Z7951 Long term (current) use of inhaled steroids: Secondary | ICD-10-CM | POA: Diagnosis not present

## 2022-05-06 NOTE — Assessment & Plan Note (Signed)
The patient is highly motivated and has a dietitian Recommend addition of strength training exercises in addition to her walking and swimming

## 2022-05-06 NOTE — Assessment & Plan Note (Signed)
It appears that she has developed a mild abdominal wall hernia We discussed importance of weight loss and exercise and the utility of wearing a binder

## 2022-05-06 NOTE — Assessment & Plan Note (Signed)
We reviewed CT imaging We discussed the role of tumor marker monitoring She does not need repeat imaging study for another 6 months Recommend return visit in 3 months  I also recommend GYN follow-up for pelvic exam and monitoring

## 2022-05-06 NOTE — Assessment & Plan Note (Signed)
Symptoms of abdominal bloating and distention is due to severe chronic constipation I recommend the patient to reduce fiber supplement and to start taking laxatives

## 2022-05-06 NOTE — Progress Notes (Signed)
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[facility]) We reviewed CT imaging We discussed the role of tumor marker monitoring She does not need repeat imaging study for another 6 months Recommend return visit in 3 months  I also recommend GYN follow-up for pelvic exam and monitoring  Other constipation Symptoms of abdominal bloating and distention is due to severe chronic constipation I recommend the patient to reduce fiber supplement and to start taking laxatives  Hernia of abdominal wall It appears that she has developed a mild abdominal wall hernia We discussed importance of weight loss and exercise and the utility of wearing a binder  Obesity, Class II, BMI 35-39.9 The patient is highly motivated and has a dietitian Recommend addition of strength training exercises in addition to her walking and swimming  Orders Placed This Encounter  Procedures   CBC with Differential/Platelet    Standing Status:   Standing    Number of Occurrences:   22    Standing Expiration Date:   05/07/2023   Comprehensive metabolic panel    Standing Status:   Standing    Number of Occurrences:   33    Standing Expiration Date:   05/07/2023    All questions were answered. The patient knows to call the clinic with any problems, questions or concerns. The total time spent in the appointment was 30 minutes encounter with patients including review of chart and various tests results, discussions about plan of care and coordination of care plan   Brittany Lark, MD 05/06/2022 12:05 PM  INTERVAL HISTORY: Please see below for problem oriented charting. she returns for surveillance follow-up for history of fallopian tube cancer She has concerns due  to altered bowel habits She is not having regular bowel movement.  She has not been taking any regular laxative such as MiraLAX and Senokot recently She brought with her several bottles of supplement that was prescribed by her dietitian She is physically active but is disappointed to see that her weight has not changed Denies nausea She has great strength and exercise on a regular basis including walking and aerobic swimming activity She is enjoying great quality of life  REVIEW OF SYSTEMS:   Constitutional: Denies fevers, chills or abnormal weight loss Eyes: Denies blurriness of vision Ears, nose, mouth, throat, and face: Denies mucositis or sore throat Respiratory: Denies cough, dyspnea or wheezes Cardiovascular: Denies palpitation, chest discomfort or lower extremity swelling Skin: Denies abnormal skin rashes Lymphatics: Denies new lymphadenopathy or easy bruising Neurological:Denies numbness, tingling or new weaknesses Behavioral/Psych: Mood is stable, no new changes  All other systems were reviewed with the patient and are negative.  I have reviewed the past medical history, past surgical history, social history and family history with the patient and they are unchanged from previous note.  ALLERGIES:  is allergic to macrobid [nitrofurantoin], ciprofloxacin, codeine, and azithromycin.  MEDICATIONS:  Current Outpatient Medications  Medication Sig Dispense Refill   fluticasone-salmeterol (ADVAIR DISKUS) 100-50 MCG/ACT AEPB Inhale 1 puff into the lungs 2 (two) times daily. 1 each 5   acetaminophen (TYLENOL) 500 MG tablet Take 1,000 mg by mouth every 6 (six) hours as needed for mild pain, moderate pain, fever or headache.     albuterol (PROVENTIL) (2.5 MG/3ML) 0.083% nebulizer solution Take 2.5  mg by nebulization every 6 (six) hours as needed for wheezing or shortness of breath.     albuterol (VENTOLIN HFA) 108 (90 Base) MCG/ACT inhaler Inhale 2 puffs into the lungs every 6 (six) hours  as needed for wheezing or shortness of breath. 8 g 6   Ascorbic Acid (VITAMIN C PO) Take by mouth.     atorvastatin (LIPITOR) 20 MG tablet Take 20 mg by mouth daily.     Cholecalciferol (VITAMIN D-3) 25 MCG (1000 UT) CAPS Take 1,000 Units by mouth daily.     co-enzyme Q-10 30 MG capsule Take by mouth.     estradiol (ESTRACE VAGINAL) 0.1 MG/GM vaginal cream Place 1 Applicatorful vaginally at bedtime. 42.5 g 12   fluticasone (FLOVENT HFA) 110 MCG/ACT inhaler Inhale 2 puffs into the lungs 2 (two) times daily as needed. 1 each 12   hydrocortisone 2.5 % cream Apply topically.     loratadine (CLARITIN) 10 MG tablet Take 10 mg by mouth daily.     losartan (COZAAR) 50 MG tablet Take 50 mg by mouth daily.     Melatonin 5 MG CHEW Chew 20 mg by mouth at bedtime.     metFORMIN (GLUCOPHAGE) 500 MG tablet Take 500 mg by mouth 2 (two) times daily.     Multiple Vitamins-Minerals (MULTIVITAMIN WITH MINERALS) tablet Take 1 tablet by mouth daily.     pantoprazole (PROTONIX) 40 MG tablet Take 1 tablet (40 mg total) by mouth daily. 30 tablet 1   polyethylene glycol (MIRALAX / GLYCOLAX) 17 g packet Take 8.5 g by mouth 2 (two) times daily.     Probiotic Product (PROBIOTIC BLEND PO) Take by mouth.     sennosides-docusate sodium (SENOKOT-S) 8.6-50 MG tablet Take 1 tablet by mouth 2 (two) times daily as needed for constipation.     No current facility-administered medications for this visit.    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 - Diverticulosis in the sigmoid colon and in the descending colon. - No specimens collected. - Blood in stool without cause found on endoscopic exam   05/08/2020 Imaging   1. Bladder is decompressed though demonstrates significant perivesicular hazy stranding, mucosal hyperemia and edematous mural thickening. Findings are suggestive of cystitis. Correlate with urinalysis. No abnormal perinephric or periureteral stranding or other features to suggest an ascending tract infection at this time. 2. Colonic diverticulosis without evidence of acute diverticulitis. 3. Aortic Atherosclerosis (ICD10-I70.0).   12/28/2020 Imaging   1. 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 Differential for peritoneal carcinomatosis is primarily neoplastic and includes cancers of the ovary, appendix, colon, pancreas and stomach. PET-CT may be helpful for further characterization. Tissue sampling of the mesentery may be eventually required for diagnosis. 2. Mildly distended small bowel loops throughout the abdomen and pelvis, with fluid and associated air-fluid levels throughout the nondistended small bowel, suggesting a mild ileus versus partial small bowel obstruction. Favor partial small bowel obstruction with transition zone in the  RIGHT lower quadrant, likely related to aforementioned neoplastic peritoneal implants and/or adhesions. 3. Small free fluid within the RIGHT upper quadrant and LEFT upper quadrant. No abscess collection seen. No free intraperitoneal air seen. 4. Extensive colonic diverticulosis without evidence of acute diverticulitis. 5. Small chronic pleural effusions with associated atelectasis.   12/29/2020 Tumor Marker   Patient's tumor was tested for the following markers: CA-125. Results of the tumor marker test revealed 95.1.    12/30/2020 Surgery   Preoperative diagnosis: abdominal nodules   Postoperative diagnosis: same   Procedure: diagnostic laparoscopy with abdominal wall biopsy   Surgeon: Gurney Maxin, M.D.    Indications for procedure: Brittany Middleton is a 71 y.o. year old female with symptoms of nausea, vomiting, and abdominal pain. Work up was concerning for cancer with abdominal wall studding.   Description of procedure: The patient was brought into the operative suite. Anesthesia was administered with General endotracheal anesthesia. WHO checklist was applied. The patient was then placed in supine position. The area was prepped and draped in the usual sterile fashion.   A small left subcostal incision was made. A 102m trocar was used to gain access to the peritoneal cavity by optical entry technique. Pneumoperitoneum was applied with a high flow and low pressure. The laparoscope was reinserted to confirm position.   On initial visualization of the abdomen, there were multiple nodules throughout the abdominal wall. There were multiple nodules over the small intestine and mesentery. There was some loops of small intestine that were matted together. There was a large white mass in the left upper quadrant. Multiple areas of peritoneum were removed with harmonic scalpel. The large mass was inspected. It appeared to be related to the colon and omentum. Grasper was used to remove some superficial tissue and was sent for culture.   The abdomen was desufflated. The incisions were closed with 4-0 monocryl subcuticular stitch.   12/30/2020 Pathology Results   FINAL MICROSCOPIC DIAGNOSIS:   A. ABDOMINAL WALL, NODULE, EXCISION:  -  Metastatic adenocarcinoma  -  See comment   B. PERI COLONIC EXUDATE, EXCISION:  -  Metastatic adenocarcinoma  -  See comment   COMMENT:   By immunohistochemistry, the neoplastic cells are positive for cytokeratin 7, PAX8 and WT1 but negative for cytokeratin 20, CDX2, p53 and  TTF-1.  The morphology and immunophenotype are consistent with a gynecologic primary.     01/01/2021 Initial Diagnosis   Peritoneal carcinomatosis ([place]   01/08/2021 Cancer Staging   Staging form: Ovary, Fallopian Tube, and Primary Peritoneal Carcinoma, AJCC 8th Edition - Clinical stage from 01/08/2021: cT3, cN0, cM0 - Signed by GHeath Lark MD on 01/08/2021 Stage prefix: Initial diagnosis   01/12/2021 - 03/11/2021 Chemotherapy   Patient is on Treatment Plan : OVARIAN Carboplatin (AUC 6) / Paclitaxel (175) q21d x 6 cycles     01/17/2021 Imaging   MRI brain No evidence of intracranial metastatic disease or other acute abnormality   02/12/2021 Genetic Testing   No pathogenic variants detected in [facility].  The report date is February 12, 2021.   The [facility] panel offered by [facility] [employer] sequencing and deletion/duplication testing of the following 48 genes: APC, ATM, AXIN2, BAP1, BARD1, BMPR1A, BRCA1, BRCA2, BRIP1, CHD1, CDK4, CDKN2A(p16 and p14ARF), CHEK2, CTNNA1, EGFR, EPCAM, FH, FLCN, GREM1, HOXB13, MEN1, MET, MITF , MLH1, MSH2, MSH3, MSH6, MUTYH, NTHL1, PALB2, PMS2, POLD1, POLE, PTEN, RAD51C, RAD51D, RET, SDHA, SDHB, SDHC, SDHD, SMAD4, STK11,TERT, TP53, TSC1, TSC2, and VHL.  Unless otherwise specified, all coding regions and flanking non-coding regions are analyzed for sequence variation. Analysis of flanking intronic regions typically do not extend more than 20 bp before and 10 bp after each exon, though the exact region may be adjusted based on the presence of either potentially significant variants or highly repetitive sequences. Coding regions and proximal promoter regions near the transcription start sites are analyzed for large deletions or duplications. Specific genes are tested only for sequence and/or CNVs within limited regions. Limited clinically relevant regions are included for EGFR (sequencing and CNV analysis of exons 18-21),RET (sequencing  and CNV analysis of exons 5, 8, 10, 11, and 13-16), and MITF (sequencing of position c.952). Only CNV analysis of the last two exons of EPCAM is performed. CNV analysis of GREM1 includes the upstream region overlapping the adjacent gene SCG5. MSH3 exon 1 contains a long polyalanine repeat that can interfere with variant calling; therefore, MSH3 analysis excludes c.121 to c.237. Only sequence analysis of the exons encompassing the exonuclease domains of these genes is performed (POLD1 c.841 to c.1686, POLE c.802 to c.1473). Limited promoter regions in selected genes undergo sequence analysis including TERT (c.-71 to c.-1), and APC Promoter 1B (c.-195 to c.-190 and c.-125 ([id number]).   HRD testing pending.    03/02/2021 Imaging   Outside imaging at [facility] improved peritoneal carcinomatosis. No evidence of new metastatic disease or acute abdominal pathology.    03/02/2021 Imaging   Outside CT imaging done at [facility] improved peritoneal carcinomatosis. No evidence of new metastatic disease or acute abdominal pathology   03/02/2021 - 03/03/2021 Hospital Admission   She was hospitalized briefly for evaluation of hematochezia at [facility].  She underwent CT imaging and colonoscopy and was subsequently discharged   03/03/2021 Procedure   Colonoscopy was performed at [facility]  Findings: Diverticula were found in the entire colon. Internal hemorrhoids were found during retroflexion.  The hemorrhoids were Grade I (internal hemorrhoids that do not prolapse). Impression: - Diverticulosis in the entire examined colon. Likely source of bleeding. - Internal hemorrhoids.   04/07/2021 Surgery   PROCEDURES:  Exam under anesthesia Diagnostic laparoscopy Exploratory laparotomy Lysis of adhesions (>30 minutes) Infragastric omentectomy Total abdominal hysterectomy and bilateral salpingo-oophorectomy Optimal (R0) interval tumor debulking  SURGEON: Surgeon(s) and Role: Mellody Drown, MD -  Primary * Salinaro, Whitney Muse, MD - Resident - Assisting * Riki Sheer, Owens Shark, MD - Fellow  INDICATION(S): 71 y.o. who presented with carcinomatosis and mildly elevated CA125 to 95 with biopsy showing mullerian adenocarcinoma, s/p 3 cycles of neoadjuvant chemotherapy with normalization of CA125 and decreased disease burden on imaging.  OPERATIVE FINDINGS:  On exam, no palpable masses. On laparoscopy, omentum thickened and retracted along the transverse colon, pelvic adhesive disease. On laparotomy, omentum thickened, retracted, and densely adherent to the underlying colon mesentery, but without definitive viable tumor. Adhesions of the omentum to the descending colon, rectosigmoid colon to the left adnexa and posterior uterus and cervix, loop of ileum to the right adnexa, and bladder to anterior uterus and cervix. Transverse colon with small diverticulum that was over-sewed. Appendix normal and retrocecal. No grossly visible tumor in the abdomen including smooth diaphragm and liver surface, normal stomach, small bowel, colon, and normal appearing uterus, bilateral fallopian tubes, and bilateral ovaries.  At the conclusion of the case, no gross residual disease remained (R0).    04/07/2021 Pathology Results   Final pathology showed residual high-grade serous carcinoma of the right fallopian tube with STIC lesion, with negative  ovary, negative omentum (with treated tumor), negative uterus, left adnexa (path report error saying negative right adnexa), and washings.   05/22/2021 - 06/19/2021 Chemotherapy   Patient is on Treatment Plan : OVARIAN Carboplatin (AUC 6) / Paclitaxel (175) q21d x 6 cycles      06/19/2021 Tumor Marker   Patient's tumor was tested for the following markers: CA-125. Results of the tumor marker test revealed 3.   07/22/2021 Tumor Marker   Patient's tumor was tested for the following markers: CA-125. Results of the tumor marker test revealed 3.1.   07/23/2021 Imaging    1. No evidence metastatic ovarian carcinoma. 2. No peritoneal nodularity or fluid. 3. Post hysterectomy and oophorectomy       08/19/2021 Procedure   Successful removal of RIGHT chest implanted Port-A-Cath, as above   09/24/2021 Tumor Marker   Patient's tumor was tested for the following markers: CA-125. Results of the tumor marker test revealed 3.7.   01/20/2022 Imaging   1. Stable. No evidence for metastatic disease in the abdomen or pelvis. 2. Tiny gas bubble identified in the bladder lumen. Correlation witfor h recent instrumentation recommended. In the absence of recent instrumentation, bladder infection would be a consideration. 3. Tiny hiatal hernia. 4. Left colonic diverticulosis without diverticulitis. 5. Aortic Atherosclerosis (ICD10-I70.0).   02/01/2022 Tumor Marker   Patient's tumor was tested for the following markers: CA-125. Results of the tumor marker test revealed 3.8.   05/06/2022 Tumor Marker   Patient's tumor was tested for the following markers: CA-125. Results of the tumor marker test revealed 3.4.   05/06/2022 Imaging   1. Stable examination without evidence of local recurrence or metastatic disease within the chest, abdomen, or pelvis. 2. Left-sided colonic diverticulosis without findings of acute diverticulitis. 3. Mosaic attenuation of the lung bases with mild diffuse bronchial wall thickening, findings which can be seen in the setting of small airways disease. 4.  Aortic Atherosclerosis (ICD10-I70.0).     PHYSICAL EXAMINATION: ECOG PERFORMANCE STATUS: 0 - Asymptomatic  Vitals:   05/06/22 1029  BP: (!) 143/58  Pulse: 60  Resp: 18  Temp: (!) 97.5 F (36.4 C)  SpO2: 96%   Filed Weights   05/06/22 1029  Weight: 238 lb 6.4 oz (108.1 kg)    GENERAL:alert, no distress and comfortable  NEURO: alert & oriented x 3 with fluent speech, no focal motor/sensory deficits  LABORATORY DATA:  I have reviewed the data as listed    Component Value Date/Time    NA 141 05/04/2022 1337   NA 143 12/20/2019 0829   K 3.9 05/04/2022 1337   CL 103 05/04/2022 1337   CO2 31 05/04/2022 1337   GLUCOSE 94 05/04/2022 1337   BUN 11 05/04/2022 1337   BUN 16 12/20/2019 0829   CREATININE 0.66 05/04/2022 1337   CALCIUM 9.1 05/04/2022 1337   PROT 7.1 05/04/2022 1337   PROT 6.4 02/04/2020 1036   ALBUMIN 4.4 05/04/2022 1337   ALBUMIN 4.2 02/04/2020 1036   AST 17 05/04/2022 1337   ALT 15 05/04/2022 1337   ALKPHOS 67 05/04/2022 1337   BILITOT 0.7 05/04/2022 1337   GFRNONAA >60 05/04/2022 1337   GFRAA 104 12/20/2019 0829    No results found for: "SPEP", "UPEP"  Lab Results  Component Value Date   WBC 3.4 (L) 05/04/2022   NEUTROABS 1.7 05/04/2022   HGB 13.3 05/04/2022   HCT 40.2 05/04/2022   MCV 92.2 05/04/2022   PLT 206 05/04/2022      Chemistry  Component Value Date/Time   NA 141 05/04/2022 1337   NA 143 12/20/2019 0829   K 3.9 05/04/2022 1337   CL 103 05/04/2022 1337   CO2 31 05/04/2022 1337   BUN 11 05/04/2022 1337   BUN 16 12/20/2019 0829   CREATININE 0.66 05/04/2022 1337      Component Value Date/Time   CALCIUM 9.1 05/04/2022 1337   ALKPHOS 67 05/04/2022 1337   AST 17 05/04/2022 1337   ALT 15 05/04/2022 1337   BILITOT 0.7 05/04/2022 1337       RADIOGRAPHIC STUDIES: I have reviewed CT imaging with the patient I have personally reviewed the radiological images as listed and agreed with the findings in the report. CT CHEST ABDOMEN PELVIS W CONTRAST  Result Date: 05/04/2022 CLINICAL DATA:  History of adenocarcinoma of the right fallopian tube. Monitor/surveillance. * Tracking Code: BO * EXAM: CT CHEST, ABDOMEN, AND PELVIS WITH CONTRAST TECHNIQUE: Multidetector CT imaging of the chest, abdomen and pelvis was performed following the standard protocol during bolus administration of intravenous contrast. RADIATION DOSE REDUCTION: This exam was performed according to the departmental dose-optimization program which includes automated  exposure control, adjustment of the mA and/or kV according to patient size and/or use of iterative reconstruction technique. CONTRAST:  159m OMNIPAQUE IOHEXOL 300 MG/ML  SOLN COMPARISON:  Multiple priors including CT January 20, 2022 and July 21, 2021 FINDINGS: CT CHEST FINDINGS Cardiovascular: Interval removal of the Port-A-Cath catheter tube. Normal caliber thoracic aorta. Aortic atherosclerosis. No central pulmonary embolus on this nondedicated study. Coronary artery calcifications. Normal size heart. No significant pericardial effusion/thickening. Mediastinum/Nodes: No supraclavicular adenopathy. No suspicious thyroid nodule. No pathologically enlarged mediastinal, hilar or axillary lymph nodes. The esophagus is grossly unremarkable. Lungs/Pleura: Scattered subsegmental atelectasis versus scarring. Mild diffuse bronchial wall thickening. Mosaic attenuation of the lung bases. No suspicious pulmonary nodules or masses. No pleural effusion.  No pneumothorax. Musculoskeletal: No aggressive lytic or blastic lesion of bone. Multilevel degenerative changes spine. CT ABDOMEN PELVIS FINDINGS Hepatobiliary: No suspicious hepatic lesion. Gallbladder is unremarkable. No biliary ductal dilation. Pancreas: No pancreatic ductal dilation or evidence of acute inflammation. Spleen: No splenomegaly. Adrenals/Urinary Tract: Bilateral adrenal glands appear normal. No hydronephrosis. Kidneys demonstrate symmetric enhancement and excretion of contrast material. Urinary bladder is unremarkable for degree of distension. Stomach/Bowel: Tiny hiatal hernia. Radiopaque enteric contrast material traverses the ileocecal valve. No pathologic dilation of small or large bowel. Normal appendix. Left-sided colonic diverticulosis without findings of acute diverticulitis. No evidence of acute bowel inflammation. Vascular/Lymphatic: Normal caliber abdominal aorta. No pathologically enlarged abdominal or pelvic lymph nodes. Reproductive: Status post  hysterectomy no enhancing soft tissue nodularity along the vaginal cuff. No suspicious adnexal masses. Other: No significant abdominopelvic free fluid. No discrete peritoneal or omental nodularity. Postsurgical change in the anterior abdominal wall. Anterior abdominal wall laxity. Musculoskeletal: No aggressive lytic or blastic lesion of bone. Multilevel degenerative changes spine. Chronic osseous changes of the pubic symphysis. IMPRESSION: 1. Stable examination without evidence of local recurrence or metastatic disease within the chest, abdomen, or pelvis. 2. Left-sided colonic diverticulosis without findings of acute diverticulitis. 3. Mosaic attenuation of the lung bases with mild diffuse bronchial wall thickening, findings which can be seen in the setting of small airways disease. 4.  Aortic Atherosclerosis (ICD10-I70.0). Electronically Signed   By: JDahlia BailiffM.D.   On: 05/04/2022 17:56

## 2022-06-07 ENCOUNTER — Emergency Department (HOSPITAL_BASED_OUTPATIENT_CLINIC_OR_DEPARTMENT_OTHER)
Admission: EM | Admit: 2022-06-07 | Discharge: 2022-06-07 | Disposition: A | Discharge status: Home / Self Care | Payer: Medicare Other | Attending: Emergency Medicine | Admitting: Emergency Medicine

## 2022-06-07 ENCOUNTER — Encounter (HOSPITAL_BASED_OUTPATIENT_CLINIC_OR_DEPARTMENT_OTHER): Payer: Self-pay | Admitting: Radiology

## 2022-06-07 ENCOUNTER — Emergency Department (HOSPITAL_BASED_OUTPATIENT_CLINIC_OR_DEPARTMENT_OTHER): Payer: Medicare Other

## 2022-06-07 ENCOUNTER — Other Ambulatory Visit: Payer: Self-pay

## 2022-06-07 DIAGNOSIS — Z79899 Other long term (current) drug therapy: Secondary | ICD-10-CM | POA: Insufficient documentation

## 2022-06-07 DIAGNOSIS — Z8543 Personal history of malignant neoplasm of ovary: Secondary | ICD-10-CM | POA: Diagnosis not present

## 2022-06-07 DIAGNOSIS — Z7951 Long term (current) use of inhaled steroids: Secondary | ICD-10-CM | POA: Diagnosis not present

## 2022-06-07 DIAGNOSIS — R0602 Shortness of breath: Secondary | ICD-10-CM | POA: Insufficient documentation

## 2022-06-07 DIAGNOSIS — I10 Essential (primary) hypertension: Secondary | ICD-10-CM | POA: Insufficient documentation

## 2022-06-07 DIAGNOSIS — J45909 Unspecified asthma, uncomplicated: Secondary | ICD-10-CM | POA: Insufficient documentation

## 2022-06-07 DIAGNOSIS — R079 Chest pain, unspecified: Secondary | ICD-10-CM | POA: Diagnosis present

## 2022-06-07 DIAGNOSIS — R001 Bradycardia, unspecified: Secondary | ICD-10-CM | POA: Diagnosis not present

## 2022-06-07 DIAGNOSIS — I2510 Atherosclerotic heart disease of native coronary artery without angina pectoris: Secondary | ICD-10-CM | POA: Insufficient documentation

## 2022-06-07 LAB — CBC
HCT: 40.6 % (ref 36.0–46.0)
Hemoglobin: 12.9 g/dL (ref 12.0–15.0)
MCH: 29.3 pg (ref 26.0–34.0)
MCHC: 31.8 g/dL (ref 30.0–36.0)
MCV: 92.1 fL (ref 80.0–100.0)
Platelets: 213 10*3/uL (ref 150–400)
RBC: 4.41 MIL/uL (ref 3.87–5.11)
RDW: 14 % (ref 11.5–15.5)
WBC: 4.1 10*3/uL (ref 4.0–10.5)
nRBC: 0 % (ref 0.0–0.2)

## 2022-06-07 LAB — BASIC METABOLIC PANEL
Anion gap: 10 (ref 5–15)
BUN: 14 mg/dL (ref 8–23)
CO2: 27 mmol/L (ref 22–32)
Calcium: 9.2 mg/dL (ref 8.9–10.3)
Chloride: 104 mmol/L (ref 98–111)
Creatinine, Ser: 0.6 mg/dL (ref 0.44–1.00)
GFR, Estimated: >60 mL/min (ref >60)
Glucose, Bld: 105 mg/dL — ABNORMAL HIGH (ref 70–99)
Potassium: 3.7 mmol/L (ref 3.5–5.1)
Sodium: 141 mmol/L (ref 135–145)

## 2022-06-07 LAB — TROPONIN I (HIGH SENSITIVITY)
Troponin I (High Sensitivity): 4 ng/L (ref <18)
Troponin I (High Sensitivity): 4 ng/L (ref <18)

## 2022-06-07 LAB — BRAIN NATRIURETIC PEPTIDE: B Natriuretic Peptide: 44.8 pg/mL (ref 0.0–100.0)

## 2022-06-07 MED ORDER — IOHEXOL 350 MG/ML SOLN
100.0000 mL | Freq: Once | INTRAVENOUS | Status: AC | PRN
  Administered 2022-06-07: 125 mL via INTRAVENOUS

## 2022-06-07 NOTE — ED Triage Notes (Signed)
Patient presents to ED via POV from home. Here with chest pain. Reports 3 days she had a pain in her left arm. Pain went away on its own. This morning patient had chest pain. Endorses nausea and mild shortness of breath.

## 2022-06-07 NOTE — ED Provider Notes (Signed)
[facility] Provider Note   CSN: [id number] Arrival date & time: 06/07/22  1048     History  Chief Complaint  Patient presents with   Chest Pain    Brittany Middleton is a 72 y.o. female with a past medical history significant for RCA coronary aneurysm, history of GI bleed, asthma, CAD, hyperlipidemia, hypertension, and history of ovarian cancer currently in remission who presents to the ED due to chest pain that woke her from her sleep this morning.  Patient admits to left upper extremity pain 2 nights ago.  She admits to chronic neck pain causing intermittent nerve impingement of the left upper extremity however, patient states this arm pain felt different.  Patient states this morning she was awoken from her sleep due to chest pain and left arm pain.  Patient states chest pain is worse with deep inspiration.  She notes 1 week ago she had pain behind her left knee which resolved the following morning. No history of blood clots. Patient notes she went to water aerobics today to see if her chest pain would improve which it did; however she developed upper back pain. Admits to some shortness of breath. States she feels swollen all over (hands, legs, face).   History obtained from patient and past medical records. No interpreter used during encounter.       Home Medications Prior to Admission medications   Medication Sig Start Date End Date Taking? Authorizing Provider  fluticasone-salmeterol (ADVAIR DISKUS) 100-50 MCG/ACT AEPB Inhale 1 puff into the lungs 2 (two) times daily. 04/23/22   Freddi Starr, MD  acetaminophen (TYLENOL) 500 MG tablet Take 1,000 mg by mouth every 6 (six) hours as needed for mild pain, moderate pain, fever or headache.    [provider]  albuterol (PROVENTIL) (2.5 MG/3ML) 0.083% nebulizer solution Take 2.5 mg by nebulization every 6 (six) hours as needed for wheezing or shortness of breath.    [provider]   albuterol (VENTOLIN HFA) 108 (90 Base) MCG/ACT inhaler Inhale 2 puffs into the lungs every 6 (six) hours as needed for wheezing or shortness of breath. 11/04/21   Freddi Starr, MD  Ascorbic Acid (VITAMIN C PO) Take by mouth.    [provider]  atorvastatin (LIPITOR) 20 MG tablet Take 20 mg by mouth daily.    [provider]  Cholecalciferol (VITAMIN D-3) 25 MCG (1000 UT) CAPS Take 1,000 Units by mouth daily.    [provider]  co-enzyme Q-10 30 MG capsule Take by mouth. 09/17/21   [provider]  estradiol (ESTRACE VAGINAL) 0.1 MG/GM vaginal cream Place 1 Applicatorful vaginally at bedtime. 01/28/22   [name], MD  fluticasone (FLOVENT HFA) 110 MCG/ACT inhaler Inhale 2 puffs into the lungs 2 (two) times daily as needed. 11/04/21   Freddi Starr, MD  hydrocortisone 2.5 % cream Apply topically. 10/14/21   [provider]  loratadine (CLARITIN) 10 MG tablet Take 10 mg by mouth daily.    [provider]  losartan (COZAAR) 50 MG tablet Take 50 mg by mouth daily. 10/02/21   [provider]  Melatonin 5 MG CHEW Chew 20 mg by mouth at bedtime.    [provider]  metFORMIN (GLUCOPHAGE) 500 MG tablet Take 500 mg by mouth 2 (two) times daily. 10/03/21   [provider]  Multiple Vitamins-Minerals (MULTIVITAMIN WITH MINERALS) tablet Take 1 tablet by mouth daily.    [provider]  pantoprazole (PROTONIX) 40  MG tablet Take 1 tablet (40 mg total) by mouth daily. 04/01/20   Regalado, Belkys A, MD  polyethylene glycol (MIRALAX / GLYCOLAX) 17 g packet Take 8.5 g by mouth 2 (two) times daily.    [provider]  Probiotic Product (PROBIOTIC BLEND PO) Take by mouth.    [provider]  sennosides-docusate sodium (SENOKOT-S) 8.6-50 MG tablet Take 1 tablet by mouth 2 (two) times daily as needed for constipation.    [provider]      Allergies    Macrobid [nitrofurantoin],  Ciprofloxacin, Codeine, and Azithromycin    Review of Systems   Review of Systems  Constitutional:  Negative for chills and fever.  Respiratory:  Positive for shortness of breath.   Cardiovascular:  Positive for chest pain and leg swelling.    Physical Exam Updated Vital Signs BP 135/61   Pulse (!) 56   Temp 98.4 F (36.9 C) (Oral)   Resp (!) 22   Ht '5\' 5"'$  (1.651 m)   Wt 107.5 kg   SpO2 97%   BMI 39.44 kg/m  Physical Exam Vitals and nursing note reviewed.  Constitutional:      General: She is not in acute distress.    Appearance: She is not ill-appearing.  HENT:     Head: Normocephalic.     Comments: No obvious facial edema Eyes:     Pupils: Pupils are equal, round, and reactive to light.  Cardiovascular:     Rate and Rhythm: Normal rate and regular rhythm.     Pulses: Normal pulses.     Heart sounds: Normal heart sounds. No murmur heard.    No friction rub. No gallop.  Pulmonary:     Effort: Pulmonary effort is normal.     Breath sounds: Normal breath sounds.  Abdominal:     General: Abdomen is flat. There is no distension.     Palpations: Abdomen is soft.     Tenderness: There is no abdominal tenderness. There is no guarding or rebound.  Musculoskeletal:        General: Normal range of motion.     Cervical back: Neck supple.     Comments: No lower extremity edema  Skin:    General: Skin is warm and dry.  Neurological:     General: No focal deficit present.     Mental Status: She is alert.  Psychiatric:        Mood and Affect: Mood normal.        Behavior: Behavior normal.     ED Results / Procedures / Treatments   Labs (all labs ordered are listed, but only abnormal results are displayed) Labs Reviewed  BASIC METABOLIC PANEL - Abnormal; Notable for the following components:      Result Value   Glucose, Bld 105 (*)    All other components within normal limits  CBC  BRAIN NATRIURETIC PEPTIDE  TROPONIN I (HIGH SENSITIVITY)  TROPONIN I (HIGH  SENSITIVITY)    EKG EKG Interpretation  Date/Time:  Monday June 07 2022 11:00:58 EST Ventricular Rate:  58 PR Interval:  152 QRS Duration: 84 QT Interval:  406 QTC Calculation: 398 R Axis:   102 Text Interpretation: Sinus bradycardia Rightward axis Low voltage QRS Borderline ECG When compared with ECG of 21-May-2020 16:06, No significant change since last tracing Confirmed by Garnette Gunner [phone]) on 06/07/2022 12:52:33 PM  Radiology CT Angio Chest/Abd/Pel for Dissection W and/or Wo Contrast  Result Date: 06/07/2022 CLINICAL DATA:  Chest pain with  pain between the scapulae, left axillary pain since yesterday. EXAM: CT ANGIOGRAPHY CHEST, ABDOMEN AND PELVIS TECHNIQUE: Non-contrast CT of the chest was initially obtained. Multidetector CT imaging through the chest, abdomen and pelvis was performed using the standard protocol during bolus administration of intravenous contrast. Multiplanar reconstructed images and MIPs were obtained and reviewed to evaluate the vascular anatomy. RADIATION DOSE REDUCTION: This exam was performed according to the departmental dose-optimization program which includes automated exposure control, adjustment of the mA and/or kV according to patient size and/or use of iterative reconstruction technique. CONTRAST:  195m OMNIPAQUE IOHEXOL 350 MG/ML SOLN COMPARISON:  Multiple priors including most recent CT chest abdomen pelvis dated May 04, 2022 FINDINGS: CTA CHEST FINDINGS Cardiovascular: Noncontrast enhanced sequence demonstrates aortic atherosclerosis without intramural hematoma. Preferential opacification of the thoracic aorta postcontrast administration without evidence of thoracic aortic aneurysm or dissection. Aberrant right subclavian artery, anatomic variant. No central pulmonary embolus. Normal heart size. No pericardial effusion. Mediastinum/Nodes: No suspicious thyroid nodule. No pathologically enlarged mediastinal, hilar or axillary lymph nodes. The  esophagus is grossly unremarkable. Lungs/Pleura: No suspicious pulmonary nodules or masses. No pleural effusion. No pneumothorax. Hypoventilatory change in the lung bases. Musculoskeletal: No acute osseous abnormality. Multilevel degenerative changes of the spine. Degenerative change of the bilateral shoulders. Review of the MIP images confirms the above findings. CTA ABDOMEN AND PELVIS FINDINGS VASCULAR Aorta: Aortic atherosclerosis. Normal caliber aorta without aneurysm, dissection, vasculitis or significant stenosis. Celiac: Patent without evidence of aneurysm, dissection, vasculitis or significant stenosis. SMA: Patent without evidence of aneurysm, dissection, vasculitis or significant stenosis. Renals: Accessory right renal artery, renal arteries are patent without evidence of aneurysm, dissection, vasculitis, fibromuscular dysplasia or significant stenosis. IMA: Patent without evidence of aneurysm, dissection, vasculitis or significant stenosis. Inflow: Patent without evidence of aneurysm, dissection, vasculitis or significant stenosis. Veins: No obvious venous abnormality within the limitations of this arterial phase study. Review of the MIP images confirms the above findings. NON-VASCULAR Hepatobiliary: No suspicious hepatic lesion. Gallbladder is unremarkable. No biliary ductal dilation. Pancreas: No pancreatic ductal dilation or evidence of acute inflammation. Spleen: No splenomegaly or focal splenic lesion. Adrenals/Urinary Tract: Bilateral adrenal glands appear normal. No hydronephrosis. Kidneys demonstrate symmetric enhancement. Urinary bladder is minimally distended limiting evaluation. Stomach/Bowel: No radiopaque enteric contrast material was administered. Stomach is nondistended limiting evaluation. No pathologic dilation of small or large bowel. No evidence of acute bowel inflammation. Moderate volume of formed stool throughout the colon suggestive of constipation. Left-sided colonic diverticulosis  without findings of acute diverticulitis. Lymphatic: No pathologically enlarged abdominal or pelvic lymph nodes. Reproductive: Uterus is surgically absent. No suspicious enhancing soft tissue nodularity along the vaginal cuff. No adnexal mass. Other: No significant abdominopelvic free fluid. Postsurgical change in the ventral abdominal wall. Similar ventral abdominal wall laxity. Musculoskeletal: No acute osseous abnormality. Multilevel degenerative changes spine. Chronic osseous changes of the pubic symphysis. Review of the MIP images confirms the above findings. IMPRESSION: 1. No evidence of aortic aneurysm or dissection. 2. No acute abnormality identified in the chest, abdomen, or pelvis. 3. Moderate volume of formed stool throughout the colon suggestive of constipation. 4. Left-sided colonic diverticulosis without findings of acute diverticulitis. 5.  Aortic Atherosclerosis (ICD10-I70.0). Electronically Signed   By: JDahlia BailiffM.D.   On: 06/07/2022 13:58   DG Chest 2 View  Result Date: 06/07/2022 CLINICAL DATA:  Provided history: Chest pain. EXAM: CHEST - 2 VIEW COMPARISON:  Prior chest radiographs 12/28/2020 and earlier. Chest CT 05/04/2022. FINDINGS: Heart size within normal limits. No appreciable  airspace consolidation. No evidence of pleural effusion or pneumothorax. Degenerative changes of the spine. IMPRESSION: No evidence of acute cardiopulmonary abnormality. Electronically Signed   By: Kellie Simmering D.O.   On: 06/07/2022 11:57    Procedures Procedures    Medications Ordered in ED Medications  iohexol (OMNIPAQUE) 350 MG/ML injection 100 mL (125 mLs Intravenous Contrast Given 06/07/22 1327)    ED Course/ Medical Decision Making/ A&P                           Medical Decision Making Amount and/or Complexity of Data Reviewed External Data Reviewed: notes.    Details: Cardiology note Labs: ordered. Decision-making details documented in ED Course. Radiology: ordered and independent  interpretation performed. Decision-making details documented in ED Course. ECG/medicine tests: ordered and independent interpretation performed. Decision-making details documented in ED Course.  Risk Prescription drug management.   This patient presents to the ED for concern of chest pain, this involves an extensive number of treatment options, and is a complaint that carries with it a high risk of complications and morbidity.  The differential diagnosis includes ACS, PE, dissection, MSK, pneumonia, etc.  71 year old female presents the ED due to left upper extremity pain, chest pain, and back pain.  History of hypertension, hyperlipidemia, CAD, and known coronary artery aneurysm.  Upon arrival, stable vitals.  Patient in no acute distress.  Reassuring physical exam.  No lower extremity edema.  Cardiac labs ordered to rule out ACS.  Dissection study to rule out dissection given chest pain and back pain.   BNP normal.  Doubt CHF.  Troponin flat.  EKG demonstrates sinus bradycardia.  No signs of acute ischemia.  Low suspicion for ACS.  CBC unremarkable.  No leukocytosis and normal hemoglobin.  BMP reassuring.  Normal kidney function.  No major electrolyte derangements.  Chest x-ray personally reviewed and interpreted which is negative for signs of pneumonia, pneumothorax or widened mediastinum.  CTA dissection study negative for dissection.  Does show moderate amount of formed stool.  Patient made aware of findings.  No evidence of large PE. Unexplained etiology of chest pain. Low suspicion for ACS, dissection, or PE. Advised patient to call cardiologist office today to schedule an appointment for further evaluation.   Patient chest pain free upon reassessment. Patient stable for discharge. Strict ED precautions discussed with patient. Patient states understanding and agrees to plan. Patient discharged home in no acute distress and stable vitals.  Discussed with Dr. Truett Mainland who evaluated patient at  bedside who agrees with assessment and plan.        Final Clinical Impression(s) / ED Diagnoses Final diagnoses:  Nonspecific chest pain    Rx / DC Orders ED Discharge Orders     None         Karie Kirks 06/07/22 1512    Cristie Hem, MD 06/07/22 [phone]

## 2022-06-07 NOTE — Discharge Instructions (Addendum)
It was a pleasure taking care of you today.  As discussed, your workup was reassuring.  Your CT scan did not show any evidence of a blood clot or dissection.  It did show some constipation.  Please call your cardiologist today to schedule an appointment for further evaluation of your chest pain.  Return to the ER for new or worsening symptoms.

## 2022-06-07 NOTE — ED Notes (Signed)
Discharge instructions reviewed with patient. Patient verbalizes understanding, no further questions at this time. Medications/prescriptions and follow up information provided. No acute distress noted at time of departure.  

## 2022-06-10 NOTE — Progress Notes (Signed)
Cardiology Office Note:    Date:  06/11/2022   ID:  Brittany Middleton, DOB 06-12-1951, MRN [id number]  PCP:  [name], MD   [facility] Providers Cardiologist:  Jenean Lindau, MD     Referring MD: [name], MD   Chief Complaint: ED follow-up chest pain  History of Present Illness:    Brittany Middleton is a very pleasant 71 y.o. female with a hx of RCA aneurysm, OSA on CPAP, HTN, HLD, family hx early CAD, adenocarcinoma of right fallopian tube, and obesity.  Was referred to cardiology by PCP 02/2018 after being found to have right coronary artery aneurysm on CT after presenting to [facility] with left sided, intermittent, non-pleuritic chest pressure.  She ruled out with serial enzymes and had negative D-dimer. CXR was unremarkable. Coronary CT scan revealed aneurysm at the base of the right coronary artery with recommendation for medical therapy with aspirin and Plavix.  Repeat coronary CTA 06/25/2019 revealed coronary calcium score of 35.2 (68th percentile), normal coronary origin with right dominance, nonobstructive CAD, coronary aneurysm in the RCA unchanged from 02/28/2018.  Last cardiology clinic visit was 03/05/2021 with Dr. Geraldo Pitter. She had recently been diagnosed with abdominal cancer, was planning to undergo surgery and chemotherapy.  She wished to go off of her Plavix which she was advised she could do. She was advised to return in 6 months for follow-up.   She presented to ED on 06/07/2022 for chest pain that woke her up from sleep that morning. Admitted to left upper extremity pain 2 nights previous.  Has chronic neck pain causing intermittent nerve impingement on the left upper extremity however she reports that on this day arm pain felt different.  Was awakened from sleep with chest and left arm pain. Chest pain is worse with deep inspiration. Had pain behind her left knee 1 week prior which resolved the following morning, no history of blood clots. She  went to water aerobics on the day of admission to see if chest pain would improve which it did, however she developed upper back pain and had some shortness of breath.  She reported feeling swollen all over (hands, legs, face). BNP normal, troponin 4. EKG demonstrated sinus bradycardia, low suspicion for ACS.  CXR negative for pneumonia, pneumothorax, widened mediastinum.  CTA negative for dissection. No evidence of large PE. Advised to follow-up as OP.   Today, she is here for follow-up following ED visit described above. Reports left arm discomfort has continued but left arm ache is located along the underside of the arm and then she had some cramping, numbness in her fingers more recently. On the date of the ED visit, discomfort radiated into her axillary. Had pain in mid sternal region of chest that radiated between her shoulder blades. Symptoms awakened her from sleep. She generally feels some discomfort in her chest that does not intensify with movement or exertion. 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 No associated shortness of breath  or n/v. Frequently gets flushed and sweats but no worsening of this with chest pain. Has bulging of abdominal muscles s/p hysterectomy one year ago. Has hiatal hernia but no clear symptoms of acid reflux. Has taken PPI for several years. Has a deep ache behind left knee on occasion, feels better when she puts leg on pillow. No swelling. BP is well controlled at home. Has been taking hydra-based statin in addition to metformin which has been found to reduce cancer cell production per  her research.  No orthopnea, PND, presyncope, syncope. Enjoys reading research articles that are relevant to her health.   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 Last Assessment & Plan:  Formatting of this note might be different from the original. - continue home meds   Family history of breast cancer 01/27/2021   Family history of lung cancer 01/27/2021   Gastrointestinal bleeding 07/25/2018   Gastrointestinal hemorrhage with melena 07/16/2018   Genetic testing 02/13/2021   No pathogenic variants detected in [facility] MyRisk.  The report date is February 12, 2021.   The [facility] gene panel offered by [facility] includes sequencing and deletion/duplication testing of the following 48 genes: APC, ATM, AXIN2, BAP1, BARD1, BMPR1A, BRCA1, BRCA2, BRIP1, CHD1, CDK4, CDKN2A(p16 and p14ARF), CHEK2, CTNNA1, EGFR, EPCAM, FH, FLCN, GREM1, HOXB13, MEN1, M   GERD (gastroesophageal reflux disease)    Hematuria 06/19/2020   Hepatic steatosis    per imaging 2016, 2019   Hiatal hernia    History of palpitations    Holter monitor, 2017: NSR, occasional PVC, PACs, no arrhythmia   HLD (hyperlipidemia) 02/27/2018   HTN (hypertension) 02/27/2018   Hypercholesteremia    Hypercholesterolemia 04/07/2017   Hyperglycemia 06/19/2020   Hypertension    Hypertensive disorder 04/07/2017   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   Hypokalemia 07/17/2018   Lumbar disc disease    MRI, 2017    Obesity, Class III, BMI 40-49.9 (morbid obesity) ([place]) 05/22/2018   Overweight 03/03/2018   Palpitations 08/19/2015   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[place])    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   Physical debility 01/10/2021   Pneumonia    Pure  hypercholesterolemia, unspecified 04/07/2017   Pyelonephritis 03/30/2020   Recurrent UTI 05/28/2020   SBO (small bowel obstruction) ([place]) 12/28/2020   Sepsis secondary to UTI ([place]) 04/25/2020   Severe sepsis ([place]) 05/21/2020   Severe sepsis with acute organ dysfunction ([place]) 04/25/2020   Skin infection 02/05/2021   Skin irritation 02/05/2021   Sleep apnea    Urinary tract infection due to extended-spectrum beta lactamase (ESBL) producing Escherichia coli    Uterine cancer ([place]) 01/01/2021    Past Surgical History:  Procedure Laterality Date   BLADDER REPAIR     BREAST EXCISIONAL BIOPSY Left    CARPAL TUNNEL RELEASE Right    CATARACT EXTRACTION, BILATERAL  2014   COLONOSCOPY WITH PROPOFOL N/A 07/18/2018   Procedure: COLONOSCOPY WITH PROPOFOL;  Surgeon: Laurence Spates, MD;  Location: [place];  Service: Endoscopy;  Laterality: N/A;   ESOPHAGOGASTRODUODENOSCOPY (EGD) WITH PROPOFOL N/A 07/17/2018   Procedure: ESOPHAGOGASTRODUODENOSCOPY (EGD) WITH PROPOFOL;  Surgeon: Laurence Spates, MD;  Location: [place];  Service: Endoscopy;  Laterality: N/A;   IR REMOVAL TUN ACCESS W/ PORT W/O FL MOD SED  08/18/2021   LAPAROSCOPY N/A 12/30/2020   Procedure: LAPAROSCOPY DIAGNOSTIC; WITH  BIOPSY OF ABDOMINAL WALL NODULES AND BIOPSY OF PERICOLONIC EXUDATE;  Surgeon: Kinsinger, Arta Bruce, MD;  Location: [facility];  Service: General;  Laterality: N/A;   PORTACATH PLACEMENT N/A 01/09/2021   Procedure: PORT INSERTION WITH [place] & FLUORO;  Surgeon: Mickeal Skinner, MD;  Location: [facility];  Service: General;  Laterality: N/A;   REPAIR RECTOCELE     TOE SURGERY Left     Current Medications: Current Meds  Medication Sig   acetaminophen (TYLENOL) 500 MG tablet Take 1,000 mg by mouth every 6 (six) hours as needed for mild pain, moderate pain, fever or headache.   atorvastatin (LIPITOR) 20 MG tablet Take 20 mg by mouth daily.   Biotin 1000 MCG CHEW    Cholecalciferol (VITAMIN D-3) 25 MCG (1000 UT) CAPS Take  1,000 Units by mouth daily.   co-enzyme Q-10 30 MG capsule Take by mouth.   estradiol (ESTRACE VAGINAL) 0.1 MG/GM vaginal cream Place 1 Applicatorful vaginally at bedtime.   fluticasone-salmeterol (ADVAIR DISKUS) 100-50 MCG/ACT AEPB Inhale 1 puff into the lungs 2 (two) times daily. (Patient taking differently: Inhale 1 puff into the lungs as needed.)   hydrocortisone 2.5 % cream Apply topically.   loratadine (CLARITIN) 10 MG tablet Take 10 mg by mouth daily.   losartan (COZAAR) 50 MG tablet Take 50 mg by mouth daily.   Melatonin 5 MG CHEW Chew 20 mg by mouth at bedtime.   metFORMIN (GLUCOPHAGE) 500 MG tablet Take 500 mg by mouth 2 (two) times daily.   pantoprazole (PROTONIX) 40 MG tablet Take 1 tablet (40 mg total) by mouth daily.   polyethylene glycol (MIRALAX / GLYCOLAX) 17 g packet Take 8.5 g by mouth 2 (two) times daily.   Probiotic Product (PROBIOTIC BLEND PO) Take by mouth.   sennosides-docusate sodium (SENOKOT-S) 8.6-50 MG tablet Take 1 tablet by mouth 2 (two) times daily as needed for constipation.     Allergies:   Macrobid [nitrofurantoin], Ciprofloxacin, Codeine, and Azithromycin   Social History   Socioeconomic History   Marital status: Divorced    Spouse name: Not on file   Number of children: 2   Years of education: Not on file   Highest education level: Master's degree (e.g., MA, MS, MEng, MEd, MSW, MBA)  Occupational History   Occupation: Retired  Tobacco Use   Smoking status: Never   Smokeless tobacco: Never  Vaping Use   Vaping Use: Never used  Substance and Sexual Activity   Alcohol use: No   Drug use: No   Sexual activity: Not on file  Other Topics Concern   Not on file  Social History Narrative   Not on file   Social Determinants of Health   Financial Resource Strain: Low Risk  (02/27/2018)   Overall Financial Resource Strain (CARDIA)    Difficulty of Paying Living Expenses: Not hard at all  Food Insecurity: No Food Insecurity (02/27/2018)   Hunger Vital  Sign    Worried About Running Out of Food in the Last Year: Never true    [place] in the Last Year: Never true  Transportation Needs: No Transportation Needs (02/27/2018)   PRAPARE - Hydrologist (Medical): No    Lack of Transportation (Non-Medical): No  Physical Activity: Sufficiently Active (02/27/2018)   Exercise Vital Sign    Days of Exercise per Week: 7 days    Minutes of Exercise per Session: 60 min  Stress: No Stress Concern Present (02/27/2018)   [place]  [employer] Occupational [employer] - Occupational Stress Questionnaire    Feeling of Stress : Only a little  Social Connections: Somewhat Isolated (02/27/2018)   Social Connection and Isolation Panel [NHANES]    Frequency of Communication with Friends and Family: More than three times a week    Frequency of Social Gatherings with Friends and Family: More than three times a week    Attends Religious Services: Never    Marine scientist or Organizations: Yes    Attends Music therapist: 1 to 4 times per year    Marital Status: Divorced     Family History: The patient's family history includes Breast cancer (age of onset: 25) in her mother; CAD in her mother; Cancer in her paternal aunt; Cancer (age of onset: 1) in her paternal aunt; Lung cancer in her paternal grandfather; Lung cancer (age of onset: 20) in her father.  ROS:   Please see the history of present illness.    + chest pain + LUE pain All other systems reviewed and are negative.  Labs/Other Studies Reviewed:    The following studies were reviewed today:  Echo 04/27/20 1. Left ventricular ejection fraction, by estimation, is 55 to 60%. The  left ventricle has normal function. The left ventricle has no regional  wall motion abnormalities. Left ventricular diastolic parameters were  normal.   2. Right ventricular systolic function is normal. The right ventricular  size is normal.   3. Left atrial size was mildly  dilated.   4. The mitral valve is normal in structure. No evidence of mitral valve  regurgitation. No evidence of mitral stenosis.   5. The aortic valve was not well visualized. Aortic valve regurgitation  is not visualized. No aortic stenosis is present.   6. The inferior vena cava is dilated in size with >50% respiratory  variability, suggesting right atrial pressure of 8 mmHg.  CCTA 06/25/19 1. Coronary calcium score of 35.2. This was 68th percentile for age and sex matched control.   2. Normal coronary origin with right dominance.   3.  Non-obstructive CAD.   4. Coronary aneurysm in the RCA unchanged from 02/28/18.  Recent Labs: 05/04/2022: ALT 15 06/07/2022: B Natriuretic Peptide 44.8; BUN 14; Creatinine, Ser 0.60; Hemoglobin 12.9; Platelets 213; Potassium 3.7; Sodium 141  Recent Lipid Panel    Component Value Date/Time   CHOL 177 02/04/2020 1036   TRIG 166 (H) 02/04/2020 1036   HDL 48 02/04/2020 1036   CHOLHDL 3.7 02/04/2020 1036   CHOLHDL 4.5 02/28/2018 0345   VLDL 41 (H) 02/28/2018 0345   LDLCALC 100 (H) 02/04/2020 1036     Risk Assessment/Calculations:       Physical Exam:    VS:  BP (!) 124/54   Pulse 61   Ht _0  (1.651 m)   Wt 240 lb 3.2 oz (109 kg)   SpO2 95%   BMI 39.97 kg/m     Wt Readings from Last 3 Encounters:  06/11/22 240 lb 3.2 oz (109 kg)  06/07/22 237 lb (107.5 kg)  05/06/22 238 lb 6.4 oz (108.1 kg)     GEN:  Well nourished, well developed in no acute distress HEENT: Normal NECK: No JVD; No carotid bruits CARDIAC: RRR, no murmurs, rubs, gallops RESPIRATORY:  Clear to auscultation without rales, wheezing or rhonchi  ABDOMEN: Soft, non-tender, non-distended MUSCULOSKELETAL:  No edema; No deformity. 2+ pedal pulses, equal bilaterally SKIN: Warm and dry NEUROLOGIC:  Alert and oriented x 3 PSYCHIATRIC:  Normal affect   EKG:  EKG is not ordered today. EKG from 06/09/22 reviewed which revealed sinus bradycardia at 58 bpm, low voltage  QRS  Diagnoses:    1. Coronary artery aneurysm   2. Hyperlipidemia LDL goal <70   3. Aortic atherosclerosis ([place])   4. Primary hypertension   5. Coronary artery disease involving native coronary artery of native heart without angina pectoris    Assessment and Plan:     Chest pain: Has mid-sternal chest discomfort in addition to left arm discomfort.  Characteristics mostly inconsistent with angina including worsening of pain at rest and constant discomfort. No associated SOB, n/v, worsening diaphoresis. Recent testing in ED 12/18 reassuring. In light of previous aneurysm of RCA will consider further ischemia evaluation. Would favor repeat coronary CTA for comparison to previous. She will consider this and call back if she decides to proceed. She would like to have lipid studies completed as described below.   Aneurysm of RCA: Aneurysm in mid RCA 1.5 cm x 1.2 cm on CCTA 06/2019 unchanged from 2019 CT. Chest pain as described above, she feels is more likely musculoskeletal. If we proceed with repeat imaging, would favor coronary CTA.  She discontinued Plavix and aspirin due to bleeding concerns during chemotherapy.   CAD/Aortic atherosclerosis: Mild nonobstructive CAD (< 25% calcified plaque LAD) with coronary calcium score of 35.2 on CCTA 06/2019. Continue heart healthy, mostly plant based diet and regular exercise.  We discussed aspirin as a prevention strategy which she is well-versed in the research. Will consider restarting ECASA 81 mg. Continue atorvastatin.  Hyperlipidemia LDL goal <70: We will get fasting NMR with lipid profile, apolipoprotein B, and lipoprotein a levels in 2 weeks.    Disposition: 6 months, she has asked to see me in follow-up  Medication Adjustments/Labs and Tests Ordered: Current medicines are reviewed at length with the patient today.  Concerns regarding medicines are outlined above.  Orders Placed This Encounter  Procedures   NMR, lipoprofile   Apolipoprotein B    Lipoprotein A (LPA)   Apolipoprotein B   Lipoprotein A (LPA)   NMR, lipoprofile   No orders of the defined types were placed in this encounter.   Patient Instructions  Medication Instructions:   Your physician recommends that you continue on your current medications as directed. Please refer to the Current Medication list given to you today.   *If you need a refill on your cardiac medications before your next appointment, please call your pharmacy*   Lab Work:  Your physician recommends that you return for a FASTING NMR LIPO/APO LIPO B/LIPO A. On Tuesday, January 9. You can come in on the day of your appointment anytime between 7:30-4:30 fasting from midnight the night before.   If you have labs (blood work) drawn today and your tests are completely normal, you will receive your results only by: [place] (if you have MyChart) OR A paper copy in the mail If you have any lab test that is abnormal or we need to change your treatment, we will call you to review the results.   Testing/Procedures:  None ordered.   Follow-Up: At [facility], you and your health needs are our priority.  As part of our continuing mission to provide you with exceptional heart care, we have created designated Provider Care Teams.  These Care Teams include your primary Cardiologist (physician) and Advanced Practice Providers (APPs -  Physician Assistants and Nurse Practitioners) who all work together to provide you with  the care you need, when you need it.  We recommend signing up for the patient portal called "MyChart".  Sign up information is provided on this After Visit Summary.  MyChart is used to connect with patients for Virtual Visits (Telemedicine).  Patients are able to view lab/test results, encounter notes, upcoming appointments, etc.  Non-urgent messages can be sent to your provider as well.   To learn more about what you can do with MyChart, go to [email or web address].     Your next appointment:   6 month(s)  The format for your next appointment:   In Person  Provider:   Christen Bame, NP         Other Instructions  Your physician wants you to follow-up in: 6 months with Christen Bame, NP. You will receive a reminder letter in the mail two months in advance. If you don't receive a letter, please call our office to schedule the follow-up appointment.   Important Information About Sugar         Signed, Emmaline Life, NP  06/11/2022 11:30 AM    [place]

## 2022-06-11 ENCOUNTER — Ambulatory Visit: Payer: Medicare Other | Attending: Nurse Practitioner | Admitting: Nurse Practitioner

## 2022-06-11 ENCOUNTER — Encounter: Payer: Self-pay | Admitting: Nurse Practitioner

## 2022-06-11 VITALS — BP 124/54 | HR 61 | Ht 65.0 in | Wt 240.2 lb

## 2022-06-11 DIAGNOSIS — I2510 Atherosclerotic heart disease of native coronary artery without angina pectoris: Secondary | ICD-10-CM

## 2022-06-11 DIAGNOSIS — I10 Essential (primary) hypertension: Secondary | ICD-10-CM

## 2022-06-11 DIAGNOSIS — I700 Atherosclerosis of aorta: Secondary | ICD-10-CM

## 2022-06-11 DIAGNOSIS — E785 Hyperlipidemia, unspecified: Secondary | ICD-10-CM

## 2022-06-11 DIAGNOSIS — I2541 Coronary artery aneurysm: Secondary | ICD-10-CM

## 2022-06-11 NOTE — Patient Instructions (Signed)
Medication Instructions:   Your physician recommends that you continue on your current medications as directed. Please refer to the Current Medication list given to you today.   *If you need a refill on your cardiac medications before your next appointment, please call your pharmacy*   Lab Work:  Your physician recommends that you return for a FASTING NMR LIPO/APO LIPO B/LIPO A. On Tuesday, January 9. You can come in on the day of your appointment anytime between 7:30-4:30 fasting from midnight the night before.   If you have labs (blood work) drawn today and your tests are completely normal, you will receive your results only by: [place] (if you have MyChart) OR A paper copy in the mail If you have any lab test that is abnormal or we need to change your treatment, we will call you to review the results.   Testing/Procedures:  None ordered.   Follow-Up: At [facility], you and your health needs are our priority.  As part of our continuing mission to provide you with exceptional heart care, we have created designated Provider Care Teams.  These Care Teams include your primary Cardiologist (physician) and Advanced Practice Providers (APPs -  Physician Assistants and Nurse Practitioners) who all work together to provide you with the care you need, when you need it.  We recommend signing up for the patient portal called "MyChart".  Sign up information is provided on this After Visit Summary.  MyChart is used to connect with patients for Virtual Visits (Telemedicine).  Patients are able to view lab/test results, encounter notes, upcoming appointments, etc.  Non-urgent messages can be sent to your provider as well.   To learn more about what you can do with MyChart, go to [email or web address].    Your next appointment:   6 month(s)  The format for your next appointment:   In Person  Provider:   Christen Bame, NP         Other Instructions  Your physician  wants you to follow-up in: 6 months with Christen Bame, NP. You will receive a reminder letter in the mail two months in advance. If you don't receive a letter, please call our office to schedule the follow-up appointment.   Important Information About Sugar

## 2022-06-29 ENCOUNTER — Ambulatory Visit: Payer: Medicare Other | Attending: Nurse Practitioner

## 2022-06-29 DIAGNOSIS — E785 Hyperlipidemia, unspecified: Secondary | ICD-10-CM

## 2022-06-29 DIAGNOSIS — I700 Atherosclerosis of aorta: Secondary | ICD-10-CM

## 2022-06-29 DIAGNOSIS — I10 Essential (primary) hypertension: Secondary | ICD-10-CM

## 2022-07-01 LAB — NMR, LIPOPROFILE
Cholesterol, Total: 163 mg/dL (ref 100–199)
HDL Particle Number: 39.5 umol/L (ref >=30.5)
HDL-C: 55 mg/dL (ref >39)
LDL Particle Number: 1175 nmol/L — ABNORMAL HIGH (ref <1000)
LDL Size: 20.8 nm (ref >20.5)
LDL-C (NIH Calc): 89 mg/dL (ref 0–99)
LP-IR Score: 64 — ABNORMAL HIGH (ref <=45)
Small LDL Particle Number: 506 nmol/L (ref <=527)
Triglycerides: 107 mg/dL (ref 0–149)

## 2022-07-01 LAB — APOLIPOPROTEIN B: Apolipoprotein B: 79 mg/dL (ref <90)

## 2022-07-01 LAB — LIPOPROTEIN A (LPA): Lipoprotein (a): 51 nmol/L (ref <75.0)

## 2022-07-05 DIAGNOSIS — E785 Hyperlipidemia, unspecified: Secondary | ICD-10-CM

## 2022-07-05 MED ORDER — ATORVASTATIN CALCIUM 40 MG PO TABS: 40.0000 mg | ORAL_TABLET | Freq: Every day | ORAL | 3 refills | Status: AC

## 2022-07-06 ENCOUNTER — Other Ambulatory Visit: Payer: Self-pay | Admitting: *Deleted

## 2022-07-06 DIAGNOSIS — E785 Hyperlipidemia, unspecified: Secondary | ICD-10-CM

## 2022-07-06 NOTE — Addendum Note (Signed)
Addended by: Gaetano Net on: 07/06/2022 10:25 AM   Modules accepted: Orders

## 2022-07-29 ENCOUNTER — Telehealth: Payer: Self-pay | Admitting: Nurse Practitioner

## 2022-07-29 DIAGNOSIS — I2541 Coronary artery aneurysm: Secondary | ICD-10-CM

## 2022-07-29 DIAGNOSIS — I25118 Atherosclerotic heart disease of native coronary artery with other forms of angina pectoris: Secondary | ICD-10-CM

## 2022-07-29 NOTE — Telephone Encounter (Signed)
Patient calling to discuss Brittany Middleton ordering her a calcium score test.

## 2022-07-30 ENCOUNTER — Encounter: Payer: Self-pay | Admitting: Hematology and Oncology

## 2022-07-30 ENCOUNTER — Other Ambulatory Visit: Payer: Self-pay

## 2022-07-30 MED ORDER — METOPROLOL TARTRATE 50 MG PO TABS: 50.0000 mg | ORAL_TABLET | Freq: Once | ORAL | 0 refills | Status: DC

## 2022-07-30 NOTE — Telephone Encounter (Signed)
Spoke with patient who states she is having concerning symptoms of chest discomfort and discomfort that radiates into her back between her shoulder blades. She has a history of RCA aneurysm that was discovered and 2019 on CT.  We will go ahead and repeat coronary CTA since it has been 3 years since the previous test. She is aware that someone will contact her to schedule. Will have her take metoprolol tartrate 50 mg prior to test as her typical resting HR is 50-60s bpm.

## 2022-08-03 ENCOUNTER — Encounter: Payer: Self-pay | Admitting: *Deleted

## 2022-08-03 NOTE — Telephone Encounter (Deleted)
Your cardiac CT will be scheduled at:  [facility] [address] [place], [place] [address] [phone]  Please arrive at the [facility] and Children's Entrance (Entrance C2) of [facility] 30 minutes prior to test start time. You can use the FREE valet parking offered at entrance C (encouraged to control the heart rate for the test)  Proceed to the [facility] Radiology Department (first floor) to check-in and test prep.  All radiology patients and guests should use entrance C2 at [facility], accessed from [facility], even though the hospital's physical address listed is [address]     Please follow these instructions carefully (unless otherwise directed):  On the Night Before the Test: Be sure to Drink plenty of water. Do not consume any caffeinated/decaffeinated beverages or chocolate 12 hours prior to your test. Do not take any antihistamines 12 hours prior to your test. If the patient has contrast allergy: Patient will need a prescription for Prednisone and very clear instructions (as follows): Prednisone 50 mg - take 13 hours prior to test Take another Prednisone 50 mg 7 hours prior to test Take another Prednisone 50 mg 1 hour prior to test Take Benadryl 50 mg 1 hour prior to test Patient must complete all four doses of above prophylactic medications. Patient will need a ride after test due to Benadryl.  On the Day of the Test: Drink plenty of water until 1 hour prior to the test. Do not eat any food 1 hour prior to test. You may take your regular medications prior to the test.  Take metoprolol (Lopressor) two hours prior to test. If you take Furosemide/Hydrochlorothiazide/Spironolactone, please HOLD on the morning of the test. FEMALES- please wear underwire-free bra if available, avoid dresses & tight clothing   *For Clinical Staff only. Please instruct patient the following:* Heart Rate Medication  Recommendations for Cardiac CT  Resting HR < 50 bpm  No medication  Resting HR 50-60 bpm and BP >110/50 mmHG   Consider Metoprolol tartrate 25 mg PO 90-120 min prior to scan  Resting HR 60-65 bpm and BP >110/50 mmHG  Metoprolol tartrate 50 mg PO 90-120 minutes prior to scan   Resting HR > 65 bpm and BP >110/50 mmHG  Metoprolol tartrate 100 mg PO 90-120 minutes prior to scan  Consider Ivabradine 10-15 mg PO or a calcium channel blocker for resting HR >60 bpm and contraindication to metoprolol tartrate  Consider Ivabradine 10-15 mg PO in combination with metoprolol tartrate for HR >80 bpm         After the Test: Drink plenty of water. After receiving IV contrast, you may experience a mild flushed feeling. This is normal. On occasion, you may experience a mild rash up to 24 hours after the test. This is not dangerous. If this occurs, you can take Benadryl 25 mg and increase your fluid intake. If you experience trouble breathing, this can be serious. If it is severe call 911 IMMEDIATELY. If it is mild, please call our office. If you take any of these medications: Glipizide/Metformin, Avandament, Glucavance, please do not take 48 hours after completing test unless otherwise instructed.  We will call to schedule your test 2-4 weeks out understanding that some insurance companies will need an authorization prior to the service being performed.   For non-scheduling related questions, please contact the cardiac imaging nurse navigator should you have any questions/concerns: Marchia Bond, Cardiac Imaging Nurse Navigator [name], Cardiac Imaging Nurse  Navigator [facility] Direct Office Dial: [phone]   For scheduling needs, including cancellations and rescheduling, please call [place], [phone].

## 2022-08-03 NOTE — Telephone Encounter (Signed)
    Your cardiac CT will be scheduled at:   [facility] [address] [place], [place] [address] [phone]   Please arrive at the [facility] and Children's Entrance (Entrance C2) of [facility] 30 minutes prior to test start time. You can use the FREE valet parking offered at entrance C (encouraged to control the heart rate for the test)  Proceed to the [facility] Radiology Department (first floor) to check-in and test prep.   All radiology patients and guests should use entrance C2 at [facility], accessed from [facility], even though the hospital's physical address listed is [address]        Please follow these instructions carefully (unless otherwise directed):   On the Night Before the Test: Be sure to Drink plenty of water. Do not consume any caffeinated/decaffeinated beverages or chocolate 12 hours prior to your test. Do not take any antihistamines 12 hours prior to your test.    On the Day of the Test: Drink plenty of water until 1 hour prior to the test. Do not eat any food 1 hour prior to test. You may take your regular medications prior to the test.  Take metoprolol (Lopressor) 50 mg two hours prior to test.  THIS HAS BEEN SENT TO YOUR PHARMACY HOLD METFORMIN FEMALES- please wear underwire-free bra if available, avoid dresses & tight clothing        After the Test: Drink plenty of water. After receiving IV contrast, you may experience a mild flushed feeling. This is normal. On occasion, you may experience a mild rash up to 24 hours after the test. This is not dangerous. If this occurs, you can take Benadryl 25 mg and increase your fluid intake. If you experience trouble breathing, this can be serious. If it is severe call 911 IMMEDIATELY. If it is mild, please call our office. If you take any of these medications: Glipizide/Metformin, Avandament, Glucavance, please do not take 48 hours after completing test  unless otherwise instructed.   We will call to schedule your test 2-4 weeks out understanding that some insurance companies will need an authorization prior to the service being performed.    For non-scheduling related questions, please contact the cardiac imaging nurse navigator should you have any questions/concerns: Marchia Bond, Cardiac Imaging Nurse Navigator [name], Cardiac Imaging Nurse Navigator [facility] Direct Office Dial: [phone]    For scheduling needs, including cancellations and rescheduling, please call [place], [phone].

## 2022-08-03 NOTE — Addendum Note (Signed)
Addended by: Gaetano Net on: 08/03/2022 07:42 AM   Modules accepted: Orders

## 2022-08-05 ENCOUNTER — Inpatient Hospital Stay: Payer: Medicare Other | Attending: Hematology and Oncology

## 2022-08-05 DIAGNOSIS — Z79899 Other long term (current) drug therapy: Secondary | ICD-10-CM | POA: Insufficient documentation

## 2022-08-05 DIAGNOSIS — C786 Secondary malignant neoplasm of retroperitoneum and peritoneum: Secondary | ICD-10-CM | POA: Insufficient documentation

## 2022-08-05 DIAGNOSIS — Z7984 Long term (current) use of oral hypoglycemic drugs: Secondary | ICD-10-CM | POA: Insufficient documentation

## 2022-08-05 DIAGNOSIS — C5701 Malignant neoplasm of right fallopian tube: Secondary | ICD-10-CM | POA: Insufficient documentation

## 2022-08-05 DIAGNOSIS — Z7951 Long term (current) use of inhaled steroids: Secondary | ICD-10-CM | POA: Insufficient documentation

## 2022-08-05 DIAGNOSIS — K5900 Constipation, unspecified: Secondary | ICD-10-CM | POA: Diagnosis not present

## 2022-08-05 LAB — CBC WITH DIFFERENTIAL/PLATELET
Abs Immature Granulocytes: 0.01 10*3/uL (ref 0.00–0.07)
Basophils Absolute: 0 10*3/uL (ref 0.0–0.1)
Basophils Relative: 1 %
Eosinophils Absolute: 0.1 10*3/uL (ref 0.0–0.5)
Eosinophils Relative: 3 %
HCT: 37.8 % (ref 36.0–46.0)
Hemoglobin: 12.4 g/dL (ref 12.0–15.0)
Immature Granulocytes: 0 %
Lymphocytes Relative: 26 %
Lymphs Abs: 0.8 10*3/uL (ref 0.7–4.0)
MCH: 30 pg (ref 26.0–34.0)
MCHC: 32.8 g/dL (ref 30.0–36.0)
MCV: 91.5 fL (ref 80.0–100.0)
Monocytes Absolute: 0.3 10*3/uL (ref 0.1–1.0)
Monocytes Relative: 8 %
Neutro Abs: 1.9 10*3/uL (ref 1.7–7.7)
Neutrophils Relative %: 62 %
Platelets: 209 10*3/uL (ref 150–400)
RBC: 4.13 MIL/uL (ref 3.87–5.11)
RDW: 13.9 % (ref 11.5–15.5)
WBC: 3.1 10*3/uL — ABNORMAL LOW (ref 4.0–10.5)
nRBC: 0 % (ref 0.0–0.2)

## 2022-08-05 LAB — CMP (CANCER CENTER ONLY)
ALT: 18 U/L (ref 0–44)
AST: 24 U/L (ref 15–41)
Albumin: 4.2 g/dL (ref 3.5–5.0)
Alkaline Phosphatase: 56 U/L (ref 38–126)
Anion gap: 12 (ref 5–15)
BUN: 11 mg/dL (ref 8–23)
CO2: 28 mmol/L (ref 22–32)
Calcium: 9.1 mg/dL (ref 8.9–10.3)
Chloride: 101 mmol/L (ref 98–111)
Creatinine: 0.7 mg/dL (ref 0.44–1.00)
GFR, Estimated: >60 mL/min (ref >60)
Glucose, Bld: 102 mg/dL — ABNORMAL HIGH (ref 70–99)
Potassium: 4.2 mmol/L (ref 3.5–5.1)
Sodium: 141 mmol/L (ref 135–145)
Total Bilirubin: 0.9 mg/dL (ref 0.3–1.2)
Total Protein: 6.8 g/dL (ref 6.5–8.1)

## 2022-08-06 ENCOUNTER — Encounter: Payer: Self-pay | Admitting: Hematology and Oncology

## 2022-08-06 ENCOUNTER — Inpatient Hospital Stay: Payer: Medicare Other

## 2022-08-06 ENCOUNTER — Inpatient Hospital Stay (HOSPITAL_BASED_OUTPATIENT_CLINIC_OR_DEPARTMENT_OTHER): Payer: Medicare Other | Admitting: Hematology and Oncology

## 2022-08-06 ENCOUNTER — Telehealth (HOSPITAL_COMMUNITY): Payer: Self-pay | Admitting: *Deleted

## 2022-08-06 VITALS — BP 154/67 | HR 86 | Temp 98.3°F | Resp 18 | Ht 65.0 in | Wt 238.0 lb

## 2022-08-06 DIAGNOSIS — C5701 Malignant neoplasm of right fallopian tube: Secondary | ICD-10-CM | POA: Diagnosis not present

## 2022-08-06 DIAGNOSIS — K5909 Other constipation: Secondary | ICD-10-CM | POA: Diagnosis not present

## 2022-08-06 NOTE — Assessment & Plan Note (Signed)
I have reviewed her recent CT imaging which show no evidence of disease I will see her again in June for further follow-up, repeat blood work and imaging studies

## 2022-08-06 NOTE — Progress Notes (Signed)
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[facility]) I have reviewed her recent CT imaging which show no evidence of disease I will see her again in June for further follow-up, repeat blood work and imaging studies  Other constipation We have extensive discussions about changes in her diet and reducing oral fiber intake I also recommend aggressive laxative therapy  Orders Placed This Encounter  Procedures   CT ABDOMEN PELVIS W CONTRAST    Standing Status:   Future    Standing Expiration Date:   08/07/2023    Order Specific Question:   If indicated for the ordered procedure, I authorize the administration of contrast media per Radiology protocol    Answer:   Yes    Order Specific Question:   Preferred imaging location?    Answer:   [facility]    Order Specific Question:   Radiology Contrast Protocol - do NOT remove file path    Answer:   [email or web address]    All questions were answered. The patient knows to call the clinic with any problems, questions or concerns. The total time spent in the appointment was 25 minutes encounter with patients including review of chart and various tests results, discussions about plan of care and coordination of care plan   [name], MD 08/06/2022 11:13 AM  INTERVAL HISTORY: Please see below for problem oriented charting. she returns for surveillance follow-up She has developed constipation recently She is eating a lot of high-fiber food She had CT imaging done in December due to nonspecific chest pain which we reviewed  REVIEW OF SYSTEMS:   Constitutional: Denies fevers, chills or abnormal weight loss Eyes: Denies blurriness of  vision Ears, nose, mouth, throat, and face: Denies mucositis or sore throat Respiratory: Denies cough, dyspnea or wheezes Cardiovascular: Denies palpitation, chest discomfort or lower extremity swelling Skin: Denies abnormal skin rashes Lymphatics: Denies new lymphadenopathy or easy bruising Neurological:Denies numbness, tingling or new weaknesses Behavioral/Psych: Mood is stable, no new changes  All other systems were reviewed with the patient and are negative.  I have reviewed the past medical history, past surgical history, social history and family history with the patient and they are unchanged from previous note.  ALLERGIES:  is allergic to macrobid [nitrofurantoin], ciprofloxacin, codeine, and azithromycin.  MEDICATIONS:  Current Outpatient Medications  Medication Sig Dispense Refill   acetaminophen (TYLENOL) 500 MG tablet Take 1,000 mg by mouth every 6 (six) hours as needed for mild pain, moderate pain, fever or headache.     albuterol (PROVENTIL) (2.5 MG/3ML) 0.083% nebulizer solution Take 2.5 mg by nebulization every 6 (six) hours as needed for wheezing or shortness of breath. (Patient not taking: Reported on 06/11/2022)     albuterol (VENTOLIN HFA) 108 (90 Base) MCG/ACT inhaler Inhale 2 puffs into the lungs every 6 (six) hours as needed for wheezing or shortness of breath. 8 g 6   Ascorbic Acid (VITAMIN C PO) Take by mouth. (Patient not taking: Reported on 06/11/2022)     atorvastatin (LIPITOR) 40 MG tablet Take 1 tablet (40 mg total) by mouth daily. 90 tablet 3   Biotin 1000 MCG CHEW      Cholecalciferol (VITAMIN D-3) 25 MCG (1000 UT) CAPS Take 1,000 Units by mouth daily.     co-enzyme  Q-10 30 MG capsule Take by mouth.     estradiol (ESTRACE VAGINAL) 0.1 MG/GM vaginal cream Place 1 Applicatorful vaginally at bedtime. 42.5 g 12   fluticasone (FLOVENT HFA) 110 MCG/ACT inhaler Inhale 2 puffs into the lungs 2 (two) times daily as needed. (Patient not taking: Reported on 06/11/2022)  1 each 12   fluticasone-salmeterol (ADVAIR DISKUS) 100-50 MCG/ACT AEPB Inhale 1 puff into the lungs 2 (two) times daily. (Patient taking differently: Inhale 1 puff into the lungs as needed.) 1 each 5   hydrocortisone 2.5 % cream Apply topically.     loratadine (CLARITIN) 10 MG tablet Take 10 mg by mouth daily.     losartan (COZAAR) 50 MG tablet Take 50 mg by mouth daily.     Melatonin 5 MG CHEW Chew 20 mg by mouth at bedtime.     metFORMIN (GLUCOPHAGE) 500 MG tablet Take 500 mg by mouth 2 (two) times daily.     metoprolol tartrate (LOPRESSOR) 50 MG tablet Take 1 tablet (50 mg total) by mouth once for 1 dose. 1 tablet 0   Multiple Vitamins-Minerals (MULTIVITAMIN WITH MINERALS) tablet Take 1 tablet by mouth daily. (Patient not taking: Reported on 06/11/2022)     pantoprazole (PROTONIX) 40 MG tablet Take 1 tablet (40 mg total) by mouth daily. 30 tablet 1   polyethylene glycol (MIRALAX / GLYCOLAX) 17 g packet Take 8.5 g by mouth 2 (two) times daily.     Probiotic Product (PROBIOTIC BLEND PO) Take by mouth.     sennosides-docusate sodium (SENOKOT-S) 8.6-50 MG tablet Take 1 tablet by mouth 2 (two) times daily as needed for constipation.     No current facility-administered medications for this visit.    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 - Diverticulosis in the sigmoid colon and in the descending colon. - No specimens collected. - Blood in stool without cause found on endoscopic exam   05/08/2020 Imaging    1. Bladder is decompressed though demonstrates significant perivesicular hazy stranding, mucosal hyperemia and edematous mural thickening. Findings are suggestive of cystitis. Correlate with urinalysis. No abnormal perinephric or periureteral stranding or other features to suggest an ascending tract infection at this time. 2. Colonic diverticulosis without evidence of acute diverticulitis. 3. Aortic Atherosclerosis (ICD10-I70.0).   12/28/2020 Imaging   1. 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 Differential for peritoneal carcinomatosis is primarily neoplastic and includes cancers of the ovary, appendix, colon, pancreas and stomach. PET-CT may be helpful for further characterization. Tissue sampling of the mesentery may be eventually required for diagnosis. 2. Mildly distended small bowel loops throughout the abdomen and pelvis, with fluid and associated air-fluid levels throughout the nondistended small bowel, suggesting a mild ileus versus partial small bowel obstruction. Favor partial small bowel obstruction with transition zone in the RIGHT lower quadrant, likely related to aforementioned neoplastic peritoneal implants and/or adhesions. 3. Small free fluid within the RIGHT upper quadrant and LEFT upper quadrant. No abscess collection seen.  No free intraperitoneal air seen. 4. Extensive colonic diverticulosis without evidence of acute diverticulitis. 5. Small chronic pleural effusions with associated atelectasis.   12/29/2020 Tumor Marker   Patient's tumor was tested for the following markers: CA-125. Results of the tumor marker test revealed 95.1.   12/30/2020 Surgery   Preoperative diagnosis: abdominal nodules   Postoperative diagnosis: same   Procedure: diagnostic laparoscopy with abdominal wall biopsy   Surgeon: Gurney Maxin, M.D.     Indications for procedure: Aveanna Orrick is a 72 y.o. year old female with symptoms of nausea, vomiting, and abdominal pain. Work up was concerning for cancer with abdominal wall studding.   Description of procedure: The patient was brought into the operative suite. Anesthesia was administered with General endotracheal anesthesia. WHO checklist was applied. The patient was then placed in supine position. The area was prepped and draped in the usual sterile fashion.   A small left subcostal incision was made. A 15m trocar was used to gain access to the peritoneal cavity by optical entry technique. Pneumoperitoneum was applied with a high flow and low pressure. The laparoscope was reinserted to confirm position.   On initial visualization of the abdomen, there were multiple nodules throughout the abdominal wall. There were multiple nodules over the small intestine and mesentery. There was some loops of small intestine that were matted together. There was a large white mass in the left upper quadrant. Multiple areas of peritoneum were removed with harmonic scalpel. The large mass was inspected. It appeared to be related to the colon and omentum. Grasper was used to remove some superficial tissue and was sent for culture.   The abdomen was desufflated. The incisions were closed with 4-0 monocryl subcuticular stitch.   12/30/2020 Pathology Results   FINAL MICROSCOPIC DIAGNOSIS:   A. ABDOMINAL WALL, NODULE, EXCISION:  -  Metastatic adenocarcinoma  -  See comment   B. PERI COLONIC EXUDATE, EXCISION:  -  Metastatic adenocarcinoma  -  See comment   COMMENT:   By immunohistochemistry, the neoplastic cells are positive for cytokeratin 7, PAX8 and WT1 but negative for cytokeratin 20, CDX2, p53 and TTF-1.  The morphology and immunophenotype are consistent with a gynecologic primary.     01/01/2021 Initial Diagnosis   [place]itoneal carcinomatosis ([place]   01/08/2021 Cancer Staging   Staging form:  Ovary, Fallopian Tube, and Primary Peritoneal Carcinoma, AJCC 8th Edition - Clinical stage from 01/08/2021: cT3, cN0, cM0 - Signed by GHeath Lark MD on 01/08/2021 Stage prefix: Initial diagnosis   01/12/2021 - 03/11/2021 Chemotherapy   Patient is on Treatment Plan : OVARIAN Carboplatin (AUC 6) / Paclitaxel (175) q21d x 6 cycles     01/17/2021 Imaging   MRI brain No evidence of intracranial metastatic disease or other acute abnormality   02/12/2021 Genetic Testing   No pathogenic variants detected in [employer] [facility] [facility] [employer].  The report date is February 12, 2021.   The [facility] panel offered by [employer]includes sequencing and deletion/duplication testing of the following 48 genes: APC, ATM, AXIN2, BAP1, BARD1, BMPR1A, BRCA1, BRCA2, BRIP1, CHD1, CDK4, CDKN2A(p16 and p14ARF), CHEK2, CTNNA1, EGFR, EPCAM, FH, FLCN, GREM1, HOXB13, MEN1, MET, MITF , MLH1, MSH2, MSH3, MSH6, MUTYH, NTHL1, PALB2, PMS2, POLD1, POLE, PTEN, RAD51C, RAD51D, RET, SDHA, SDHB, SDHC, SDHD, SMAD4, STK11,TERT, TP53, TSC1, TSC2, and VHL.  Unless otherwise specified, all coding regions and flanking non-coding regions are analyzed for sequence variation. Analysis of flanking intronic regions typically do not extend more than 20 bp  before and 10 bp after each exon, though the exact region may be adjusted based on the presence of either potentially significant variants or highly repetitive sequences. Coding regions and proximal promoter regions near the transcription start sites are analyzed for large deletions or duplications. Specific genes are tested only for sequence and/or CNVs within limited regions. Limited clinically relevant regions are included for EGFR (sequencing and CNV analysis of exons 18-21),RET (sequencing and CNV analysis of exons 5, 8, 10, 11, and 13-16), and MITF (sequencing of position c.952). Only CNV analysis of the last two exons of EPCAM is performed. CNV analysis of GREM1 includes the upstream  region overlapping the adjacent gene SCG5. MSH3 exon 1 contains a long polyalanine repeat that can interfere with variant calling; therefore, MSH3 analysis excludes c.121 to c.237. Only sequence analysis of the exons encompassing the exonuclease domains of these genes is performed (POLD1 c.841 to c.1686, POLE c.802 to c.1473). Limited promoter regions in selected genes undergo sequence analysis including TERT (c.-71 to c.-1), and APC Promoter 1B (c.-195 to c.-190 and c.-125 [place]:[id number]).   HRD testing pending.    03/02/2021 Imaging   Outside imaging at [facility] improved peritoneal carcinomatosis. No evidence of new metastatic disease or acute abdominal pathology.    03/02/2021 Imaging   Outside CT imaging done at [facility] improved peritoneal carcinomatosis. No evidence of new metastatic disease or acute abdominal pathology   03/02/2021 - 03/03/2021 Hospital Admission   She was hospitalized briefly for evaluation of hematochezia at [facility].  She underwent CT imaging and colonoscopy and was subsequently discharged   03/03/2021 Procedure   Colonoscopy was performed at [facility]  Findings: Diverticula were found in the entire colon. Internal hemorrhoids were found during retroflexion.  The hemorrhoids were Grade I (internal hemorrhoids that do not prolapse). Impression: - Diverticulosis in the entire examined colon. Likely source of bleeding. - Internal hemorrhoids.   04/07/2021 Surgery   PROCEDURES:  Exam under anesthesia Diagnostic laparoscopy Exploratory laparotomy Lysis of adhesions (>30 minutes) Infragastric omentectomy Total abdominal hysterectomy and bilateral salpingo-oophorectomy Optimal (R0) interval tumor debulking  SURGEON: Surgeon(s) and Role: Mellody Drown, MD - Primary * Salinaro, Whitney Muse, MD - Resident - Assisting * Riki Sheer, Owens Shark, MD - Fellow  INDICATION(S): 72 y.o. who presented with carcinomatosis and mildly elevated CA125 to 95 with biopsy  showing mullerian adenocarcinoma, s/p 3 cycles of neoadjuvant chemotherapy with normalization of CA125 and decreased disease burden on imaging.  OPERATIVE FINDINGS:  On exam, no palpable masses. On laparoscopy, omentum thickened and retracted along the transverse colon, pelvic adhesive disease. On laparotomy, omentum thickened, retracted, and densely adherent to the underlying colon mesentery, but without definitive viable tumor. Adhesions of the omentum to the descending colon, rectosigmoid colon to the left adnexa and posterior uterus and cervix, loop of ileum to the right adnexa, and bladder to anterior uterus and cervix. Transverse colon with small diverticulum that was over-sewed. Appendix normal and retrocecal. No grossly visible tumor in the abdomen including smooth diaphragm and liver surface, normal stomach, small bowel, colon, and normal appearing uterus, bilateral fallopian tubes, and bilateral ovaries.  At the conclusion of the case, no gross residual disease remained (R0).    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   05/22/2021 - 06/19/2021 Chemotherapy   Patient  is on Treatment Plan : OVARIAN Carboplatin (AUC 6) / Paclitaxel (175) q21d x 6 cycles      06/19/2021 Tumor Marker   Patient's tumor was tested for the following markers: CA-125. Results of the tumor marker test revealed 3.   07/22/2021 Tumor Marker   Patient's tumor was tested for the following markers: CA-125. Results of the tumor marker test revealed 3.1.   07/23/2021 Imaging   1. No evidence metastatic ovarian carcinoma. 2. No peritoneal nodularity or fluid. 3. Post hysterectomy and oophorectomy       08/19/2021 Procedure   Successful removal of RIGHT chest implanted  Port-A-Cath, as above   09/24/2021 Tumor Marker   Patient's tumor was tested for the following markers: CA-125. Results of the tumor marker test revealed 3.7.   01/20/2022 Imaging   1. Stable. No evidence for metastatic disease in the abdomen or pelvis. 2. Tiny gas bubble identified in the bladder lumen. Correlation witfor h recent instrumentation recommended. In the absence of recent instrumentation, bladder infection would be a consideration. 3. Tiny hiatal hernia. 4. Left colonic diverticulosis without diverticulitis. 5. Aortic Atherosclerosis (ICD10-I70.0).   02/01/2022 Tumor Marker   Patient's tumor was tested for the following markers: CA-125. Results of the tumor marker test revealed 3.8.   05/06/2022 Tumor Marker   Patient's tumor was tested for the following markers: CA-125. Results of the tumor marker test revealed 3.4.   05/06/2022 Imaging   1. Stable examination without evidence of local recurrence or metastatic disease within the chest, abdomen, or pelvis. 2. Left-sided colonic diverticulosis without findings of acute diverticulitis. 3. Mosaic attenuation of the lung bases with mild diffuse bronchial wall thickening, findings which can be seen in the setting of small airways disease. 4.  Aortic Atherosclerosis (ICD10-I70.0).     PHYSICAL EXAMINATION: ECOG PERFORMANCE STATUS: 1 - Symptomatic but completely ambulatory  Vitals:   08/06/22 0955  BP: (!) 154/67  Pulse: 86  Resp: 18  Temp: 98.3 F (36.8 C)  SpO2: 96%   Filed Weights   08/06/22 0955  Weight: 238 lb (108 kg)    GENERAL:alert, no distress and comfortable  NEURO: alert & oriented x 3 with fluent speech, no focal motor/sensory deficits  LABORATORY DATA:  I have reviewed the data as listed    Component Value Date/Time   NA 141 08/05/2022 0901   NA 143 12/20/2019 0829   K 4.2 08/05/2022 0901   CL 101 08/05/2022 0901   CO2 28 08/05/2022 0901   GLUCOSE 102 (H) 08/05/2022 0901   BUN 11 08/05/2022  0901   BUN 16 12/20/2019 0829   CREATININE 0.70 08/05/2022 0901   CALCIUM 9.1 08/05/2022 0901   PROT 6.8 08/05/2022 0901   PROT 6.4 02/04/2020 1036   ALBUMIN 4.2 08/05/2022 0901   ALBUMIN 4.2 02/04/2020 1036   AST 24 08/05/2022 0901   ALT 18 08/05/2022 0901   ALKPHOS 56 08/05/2022 0901   BILITOT 0.9 08/05/2022 0901   GFRNONAA >60 08/05/2022 0901   GFRAA 104 12/20/2019 0829    No results found for: "SPEP", "UPEP"  Lab Results  Component Value Date   WBC 3.1 (L) 08/05/2022   NEUTROABS 1.9 08/05/2022   HGB 12.4 08/05/2022   HCT 37.8 08/05/2022   MCV 91.5 08/05/2022   PLT 209 08/05/2022      Chemistry      Component Value Date/Time   NA 141 08/05/2022 0901   NA 143 12/20/2019 0829   K 4.2 08/05/2022 0901   CL  101 08/05/2022 0901   CO2 28 08/05/2022 0901   BUN 11 08/05/2022 0901   BUN 16 12/20/2019 0829   CREATININE 0.70 08/05/2022 0901      Component Value Date/Time   CALCIUM 9.1 08/05/2022 0901   ALKPHOS 56 08/05/2022 0901   AST 24 08/05/2022 0901   ALT 18 08/05/2022 0901   BILITOT 0.9 08/05/2022 0901       RADIOGRAPHIC STUDIES: I have reviewed CT imaging from December with the patient I have personally reviewed the radiological images as listed and agreed with the findings in the report.

## 2022-08-06 NOTE — Telephone Encounter (Signed)
Reaching out to patient to offer assistance regarding upcoming cardiac imaging study; pt verbalizes understanding of appt date/time, parking situation and where to check in, pre-test NPO status and medications ordered, and verified current allergies; name and call back number provided for further questions should they arise  Brittany Clement RN Navigator Cardiac Capac and Vascular [phone] office [phone] cell  Patient to take 6m metoprolol tartrate two hours prior to her cardiac CT scan. She is aware to arrive 1pm.

## 2022-08-06 NOTE — Assessment & Plan Note (Signed)
We have extensive discussions about changes in her diet and reducing oral fiber intake I also recommend aggressive laxative therapy

## 2022-08-07 ENCOUNTER — Telehealth: Payer: Self-pay | Admitting: Hematology and Oncology

## 2022-08-07 LAB — CA 125: Cancer Antigen (CA) 125: 2.8 U/mL (ref 0.0–38.1)

## 2022-08-07 NOTE — Telephone Encounter (Signed)
Spoke with patient confirming upcoming appointment 

## 2022-08-09 ENCOUNTER — Telehealth: Payer: Self-pay

## 2022-08-09 ENCOUNTER — Ambulatory Visit (HOSPITAL_COMMUNITY)
Admission: RE | Admit: 2022-08-09 | Discharge: 2022-08-09 | Disposition: A | Discharge status: Home / Self Care | Payer: Medicare Other | Source: Ambulatory Visit | Attending: Nurse Practitioner | Admitting: Nurse Practitioner

## 2022-08-09 DIAGNOSIS — I25118 Atherosclerotic heart disease of native coronary artery with other forms of angina pectoris: Secondary | ICD-10-CM

## 2022-08-09 DIAGNOSIS — I2541 Coronary artery aneurysm: Secondary | ICD-10-CM

## 2022-08-09 MED ORDER — NITROGLYCERIN 0.4 MG SL SUBL
SUBLINGUAL_TABLET | SUBLINGUAL | Status: AC
  Administered 2022-08-09: 0.8 mg via SUBLINGUAL
  Filled 2022-08-09: qty 2

## 2022-08-09 MED ORDER — NITROGLYCERIN 0.4 MG SL SUBL: 0.8000 mg | SUBLINGUAL_TABLET | Freq: Once | SUBLINGUAL | Status: AC

## 2022-08-09 MED ORDER — IOHEXOL 350 MG/ML SOLN
95.0000 mL | Freq: Once | INTRAVENOUS | Status: AC | PRN
  Administered 2022-08-09: 95 mL via INTRAVENOUS

## 2022-08-09 NOTE — Telephone Encounter (Signed)
Called and given below message. She verbalized understanding and appreciated the call.

## 2022-08-09 NOTE — Telephone Encounter (Signed)
-----   Message from [name], MD sent at 08/09/2022  8:07 AM EST ----- Pls let her know CA-125 is normal

## 2022-08-13 ENCOUNTER — Ambulatory Visit: Payer: Medicare Other | Admitting: Cardiovascular Disease

## 2022-09-29 ENCOUNTER — Other Ambulatory Visit: Payer: Self-pay | Admitting: Family Medicine

## 2022-09-29 DIAGNOSIS — E2839 Other primary ovarian failure: Secondary | ICD-10-CM

## 2022-09-30 ENCOUNTER — Ambulatory Visit
Admission: RE | Admit: 2022-09-30 | Discharge: 2022-09-30 | Disposition: A | Discharge status: Home / Self Care | Payer: Medicare Other | Source: Ambulatory Visit | Attending: Family Medicine | Admitting: Family Medicine

## 2022-09-30 DIAGNOSIS — E2839 Other primary ovarian failure: Secondary | ICD-10-CM

## 2022-10-15 ENCOUNTER — Telehealth: Payer: Self-pay

## 2022-10-15 NOTE — Telephone Encounter (Signed)
Returned her call.She has been having digestive issues for about 3 weeks. She tried taking her medications a different way and got no relief. She is having a pressure/ tightness in her abdomen. She is scheduled in June to see Dr. Bertis Ruddy.  She is asking if she can come in one day May 2nd thru May 10th for labs, CT and see you.  She is currently out of town and will not be back in town until 5/1.   Scheduled lab appt on 5/7 at 8 am and see Dr. Bertis Ruddy on 5/10 at 0820. She declined earlier appt with [facility]. Told her Dr. Bertis Ruddy will need to see to evaluate prior to ordering CT. She is aware of appts.

## 2022-10-26 ENCOUNTER — Other Ambulatory Visit: Payer: Self-pay

## 2022-10-26 ENCOUNTER — Other Ambulatory Visit: Payer: Self-pay | Admitting: Hematology and Oncology

## 2022-10-26 ENCOUNTER — Inpatient Hospital Stay: Payer: Medicare Other | Attending: Hematology and Oncology

## 2022-10-26 DIAGNOSIS — Z7984 Long term (current) use of oral hypoglycemic drugs: Secondary | ICD-10-CM | POA: Insufficient documentation

## 2022-10-26 DIAGNOSIS — K5909 Other constipation: Secondary | ICD-10-CM | POA: Diagnosis not present

## 2022-10-26 DIAGNOSIS — Z79899 Other long term (current) drug therapy: Secondary | ICD-10-CM | POA: Insufficient documentation

## 2022-10-26 DIAGNOSIS — C5701 Malignant neoplasm of right fallopian tube: Secondary | ICD-10-CM

## 2022-10-26 DIAGNOSIS — J90 Pleural effusion, not elsewhere classified: Secondary | ICD-10-CM | POA: Diagnosis not present

## 2022-10-26 DIAGNOSIS — K5730 Diverticulosis of large intestine without perforation or abscess without bleeding: Secondary | ICD-10-CM | POA: Insufficient documentation

## 2022-10-26 DIAGNOSIS — D72819 Decreased white blood cell count, unspecified: Secondary | ICD-10-CM | POA: Insufficient documentation

## 2022-10-26 DIAGNOSIS — Z9221 Personal history of antineoplastic chemotherapy: Secondary | ICD-10-CM | POA: Insufficient documentation

## 2022-10-26 DIAGNOSIS — D539 Nutritional anemia, unspecified: Secondary | ICD-10-CM

## 2022-10-26 DIAGNOSIS — E669 Obesity, unspecified: Secondary | ICD-10-CM | POA: Diagnosis not present

## 2022-10-26 DIAGNOSIS — K640 First degree hemorrhoids: Secondary | ICD-10-CM | POA: Insufficient documentation

## 2022-10-26 DIAGNOSIS — I700 Atherosclerosis of aorta: Secondary | ICD-10-CM | POA: Insufficient documentation

## 2022-10-26 DIAGNOSIS — C786 Secondary malignant neoplasm of retroperitoneum and peritoneum: Secondary | ICD-10-CM | POA: Insufficient documentation

## 2022-10-26 LAB — CBC WITH DIFFERENTIAL/PLATELET
Abs Immature Granulocytes: 0.01 K/uL (ref 0.00–0.07)
Basophils Absolute: 0 K/uL (ref 0.0–0.1)
Basophils Relative: 2 %
Eosinophils Absolute: 0.2 K/uL (ref 0.0–0.5)
Eosinophils Relative: 7 %
HCT: 39.8 % (ref 36.0–46.0)
Hemoglobin: 13.1 g/dL (ref 12.0–15.0)
Immature Granulocytes: 0 %
Lymphocytes Relative: 33 %
Lymphs Abs: 0.9 K/uL (ref 0.7–4.0)
MCH: 31 pg (ref 26.0–34.0)
MCHC: 32.9 g/dL (ref 30.0–36.0)
MCV: 94.1 fL (ref 80.0–100.0)
Monocytes Absolute: 0.3 K/uL (ref 0.1–1.0)
Monocytes Relative: 10 %
Neutro Abs: 1.4 K/uL — ABNORMAL LOW (ref 1.7–7.7)
Neutrophils Relative %: 48 %
Platelets: 206 K/uL (ref 150–400)
RBC: 4.23 MIL/uL (ref 3.87–5.11)
RDW: 13.7 % (ref 11.5–15.5)
WBC: 2.7 K/uL — ABNORMAL LOW (ref 4.0–10.5)
nRBC: 0 % (ref 0.0–0.2)

## 2022-10-26 LAB — CMP (CANCER CENTER ONLY)
ALT: 17 U/L (ref 0–44)
AST: 17 U/L (ref 15–41)
Albumin: 4 g/dL (ref 3.5–5.0)
Alkaline Phosphatase: 63 U/L (ref 38–126)
Anion gap: 7 (ref 5–15)
BUN: 14 mg/dL (ref 8–23)
CO2: 29 mmol/L (ref 22–32)
Calcium: 9 mg/dL (ref 8.9–10.3)
Chloride: 103 mmol/L (ref 98–111)
Creatinine: 0.62 mg/dL (ref 0.44–1.00)
GFR, Estimated: >60 mL/min
Glucose, Bld: 102 mg/dL — ABNORMAL HIGH (ref 70–99)
Potassium: 3.9 mmol/L (ref 3.5–5.1)
Sodium: 139 mmol/L (ref 135–145)
Total Bilirubin: 0.9 mg/dL (ref 0.3–1.2)
Total Protein: 7 g/dL (ref 6.5–8.1)

## 2022-10-26 LAB — VITAMIN B12: Vitamin B-12: 252 pg/mL (ref 180–914)

## 2022-10-28 LAB — CA 125: Cancer Antigen (CA) 125: 2.9 U/mL (ref 0.0–38.1)

## 2022-10-29 ENCOUNTER — Encounter: Payer: Self-pay | Admitting: Hematology and Oncology

## 2022-10-29 ENCOUNTER — Other Ambulatory Visit: Payer: Self-pay

## 2022-10-29 ENCOUNTER — Inpatient Hospital Stay (HOSPITAL_BASED_OUTPATIENT_CLINIC_OR_DEPARTMENT_OTHER): Payer: Medicare Other | Admitting: Hematology and Oncology

## 2022-10-29 VITALS — BP 134/58 | HR 63 | Temp 97.5°F | Resp 12 | Wt 230.5 lb

## 2022-10-29 DIAGNOSIS — C5701 Malignant neoplasm of right fallopian tube: Secondary | ICD-10-CM

## 2022-10-29 DIAGNOSIS — E669 Obesity, unspecified: Secondary | ICD-10-CM

## 2022-10-29 DIAGNOSIS — K5909 Other constipation: Secondary | ICD-10-CM

## 2022-10-29 DIAGNOSIS — D72819 Decreased white blood cell count, unspecified: Secondary | ICD-10-CM

## 2022-10-29 NOTE — Progress Notes (Signed)
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[facility]) The patient expressed significant concern over her wellbeing She continues to have intermittent abdominal discomfort along with difficulties with bowel movement Her tumor marker is normal Given her symptoms, we will move her imaging studies to be done this month Continue supportive care  Other constipation She continues to have difficulties with regular bowel movement I recommend the patient to reduce her fiber intake and to continue on laxative therapy As above, we will order CT imaging to assess  Obesity, Class II, BMI 35-39.9 The patient is extremely motivated Despite her best effort, she has hit a plateau with her weight loss journey I gave her resources to consider hiring training to help with her weight loss efforts  Leukopenia Her vitamin B12 is borderline low I recommend oral vitamin B12 supplement  No orders of the defined types were placed in this encounter.   All questions were answered. The patient knows to call the clinic with any problems, questions or concerns. The total time spent in the appointment was 30 minutes encounter with patients including review of chart and various tests results, discussions about plan of care and coordination of care plan   Artis Delay, MD 10/29/2022 9:24 AM  INTERVAL HISTORY: Please see below for problem oriented charting. she returns for follow-up She has not been feeling well She has abdominal discomfort on a regular basis She have difficulties with regular bowel movement despite regular laxative therapy According to her food intake, it appears that the patient is eating a lot of fiber containing diet She has lost some  weight since last year but has hit a plateau Denies recent infection We reviewed her recent blood test  REVIEW OF SYSTEMS:   Constitutional: Denies fevers, chills or abnormal weight loss Eyes: Denies blurriness of vision Ears, nose, mouth, throat, and face: Denies mucositis or sore throat Respiratory: Denies cough, dyspnea or wheezes Cardiovascular: Denies palpitation, chest discomfort or lower extremity swelling Skin: Denies abnormal skin rashes Lymphatics: Denies new lymphadenopathy or easy bruising Neurological:Denies numbness, tingling or new weaknesses Behavioral/Psych: Mood is stable, no new changes  All other systems were reviewed with the patient and are negative.  I have reviewed the past medical history, past surgical history, social history and family history with the patient and they are unchanged from previous note.  ALLERGIES:  is allergic to macrobid [nitrofurantoin], ciprofloxacin, codeine, and azithromycin.  MEDICATIONS:  Current Outpatient Medications  Medication Sig Dispense Refill   cholecalciferol (VITAMIN D3) 25 MCG (1000 UNIT) tablet Take 1,000 Units by mouth daily.     Omega 3 1000 MG CAPS Take 1 capsule by mouth daily.     UNABLE TO FIND Take 1 capsule by mouth daily. Med Name: [place] Tail Mushroom     acetaminophen (TYLENOL) 500 MG tablet Take 1,000 mg by mouth every 6 (six) hours as needed for mild pain, moderate pain, fever or headache.     albuterol (VENTOLIN HFA) 108 (90 Base) MCG/ACT inhaler Inhale 2 puffs into the lungs every 6 (six) hours as needed for wheezing or shortness of breath. 8 g 6   atorvastatin (LIPITOR) 40 MG tablet Take 1 tablet (40 mg total) by mouth daily. 90 tablet 3   Biotin 1000 MCG CHEW  Cholecalciferol (VITAMIN D-3) 25 MCG (1000 UT) CAPS Take 1,000 Units by mouth daily.     co-enzyme Q-10 30 MG capsule Take by mouth.     estradiol (ESTRACE VAGINAL) 0.1 MG/GM vaginal cream Place 1 Applicatorful vaginally at bedtime. 42.5 g 12    hydrocortisone 2.5 % cream Apply topically.     loratadine (CLARITIN) 10 MG tablet Take 10 mg by mouth daily.     losartan (COZAAR) 50 MG tablet Take 50 mg by mouth daily.     Melatonin 5 MG CHEW Chew 20 mg by mouth at bedtime.     metFORMIN (GLUCOPHAGE) 500 MG tablet Take 500 mg by mouth 2 (two) times daily.     pantoprazole (PROTONIX) 40 MG tablet Take 1 tablet (40 mg total) by mouth daily. 30 tablet 1   polyethylene glycol (MIRALAX / GLYCOLAX) 17 g packet Take 8.5 g by mouth 2 (two) times daily.     Probiotic Product (PROBIOTIC BLEND PO) Take by mouth.     sennosides-docusate sodium (SENOKOT-S) 8.6-50 MG tablet Take 1 tablet by mouth 2 (two) times daily as needed for constipation.     No current facility-administered medications for this visit.    SUMMARY OF ONCOLOGIC HISTORY: Oncology History Overview Note  BRCA1/2 negative testing High grade serous, near complete pathological response to neoadjuvant chemo   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HCC) (Resolved)  12/30/2020 Initial Diagnosis   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HCC)   01/12/2021 - 03/11/2021 Chemotherapy   Patient is on Treatment Plan : OVARIAN Carboplatin (AUC 6) / Paclitaxel (175) q21d x 6 cycles     Adenocarcinoma of right fallopian tube (HCC)  07/18/2018 Procedure   Colonoscopy - Non-bleeding internal hemorrhoids. - Diverticulosis in the sigmoid colon and in the descending colon. - No specimens collected. - Blood in stool without cause found on endoscopic exam   05/08/2020 Imaging   1. Bladder is decompressed though demonstrates significant perivesicular hazy stranding, mucosal hyperemia and edematous mural thickening. Findings are suggestive of cystitis. Correlate with urinalysis. No abnormal perinephric or periureteral stranding or other features to suggest an ascending tract infection at this time. 2. Colonic diverticulosis without evidence of acute diverticulitis. 3. Aortic Atherosclerosis (ICD10-I70.0).   12/28/2020 Imaging    1. 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 Differential for peritoneal carcinomatosis is primarily neoplastic and includes cancers of the ovary, appendix, colon, pancreas and stomach. PET-CT may be helpful for further characterization. Tissue sampling of the mesentery may be eventually required for diagnosis. 2. Mildly distended small bowel loops throughout the abdomen and pelvis, with fluid and associated air-fluid levels throughout the nondistended small bowel, suggesting a mild ileus versus partial small bowel obstruction. Favor partial small bowel obstruction with transition zone in the RIGHT lower quadrant, likely related to aforementioned neoplastic peritoneal implants and/or adhesions. 3. Small free fluid within the RIGHT upper quadrant and LEFT upper quadrant. No abscess collection seen. No free intraperitoneal air seen. 4. Extensive colonic diverticulosis without evidence of acute diverticulitis. 5. Small chronic pleural effusions with associated atelectasis.   12/29/2020 Tumor Marker   Patient's tumor was tested for the following markers: CA-125. Results of the tumor marker test revealed 95.1.   12/30/2020 Surgery   Preoperative diagnosis: abdominal nodules   Postoperative diagnosis: same   Procedure: diagnostic laparoscopy with abdominal wall biopsy   Surgeon: Feliciana Rossetti, M.D.    Indications for procedure: Raffaella Rosenbloom is a  72 y.o. year old female with symptoms of nausea, vomiting, and abdominal pain. Work up was concerning for cancer with abdominal wall studding.   Description of procedure: The patient was brought into the operative suite. Anesthesia was administered with General endotracheal anesthesia. WHO checklist was applied. The patient was then placed in supine position. The area was prepped and draped in the  usual sterile fashion.   A small left subcostal incision was made. A 5mm trocar was used to gain access to the peritoneal cavity by optical entry technique. Pneumoperitoneum was applied with a high flow and low pressure. The laparoscope was reinserted to confirm position.   On initial visualization of the abdomen, there were multiple nodules throughout the abdominal wall. There were multiple nodules over the small intestine and mesentery. There was some loops of small intestine that were matted together. There was a large white mass in the left upper quadrant. Multiple areas of peritoneum were removed with harmonic scalpel. The large mass was inspected. It appeared to be related to the colon and omentum. Grasper was used to remove some superficial tissue and was sent for culture.   The abdomen was desufflated. The incisions were closed with 4-0 monocryl subcuticular stitch.   12/30/2020 Pathology Results   FINAL MICROSCOPIC DIAGNOSIS:   A. ABDOMINAL WALL, NODULE, EXCISION:  -  Metastatic adenocarcinoma  -  See comment   B. PERI COLONIC EXUDATE, EXCISION:  -  Metastatic adenocarcinoma  -  See comment   COMMENT:   By immunohistochemistry, the neoplastic cells are positive for cytokeratin 7, PAX8 and WT1 but negative for cytokeratin 20, CDX2, p53 and TTF-1.  The morphology and immunophenotype are consistent with a gynecologic primary.     01/01/2021 Initial Diagnosis   Peritoneal carcinomatosis (HCC)   01/08/2021 Cancer Staging   Staging form: Ovary, Fallopian Tube, and Primary Peritoneal Carcinoma, AJCC 8th Edition - Clinical stage from 01/08/2021: cT3, cN0, cM0 - Signed by Artis Delay, MD on 01/08/2021 Stage prefix: Initial diagnosis   01/12/2021 - 03/11/2021 Chemotherapy   Patient is on Treatment Plan : OVARIAN Carboplatin (AUC 6) / Paclitaxel (175) q21d x 6 cycles     01/17/2021 Imaging   MRI brain No evidence of intracranial metastatic disease or other acute abnormality   02/12/2021  Genetic Testing   No pathogenic variants detected in [facility].  The report date is February 12, 2021.   The [facility] gene panel offered by [employer] includes sequencing and deletion/duplication testing of the following 48 genes: APC, ATM, AXIN2, BAP1, BARD1, BMPR1A, BRCA1, BRCA2, BRIP1, CHD1, CDK4, CDKN2A(p16 and p14ARF), CHEK2, CTNNA1, EGFR, EPCAM, FH, FLCN, GREM1, HOXB13, MEN1, MET, MITF , MLH1, MSH2, MSH3, MSH6, MUTYH, NTHL1, PALB2, PMS2, POLD1, POLE, PTEN, RAD51C, RAD51D, RET, SDHA, SDHB, SDHC, SDHD, SMAD4, STK11,TERT, TP53, TSC1, TSC2, and VHL.  Unless otherwise specified, all coding regions and flanking non-coding regions are analyzed for sequence variation. Analysis of flanking intronic regions typically do not extend more than 20 bp before and 10 bp after each exon, though the exact region may be adjusted based on the presence of either potentially significant variants or highly repetitive sequences. Coding regions and proximal promoter regions near the transcription start sites are analyzed for large deletions or duplications. Specific genes are tested only for sequence and/or CNVs within limited regions. Limited clinically relevant regions are included for EGFR (sequencing and CNV analysis of exons 18-21),RET (sequencing and CNV analysis of exons 5, 8, 10, 11, and 13-16), and MITF (sequencing  of position c.952). Only CNV analysis of the last two exons of EPCAM is performed. CNV analysis of GREM1 includes the upstream region overlapping the adjacent gene SCG5. MSH3 exon 1 contains a long polyalanine repeat that can interfere with variant calling; therefore, MSH3 analysis excludes c.121 to c.237. Only sequence analysis of the exons encompassing the exonuclease domains of these genes is performed (POLD1 c.841 to c.1686, POLE c.802 to c.1473). Limited promoter regions in selected genes undergo sequence analysis including TERT (c.-71 to c.-1), and APC Promoter 1B (c.-195 to  c.-190 and c.-125 ([id number]).   HRD testing pending.    03/02/2021 Imaging   Outside imaging at [facility] improved peritoneal carcinomatosis. No evidence of new metastatic disease or acute abdominal pathology.    03/02/2021 Imaging   Outside CT imaging done at [facility] improved peritoneal carcinomatosis. No evidence of new metastatic disease or acute abdominal pathology   03/02/2021 - 03/03/2021 Hospital Admission   She was hospitalized briefly for evaluation of hematochezia at [facility].  She underwent CT imaging and colonoscopy and was subsequently discharged   03/03/2021 Procedure   Colonoscopy was performed at [facility]  Findings: Diverticula were found in the entire colon. Internal hemorrhoids were found during retroflexion.  The hemorrhoids were Grade I (internal hemorrhoids that do not prolapse). Impression: - Diverticulosis in the entire examined colon. Likely source of bleeding. - Internal hemorrhoids.   04/07/2021 Surgery   PROCEDURES:  Exam under anesthesia Diagnostic laparoscopy Exploratory laparotomy Lysis of adhesions (>30 minutes) Infragastric omentectomy Total abdominal hysterectomy and bilateral salpingo-oophorectomy Optimal (R0) interval tumor debulking  SURGEON: Surgeon(s) and Role: Leida Lauth, MD - Primary * Salinaro, Ivar Drape, MD - Resident - Assisting * Gloris Manchester, Kendrick Ranch, MD - Fellow  INDICATION(S): 72 y.o. who presented with carcinomatosis and mildly elevated CA125 to 95 with biopsy showing mullerian adenocarcinoma, s/p 3 cycles of neoadjuvant chemotherapy with normalization of CA125 and decreased disease burden on imaging.  OPERATIVE FINDINGS:  On exam, no palpable masses. On laparoscopy, omentum thickened and retracted along the transverse colon, pelvic adhesive disease. On laparotomy, omentum thickened, retracted, and densely adherent to the underlying colon mesentery, but without definitive viable tumor. Adhesions of the omentum to  the descending colon, rectosigmoid colon to the left adnexa and posterior uterus and cervix, loop of ileum to the right adnexa, and bladder to anterior uterus and cervix. Transverse colon with small diverticulum that was over-sewed. Appendix normal and retrocecal. No grossly visible tumor in the abdomen including smooth diaphragm and liver surface, normal stomach, small bowel, colon, and normal appearing uterus, bilateral fallopian tubes, and bilateral ovaries.  At the conclusion of the case, no gross residual disease remained (R0).    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   05/22/2021 - 06/19/2021 Chemotherapy   Patient is on Treatment Plan : OVARIAN Carboplatin (AUC 6) / Paclitaxel (175) q21d x 6 cycles      06/19/2021 Tumor Marker   Patient's tumor was tested for the following markers: CA-125. Results of the tumor marker test revealed 3.   07/22/2021 Tumor Marker   Patient's tumor was tested for the following markers: CA-125. Results of the tumor marker test revealed 3.1.   07/23/2021 Imaging   1. No evidence metastatic ovarian carcinoma. 2. No peritoneal nodularity or fluid. 3. Post  hysterectomy and oophorectomy       08/19/2021 Procedure   Successful removal of RIGHT chest implanted Port-A-Cath, as above   09/24/2021 Tumor Marker   Patient's tumor was tested for the following markers: CA-125. Results of the tumor marker test revealed 3.7.   01/20/2022 Imaging   1. Stable. No evidence for metastatic disease in the abdomen or pelvis. 2. Tiny gas bubble identified in the bladder lumen. Correlation witfor h recent instrumentation recommended. In the absence of recent instrumentation, bladder infection would be a consideration. 3. Tiny hiatal  hernia. 4. Left colonic diverticulosis without diverticulitis. 5. Aortic Atherosclerosis (ICD10-I70.0).   02/01/2022 Tumor Marker   Patient's tumor was tested for the following markers: CA-125. Results of the tumor marker test revealed 3.8.   05/06/2022 Tumor Marker   Patient's tumor was tested for the following markers: CA-125. Results of the tumor marker test revealed 3.4.   05/06/2022 Imaging   1. Stable examination without evidence of local recurrence or metastatic disease within the chest, abdomen, or pelvis. 2. Left-sided colonic diverticulosis without findings of acute diverticulitis. 3. Mosaic attenuation of the lung bases with mild diffuse bronchial wall thickening, findings which can be seen in the setting of small airways disease. 4.  Aortic Atherosclerosis (ICD10-I70.0).   08/09/2022 Tumor Marker   Patient's tumor was tested for the following markers: CA-125. Results of the tumor marker test revealed 2.8.   10/28/2022 Tumor Marker   Patient's tumor was tested for the following markers: CA-125. Results of the tumor marker test revealed 2.9.     PHYSICAL EXAMINATION: ECOG PERFORMANCE STATUS: 1 - Symptomatic but completely ambulatory  Vitals:   10/29/22 0817  BP: (!) 134/58  Pulse: 63  Resp: 12  Temp: (!) 97.5 F (36.4 C)  SpO2: 94%   Filed Weights   10/29/22 0817  Weight: 230 lb 8 oz (104.6 kg)    GENERAL:alert, no distress and comfortable  NEURO: alert & oriented x 3 with fluent speech, no focal motor/sensory deficits  LABORATORY DATA:  I have reviewed the data as listed    Component Value Date/Time   NA 139 10/26/2022 0815   NA 143 12/20/2019 0829   K 3.9 10/26/2022 0815   CL 103 10/26/2022 0815   CO2 29 10/26/2022 0815   GLUCOSE 102 (H) 10/26/2022 0815   BUN 14 10/26/2022 0815   BUN 16 12/20/2019 0829   CREATININE 0.62 10/26/2022 0815   CALCIUM 9.0 10/26/2022 0815   PROT 7.0 10/26/2022 0815   PROT 6.4 02/04/2020 1036   ALBUMIN 4.0 10/26/2022  0815   ALBUMIN 4.2 02/04/2020 1036   AST 17 10/26/2022 0815   ALT 17 10/26/2022 0815   ALKPHOS 63 10/26/2022 0815   BILITOT 0.9 10/26/2022 0815   GFRNONAA >60 10/26/2022 0815   GFRAA 104 12/20/2019 0829    No results found for: "SPEP", "UPEP"  Lab Results  Component Value Date   WBC 2.7 (L) 10/26/2022   NEUTROABS 1.4 (L) 10/26/2022   HGB 13.1 10/26/2022   HCT 39.8 10/26/2022   MCV 94.1 10/26/2022   PLT 206 10/26/2022      Chemistry      Component Value Date/Time   NA 139 10/26/2022 0815   NA 143 12/20/2019 0829   K 3.9 10/26/2022 0815   CL 103 10/26/2022 0815   CO2 29 10/26/2022 0815   BUN 14 10/26/2022 0815   BUN 16 12/20/2019 0829   CREATININE 0.62 10/26/2022 0815      Component Value Date/Time  CALCIUM 9.0 10/26/2022 0815   ALKPHOS 63 10/26/2022 0815   AST 17 10/26/2022 0815   ALT 17 10/26/2022 0815   BILITOT 0.9 10/26/2022 0815

## 2022-10-29 NOTE — Assessment & Plan Note (Signed)
She continues to have difficulties with regular bowel movement I recommend the patient to reduce her fiber intake and to continue on laxative therapy As above, we will order CT imaging to assess

## 2022-10-29 NOTE — Assessment & Plan Note (Signed)
Her vitamin B12 is borderline low I recommend oral vitamin B12 supplement

## 2022-10-29 NOTE — Assessment & Plan Note (Signed)
The patient is extremely motivated Despite her best effort, she has hit a plateau with her weight loss journey I gave her resources to consider hiring training to help with her weight loss efforts

## 2022-10-29 NOTE — Assessment & Plan Note (Signed)
The patient expressed significant concern over her wellbeing She continues to have intermittent abdominal discomfort along with difficulties with bowel movement Her tumor marker is normal Given her symptoms, we will move her imaging studies to be done this month Continue supportive care

## 2022-11-09 ENCOUNTER — Ambulatory Visit (HOSPITAL_BASED_OUTPATIENT_CLINIC_OR_DEPARTMENT_OTHER)
Admission: RE | Admit: 2022-11-09 | Discharge: 2022-11-09 | Disposition: A | Discharge status: Home / Self Care | Payer: Medicare Other | Source: Ambulatory Visit | Attending: Hematology and Oncology | Admitting: Hematology and Oncology

## 2022-11-09 DIAGNOSIS — C5701 Malignant neoplasm of right fallopian tube: Secondary | ICD-10-CM | POA: Insufficient documentation

## 2022-11-09 MED ORDER — IOHEXOL 300 MG/ML  SOLN
100.0000 mL | Freq: Once | INTRAMUSCULAR | Status: AC | PRN
  Administered 2022-11-09: 100 mL via INTRAVENOUS

## 2022-11-10 ENCOUNTER — Ambulatory Visit (HOSPITAL_BASED_OUTPATIENT_CLINIC_OR_DEPARTMENT_OTHER): Payer: Medicare Other

## 2022-11-16 ENCOUNTER — Encounter: Payer: Self-pay | Admitting: Hematology and Oncology

## 2022-11-16 ENCOUNTER — Inpatient Hospital Stay (HOSPITAL_BASED_OUTPATIENT_CLINIC_OR_DEPARTMENT_OTHER): Payer: Medicare Other | Admitting: Hematology and Oncology

## 2022-11-16 VITALS — BP 141/56 | HR 65 | Temp 98.2°F | Resp 18 | Ht 65.0 in | Wt 242.6 lb

## 2022-11-16 DIAGNOSIS — K5909 Other constipation: Secondary | ICD-10-CM | POA: Diagnosis not present

## 2022-11-16 DIAGNOSIS — C5701 Malignant neoplasm of right fallopian tube: Secondary | ICD-10-CM | POA: Diagnosis not present

## 2022-11-16 NOTE — Assessment & Plan Note (Signed)
I have reviewed CT imaging studies with the patient There are no signs of cancer recurrence We discussed the importance of bowel habit management and weight loss

## 2022-11-16 NOTE — Progress Notes (Signed)
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   All questions were answered. The patient knows to call the clinic with any problems, questions or concerns. The total time spent in the appointment was 25 minutes encounter with patients including review of chart and various tests results, discussions about plan of care and coordination of care plan   Artis Delay, MD 11/16/2022 10:16 AM  INTERVAL HISTORY: Please see below for problem oriented charting. she returns for review of test results I spent majority of our time reviewing CT imaging results  REVIEW OF SYSTEMS:   Constitutional: Denies fevers, chills or abnormal weight loss Eyes: Denies blurriness of vision Ears, nose, mouth, throat, and face: Denies mucositis or sore throat Respiratory: Denies cough, dyspnea or wheezes Cardiovascular: Denies palpitation, chest discomfort or lower extremity swelling Gastrointestinal:  Denies nausea, heartburn or change in bowel habits Skin: Denies abnormal skin rashes Lymphatics: Denies new lymphadenopathy or easy bruising Neurological:Denies numbness, tingling or new weaknesses Behavioral/Psych: Mood is stable, no new changes  All other systems were reviewed with the patient and are negative.  I have reviewed the past medical history,  past surgical history, social history and family history with the patient and they are unchanged from previous note.  ALLERGIES:  is allergic to macrobid [nitrofurantoin], ciprofloxacin, codeine, and azithromycin.  MEDICATIONS:  Current Outpatient Medications  Medication Sig Dispense Refill   acetaminophen (TYLENOL) 500 MG tablet Take 1,000 mg by mouth every 6 (six) hours as needed for mild pain, moderate pain, fever or headache.     albuterol (VENTOLIN HFA) 108 (90 Base) MCG/ACT inhaler Inhale 2 puffs into the lungs every 6 (six) hours as needed for wheezing or shortness of breath. 8 g 6   atorvastatin (LIPITOR) 40 MG tablet Take 1 tablet (40 mg total) by mouth daily. 90 tablet 3   Biotin 1000 MCG CHEW      cholecalciferol (VITAMIN D3) 25 MCG (1000 UNIT) tablet Take 1,000 Units by mouth daily.     co-enzyme Q-10 30 MG capsule Take by mouth.     estradiol (ESTRACE VAGINAL) 0.1 MG/GM vaginal cream Place 1 Applicatorful vaginally at bedtime. 42.5 g 12   hydrocortisone 2.5 % cream Apply topically.     loratadine (CLARITIN) 10 MG tablet Take 10 mg by mouth daily.     losartan (COZAAR) 50 MG tablet Take 50 mg by mouth daily.     Melatonin 5 MG CHEW Chew 20 mg by mouth at bedtime.     metFORMIN (GLUCOPHAGE) 500 MG tablet Take 500 mg by mouth 2 (two) times daily.     Omega 3 1000 MG CAPS Take 1 capsule by mouth daily.     pantoprazole (PROTONIX) 40 MG tablet Take 1 tablet (40 mg total) by mouth daily. 30 tablet 1   polyethylene glycol (MIRALAX / GLYCOLAX) 17 g packet Take 8.5 g by mouth 2 (two)  times daily.     Probiotic Product (PROBIOTIC BLEND PO) Take by mouth.     sennosides-docusate sodium (SENOKOT-S) 8.6-50 MG tablet Take 1 tablet by mouth 2 (two) times daily as needed for constipation.     UNABLE TO FIND Take 1 capsule by mouth daily. Med Name: [place] Tail Mushroom     No current facility-administered medications for this visit.    SUMMARY OF ONCOLOGIC HISTORY: Oncology History Overview  Note  BRCA1/2 negative testing High grade serous, near complete pathological response to neoadjuvant chemo   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HCC) (Resolved)  12/30/2020 Initial Diagnosis   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HCC)   01/12/2021 - 03/11/2021 Chemotherapy   Patient is on Treatment Plan : OVARIAN Carboplatin (AUC 6) / Paclitaxel (175) q21d x 6 cycles     Adenocarcinoma of right fallopian tube (HCC)  07/18/2018 Procedure   Colonoscopy - Non-bleeding internal hemorrhoids. - Diverticulosis in the sigmoid colon and in the descending colon. - No specimens collected. - Blood in stool without cause found on endoscopic exam   05/08/2020 Imaging   1. Bladder is decompressed though demonstrates significant perivesicular hazy stranding, mucosal hyperemia and edematous mural thickening. Findings are suggestive of cystitis. Correlate with urinalysis. No abnormal perinephric or periureteral stranding or other features to suggest an ascending tract infection at this time. 2. Colonic diverticulosis without evidence of acute diverticulitis. 3. Aortic Atherosclerosis (ICD10-I70.0).   12/28/2020 Imaging   1. 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 Differential for peritoneal carcinomatosis is primarily neoplastic and includes cancers of the ovary, appendix, colon, pancreas and stomach. PET-CT may be helpful for further characterization. Tissue sampling of the mesentery may be eventually required for diagnosis. 2. Mildly distended small bowel loops throughout the abdomen and pelvis, with fluid and associated air-fluid levels throughout the nondistended small bowel, suggesting a mild ileus versus partial small bowel obstruction. Favor partial small bowel obstruction with transition zone in the RIGHT lower quadrant, likely related to aforementioned neoplastic  peritoneal implants and/or adhesions. 3. Small free fluid within the RIGHT upper quadrant and LEFT upper quadrant. No abscess collection seen. No free intraperitoneal air seen. 4. Extensive colonic diverticulosis without evidence of acute diverticulitis. 5. Small chronic pleural effusions with associated atelectasis.   12/29/2020 Tumor Marker   Patient's tumor was tested for the following markers: CA-125. Results of the tumor marker test revealed 95.1.   12/30/2020 Surgery   Preoperative diagnosis: abdominal nodules   Postoperative diagnosis: same   Procedure: diagnostic laparoscopy with abdominal wall biopsy   Surgeon: Feliciana Rossetti, M.D.    Indications for procedure: Tonnya Kohrt is a 72 y.o. year old female with symptoms of nausea, vomiting, and abdominal pain. Work up was concerning for cancer with abdominal wall studding.   Description of procedure: The patient was brought into the operative suite. Anesthesia was administered with General endotracheal anesthesia. WHO checklist was applied. The patient was then placed in supine position. The area was prepped and draped in the usual sterile fashion.   A small left subcostal incision was made. A 5mm trocar was used to gain access to the peritoneal cavity by optical entry technique. Pneumoperitoneum was applied with a high flow and low pressure. The laparoscope was reinserted to confirm position.   On initial visualization of the abdomen, there were multiple nodules throughout the abdominal wall. There were multiple nodules over the small intestine and mesentery. There was some  loops of small intestine that were matted together. There was a large white mass in the left upper quadrant. Multiple areas of peritoneum were removed with harmonic scalpel. The large mass was inspected. It appeared to be related to the colon and omentum. Grasper was used to remove some superficial tissue and was sent for culture.   The abdomen was  desufflated. The incisions were closed with 4-0 monocryl subcuticular stitch.   12/30/2020 Pathology Results   FINAL MICROSCOPIC DIAGNOSIS:   A. ABDOMINAL WALL, NODULE, EXCISION:  -  Metastatic adenocarcinoma  -  See comment   B. PERI COLONIC EXUDATE, EXCISION:  -  Metastatic adenocarcinoma  -  See comment   COMMENT:   By immunohistochemistry, the neoplastic cells are positive for cytokeratin 7, PAX8 and WT1 but negative for cytokeratin 20, CDX2, p53 and TTF-1.  The morphology and immunophenotype are consistent with a gynecologic primary.     01/01/2021 Initial Diagnosis   Peritoneal carcinomatosis (HCC)   01/08/2021 Cancer Staging   Staging form: Ovary, Fallopian Tube, and Primary Peritoneal Carcinoma, AJCC 8th Edition - Clinical stage from 01/08/2021: cT3, cN0, cM0 - Signed by Artis Delay, MD on 01/08/2021 Stage prefix: Initial diagnosis   01/12/2021 - 03/11/2021 Chemotherapy   Patient is on Treatment Plan : OVARIAN Carboplatin (AUC 6) / Paclitaxel (175) q21d x 6 cycles     01/17/2021 Imaging   MRI brain No evidence of intracranial metastatic disease or other acute abnormality   02/12/2021 Genetic Testing   No pathogenic variants detected in [facility].  The report date is February 12, 2021.   The [facility] gene panel offered by [employer] includes sequencing and deletion/duplication testing of the following 48 genes: APC, ATM, AXIN2, BAP1, BARD1, BMPR1A, BRCA1, BRCA2, BRIP1, CHD1, CDK4, CDKN2A(p16 and p14ARF), CHEK2, CTNNA1, EGFR, EPCAM, FH, FLCN, GREM1, HOXB13, MEN1, MET, MITF , MLH1, MSH2, MSH3, MSH6, MUTYH, NTHL1, PALB2, PMS2, POLD1, POLE, PTEN, RAD51C, RAD51D, RET, SDHA, SDHB, SDHC, SDHD, SMAD4, STK11,TERT, TP53, TSC1, TSC2, and VHL.  Unless otherwise specified, all coding regions and flanking non-coding regions are analyzed for sequence variation. Analysis of flanking intronic regions typically do not extend more than 20 bp before and 10 bp after each  exon, though the exact region may be adjusted based on the presence of either potentially significant variants or highly repetitive sequences. Coding regions and proximal promoter regions near the transcription start sites are analyzed for large deletions or duplications. Specific genes are tested only for sequence and/or CNVs within limited regions. Limited clinically relevant regions are included for EGFR (sequencing and CNV analysis of exons 18-21),RET (sequencing and CNV analysis of exons 5, 8, 10, 11, and 13-16), and MITF (sequencing of position c.952). Only CNV analysis of the last two exons of EPCAM is performed. CNV analysis of GREM1 includes the upstream region overlapping the adjacent gene SCG5. MSH3 exon 1 contains a long polyalanine repeat that can interfere with variant calling; therefore, MSH3 analysis excludes c.121 to c.237. Only sequence analysis of the exons encompassing the exonuclease domains of these genes is performed (POLD1 c.841 to c.1686, POLE c.802 to c.1473). Limited promoter regions in selected genes undergo sequence analysis including TERT (c.-71 to c.-1), and APC Promoter 1B (c.-195 to c.-190 and c.-125 ([id number]).   HRD testing pending.    03/02/2021 Imaging   Outside imaging at [facility] improved peritoneal carcinomatosis. No evidence of new metastatic disease or acute abdominal pathology.    03/02/2021 Imaging   Outside CT imaging done  at [facility] improved peritoneal carcinomatosis. No evidence of new metastatic disease or acute abdominal pathology   03/02/2021 - 03/03/2021 Hospital Admission   She was hospitalized briefly for evaluation of hematochezia at [facility].  She underwent CT imaging and colonoscopy and was subsequently discharged   03/03/2021 Procedure   Colonoscopy was performed at [facility]  Findings: Diverticula were found in the entire colon. Internal hemorrhoids were found during retroflexion.  The hemorrhoids were Grade I (internal hemorrhoids that  do not prolapse). Impression: - Diverticulosis in the entire examined colon. Likely source of bleeding. - Internal hemorrhoids.   04/07/2021 Surgery   PROCEDURES:  Exam under anesthesia Diagnostic laparoscopy Exploratory laparotomy Lysis of adhesions (>30 minutes) Infragastric omentectomy Total abdominal hysterectomy and bilateral salpingo-oophorectomy Optimal (R0) interval tumor debulking  SURGEON: Surgeon(s) and Role: Leida Lauth, MD - Primary * Salinaro, Ivar Drape, MD - Resident - Assisting * Gloris Manchester, Kendrick Ranch, MD - Fellow  INDICATION(S): 72 y.o. who presented with carcinomatosis and mildly elevated CA125 to 95 with biopsy showing mullerian adenocarcinoma, s/p 3 cycles of neoadjuvant chemotherapy with normalization of CA125 and decreased disease burden on imaging.  OPERATIVE FINDINGS:  On exam, no palpable masses. On laparoscopy, omentum thickened and retracted along the transverse colon, pelvic adhesive disease. On laparotomy, omentum thickened, retracted, and densely adherent to the underlying colon mesentery, but without definitive viable tumor. Adhesions of the omentum to the descending colon, rectosigmoid colon to the left adnexa and posterior uterus and cervix, loop of ileum to the right adnexa, and bladder to anterior uterus and cervix. Transverse colon with small diverticulum that was over-sewed. Appendix normal and retrocecal. No grossly visible tumor in the abdomen including smooth diaphragm and liver surface, normal stomach, small bowel, colon, and normal appearing uterus, bilateral fallopian tubes, and bilateral ovaries.  At the conclusion of the case, no gross residual disease remained (R0).    04/07/2021 Pathology Results   Final pathology showed residual high-grade serous carcinoma of the right fallopian tube with STIC lesion, with negative ovary, negative omentum (with treated tumor), negative uterus, left adnexa (path report error saying negative right  adnexa), and washings.   05/22/2021 - 06/19/2021 Chemotherapy   Patient is on Treatment Plan : OVARIAN Carboplatin (AUC 6) / Paclitaxel (175) q21d x 6 cycles      06/19/2021 Tumor Marker   Patient's tumor was tested for the following markers: CA-125. Results of the tumor marker test revealed 3.   07/22/2021 Tumor Marker   Patient's tumor was tested for the following markers: CA-125. Results of the tumor marker test revealed 3.1.   07/23/2021 Imaging   1. No evidence metastatic ovarian carcinoma. 2. No peritoneal nodularity or fluid. 3. Post hysterectomy and oophorectomy       08/19/2021 Procedure   Successful removal of RIGHT chest implanted Port-A-Cath, as above   09/24/2021 Tumor Marker   Patient's tumor was tested for the following markers: CA-125. Results of the tumor marker test revealed 3.7.   01/20/2022 Imaging   1. Stable. No evidence for metastatic disease in the abdomen or pelvis. 2. Tiny gas bubble identified in the bladder lumen. Correlation witfor h recent instrumentation recommended. In the absence of recent instrumentation, bladder infection would be a consideration. 3. Tiny hiatal hernia. 4. Left colonic diverticulosis without diverticulitis. 5. Aortic Atherosclerosis (ICD10-I70.0).   02/01/2022 Tumor Marker   Patient's tumor was tested for the following markers: CA-125. Results of the tumor marker test revealed 3.8.   05/06/2022 Tumor Marker   Patient's  tumor was tested for the following markers: CA-125. Results of the tumor marker test revealed 3.4.   05/06/2022 Imaging   1. Stable examination without evidence of local recurrence or metastatic disease within the chest, abdomen, or pelvis. 2. Left-sided colonic diverticulosis without findings of acute diverticulitis. 3. Mosaic attenuation of the lung bases with mild diffuse bronchial wall thickening, findings which can be seen in the setting of small airways disease. 4.  Aortic Atherosclerosis (ICD10-I70.0).    08/09/2022 Tumor Marker   Patient's tumor was tested for the following markers: CA-125. Results of the tumor marker test revealed 2.8.   10/28/2022 Tumor Marker   Patient's tumor was tested for the following markers: CA-125. Results of the tumor marker test revealed 2.9.   11/12/2022 Imaging   CT ABDOMEN PELVIS W CONTRAST  Result Date: 11/12/2022 CLINICAL DATA:  Ovarian cancer restaging * Tracking Code: BO * EXAM: CT ABDOMEN AND PELVIS WITH CONTRAST TECHNIQUE: Multidetector CT imaging of the abdomen and pelvis was performed using the standard protocol following bolus administration of intravenous contrast. RADIATION DOSE REDUCTION: This exam was performed according to the departmental dose-optimization program which includes automated exposure control, adjustment of the mA and/or kV according to patient size and/or use of iterative reconstruction technique. CONTRAST:  OMNIPAQUE IOHEXOL 300 MG/ML  SOLN COMPARISON:  CT scan 06/07/2022 FINDINGS: Lower chest: Mild scarring in the right middle lobe medially. Mild cardiomegaly. Hepatobiliary: Unremarkable Pancreas: Unremarkable Spleen: Unremarkable Adrenals/Urinary Tract: Unremarkable Stomach/Bowel: Prominent stool throughout the colon favors constipation. Vascular/Lymphatic: Mild systemic atherosclerosis including the abdominal aorta. No pathologic adenopathy. Reproductive: Uterus absent. No nodularity along the vaginal cuff. Adnexa unremarkable. Other: No significant nodularity along the omentum or peritoneal surfaces. No ascites. Musculoskeletal: Lumbar spondylosis and degenerative disc disease resulting in multilevel impingement. Rectus diastasis with mild ventral hernia containing adipose tissue and bowel as on image 56 series 2. No strangulation or complicating feature. IMPRESSION: 1. No findings of active malignancy. 2. Prominent stool throughout the colon favors constipation. 3. Mild cardiomegaly. 4. Lumbar spondylosis and degenerative disc disease  resulting in multilevel impingement. 5. Rectus diastasis with mild ventral hernia containing adipose tissue and bowel. No strangulation or complicating feature. 6. Mild systemic atherosclerosis. Aortic Atherosclerosis (ICD10-I70.0). Electronically Signed   By: Gaylyn Rong M.D.   On: 11/12/2022 14:34        PHYSICAL EXAMINATION: ECOG PERFORMANCE STATUS: 0 - Asymptomatic  Vitals:   11/16/22 0822  BP: (!) 141/56  Pulse: 65  Resp: 18  Temp: 98.2 F (36.8 C)  SpO2: 97%   Filed Weights   11/16/22 0822  Weight: 242 lb 9.6 oz (110 kg)    GENERAL:alert, no distress and comfortable  NEURO: alert & oriented x 3 with fluent speech, no focal motor/sensory deficits  LABORATORY DATA:  I have reviewed the data as listed    Component Value Date/Time   NA 139 10/26/2022 0815   NA 143 12/20/2019 0829   K 3.9 10/26/2022 0815   CL 103 10/26/2022 0815   CO2 29 10/26/2022 0815   GLUCOSE 102 (H) 10/26/2022 0815   BUN 14 10/26/2022 0815   BUN 16 12/20/2019 0829   CREATININE 0.62 10/26/2022 0815   CALCIUM 9.0 10/26/2022 0815   PROT 7.0 10/26/2022 0815   PROT 6.4 02/04/2020 1036   ALBUMIN 4.0 10/26/2022 0815   ALBUMIN 4.2 02/04/2020 1036   AST 17 10/26/2022 0815   ALT 17 10/26/2022 0815   ALKPHOS 63 10/26/2022 0815   BILITOT 0.9 10/26/2022 0815  GFRNONAA >60 10/26/2022 0815   GFRAA 104 12/20/2019 0829    No results found for: "SPEP", "UPEP"  Lab Results  Component Value Date   WBC 2.7 (L) 10/26/2022   NEUTROABS 1.4 (L) 10/26/2022   HGB 13.1 10/26/2022   HCT 39.8 10/26/2022   MCV 94.1 10/26/2022   PLT 206 10/26/2022      Chemistry      Component Value Date/Time   NA 139 10/26/2022 0815   NA 143 12/20/2019 0829   K 3.9 10/26/2022 0815   CL 103 10/26/2022 0815   CO2 29 10/26/2022 0815   BUN 14 10/26/2022 0815   BUN 16 12/20/2019 0829   CREATININE 0.62 10/26/2022 0815      Component Value Date/Time   CALCIUM 9.0 10/26/2022 0815   ALKPHOS 63 10/26/2022 0815    AST 17 10/26/2022 0815   ALT 17 10/26/2022 0815   BILITOT 0.9 10/26/2022 0815       RADIOGRAPHIC STUDIES: Reviewed imaging study with the patient I have personally reviewed the radiological images as listed and agreed with the findings in the report. CT ABDOMEN PELVIS W CONTRAST  Result Date: 11/12/2022 CLINICAL DATA:  Ovarian cancer restaging * Tracking Code: BO * EXAM: CT ABDOMEN AND PELVIS WITH CONTRAST TECHNIQUE: Multidetector CT imaging of the abdomen and pelvis was performed using the standard protocol following bolus administration of intravenous contrast. RADIATION DOSE REDUCTION: This exam was performed according to the departmental dose-optimization program which includes automated exposure control, adjustment of the mA and/or kV according to patient size and/or use of iterative reconstruction technique. CONTRAST:  OMNIPAQUE IOHEXOL 300 MG/ML  SOLN COMPARISON:  CT scan 06/07/2022 FINDINGS: Lower chest: Mild scarring in the right middle lobe medially. Mild cardiomegaly. Hepatobiliary: Unremarkable Pancreas: Unremarkable Spleen: Unremarkable Adrenals/Urinary Tract: Unremarkable Stomach/Bowel: Prominent stool throughout the colon favors constipation. Vascular/Lymphatic: Mild systemic atherosclerosis including the abdominal aorta. No pathologic adenopathy. Reproductive: Uterus absent. No nodularity along the vaginal cuff. Adnexa unremarkable. Other: No significant nodularity along the omentum or peritoneal surfaces. No ascites. Musculoskeletal: Lumbar spondylosis and degenerative disc disease resulting in multilevel impingement. Rectus diastasis with mild ventral hernia containing adipose tissue and bowel as on image 56 series 2. No strangulation or complicating feature. IMPRESSION: 1. No findings of active malignancy. 2. Prominent stool throughout the colon favors constipation. 3. Mild cardiomegaly. 4. Lumbar spondylosis and degenerative disc disease resulting in multilevel impingement. 5.  Rectus diastasis with mild ventral hernia containing adipose tissue and bowel. No strangulation or complicating feature. 6. Mild systemic atherosclerosis. Aortic Atherosclerosis (ICD10-I70.0). Electronically Signed   By: Gaylyn Rong M.D.   On: 11/12/2022 14:34

## 2022-11-16 NOTE — Assessment & Plan Note (Signed)
She continues to have difficulties with regular bowel movement I recommend the patient to reduce her fiber intake and to continue on laxative therapy

## 2022-11-17 ENCOUNTER — Telehealth: Payer: Self-pay | Admitting: Hematology and Oncology

## 2022-11-17 NOTE — Telephone Encounter (Signed)
Spoke with patient confirming upcoming appointment  

## 2022-12-06 ENCOUNTER — Other Ambulatory Visit: Payer: Medicare Other

## 2022-12-07 ENCOUNTER — Other Ambulatory Visit: Payer: Medicare Other

## 2022-12-09 ENCOUNTER — Ambulatory Visit: Payer: Medicare Other | Admitting: Hematology and Oncology

## 2023-01-13 ENCOUNTER — Telehealth: Payer: Self-pay | Admitting: Hematology and Oncology

## 2023-02-14 ENCOUNTER — Other Ambulatory Visit: Payer: Medicare Other

## 2023-02-14 ENCOUNTER — Inpatient Hospital Stay: Payer: Medicare Other | Attending: Hematology and Oncology

## 2023-02-14 DIAGNOSIS — Z7984 Long term (current) use of oral hypoglycemic drugs: Secondary | ICD-10-CM | POA: Insufficient documentation

## 2023-02-14 DIAGNOSIS — C786 Secondary malignant neoplasm of retroperitoneum and peritoneum: Secondary | ICD-10-CM | POA: Diagnosis not present

## 2023-02-14 DIAGNOSIS — M47816 Spondylosis without myelopathy or radiculopathy, lumbar region: Secondary | ICD-10-CM | POA: Diagnosis not present

## 2023-02-14 DIAGNOSIS — M6208 Separation of muscle (nontraumatic), other site: Secondary | ICD-10-CM | POA: Insufficient documentation

## 2023-02-14 DIAGNOSIS — I517 Cardiomegaly: Secondary | ICD-10-CM | POA: Diagnosis not present

## 2023-02-14 DIAGNOSIS — K5900 Constipation, unspecified: Secondary | ICD-10-CM | POA: Diagnosis not present

## 2023-02-14 DIAGNOSIS — Z79899 Other long term (current) drug therapy: Secondary | ICD-10-CM | POA: Insufficient documentation

## 2023-02-14 DIAGNOSIS — K5730 Diverticulosis of large intestine without perforation or abscess without bleeding: Secondary | ICD-10-CM | POA: Diagnosis not present

## 2023-02-14 DIAGNOSIS — C5701 Malignant neoplasm of right fallopian tube: Secondary | ICD-10-CM | POA: Diagnosis present

## 2023-02-14 DIAGNOSIS — I700 Atherosclerosis of aorta: Secondary | ICD-10-CM | POA: Insufficient documentation

## 2023-02-14 DIAGNOSIS — K640 First degree hemorrhoids: Secondary | ICD-10-CM | POA: Insufficient documentation

## 2023-02-14 DIAGNOSIS — R140 Abdominal distension (gaseous): Secondary | ICD-10-CM | POA: Insufficient documentation

## 2023-02-14 DIAGNOSIS — J90 Pleural effusion, not elsewhere classified: Secondary | ICD-10-CM | POA: Diagnosis not present

## 2023-02-14 DIAGNOSIS — Z9221 Personal history of antineoplastic chemotherapy: Secondary | ICD-10-CM | POA: Insufficient documentation

## 2023-02-14 DIAGNOSIS — K439 Ventral hernia without obstruction or gangrene: Secondary | ICD-10-CM | POA: Insufficient documentation

## 2023-02-14 LAB — CBC WITH DIFFERENTIAL/PLATELET
Abs Immature Granulocytes: 0.01 10*3/uL (ref 0.00–0.07)
Basophils Absolute: 0 10*3/uL (ref 0.0–0.1)
Basophils Relative: 1 %
Eosinophils Absolute: 0.2 10*3/uL (ref 0.0–0.5)
Eosinophils Relative: 6 %
HCT: 38.2 % (ref 36.0–46.0)
Hemoglobin: 12.6 g/dL (ref 12.0–15.0)
Immature Granulocytes: 0 %
Lymphocytes Relative: 27 %
Lymphs Abs: 1 10*3/uL (ref 0.7–4.0)
MCH: 30.7 pg (ref 26.0–34.0)
MCHC: 33 g/dL (ref 30.0–36.0)
MCV: 92.9 fL (ref 80.0–100.0)
Monocytes Absolute: 0.3 10*3/uL (ref 0.1–1.0)
Monocytes Relative: 9 %
Neutro Abs: 2.1 10*3/uL (ref 1.7–7.7)
Neutrophils Relative %: 57 %
Platelets: 207 10*3/uL (ref 150–400)
RBC: 4.11 MIL/uL (ref 3.87–5.11)
RDW: 13.4 % (ref 11.5–15.5)
WBC: 3.6 10*3/uL — ABNORMAL LOW (ref 4.0–10.5)
nRBC: 0 % (ref 0.0–0.2)

## 2023-02-14 LAB — COMPREHENSIVE METABOLIC PANEL
ALT: 12 U/L (ref 0–44)
AST: 16 U/L (ref 15–41)
Albumin: 4.2 g/dL (ref 3.5–5.0)
Alkaline Phosphatase: 72 U/L (ref 38–126)
Anion gap: 7 (ref 5–15)
BUN: 12 mg/dL (ref 8–23)
CO2: 31 mmol/L (ref 22–32)
Calcium: 9.6 mg/dL (ref 8.9–10.3)
Chloride: 105 mmol/L (ref 98–111)
Creatinine, Ser: 0.68 mg/dL (ref 0.44–1.00)
GFR, Estimated: >60 mL/min (ref >60)
Glucose, Bld: 109 mg/dL — ABNORMAL HIGH (ref 70–99)
Potassium: 4.5 mmol/L (ref 3.5–5.1)
Sodium: 143 mmol/L (ref 135–145)
Total Bilirubin: 0.7 mg/dL (ref 0.3–1.2)
Total Protein: 6.8 g/dL (ref 6.5–8.1)

## 2023-02-15 LAB — CA 125: Cancer Antigen (CA) 125: 2.5 U/mL (ref 0.0–38.1)

## 2023-02-18 ENCOUNTER — Encounter: Payer: Self-pay | Admitting: Hematology and Oncology

## 2023-02-18 ENCOUNTER — Inpatient Hospital Stay: Payer: Medicare Other | Admitting: Hematology and Oncology

## 2023-02-18 VITALS — BP 133/58 | HR 60 | Temp 97.7°F | Resp 18 | Ht 65.0 in | Wt 235.2 lb

## 2023-02-18 DIAGNOSIS — K5909 Other constipation: Secondary | ICD-10-CM

## 2023-02-18 DIAGNOSIS — C5701 Malignant neoplasm of right fallopian tube: Secondary | ICD-10-CM

## 2023-02-18 DIAGNOSIS — D72819 Decreased white blood cell count, unspecified: Secondary | ICD-10-CM

## 2023-02-18 DIAGNOSIS — R140 Abdominal distension (gaseous): Secondary | ICD-10-CM

## 2023-02-18 DIAGNOSIS — K640 First degree hemorrhoids: Secondary | ICD-10-CM | POA: Diagnosis not present

## 2023-02-18 NOTE — Progress Notes (Signed)
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[facility]) The patient felt that she had might have cancer recurrence She felt bloated and have intermittent abdominal discomfort She continues to struggle with intermittent chronic constipation Her tumor marker is normal However, just to be sure, I will order CT imaging to assess  Abdominal bloating Due to her intermittent chronic symptoms, it could be related to either constipation or cancer recurrence I will order CT imaging to assess  Other constipation She continues to have difficulties with regular bowel movement I recommend the patient to reduce her fiber intake and to continue on laxative therapy  Leukopenia Last B12 level was adequate.  This could be related to her prior treatment She is not symptomatic Observe only  Orders Placed This Encounter  Procedures   CT ABDOMEN PELVIS W CONTRAST    Standing Status:   Future    Standing Expiration Date:   02/18/2024    Order Specific Question:   If indicated for the ordered procedure, I authorize the administration of contrast media per Radiology protocol    Answer:   Yes    Order Specific Question:   Does the patient have a contrast media/X-ray dye allergy?    Answer:   No    Order Specific Question:   Preferred imaging location?    Answer:   [facility] Specific Question:   If indicated for the ordered procedure, I authorize the administration of oral contrast media per Radiology protocol    Answer:   Yes    All questions were answered. The patient knows to call the clinic with any problems, questions or concerns. The total time spent in the appointment was 40 minutes encounter with patients including  review of chart and various tests results, discussions about plan of care and coordination of care plan   Artis Delay, MD 02/18/2023 3:44 PM  INTERVAL HISTORY: Please see below for problem oriented charting. she returns for surveillance follow-up for history of fallopian tube cancer Since last time I saw her, she struggle recently with abdominal bloating, changes in bowel habits with constipation and intermittent abdominal pain for the last 3 weeks She has not lost weight No recent nausea No pelvic pain or abnormal discharge  REVIEW OF SYSTEMS:   Constitutional: Denies fevers, chills or abnormal weight loss Eyes: Denies blurriness of vision Ears, nose, mouth, throat, and face: Denies mucositis or sore throat Respiratory: Denies cough, dyspnea or wheezes Cardiovascular: Denies palpitation, chest discomfort or lower extremity swelling Skin: Denies abnormal skin rashes Lymphatics: Denies new lymphadenopathy or easy bruising Neurological:Denies numbness, tingling or new weaknesses Behavioral/Psych: Mood is stable, no new changes  All other systems were reviewed with the patient and are negative.  I have reviewed the past medical history, past surgical history, social history and family history with the patient and they are unchanged from previous note.  ALLERGIES:  is allergic to macrobid [nitrofurantoin], ciprofloxacin, codeine, and azithromycin.  MEDICATIONS:  Current Outpatient Medications  Medication Sig Dispense Refill   acetaminophen (TYLENOL) 500 MG tablet Take 1,000 mg by mouth every 6 (six) hours as needed for mild pain, moderate pain, fever or headache.     albuterol (VENTOLIN HFA) 108 (90 Base) MCG/ACT inhaler Inhale 2 puffs into the  lungs every 6 (six) hours as needed for wheezing or shortness of breath. 8 g 6   atorvastatin (LIPITOR) 40 MG tablet Take 1 tablet (40 mg total) by mouth daily. 90 tablet 3   Biotin 1000 MCG CHEW      cholecalciferol (VITAMIN D3) 25 MCG (1000  UNIT) tablet Take 1,000 Units by mouth daily.     co-enzyme Q-10 30 MG capsule Take by mouth.     estradiol (ESTRACE VAGINAL) 0.1 MG/GM vaginal cream Place 1 Applicatorful vaginally at bedtime. 42.5 g 12   hydrocortisone 2.5 % cream Apply topically.     loratadine (CLARITIN) 10 MG tablet Take 10 mg by mouth daily.     losartan (COZAAR) 50 MG tablet Take 50 mg by mouth daily.     Melatonin 5 MG CHEW Chew 20 mg by mouth at bedtime.     metFORMIN (GLUCOPHAGE) 500 MG tablet Take 500 mg by mouth 2 (two) times daily.     Omega 3 1000 MG CAPS Take 1 capsule by mouth daily.     pantoprazole (PROTONIX) 40 MG tablet Take 1 tablet (40 mg total) by mouth daily. 30 tablet 1   polyethylene glycol (MIRALAX / GLYCOLAX) 17 g packet Take 8.5 g by mouth 2 (two) times daily.     Probiotic Product (PROBIOTIC BLEND PO) Take by mouth.     sennosides-docusate sodium (SENOKOT-S) 8.6-50 MG tablet Take 1 tablet by mouth 2 (two) times daily as needed for constipation.     UNABLE TO FIND Take 1 capsule by mouth daily. Med Name: [place] Tail Mushroom     No current facility-administered medications for this visit.    SUMMARY OF ONCOLOGIC HISTORY: Oncology History Overview Note  BRCA1/2 negative testing High grade serous, near complete pathological response to neoadjuvant chemo   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HCC) (Resolved)  12/30/2020 Initial Diagnosis   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HCC)   01/12/2021 - 03/11/2021 Chemotherapy   Patient is on Treatment Plan : OVARIAN Carboplatin (AUC 6) / Paclitaxel (175) q21d x 6 cycles     Adenocarcinoma of right fallopian tube (HCC)  07/18/2018 Procedure   Colonoscopy - Non-bleeding internal hemorrhoids. - Diverticulosis in the sigmoid colon and in the descending colon. - No specimens collected. - Blood in stool without cause found on endoscopic exam   05/08/2020 Imaging   1. Bladder is decompressed though demonstrates significant perivesicular hazy stranding, mucosal hyperemia and  edematous mural thickening. Findings are suggestive of cystitis. Correlate with urinalysis. No abnormal perinephric or periureteral stranding or other features to suggest an ascending tract infection at this time. 2. Colonic diverticulosis without evidence of acute diverticulitis. 3. Aortic Atherosclerosis (ICD10-I70.0).   12/28/2020 Imaging   1. 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 Differential for peritoneal carcinomatosis is primarily neoplastic and includes cancers of the ovary, appendix, colon, pancreas and stomach. PET-CT may be helpful for further characterization. Tissue sampling of the mesentery may be eventually required for diagnosis. 2. Mildly distended small bowel loops throughout the abdomen and pelvis, with fluid and associated air-fluid levels throughout the nondistended small bowel, suggesting a mild ileus versus partial small bowel obstruction. Favor partial small bowel obstruction with transition zone in the RIGHT lower quadrant, likely related to aforementioned neoplastic peritoneal implants and/or adhesions. 3. Small free fluid within the RIGHT upper quadrant and LEFT upper quadrant. No abscess collection seen. No free intraperitoneal air seen. 4. Extensive colonic  diverticulosis without evidence of acute diverticulitis. 5. Small chronic pleural effusions with associated atelectasis.   12/29/2020 Tumor Marker   Patient's tumor was tested for the following markers: CA-125. Results of the tumor marker test revealed 95.1.   12/30/2020 Surgery   Preoperative diagnosis: abdominal nodules   Postoperative diagnosis: same   Procedure: diagnostic laparoscopy with abdominal wall biopsy   Surgeon: Feliciana Rossetti, M.D.    Indications for procedure: Sloan Snawder is a 72 y.o. year old female with symptoms of nausea,  vomiting, and abdominal pain. Work up was concerning for cancer with abdominal wall studding.   Description of procedure: The patient was brought into the operative suite. Anesthesia was administered with General endotracheal anesthesia. WHO checklist was applied. The patient was then placed in supine position. The area was prepped and draped in the usual sterile fashion.   A small left subcostal incision was made. A 5mm trocar was used to gain access to the peritoneal cavity by optical entry technique. Pneumoperitoneum was applied with a high flow and low pressure. The laparoscope was reinserted to confirm position.   On initial visualization of the abdomen, there were multiple nodules throughout the abdominal wall. There were multiple nodules over the small intestine and mesentery. There was some loops of small intestine that were matted together. There was a large white mass in the left upper quadrant. Multiple areas of peritoneum were removed with harmonic scalpel. The large mass was inspected. It appeared to be related to the colon and omentum. Grasper was used to remove some superficial tissue and was sent for culture.   The abdomen was desufflated. The incisions were closed with 4-0 monocryl subcuticular stitch.   12/30/2020 Pathology Results   FINAL MICROSCOPIC DIAGNOSIS:   A. ABDOMINAL WALL, NODULE, EXCISION:  -  Metastatic adenocarcinoma  -  See comment   B. PERI COLONIC EXUDATE, EXCISION:  -  Metastatic adenocarcinoma  -  See comment   COMMENT:   By immunohistochemistry, the neoplastic cells are positive for cytokeratin 7, PAX8 and WT1 but negative for cytokeratin 20, CDX2, p53 and TTF-1.  The morphology and immunophenotype are consistent with a gynecologic primary.     01/01/2021 Initial Diagnosis   Peritoneal carcinomatosis (HCC)   01/08/2021 Cancer Staging   Staging form: Ovary, Fallopian Tube, and Primary Peritoneal Carcinoma, AJCC 8th Edition - Clinical stage from  01/08/2021: cT3, cN0, cM0 - Signed by Artis Delay, MD on 01/08/2021 Stage prefix: Initial diagnosis   01/12/2021 - 03/11/2021 Chemotherapy   Patient is on Treatment Plan : OVARIAN Carboplatin (AUC 6) / Paclitaxel (175) q21d x 6 cycles     01/17/2021 Imaging   MRI brain No evidence of intracranial metastatic disease or other acute abnormality   02/12/2021 Genetic Testing   No pathogenic variants detected in [facility].  The report date is February 12, 2021.   The [facility] gene panel offered by [employer] includes sequencing and deletion/duplication testing of the following 48 genes: APC, ATM, AXIN2, BAP1, BARD1, BMPR1A, BRCA1, BRCA2, BRIP1, CHD1, CDK4, CDKN2A(p16 and p14ARF), CHEK2, CTNNA1, EGFR, EPCAM, FH, FLCN, GREM1, HOXB13, MEN1, MET, MITF , MLH1, MSH2, MSH3, MSH6, MUTYH, NTHL1, PALB2, PMS2, POLD1, POLE, PTEN, RAD51C, RAD51D, RET, SDHA, SDHB, SDHC, SDHD, SMAD4, STK11,TERT, TP53, TSC1, TSC2, and VHL.  Unless otherwise specified, all coding regions and flanking non-coding regions are analyzed for sequence variation. Analysis of flanking intronic regions typically do not extend more than 20 bp before and 10 bp after each exon, though  the exact region may be adjusted based on the presence of either potentially significant variants or highly repetitive sequences. Coding regions and proximal promoter regions near the transcription start sites are analyzed for large deletions or duplications. Specific genes are tested only for sequence and/or CNVs within limited regions. Limited clinically relevant regions are included for EGFR (sequencing and CNV analysis of exons 18-21),RET (sequencing and CNV analysis of exons 5, 8, 10, 11, and 13-16), and MITF (sequencing of position c.952). Only CNV analysis of the last two exons of EPCAM is performed. CNV analysis of GREM1 includes the upstream region overlapping the adjacent gene SCG5. MSH3 exon 1 contains a long polyalanine repeat that can  interfere with variant calling; therefore, MSH3 analysis excludes c.121 to c.237. Only sequence analysis of the exons encompassing the exonuclease domains of these genes is performed (POLD1 c.841 to c.1686, POLE c.802 to c.1473). Limited promoter regions in selected genes undergo sequence analysis including TERT (c.-71 to c.-1), and APC Promoter 1B (c.-195 to c.-190 and c.-125 ([id number]).   HRD testing pending.    03/02/2021 Imaging   Outside imaging at [facility] improved peritoneal carcinomatosis. No evidence of new metastatic disease or acute abdominal pathology.    03/02/2021 Imaging   Outside CT imaging done at [facility] improved peritoneal carcinomatosis. No evidence of new metastatic disease or acute abdominal pathology   03/02/2021 - 03/03/2021 Hospital Admission   She was hospitalized briefly for evaluation of hematochezia at [facility].  She underwent CT imaging and colonoscopy and was subsequently discharged   03/03/2021 Procedure   Colonoscopy was performed at [facility]  Findings: Diverticula were found in the entire colon. Internal hemorrhoids were found during retroflexion.  The hemorrhoids were Grade I (internal hemorrhoids that do not prolapse). Impression: - Diverticulosis in the entire examined colon. Likely source of bleeding. - Internal hemorrhoids.   04/07/2021 Surgery   PROCEDURES:  Exam under anesthesia Diagnostic laparoscopy Exploratory laparotomy Lysis of adhesions (>30 minutes) Infragastric omentectomy Total abdominal hysterectomy and bilateral salpingo-oophorectomy Optimal (R0) interval tumor debulking  SURGEON: Surgeon(s) and Role: Leida Lauth, MD - Primary * Salinaro, Ivar Drape, MD - Resident - Assisting * Gloris Manchester, Kendrick Ranch, MD - Fellow  INDICATION(S): 72 y.o. who presented with carcinomatosis and mildly elevated CA125 to 95 with biopsy showing mullerian adenocarcinoma, s/p 3 cycles of neoadjuvant chemotherapy with normalization of CA125  and decreased disease burden on imaging.  OPERATIVE FINDINGS:  On exam, no palpable masses. On laparoscopy, omentum thickened and retracted along the transverse colon, pelvic adhesive disease. On laparotomy, omentum thickened, retracted, and densely adherent to the underlying colon mesentery, but without definitive viable tumor. Adhesions of the omentum to the descending colon, rectosigmoid colon to the left adnexa and posterior uterus and cervix, loop of ileum to the right adnexa, and bladder to anterior uterus and cervix. Transverse colon with small diverticulum that was over-sewed. Appendix normal and retrocecal. No grossly visible tumor in the abdomen including smooth diaphragm and liver surface, normal stomach, small bowel, colon, and normal appearing uterus, bilateral fallopian tubes, and bilateral ovaries.  At the conclusion of the case, no gross residual disease remained (R0).    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   05/22/2021 - 06/19/2021 Chemotherapy   Patient is on Treatment Plan : OVARIAN Carboplatin (AUC  6) / Paclitaxel (175) q21d x 6 cycles      06/19/2021 Tumor Marker   Patient's tumor was tested for the following markers: CA-125. Results of the tumor marker test revealed 3.   07/22/2021 Tumor Marker   Patient's tumor was tested for the following markers: CA-125. Results of the tumor marker test revealed 3.1.   07/23/2021 Imaging   1. No evidence metastatic ovarian carcinoma. 2. No peritoneal nodularity or fluid. 3. Post hysterectomy and oophorectomy       08/19/2021 Procedure   Successful removal of RIGHT chest implanted Port-A-Cath, as above   09/24/2021 Tumor Marker   Patient's tumor was tested for the following markers:  CA-125. Results of the tumor marker test revealed 3.7.   01/20/2022 Imaging   1. Stable. No evidence for metastatic disease in the abdomen or pelvis. 2. Tiny gas bubble identified in the bladder lumen. Correlation witfor h recent instrumentation recommended. In the absence of recent instrumentation, bladder infection would be a consideration. 3. Tiny hiatal hernia. 4. Left colonic diverticulosis without diverticulitis. 5. Aortic Atherosclerosis (ICD10-I70.0).   02/01/2022 Tumor Marker   Patient's tumor was tested for the following markers: CA-125. Results of the tumor marker test revealed 3.8.   05/06/2022 Tumor Marker   Patient's tumor was tested for the following markers: CA-125. Results of the tumor marker test revealed 3.4.   05/06/2022 Imaging   1. Stable examination without evidence of local recurrence or metastatic disease within the chest, abdomen, or pelvis. 2. Left-sided colonic diverticulosis without findings of acute diverticulitis. 3. Mosaic attenuation of the lung bases with mild diffuse bronchial wall thickening, findings which can be seen in the setting of small airways disease. 4.  Aortic Atherosclerosis (ICD10-I70.0).   08/09/2022 Tumor Marker   Patient's tumor was tested for the following markers: CA-125. Results of the tumor marker test revealed 2.8.   10/28/2022 Tumor Marker   Patient's tumor was tested for the following markers: CA-125. Results of the tumor marker test revealed 2.9.   11/12/2022 Imaging   CT ABDOMEN PELVIS W CONTRAST  Result Date: 11/12/2022 CLINICAL DATA:  Ovarian cancer restaging * Tracking Code: BO * EXAM: CT ABDOMEN AND PELVIS WITH CONTRAST TECHNIQUE: Multidetector CT imaging of the abdomen and pelvis was performed using the standard protocol following bolus administration of intravenous contrast. RADIATION DOSE REDUCTION: This exam was performed according to the departmental dose-optimization program which includes automated exposure control,  adjustment of the mA and/or kV according to patient size and/or use of iterative reconstruction technique. CONTRAST:  OMNIPAQUE IOHEXOL 300 MG/ML  SOLN COMPARISON:  CT scan 06/07/2022 FINDINGS: Lower chest: Mild scarring in the right middle lobe medially. Mild cardiomegaly. Hepatobiliary: Unremarkable Pancreas: Unremarkable Spleen: Unremarkable Adrenals/Urinary Tract: Unremarkable Stomach/Bowel: Prominent stool throughout the colon favors constipation. Vascular/Lymphatic: Mild systemic atherosclerosis including the abdominal aorta. No pathologic adenopathy. Reproductive: Uterus absent. No nodularity along the vaginal cuff. Adnexa unremarkable. Other: No significant nodularity along the omentum or peritoneal surfaces. No ascites. Musculoskeletal: Lumbar spondylosis and degenerative disc disease resulting in multilevel impingement. Rectus diastasis with mild ventral hernia containing adipose tissue and bowel as on image 56 series 2. No strangulation or complicating feature. IMPRESSION: 1. No findings of active malignancy. 2. Prominent stool throughout the colon favors constipation. 3. Mild cardiomegaly. 4. Lumbar spondylosis and degenerative disc disease resulting in multilevel impingement. 5. Rectus diastasis with mild ventral hernia containing adipose tissue and bowel. No strangulation or complicating feature. 6. Mild systemic atherosclerosis. Aortic Atherosclerosis (ICD10-I70.0). Electronically Signed  By: Gaylyn Rong M.D.   On: 11/12/2022 14:34      02/15/2023 Tumor Marker   Patient's tumor was tested for the following markers: CA-125. Results of the tumor marker test revealed 2.5.     PHYSICAL EXAMINATION: ECOG PERFORMANCE STATUS: 1 - Symptomatic but completely ambulatory  Vitals:   02/18/23 0916  BP: (!) 133/58  Pulse: 60  Resp: 18  Temp: 97.7 F (36.5 C)  SpO2: 99%   Filed Weights   02/18/23 0916  Weight: 235 lb 3.2 oz (106.7 kg)    GENERAL:alert, no distress and  comfortable NEURO: alert & oriented x 3 with fluent speech, no focal motor/sensory deficits  LABORATORY DATA:  I have reviewed the data as listed    Component Value Date/Time   NA 143 02/14/2023 0724   NA 143 12/20/2019 0829   K 4.5 02/14/2023 0724   CL 105 02/14/2023 0724   CO2 31 02/14/2023 0724   GLUCOSE 109 (H) 02/14/2023 0724   BUN 12 02/14/2023 0724   BUN 16 12/20/2019 0829   CREATININE 0.68 02/14/2023 0724   CREATININE 0.62 10/26/2022 0815   CALCIUM 9.6 02/14/2023 0724   PROT 6.8 02/14/2023 0724   PROT 6.4 02/04/2020 1036   ALBUMIN 4.2 02/14/2023 0724   ALBUMIN 4.2 02/04/2020 1036   AST 16 02/14/2023 0724   AST 17 10/26/2022 0815   ALT 12 02/14/2023 0724   ALT 17 10/26/2022 0815   ALKPHOS 72 02/14/2023 0724   BILITOT 0.7 02/14/2023 0724   BILITOT 0.9 10/26/2022 0815   GFRNONAA >60 02/14/2023 0724   GFRNONAA >60 10/26/2022 0815   GFRAA 104 12/20/2019 0829    No results found for: "SPEP", "UPEP"  Lab Results  Component Value Date   WBC 3.6 (L) 02/14/2023   NEUTROABS 2.1 02/14/2023   HGB 12.6 02/14/2023   HCT 38.2 02/14/2023   MCV 92.9 02/14/2023   PLT 207 02/14/2023      Chemistry      Component Value Date/Time   NA 143 02/14/2023 0724   NA 143 12/20/2019 0829   K 4.5 02/14/2023 0724   CL 105 02/14/2023 0724   CO2 31 02/14/2023 0724   BUN 12 02/14/2023 0724   BUN 16 12/20/2019 0829   CREATININE 0.68 02/14/2023 0724   CREATININE 0.62 10/26/2022 0815      Component Value Date/Time   CALCIUM 9.6 02/14/2023 0724   ALKPHOS 72 02/14/2023 0724   AST 16 02/14/2023 0724   AST 17 10/26/2022 0815   ALT 12 02/14/2023 0724   ALT 17 10/26/2022 0815   BILITOT 0.7 02/14/2023 0724   BILITOT 0.9 10/26/2022 0815

## 2023-02-18 NOTE — Assessment & Plan Note (Signed)
The patient felt that she had might have cancer recurrence She felt bloated and have intermittent abdominal discomfort She continues to struggle with intermittent chronic constipation Her tumor marker is normal However, just to be sure, I will order CT imaging to assess

## 2023-02-18 NOTE — Assessment & Plan Note (Signed)
Last B12 level was adequate.  This could be related to her prior treatment She is not symptomatic Observe only

## 2023-02-18 NOTE — Assessment & Plan Note (Signed)
Due to her intermittent chronic symptoms, it could be related to either constipation or cancer recurrence I will order CT imaging to assess

## 2023-02-18 NOTE — Assessment & Plan Note (Signed)
She continues to have difficulties with regular bowel movement I recommend the patient to reduce her fiber intake and to continue on laxative therapy

## 2023-02-22 ENCOUNTER — Ambulatory Visit (HOSPITAL_BASED_OUTPATIENT_CLINIC_OR_DEPARTMENT_OTHER)
Admission: RE | Admit: 2023-02-22 | Discharge: 2023-02-22 | Disposition: A | Discharge status: Home / Self Care | Payer: Medicare Other | Source: Ambulatory Visit | Attending: Hematology and Oncology | Admitting: Hematology and Oncology

## 2023-02-22 DIAGNOSIS — C5701 Malignant neoplasm of right fallopian tube: Secondary | ICD-10-CM | POA: Insufficient documentation

## 2023-02-22 MED ORDER — IOHEXOL 300 MG/ML  SOLN
100.0000 mL | Freq: Once | INTRAMUSCULAR | Status: AC | PRN
  Administered 2023-02-22: 100 mL via INTRAVENOUS

## 2023-02-25 ENCOUNTER — Other Ambulatory Visit (HOSPITAL_BASED_OUTPATIENT_CLINIC_OR_DEPARTMENT_OTHER): Payer: Medicare Other

## 2023-03-04 ENCOUNTER — Encounter: Payer: Self-pay | Admitting: Hematology and Oncology

## 2023-03-04 ENCOUNTER — Inpatient Hospital Stay: Payer: Medicare Other | Attending: Hematology and Oncology | Admitting: Hematology and Oncology

## 2023-03-04 VITALS — BP 143/63 | HR 63 | Temp 98.9°F | Resp 18 | Ht 65.0 in | Wt 241.8 lb

## 2023-03-04 DIAGNOSIS — I700 Atherosclerosis of aorta: Secondary | ICD-10-CM | POA: Insufficient documentation

## 2023-03-04 DIAGNOSIS — K921 Melena: Secondary | ICD-10-CM | POA: Insufficient documentation

## 2023-03-04 DIAGNOSIS — K640 First degree hemorrhoids: Secondary | ICD-10-CM | POA: Diagnosis not present

## 2023-03-04 DIAGNOSIS — C5701 Malignant neoplasm of right fallopian tube: Secondary | ICD-10-CM

## 2023-03-04 DIAGNOSIS — Z79899 Other long term (current) drug therapy: Secondary | ICD-10-CM | POA: Diagnosis not present

## 2023-03-04 DIAGNOSIS — K449 Diaphragmatic hernia without obstruction or gangrene: Secondary | ICD-10-CM | POA: Diagnosis not present

## 2023-03-04 DIAGNOSIS — Z7984 Long term (current) use of oral hypoglycemic drugs: Secondary | ICD-10-CM | POA: Diagnosis not present

## 2023-03-04 DIAGNOSIS — K439 Ventral hernia without obstruction or gangrene: Secondary | ICD-10-CM | POA: Diagnosis not present

## 2023-03-04 DIAGNOSIS — K5909 Other constipation: Secondary | ICD-10-CM

## 2023-03-04 DIAGNOSIS — C786 Secondary malignant neoplasm of retroperitoneum and peritoneum: Secondary | ICD-10-CM | POA: Diagnosis not present

## 2023-03-04 DIAGNOSIS — K5730 Diverticulosis of large intestine without perforation or abscess without bleeding: Secondary | ICD-10-CM | POA: Diagnosis not present

## 2023-03-04 DIAGNOSIS — I517 Cardiomegaly: Secondary | ICD-10-CM | POA: Diagnosis not present

## 2023-03-04 NOTE — Assessment & Plan Note (Signed)
Her abdominal wall hernia is stable

## 2023-03-04 NOTE — Assessment & Plan Note (Signed)
I have reviewed imaging study with the patient extensively There is no signs of cancer recurrence With recent laxatives, she have less stool burden this time The patient is reassured I will see her back in 3 months for further follow-up

## 2023-03-04 NOTE — Assessment & Plan Note (Signed)
Since her last visit, she has reduced her fluid intake She also had recent bowel cleansing with 72-hour fasting and laxative with good results I recommend the patient to continue on reduced fiber intake and regular laxative therapy

## 2023-03-04 NOTE — Progress Notes (Signed)
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[facility]) I have reviewed imaging study with the patient extensively There is no signs of cancer recurrence With recent laxatives, she have less stool burden this time The patient is reassured I will see her back in 3 months for further follow-up  Hernia of abdominal wall Her abdominal wall hernia is stable  Other constipation Since her last visit, she has reduced her fluid intake She also had recent bowel cleansing with 72-hour fasting and laxative with good results I recommend the patient to continue on reduced fiber intake and regular laxative therapy  No orders of the defined types were placed in this encounter.   All questions were answered. The patient knows to call the clinic with any problems, questions or concerns. The total time spent in the appointment was 30 minutes encounter with patients including review of chart and various tests results, discussions about plan of care and coordination of care plan   Artis Delay, MD 03/04/2023 1:10 PM  INTERVAL HISTORY: Please see below for problem oriented charting. she returns for surveillance follow-up as well as return to review test results Since the last visit, she felt better She had recent 72-hour fast with laxative with good results She denies recent constipation No recent nausea We spent a lot of time reviewing imaging studies and discussed future plan of care  REVIEW OF SYSTEMS:   Constitutional: Denies fevers, chills or abnormal weight loss Eyes: Denies blurriness of vision Ears, nose, mouth, throat, and face: Denies mucositis or sore throat Respiratory: Denies cough, dyspnea or wheezes Cardiovascular: Denies  palpitation, chest discomfort or lower extremity swelling Gastrointestinal:  Denies nausea, heartburn or change in bowel habits Skin: Denies abnormal skin rashes Lymphatics: Denies new lymphadenopathy or easy bruising Neurological:Denies numbness, tingling or new weaknesses Behavioral/Psych: Mood is stable, no new changes  All other systems were reviewed with the patient and are negative.  I have reviewed the past medical history, past surgical history, social history and family history with the patient and they are unchanged from previous note.  ALLERGIES:  is allergic to macrobid [nitrofurantoin], ciprofloxacin, codeine, and azithromycin.  MEDICATIONS:  Current Outpatient Medications  Medication Sig Dispense Refill   acetaminophen (TYLENOL) 500 MG tablet Take 1,000 mg by mouth every 6 (six) hours as needed for mild pain, moderate pain, fever or headache.     albuterol (VENTOLIN HFA) 108 (90 Base) MCG/ACT inhaler Inhale 2 puffs into the lungs every 6 (six) hours as needed for wheezing or shortness of breath. 8 g 6   atorvastatin (LIPITOR) 40 MG tablet Take 1 tablet (40 mg total) by mouth daily. 90 tablet 3   Biotin 1000 MCG CHEW      cholecalciferol (VITAMIN D3) 25 MCG (1000 UNIT) tablet Take 1,000 Units by mouth daily.     co-enzyme Q-10 30 MG capsule Take by mouth.     estradiol (ESTRACE VAGINAL) 0.1 MG/GM vaginal cream Place 1 Applicatorful vaginally at bedtime. 42.5 g 12   hydrocortisone 2.5 % cream Apply topically.     loratadine (CLARITIN) 10 MG tablet Take 10 mg by mouth daily.     losartan (COZAAR) 50 MG tablet Take 50 mg by mouth daily.     Melatonin 5 MG CHEW Chew 20 mg by  mouth at bedtime.     metFORMIN (GLUCOPHAGE) 500 MG tablet Take 500 mg by mouth 2 (two) times daily.     Omega 3 1000 MG CAPS Take 1 capsule by mouth daily.     pantoprazole (PROTONIX) 40 MG tablet Take 1 tablet (40 mg total) by mouth daily. 30 tablet 1   polyethylene glycol (MIRALAX / GLYCOLAX) 17 g packet  Take 8.5 g by mouth 2 (two) times daily.     Probiotic Product (PROBIOTIC BLEND PO) Take by mouth.     sennosides-docusate sodium (SENOKOT-S) 8.6-50 MG tablet Take 1 tablet by mouth 2 (two) times daily as needed for constipation.     UNABLE TO FIND Take 1 capsule by mouth daily. Med Name: [place] Tail Mushroom     No current facility-administered medications for this visit.    SUMMARY OF ONCOLOGIC HISTORY: Oncology History Overview Note  BRCA1/2 negative testing High grade serous, near complete pathological response to neoadjuvant chemo   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HCC) (Resolved)  12/30/2020 Initial Diagnosis   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HCC)   01/12/2021 - 03/11/2021 Chemotherapy   Patient is on Treatment Plan : OVARIAN Carboplatin (AUC 6) / Paclitaxel (175) q21d x 6 cycles     Adenocarcinoma of right fallopian tube (HCC)  07/18/2018 Procedure   Colonoscopy - Non-bleeding internal hemorrhoids. - Diverticulosis in the sigmoid colon and in the descending colon. - No specimens collected. - Blood in stool without cause found on endoscopic exam   05/08/2020 Imaging   1. Bladder is decompressed though demonstrates significant perivesicular hazy stranding, mucosal hyperemia and edematous mural thickening. Findings are suggestive of cystitis. Correlate with urinalysis. No abnormal perinephric or periureteral stranding or other features to suggest an ascending tract infection at this time. 2. Colonic diverticulosis without evidence of acute diverticulitis. 3. Aortic Atherosclerosis (ICD10-I70.0).   12/28/2020 Imaging   1. 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 Differential for peritoneal carcinomatosis is primarily neoplastic and includes cancers of the ovary, appendix, colon, pancreas and stomach. PET-CT may be helpful for further  characterization. Tissue sampling of the mesentery may be eventually required for diagnosis. 2. Mildly distended small bowel loops throughout the abdomen and pelvis, with fluid and associated air-fluid levels throughout the nondistended small bowel, suggesting a mild ileus versus partial small bowel obstruction. Favor partial small bowel obstruction with transition zone in the RIGHT lower quadrant, likely related to aforementioned neoplastic peritoneal implants and/or adhesions. 3. Small free fluid within the RIGHT upper quadrant and LEFT upper quadrant. No abscess collection seen. No free intraperitoneal air seen. 4. Extensive colonic diverticulosis without evidence of acute diverticulitis. 5. Small chronic pleural effusions with associated atelectasis.   12/29/2020 Tumor Marker   Patient's tumor was tested for the following markers: CA-125. Results of the tumor marker test revealed 95.1.   12/30/2020 Surgery   Preoperative diagnosis: abdominal nodules   Postoperative diagnosis: same   Procedure: diagnostic laparoscopy with abdominal wall biopsy   Surgeon: Feliciana Rossetti, M.D.    Indications for procedure: Tayloranne Zimbelman is a 72 y.o. year old female with symptoms of nausea, vomiting, and abdominal pain. Work up was concerning for cancer with abdominal wall studding.   Description of procedure: The patient was brought into the operative suite. Anesthesia was administered with General endotracheal anesthesia. WHO checklist was applied. The patient was then placed in supine position. The area was prepped and draped in the  usual sterile fashion.   A small left subcostal incision was made. A 5mm trocar was used to gain access to the peritoneal cavity by optical entry technique. Pneumoperitoneum was applied with a high flow and low pressure. The laparoscope was reinserted to confirm position.   On initial visualization of the abdomen, there were multiple nodules throughout the abdominal wall.  There were multiple nodules over the small intestine and mesentery. There was some loops of small intestine that were matted together. There was a large white mass in the left upper quadrant. Multiple areas of peritoneum were removed with harmonic scalpel. The large mass was inspected. It appeared to be related to the colon and omentum. Grasper was used to remove some superficial tissue and was sent for culture.   The abdomen was desufflated. The incisions were closed with 4-0 monocryl subcuticular stitch.   12/30/2020 Pathology Results   FINAL MICROSCOPIC DIAGNOSIS:   A. ABDOMINAL WALL, NODULE, EXCISION:  -  Metastatic adenocarcinoma  -  See comment   B. PERI COLONIC EXUDATE, EXCISION:  -  Metastatic adenocarcinoma  -  See comment   COMMENT:   By immunohistochemistry, the neoplastic cells are positive for cytokeratin 7, PAX8 and WT1 but negative for cytokeratin 20, CDX2, p53 and TTF-1.  The morphology and immunophenotype are consistent with a gynecologic primary.     01/01/2021 Initial Diagnosis   Peritoneal carcinomatosis (HCC)   01/08/2021 Cancer Staging   Staging form: Ovary, Fallopian Tube, and Primary Peritoneal Carcinoma, AJCC 8th Edition - Clinical stage from 01/08/2021: cT3, cN0, cM0 - Signed by Artis Delay, MD on 01/08/2021 Stage prefix: Initial diagnosis   01/12/2021 - 03/11/2021 Chemotherapy   Patient is on Treatment Plan : OVARIAN Carboplatin (AUC 6) / Paclitaxel (175) q21d x 6 cycles     01/17/2021 Imaging   MRI brain No evidence of intracranial metastatic disease or other acute abnormality   02/12/2021 Genetic Testing   No pathogenic variants detected in [facility].  The report date is February 12, 2021.   The [facility] gene panel offered by [employer] includes sequencing and deletion/duplication testing of the following 48 genes: APC, ATM, AXIN2, BAP1, BARD1, BMPR1A, BRCA1, BRCA2, BRIP1, CHD1, CDK4, CDKN2A(p16 and p14ARF), CHEK2, CTNNA1, EGFR,  EPCAM, FH, FLCN, GREM1, HOXB13, MEN1, MET, MITF , MLH1, MSH2, MSH3, MSH6, MUTYH, NTHL1, PALB2, PMS2, POLD1, POLE, PTEN, RAD51C, RAD51D, RET, SDHA, SDHB, SDHC, SDHD, SMAD4, STK11,TERT, TP53, TSC1, TSC2, and VHL.  Unless otherwise specified, all coding regions and flanking non-coding regions are analyzed for sequence variation. Analysis of flanking intronic regions typically do not extend more than 20 bp before and 10 bp after each exon, though the exact region may be adjusted based on the presence of either potentially significant variants or highly repetitive sequences. Coding regions and proximal promoter regions near the transcription start sites are analyzed for large deletions or duplications. Specific genes are tested only for sequence and/or CNVs within limited regions. Limited clinically relevant regions are included for EGFR (sequencing and CNV analysis of exons 18-21),RET (sequencing and CNV analysis of exons 5, 8, 10, 11, and 13-16), and MITF (sequencing of position c.952). Only CNV analysis of the last two exons of EPCAM is performed. CNV analysis of GREM1 includes the upstream region overlapping the adjacent gene SCG5. MSH3 exon 1 contains a long polyalanine repeat that can interfere with variant calling; therefore, MSH3 analysis excludes c.121 to c.237. Only sequence analysis of the exons encompassing the exonuclease domains of these genes is  performed (POLD1 c.841 to c.1686, POLE c.802 to c.1473). Limited promoter regions in selected genes undergo sequence analysis including TERT (c.-71 to c.-1), and APC Promoter 1B (c.-195 to c.-190 and c.-125 ([id number]).   HRD testing pending.    03/02/2021 Imaging   Outside imaging at [facility] improved peritoneal carcinomatosis. No evidence of new metastatic disease or acute abdominal pathology.    03/02/2021 Imaging   Outside CT imaging done at [facility] improved peritoneal carcinomatosis. No evidence of new metastatic disease or acute  abdominal pathology   03/02/2021 - 03/03/2021 Hospital Admission   She was hospitalized briefly for evaluation of hematochezia at [facility].  She underwent CT imaging and colonoscopy and was subsequently discharged   03/03/2021 Procedure   Colonoscopy was performed at [facility]  Findings: Diverticula were found in the entire colon. Internal hemorrhoids were found during retroflexion.  The hemorrhoids were Grade I (internal hemorrhoids that do not prolapse). Impression: - Diverticulosis in the entire examined colon. Likely source of bleeding. - Internal hemorrhoids.   04/07/2021 Surgery   PROCEDURES:  Exam under anesthesia Diagnostic laparoscopy Exploratory laparotomy Lysis of adhesions (>30 minutes) Infragastric omentectomy Total abdominal hysterectomy and bilateral salpingo-oophorectomy Optimal (R0) interval tumor debulking  SURGEON: Surgeon(s) and Role: Leida Lauth, MD - Primary * Salinaro, Ivar Drape, MD - Resident - Assisting * Gloris Manchester, Kendrick Ranch, MD - Fellow  INDICATION(S): 72 y.o. who presented with carcinomatosis and mildly elevated CA125 to 95 with biopsy showing mullerian adenocarcinoma, s/p 3 cycles of neoadjuvant chemotherapy with normalization of CA125 and decreased disease burden on imaging.  OPERATIVE FINDINGS:  On exam, no palpable masses. On laparoscopy, omentum thickened and retracted along the transverse colon, pelvic adhesive disease. On laparotomy, omentum thickened, retracted, and densely adherent to the underlying colon mesentery, but without definitive viable tumor. Adhesions of the omentum to the descending colon, rectosigmoid colon to the left adnexa and posterior uterus and cervix, loop of ileum to the right adnexa, and bladder to anterior uterus and cervix. Transverse colon with small diverticulum that was over-sewed. Appendix normal and retrocecal. No grossly visible tumor in the abdomen including smooth diaphragm and liver surface, normal stomach, small  bowel, colon, and normal appearing uterus, bilateral fallopian tubes, and bilateral ovaries.  At the conclusion of the case, no gross residual disease remained (R0).    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   05/22/2021 - 06/19/2021 Chemotherapy   Patient is on Treatment Plan : OVARIAN Carboplatin (AUC 6) / Paclitaxel (175) q21d x 6 cycles      06/19/2021 Tumor Marker   Patient's tumor was tested for the following markers: CA-125. Results of the tumor marker test revealed 3.   07/22/2021 Tumor Marker   Patient's tumor was tested for the following markers: CA-125. Results of the tumor marker test revealed 3.1.   07/23/2021 Imaging   1. No evidence metastatic ovarian carcinoma. 2. No peritoneal nodularity or fluid. 3. Post hysterectomy and oophorectomy       08/19/2021 Procedure   Successful removal of RIGHT chest implanted Port-A-Cath, as above   09/24/2021 Tumor Marker   Patient's tumor was tested for the following markers: CA-125. Results of the tumor marker test revealed 3.7.   01/20/2022 Imaging   1. Stable. No evidence for metastatic disease in the abdomen or  pelvis. 2. Tiny gas bubble identified in the bladder lumen. Correlation witfor h recent instrumentation recommended. In the absence of recent instrumentation, bladder infection would be a consideration. 3. Tiny hiatal hernia. 4. Left colonic diverticulosis without diverticulitis. 5. Aortic Atherosclerosis (ICD10-I70.0).   02/01/2022 Tumor Marker   Patient's tumor was tested for the following markers: CA-125. Results of the tumor marker test revealed 3.8.   05/06/2022 Tumor Marker   Patient's tumor was tested for the following markers: CA-125. Results of the tumor marker test  revealed 3.4.   05/06/2022 Imaging   1. Stable examination without evidence of local recurrence or metastatic disease within the chest, abdomen, or pelvis. 2. Left-sided colonic diverticulosis without findings of acute diverticulitis. 3. Mosaic attenuation of the lung bases with mild diffuse bronchial wall thickening, findings which can be seen in the setting of small airways disease. 4.  Aortic Atherosclerosis (ICD10-I70.0).   08/09/2022 Tumor Marker   Patient's tumor was tested for the following markers: CA-125. Results of the tumor marker test revealed 2.8.   10/28/2022 Tumor Marker   Patient's tumor was tested for the following markers: CA-125. Results of the tumor marker test revealed 2.9.   11/12/2022 Imaging   CT ABDOMEN PELVIS W CONTRAST  Result Date: 11/12/2022 CLINICAL DATA:  Ovarian cancer restaging * Tracking Code: BO * EXAM: CT ABDOMEN AND PELVIS WITH CONTRAST TECHNIQUE: Multidetector CT imaging of the abdomen and pelvis was performed using the standard protocol following bolus administration of intravenous contrast. RADIATION DOSE REDUCTION: This exam was performed according to the departmental dose-optimization program which includes automated exposure control, adjustment of the mA and/or kV according to patient size and/or use of iterative reconstruction technique. CONTRAST:  OMNIPAQUE IOHEXOL 300 MG/ML  SOLN COMPARISON:  CT scan 06/07/2022 FINDINGS: Lower chest: Mild scarring in the right middle lobe medially. Mild cardiomegaly. Hepatobiliary: Unremarkable Pancreas: Unremarkable Spleen: Unremarkable Adrenals/Urinary Tract: Unremarkable Stomach/Bowel: Prominent stool throughout the colon favors constipation. Vascular/Lymphatic: Mild systemic atherosclerosis including the abdominal aorta. No pathologic adenopathy. Reproductive: Uterus absent. No nodularity along the vaginal cuff. Adnexa unremarkable. Other: No significant nodularity along the omentum or peritoneal surfaces. No  ascites. Musculoskeletal: Lumbar spondylosis and degenerative disc disease resulting in multilevel impingement. Rectus diastasis with mild ventral hernia containing adipose tissue and bowel as on image 56 series 2. No strangulation or complicating feature. IMPRESSION: 1. No findings of active malignancy. 2. Prominent stool throughout the colon favors constipation. 3. Mild cardiomegaly. 4. Lumbar spondylosis and degenerative disc disease resulting in multilevel impingement. 5. Rectus diastasis with mild ventral hernia containing adipose tissue and bowel. No strangulation or complicating feature. 6. Mild systemic atherosclerosis. Aortic Atherosclerosis (ICD10-I70.0). Electronically Signed   By: Gaylyn Rong M.D.   On: 11/12/2022 14:34      02/15/2023 Tumor Marker   Patient's tumor was tested for the following markers: CA-125. Results of the tumor marker test revealed 2.5.   02/22/2023 Imaging   CT ABDOMEN PELVIS W CONTRAST  Result Date: 03/03/2023 CLINICAL DATA:  Follow-up right fallopian tube cancer EXAM: CT ABDOMEN AND PELVIS WITH CONTRAST TECHNIQUE: Multidetector CT imaging of the abdomen and pelvis was performed using the standard protocol following bolus administration of intravenous contrast. RADIATION DOSE REDUCTION: This exam was performed according to the departmental dose-optimization program which includes automated exposure control, adjustment of the mA and/or kV according to patient size and/or use of iterative reconstruction technique. CONTRAST:  OMNIPAQUE IOHEXOL 300 MG/ML  SOLN COMPARISON:  11/09/2022 FINDINGS: Lower chest: Lung bases are clear. Hepatobiliary:  Liver is within normal limits. Gallbladder is unremarkable. No intrahepatic or extrahepatic ductal dilatation. Pancreas: Within normal limits. Spleen: Within normal limits. Adrenals/Urinary Tract: Adrenal glands within normal limits. Kidneys are within normal limits. No hydronephrosis. Bladder is underdistended but  unremarkable. Stomach/Bowel: Stomach is notable for a small hiatal hernia. No evidence of bowel obstruction. Normal appendix (series 301/image 45). Left colonic diverticulosis, without diverticulitis. Vascular/Lymphatic: No evidence of abdominal aortic aneurysm. Atherosclerotic calcifications of the abdominal aorta and branch vessels, although they remain patent. No suspicious abdominopelvic lymphadenopathy. Reproductive: Status post hysterectomy and bilateral salpingo oophorectomy. Other: No abdominopelvic ascites. No frank peritoneal nodularity/omental caking. Musculoskeletal: Mild degenerative changes of the visualized thoracolumbar spine. IMPRESSION: Status post hysterectomy and bilateral salpingo oophorectomy. No evidence of recurrent or metastatic disease in the abdomen/pelvis. Electronically Signed   By: Charline Bills M.D.   On: 03/03/2023 00:07        PHYSICAL EXAMINATION: ECOG PERFORMANCE STATUS: 1 - Symptomatic but completely ambulatory  Vitals:   03/04/23 1259  BP: (!) 143/63  Pulse: 63  Resp: 18  Temp: 98.9 F (37.2 C)  SpO2: 99%   Filed Weights   03/04/23 1259  Weight: 241 lb 12.8 oz (109.7 kg)    GENERAL:alert, no distress and comfortable   LABORATORY DATA:  I have reviewed the data as listed    Component Value Date/Time   NA 143 02/14/2023 0724   NA 143 12/20/2019 0829   K 4.5 02/14/2023 0724   CL 105 02/14/2023 0724   CO2 31 02/14/2023 0724   GLUCOSE 109 (H) 02/14/2023 0724   BUN 12 02/14/2023 0724   BUN 16 12/20/2019 0829   CREATININE 0.68 02/14/2023 0724   CREATININE 0.62 10/26/2022 0815   CALCIUM 9.6 02/14/2023 0724   PROT 6.8 02/14/2023 0724   PROT 6.4 02/04/2020 1036   ALBUMIN 4.2 02/14/2023 0724   ALBUMIN 4.2 02/04/2020 1036   AST 16 02/14/2023 0724   AST 17 10/26/2022 0815   ALT 12 02/14/2023 0724   ALT 17 10/26/2022 0815   ALKPHOS 72 02/14/2023 0724   BILITOT 0.7 02/14/2023 0724   BILITOT 0.9 10/26/2022 0815   GFRNONAA >60 02/14/2023 0724    GFRNONAA >60 10/26/2022 0815   GFRAA 104 12/20/2019 0829    No results found for: "SPEP", "UPEP"  Lab Results  Component Value Date   WBC 3.6 (L) 02/14/2023   NEUTROABS 2.1 02/14/2023   HGB 12.6 02/14/2023   HCT 38.2 02/14/2023   MCV 92.9 02/14/2023   PLT 207 02/14/2023      Chemistry      Component Value Date/Time   NA 143 02/14/2023 0724   NA 143 12/20/2019 0829   K 4.5 02/14/2023 0724   CL 105 02/14/2023 0724   CO2 31 02/14/2023 0724   BUN 12 02/14/2023 0724   BUN 16 12/20/2019 0829   CREATININE 0.68 02/14/2023 0724   CREATININE 0.62 10/26/2022 0815      Component Value Date/Time   CALCIUM 9.6 02/14/2023 0724   ALKPHOS 72 02/14/2023 0724   AST 16 02/14/2023 0724   AST 17 10/26/2022 0815   ALT 12 02/14/2023 0724   ALT 17 10/26/2022 0815   BILITOT 0.7 02/14/2023 0724   BILITOT 0.9 10/26/2022 0815       RADIOGRAPHIC STUDIES: I have reviewed multiple imaging studies with the patient extensively I have personally reviewed the radiological images as listed and agreed with the findings in the report. CT ABDOMEN PELVIS W CONTRAST  Result Date: 03/03/2023  CLINICAL DATA:  Follow-up right fallopian tube cancer EXAM: CT ABDOMEN AND PELVIS WITH CONTRAST TECHNIQUE: Multidetector CT imaging of the abdomen and pelvis was performed using the standard protocol following bolus administration of intravenous contrast. RADIATION DOSE REDUCTION: This exam was performed according to the departmental dose-optimization program which includes automated exposure control, adjustment of the mA and/or kV according to patient size and/or use of iterative reconstruction technique. CONTRAST:  OMNIPAQUE IOHEXOL 300 MG/ML  SOLN COMPARISON:  11/09/2022 FINDINGS: Lower chest: Lung bases are clear. Hepatobiliary: Liver is within normal limits. Gallbladder is unremarkable. No intrahepatic or extrahepatic ductal dilatation. Pancreas: Within normal limits. Spleen: Within normal limits.  Adrenals/Urinary Tract: Adrenal glands within normal limits. Kidneys are within normal limits. No hydronephrosis. Bladder is underdistended but unremarkable. Stomach/Bowel: Stomach is notable for a small hiatal hernia. No evidence of bowel obstruction. Normal appendix (series 301/image 45). Left colonic diverticulosis, without diverticulitis. Vascular/Lymphatic: No evidence of abdominal aortic aneurysm. Atherosclerotic calcifications of the abdominal aorta and branch vessels, although they remain patent. No suspicious abdominopelvic lymphadenopathy. Reproductive: Status post hysterectomy and bilateral salpingo oophorectomy. Other: No abdominopelvic ascites. No frank peritoneal nodularity/omental caking. Musculoskeletal: Mild degenerative changes of the visualized thoracolumbar spine. IMPRESSION: Status post hysterectomy and bilateral salpingo oophorectomy. No evidence of recurrent or metastatic disease in the abdomen/pelvis. Electronically Signed   By: Charline Bills M.D.   On: 03/03/2023 00:07

## 2023-03-31 ENCOUNTER — Emergency Department (HOSPITAL_BASED_OUTPATIENT_CLINIC_OR_DEPARTMENT_OTHER): Payer: Medicare Other

## 2023-03-31 ENCOUNTER — Emergency Department (HOSPITAL_BASED_OUTPATIENT_CLINIC_OR_DEPARTMENT_OTHER)
Admission: EM | Admit: 2023-03-31 | Discharge: 2023-03-31 | Disposition: A | Discharge status: Home / Self Care | Payer: Medicare Other | Attending: Emergency Medicine | Admitting: Emergency Medicine

## 2023-03-31 ENCOUNTER — Other Ambulatory Visit: Payer: Self-pay

## 2023-03-31 ENCOUNTER — Encounter (HOSPITAL_BASED_OUTPATIENT_CLINIC_OR_DEPARTMENT_OTHER): Payer: Self-pay | Admitting: Emergency Medicine

## 2023-03-31 DIAGNOSIS — I10 Essential (primary) hypertension: Secondary | ICD-10-CM | POA: Diagnosis not present

## 2023-03-31 DIAGNOSIS — W010XXA Fall on same level from slipping, tripping and stumbling without subsequent striking against object, initial encounter: Secondary | ICD-10-CM | POA: Insufficient documentation

## 2023-03-31 DIAGNOSIS — Z7902 Long term (current) use of antithrombotics/antiplatelets: Secondary | ICD-10-CM | POA: Insufficient documentation

## 2023-03-31 DIAGNOSIS — Z79899 Other long term (current) drug therapy: Secondary | ICD-10-CM | POA: Insufficient documentation

## 2023-03-31 DIAGNOSIS — W19XXXA Unspecified fall, initial encounter: Secondary | ICD-10-CM

## 2023-03-31 DIAGNOSIS — S0990XA Unspecified injury of head, initial encounter: Secondary | ICD-10-CM | POA: Diagnosis present

## 2023-03-31 DIAGNOSIS — S0083XA Contusion of other part of head, initial encounter: Secondary | ICD-10-CM | POA: Diagnosis not present

## 2023-03-31 NOTE — ED Provider Notes (Signed)
[facility] Provider Note   CSN: [id number] Arrival date & time: 03/31/23  9562     History  Chief Complaint  Patient presents with   Marletta Lor    Brittany Middleton is a 72 y.o. female.  Patient is a 72 year old female with a history of hypertension, ascending aortic aneurysm and coronary aneurysm currently on Plavix and aspirin, frequent UTIs, chronic diplopia, peritoneal carcinomatosis and prior GI bleeding who is presenting today after a fall.  Patient reports she was walking her dog and tripped over uneven sidewalk falling forward hitting the right side of her face on the sidewalk.  She was able to stand and walk after this event.  She denies any LOC.  Since the event she has had pain and swelling of the right side of her face.  She has no significant vision changes but does feel pressure around her eye.  No difficulty biting down.  She also is having some neck discomfort.  The history is provided by the patient.  Fall       Home Medications Prior to Admission medications   Medication Sig Start Date End Date Taking? Authorizing Provider  acetaminophen (TYLENOL) 500 MG tablet Take 1,000 mg by mouth every 6 (six) hours as needed for mild pain, moderate pain, fever or headache.    [provider]  albuterol (VENTOLIN HFA) 108 (90 Base) MCG/ACT inhaler Inhale 2 puffs into the lungs every 6 (six) hours as needed for wheezing or shortness of breath. 11/04/21   Martina Sinner, MD  atorvastatin (LIPITOR) 40 MG tablet Take 1 tablet (40 mg total) by mouth daily. 07/05/22   Swinyer, Zachary George, NP  Biotin 1000 MCG CHEW     [provider]  cholecalciferol (VITAMIN D3) 25 MCG (1000 UNIT) tablet Take 1,000 Units by mouth daily.    [provider]  co-enzyme Q-10 30 MG capsule Take by mouth. 09/17/21   [provider]  estradiol (ESTRACE VAGINAL) 0.1 MG/GM vaginal cream Place 1 Applicatorful vaginally at  bedtime. 01/28/22   Artis Delay, MD  hydrocortisone 2.5 % cream Apply topically. 10/14/21   [provider]  loratadine (CLARITIN) 10 MG tablet Take 10 mg by mouth daily.    [provider]  losartan (COZAAR) 50 MG tablet Take 50 mg by mouth daily. 10/02/21   [provider]  Melatonin 5 MG CHEW Chew 20 mg by mouth at bedtime.    [provider]  metFORMIN (GLUCOPHAGE) 500 MG tablet Take 500 mg by mouth 2 (two) times daily. 10/03/21   [provider]  Omega 3 1000 MG CAPS Take 1 capsule by mouth daily.    [provider]  pantoprazole (PROTONIX) 40 MG tablet Take 1 tablet (40 mg total) by mouth daily. 04/01/20   Regalado, Belkys A, MD  polyethylene glycol (MIRALAX / GLYCOLAX) 17 g packet Take 8.5 g by mouth 2 (two) times daily.    [provider]  Probiotic Product (PROBIOTIC BLEND PO) Take by mouth.    [provider]  sennosides-docusate sodium (SENOKOT-S) 8.6-50 MG tablet Take 1 tablet by mouth 2 (two) times daily as needed for constipation.    [provider]  UNABLE TO FIND Take 1 capsule by mouth daily. Med Name: [place] Geographical information systems officer, Historical, MD      Allergies    Macrobid [nitrofurantoin], Ciprofloxacin, Codeine, and Azithromycin    Review of Systems   Review of Systems  Physical Exam Updated Vital Signs BP (!) 152/63 (BP Location: Left Arm)   Pulse 66   Temp 98.6 F (37 C) (Oral)   Resp 20   Ht 5\' 5"  (1.651 m)   Wt 107 kg   SpO2 96%   BMI 39.27 kg/m  Physical Exam Vitals and nursing note reviewed.  Constitutional:      General: She is not in acute distress.    Appearance: Normal appearance. She is well-developed.  HENT:     Head: Normocephalic.      Nose: Nose normal.     Mouth/Throat:     Comments: Able to bite down with minimal tenderness Eyes:     Extraocular Movements: Extraocular movements intact.     Conjunctiva/sclera: Conjunctivae normal.     Pupils: Pupils are  equal, round, and reactive to light.  Neck:   Cardiovascular:     Rate and Rhythm: Normal rate and regular rhythm.     Heart sounds: Normal heart sounds. No murmur heard.    No friction rub.  Pulmonary:     Effort: Pulmonary effort is normal.     Breath sounds: Normal breath sounds. No wheezing or rales.  Abdominal:     General: Bowel sounds are normal. There is no distension.     Palpations: Abdomen is soft.     Tenderness: There is no abdominal tenderness. There is no guarding or rebound.  Musculoskeletal:        General: Tenderness present. Normal range of motion.     Left knee: Ecchymosis present. No swelling. Normal range of motion.       Legs:     Comments: No edema  Skin:    General: Skin is warm and dry.     Findings: No rash.  Neurological:     Mental Status: She is alert and oriented to person, place, and time. Mental status is at baseline.     Cranial Nerves: No cranial nerve deficit.  Psychiatric:        Behavior: Behavior normal.     ED Results / Procedures / Treatments   Labs (all labs ordered are listed, but only abnormal results are displayed) Labs Reviewed - No data to display  EKG None  Radiology CT Head Wo Contrast  Result Date: 03/31/2023 CLINICAL DATA:  Head trauma, moderate-severe; Neck trauma, midline tenderness (Age 60-64y) neck; Facial trauma, blunt EXAM: CT HEAD WITHOUT CONTRAST CT MAXILLOFACIAL WITHOUT CONTRAST CT CERVICAL SPINE WITHOUT CONTRAST TECHNIQUE: Multidetector CT imaging of the head, cervical spine, and maxillofacial structures were performed using the standard protocol without intravenous contrast. Multiplanar CT image reconstructions of the cervical spine and maxillofacial structures were also generated. RADIATION DOSE REDUCTION: This exam was performed according to the departmental dose-optimization program which includes automated exposure control, adjustment of the mA and/or kV according to patient size and/or use of iterative  reconstruction technique. COMPARISON:  None Available. FINDINGS: CT HEAD FINDINGS Brain: No hemorrhage. No hydrocephalus. No extra-axial fluid collection. No CT evidence of an acute cortical infarct. No mass effect. No mass lesion. Vascular: No hyperdense vessel or unexpected calcification. Skull: Normal. Negative for fracture or focal lesion. Other: None. CT MAXILLOFACIAL FINDINGS Osseous: No fracture or mandibular dislocation. No destructive process. Orbits: Negative. No traumatic or inflammatory finding. Sinuses: No middle ear or mastoid effusion. Paranasal sinuses are notable for mild mucosal thickening in the posterior ethmoid air cells on the right. Bilateral lens replacement. Orbits are otherwise unremarkable. Soft tissues: Periorbital soft tissue hematoma on the right.  CT CERVICAL SPINE FINDINGS Alignment: Grade 1 anterolisthesis of C3 on C4 and C4 on C5. Skull base and vertebrae: No acute fracture. No primary bone lesion or focal pathologic process. Soft tissues and spinal canal: No prevertebral fluid or swelling. No visible canal hematoma. There is a small locule of air in the dorsal epidural space at the C7 level (series 4, image 63). This most likely represents air within a synovial cyst. Disc levels:  No evidence of high-grade spinal canal stenosis. Upper chest: Negative. Other: There is asymmetric atrophy of the right submandibular gland. IMPRESSION: 1. No CT evidence of intracranial injury. 2. Periorbital soft tissue hematoma on the right. No acute facial bone fracture. 3. No acute fracture or traumatic listhesis of the cervical spine. 4. Small locule of air in the dorsal epidural space at the C7 level, which most likely represents air within a synovial cyst. Electronically Signed   By: Lorenza Cambridge M.D.   On: 03/31/2023 10:37   CT Cervical Spine Wo Contrast  Result Date: 03/31/2023 CLINICAL DATA:  Head trauma, moderate-severe; Neck trauma, midline tenderness (Age 84-64y) neck; Facial trauma,  blunt EXAM: CT HEAD WITHOUT CONTRAST CT MAXILLOFACIAL WITHOUT CONTRAST CT CERVICAL SPINE WITHOUT CONTRAST TECHNIQUE: Multidetector CT imaging of the head, cervical spine, and maxillofacial structures were performed using the standard protocol without intravenous contrast. Multiplanar CT image reconstructions of the cervical spine and maxillofacial structures were also generated. RADIATION DOSE REDUCTION: This exam was performed according to the departmental dose-optimization program which includes automated exposure control, adjustment of the mA and/or kV according to patient size and/or use of iterative reconstruction technique. COMPARISON:  None Available. FINDINGS: CT HEAD FINDINGS Brain: No hemorrhage. No hydrocephalus. No extra-axial fluid collection. No CT evidence of an acute cortical infarct. No mass effect. No mass lesion. Vascular: No hyperdense vessel or unexpected calcification. Skull: Normal. Negative for fracture or focal lesion. Other: None. CT MAXILLOFACIAL FINDINGS Osseous: No fracture or mandibular dislocation. No destructive process. Orbits: Negative. No traumatic or inflammatory finding. Sinuses: No middle ear or mastoid effusion. Paranasal sinuses are notable for mild mucosal thickening in the posterior ethmoid air cells on the right. Bilateral lens replacement. Orbits are otherwise unremarkable. Soft tissues: Periorbital soft tissue hematoma on the right. CT CERVICAL SPINE FINDINGS Alignment: Grade 1 anterolisthesis of C3 on C4 and C4 on C5. Skull base and vertebrae: No acute fracture. No primary bone lesion or focal pathologic process. Soft tissues and spinal canal: No prevertebral fluid or swelling. No visible canal hematoma. There is a small locule of air in the dorsal epidural space at the C7 level (series 4, image 63). This most likely represents air within a synovial cyst. Disc levels:  No evidence of high-grade spinal canal stenosis. Upper chest: Negative. Other: There is asymmetric  atrophy of the right submandibular gland. IMPRESSION: 1. No CT evidence of intracranial injury. 2. Periorbital soft tissue hematoma on the right. No acute facial bone fracture. 3. No acute fracture or traumatic listhesis of the cervical spine. 4. Small locule of air in the dorsal epidural space at the C7 level, which most likely represents air within a synovial cyst. Electronically Signed   By: Lorenza Cambridge M.D.   On: 03/31/2023 10:37   CT Maxillofacial Wo Contrast  Result Date: 03/31/2023 CLINICAL DATA:  Head trauma, moderate-severe; Neck trauma, midline tenderness (Age 84-64y) neck; Facial trauma, blunt EXAM: CT HEAD WITHOUT CONTRAST CT MAXILLOFACIAL WITHOUT CONTRAST CT CERVICAL SPINE WITHOUT CONTRAST TECHNIQUE: Multidetector CT imaging of the head, cervical spine,  and maxillofacial structures were performed using the standard protocol without intravenous contrast. Multiplanar CT image reconstructions of the cervical spine and maxillofacial structures were also generated. RADIATION DOSE REDUCTION: This exam was performed according to the departmental dose-optimization program which includes automated exposure control, adjustment of the mA and/or kV according to patient size and/or use of iterative reconstruction technique. COMPARISON:  None Available. FINDINGS: CT HEAD FINDINGS Brain: No hemorrhage. No hydrocephalus. No extra-axial fluid collection. No CT evidence of an acute cortical infarct. No mass effect. No mass lesion. Vascular: No hyperdense vessel or unexpected calcification. Skull: Normal. Negative for fracture or focal lesion. Other: None. CT MAXILLOFACIAL FINDINGS Osseous: No fracture or mandibular dislocation. No destructive process. Orbits: Negative. No traumatic or inflammatory finding. Sinuses: No middle ear or mastoid effusion. Paranasal sinuses are notable for mild mucosal thickening in the posterior ethmoid air cells on the right. Bilateral lens replacement. Orbits are otherwise  unremarkable. Soft tissues: Periorbital soft tissue hematoma on the right. CT CERVICAL SPINE FINDINGS Alignment: Grade 1 anterolisthesis of C3 on C4 and C4 on C5. Skull base and vertebrae: No acute fracture. No primary bone lesion or focal pathologic process. Soft tissues and spinal canal: No prevertebral fluid or swelling. No visible canal hematoma. There is a small locule of air in the dorsal epidural space at the C7 level (series 4, image 63). This most likely represents air within a synovial cyst. Disc levels:  No evidence of high-grade spinal canal stenosis. Upper chest: Negative. Other: There is asymmetric atrophy of the right submandibular gland. IMPRESSION: 1. No CT evidence of intracranial injury. 2. Periorbital soft tissue hematoma on the right. No acute facial bone fracture. 3. No acute fracture or traumatic listhesis of the cervical spine. 4. Small locule of air in the dorsal epidural space at the C7 level, which most likely represents air within a synovial cyst. Electronically Signed   By: Lorenza Cambridge M.D.   On: 03/31/2023 10:37    Procedures Procedures    Medications Ordered in ED Medications - No data to display  ED Course/ Medical Decision Making/ A&P                                 Medical Decision Making Amount and/or Complexity of Data Reviewed Radiology: ordered and independent interpretation performed. Decision-making details documented in ED Course.   Pt with multiple medical problems and comorbidities and presenting today with a complaint that caries a high risk for morbidity and mortality.  Here today after a fall when she tripped over uneven sidewalk.  She has significant trauma noted to the right side of her face.  At this time extraocular movements are intact and vision is intact.  No findings to suggest jaw fracture.  However patient is anticoagulated and is also having some neck tenderness.  Imaging pending.  Patient's neuroexam is normal.  She was able to ambulate.   Does have some ecchymosis on the left knee but low suspicion for underlying fracture.  10:58 AM I have independently visualized and interpreted pt's images today.  Head CT is negative for acute bleed, facial CT negative for fracture, cervical spine without spinal fracture.  Radiology does report there is periorbital soft tissue hematoma but no other acute findings.  She does have a small locule of air in the dorsal epidural space at C7 that they feel most likely represents a synovial cyst.  Patient was able to stand and walk  to the bathroom without assistance.  Appears stable for discharge home.  Findings discussed with the patient return precautions.  She is comfortable with this plan.          Final Clinical Impression(s) / ED Diagnoses Final diagnoses:  Fall, initial encounter  Facial hematoma, initial encounter    Rx / DC Orders ED Discharge Orders     None         Gwyneth Sprout, MD 03/31/23 1058

## 2023-03-31 NOTE — ED Triage Notes (Signed)
Pt fell this am while walking the dogs.  She tripped and hit her head.  Noted hematoma forming over right eye. Denies LOC.  Pt takes plavix and aspirin.  Pt is alert and oriented x 4.  Some visual issues.

## 2023-03-31 NOTE — Discharge Instructions (Addendum)
CAT scan shows no sign of internal bleeding today or broken bones.  You can use ice intermittently on the hematoma on your face.  If you start having repetitive vomiting, confusion, passing out or other concerns return to the emergency room.

## 2023-04-15 ENCOUNTER — Other Ambulatory Visit: Payer: Self-pay | Admitting: Internal Medicine

## 2023-04-15 DIAGNOSIS — Z1231 Encounter for screening mammogram for malignant neoplasm of breast: Secondary | ICD-10-CM

## 2023-05-13 ENCOUNTER — Ambulatory Visit
Admission: RE | Admit: 2023-05-13 | Discharge: 2023-05-13 | Disposition: A | Discharge status: Home / Self Care | Payer: Medicare Other | Source: Ambulatory Visit | Attending: Internal Medicine | Admitting: Internal Medicine

## 2023-05-13 DIAGNOSIS — Z1231 Encounter for screening mammogram for malignant neoplasm of breast: Secondary | ICD-10-CM

## 2023-05-24 ENCOUNTER — Telehealth: Payer: Self-pay

## 2023-05-24 NOTE — Telephone Encounter (Signed)
Called and moved 12/13 appt to 12/12. She is aware of appt date/time.

## 2023-05-27 ENCOUNTER — Inpatient Hospital Stay: Payer: Medicare Other | Attending: Hematology and Oncology

## 2023-05-27 DIAGNOSIS — C5701 Malignant neoplasm of right fallopian tube: Secondary | ICD-10-CM | POA: Insufficient documentation

## 2023-05-27 DIAGNOSIS — I517 Cardiomegaly: Secondary | ICD-10-CM | POA: Insufficient documentation

## 2023-05-27 DIAGNOSIS — C786 Secondary malignant neoplasm of retroperitoneum and peritoneum: Secondary | ICD-10-CM | POA: Insufficient documentation

## 2023-05-27 DIAGNOSIS — K640 First degree hemorrhoids: Secondary | ICD-10-CM | POA: Diagnosis not present

## 2023-05-27 DIAGNOSIS — K449 Diaphragmatic hernia without obstruction or gangrene: Secondary | ICD-10-CM | POA: Diagnosis not present

## 2023-05-27 DIAGNOSIS — Z7984 Long term (current) use of oral hypoglycemic drugs: Secondary | ICD-10-CM | POA: Diagnosis not present

## 2023-05-27 DIAGNOSIS — Z79899 Other long term (current) drug therapy: Secondary | ICD-10-CM | POA: Diagnosis not present

## 2023-05-27 DIAGNOSIS — R609 Edema, unspecified: Secondary | ICD-10-CM | POA: Insufficient documentation

## 2023-05-27 DIAGNOSIS — K5730 Diverticulosis of large intestine without perforation or abscess without bleeding: Secondary | ICD-10-CM | POA: Diagnosis not present

## 2023-05-27 DIAGNOSIS — J90 Pleural effusion, not elsewhere classified: Secondary | ICD-10-CM | POA: Insufficient documentation

## 2023-05-27 DIAGNOSIS — I700 Atherosclerosis of aorta: Secondary | ICD-10-CM | POA: Insufficient documentation

## 2023-05-27 DIAGNOSIS — K6389 Other specified diseases of intestine: Secondary | ICD-10-CM | POA: Diagnosis not present

## 2023-05-27 LAB — COMPREHENSIVE METABOLIC PANEL
ALT: 15 U/L (ref 0–44)
AST: 19 U/L (ref 15–41)
Albumin: 4.5 g/dL (ref 3.5–5.0)
Alkaline Phosphatase: 62 U/L (ref 38–126)
Anion gap: 7 (ref 5–15)
BUN: 11 mg/dL (ref 8–23)
CO2: 31 mmol/L (ref 22–32)
Calcium: 9.8 mg/dL (ref 8.9–10.3)
Chloride: 104 mmol/L (ref 98–111)
Creatinine, Ser: 0.56 mg/dL (ref 0.44–1.00)
GFR, Estimated: >60 mL/min (ref >60)
Glucose, Bld: 94 mg/dL (ref 70–99)
Potassium: 4.1 mmol/L (ref 3.5–5.1)
Sodium: 142 mmol/L (ref 135–145)
Total Bilirubin: 0.7 mg/dL (ref <1.2)
Total Protein: 7.2 g/dL (ref 6.5–8.1)

## 2023-05-27 LAB — CBC WITH DIFFERENTIAL/PLATELET
Abs Immature Granulocytes: 0.01 10*3/uL (ref 0.00–0.07)
Basophils Absolute: 0 10*3/uL (ref 0.0–0.1)
Basophils Relative: 1 %
Eosinophils Absolute: 0.2 10*3/uL (ref 0.0–0.5)
Eosinophils Relative: 4 %
HCT: 42.5 % (ref 36.0–46.0)
Hemoglobin: 13.6 g/dL (ref 12.0–15.0)
Immature Granulocytes: 0 %
Lymphocytes Relative: 28 %
Lymphs Abs: 1.3 10*3/uL (ref 0.7–4.0)
MCH: 29.9 pg (ref 26.0–34.0)
MCHC: 32 g/dL (ref 30.0–36.0)
MCV: 93.4 fL (ref 80.0–100.0)
Monocytes Absolute: 0.3 10*3/uL (ref 0.1–1.0)
Monocytes Relative: 7 %
Neutro Abs: 2.7 10*3/uL (ref 1.7–7.7)
Neutrophils Relative %: 60 %
Platelets: 242 10*3/uL (ref 150–400)
RBC: 4.55 MIL/uL (ref 3.87–5.11)
RDW: 13.6 % (ref 11.5–15.5)
WBC: 4.4 10*3/uL (ref 4.0–10.5)
nRBC: 0 % (ref 0.0–0.2)

## 2023-05-29 LAB — CA 125: Cancer Antigen (CA) 125: 3.1 U/mL (ref 0.0–38.1)

## 2023-05-30 ENCOUNTER — Inpatient Hospital Stay: Payer: Medicare Other

## 2023-06-02 ENCOUNTER — Other Ambulatory Visit: Payer: Self-pay

## 2023-06-02 ENCOUNTER — Encounter: Payer: Self-pay | Admitting: Hematology and Oncology

## 2023-06-02 ENCOUNTER — Inpatient Hospital Stay: Payer: Medicare Other | Admitting: Hematology and Oncology

## 2023-06-02 VITALS — BP 131/81 | HR 92 | Temp 98.3°F | Resp 18 | Ht 65.0 in | Wt 238.4 lb

## 2023-06-02 DIAGNOSIS — C5701 Malignant neoplasm of right fallopian tube: Secondary | ICD-10-CM | POA: Diagnosis not present

## 2023-06-02 NOTE — Assessment & Plan Note (Signed)
She had numerous CT imaging in the last 2 months Last CT imaging from September showed no signs of cancer recurrence With recent laxatives, she have less stool burden this time The patient is reassured I will see her back in 3 months for further follow-up She is overdue for pelvic exam and I recommend the patient to reach out to [facility] for pelvic exam

## 2023-06-02 NOTE — Progress Notes (Signed)
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 The patient knows to call the clinic with any problems, questions or concerns. The total time spent in the appointment was 20 minutes encounter with patients including review of chart and various tests results, discussions about plan of care and coordination of care plan   Artis Delay, MD 06/02/2023 1:13 PM  INTERVAL HISTORY: Please see below for problem oriented charting. she returns for surveillance follow-up for history of fallopian tube cancer She is doing well She has modified her diet and start taking probiotic and regular laxatives She have less sensation of bloating or constipation She has not had a pelvic exam since June of 2023  REVIEW OF SYSTEMS:   Constitutional: Denies fevers, chills or abnormal weight loss Eyes: Denies blurriness of vision Ears, nose, mouth, throat, and face: Denies mucositis or sore throat Respiratory: Denies cough, dyspnea or wheezes Cardiovascular: Denies palpitation, chest discomfort or lower extremity swelling Gastrointestinal:  Denies nausea, heartburn or change in bowel habits Skin: Denies  abnormal skin rashes Lymphatics: Denies new lymphadenopathy or easy bruising Neurological:Denies numbness, tingling or new weaknesses Behavioral/Psych: Mood is stable, no new changes  All other systems were reviewed with the patient and are negative.  I have reviewed the past medical history, past surgical history, social history and family history with the patient and they are unchanged from previous note.  ALLERGIES:  is allergic to macrobid [nitrofurantoin], ciprofloxacin, codeine, and azithromycin.  MEDICATIONS:  Current Outpatient Medications  Medication Sig Dispense Refill   acetaminophen (TYLENOL) 500 MG tablet Take 1,000 mg by mouth every 6 (six) hours as needed for mild pain, moderate pain, fever or headache.     albuterol (VENTOLIN HFA) 108 (90 Base) MCG/ACT inhaler Inhale 2 puffs into the lungs every 6 (six) hours as needed for wheezing or shortness of breath. 8 g 6   atorvastatin (LIPITOR) 40 MG tablet Take 1 tablet (40 mg total) by mouth daily. 90 tablet 3   Biotin 1000 MCG CHEW      cholecalciferol (VITAMIN D3) 25 MCG (1000 UNIT) tablet Take 1,000 Units by mouth daily.     co-enzyme Q-10 30 MG capsule Take by mouth.     estradiol (ESTRACE VAGINAL) 0.1 MG/GM vaginal cream Place 1 Applicatorful vaginally at bedtime. 42.5 g 12   hydrocortisone 2.5 % cream Apply topically.     loratadine (CLARITIN) 10 MG tablet Take 10 mg by mouth daily.     losartan (COZAAR) 50 MG tablet Take 50 mg by mouth daily.     Melatonin 5 MG CHEW Chew 20 mg by mouth at bedtime.     metFORMIN (  GLUCOPHAGE) 500 MG tablet Take 500 mg by mouth 2 (two) times daily.     Omega 3 1000 MG CAPS Take 1 capsule by mouth daily.     pantoprazole (PROTONIX) 40 MG tablet Take 1 tablet (40 mg total) by mouth daily. 30 tablet 1   polyethylene glycol (MIRALAX / GLYCOLAX) 17 g packet Take 8.5 g by mouth 2 (two) times daily.     Probiotic Product (PROBIOTIC BLEND PO) Take by mouth.     sennosides-docusate sodium (SENOKOT-S)  8.6-50 MG tablet Take 1 tablet by mouth 2 (two) times daily as needed for constipation.     UNABLE TO FIND Take 1 capsule by mouth daily. Med Name: [place] Tail Mushroom     No current facility-administered medications for this visit.    SUMMARY OF ONCOLOGIC HISTORY: Oncology History Overview Note  BRCA1/2 negative testing High grade serous, near complete pathological response to neoadjuvant chemo   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HCC) (Resolved)  12/30/2020 Initial Diagnosis   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HCC)   01/12/2021 - 03/11/2021 Chemotherapy   Patient is on Treatment Plan : OVARIAN Carboplatin (AUC 6) / Paclitaxel (175) q21d x 6 cycles     Adenocarcinoma of right fallopian tube (HCC)  07/18/2018 Procedure   Colonoscopy - Non-bleeding internal hemorrhoids. - Diverticulosis in the sigmoid colon and in the descending colon. - No specimens collected. - Blood in stool without cause found on endoscopic exam   05/08/2020 Imaging   1. Bladder is decompressed though demonstrates significant perivesicular hazy stranding, mucosal hyperemia and edematous mural thickening. Findings are suggestive of cystitis. Correlate with urinalysis. No abnormal perinephric or periureteral stranding or other features to suggest an ascending tract infection at this time. 2. Colonic diverticulosis without evidence of acute diverticulitis. 3. Aortic Atherosclerosis (ICD10-I70.0).   12/28/2020 Imaging   1. 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 Differential for peritoneal carcinomatosis is primarily neoplastic and includes cancers of the ovary, appendix, colon, pancreas and stomach. PET-CT may be helpful for further characterization. Tissue sampling of the mesentery may be eventually required for diagnosis. 2. Mildly distended small bowel loops throughout the  abdomen and pelvis, with fluid and associated air-fluid levels throughout the nondistended small bowel, suggesting a mild ileus versus partial small bowel obstruction. Favor partial small bowel obstruction with transition zone in the RIGHT lower quadrant, likely related to aforementioned neoplastic peritoneal implants and/or adhesions. 3. Small free fluid within the RIGHT upper quadrant and LEFT upper quadrant. No abscess collection seen. No free intraperitoneal air seen. 4. Extensive colonic diverticulosis without evidence of acute diverticulitis. 5. Small chronic pleural effusions with associated atelectasis.   12/29/2020 Tumor Marker   Patient's tumor was tested for the following markers: CA-125. Results of the tumor marker test revealed 95.1.   12/30/2020 Surgery   Preoperative diagnosis: abdominal nodules   Postoperative diagnosis: same   Procedure: diagnostic laparoscopy with abdominal wall biopsy   Surgeon: Feliciana Rossetti, M.D.    Indications for procedure: Sojourner Astacio is a 72 y.o. year old female with symptoms of nausea, vomiting, and abdominal pain. Work up was concerning for cancer with abdominal wall studding.   Description of procedure: The patient was brought into the operative suite. Anesthesia was administered with General endotracheal anesthesia. WHO checklist was applied. The patient was then placed in supine position. The area was prepped and draped in the usual sterile fashion.   A small left  subcostal incision was made. A 5mm trocar was used to gain access to the peritoneal cavity by optical entry technique. Pneumoperitoneum was applied with a high flow and low pressure. The laparoscope was reinserted to confirm position.   On initial visualization of the abdomen, there were multiple nodules throughout the abdominal wall. There were multiple nodules over the small intestine and mesentery. There was some loops of small intestine that were matted together. There was a  large white mass in the left upper quadrant. Multiple areas of peritoneum were removed with harmonic scalpel. The large mass was inspected. It appeared to be related to the colon and omentum. Grasper was used to remove some superficial tissue and was sent for culture.   The abdomen was desufflated. The incisions were closed with 4-0 monocryl subcuticular stitch.   12/30/2020 Pathology Results   FINAL MICROSCOPIC DIAGNOSIS:   A. ABDOMINAL WALL, NODULE, EXCISION:  -  Metastatic adenocarcinoma  -  See comment   B. PERI COLONIC EXUDATE, EXCISION:  -  Metastatic adenocarcinoma  -  See comment   COMMENT:   By immunohistochemistry, the neoplastic cells are positive for cytokeratin 7, PAX8 and WT1 but negative for cytokeratin 20, CDX2, p53 and TTF-1.  The morphology and immunophenotype are consistent with a gynecologic primary.     01/01/2021 Initial Diagnosis   Peritoneal carcinomatosis (HCC)   01/08/2021 Cancer Staging   Staging form: Ovary, Fallopian Tube, and Primary Peritoneal Carcinoma, AJCC 8th Edition - Clinical stage from 01/08/2021: cT3, cN0, cM0 - Signed by Artis Delay, MD on 01/08/2021 Stage prefix: Initial diagnosis   01/12/2021 - 03/11/2021 Chemotherapy   Patient is on Treatment Plan : OVARIAN Carboplatin (AUC 6) / Paclitaxel (175) q21d x 6 cycles     01/17/2021 Imaging   MRI brain No evidence of intracranial metastatic disease or other acute abnormality   02/12/2021 Genetic Testing   No pathogenic variants detected in [facility].  The report date is February 12, 2021.   The [facility] gene panel offered by [employer] includes sequencing and deletion/duplication testing of the following 48 genes: APC, ATM, AXIN2, BAP1, BARD1, BMPR1A, BRCA1, BRCA2, BRIP1, CHD1, CDK4, CDKN2A(p16 and p14ARF), CHEK2, CTNNA1, EGFR, EPCAM, FH, FLCN, GREM1, HOXB13, MEN1, MET, MITF , MLH1, MSH2, MSH3, MSH6, MUTYH, NTHL1, PALB2, PMS2, POLD1, POLE, PTEN, RAD51C, RAD51D, RET, SDHA,  SDHB, SDHC, SDHD, SMAD4, STK11,TERT, TP53, TSC1, TSC2, and VHL.  Unless otherwise specified, all coding regions and flanking non-coding regions are analyzed for sequence variation. Analysis of flanking intronic regions typically do not extend more than 20 bp before and 10 bp after each exon, though the exact region may be adjusted based on the presence of either potentially significant variants or highly repetitive sequences. Coding regions and proximal promoter regions near the transcription start sites are analyzed for large deletions or duplications. Specific genes are tested only for sequence and/or CNVs within limited regions. Limited clinically relevant regions are included for EGFR (sequencing and CNV analysis of exons 18-21),RET (sequencing and CNV analysis of exons 5, 8, 10, 11, and 13-16), and MITF (sequencing of position c.952). Only CNV analysis of the last two exons of EPCAM is performed. CNV analysis of GREM1 includes the upstream region overlapping the adjacent gene SCG5. MSH3 exon 1 contains a long polyalanine repeat that can interfere with variant calling; therefore, MSH3 analysis excludes c.121 to c.237. Only sequence analysis of the exons encompassing the exonuclease domains of these genes is performed (POLD1 c.841 to c.1686, POLE c.802 to  c.1473). Limited promoter regions in selected genes undergo sequence analysis including TERT (c.-71 to c.-1), and APC Promoter 1B (c.-195 to c.-190 and c.-125 ([id number]).   HRD testing pending.    03/02/2021 Imaging   Outside imaging at [facility] improved peritoneal carcinomatosis. No evidence of new metastatic disease or acute abdominal pathology.    03/02/2021 Imaging   Outside CT imaging done at [facility] improved peritoneal carcinomatosis. No evidence of new metastatic disease or acute abdominal pathology   03/02/2021 - 03/03/2021 Hospital Admission   She was hospitalized briefly for evaluation of hematochezia at [facility].  She underwent  CT imaging and colonoscopy and was subsequently discharged   03/03/2021 Procedure   Colonoscopy was performed at [facility]  Findings: Diverticula were found in the entire colon. Internal hemorrhoids were found during retroflexion.  The hemorrhoids were Grade I (internal hemorrhoids that do not prolapse). Impression: - Diverticulosis in the entire examined colon. Likely source of bleeding. - Internal hemorrhoids.   04/07/2021 Surgery   PROCEDURES:  Exam under anesthesia Diagnostic laparoscopy Exploratory laparotomy Lysis of adhesions (>30 minutes) Infragastric omentectomy Total abdominal hysterectomy and bilateral salpingo-oophorectomy Optimal (R0) interval tumor debulking  SURGEON: Surgeon(s) and Role: Leida Lauth, MD - Primary * Salinaro, Ivar Drape, MD - Resident - Assisting * Gloris Manchester, Kendrick Ranch, MD - Fellow  INDICATION(S): 72 y.o. who presented with carcinomatosis and mildly elevated CA125 to 95 with biopsy showing mullerian adenocarcinoma, s/p 3 cycles of neoadjuvant chemotherapy with normalization of CA125 and decreased disease burden on imaging.  OPERATIVE FINDINGS:  On exam, no palpable masses. On laparoscopy, omentum thickened and retracted along the transverse colon, pelvic adhesive disease. On laparotomy, omentum thickened, retracted, and densely adherent to the underlying colon mesentery, but without definitive viable tumor. Adhesions of the omentum to the descending colon, rectosigmoid colon to the left adnexa and posterior uterus and cervix, loop of ileum to the right adnexa, and bladder to anterior uterus and cervix. Transverse colon with small diverticulum that was over-sewed. Appendix normal and retrocecal. No grossly visible tumor in the abdomen including smooth diaphragm and liver surface, normal stomach, small bowel, colon, and normal appearing uterus, bilateral fallopian tubes, and bilateral ovaries.  At the conclusion of the case, no gross residual disease  remained (R0).    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   05/22/2021 - 06/19/2021 Chemotherapy   Patient is on Treatment Plan : OVARIAN Carboplatin (AUC 6) / Paclitaxel (175) q21d x 6 cycles      06/19/2021 Tumor Marker   Patient's tumor was tested for the following markers: CA-125. Results of the tumor marker test revealed 3.   07/22/2021 Tumor Marker   Patient's tumor was tested for the following markers: CA-125. Results of the tumor marker test revealed 3.1.   07/23/2021 Imaging   1. No evidence metastatic ovarian carcinoma. 2. No peritoneal nodularity or fluid. 3. Post hysterectomy and oophorectomy       08/19/2021 Procedure   Successful removal of RIGHT chest implanted Port-A-Cath, as above   09/24/2021 Tumor Marker   Patient's tumor was tested for the following markers: CA-125. Results of the tumor marker test revealed 3.7.   01/20/2022 Imaging   1. Stable. No evidence for metastatic disease in the abdomen or pelvis. 2. Tiny gas bubble identified in the  bladder lumen. Correlation witfor h recent instrumentation recommended. In the absence of recent instrumentation, bladder infection would be a consideration. 3. Tiny hiatal hernia. 4. Left colonic diverticulosis without diverticulitis. 5. Aortic Atherosclerosis (ICD10-I70.0).   02/01/2022 Tumor Marker   Patient's tumor was tested for the following markers: CA-125. Results of the tumor marker test revealed 3.8.   05/06/2022 Tumor Marker   Patient's tumor was tested for the following markers: CA-125. Results of the tumor marker test revealed 3.4.   05/06/2022 Imaging   1. Stable examination without evidence of local recurrence or metastatic disease within the chest, abdomen, or  pelvis. 2. Left-sided colonic diverticulosis without findings of acute diverticulitis. 3. Mosaic attenuation of the lung bases with mild diffuse bronchial wall thickening, findings which can be seen in the setting of small airways disease. 4.  Aortic Atherosclerosis (ICD10-I70.0).   08/09/2022 Tumor Marker   Patient's tumor was tested for the following markers: CA-125. Results of the tumor marker test revealed 2.8.   10/28/2022 Tumor Marker   Patient's tumor was tested for the following markers: CA-125. Results of the tumor marker test revealed 2.9.   11/12/2022 Imaging   CT ABDOMEN PELVIS W CONTRAST  Result Date: 11/12/2022 CLINICAL DATA:  Ovarian cancer restaging * Tracking Code: BO * EXAM: CT ABDOMEN AND PELVIS WITH CONTRAST TECHNIQUE: Multidetector CT imaging of the abdomen and pelvis was performed using the standard protocol following bolus administration of intravenous contrast. RADIATION DOSE REDUCTION: This exam was performed according to the departmental dose-optimization program which includes automated exposure control, adjustment of the mA and/or kV according to patient size and/or use of iterative reconstruction technique. CONTRAST:  OMNIPAQUE IOHEXOL 300 MG/ML  SOLN COMPARISON:  CT scan 06/07/2022 FINDINGS: Lower chest: Mild scarring in the right middle lobe medially. Mild cardiomegaly. Hepatobiliary: Unremarkable Pancreas: Unremarkable Spleen: Unremarkable Adrenals/Urinary Tract: Unremarkable Stomach/Bowel: Prominent stool throughout the colon favors constipation. Vascular/Lymphatic: Mild systemic atherosclerosis including the abdominal aorta. No pathologic adenopathy. Reproductive: Uterus absent. No nodularity along the vaginal cuff. Adnexa unremarkable. Other: No significant nodularity along the omentum or peritoneal surfaces. No ascites. Musculoskeletal: Lumbar spondylosis and degenerative disc disease resulting in multilevel impingement. Rectus diastasis with mild ventral hernia  containing adipose tissue and bowel as on image 56 series 2. No strangulation or complicating feature. IMPRESSION: 1. No findings of active malignancy. 2. Prominent stool throughout the colon favors constipation. 3. Mild cardiomegaly. 4. Lumbar spondylosis and degenerative disc disease resulting in multilevel impingement. 5. Rectus diastasis with mild ventral hernia containing adipose tissue and bowel. No strangulation or complicating feature. 6. Mild systemic atherosclerosis. Aortic Atherosclerosis (ICD10-I70.0). Electronically Signed   By: Gaylyn Rong M.D.   On: 11/12/2022 14:34      02/15/2023 Tumor Marker   Patient's tumor was tested for the following markers: CA-125. Results of the tumor marker test revealed 2.5.   02/22/2023 Imaging   CT ABDOMEN PELVIS W CONTRAST  Result Date: 03/03/2023 CLINICAL DATA:  Follow-up right fallopian tube cancer EXAM: CT ABDOMEN AND PELVIS WITH CONTRAST TECHNIQUE: Multidetector CT imaging of the abdomen and pelvis was performed using the standard protocol following bolus administration of intravenous contrast. RADIATION DOSE REDUCTION: This exam was performed according to the departmental dose-optimization program which includes automated exposure control, adjustment of the mA and/or kV according to patient size and/or use of iterative reconstruction technique. CONTRAST:  OMNIPAQUE IOHEXOL 300 MG/ML  SOLN COMPARISON:  11/09/2022 FINDINGS: Lower chest: Lung bases are clear. Hepatobiliary: Liver is within normal limits. Gallbladder is unremarkable.  No intrahepatic or extrahepatic ductal dilatation. Pancreas: Within normal limits. Spleen: Within normal limits. Adrenals/Urinary Tract: Adrenal glands within normal limits. Kidneys are within normal limits. No hydronephrosis. Bladder is underdistended but unremarkable. Stomach/Bowel: Stomach is notable for a small hiatal hernia. No evidence of bowel obstruction. Normal appendix (series 301/image 45). Left colonic  diverticulosis, without diverticulitis. Vascular/Lymphatic: No evidence of abdominal aortic aneurysm. Atherosclerotic calcifications of the abdominal aorta and branch vessels, although they remain patent. No suspicious abdominopelvic lymphadenopathy. Reproductive: Status post hysterectomy and bilateral salpingo oophorectomy. Other: No abdominopelvic ascites. No frank peritoneal nodularity/omental caking. Musculoskeletal: Mild degenerative changes of the visualized thoracolumbar spine. IMPRESSION: Status post hysterectomy and bilateral salpingo oophorectomy. No evidence of recurrent or metastatic disease in the abdomen/pelvis. Electronically Signed   By: Charline Bills M.D.   On: 03/03/2023 00:07      05/30/2023 Tumor Marker   Patient's tumor was tested for the following markers: CA-125. Results of the tumor marker test revealed 3.9.     PHYSICAL EXAMINATION: ECOG PERFORMANCE STATUS: 0 - Asymptomatic  Vitals:   06/02/23 1256  BP: 131/81  Pulse: 92  Resp: 18  Temp: 98.3 F (36.8 C)  SpO2: 95%   Filed Weights   06/02/23 1256  Weight: 238 lb 6.4 oz (108.1 kg)    GENERAL:alert, no distress and comfortable SKIN: skin color, texture, turgor are normal, no rashes or significant lesions EYES: normal, Conjunctiva are pink and non-injected, sclera clear OROPHARYNX:no exudate, no erythema and lips, buccal mucosa, and tongue normal  NECK: supple, thyroid normal size, non-tender, without nodularity LYMPH:  no palpable lymphadenopathy in the cervical, axillary or inguinal LUNGS: clear to auscultation and percussion with normal breathing effort HEART: regular rate & rhythm and no murmurs and no lower extremity edema ABDOMEN:abdomen soft, non-tender and normal bowel sounds Musculoskeletal:no cyanosis of digits and no clubbing  NEURO: alert & oriented x 3 with fluent speech, no focal motor/sensory deficits  LABORATORY DATA:  I have reviewed the data as listed    Component Value Date/Time    NA 142 05/27/2023 1352   NA 143 12/20/2019 0829   K 4.1 05/27/2023 1352   CL 104 05/27/2023 1352   CO2 31 05/27/2023 1352   GLUCOSE 94 05/27/2023 1352   BUN 11 05/27/2023 1352   BUN 16 12/20/2019 0829   CREATININE 0.56 05/27/2023 1352   CREATININE 0.62 10/26/2022 0815   CALCIUM 9.8 05/27/2023 1352   PROT 7.2 05/27/2023 1352   PROT 6.4 02/04/2020 1036   ALBUMIN 4.5 05/27/2023 1352   ALBUMIN 4.2 02/04/2020 1036   AST 19 05/27/2023 1352   AST 17 10/26/2022 0815   ALT 15 05/27/2023 1352   ALT 17 10/26/2022 0815   ALKPHOS 62 05/27/2023 1352   BILITOT 0.7 05/27/2023 1352   BILITOT 0.9 10/26/2022 0815   GFRNONAA >60 05/27/2023 1352   GFRNONAA >60 10/26/2022 0815   GFRAA 104 12/20/2019 0829    No results found for: "SPEP", "UPEP"  Lab Results  Component Value Date   WBC 4.4 05/27/2023   NEUTROABS 2.7 05/27/2023   HGB 13.6 05/27/2023   HCT 42.5 05/27/2023   MCV 93.4 05/27/2023   PLT 242 05/27/2023      Chemistry      Component Value Date/Time   NA 142 05/27/2023 1352   NA 143 12/20/2019 0829   K 4.1 05/27/2023 1352   CL 104 05/27/2023 1352   CO2 31 05/27/2023 1352   BUN 11 05/27/2023 1352   BUN 16 12/20/2019 0829  CREATININE 0.56 05/27/2023 1352   CREATININE 0.62 10/26/2022 0815      Component Value Date/Time   CALCIUM 9.8 05/27/2023 1352   ALKPHOS 62 05/27/2023 1352   AST 19 05/27/2023 1352   AST 17 10/26/2022 0815   ALT 15 05/27/2023 1352   ALT 17 10/26/2022 0815   BILITOT 0.7 05/27/2023 1352   BILITOT 0.9 10/26/2022 0815       RADIOGRAPHIC STUDIES: I have personally reviewed the radiological images as listed and agreed with the findings in the report. MM 3D SCREENING MAMMOGRAM BILATERAL BREAST Result Date: 05/16/2023 CLINICAL DATA:  Screening. EXAM: DIGITAL SCREENING BILATERAL MAMMOGRAM WITH TOMOSYNTHESIS AND CAD TECHNIQUE: Bilateral screening digital craniocaudal and mediolateral oblique mammograms were obtained. Bilateral screening digital breast  tomosynthesis was performed. The images were evaluated with computer-aided detection. COMPARISON:  Previous exam(s). ACR Breast Density Category b: There are scattered areas of fibroglandular density. FINDINGS: There are no findings suspicious for malignancy. IMPRESSION: No mammographic evidence of malignancy. A result letter of this screening mammogram will be mailed directly to the patient. RECOMMENDATION: Screening mammogram in one year. (Code:SM-B-01Y) BI-RADS CATEGORY  1: Negative. Electronically Signed   By: Elberta Fortis M.D.   On: 05/16/2023 17:02

## 2023-06-03 ENCOUNTER — Inpatient Hospital Stay: Payer: Medicare Other | Admitting: Hematology and Oncology

## 2023-06-03 ENCOUNTER — Ambulatory Visit: Payer: Medicare Other | Admitting: Hematology and Oncology

## 2023-07-26 ENCOUNTER — Encounter: Payer: Self-pay | Admitting: Pulmonary Disease

## 2023-07-26 ENCOUNTER — Ambulatory Visit (INDEPENDENT_AMBULATORY_CARE_PROVIDER_SITE_OTHER): Payer: Medicare Other | Admitting: Pulmonary Disease

## 2023-07-26 DIAGNOSIS — J4520 Mild intermittent asthma, uncomplicated: Secondary | ICD-10-CM

## 2023-07-26 MED ORDER — ALBUTEROL SULFATE HFA 108 (90 BASE) MCG/ACT IN AERS: 2.0000 | INHALATION_SPRAY | Freq: Four times a day (QID) | RESPIRATORY_TRACT | 6 refills | Status: AC | PRN

## 2023-07-26 MED ORDER — BUDESONIDE-FORMOTEROL FUMARATE 80-4.5 MCG/ACT IN AERO: 2.0000 | INHALATION_SPRAY | Freq: Two times a day (BID) | RESPIRATORY_TRACT | 12 refills | Status: DC | PRN

## 2023-07-26 NOTE — Patient Instructions (Addendum)
 Use breyna  inhaler 2 puffs twice daily as needed for cough wheezing or shortness of breath - rinse mouth out after each use  Use albuterol  inhaler 1-2 puffs every 4-6 hours as needed  Try Ayr Saline gel at night for nasal dryness  Consider using a small room humidifier and see if that helps with your dry eyes  Continue CPAP nightly  Follow up in 1 year, call sooner if needed

## 2023-07-26 NOTE — Progress Notes (Signed)
 Synopsis: Hospital follow up for dyspnea, cough and OSA.  Subjective:   PATIENT ID: Brittany Middleton GENDER: female DOB: [birth year], MRN: [id number]  HPI  Chief Complaint  Patient presents with   Follow-up    Pt is wondering if CPAP can create dry eyes. When pt wakes up eyes feel griddy. Pressure 4-5cm.    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   OV 11/04/21 She has noted increased usage of her albuterol  inhaler due to spring allergies.  She has run out of her flovent  inhaler.   She is using her CPAP machine nightly 4-8cmH2O auto titrating. She was able to get a new machine.   Overall she is feeling well since completing all of her ovarian cancer treatments. She has requested CT Chest scans for surveillance of her disease as she knows it can spread to her lungs.   OV 07/26/23 - today She is feeling well overall and rarely uses her inhaler, except during high pollen or allergy seasons. She inquires about the efficacy of inhalers past their expiration date, noting that her albuterol  inhaler has expired. She also mentions that she does not frequently use her Flovent  inhaler and is unsure of its current level. No current issues with breathing.  She discusses her use of a CPAP machine for sleep apnea, noting that she does not use water in the tank during the summer due to humidity. Recently, she experienced slightly elevated eye pressure during an eye exam, with readings between fifteen to nineteen. She has noticed her eyes feeling gritty and has started using eye drops. She questions whether the CPAP could be causing dry eyes, although she maintains a good seal with her nasal pillows and feels rested upon waking. She also  experiences occasional small epistaxis, which she attributes to dryness. No significant air leaks from CPAP mask.  She mentions a past diagnosis of ovarian cancer, reaching her two-year anniversary in October. She reflects on her health journey, noting improvements in exercise and breathing, and expresses feeling 'really good' overall.  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 Last Assessment & Plan:  Formatting of this note might be different from the original. - continue home meds   Family history of breast cancer 01/27/2021   Family history of lung cancer 01/27/2021   Gastrointestinal bleeding 07/25/2018   Gastrointestinal hemorrhage with melena 07/16/2018   Genetic testing 02/13/2021   No pathogenic variants detected in [facility].  The report date is February 12, 2021.   The [facility] gene panel offered by [employer] includes sequencing and deletion/duplication testing of  the following 48 genes: APC, ATM, AXIN2, BAP1, BARD1, BMPR1A, BRCA1, BRCA2, BRIP1, CHD1, CDK4, CDKN2A(p16 and p14ARF), CHEK2, CTNNA1, EGFR, EPCAM, FH, FLCN, GREM1, HOXB13, MEN1, M   GERD (gastroesophageal reflux disease)    Hematuria 06/19/2020   Hepatic steatosis    per imaging 2016, 2019   Hiatal hernia    History of palpitations    Holter monitor, 2017: NSR, occasional  PVC, PACs, no arrhythmia   HLD (hyperlipidemia) 02/27/2018   HTN (hypertension) 02/27/2018   Hypercholesteremia    Hypercholesterolemia 04/07/2017   Hyperglycemia 06/19/2020   Hypertension    Hypertensive disorder 04/07/2017   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   Hypokalemia 07/17/2018   Lumbar disc disease    MRI, 2017    Obesity, Class III, 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   Overweight 03/03/2018   Palpitations 08/19/2015   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HCC)    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   Physical debility 01/10/2021   Pneumonia    Pure hypercholesterolemia, unspecified 04/07/2017   Pyelonephritis 03/30/2020   Recurrent UTI 05/28/2020   SBO (small bowel obstruction) (HCC) 12/28/2020   Sepsis secondary to UTI (HCC) 04/25/2020   Severe sepsis (HCC) 05/21/2020   Severe sepsis with acute organ dysfunction (HCC) 04/25/2020   Skin infection 02/05/2021   Skin irritation 02/05/2021   Sleep apnea    Urinary tract infection due to extended-spectrum beta lactamase (ESBL) producing Escherichia coli    Uterine cancer (HCC) 01/01/2021     Family History  Problem Relation Age of Onset   CAD Mother    Breast cancer Mother 51   Lung cancer Father 65   Cancer Paternal Aunt        GYN cancer? bladder? d. early 56s   Cancer Paternal Aunt 62       GYN cancer   Lung cancer Paternal Grandfather        d. 40; smoking hx     Social History   Socioeconomic History   Marital status: Divorced    Spouse name: Not on file   Number of children: 2   Years of education: Not on file   Highest education level: Master's degree (e.g., MA, MS, MEng, MEd, MSW, MBA)  Occupational History   Occupation: Retired  Tobacco Use   Smoking status: Never   Smokeless tobacco: Never  Vaping Use   Vaping status: Never Used  Substance and Sexual Activity   Alcohol use: No   Drug use: No   Sexual activity: Not on file  Other Topics Concern   Not on file  Social History Narrative   Not on  file   Social Drivers of Health   Financial Resource Strain: Low Risk  (02/27/2018)   Overall Financial Resource Strain (CARDIA)    Difficulty of Paying Living Expenses: Not hard at all  Food Insecurity: No Food Insecurity (02/27/2018)   Hunger Vital Sign    Worried About Running Out of Food in the Last Year: Never true    Ran Out of Food in the Last Year: Never true  Transportation Needs: No Transportation Needs (01/27/2021)   Received from [facility], [facility]   PRAPARE - Transportation    In the past 12 months, has lack of transportation kept you from medical appointments or from getting medications?: No    Lack of Transportation (Non-Medical): No  Physical Activity: Sufficiently Active (02/27/2018)   Exercise Vital Sign    Days of Exercise per Week: 7 days  Minutes of Exercise per Session: 60 min  Stress: No Stress Concern Present (02/27/2018)   [employer] of Occupational Health - Occupational Stress Questionnaire    Feeling of Stress : Only a little  Social Connections: Somewhat Isolated (02/27/2018)   Social Connection and Isolation Panel [NHANES]    Frequency of Communication with Friends and Family: More than three times a week    Frequency of Social Gatherings with Friends and Family: More than three times a week    Attends Religious Services: Never    Database Administrator or Organizations: Yes    Attends Banker Meetings: 1 to 4 times per year    Marital Status: Divorced  Catering Manager Violence: Not At Risk (02/27/2018)   Humiliation, Afraid, Rape, and Kick questionnaire    Fear of Current or Ex-Partner: No    Emotionally Abused: No    Physically Abused: No    Sexually Abused: No     Allergies  Allergen Reactions   Macrobid  [Nitrofurantoin ] Shortness Of Breath and Cough    Short of breath, cough, myalgia   Ciprofloxacin Other (See Comments)    Caused PAIN IN HANDS    Codeine Nausea And Vomiting   Azithromycin  Palpitations     Outpatient Medications Prior to Visit  Medication Sig Dispense Refill   acetaminophen  (TYLENOL ) 500 MG tablet Take 1,000 mg by mouth every 6 (six) hours as needed for mild pain, moderate pain, fever or headache.     atorvastatin  (LIPITOR) 40 MG tablet Take 1 tablet (40 mg total) by mouth daily. 90 tablet 3   Biotin 1000 MCG CHEW      cholecalciferol  (VITAMIN D3) 25 MCG (1000 UNIT) tablet Take 1,000 Units by mouth daily.     co-enzyme Q-10 30 MG capsule Take by mouth.     estradiol  (ESTRACE  VAGINAL) 0.1 MG/GM vaginal cream Place 1 Applicatorful vaginally at bedtime. 42.5 g 12   hydrocortisone 2.5 % cream Apply topically.     loratadine  (CLARITIN ) 10 MG tablet Take 10 mg by mouth daily.     losartan  (COZAAR ) 50 MG tablet Take 50 mg by mouth daily.     Melatonin 5 MG CHEW Chew 20 mg by mouth at bedtime.     metFORMIN (GLUCOPHAGE) 500 MG tablet Take 500 mg by mouth 2 (two) times daily.     Omega 3 1000 MG CAPS Take 1 capsule by mouth daily.     pantoprazole  (PROTONIX ) 40 MG tablet Take 1 tablet (40 mg total) by mouth daily. 30 tablet 1   polyethylene glycol (MIRALAX  / GLYCOLAX ) 17 g packet Take 8.5 g by mouth 2 (two) times daily.     Probiotic Product (PROBIOTIC BLEND PO) Take by mouth.     sennosides-docusate sodium  (SENOKOT-S) 8.6-50 MG tablet Take 1 tablet by mouth 2 (two) times daily as needed for constipation.     UNABLE TO FIND Take 1 capsule by mouth daily. Med Name: Turkey Tail Mushroom     albuterol  (VENTOLIN  HFA) 108 (90 Base) MCG/ACT inhaler Inhale 2 puffs into the lungs every 6 (six) hours as needed for wheezing or shortness of breath. 8 g 6   No facility-administered medications prior to visit.    Review of Systems  Constitutional:  Negative for chills, fever, malaise/fatigue and weight loss.  HENT:  Negative for sinus pain and sore throat.   Eyes: Negative.   Respiratory:  Negative for cough, shortness of breath and wheezing.   Cardiovascular:  Negative for  chest pain, claudication and leg swelling.  Gastrointestinal:  Negative for abdominal pain, heartburn, nausea and vomiting.  Musculoskeletal: Negative.   Neurological:  Negative for dizziness, weakness and headaches.  Endo/Heme/Allergies: Negative.   Psychiatric/Behavioral: Negative.      Objective:   Vitals:   07/26/23 0844  BP: 124/62  Pulse: (!) 58  SpO2: 98%  Weight: 242 lb 3.2 oz (109.9 kg)  Height: 5' 5 (1.651 m)    Physical Exam Constitutional:      General: She is not in acute distress.    Appearance: She is obese. She is not ill-appearing.  HENT:     Head: Normocephalic and atraumatic.  Eyes:     Conjunctiva/sclera: Conjunctivae normal.  Cardiovascular:     Rate and Rhythm: Normal rate and regular rhythm.     Pulses: Normal pulses.     Heart sounds: Normal heart sounds. No murmur heard. Pulmonary:     Effort: Pulmonary effort is normal.     Breath sounds: No wheezing, rhonchi or rales.  Musculoskeletal:     Right lower leg: No edema.     Left lower leg: No edema.  Skin:    General: Skin is warm and dry.     Capillary Refill: Capillary refill takes less than 2 seconds.  Neurological:     General: No focal deficit present.     Mental Status: She is alert.     Gait: Gait normal.     CBC    Component Value Date/Time   WBC 4.4 05/27/2023 1352   RBC 4.55 05/27/2023 1352   HGB 13.6 05/27/2023 1352   HGB 13.3 05/04/2022 1337   HGB 13.9 12/20/2019 0829   HCT 42.5 05/27/2023 1352   HCT 42.5 12/20/2019 0829   PLT 242 05/27/2023 1352   PLT 206 05/04/2022 1337   PLT 207 12/20/2019 0829   MCV 93.4 05/27/2023 1352   MCV 93 12/20/2019 0829   MCH 29.9 05/27/2023 1352   MCHC 32.0 05/27/2023 1352   RDW 13.6 05/27/2023 1352   RDW 13.5 12/20/2019 0829   LYMPHSABS 1.3 05/27/2023 1352   LYMPHSABS 1.3 12/20/2019 0829   MONOABS 0.3 05/27/2023 1352   EOSABS 0.2 05/27/2023 1352   EOSABS 0.2 12/20/2019 0829   BASOSABS 0.0 05/27/2023 1352   BASOSABS 0.0 12/20/2019  0829      Latest Ref Rng & Units 05/27/2023    1:52 PM 02/14/2023    7:24 AM 10/26/2022    8:15 AM  BMP  Glucose 70 - 99 mg/dL 94  890  897   BUN 8 - 23 mg/dL 11  12  14    Creatinine 0.44 - 1.00 mg/dL 9.43  9.31  9.37   Sodium 135 - 145 mmol/L 142  143  139   Potassium 3.5 - 5.1 mmol/L 4.1  4.5  3.9   Chloride 98 - 111 mmol/L 104  105  103   CO2 22 - 32 mmol/L 31  31  29    Calcium  8.9 - 10.3 mg/dL 9.8  9.6  9.0     Chest imaging: CTA Chest 06/07/22 Mediastinum/Nodes: No suspicious thyroid  nodule. No pathologically enlarged mediastinal, hilar or axillary lymph nodes. The esophagus is grossly unremarkable.   Lungs/Pleura: No suspicious pulmonary nodules or masses. No pleural effusion. No pneumothorax. Hypoventilatory change in the lung bases.  CXR 05/21/20 Increased attenuation towards the lung bases likely attributable in part to body habitus. Mild diffuse interstitial opacity with vascular cephalization and congestion, cuffing, peripheral septal lines and  fissural thickening. No visible pneumothorax or effusion.  CTA Chest 04/27/20 1. No acute pulmonary embolism. 2. Small bilateral pleural effusions with adjacent atelectasis. 3. Stable mild interlobular septal thickening, which may be seen in patients with mild interstitial edema. 4. Aberrant right subclavian artery, a normal variant.  PFT:    Latest Ref Rng & Units 05/08/2020   10:51 AM  PFT Results  FVC-Pre L 2.63   FVC-Predicted Pre % 81   FVC-Post L 2.74   FVC-Predicted Post % 85   Pre FEV1/FVC % % 81   Post FEV1/FCV % % 85   FEV1-Pre L 2.13   FEV1-Predicted Pre % 86   FEV1-Post L 2.33   DLCO uncorrected ml/min/mmHg 27.60   DLCO UNC% % 135   DLCO corrected ml/min/mmHg 28.51   DLCO COR %Predicted % 140   DLVA Predicted % 150   TLC L 4.74   TLC % Predicted % 91   RV % Predicted % 73    Echo: 04/27/20 1. Left ventricular ejection fraction, by estimation, is 55 to 60%. The  left ventricle has normal function.  The left ventricle has no regional  wall motion abnormalities. Left ventricular diastolic parameters were  normal.   2. Right ventricular systolic function is normal. The right ventricular  size is normal.   3. Left atrial size was mildly dilated.   4. The mitral valve is normal in structure. No evidence of mitral valve  regurgitation. No evidence of mitral stenosis.   5. The aortic valve was not well visualized. Aortic valve regurgitation  is not visualized. No aortic stenosis is present.   6. The inferior vena cava is dilated in size with >50% respiratory  variability, suggesting right atrial pressure of 8 mmHg.   Sleep Study 06/18/20 IMPRESSIONS - Moderate to severe obstructive sleep apnea with an AHI of 29.9 and SpO2 low of 85%. - She did well with CPAP at 8 cm H2O.  - She did not require supplemental oxygen during this study.  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   She has mild intermittent asthma that can be aggravated by GERD, sinus disease and spring allergies.   We discussed that she can use breyna  inhaler 2 puffs twice daily as needed. If she is noticing persistent symptoms for multiple days in a row, then she can take 2 puffs twice daily scheduled. She can continue to use albuterol  as needed.    She is to use flonase  as needed for nasal/sinus congestion or drainage.   She is to continue taking pantoprazole  and elevating the head of her bed for GERD treatment.   She is to continue on CPAP therapy with a  pressure of 4-8cmH2O auto-titration. Compliance is good. I do not believe the CPAP is contributing to her dry eye symptoms.   Follow up in 1 year  Dorn Chill, MD [facility] Office: [phone]    Current Outpatient Medications:    acetaminophen  (TYLENOL ) 500 MG tablet, Take 1,000 mg by mouth every 6 (six) hours as needed for mild pain, moderate pain, fever or headache., Disp: ,  Rfl:    atorvastatin  (LIPITOR) 40 MG tablet, Take 1 tablet (40 mg total) by mouth daily., Disp: 90 tablet, Rfl: 3   Biotin 1000 MCG CHEW, , Disp: , Rfl:    budesonide -formoterol  (BREYNA ) 80-4.5 MCG/ACT inhaler, Inhale 2 puffs into the lungs 2 (two) times daily as needed., Disp: 1 each, Rfl: 12   cholecalciferol  (VITAMIN D3) 25 MCG (1000 UNIT) tablet, Take 1,000 Units by mouth daily., Disp: , Rfl:    co-enzyme Q-10 30 MG capsule, Take by mouth., Disp: , Rfl:    estradiol  (ESTRACE  VAGINAL) 0.1 MG/GM vaginal cream, Place 1 Applicatorful vaginally at bedtime., Disp: 42.5 g, Rfl: 12   hydrocortisone 2.5 % cream, Apply topically., Disp: , Rfl:    loratadine  (CLARITIN ) 10 MG tablet, Take 10 mg by mouth daily., Disp: , Rfl:    losartan  (COZAAR ) 50 MG tablet, Take 50 mg by mouth daily., Disp: , Rfl:    Melatonin 5 MG CHEW, Chew 20 mg by mouth at bedtime., Disp: , Rfl:    metFORMIN (GLUCOPHAGE) 500 MG tablet, Take 500 mg by mouth 2 (two) times daily., Disp: , Rfl:    Omega 3 1000 MG CAPS, Take 1 capsule by mouth daily., Disp: , Rfl:    pantoprazole  (PROTONIX ) 40 MG tablet, Take 1 tablet (40 mg total) by mouth daily., Disp: 30 tablet, Rfl: 1   polyethylene glycol (MIRALAX  / GLYCOLAX ) 17 g packet, Take 8.5 g by mouth 2 (two) times daily., Disp: , Rfl:    Probiotic Product (PROBIOTIC BLEND PO), Take by mouth., Disp: , Rfl:    sennosides-docusate sodium  (SENOKOT-S) 8.6-50 MG tablet, Take 1 tablet by mouth 2 (two) times daily as needed for constipation., Disp: , Rfl:    UNABLE TO FIND, Take 1 capsule  by mouth daily. Med Name: Turkey Tail Mushroom, Disp: , Rfl:    albuterol  (VENTOLIN  HFA) 108 (90 Base) MCG/ACT inhaler, Inhale 2 puffs into the lungs every 6 (six) hours as needed for wheezing or shortness of breath., Disp: 8.5 g, Rfl: 6

## 2023-07-29 ENCOUNTER — Encounter: Payer: Self-pay | Admitting: Pulmonary Disease

## 2023-08-25 ENCOUNTER — Inpatient Hospital Stay: Payer: Medicare Other | Attending: Hematology and Oncology

## 2023-08-25 DIAGNOSIS — K5900 Constipation, unspecified: Secondary | ICD-10-CM | POA: Insufficient documentation

## 2023-08-25 DIAGNOSIS — C786 Secondary malignant neoplasm of retroperitoneum and peritoneum: Secondary | ICD-10-CM | POA: Insufficient documentation

## 2023-08-25 DIAGNOSIS — D72819 Decreased white blood cell count, unspecified: Secondary | ICD-10-CM | POA: Diagnosis not present

## 2023-08-25 DIAGNOSIS — C5701 Malignant neoplasm of right fallopian tube: Secondary | ICD-10-CM | POA: Diagnosis present

## 2023-08-25 DIAGNOSIS — E669 Obesity, unspecified: Secondary | ICD-10-CM | POA: Insufficient documentation

## 2023-08-25 LAB — COMPREHENSIVE METABOLIC PANEL
ALT: 13 U/L (ref 0–44)
AST: 18 U/L (ref 15–41)
Albumin: 4.4 g/dL (ref 3.5–5.0)
Alkaline Phosphatase: 62 U/L (ref 38–126)
Anion gap: 8 (ref 5–15)
BUN: 14 mg/dL (ref 8–23)
CO2: 33 mmol/L — ABNORMAL HIGH (ref 22–32)
Calcium: 9.4 mg/dL (ref 8.9–10.3)
Chloride: 105 mmol/L (ref 98–111)
Creatinine, Ser: 0.63 mg/dL (ref 0.44–1.00)
GFR, Estimated: >60 mL/min (ref >60)
Glucose, Bld: 95 mg/dL (ref 70–99)
Potassium: 4.3 mmol/L (ref 3.5–5.1)
Sodium: 146 mmol/L — ABNORMAL HIGH (ref 135–145)
Total Bilirubin: 0.9 mg/dL (ref 0.0–1.2)
Total Protein: 7.1 g/dL (ref 6.5–8.1)

## 2023-08-25 LAB — CBC WITH DIFFERENTIAL/PLATELET
Abs Immature Granulocytes: 0.01 10*3/uL (ref 0.00–0.07)
Basophils Absolute: 0 10*3/uL (ref 0.0–0.1)
Basophils Relative: 1 %
Eosinophils Absolute: 0.2 10*3/uL (ref 0.0–0.5)
Eosinophils Relative: 6 %
HCT: 40.2 % (ref 36.0–46.0)
Hemoglobin: 13 g/dL (ref 12.0–15.0)
Immature Granulocytes: 0 %
Lymphocytes Relative: 31 %
Lymphs Abs: 0.8 10*3/uL (ref 0.7–4.0)
MCH: 30.2 pg (ref 26.0–34.0)
MCHC: 32.3 g/dL (ref 30.0–36.0)
MCV: 93.3 fL (ref 80.0–100.0)
Monocytes Absolute: 0.3 10*3/uL (ref 0.1–1.0)
Monocytes Relative: 10 %
Neutro Abs: 1.4 10*3/uL — ABNORMAL LOW (ref 1.7–7.7)
Neutrophils Relative %: 52 %
Platelets: 208 10*3/uL (ref 150–400)
RBC: 4.31 MIL/uL (ref 3.87–5.11)
RDW: 13.6 % (ref 11.5–15.5)
WBC: 2.7 10*3/uL — ABNORMAL LOW (ref 4.0–10.5)
nRBC: 0 % (ref 0.0–0.2)

## 2023-08-26 LAB — CA 125: Cancer Antigen (CA) 125: 2.9 U/mL (ref 0.0–38.1)

## 2023-09-01 ENCOUNTER — Encounter: Payer: Self-pay | Admitting: Hematology and Oncology

## 2023-09-01 ENCOUNTER — Inpatient Hospital Stay (HOSPITAL_BASED_OUTPATIENT_CLINIC_OR_DEPARTMENT_OTHER): Payer: Medicare Other | Admitting: Hematology and Oncology

## 2023-09-01 VITALS — BP 156/69 | HR 55 | Temp 98.0°F | Resp 18 | Ht 65.0 in | Wt 236.8 lb

## 2023-09-01 DIAGNOSIS — D72819 Decreased white blood cell count, unspecified: Secondary | ICD-10-CM | POA: Diagnosis not present

## 2023-09-01 DIAGNOSIS — C5701 Malignant neoplasm of right fallopian tube: Secondary | ICD-10-CM

## 2023-09-01 DIAGNOSIS — K5909 Other constipation: Secondary | ICD-10-CM

## 2023-09-01 NOTE — Assessment & Plan Note (Signed)
 She has chronic constipation, exacerbated by recent medication We discussed importance of regular laxatives

## 2023-09-01 NOTE — Assessment & Plan Note (Signed)
 Last B12 level was adequate.   She is not symptomatic Observe only

## 2023-09-01 NOTE — Assessment & Plan Note (Signed)
 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  She was prescribed neoadjuvant chemotherapy followed by interval debulking surgery and completed adjuvant treatment by December 2022 Pathology: High grade serous, BRCA1/2 negative testing  She is on active surveillance She has no signs or symptoms to suggest cancer recurrence We discussed tumor marker monitoring I plan to order imaging study again in September and if the results are normal, I will space out her follow-up to once a year

## 2023-09-01 NOTE — Assessment & Plan Note (Addendum)
 The patient was prescribed Ozempic and appears to tolerate that well I encouraged the patient to continue treatment Cancer recurrence is associated with risk of obesity

## 2023-09-01 NOTE — Progress Notes (Signed)
 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  She was prescribed neoadjuvant chemotherapy followed by interval debulking surgery and completed adjuvant treatment by December 2022 Pathology: High grade serous, BRCA1/2 negative testing  She is on active surveillance She has no signs or symptoms to suggest cancer recurrence We discussed tumor marker monitoring I plan to order imaging study again in September and if the results are normal, I will space out her follow-up to once a year  Leukopenia Last B12 level was adequate.   She is not symptomatic Observe only  Obesity, Class III, BMI 40-49.9 (morbid obesity) (HCC) The patient was prescribed Ozempic and appears to tolerate that well I encouraged the patient to continue treatment Cancer recurrence is associated with risk of obesity  Other constipation She has chronic constipation, exacerbated by recent medication We discussed importance of regular laxatives  Orders Placed This Encounter  Procedures   CT ABDOMEN PELVIS W CONTRAST    Standing Status:   Future    Expected Date:   02/21/2024    Expiration Date:   08/31/2024    Scheduling Instructions:     No need oral contrast    If indicated for the ordered procedure, I authorize the administration of contrast media per Radiology protocol:   Yes    Does the patient have a contrast media/X-ray dye allergy?:   No    Preferred imaging location?:   [facility]    If indicated for the ordered procedure, I authorize the administration of oral contrast media per Radiology protocol:   No    Reason for no oral contrast::   No need oral contrast      Artis Delay, MD 09/01/2023 10:03 AM  INTERVAL HISTORY: she returns for surveillance follow-up for history of right fallopian tube cancer She has recent exacerbation of chronic constipation due to Ozempic She denies abdominal bloating  I reviewed recent result of CBC, CMP and tumor marker  PHYSICAL EXAMINATION: ECOG PERFORMANCE STATUS: 0 - Asymptomatic  Vitals:   09/01/23 0910  BP: (!) 156/69  Pulse: (!) 55  Resp: 18  Temp: 98 F (36.7 C)  SpO2: 98%   Filed Weights   09/01/23 0910  Weight: 236 lb 12.8 oz (107.4 kg)

## 2023-09-02 ENCOUNTER — Telehealth: Payer: Self-pay | Admitting: Hematology and Oncology

## 2023-09-02 NOTE — Telephone Encounter (Signed)
 Spoke with patient confirming upcoming appointment

## 2023-12-27 ENCOUNTER — Telehealth: Payer: Self-pay | Admitting: *Deleted

## 2023-12-27 NOTE — Telephone Encounter (Signed)
 This RN returned pt's call left late yesterday-  Brittany Middleton states she has ongoing GI issues that she is not sure if she should follow up with GI or Lonn my concern is more my issues and recurrence of my cancer so I wanted Dr Lonn to review first.  Brittany Middleton states she was start on Ozempic and approximately 1 month ago she titrated up to the 1 mg dose with onset of severe abd discomfort. She states she feels her hiatal hernia has been irritated and now causing issues with swallowing as well as post eating GERD.  She is on protonix  ( has been for 8 years)  She held the ozempic post above occurring with mild improvement but symptoms still continuing.  She states she is going out of town later this month and would like to know Dr Buzz opinion if she needs to be seen by her if above is cancer concern or should she proceed to follow up with GI.  Informed her this note would be given to MD for review for following recommendations.

## 2023-12-27 NOTE — Telephone Encounter (Signed)
 This RN called pt and informed of Dr Lonn review and recommendation. Pt appreciated discussion and states she will follow up with GI - as well as let us  know if symptoms do not subside.   [name], MDo Me  [name], LPN (Selected Message)    12/27/23 12:41 PM Note The Gi side-effects from ozempic is well known.I imaging those side-effects to linger for a while even after stopping (the dosage of ozempic is once a week so I expect the effect to linger for 1-2 months)   She needs to consult with her PCP and GI physicians   Gastrointestinal: Abdominal pain (Diabetes, 5.7% to 11%; weight management, 15% to 20%), Constipation (Diabetes, 3.1% to 6%; weight management, 6% to 24%), Diarrhea (Diabetes, 8.5% to 10%; weight management, 22% to 30%), Nausea (Diabetes, 11% to 20.3%; weight management, 42% to 44%), Vomiting (Diabetes, 5% to 9.2% ; weight management, 24% to 36%)      12/27/23 12:13 PM You routed this conversation to [name], MD     12/27/23 12:12 PM Note This RN returned pt's call left late yesterday-   Brittany Middleton states she has ongoing GI issues that she is not sure if she should follow up with GI or Lonn my concern is more my issues and recurrence of my cancer so I wanted Dr Lonn to review first.   Brittany Middleton states she was start on Ozempic and approximately 1 month ago she titrated up to the 1 mg dose with onset of severe abd discomfort. She states she feels her hiatal hernia has been irritated and now causing issues with swallowing as well as post eating GERD.   She is on protonix  ( has been for 8 years)   She held the ozempic post above occurring with mild improvement but symptoms still continuing.   She states she is going out of town later this month and would like to know Dr Buzz opinion if she needs to be seen by her if above is cancer concern or should she proceed to follow up with GI.   Informed her this note would be given to MD for review for following  recommendations.         Recent Patient Communication   Last Update Description Specialty    Today -- Oncology    [facility] Janine Kins P Closed

## 2023-12-27 NOTE — Telephone Encounter (Signed)
 The Gi side-effects from ozempic is well known.I imaging those side-effects to linger for a while even after stopping (the dosage of ozempic is once a week so I expect the effect to linger for 1-2 months)  She needs to consult with her PCP and GI physicians  Gastrointestinal: Abdominal pain (Diabetes, 5.7% to 11%; weight management, 15% to 20%), Constipation (Diabetes, 3.1% to 6%; weight management, 6% to 24%), Diarrhea (Diabetes, 8.5% to 10%; weight management, 22% to 30%), Nausea (Diabetes, 11% to 20.3%; weight management, 42% to 44%), Vomiting (Diabetes, 5% to 9.2% ; weight management, 24% to 36%)

## 2024-01-04 ENCOUNTER — Other Ambulatory Visit: Payer: Self-pay

## 2024-01-04 DIAGNOSIS — K449 Diaphragmatic hernia without obstruction or gangrene: Secondary | ICD-10-CM

## 2024-01-04 DIAGNOSIS — R1010 Upper abdominal pain, unspecified: Secondary | ICD-10-CM

## 2024-01-26 ENCOUNTER — Other Ambulatory Visit

## 2024-01-31 ENCOUNTER — Other Ambulatory Visit

## 2024-02-02 ENCOUNTER — Ambulatory Visit
Admission: RE | Admit: 2024-02-02 | Discharge: 2024-02-02 | Disposition: A | Discharge status: Home / Self Care | Source: Ambulatory Visit

## 2024-02-02 DIAGNOSIS — R1010 Upper abdominal pain, unspecified: Secondary | ICD-10-CM

## 2024-02-02 DIAGNOSIS — K449 Diaphragmatic hernia without obstruction or gangrene: Secondary | ICD-10-CM

## 2024-02-21 ENCOUNTER — Inpatient Hospital Stay: Attending: Hematology and Oncology

## 2024-02-21 ENCOUNTER — Ambulatory Visit (HOSPITAL_COMMUNITY)
Admission: RE | Admit: 2024-02-21 | Discharge: 2024-02-21 | Disposition: A | Discharge status: Home / Self Care | Source: Ambulatory Visit | Attending: Hematology and Oncology | Admitting: Hematology and Oncology

## 2024-02-21 DIAGNOSIS — Z8542 Personal history of malignant neoplasm of other parts of uterus: Secondary | ICD-10-CM | POA: Insufficient documentation

## 2024-02-21 DIAGNOSIS — C5701 Malignant neoplasm of right fallopian tube: Secondary | ICD-10-CM | POA: Insufficient documentation

## 2024-02-21 DIAGNOSIS — K5900 Constipation, unspecified: Secondary | ICD-10-CM | POA: Diagnosis not present

## 2024-02-21 DIAGNOSIS — Z9221 Personal history of antineoplastic chemotherapy: Secondary | ICD-10-CM | POA: Insufficient documentation

## 2024-02-21 LAB — CBC WITH DIFFERENTIAL/PLATELET
Abs Immature Granulocytes: 0.01 K/uL (ref 0.00–0.07)
Basophils Absolute: 0 K/uL (ref 0.0–0.1)
Basophils Relative: 1 %
Eosinophils Absolute: 0.2 K/uL (ref 0.0–0.5)
Eosinophils Relative: 4 %
HCT: 40.7 % (ref 36.0–46.0)
Hemoglobin: 13.2 g/dL (ref 12.0–15.0)
Immature Granulocytes: 0 %
Lymphocytes Relative: 23 %
Lymphs Abs: 0.9 K/uL (ref 0.7–4.0)
MCH: 30.2 pg (ref 26.0–34.0)
MCHC: 32.4 g/dL (ref 30.0–36.0)
MCV: 93.1 fL (ref 80.0–100.0)
Monocytes Absolute: 0.3 K/uL (ref 0.1–1.0)
Monocytes Relative: 7 %
Neutro Abs: 2.7 K/uL (ref 1.7–7.7)
Neutrophils Relative %: 65 %
Platelets: 221 K/uL (ref 150–400)
RBC: 4.37 MIL/uL (ref 3.87–5.11)
RDW: 13.2 % (ref 11.5–15.5)
WBC: 4.2 K/uL (ref 4.0–10.5)
nRBC: 0 % (ref 0.0–0.2)

## 2024-02-21 LAB — COMPREHENSIVE METABOLIC PANEL WITH GFR
ALT: 16 U/L (ref 0–44)
AST: 20 U/L (ref 15–41)
Albumin: 4.2 g/dL (ref 3.5–5.0)
Alkaline Phosphatase: 66 U/L (ref 38–126)
Anion gap: 5 (ref 5–15)
BUN: 13 mg/dL (ref 8–23)
CO2: 32 mmol/L (ref 22–32)
Calcium: 9.3 mg/dL (ref 8.9–10.3)
Chloride: 105 mmol/L (ref 98–111)
Creatinine, Ser: 0.6 mg/dL (ref 0.44–1.00)
GFR, Estimated: >60 mL/min (ref >60)
Glucose, Bld: 99 mg/dL (ref 70–99)
Potassium: 4.5 mmol/L (ref 3.5–5.1)
Sodium: 142 mmol/L (ref 135–145)
Total Bilirubin: 0.9 mg/dL (ref 0.0–1.2)
Total Protein: 6.9 g/dL (ref 6.5–8.1)

## 2024-02-21 MED ORDER — IOHEXOL 300 MG/ML  SOLN
100.0000 mL | Freq: Once | INTRAMUSCULAR | Status: AC | PRN
  Administered 2024-02-21: 100 mL via INTRAVENOUS

## 2024-02-21 MED ORDER — IOHEXOL 9 MG/ML PO SOLN
1000.0000 mL | ORAL | Status: AC
  Administered 2024-02-21: 1000 mL via ORAL

## 2024-02-22 LAB — CA 125: Cancer Antigen (CA) 125: 3.8 U/mL (ref 0.0–38.1)

## 2024-02-28 ENCOUNTER — Inpatient Hospital Stay (HOSPITAL_BASED_OUTPATIENT_CLINIC_OR_DEPARTMENT_OTHER): Admitting: Hematology and Oncology

## 2024-02-28 ENCOUNTER — Encounter: Payer: Self-pay | Admitting: Hematology and Oncology

## 2024-02-28 VITALS — BP 142/64 | HR 58 | Temp 97.8°F | Resp 18 | Ht 65.0 in | Wt 239.4 lb

## 2024-02-28 DIAGNOSIS — C5701 Malignant neoplasm of right fallopian tube: Secondary | ICD-10-CM | POA: Diagnosis not present

## 2024-02-28 DIAGNOSIS — K5909 Other constipation: Secondary | ICD-10-CM | POA: Diagnosis not present

## 2024-02-28 NOTE — Progress Notes (Signed)
 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  She was prescribed neoadjuvant chemotherapy followed by interval debulking surgery and completed adjuvant treatment by December 2022 Pathology: High grade serous, BRCA1/2 negative testing  She is on active surveillance She has no signs or symptoms to suggest cancer recurrence We discussed tumor marker monitoring I reviewed CT imaging from September 2025 which showed no evidence of disease I am planning to space out her future appointment to 9 months Other constipation She has chronic constipation, exacerbated by recent medication We discussed importance of regular laxatives  No orders of the defined types were placed in this encounter.    Brittany Bedford, MD  INTERVAL HISTORY: she returns for surveillance follow-up for fallopian tube cancer The patient was started on GLP-1 treatment several months ago, causing severe constipation She is able to get her bowel habits regulated with additional laxatives She continues to have intermittent constipation I reviewed her blood work and imaging studies and we discussed future follow-up  PHYSICAL EXAMINATION: ECOG PERFORMANCE STATUS: 0 - Asymptomatic  Vitals:   02/28/24 1134  BP: (!) 142/64  Pulse: (!) 58  Resp: 18  Temp: 97.8 F (36.6 C)  SpO2: 98%   Filed Weights   02/28/24 1134  Weight: 239 lb 6.4 oz (108.6 kg)    Relevant data reviewed during this visit included CBC, CMP, CA125, CT imaging from September 2025

## 2024-02-28 NOTE — Assessment & Plan Note (Addendum)
 She has chronic constipation, exacerbated by recent medication We discussed importance of regular laxatives

## 2024-02-28 NOTE — Assessment & Plan Note (Addendum)
 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  She was prescribed neoadjuvant chemotherapy followed by interval debulking surgery and completed adjuvant treatment by December 2022 Pathology: High grade serous, BRCA1/2 negative testing  She is on active surveillance She has no signs or symptoms to suggest cancer recurrence We discussed tumor marker monitoring I reviewed CT imaging from September 2025 which showed no evidence of disease I am planning to space out her future appointment to 9 months

## 2024-03-29 ENCOUNTER — Other Ambulatory Visit: Payer: Self-pay | Admitting: Family Medicine

## 2024-03-29 DIAGNOSIS — Z1231 Encounter for screening mammogram for malignant neoplasm of breast: Secondary | ICD-10-CM

## 2024-03-31 ENCOUNTER — Encounter (HOSPITAL_COMMUNITY): Payer: Self-pay | Admitting: Radiology

## 2024-03-31 ENCOUNTER — Inpatient Hospital Stay (HOSPITAL_COMMUNITY)
Admission: EM | Admit: 2024-03-31 | Discharge: 2024-04-04 | DRG: 378 | Disposition: A | Discharge status: Home / Self Care | Attending: Internal Medicine | Admitting: Internal Medicine

## 2024-03-31 ENCOUNTER — Other Ambulatory Visit: Payer: Self-pay

## 2024-03-31 DIAGNOSIS — J4520 Mild intermittent asthma, uncomplicated: Secondary | ICD-10-CM | POA: Diagnosis present

## 2024-03-31 DIAGNOSIS — E785 Hyperlipidemia, unspecified: Secondary | ICD-10-CM | POA: Diagnosis present

## 2024-03-31 DIAGNOSIS — Z8249 Family history of ischemic heart disease and other diseases of the circulatory system: Secondary | ICD-10-CM

## 2024-03-31 DIAGNOSIS — Z79899 Other long term (current) drug therapy: Secondary | ICD-10-CM

## 2024-03-31 DIAGNOSIS — Z6838 Body mass index (BMI) 38.0-38.9, adult: Secondary | ICD-10-CM

## 2024-03-31 DIAGNOSIS — I2510 Atherosclerotic heart disease of native coronary artery without angina pectoris: Secondary | ICD-10-CM | POA: Diagnosis present

## 2024-03-31 DIAGNOSIS — D72819 Decreased white blood cell count, unspecified: Secondary | ICD-10-CM | POA: Diagnosis present

## 2024-03-31 DIAGNOSIS — J45909 Unspecified asthma, uncomplicated: Secondary | ICD-10-CM | POA: Diagnosis present

## 2024-03-31 DIAGNOSIS — K219 Gastro-esophageal reflux disease without esophagitis: Secondary | ICD-10-CM | POA: Diagnosis present

## 2024-03-31 DIAGNOSIS — K760 Fatty (change of) liver, not elsewhere classified: Secondary | ICD-10-CM | POA: Diagnosis present

## 2024-03-31 DIAGNOSIS — Z8619 Personal history of other infectious and parasitic diseases: Secondary | ICD-10-CM

## 2024-03-31 DIAGNOSIS — K5731 Diverticulosis of large intestine without perforation or abscess with bleeding: Principal | ICD-10-CM | POA: Diagnosis present

## 2024-03-31 DIAGNOSIS — Z79890 Hormone replacement therapy (postmenopausal): Secondary | ICD-10-CM

## 2024-03-31 DIAGNOSIS — K921 Melena: Principal | ICD-10-CM

## 2024-03-31 DIAGNOSIS — Z87440 Personal history of urinary (tract) infections: Secondary | ICD-10-CM

## 2024-03-31 DIAGNOSIS — E66812 Obesity, class 2: Secondary | ICD-10-CM | POA: Diagnosis present

## 2024-03-31 DIAGNOSIS — I10 Essential (primary) hypertension: Secondary | ICD-10-CM | POA: Diagnosis present

## 2024-03-31 DIAGNOSIS — K648 Other hemorrhoids: Secondary | ICD-10-CM | POA: Diagnosis present

## 2024-03-31 DIAGNOSIS — K644 Residual hemorrhoidal skin tags: Secondary | ICD-10-CM | POA: Diagnosis present

## 2024-03-31 DIAGNOSIS — K2970 Gastritis, unspecified, without bleeding: Secondary | ICD-10-CM | POA: Diagnosis present

## 2024-03-31 DIAGNOSIS — M1990 Unspecified osteoarthritis, unspecified site: Secondary | ICD-10-CM | POA: Diagnosis present

## 2024-03-31 DIAGNOSIS — D123 Benign neoplasm of transverse colon: Secondary | ICD-10-CM | POA: Diagnosis present

## 2024-03-31 DIAGNOSIS — K922 Gastrointestinal hemorrhage, unspecified: Secondary | ICD-10-CM | POA: Diagnosis present

## 2024-03-31 DIAGNOSIS — E7800 Pure hypercholesterolemia, unspecified: Secondary | ICD-10-CM | POA: Diagnosis present

## 2024-03-31 DIAGNOSIS — Z7982 Long term (current) use of aspirin: Secondary | ICD-10-CM

## 2024-03-31 DIAGNOSIS — R609 Edema, unspecified: Secondary | ICD-10-CM | POA: Diagnosis present

## 2024-03-31 DIAGNOSIS — Z7984 Long term (current) use of oral hypoglycemic drugs: Secondary | ICD-10-CM

## 2024-03-31 DIAGNOSIS — Z8543 Personal history of malignant neoplasm of ovary: Secondary | ICD-10-CM

## 2024-03-31 DIAGNOSIS — Z8701 Personal history of pneumonia (recurrent): Secondary | ICD-10-CM

## 2024-03-31 DIAGNOSIS — K5909 Other constipation: Secondary | ICD-10-CM | POA: Diagnosis present

## 2024-03-31 DIAGNOSIS — K449 Diaphragmatic hernia without obstruction or gangrene: Secondary | ICD-10-CM | POA: Diagnosis present

## 2024-03-31 DIAGNOSIS — D62 Acute posthemorrhagic anemia: Secondary | ICD-10-CM | POA: Diagnosis present

## 2024-03-31 DIAGNOSIS — Z9842 Cataract extraction status, left eye: Secondary | ICD-10-CM

## 2024-03-31 DIAGNOSIS — Z881 Allergy status to other antibiotic agents status: Secondary | ICD-10-CM

## 2024-03-31 DIAGNOSIS — G4733 Obstructive sleep apnea (adult) (pediatric): Secondary | ICD-10-CM | POA: Diagnosis present

## 2024-03-31 DIAGNOSIS — Z7902 Long term (current) use of antithrombotics/antiplatelets: Secondary | ICD-10-CM

## 2024-03-31 DIAGNOSIS — Z885 Allergy status to narcotic agent status: Secondary | ICD-10-CM

## 2024-03-31 DIAGNOSIS — Z9841 Cataract extraction status, right eye: Secondary | ICD-10-CM

## 2024-03-31 DIAGNOSIS — Z854A Personal history of malignant neoplasm of fallopian tube(s): Secondary | ICD-10-CM

## 2024-03-31 DIAGNOSIS — Z8542 Personal history of malignant neoplasm of other parts of uterus: Secondary | ICD-10-CM

## 2024-03-31 DIAGNOSIS — Z7985 Long-term (current) use of injectable non-insulin antidiabetic drugs: Secondary | ICD-10-CM

## 2024-03-31 NOTE — ED Triage Notes (Signed)
 She started Linzess 3 days ago and tonight she has had multiple episodes of bloody stool. It is now dark deep red with tissue clots that she is sure is coming from her rectum. She endorses having 5 episodes like this since 8pm. She denies dizziness. She is on Plavix  and ASA daily. She is also on a PPI.

## 2024-03-31 NOTE — ED Provider Notes (Signed)
 [facility] AT [facility] Provider Note   CSN: [id number] Arrival date & time: 03/31/24  2223     Patient presents with: Rectal Bleeding   Brittany Middleton is a 73 y.o. female.  {Add pertinent medical, surgical, social history, OB history to YEP:67052} The history is provided by the patient.  Rectal Bleeding Brittany Middleton is a 73 y.o. female who presents to the Emergency Department complaining of *** Started linzess 3 days ago, Hx/o constipation since ovarian ca three years ago. Normal bm tonight.  10 min later stool with blood, 5 min later blood with little stool.  Deep red with clot. Last bm at 11. 6 episodes since 8p.  Mild tenderness central abdomen. No fever, lightheaded, nausea Plavix  81mg  asa Outlaw.  Hx/o diverticulosis. As well as internal and external hemorrhoids.       Prior to Admission medications   Medication Sig Start Date End Date Taking? Authorizing Provider  acetaminophen  (TYLENOL ) 500 MG tablet Take 1,000 mg by mouth every 6 (six) hours as needed for mild pain, moderate pain, fever or headache.    [provider]  albuterol  (VENTOLIN  HFA) 108 (90 Base) MCG/ACT inhaler Inhale 2 puffs into the lungs every 6 (six) hours as needed for wheezing or shortness of breath. 07/26/23   Kara Dorn NOVAK, MD  atorvastatin  (LIPITOR) 40 MG tablet Take 1 tablet (40 mg total) by mouth daily. 07/05/22   Swinyer, Rosaline HERO, NP  Biotin 1000 MCG CHEW     [provider]  budesonide -formoterol  (BREYNA ) 80-4.5 MCG/ACT inhaler Inhale 2 puffs into the lungs 2 (two) times daily as needed. 07/26/23   Kara Dorn NOVAK, MD  cholecalciferol  (VITAMIN D3) 25 MCG (1000 UNIT) tablet Take 1,000 Units by mouth daily.    [provider]  clopidogrel  (PLAVIX ) 75 MG tablet Take 75 mg by mouth daily.    [provider]  co-enzyme Q-10 30 MG capsule Take by mouth. 09/17/21   [provider]  estradiol  (ESTRACE  VAGINAL)  0.1 MG/GM vaginal cream Place 1 Applicatorful vaginally at bedtime. 01/28/22   [name], MD  hydrocortisone 2.5 % cream Apply topically. 10/14/21   [provider]  loratadine (CLARITIN) 10 MG tablet Take 10 mg by mouth daily.    [provider]  losartan  (COZAAR ) 50 MG tablet Take 50 mg by mouth daily. 10/02/21   [provider]  Melatonin 5 MG CHEW Chew 20 mg by mouth at bedtime.    [provider]  metFORMIN (GLUCOPHAGE) 500 MG tablet Take 500 mg by mouth 2 (two) times daily. 10/03/21   [provider]  Omega 3 1000 MG CAPS Take 1 capsule by mouth daily.    [provider]  omeprazole  (PRILOSEC) 40 MG capsule Take 40 mg by mouth every morning.    [provider]  OZEMPIC, 0.25 OR 0.5 MG/DOSE, 2 MG/3ML SOPN INJECT 0.25 MG SUBCTANEOUSLY ONCE A WEEK FOR 30 DAYS 08/23/23   [provider]  polyethylene glycol (MIRALAX  / GLYCOLAX ) 17 g packet Take 8.5 g by mouth 2 (two) times daily.    [provider]  Probiotic Product (PROBIOTIC BLEND PO) Take by mouth.    [provider]  UNABLE TO FIND Take 1 capsule by mouth daily. Med Name: [place] Geographical information systems officer, Historical, MD    Allergies: Macrobid  [nitrofurantoin ], Ciprofloxacin, Codeine, and Azithromycin    Review of Systems  Gastrointestinal:  Positive for hematochezia.  All other systems  reviewed and are negative.   Updated Vital Signs BP (!) 178/88 (BP Location: Left Arm)   Pulse 73   Temp 99.2 F (37.3 C) (Oral)   Resp 20   Ht 5' 5 (1.651 m)   Wt 104.3 kg   SpO2 98%   BMI 38.27 kg/m   Physical Exam Vitals and nursing note reviewed.  Constitutional:      Appearance: She is well-developed.  HENT:     Head: Normocephalic and atraumatic.  Cardiovascular:     Rate and Rhythm: Normal rate and regular rhythm.  Pulmonary:     Effort: Pulmonary effort is normal. No respiratory distress.  Abdominal:     Palpations: Abdomen is soft.      Tenderness: There is no guarding or rebound.     Comments: Mild central abdominal tenderness  Musculoskeletal:        General: No tenderness.  Skin:    General: Skin is warm and dry.  Neurological:     Mental Status: She is alert and oriented to person, place, and time.  Psychiatric:        Behavior: Behavior normal.     (all labs ordered are listed, but only abnormal results are displayed) Labs Reviewed  COMPREHENSIVE METABOLIC PANEL WITH GFR  CBC  POC OCCULT BLOOD, ED  TYPE AND SCREEN    EKG: None  Radiology: No results found.  {Document cardiac monitor, telemetry assessment procedure when appropriate:32947} Procedures   Medications Ordered in the ED - No data to display    {Click here for ABCD2, HEART and other calculators REFRESH Note before signing:1}                              Medical Decision Making Amount and/or Complexity of Data Reviewed Labs: ordered.   ***  {Document critical care time when appropriate  Document review of labs and clinical decision tools ie CHADS2VASC2, etc  Document your independent review of radiology images and any outside records  Document your discussion with family members, caretakers and with consultants  Document social determinants of health affecting pt's care  Document your decision making why or why not admission, treatments were needed:32947:::1}   Final diagnoses:  None    ED Discharge Orders     None

## 2024-04-01 ENCOUNTER — Emergency Department (HOSPITAL_COMMUNITY)

## 2024-04-01 DIAGNOSIS — I10 Essential (primary) hypertension: Secondary | ICD-10-CM | POA: Diagnosis present

## 2024-04-01 DIAGNOSIS — K922 Gastrointestinal hemorrhage, unspecified: Secondary | ICD-10-CM | POA: Diagnosis not present

## 2024-04-01 DIAGNOSIS — J45909 Unspecified asthma, uncomplicated: Secondary | ICD-10-CM | POA: Diagnosis not present

## 2024-04-01 DIAGNOSIS — K5909 Other constipation: Secondary | ICD-10-CM | POA: Diagnosis present

## 2024-04-01 DIAGNOSIS — K760 Fatty (change of) liver, not elsewhere classified: Secondary | ICD-10-CM | POA: Diagnosis present

## 2024-04-01 DIAGNOSIS — Z6838 Body mass index (BMI) 38.0-38.9, adult: Secondary | ICD-10-CM | POA: Diagnosis not present

## 2024-04-01 DIAGNOSIS — Z7902 Long term (current) use of antithrombotics/antiplatelets: Secondary | ICD-10-CM | POA: Diagnosis not present

## 2024-04-01 DIAGNOSIS — Z7985 Long-term (current) use of injectable non-insulin antidiabetic drugs: Secondary | ICD-10-CM | POA: Diagnosis not present

## 2024-04-01 DIAGNOSIS — E66812 Obesity, class 2: Secondary | ICD-10-CM | POA: Diagnosis present

## 2024-04-01 DIAGNOSIS — J4520 Mild intermittent asthma, uncomplicated: Secondary | ICD-10-CM | POA: Diagnosis present

## 2024-04-01 DIAGNOSIS — Z7982 Long term (current) use of aspirin: Secondary | ICD-10-CM | POA: Diagnosis not present

## 2024-04-01 DIAGNOSIS — E782 Mixed hyperlipidemia: Secondary | ICD-10-CM | POA: Diagnosis not present

## 2024-04-01 DIAGNOSIS — Z8249 Family history of ischemic heart disease and other diseases of the circulatory system: Secondary | ICD-10-CM | POA: Diagnosis not present

## 2024-04-01 DIAGNOSIS — D62 Acute posthemorrhagic anemia: Secondary | ICD-10-CM | POA: Diagnosis present

## 2024-04-01 DIAGNOSIS — Z8543 Personal history of malignant neoplasm of ovary: Secondary | ICD-10-CM | POA: Diagnosis not present

## 2024-04-01 DIAGNOSIS — K449 Diaphragmatic hernia without obstruction or gangrene: Secondary | ICD-10-CM | POA: Diagnosis present

## 2024-04-01 DIAGNOSIS — K2970 Gastritis, unspecified, without bleeding: Secondary | ICD-10-CM | POA: Diagnosis present

## 2024-04-01 DIAGNOSIS — K5730 Diverticulosis of large intestine without perforation or abscess without bleeding: Secondary | ICD-10-CM | POA: Diagnosis not present

## 2024-04-01 DIAGNOSIS — D72819 Decreased white blood cell count, unspecified: Secondary | ICD-10-CM | POA: Diagnosis present

## 2024-04-01 DIAGNOSIS — G4733 Obstructive sleep apnea (adult) (pediatric): Secondary | ICD-10-CM | POA: Diagnosis present

## 2024-04-01 DIAGNOSIS — K5731 Diverticulosis of large intestine without perforation or abscess with bleeding: Secondary | ICD-10-CM | POA: Diagnosis present

## 2024-04-01 DIAGNOSIS — Z854A Personal history of malignant neoplasm of fallopian tube(s): Secondary | ICD-10-CM | POA: Diagnosis not present

## 2024-04-01 DIAGNOSIS — K219 Gastro-esophageal reflux disease without esophagitis: Secondary | ICD-10-CM | POA: Diagnosis present

## 2024-04-01 DIAGNOSIS — Z7984 Long term (current) use of oral hypoglycemic drugs: Secondary | ICD-10-CM | POA: Diagnosis not present

## 2024-04-01 DIAGNOSIS — K921 Melena: Secondary | ICD-10-CM | POA: Diagnosis present

## 2024-04-01 DIAGNOSIS — I2510 Atherosclerotic heart disease of native coronary artery without angina pectoris: Secondary | ICD-10-CM | POA: Diagnosis present

## 2024-04-01 DIAGNOSIS — K648 Other hemorrhoids: Secondary | ICD-10-CM | POA: Diagnosis present

## 2024-04-01 DIAGNOSIS — K644 Residual hemorrhoidal skin tags: Secondary | ICD-10-CM | POA: Diagnosis present

## 2024-04-01 DIAGNOSIS — E7800 Pure hypercholesterolemia, unspecified: Secondary | ICD-10-CM | POA: Diagnosis present

## 2024-04-01 LAB — CBC
HCT: 38 % (ref 36.0–46.0)
Hemoglobin: 12.1 g/dL (ref 12.0–15.0)
MCH: 30.3 pg (ref 26.0–34.0)
MCHC: 31.8 g/dL (ref 30.0–36.0)
MCV: 95.2 fL (ref 80.0–100.0)
Platelets: 212 K/uL (ref 150–400)
RBC: 3.99 MIL/uL (ref 3.87–5.11)
RDW: 13.4 % (ref 11.5–15.5)
WBC: 5.1 K/uL (ref 4.0–10.5)
nRBC: 0 % (ref 0.0–0.2)

## 2024-04-01 LAB — COMPREHENSIVE METABOLIC PANEL WITH GFR
ALT: 18 U/L (ref 0–44)
AST: 24 U/L (ref 15–41)
Albumin: 4.2 g/dL (ref 3.5–5.0)
Alkaline Phosphatase: 71 U/L (ref 38–126)
Anion gap: 11 (ref 5–15)
BUN: 14 mg/dL (ref 8–23)
CO2: 28 mmol/L (ref 22–32)
Calcium: 9.4 mg/dL (ref 8.9–10.3)
Chloride: 103 mmol/L (ref 98–111)
Creatinine, Ser: 0.59 mg/dL (ref 0.44–1.00)
GFR, Estimated: >60 mL/min (ref >60)
Glucose, Bld: 94 mg/dL (ref 70–99)
Potassium: 3.8 mmol/L (ref 3.5–5.1)
Sodium: 142 mmol/L (ref 135–145)
Total Bilirubin: 0.5 mg/dL (ref 0.0–1.2)
Total Protein: 6.5 g/dL (ref 6.5–8.1)

## 2024-04-01 LAB — TYPE AND SCREEN
ABO/RH(D): B NEG
Antibody Screen: NEGATIVE

## 2024-04-01 LAB — HEMOGLOBIN AND HEMATOCRIT, BLOOD
HCT: 31.9 % — ABNORMAL LOW (ref 36.0–46.0)
HCT: 31.5 % — ABNORMAL LOW (ref 36.0–46.0)
HCT: 32.2 % — ABNORMAL LOW (ref 36.0–46.0)
HCT: 28.5 % — ABNORMAL LOW (ref 36.0–46.0)
HCT: 29.2 % — ABNORMAL LOW (ref 36.0–46.0)
Hemoglobin: 10 g/dL — ABNORMAL LOW (ref 12.0–15.0)
Hemoglobin: 9.8 g/dL — ABNORMAL LOW (ref 12.0–15.0)
Hemoglobin: 10.2 g/dL — ABNORMAL LOW (ref 12.0–15.0)
Hemoglobin: 8.8 g/dL — ABNORMAL LOW (ref 12.0–15.0)
Hemoglobin: 9.1 g/dL — ABNORMAL LOW (ref 12.0–15.0)

## 2024-04-01 LAB — HEMOGLOBIN A1C
Hgb A1c MFr Bld: 5.3 % (ref 4.8–5.6)
Mean Plasma Glucose: 105.41 mg/dL

## 2024-04-01 LAB — MRSA NEXT GEN BY PCR, NASAL: MRSA by PCR Next Gen: NOT DETECTED

## 2024-04-01 MED ORDER — MELATONIN 5 MG PO TABS
10.0000 mg | ORAL_TABLET | Freq: Once | ORAL | Status: AC
  Administered 2024-04-01: 10 mg via ORAL
  Filled 2024-04-01: qty 2

## 2024-04-01 MED ORDER — LORATADINE 10 MG PO TABS
10.0000 mg | ORAL_TABLET | Freq: Every day | ORAL | Status: DC
Start: 2024-04-01 — End: 2024-04-04
  Administered 2024-04-01 – 2024-04-04 (×4): 10 mg via ORAL
  Filled 2024-04-01 (×4): qty 1

## 2024-04-01 MED ORDER — IOHEXOL 350 MG/ML SOLN
100.0000 mL | Freq: Once | INTRAVENOUS | Status: AC | PRN
  Administered 2024-04-01: 100 mL via INTRAVENOUS

## 2024-04-01 MED ORDER — ACETAMINOPHEN 325 MG PO TABS: 650.0000 mg | ORAL_TABLET | Freq: Four times a day (QID) | ORAL | Status: DC | PRN

## 2024-04-01 MED ORDER — ALBUTEROL SULFATE (2.5 MG/3ML) 0.083% IN NEBU
2.5000 mg | INHALATION_SOLUTION | Freq: Four times a day (QID) | RESPIRATORY_TRACT | Status: DC | PRN
Start: 2024-04-01 — End: 2024-04-04

## 2024-04-01 MED ORDER — INSULIN ASPART 100 UNIT/ML IJ SOLN
0.0000 [IU] | INTRAMUSCULAR | Status: DC
  Filled 2024-04-01: qty 0.09

## 2024-04-01 MED ORDER — PANTOPRAZOLE SODIUM 40 MG PO TBEC
40.0000 mg | DELAYED_RELEASE_TABLET | Freq: Every day | ORAL | Status: DC
  Administered 2024-04-01 – 2024-04-04 (×4): 40 mg via ORAL
  Filled 2024-04-01 (×4): qty 1

## 2024-04-01 MED ORDER — ATORVASTATIN CALCIUM 40 MG PO TABS
40.0000 mg | ORAL_TABLET | Freq: Every day | ORAL | Status: DC
  Administered 2024-04-01 – 2024-04-04 (×4): 40 mg via ORAL
  Filled 2024-04-01 (×4): qty 1

## 2024-04-01 MED ORDER — ACETAMINOPHEN 650 MG RE SUPP: 650.0000 mg | Freq: Four times a day (QID) | RECTAL | Status: DC | PRN

## 2024-04-01 MED ORDER — ORAL CARE MOUTH RINSE: 15.0000 mL | OROMUCOSAL | Status: DC | PRN

## 2024-04-01 MED ORDER — SODIUM CHLORIDE 0.9 % IV BOLUS
500.0000 mL | Freq: Once | INTRAVENOUS | Status: AC
  Administered 2024-04-01: 500 mL via INTRAVENOUS

## 2024-04-01 MED ORDER — EZETIMIBE 10 MG PO TABS
10.0000 mg | ORAL_TABLET | Freq: Every day | ORAL | Status: DC
  Administered 2024-04-01 – 2024-04-04 (×4): 10 mg via ORAL
  Filled 2024-04-01 (×4): qty 1

## 2024-04-01 MED ORDER — SODIUM CHLORIDE 0.9 % IV SOLN: INTRAVENOUS | Status: AC

## 2024-04-01 MED ORDER — CHLORHEXIDINE GLUCONATE CLOTH 2 % EX PADS
6.0000 | MEDICATED_PAD | Freq: Every day | CUTANEOUS | Status: DC
  Administered 2024-04-01 – 2024-04-04 (×4): 6 via TOPICAL

## 2024-04-01 NOTE — Progress Notes (Signed)
   04/01/24 2321  BiPAP/CPAP/SIPAP  BiPAP/CPAP/SIPAP Pt Type Adult  Reason BIPAP/CPAP not in use Other(comment) (patient does not want to wear our machine)  FiO2 (%) 21 %  BiPAP/CPAP /SiPAP Vitals  Pulse Rate 97  Resp (!) 25  SpO2 93 %  MEWS Score/Color  MEWS Score 1  MEWS Score Color Green

## 2024-04-01 NOTE — Plan of Care (Signed)
  Problem: Education: Goal: Ability to describe self-care measures that may prevent or decrease complications (Diabetes Survival Skills Education) will improve Outcome: Progressing   Problem: Coping: Goal: Ability to adjust to condition or change in health will improve Outcome: Progressing   Problem: Fluid Volume: Goal: Ability to maintain a balanced intake and output will improve Outcome: Progressing   Problem: Education: Goal: Knowledge of General Education information will improve Description: Including pain rating scale, medication(s)/side effects and non-pharmacologic comfort measures Outcome: Progressing   Problem: Health Behavior/Discharge Planning: Goal: Ability to manage health-related needs will improve Outcome: Progressing

## 2024-04-01 NOTE — ED Notes (Signed)
 Pt to ct
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
## 2024-04-01 NOTE — Progress Notes (Addendum)
 This is a very pleasant 73 y.o. 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  CT angiogram did not show any active extravasation or any target vessel.  Patient continues to have frequent hematochezia, had several episodes in the ED and then few episodes in the ICU now.  Hemoglobin dropped from 12.1 yesterday to 9.8 today, continue to monitor every 4 hours for now, closely monitor in the ICU setting for now.  Hold losartan .  Patient seen and examined in the ED.  She is having no complaints other than hematochezia.  No abdominal pain or nausea.  On examination abdomen is non tender and soft.  Rest of the physical exam is benign as well.  Total time spent 39 minutes.
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
## 2024-04-02 ENCOUNTER — Encounter: Payer: Self-pay | Admitting: Hematology and Oncology

## 2024-04-02 DIAGNOSIS — K922 Gastrointestinal hemorrhage, unspecified: Secondary | ICD-10-CM | POA: Diagnosis not present

## 2024-04-02 LAB — CBC WITH DIFFERENTIAL/PLATELET
Abs Immature Granulocytes: 0.01 K/uL (ref 0.00–0.07)
Basophils Absolute: 0 K/uL (ref 0.0–0.1)
Basophils Relative: 1 %
Eosinophils Absolute: 0.2 K/uL (ref 0.0–0.5)
Eosinophils Relative: 5 %
HCT: 25.5 % — ABNORMAL LOW (ref 36.0–46.0)
Hemoglobin: 7.9 g/dL — ABNORMAL LOW (ref 12.0–15.0)
Immature Granulocytes: 0 %
Lymphocytes Relative: 31 %
Lymphs Abs: 1.2 K/uL (ref 0.7–4.0)
MCH: 29.8 pg (ref 26.0–34.0)
MCHC: 31 g/dL (ref 30.0–36.0)
MCV: 96.2 fL (ref 80.0–100.0)
Monocytes Absolute: 0.4 K/uL (ref 0.1–1.0)
Monocytes Relative: 9 %
Neutro Abs: 2.1 K/uL (ref 1.7–7.7)
Neutrophils Relative %: 54 %
Platelets: 161 K/uL (ref 150–400)
RBC: 2.65 MIL/uL — ABNORMAL LOW (ref 3.87–5.11)
RDW: 13.5 % (ref 11.5–15.5)
WBC: 3.9 K/uL — ABNORMAL LOW (ref 4.0–10.5)
nRBC: 0 % (ref 0.0–0.2)

## 2024-04-02 LAB — BASIC METABOLIC PANEL WITH GFR
Anion gap: 6 (ref 5–15)
BUN: 10 mg/dL (ref 8–23)
CO2: 28 mmol/L (ref 22–32)
Calcium: 8.4 mg/dL — ABNORMAL LOW (ref 8.9–10.3)
Chloride: 109 mmol/L (ref 98–111)
Creatinine, Ser: 0.53 mg/dL (ref 0.44–1.00)
GFR, Estimated: >60 mL/min (ref >60)
Glucose, Bld: 97 mg/dL (ref 70–99)
Potassium: 4.5 mmol/L (ref 3.5–5.1)
Sodium: 142 mmol/L (ref 135–145)

## 2024-04-02 LAB — HEMOGLOBIN AND HEMATOCRIT, BLOOD
HCT: 26.8 % — ABNORMAL LOW (ref 36.0–46.0)
HCT: 25.8 % — ABNORMAL LOW (ref 36.0–46.0)
HCT: 25.1 % — ABNORMAL LOW (ref 36.0–46.0)
Hemoglobin: 8.4 g/dL — ABNORMAL LOW (ref 12.0–15.0)
Hemoglobin: 7.9 g/dL — ABNORMAL LOW (ref 12.0–15.0)
Hemoglobin: 8.1 g/dL — ABNORMAL LOW (ref 12.0–15.0)

## 2024-04-02 MED ORDER — MELATONIN 5 MG PO TABS
10.0000 mg | ORAL_TABLET | Freq: Every day | ORAL | Status: DC
  Administered 2024-04-02 – 2024-04-03 (×2): 10 mg via ORAL
  Filled 2024-04-02 (×2): qty 2

## 2024-04-02 MED ORDER — MELATONIN 5 MG PO TABS
10.0000 mg | ORAL_TABLET | Freq: Every evening | ORAL | Status: DC | PRN
Start: 2024-04-02 — End: 2024-04-04

## 2024-04-02 NOTE — Plan of Care (Signed)
  Problem: Education: Goal: Ability to describe self-care measures that may prevent or decrease complications (Diabetes Survival Skills Education) will improve Outcome: Progressing   Problem: Coping: Goal: Ability to adjust to condition or change in health will improve Outcome: Progressing   Problem: Fluid Volume: Goal: Ability to maintain a balanced intake and output will improve Outcome: Progressing   Problem: Health Behavior/Discharge Planning: Goal: Ability to manage health-related needs will improve Outcome: Progressing   Problem: Skin Integrity: Goal: Risk for impaired skin integrity will decrease Outcome: Progressing   Problem: Education: Goal: Knowledge of General Education information will improve Description: Including pain rating scale, medication(s)/side effects and non-pharmacologic comfort measures Outcome: Progressing   Problem: Health Behavior/Discharge Planning: Goal: Ability to manage health-related needs will improve Outcome: Progressing

## 2024-04-02 NOTE — Progress Notes (Signed)
 [facility] Gastroenterology Progress Note  Brittany Middleton 73 y.o. [birth year]  CC: GI bleed while on Plavix    Subjective: Patient seen and examined at bedside.  Feeling better.  Last episode of bleeding with blood clots this morning.  Denies abdominal pain, nausea or vomiting.  ROS : Afebrile, negative for chest pain and shortness of breath   Objective: Vital signs in last 24 hours: Vitals:   04/02/24 1000 04/02/24 1200  BP:    Pulse: 71   Resp: 19   Temp:  98.4 F (36.9 C)  SpO2: 93%     Physical Exam: Resting comfortably, not in acute distress.  No visible respiratory issues seen.  Abdominal exam benign.  Mood and affect normal.  Alert and oriented x 3.   Lab Results: Recent Labs    03/31/24 2338 04/02/24 0255  NA 142 142  K 3.8 4.5  CL 103 109  CO2 28 28  GLUCOSE 94 97  BUN 14 10  CREATININE 0.59 0.53  CALCIUM  9.4 8.4*   Recent Labs    03/31/24 2338  AST 24  ALT 18  ALKPHOS 71  BILITOT 0.5  PROT 6.5  ALBUMIN 4.2   Recent Labs    03/31/24 2338 04/01/24 0457 04/02/24 0255 04/02/24 0826  WBC 5.1  --  3.9*  --   NEUTROABS  --   --  2.1  --   HGB 12.1   < > 7.9* 8.4*  HCT 38.0   < > 25.5* 26.8*  MCV 95.2  --  96.2  --   PLT 212  --  161  --    < > = values in this interval not displayed.   No results for input(s): LABPROT, INR in the last 72 hours.    Assessment/Plan: -GI bleed likely lower GI bleed but has seen some black-colored stool likely blood clots.  CT angio negative for any active bleeding.  Most likely diverticular bleed. - Acute blood loss anemia.  Hemoglobin 8.4 - History of coronary artery disease.  Last dose of Plavix  on Saturday.  Recommendations ------------------------ - Advance diet to full liquid today. - Plan for preparing for colonoscopy tomorrow for tentative EGD and colonoscopy on Wednesday. - Monitor H&H.  Case discussed with RN at bedside.   [name] MD, FACP 04/02/2024, 12:49 PM  Contact #   [phone]

## 2024-04-02 NOTE — Plan of Care (Signed)
  Problem: Tissue Perfusion: Goal: Adequacy of tissue perfusion will improve Outcome: Progressing   Problem: Clinical Measurements: Goal: Ability to maintain clinical measurements within normal limits will improve Outcome: Progressing Goal: Will remain free from infection Outcome: Progressing Goal: Diagnostic test results will improve Outcome: Progressing   Problem: Activity: Goal: Risk for activity intolerance will decrease Outcome: Progressing

## 2024-04-02 NOTE — Progress Notes (Signed)
   04/02/24 2022  BiPAP/CPAP/SIPAP  BiPAP/CPAP/SIPAP Pt Type Adult  Reason BIPAP/CPAP not in use Non-compliant (Pt states she doesn't want to use a hospital cpap.)
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
## 2024-04-03 DIAGNOSIS — K922 Gastrointestinal hemorrhage, unspecified: Secondary | ICD-10-CM | POA: Diagnosis not present

## 2024-04-03 LAB — CBC WITH DIFFERENTIAL/PLATELET
Abs Immature Granulocytes: 0.01 K/uL (ref 0.00–0.07)
Basophils Absolute: 0 K/uL (ref 0.0–0.1)
Basophils Relative: 1 %
Eosinophils Absolute: 0.2 K/uL (ref 0.0–0.5)
Eosinophils Relative: 6 %
HCT: 25.1 % — ABNORMAL LOW (ref 36.0–46.0)
Hemoglobin: 7.9 g/dL — ABNORMAL LOW (ref 12.0–15.0)
Immature Granulocytes: 0 %
Lymphocytes Relative: 40 %
Lymphs Abs: 1.3 K/uL (ref 0.7–4.0)
MCH: 30 pg (ref 26.0–34.0)
MCHC: 31.5 g/dL (ref 30.0–36.0)
MCV: 95.4 fL (ref 80.0–100.0)
Monocytes Absolute: 0.3 K/uL (ref 0.1–1.0)
Monocytes Relative: 10 %
Neutro Abs: 1.4 K/uL — ABNORMAL LOW (ref 1.7–7.7)
Neutrophils Relative %: 43 %
Platelets: 175 K/uL (ref 150–400)
RBC: 2.63 MIL/uL — ABNORMAL LOW (ref 3.87–5.11)
RDW: 13.4 % (ref 11.5–15.5)
WBC: 3.2 K/uL — ABNORMAL LOW (ref 4.0–10.5)
nRBC: 0 % (ref 0.0–0.2)

## 2024-04-03 LAB — HEMOGLOBIN AND HEMATOCRIT, BLOOD
HCT: 25 % — ABNORMAL LOW (ref 36.0–46.0)
HCT: 25.5 % — ABNORMAL LOW (ref 36.0–46.0)
Hemoglobin: 7.9 g/dL — ABNORMAL LOW (ref 12.0–15.0)
Hemoglobin: 7.8 g/dL — ABNORMAL LOW (ref 12.0–15.0)

## 2024-04-03 MED ORDER — INFLUENZA VAC SPLIT HIGH-DOSE 0.5 ML IM SUSY: 0.5000 mL | PREFILLED_SYRINGE | INTRAMUSCULAR | Status: DC | PRN

## 2024-04-03 MED ORDER — PEG 3350-KCL-NA BICARB-NACL 420 G PO SOLR
2000.0000 mL | Freq: Once | ORAL | Status: AC
  Administered 2024-04-04: 2000 mL via ORAL
  Filled 2024-04-03: qty 4000

## 2024-04-03 MED ORDER — SODIUM CHLORIDE 0.9 % IV SOLN: INTRAVENOUS | Status: DC

## 2024-04-03 MED ORDER — PEG 3350-KCL-NA BICARB-NACL 420 G PO SOLR
2000.0000 mL | Freq: Once | ORAL | Status: AC
  Administered 2024-04-03: 2000 mL via ORAL
  Filled 2024-04-03: qty 4000

## 2024-04-03 NOTE — Plan of Care (Signed)
  Problem: Coping: Goal: Ability to adjust to condition or change in health will improve Outcome: Progressing   Problem: Fluid Volume: Goal: Ability to maintain a balanced intake and output will improve Outcome: Progressing   Problem: Health Behavior/Discharge Planning: Goal: Ability to identify and utilize available resources and services will improve Outcome: Progressing Goal: Ability to manage health-related needs will improve Outcome: Progressing   Problem: Activity: Goal: Risk for activity intolerance will decrease Outcome: Progressing   Problem: Elimination: Goal: Will not experience complications related to bowel motility Outcome: Progressing Goal: Will not experience complications related to urinary retention Outcome: Progressing

## 2024-04-03 NOTE — H&P (View-Only) (Signed)
 [facility] Progress Note  Brittany Middleton 73 y.o. Jul 05, 1950  CC: GI bleed while on Plavix    Subjective: Patient seen and examined at bedside.  Feeling better.  Denies any further bleeding.  ROS : Afebrile, negative for chest pain and shortness of breath   Objective: Vital signs in last 24 hours: Vitals:   04/03/24 0759 04/03/24 0805  BP:    Pulse:    Resp:    Temp: 98.7 F (37.1 C) 98.7 F (37.1 C)  SpO2:      Physical Exam: Resting comfortably, not in acute distress.  No visible respiratory issues seen.  Abdominal exam benign.  Mood and affect normal.  Alert and oriented x 3.   Lab Results: Recent Labs    03/31/24 2338 04/02/24 0255  NA 142 142  K 3.8 4.5  CL 103 109  CO2 28 28  GLUCOSE 94 97  BUN 14 10  CREATININE 0.59 0.53  CALCIUM  9.4 8.4*   Recent Labs    03/31/24 2338  AST 24  ALT 18  ALKPHOS 71  BILITOT 0.5  PROT 6.5  ALBUMIN 4.2   Recent Labs    04/02/24 0255 04/02/24 0826 04/02/24 2041 04/03/24 0303  WBC 3.9*  --   --  3.2*  NEUTROABS 2.1  --   --  1.4*  HGB 7.9*   < > 8.1* 7.9*  HCT 25.5*   < > 25.1* 25.1*  MCV 96.2  --   --  95.4  PLT 161  --   --  175   < > = values in this interval not displayed.   No results for input(s): LABPROT, INR in the last 72 hours.    Assessment/Plan: -GI bleed likely lower GI bleed but has seen some black-colored stool likely blood clots.  CT angio negative for any active bleeding.  Most likely diverticular bleed. - Acute blood loss anemia.  Hemoglobin 8.4 - History of coronary artery disease.  Last dose of Plavix  on Saturday.  Recommendations ------------------------ - Change diet to clear liquid.  Start colonoscopy prep this evening.  Split prep.  Half gallon today and half gallon tomorrow morning. - Plan for EGD and colonoscopy tomorrow.  Continue to hold Plavix .  Risks (bleeding, infection, bowel perforation that could require surgery, sedation-related changes in  cardiopulmonary systems), benefits (identification and possible treatment of source of symptoms, exclusion of certain causes of symptoms), and alternatives (watchful waiting, radiographic imaging studies, empiric medical treatment)  were explained to patient/family in detail and patient wishes to proceed.    [name] MD, FACP 04/03/2024, 8:27 AM  Contact #  [phone]

## 2024-04-03 NOTE — Progress Notes (Signed)
 [facility] Progress Note  Brittany Middleton 73 y.o. Jul 05, 1950  CC: GI bleed while on Plavix    Subjective: Patient seen and examined at bedside.  Feeling better.  Denies any further bleeding.  ROS : Afebrile, negative for chest pain and shortness of breath   Objective: Vital signs in last 24 hours: Vitals:   04/03/24 0759 04/03/24 0805  BP:    Pulse:    Resp:    Temp: 98.7 F (37.1 C) 98.7 F (37.1 C)  SpO2:      Physical Exam: Resting comfortably, not in acute distress.  No visible respiratory issues seen.  Abdominal exam benign.  Mood and affect normal.  Alert and oriented x 3.   Lab Results: Recent Labs    03/31/24 2338 04/02/24 0255  NA 142 142  K 3.8 4.5  CL 103 109  CO2 28 28  GLUCOSE 94 97  BUN 14 10  CREATININE 0.59 0.53  CALCIUM  9.4 8.4*   Recent Labs    03/31/24 2338  AST 24  ALT 18  ALKPHOS 71  BILITOT 0.5  PROT 6.5  ALBUMIN 4.2   Recent Labs    04/02/24 0255 04/02/24 0826 04/02/24 2041 04/03/24 0303  WBC 3.9*  --   --  3.2*  NEUTROABS 2.1  --   --  1.4*  HGB 7.9*   < > 8.1* 7.9*  HCT 25.5*   < > 25.1* 25.1*  MCV 96.2  --   --  95.4  PLT 161  --   --  175   < > = values in this interval not displayed.   No results for input(s): LABPROT, INR in the last 72 hours.    Assessment/Plan: -GI bleed likely lower GI bleed but has seen some black-colored stool likely blood clots.  CT angio negative for any active bleeding.  Most likely diverticular bleed. - Acute blood loss anemia.  Hemoglobin 8.4 - History of coronary artery disease.  Last dose of Plavix  on Saturday.  Recommendations ------------------------ - Change diet to clear liquid.  Start colonoscopy prep this evening.  Split prep.  Half gallon today and half gallon tomorrow morning. - Plan for EGD and colonoscopy tomorrow.  Continue to hold Plavix .  Risks (bleeding, infection, bowel perforation that could require surgery, sedation-related changes in  cardiopulmonary systems), benefits (identification and possible treatment of source of symptoms, exclusion of certain causes of symptoms), and alternatives (watchful waiting, radiographic imaging studies, empiric medical treatment)  were explained to patient/family in detail and patient wishes to proceed.    [name] MD, FACP 04/03/2024, 8:27 AM  Contact #  [phone]

## 2024-04-03 NOTE — Progress Notes (Signed)
   04/03/24 1411  TOC Brief Assessment  Insurance and Status Reviewed  Patient has primary care physician Yes  Home environment has been reviewed Single family home  Prior level of function: Independent  Prior/Current Home Services No current home services  Social Drivers of Health Review SDOH reviewed no interventions necessary  Readmission risk has been reviewed Yes  Transition of care needs no transition of care needs at this time

## 2024-04-03 NOTE — Plan of Care (Signed)
  Problem: Coping: Goal: Ability to adjust to condition or change in health will improve Outcome: Progressing   Problem: Health Behavior/Discharge Planning: Goal: Ability to identify and utilize available resources and services will improve Outcome: Progressing Goal: Ability to manage health-related needs will improve Outcome: Progressing   Problem: Nutritional: Goal: Maintenance of adequate nutrition will improve Outcome: Progressing Goal: Progress toward achieving an optimal weight will improve Outcome: Progressing

## 2024-04-03 NOTE — Progress Notes (Signed)
 PROGRESS NOTE    Brittany Middleton  FMW:[id number] DOB: 08-01-1950 DOA: 03/31/2024 PCP: Dyane Anthony RAMAN, FNP   Brief Narrative:  This is a very pleasant 73 y.o. 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  CT angiogram did not show any active extravasation or any target vessel.  GI consulted.  Assessment & Plan:   Principal Problem:   Acute lower GI bleeding Active Problems:   HTN (hypertension)   HLD (hyperlipidemia)   Acute blood loss anemia   Asthma  Acute blood loss anemia secondary to lower GI bleed/hematochezia/history of diverticulosis, POA: Patient continues to have hematochezia.  No abdominal pain.  CT angiogram negative for active extravasation or any target vessel.  Patient on Plavix  PTA, last dose on 03/31/2024.  Last episode of rectal bleeding 6 AM 04/19/2024.  GI on board, plan for EGD and colonoscopy tomorrow morning.  Hemoglobin 7.9.  Has not required transfusion so far.  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  She was prescribed neoadjuvant chemotherapy followed by interval debulking surgery and completed adjuvant treatment by December 2022.  Currently on active surveillance.  Outpatient oncology follow-up   History of nonobstructive CAD Stable.  Patient is not endorsing any anginal symptoms.   Hypertension Hold antihypertensives at this time in the setting of acute GI bleed.   Hyperlipidemia Continue atorvastatin  and  ezetimibe.   OSA Continue CPAP at night.   GERD Continue pantoprazole .  Chronic leukopenia: Could be due to chemotherapy.  Stable.  Monitor.   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  No history of diabetes/last hemoglobin A1c 5.6 in December 2022.  Hold home meds at this time and repeat A1c.  Weight loss and dietary modifications counseled.   Mild intermittent asthma Stable, no signs of acute exacerbation.  Continue loratadine and albuterol  PRN.  DVT prophylaxis: SCDs Start: 04/01/24 9344   Code Status: Full Code  Family Communication: Son  present at bedside.  Plan of care discussed with patient in length and he/she verbalized understanding and agreed with it.  Status is: Inpatient Remains inpatient appropriate because: Still with lower GI bleed.   Estimated body mass index is 37.92 kg/m as calculated from the following:   Height as of this encounter: 5' 5 (1.651 m).   Weight as of this encounter: 103.4 kg.    Nutritional Assessment: Body mass index is 37.92 kg/m.SABRA Seen by dietician.  I agree with the assessment and plan as outlined below: Nutrition Status:        . Skin Assessment: I have examined the patient's skin and I agree with the wound assessment as performed by the wound care RN as outlined below:    Consultants:  GI  Procedures:  None  Antimicrobials:  Anti-infectives (From admission, onward)    None         Subjective: Patient seen and examined, son at the bedside.  Patient has no complaints.  Objective: Vitals:   04/03/24 0000 04/03/24 0347 04/03/24 0416 04/03/24 0600  BP: (!) 119/55  130/71 (!) 135/48  Pulse:  93  69 61  Resp: (!) 22  20 17   Temp: 97.9 F (36.6 C) 98 F (36.7 C)    TempSrc: Oral Oral    SpO2: 98%  94% 94%  Weight: 103.4 kg     Height:       No intake or output data in the 24 hours ending 04/03/24 0748  Filed Weights   03/31/24 2308 04/01/24 0809 04/03/24 0000   Weight: 104.3 kg 105.6 kg 103.4 kg    Examination:  General exam: Appears calm and comfortable  Respiratory system: Clear to auscultation. Respiratory effort normal. Cardiovascular system: S1 & S2 heard, RRR. No JVD, murmurs, rubs, gallops or clicks. No pedal edema. Gastrointestinal system: Abdomen is nondistended, soft and nontender. No organomegaly or masses felt. Normal bowel sounds heard. Central nervous system: Alert and oriented. No focal neurological deficits. Extremities: Symmetric 5 x 5 power. Skin: No rashes, lesions or ulcers Psychiatry: Judgement and insight appear normal. Mood & affect appropriate.    Data Reviewed: I have personally reviewed following labs and imaging studies  CBC: Recent Labs  Lab 03/31/24 2338 04/01/24 0457 04/02/24 0255 04/02/24 0826 04/02/24 1319 04/02/24 2041 04/03/24 0303  WBC 5.1  --  3.9*  --   --   --  3.2*  NEUTROABS  --   --  2.1  --   --   --  1.4*  HGB 12.1   < > 7.9* 8.4* 7.9* 8.1* 7.9*  HCT 38.0   < > 25.5* 26.8* 25.8* 25.1* 25.1*  MCV 95.2  --  96.2  --   --   --  95.4  PLT 212  --  161  --   --   --  175   < > = values in this interval not displayed.   Basic Metabolic Panel: Recent Labs  Lab 03/31/24 2338 04/02/24 0255  NA 142 142  K 3.8 4.5  CL 103 109  CO2 28 28  GLUCOSE 94 97  BUN 14 10  CREATININE 0.59 0.53  CALCIUM  9.4 8.4*   GFR: Estimated Creatinine Clearance: 77 mL/min (by C-G formula based on SCr of 0.53 mg/dL). Liver Function Tests: Recent Labs  Lab 03/31/24 2338  AST 24  ALT 18  ALKPHOS 71  BILITOT 0.5  PROT 6.5  ALBUMIN 4.2   No results for input(s): LIPASE, AMYLASE in the last 168 hours. No results for input(s): AMMONIA in the last 168 hours. Coagulation Profile: No results for input(s): INR, PROTIME in the last 168 hours. Cardiac Enzymes: No results for input(s): CKTOTAL, CKMB, CKMBINDEX, TROPONINI in the last 168 hours. BNP (last 3 results) No results for input(s):  PROBNP in the last 8760 hours. HbA1C: Recent Labs    04/01/24 0839  HGBA1C 5.3   CBG: No results for input(s): GLUCAP in the last 168 hours. Lipid Profile: No results for input(s): CHOL, HDL, LDLCALC, TRIG, CHOLHDL, LDLDIRECT in the last 72 hours. Thyroid  Function Tests: No results for input(s): TSH, T4TOTAL, FREET4, T3FREE, THYROIDAB in the last 72 hours. Anemia Panel: No results for input(s): VITAMINB12, FOLATE, FERRITIN, TIBC, IRON, RETICCTPCT in the last 72 hours. Sepsis Labs: No results for input(s): PROCALCITON, LATICACIDVEN in the last 168 hours.  Recent Results (from the past 240 hours)  MRSA Next Gen by PCR, Nasal     Status: None   Collection Time: 04/01/24  8:45 AM   Specimen: Nasal Mucosa; Nasal Swab  Result Value Ref Range Status   MRSA by PCR Next Gen NOT  DETECTED NOT DETECTED Final    Comment: (NOTE) The GeneXpert MRSA Assay (FDA approved for NASAL specimens only), is one component of a comprehensive MRSA colonization surveillance program. It is not intended to diagnose MRSA infection nor to guide or monitor treatment for MRSA infections. Test performance is not FDA approved in patients less than 36 years old. Performed at [facility], [address] [place], [place] [address]      Radiology Studies: No results found.   Scheduled Meds:  atorvastatin   40 mg Oral Daily   Chlorhexidine  Gluconate Cloth  6 each Topical Daily   ezetimibe  10 mg Oral Daily   loratadine  10 mg Oral Daily   melatonin  10 mg Oral QHS   pantoprazole   40 mg Oral Daily   Continuous Infusions:   LOS: 2 days   Fredia Skeeter, MD [facility]  04/03/2024, 7:48 AM   *Please note that this is a verbal dictation therefore any spelling or grammatical errors are due to the Dragon Medical One system interpretation.  Please page via Amion and do not message via secure chat for urgent patient care matters. Secure  chat can be used for non urgent patient care matters.  How to contact the [facility] Attending or Consulting provider 7A - 7P or covering provider during after hours 7P -7A, for this patient?  Check the care team in [facility] and look for a) attending/consulting [facility] provider listed and b) the [facility] team listed. Page or secure chat 7A-7P. Log into [email or web address] and use [employer]'s universal password to access. If you do not have the password, please contact the hospital operator. Locate the [facility] provider you are looking for under [facility] and page to a number that you can be directly reached. If you still have difficulty reaching the provider, please page the [facility] (Director on Call) for the Hospitalists listed on amion for assistance.

## 2024-04-03 NOTE — Progress Notes (Signed)
   04/03/24 2000  BiPAP/CPAP/SIPAP  BiPAP/CPAP/SIPAP Pt Type Adult  Reason BIPAP/CPAP not in use Non-compliant

## 2024-04-04 ENCOUNTER — Encounter (HOSPITAL_COMMUNITY): Admission: EM | Disposition: A | Payer: Self-pay | Source: Home / Self Care | Attending: Family Medicine

## 2024-04-04 ENCOUNTER — Inpatient Hospital Stay (HOSPITAL_COMMUNITY): Admitting: Anesthesiology

## 2024-04-04 ENCOUNTER — Encounter (HOSPITAL_COMMUNITY): Payer: Self-pay | Admitting: Internal Medicine

## 2024-04-04 DIAGNOSIS — I2510 Atherosclerotic heart disease of native coronary artery without angina pectoris: Secondary | ICD-10-CM | POA: Diagnosis not present

## 2024-04-04 DIAGNOSIS — K922 Gastrointestinal hemorrhage, unspecified: Secondary | ICD-10-CM | POA: Diagnosis not present

## 2024-04-04 DIAGNOSIS — I10 Essential (primary) hypertension: Secondary | ICD-10-CM

## 2024-04-04 DIAGNOSIS — K2970 Gastritis, unspecified, without bleeding: Secondary | ICD-10-CM

## 2024-04-04 DIAGNOSIS — K5730 Diverticulosis of large intestine without perforation or abscess without bleeding: Secondary | ICD-10-CM

## 2024-04-04 DIAGNOSIS — E782 Mixed hyperlipidemia: Secondary | ICD-10-CM

## 2024-04-04 DIAGNOSIS — D62 Acute posthemorrhagic anemia: Secondary | ICD-10-CM | POA: Diagnosis not present

## 2024-04-04 DIAGNOSIS — J45909 Unspecified asthma, uncomplicated: Secondary | ICD-10-CM | POA: Diagnosis not present

## 2024-04-04 HISTORY — PX: ESOPHAGOGASTRODUODENOSCOPY: SHX5428

## 2024-04-04 HISTORY — PX: COLONOSCOPY: SHX5424

## 2024-04-04 LAB — COMPREHENSIVE METABOLIC PANEL WITH GFR
ALT: 15 U/L (ref 0–44)
AST: 23 U/L (ref 15–41)
Albumin: 3.6 g/dL (ref 3.5–5.0)
Alkaline Phosphatase: 48 U/L (ref 38–126)
Anion gap: 11 (ref 5–15)
BUN: 8 mg/dL (ref 8–23)
CO2: 27 mmol/L (ref 22–32)
Calcium: 8.9 mg/dL (ref 8.9–10.3)
Chloride: 105 mmol/L (ref 98–111)
Creatinine, Ser: 0.6 mg/dL (ref 0.44–1.00)
GFR, Estimated: >60 mL/min (ref >60)
Glucose, Bld: 92 mg/dL (ref 70–99)
Potassium: 3.9 mmol/L (ref 3.5–5.1)
Sodium: 143 mmol/L (ref 135–145)
Total Bilirubin: 0.5 mg/dL (ref 0.0–1.2)
Total Protein: 5.5 g/dL — ABNORMAL LOW (ref 6.5–8.1)

## 2024-04-04 LAB — CBC WITH DIFFERENTIAL/PLATELET
Abs Immature Granulocytes: 0.01 K/uL (ref 0.00–0.07)
Basophils Absolute: 0 K/uL (ref 0.0–0.1)
Basophils Relative: 1 %
Eosinophils Absolute: 0.2 K/uL (ref 0.0–0.5)
Eosinophils Relative: 6 %
HCT: 25.3 % — ABNORMAL LOW (ref 36.0–46.0)
Hemoglobin: 8 g/dL — ABNORMAL LOW (ref 12.0–15.0)
Immature Granulocytes: 0 %
Lymphocytes Relative: 33 %
Lymphs Abs: 1.3 K/uL (ref 0.7–4.0)
MCH: 30.1 pg (ref 26.0–34.0)
MCHC: 31.6 g/dL (ref 30.0–36.0)
MCV: 95.1 fL (ref 80.0–100.0)
Monocytes Absolute: 0.4 K/uL (ref 0.1–1.0)
Monocytes Relative: 10 %
Neutro Abs: 1.9 K/uL (ref 1.7–7.7)
Neutrophils Relative %: 50 %
Platelets: 196 K/uL (ref 150–400)
RBC: 2.66 MIL/uL — ABNORMAL LOW (ref 3.87–5.11)
RDW: 13.7 % (ref 11.5–15.5)
WBC: 3.8 K/uL — ABNORMAL LOW (ref 4.0–10.5)
nRBC: 0 % (ref 0.0–0.2)

## 2024-04-04 LAB — HEMOGLOBIN AND HEMATOCRIT, BLOOD
HCT: 24.6 % — ABNORMAL LOW (ref 36.0–46.0)
Hemoglobin: 7.7 g/dL — ABNORMAL LOW (ref 12.0–15.0)

## 2024-04-04 LAB — PHOSPHORUS: Phosphorus: 3.7 mg/dL (ref 2.5–4.6)

## 2024-04-04 LAB — MAGNESIUM: Magnesium: 1.8 mg/dL (ref 1.7–2.4)

## 2024-04-04 SURGERY — EGD (ESOPHAGOGASTRODUODENOSCOPY)
Anesthesia: Monitor Anesthesia Care

## 2024-04-04 MED ORDER — PROPOFOL 500 MG/50ML IV EMUL
INTRAVENOUS | Status: DC | PRN
  Administered 2024-04-04: 125 ug/kg/min via INTRAVENOUS

## 2024-04-04 MED ORDER — PROPOFOL 10 MG/ML IV BOLUS
INTRAVENOUS | Status: DC | PRN
  Administered 2024-04-04: 50 mg via INTRAVENOUS

## 2024-04-04 MED ORDER — LIDOCAINE 2% (20 MG/ML) 5 ML SYRINGE
INTRAMUSCULAR | Status: DC | PRN
  Administered 2024-04-04: 50 mg via INTRAVENOUS

## 2024-04-04 MED ORDER — MAGNESIUM SULFATE 2 GM/50ML IV SOLN
2.0000 g | Freq: Once | INTRAVENOUS | Status: AC
  Administered 2024-04-04: 2 g via INTRAVENOUS
  Filled 2024-04-04: qty 50

## 2024-04-04 NOTE — Interval H&P Note (Signed)
 History and Physical Interval Note:  04/04/2024 10:45 AM  Brittany Middleton  has presented today for surgery, with the diagnosis of GI bleed.  The various methods of treatment have been discussed with the patient and family. After consideration of risks, benefits and other options for treatment, the patient has consented to  Procedure(s): EGD (ESOPHAGOGASTRODUODENOSCOPY) (N/A) COLONOSCOPY (N/A) as a surgical intervention.  The patient's history has been reviewed, patient examined, no change in status, stable for surgery.  I have reviewed the patient's chart and labs.  Questions were answered to the patient's satisfaction.     Santrice Muzio

## 2024-04-04 NOTE — Op Note (Signed)
 [facility] Patient Name: Brittany Middleton Procedure Date: 04/04/2024 MRN: [id number] Attending MD: [name] , MD, [id number] Date of Birth: [birth year] CSN: [id number] Age: 73 Admit Type: Inpatient Procedure:                Upper GI endoscopy Indications:              Recent gastrointestinal bleeding Providers:                [name], MD, Ozell Pouch, Felice Sar,                            Technician Referring MD:              Medicines:                Sedation Administered by an Anesthesia Professional Complications:            No immediate complications. Estimated Blood Loss:     Estimated blood loss was minimal. Procedure:                Pre-Anesthesia Assessment:                           - Prior to the procedure, a History and Physical                            was performed, and patient medications and                            allergies were reviewed. The patient's tolerance of                            previous anesthesia was also reviewed. The risks                            and benefits of the procedure and the sedation                            options and risks were discussed with the patient.                            All questions were answered, and informed consent                            was obtained. Prior Anticoagulants: The patient has                            taken Plavix  (clopidogrel ), last dose was 3 days                            prior to procedure. ASA Grade Assessment: III - A                            patient with severe systemic disease. After  reviewing the risks and benefits, the patient was                            deemed in satisfactory condition to undergo the                            procedure.                           After obtaining informed consent, the endoscope was                            passed under direct vision. Throughout the                             procedure, the patient's blood pressure, pulse, and                            oxygen saturations were monitored continuously. The                            [id number]-[id number] ([id number]) Olympus endoscope was introduced                            through the mouth, and advanced to the second part                            of duodenum. The upper GI endoscopy was                            accomplished without difficulty. The patient                            tolerated the procedure well. Scope In: Scope Out: Findings:      A widely patent Schatzki ring was found at the gastroesophageal junction.      A small hiatal hernia was present.      Scattered mild inflammation characterized by congestion (edema),       erosions and erythema was found in the gastric body, in the gastric       antrum and in the prepyloric region of the stomach. Biopsies were taken       with a cold forceps for histology.      The cardia and gastric fundus were normal on retroflexion.      The duodenal bulb, first portion of the duodenum and second portion of       the duodenum were normal. Impression:               - Widely patent Schatzki ring.                           - Small hiatal hernia.                           - Gastritis. Biopsied.                           -  Normal duodenal bulb, first portion of the                            duodenum and second portion of the duodenum. Moderate Sedation:      Moderate (conscious) sedation was personally administered by an       anesthesia professional. The following parameters were monitored: oxygen       saturation, heart rate, blood pressure, and response to care. Recommendation:            Procedure Code(s):        --- Professional ---                           [phone], Esophagogastroduodenoscopy, flexible,                            transoral; with biopsy, single or multiple Diagnosis Code(s):        --- Professional ---                           K22.2, Esophageal  obstruction                           K44.9, Diaphragmatic hernia without obstruction or                            gangrene                           K29.70, Gastritis, unspecified, without bleeding                           K92.2, Gastrointestinal hemorrhage, unspecified CPT copyright 2022 American Medical Association. All rights reserved. The codes documented in this report are preliminary and upon coder review may  be revised to meet current compliance requirements. [name], MD [name], MD 04/04/2024 11:59:18 AM Number of Addenda: 0

## 2024-04-04 NOTE — Transfer of Care (Signed)
 Immediate Anesthesia Transfer of Care Note  Patient: Brittany Middleton  Procedure(s) Performed: EGD (ESOPHAGOGASTRODUODENOSCOPY) COLONOSCOPY  Patient Location: PACU  Anesthesia Type:MAC  Level of Consciousness: drowsy  Airway & Oxygen Therapy: Patient Spontanous Breathing and Patient connected to nasal cannula oxygen  Post-op Assessment: Report given to RN and Post -op Vital signs reviewed and stable  Post vital signs: Reviewed and stable  Last Vitals:  Vitals Value Taken Time  BP 147/125 04/04/24 11:50  Temp    Pulse 72 04/04/24 11:50  Resp 11 04/04/24 11:50  SpO2 100 % 04/04/24 11:50  Vitals shown include unfiled device data.  Last Pain:  Vitals:   04/04/24 1036  TempSrc: Temporal  PainSc: 2          Complications: No notable events documented.

## 2024-04-04 NOTE — Hospital Course (Addendum)
 This is a very pleasant 73 y.o. 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  CT angiogram did not show any active extravasation or any target vessel.  GI consulted and she underwent an EGD and colonoscopy.  EGD showed mild gastritis and small hiatal hernia with biopsies taken and colonoscopy showed internal/external hemorrhoids with normal TI, as well as diverticulosis and small hepatic flexure polyp which was removed.  GI recommended resuming clopidogrel  on 04/05/2024 and resuming aspirin  on 04/09/24.  They recommended heart healthy diet and follow-up with GI in outpatient setting.  She is medically stable at this time  Assessment and Plan:  Acute blood loss anemia secondary to lower GI bleed/hematochezia/history of diverticulosis, POA: Patient continues to have hematochezia.  No abdominal pain.  CT angiogram negative for active extravasation or any target vessel.  Patient on Plavix  PTA, last dose on 03/31/2024.  Last episode of rectal bleeding 6 AM 04/19/2024.  GI on board, plan for EGD and colonoscopy and EGD showed mild gastritis and small hiatal hernia with biopsies taken and colonoscopy showed internal/external hemorrhoids, normal TI, diverticulosis and small hepatic flexure polyp that was removed with a cold snare.  No evidence of bleeding was noted Hgb/Hct Trend:  Recent Labs  Lab 04/02/24 1319 04/02/24 2041 04/03/24 0303 04/03/24 0945 04/03/24 1814 04/04/24 0212 04/04/24 1245  HGB 7.9* 8.1* 7.9* 7.9* 7.8* 8.0* 7.7*  HCT 25.8*  25.1* 25.1* 25.0* 25.5* 25.3* 24.6*  MCV  --   --  95.4  --   --  95.1  --   -Has not required transfusion so far.  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  She was prescribed neoadjuvant chemotherapy followed by interval debulking surgery and completed adjuvant treatment by December 2022.  Currently on active surveillance.  Outpatient oncology follow-up   History of nonobstructive CAD: Stable.  Patient is not endorsing any anginal symptoms.  Resumed clopidogrel  04/05/2024 and resume aspirin  04/09/2024   Essential Hypertension: Hold antihypertensives at this time in the setting of acute GI bleed.  But can be resumed in outpatient setting as her GI bleeding is resolved   Hyperlipidemia: Continue atorvastatin  and ezetimibe.   OSA: Continue CPAP at night.   Chronic leukopenia: Could be due to chemotherapy.  Stable.  Monitor. WBC Trend:  Recent Labs  Lab 03/31/24 2338 04/02/24 0255 04/03/24 0303 04/04/24 0212  WBC 5.1 3.9* 3.2* 3.8*  -CTM and Trend in the outpt setting.    Mild Intermittent Asthma: Stable, no signs of acute exacerbation.  Continue loratadine and albuterol  PRN.  GERD/GI Prophylaxis: Continue pantoprazole    Class II Obesity: Complicates overall prognosis and care. Estimated body mass index is 37.92 kg/m as calculated from the following:   Height as of this encounter: 5' 5 (1.651 m).   Weight as of this encounter: 103.4 kg. Weight Loss and Dietary Counseling given; Outpt regimen includes Metformin and Ozempic; Has no Hx of DM and last A1c was 5.6 in Dec 2022.

## 2024-04-04 NOTE — Plan of Care (Signed)
  Problem: Clinical Measurements: Goal: Respiratory complications will improve Outcome: Progressing Goal: Cardiovascular complication will be avoided Outcome: Progressing   Problem: Nutrition: Goal: Adequate nutrition will be maintained Outcome: Progressing   Problem: Elimination: Goal: Will not experience complications related to bowel motility Outcome: Progressing

## 2024-04-04 NOTE — Brief Op Note (Signed)
 04/04/2024  12:04 PM  PATIENT:  Brittany Middleton  73 y.o. female  PRE-OPERATIVE DIAGNOSIS:  GI bleed  POST-OPERATIVE DIAGNOSIS:  Hepatic flexure polyp x1; No bleeding; Diverticulosis  PROCEDURE:  Procedure(s): EGD (ESOPHAGOGASTRODUODENOSCOPY) (N/A) COLONOSCOPY (N/A)  SURGEON:  Surgeons and Role:    * Harold Moncus, MD - Primary  Findings ------------ - EGD showed mild gastritis and small hiatal hernia.  Biopsies taken - Colonoscopy showed internal and external hemorrhoids, normal TI, diverticulosis and small hepatic flexure polyp removed with cold snare. - No evidence of active bleeding.  Recommendations ------------------------- - Start heart healthy diet - Resume Plavix  tomorrow - No further inpatient GI workup planned.  GI will sign off.  Call us  back if needed.  [name] MD, FACP 04/04/2024, 12:05 PM  Contact #  [phone]

## 2024-04-04 NOTE — Op Note (Signed)
 [facility] Patient Name: Cole Klugh Procedure Date: 04/04/2024 MRN: [id number] Attending MD: [name] , MD, [id number] Date of Birth: 02-Feb-1951 CSN: [id number] Age: 73 Admit Type: Inpatient Procedure:                Colonoscopy Indications:              Rectal bleeding Providers:                [name], MD, Ozell Pouch, Felice Sar,                            Technician Referring MD:              Medicines:                Sedation Administered by an Anesthesia Professional Complications:            No immediate complications. Estimated Blood Loss:     Estimated blood loss was minimal. Procedure:                Pre-Anesthesia Assessment:                           - Prior to the procedure, a History and Physical                            was performed, and patient medications and                            allergies were reviewed. The patient's tolerance of                            previous anesthesia was also reviewed. The risks                            and benefits of the procedure and the sedation                            options and risks were discussed with the patient.                            All questions were answered, and informed consent                            was obtained. Prior Anticoagulants: The patient has                            taken Plavix  (clopidogrel ), last dose was 3 days                            prior to procedure. ASA Grade Assessment: III - A                            patient with severe systemic disease. After  reviewing the risks and benefits, the patient was                            deemed in satisfactory condition to undergo the                            procedure.                           After obtaining informed consent, the colonoscope                            was passed under direct vision. Throughout the                            procedure, the patient's  blood pressure, pulse, and                            oxygen saturations were monitored continuously. The                            [id number] ([id number]) olympus colonscope was                            introduced through the anus and advanced to the the                            terminal ileum, with identification of the                            appendiceal orifice and IC valve. The colonoscopy                            was performed without difficulty. The patient                            tolerated the procedure well. The quality of the                            bowel preparation was adequate to identify polyps                            greater than 5 mm in size. The terminal ileum,                            ileocecal valve, appendiceal orifice, and rectum                            were photographed. Scope In: 11:28:55 AM Scope Out: 11:43:55 AM Scope Withdrawal Time: 0 hours 10 minutes 48 seconds  Total Procedure Duration: 0 hours 15 minutes 0 seconds  Findings:      Hemorrhoids were found on perianal exam.      The terminal ileum appeared normal.      A 6 mm polyp was found  in the hepatic flexure. The polyp was sessile.       The polyp was removed with a cold snare. Resection and retrieval were       complete.      Multiple diverticula were found in the entire colon.      Internal hemorrhoids were found during retroflexion. The hemorrhoids       were small. Impression:               - Hemorrhoids found on perianal exam.                           - The examined portion of the ileum was normal.                           - One 6 mm polyp at the hepatic flexure, removed                            with a cold snare. Resected and retrieved.                           - Diverticulosis in the entire examined colon.                           - Internal hemorrhoids. Moderate Sedation:      Moderate (conscious) sedation was personally administered by an       anesthesia  professional. The following parameters were monitored: oxygen       saturation, heart rate, blood pressure, and response to care. Recommendation:           - Return patient to hospital ward for ongoing care.                           - Cardiac diet.                           - Continue present medications.                           - Await pathology results.                           - Repeat colonoscopy date to be determined after                            pending pathology results are reviewed for                            surveillance based on pathology results.                           - Return to my office PRN. Procedure Code(s):        --- Professional ---                           [phone], Colonoscopy, flexible; with removal of  tumor(s), polyp(s), or other lesion(s) by snare                            technique Diagnosis Code(s):        --- Professional ---                           K64.8, Other hemorrhoids                           D12.3, Benign neoplasm of transverse colon (hepatic                            flexure or splenic flexure)                           K62.5, Hemorrhage of anus and rectum                           K57.30, Diverticulosis of large intestine without                            perforation or abscess without bleeding CPT copyright 2022 American Medical Association. All rights reserved. The codes documented in this report are preliminary and upon coder review may  be revised to meet current compliance requirements. [name], MD [name], MD 04/04/2024 12:04:16 PM Number of Addenda: 0

## 2024-04-04 NOTE — Anesthesia Preprocedure Evaluation (Addendum)
 Anesthesia Evaluation  Patient identified by MRN, date of birth, ID band Patient awake    Reviewed: Allergy & Precautions, NPO status , Patient's Chart, lab work & pertinent test results  History of Anesthesia Complications Negative for: history of anesthetic complications  Airway Mallampati: III  TM Distance: >3 FB Neck ROM: Full   Comment: Previous grade I view with [facility]  (+) Dental Advisory Given   Pulmonary neg shortness of breath, asthma (seasonal, no inhaler use since the spring) , sleep apnea and Continuous Positive Airway Pressure Ventilation , neg COPD, neg recent URI   Pulmonary exam normal breath sounds clear to auscultation       Cardiovascular hypertension (losartan ), Pt. on medications (-) angina + CAD  (-) Past MI and (-) Cardiac Stents + dysrhythmias (palpitations)  Rhythm:Regular Rate:Normal  HLD, ascending aortic aneurysm  TTE 04/27/2020: IMPRESSIONS    1. Left ventricular ejection fraction, by estimation, is 55 to 60%. The  left ventricle has normal function. The left ventricle has no regional  wall motion abnormalities. Left ventricular diastolic parameters were  normal.   2. Right ventricular systolic function is normal. The right ventricular  size is normal.   3. Left atrial size was mildly dilated.   4. The mitral valve is normal in structure. No evidence of mitral valve  regurgitation. No evidence of mitral stenosis.   5. The aortic valve was not well visualized. Aortic valve regurgitation  is not visualized. No aortic stenosis is present.   6. The inferior vena cava is dilated in size with >50% respiratory  variability, suggesting right atrial pressure of 8 mmHg.     Neuro/Psych neg Seizures  Neuromuscular disease (peripheral neuropathy, cervical disc disease)    GI/Hepatic hiatal hernia, Bowel prep,GERD  Medicated,,Hepatic steatosis GI bleed, peritoneal carcinoma, diverticular disease    Endo/Other  diabetes, Type 2, Oral Hypoglycemic Agents    Renal/GU negative Renal ROS     Musculoskeletal  (+) Arthritis ,    Abdominal  (+) + obese  Peds  Hematology  (+) Blood dyscrasia, anemia Lab Results      Component                Value               Date                      WBC                      3.8 (L)             04/04/2024                HGB                      8.0 (L)             04/04/2024                HCT                      25.3 (L)            04/04/2024                MCV                      95.1  04/04/2024                PLT                      196                 04/04/2024              Anesthesia Other Findings Last Plavix : 03/31/2024  Last Ozempic: 03/31/2024. Denies bloating, nausea, and other GI symptoms. Has been on clear liquids only since 03/31/2024.  Reproductive/Obstetrics H/o uterine cancer                              Anesthesia Physical Anesthesia Plan  ASA: 3  Anesthesia Plan: MAC   Post-op Pain Management: Minimal or no pain anticipated   Induction: Intravenous  PONV Risk Score and Plan: 2 and Propofol  infusion, TIVA and Treatment may vary due to age or medical condition  Airway Management Planned: Natural Airway and Simple Face Mask  Additional Equipment:   Intra-op Plan:   Post-operative Plan:   Informed Consent: I have reviewed the patients History and Physical, chart, labs and discussed the procedure including the risks, benefits and alternatives for the proposed anesthesia with the patient or authorized representative who has indicated his/her understanding and acceptance.     Dental advisory given  Plan Discussed with: CRNA and Anesthesiologist  Anesthesia Plan Comments: (Discussed with patient risks of MAC including, but not limited to, minor pain or discomfort, hearing people in the room, and possible need for backup general anesthesia. Risks for general anesthesia  also discussed including, but not limited to, sore throat, hoarse voice, chipped/damaged teeth, injury to vocal cords, nausea and vomiting, allergic reactions, lung infection, heart attack, stroke, and death. All questions answered. )         Anesthesia Quick Evaluation

## 2024-04-04 NOTE — Anesthesia Procedure Notes (Signed)
 Procedure Name: MAC Date/Time: 04/04/2024 11:15 AM  Performed by: Judythe Tanda Aran, CRNAPre-anesthesia Checklist: Patient identified, Emergency Drugs available, Suction available and Patient being monitored Patient Re-evaluated:Patient Re-evaluated prior to induction Oxygen Delivery Method: Nasal cannula

## 2024-04-04 NOTE — Anesthesia Postprocedure Evaluation (Signed)
 Anesthesia Post Note  Patient: Brittany Middleton  Procedure(s) Performed: EGD (ESOPHAGOGASTRODUODENOSCOPY) COLONOSCOPY     Patient location during evaluation: PACU Anesthesia Type: MAC Level of consciousness: awake Pain management: pain level controlled Vital Signs Assessment: post-procedure vital signs reviewed and stable Respiratory status: spontaneous breathing, nonlabored ventilation and respiratory function stable Cardiovascular status: stable and blood pressure returned to baseline Postop Assessment: no apparent nausea or vomiting Anesthetic complications: no   No notable events documented.  Last Vitals:  Vitals:   04/04/24 1200 04/04/24 1210  BP: (!) 136/59 (!) 109/93  Pulse: 83 (!) 56  Resp: 16 18  Temp:    SpO2: 96% 98%    Last Pain:  Vitals:   04/04/24 1210  TempSrc:   PainSc: 0-No pain                 Delon Aisha Arch

## 2024-04-05 ENCOUNTER — Encounter: Payer: Self-pay | Admitting: Hematology and Oncology

## 2024-04-05 ENCOUNTER — Encounter (HOSPITAL_COMMUNITY): Payer: Self-pay | Admitting: Gastroenterology

## 2024-04-05 LAB — SURGICAL PATHOLOGY
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
## 2024-05-14 ENCOUNTER — Encounter: Payer: Self-pay | Admitting: Hematology and Oncology

## 2024-05-14 ENCOUNTER — Ambulatory Visit
Admission: RE | Admit: 2024-05-14 | Discharge: 2024-05-14 | Disposition: A | Source: Ambulatory Visit | Attending: Family Medicine | Admitting: Family Medicine

## 2024-05-14 ENCOUNTER — Telehealth: Payer: Self-pay

## 2024-05-14 ENCOUNTER — Other Ambulatory Visit: Payer: Self-pay | Admitting: Hematology and Oncology

## 2024-05-14 ENCOUNTER — Telehealth: Payer: Self-pay | Admitting: Hematology and Oncology

## 2024-05-14 DIAGNOSIS — Z1231 Encounter for screening mammogram for malignant neoplasm of breast: Secondary | ICD-10-CM

## 2024-05-14 DIAGNOSIS — D539 Nutritional anemia, unspecified: Secondary | ICD-10-CM

## 2024-05-14 NOTE — Telephone Encounter (Signed)
 left vm for pt to call back and schedule appt, referral sent over from [facility]

## 2024-05-14 NOTE — Telephone Encounter (Signed)
 Called and given lab appt at 0800 on Friday 11/28. She is aware of appt.

## 2024-05-15 ENCOUNTER — Other Ambulatory Visit: Payer: Self-pay | Admitting: Medical Genetics

## 2024-05-15 ENCOUNTER — Inpatient Hospital Stay: Attending: Hematology and Oncology

## 2024-05-15 ENCOUNTER — Inpatient Hospital Stay (HOSPITAL_COMMUNITY)
Admission: EM | Admit: 2024-05-15 | Discharge: 2024-05-20 | DRG: 982 | Disposition: A | Discharge status: Home / Self Care | Attending: Internal Medicine | Admitting: Internal Medicine

## 2024-05-15 ENCOUNTER — Emergency Department (HOSPITAL_COMMUNITY)

## 2024-05-15 ENCOUNTER — Other Ambulatory Visit: Payer: Self-pay

## 2024-05-15 DIAGNOSIS — K625 Hemorrhage of anus and rectum: Secondary | ICD-10-CM

## 2024-05-15 DIAGNOSIS — Z888 Allergy status to other drugs, medicaments and biological substances status: Secondary | ICD-10-CM

## 2024-05-15 DIAGNOSIS — Z8543 Personal history of malignant neoplasm of ovary: Secondary | ICD-10-CM

## 2024-05-15 DIAGNOSIS — K449 Diaphragmatic hernia without obstruction or gangrene: Secondary | ICD-10-CM | POA: Diagnosis present

## 2024-05-15 DIAGNOSIS — D539 Nutritional anemia, unspecified: Secondary | ICD-10-CM

## 2024-05-15 DIAGNOSIS — Z801 Family history of malignant neoplasm of trachea, bronchus and lung: Secondary | ICD-10-CM

## 2024-05-15 DIAGNOSIS — R531 Weakness: Secondary | ICD-10-CM | POA: Diagnosis present

## 2024-05-15 DIAGNOSIS — Z885 Allergy status to narcotic agent status: Secondary | ICD-10-CM

## 2024-05-15 DIAGNOSIS — R61 Generalized hyperhidrosis: Secondary | ICD-10-CM | POA: Diagnosis present

## 2024-05-15 DIAGNOSIS — E66813 Obesity, class 3: Secondary | ICD-10-CM | POA: Diagnosis present

## 2024-05-15 DIAGNOSIS — K2970 Gastritis, unspecified, without bleeding: Secondary | ICD-10-CM | POA: Diagnosis present

## 2024-05-15 DIAGNOSIS — Z7902 Long term (current) use of antithrombotics/antiplatelets: Secondary | ICD-10-CM

## 2024-05-15 DIAGNOSIS — D509 Iron deficiency anemia, unspecified: Secondary | ICD-10-CM | POA: Diagnosis present

## 2024-05-15 DIAGNOSIS — Z79899 Other long term (current) drug therapy: Secondary | ICD-10-CM | POA: Insufficient documentation

## 2024-05-15 DIAGNOSIS — Z7984 Long term (current) use of oral hypoglycemic drugs: Secondary | ICD-10-CM

## 2024-05-15 DIAGNOSIS — D62 Acute posthemorrhagic anemia: Secondary | ICD-10-CM | POA: Diagnosis present

## 2024-05-15 DIAGNOSIS — G4733 Obstructive sleep apnea (adult) (pediatric): Secondary | ICD-10-CM | POA: Diagnosis present

## 2024-05-15 DIAGNOSIS — K649 Unspecified hemorrhoids: Secondary | ICD-10-CM | POA: Diagnosis present

## 2024-05-15 DIAGNOSIS — I10 Essential (primary) hypertension: Secondary | ICD-10-CM | POA: Diagnosis present

## 2024-05-15 DIAGNOSIS — Z8249 Family history of ischemic heart disease and other diseases of the circulatory system: Secondary | ICD-10-CM

## 2024-05-15 DIAGNOSIS — Z7985 Long-term (current) use of injectable non-insulin antidiabetic drugs: Secondary | ICD-10-CM

## 2024-05-15 DIAGNOSIS — Z803 Family history of malignant neoplasm of breast: Secondary | ICD-10-CM

## 2024-05-15 DIAGNOSIS — E7800 Pure hypercholesterolemia, unspecified: Secondary | ICD-10-CM | POA: Diagnosis present

## 2024-05-15 DIAGNOSIS — I2510 Atherosclerotic heart disease of native coronary artery without angina pectoris: Secondary | ICD-10-CM | POA: Diagnosis present

## 2024-05-15 DIAGNOSIS — Z8542 Personal history of malignant neoplasm of other parts of uterus: Secondary | ICD-10-CM

## 2024-05-15 DIAGNOSIS — Z881 Allergy status to other antibiotic agents status: Secondary | ICD-10-CM

## 2024-05-15 DIAGNOSIS — K922 Gastrointestinal hemorrhage, unspecified: Secondary | ICD-10-CM | POA: Diagnosis present

## 2024-05-15 DIAGNOSIS — K219 Gastro-esophageal reflux disease without esophagitis: Secondary | ICD-10-CM | POA: Diagnosis present

## 2024-05-15 DIAGNOSIS — Z854A Personal history of malignant neoplasm of fallopian tube(s): Secondary | ICD-10-CM

## 2024-05-15 DIAGNOSIS — E66812 Obesity, class 2: Secondary | ICD-10-CM

## 2024-05-15 DIAGNOSIS — I724 Aneurysm of artery of lower extremity: Secondary | ICD-10-CM | POA: Diagnosis present

## 2024-05-15 DIAGNOSIS — K5731 Diverticulosis of large intestine without perforation or abscess with bleeding: Principal | ICD-10-CM | POA: Diagnosis present

## 2024-05-15 DIAGNOSIS — Z9841 Cataract extraction status, right eye: Secondary | ICD-10-CM

## 2024-05-15 DIAGNOSIS — C5701 Malignant neoplasm of right fallopian tube: Secondary | ICD-10-CM | POA: Insufficient documentation

## 2024-05-15 DIAGNOSIS — Z6839 Body mass index (BMI) 39.0-39.9, adult: Secondary | ICD-10-CM

## 2024-05-15 DIAGNOSIS — Z9842 Cataract extraction status, left eye: Secondary | ICD-10-CM

## 2024-05-15 DIAGNOSIS — Z87440 Personal history of urinary (tract) infections: Secondary | ICD-10-CM

## 2024-05-15 DIAGNOSIS — R5382 Chronic fatigue, unspecified: Secondary | ICD-10-CM | POA: Diagnosis present

## 2024-05-15 DIAGNOSIS — J45909 Unspecified asthma, uncomplicated: Secondary | ICD-10-CM | POA: Diagnosis present

## 2024-05-15 DIAGNOSIS — J4520 Mild intermittent asthma, uncomplicated: Secondary | ICD-10-CM | POA: Diagnosis present

## 2024-05-15 DIAGNOSIS — Z7982 Long term (current) use of aspirin: Secondary | ICD-10-CM

## 2024-05-15 LAB — CBC
HCT: 31.2 % — ABNORMAL LOW (ref 36.0–46.0)
HCT: 23.6 % — ABNORMAL LOW (ref 36.0–46.0)
Hemoglobin: 9 g/dL — ABNORMAL LOW (ref 12.0–15.0)
Hemoglobin: 7 g/dL — ABNORMAL LOW (ref 12.0–15.0)
MCH: 25.4 pg — ABNORMAL LOW (ref 26.0–34.0)
MCH: 26.1 pg (ref 26.0–34.0)
MCHC: 28.8 g/dL — ABNORMAL LOW (ref 30.0–36.0)
MCHC: 29.7 g/dL — ABNORMAL LOW (ref 30.0–36.0)
MCV: 88.1 fL (ref 80.0–100.0)
MCV: 88.1 fL (ref 80.0–100.0)
Platelets: 318 K/uL (ref 150–400)
Platelets: 234 K/uL (ref 150–400)
RBC: 3.54 MIL/uL — ABNORMAL LOW (ref 3.87–5.11)
RBC: 2.68 MIL/uL — ABNORMAL LOW (ref 3.87–5.11)
RDW: 16.6 % — ABNORMAL HIGH (ref 11.5–15.5)
RDW: 16.7 % — ABNORMAL HIGH (ref 11.5–15.5)
WBC: 4.5 K/uL (ref 4.0–10.5)
WBC: 4.5 K/uL (ref 4.0–10.5)
nRBC: 0 % (ref 0.0–0.2)
nRBC: 0 % (ref 0.0–0.2)

## 2024-05-15 LAB — COMPREHENSIVE METABOLIC PANEL WITH GFR
ALT: 15 U/L (ref 0–44)
ALT: 14 U/L (ref 0–44)
AST: 23 U/L (ref 15–41)
AST: 24 U/L (ref 15–41)
Albumin: 4.4 g/dL (ref 3.5–5.0)
Albumin: 4.1 g/dL (ref 3.5–5.0)
Alkaline Phosphatase: 73 U/L (ref 38–126)
Alkaline Phosphatase: 81 U/L (ref 38–126)
Anion gap: 9 (ref 5–15)
Anion gap: 11 (ref 5–15)
BUN: 14 mg/dL (ref 8–23)
BUN: 16 mg/dL (ref 8–23)
CO2: 28 mmol/L (ref 22–32)
CO2: 26 mmol/L (ref 22–32)
Calcium: 9.3 mg/dL (ref 8.9–10.3)
Calcium: 9.1 mg/dL (ref 8.9–10.3)
Chloride: 102 mmol/L (ref 98–111)
Chloride: 103 mmol/L (ref 98–111)
Creatinine, Ser: 0.67 mg/dL (ref 0.44–1.00)
Creatinine, Ser: 0.8 mg/dL (ref 0.44–1.00)
GFR, Estimated: >60 mL/min (ref >60)
GFR, Estimated: >60 mL/min (ref >60)
Glucose, Bld: 100 mg/dL — ABNORMAL HIGH (ref 70–99)
Glucose, Bld: 119 mg/dL — ABNORMAL HIGH (ref 70–99)
Potassium: 4.1 mmol/L (ref 3.5–5.1)
Potassium: 4.4 mmol/L (ref 3.5–5.1)
Sodium: 139 mmol/L (ref 135–145)
Sodium: 140 mmol/L (ref 135–145)
Total Bilirubin: 0.5 mg/dL (ref 0.0–1.2)
Total Bilirubin: 0.4 mg/dL (ref 0.0–1.2)
Total Protein: 7.1 g/dL (ref 6.5–8.1)
Total Protein: 6.7 g/dL (ref 6.5–8.1)

## 2024-05-15 LAB — TSH: TSH: 1.36 u[IU]/mL (ref 0.350–4.500)

## 2024-05-15 LAB — CBC WITH DIFFERENTIAL/PLATELET
Abs Immature Granulocytes: 0.02 K/uL (ref 0.00–0.07)
Basophils Absolute: 0 K/uL (ref 0.0–0.1)
Basophils Relative: 1 %
Eosinophils Absolute: 0.1 K/uL (ref 0.0–0.5)
Eosinophils Relative: 4 %
HCT: 32.2 % — ABNORMAL LOW (ref 36.0–46.0)
Hemoglobin: 9.6 g/dL — ABNORMAL LOW (ref 12.0–15.0)
Immature Granulocytes: 1 %
Lymphocytes Relative: 19 %
Lymphs Abs: 0.7 K/uL (ref 0.7–4.0)
MCH: 25.4 pg — ABNORMAL LOW (ref 26.0–34.0)
MCHC: 29.8 g/dL — ABNORMAL LOW (ref 30.0–36.0)
MCV: 85.2 fL (ref 80.0–100.0)
Monocytes Absolute: 0.3 K/uL (ref 0.1–1.0)
Monocytes Relative: 8 %
Neutro Abs: 2.4 K/uL (ref 1.7–7.7)
Neutrophils Relative %: 67 %
Platelets: 297 K/uL (ref 150–400)
RBC: 3.78 MIL/uL — ABNORMAL LOW (ref 3.87–5.11)
RDW: 16.7 % — ABNORMAL HIGH (ref 11.5–15.5)
WBC: 3.5 K/uL — ABNORMAL LOW (ref 4.0–10.5)
nRBC: 0 % (ref 0.0–0.2)

## 2024-05-15 LAB — RETICULOCYTES
Immature Retic Fract: 20.5 % — ABNORMAL HIGH (ref 2.3–15.9)
RBC.: 3.6 MIL/uL — ABNORMAL LOW (ref 3.87–5.11)
Retic Count, Absolute: 61.5 K/uL (ref 19.0–186.0)
Retic Ct Pct: 1.7 % (ref 0.4–3.1)

## 2024-05-15 LAB — POC OCCULT BLOOD, ED: Fecal Occult Bld: POSITIVE — AB

## 2024-05-15 LAB — IRON AND IRON BINDING CAPACITY (CC-WL,HP ONLY)
Iron: 17 ug/dL — ABNORMAL LOW (ref 28–170)
Saturation Ratios: 4 % — ABNORMAL LOW (ref 10.4–31.8)
TIBC: 472 ug/dL — ABNORMAL HIGH (ref 250–450)
UIBC: 455 ug/dL

## 2024-05-15 LAB — SEDIMENTATION RATE: Sed Rate: 17 mm/h (ref 0–22)

## 2024-05-15 LAB — VITAMIN B12: Vitamin B-12: 873 pg/mL (ref 180–914)

## 2024-05-15 LAB — FERRITIN: Ferritin: 11 ng/mL (ref 11–307)

## 2024-05-15 MED ORDER — IOHEXOL 350 MG/ML SOLN
100.0000 mL | Freq: Once | INTRAVENOUS | Status: AC | PRN
  Administered 2024-05-15: 100 mL via INTRAVENOUS

## 2024-05-15 NOTE — ED Notes (Signed)
 Patient transported to CT

## 2024-05-15 NOTE — ED Provider Notes (Signed)
 [place] EMERGENCY DEPARTMENT AT [facility] Provider Note   CSN: [id number] Arrival date & time: 05/15/24  2037     History Chief Complaint  Patient presents with  . Rectal Bleeding    HPI: Brittany Middleton is a 73 y.o. female with *** pertinent *** who presents complaining of ***. Patient arrived via {LSARRIVAL:29392} *** History provided by {LSHISTORY:33428}.  {LSINTERPRETER:33419}  ***  Patient's recorded medical, surgical, social, medication list and allergies were reviewed in the Snapshot window as part of the initial history.   Prior to Admission medications   Medication Sig Start Date End Date Taking? Authorizing Provider  acetaminophen  (TYLENOL ) 500 MG tablet Take 1,000 mg by mouth every 6 (six) hours as needed for mild pain, moderate pain, fever or headache.    [provider]  albuterol  (VENTOLIN  HFA) 108 (90 Base) MCG/ACT inhaler Inhale 2 puffs into the lungs every 6 (six) hours as needed for wheezing or shortness of breath. 07/26/23   Kara Dorn NOVAK, MD  aspirin  EC 81 MG tablet Take 81 mg by mouth daily. Swallow whole.    [provider]  atorvastatin  (LIPITOR) 40 MG tablet Take 1 tablet (40 mg total) by mouth daily. 07/05/22   Swinyer, Rosaline HERO, NP  budesonide -formoterol  (BREYNA ) 80-4.5 MCG/ACT inhaler Inhale 2 puffs into the lungs 2 (two) times daily as needed. 07/26/23   Kara Dorn NOVAK, MD  cholecalciferol  (VITAMIN D3) 25 MCG (1000 UNIT) tablet Take 1,000 Units by mouth daily.    [provider]  clopidogrel  (PLAVIX ) 75 MG tablet Take 75 mg by mouth daily.    [provider]  co-enzyme Q-10 30 MG capsule Take 30 mg by mouth daily. 09/17/21   [provider]  estradiol  (ESTRACE  VAGINAL) 0.1 MG/GM vaginal cream Place 1 Applicatorful vaginally at bedtime. 01/28/22   [name], MD  ezetimibe  (ZETIA ) 10 MG tablet Take 10 mg by mouth daily.    [provider]  hydrocortisone 2.5 % cream Apply 1  Application topically daily as needed. 10/14/21   [provider]  loratadine  (CLARITIN ) 10 MG tablet Take 10 mg by mouth daily.    [provider]  losartan  (COZAAR ) 50 MG tablet Take 50 mg by mouth daily. 10/02/21   [provider]  Melatonin 5 MG CHEW Chew 20 mg by mouth at bedtime.    [provider]  metFORMIN (GLUCOPHAGE) 500 MG tablet Take 500 mg by mouth 2 (two) times daily. 10/03/21   [provider]  Omega 3 1000 MG CAPS Take 1 capsule by mouth daily.    [provider]  omeprazole  (PRILOSEC) 40 MG capsule Take 40 mg by mouth every morning.    [provider]  OZEMPIC, 0.25 OR 0.5 MG/DOSE, 2 MG/3ML SOPN INJECT 0.25 MG SUBCTANEOUSLY ONCE A WEEK FOR 30 DAYS 08/23/23   [provider]  polyethylene glycol (MIRALAX  / GLYCOLAX ) 17 g packet Take 17 g by mouth daily.    [provider]  Probiotic Product (PROBIOTIC BLEND PO) Take 1 capsule by mouth daily.    [provider]  UNABLE TO FIND Take 1 capsule by mouth daily. Med Name: [employer] Officer, Historical, MD     Allergies: Macrobid  [nitrofurantoin ], Ciprofloxacin, Codeine, and Azithromycin   Review of Systems   ROS as per HPI  Physical Exam Updated Vital Signs BP (!) 149/83 (BP Location: Left Arm)   Pulse 86   Temp 98.7 F (37.1 C) (Oral)  Resp 18   Ht 5' 4 (1.626 m)   Wt 104.3 kg   SpO2 97%   BMI 39.48 kg/m  Physical Exam  ED Course/ Medical Decision Making/ A&P    Procedures Procedures   Medications Ordered in ED Medications - No data to display  Medical Decision Making:   Brittany Middleton is a 73 y.o. female who presents for ***as per above.  Physical exam is pertinent for ***.   The differential includes but is not limited to ***.  Independent historian: {LSHISTORIAN:33403}  External data reviewed: {LSEXTERNALDATA:33407}  Initial Plan:  ***Screening labs including CBC and Metabolic panel to evaluate  for infectious or metabolic etiology of disease.  *** ***Urinalysis with reflex culture ordered to evaluate for UTI or relevant urologic/nephrologic pathology.  *** to evaluate for structural/infectious intra-*** pathology.  {crccardiactesting:32591::EKG to evaluate for cardiac pathology} Objective evaluation as below reviewed   Labs: {LSLABS:33416}  Radiology: {LSRADS:33417} No results found.  EKG/Medicine tests: {LSEKG:33414} EKG Interpretation:    Decision rules:  {Document cardiac monitor, telemetry assessment procedure when appropriate:1} {   Click here for ABCD2, HEART and other calculatorsREFRESH Note before signing :1}   {Document critical care time when appropriate:1} {Document review of labs and clinical decision tools ie heart score, Chads2Vasc2 etc:1}  {Document your independent review of radiology images, and any outside records:1} {Document your discussion with family members, caretakers, and with consultants:1} {Document social determinants of health affecting pt's care:1} {Document your decision making why or why not admission, treatments were needed:1}                Interventions:***   See the EMR for full details regarding lab and imaging results.  ***  {LSCOPA:33420}  Discussion of management or test interpretations with external provider(s): ***  Risk Drugs:{LSDRUGS:33399} Treatment: {LSTREATMENT:33409} Surgery:{LSSURGERY:33410} Critical Care: ***  Disposition: {LSDISPO:33388}  MDM generated using voice dictation software and may contain dictation errors.  Please contact me for any clarification or with any questions.  Clinical Impression: No diagnosis found.   Data Unavailable   Final Clinical Impression(s) / ED Diagnoses Final diagnoses:  None    Rx / DC Orders ED Discharge Orders     None

## 2024-05-15 NOTE — ED Triage Notes (Signed)
 Patient states that she is having a dark brown bleeding with clots from her rectal. Patient has a history of GI bleeds in October. Patient states she have had 3 bloody bowel movement within the last hour.

## 2024-05-15 NOTE — ED Triage Notes (Signed)
Patient takes blood thinners*

## 2024-05-16 ENCOUNTER — Encounter (HOSPITAL_COMMUNITY): Payer: Self-pay | Admitting: Student

## 2024-05-16 ENCOUNTER — Inpatient Hospital Stay (HOSPITAL_COMMUNITY)

## 2024-05-16 DIAGNOSIS — D500 Iron deficiency anemia secondary to blood loss (chronic): Secondary | ICD-10-CM

## 2024-05-16 DIAGNOSIS — J4520 Mild intermittent asthma, uncomplicated: Secondary | ICD-10-CM | POA: Diagnosis not present

## 2024-05-16 DIAGNOSIS — K5731 Diverticulosis of large intestine without perforation or abscess with bleeding: Principal | ICD-10-CM

## 2024-05-16 DIAGNOSIS — I10 Essential (primary) hypertension: Secondary | ICD-10-CM

## 2024-05-16 DIAGNOSIS — G4733 Obstructive sleep apnea (adult) (pediatric): Secondary | ICD-10-CM

## 2024-05-16 DIAGNOSIS — E66812 Obesity, class 2: Secondary | ICD-10-CM

## 2024-05-16 DIAGNOSIS — K922 Gastrointestinal hemorrhage, unspecified: Secondary | ICD-10-CM

## 2024-05-16 DIAGNOSIS — Z8543 Personal history of malignant neoplasm of ovary: Secondary | ICD-10-CM

## 2024-05-16 DIAGNOSIS — Z854A Personal history of malignant neoplasm of fallopian tube(s): Secondary | ICD-10-CM

## 2024-05-16 HISTORY — PX: IR EMBO ART  VEN HEMORR LYMPH EXTRAV  INC GUIDE ROADMAPPING: IMG5450

## 2024-05-16 HISTORY — PX: IR ANGIOGRAM VISCERAL SELECTIVE: IMG657

## 2024-05-16 HISTORY — PX: IR US GUIDE VASC ACCESS RIGHT: IMG2390

## 2024-05-16 HISTORY — PX: IR ANGIOGRAM SELECTIVE EACH ADDITIONAL VESSEL: IMG667

## 2024-05-16 HISTORY — PX: IR US GUIDE VASC ACCESS LEFT: IMG2389

## 2024-05-16 LAB — BASIC METABOLIC PANEL WITH GFR
Anion gap: 7 (ref 5–15)
BUN: 16 mg/dL (ref 8–23)
CO2: 27 mmol/L (ref 22–32)
Calcium: 8.2 mg/dL — ABNORMAL LOW (ref 8.9–10.3)
Chloride: 106 mmol/L (ref 98–111)
Creatinine, Ser: 0.59 mg/dL (ref 0.44–1.00)
GFR, Estimated: >60 mL/min (ref >60)
Glucose, Bld: 111 mg/dL — ABNORMAL HIGH (ref 70–99)
Potassium: 4 mmol/L (ref 3.5–5.1)
Sodium: 140 mmol/L (ref 135–145)

## 2024-05-16 LAB — CBC
HCT: 26.5 % — ABNORMAL LOW (ref 36.0–46.0)
HCT: 25.1 % — ABNORMAL LOW (ref 36.0–46.0)
Hemoglobin: 8 g/dL — ABNORMAL LOW (ref 12.0–15.0)
Hemoglobin: 7.9 g/dL — ABNORMAL LOW (ref 12.0–15.0)
MCH: 26.3 pg (ref 26.0–34.0)
MCH: 27 pg (ref 26.0–34.0)
MCHC: 30.2 g/dL (ref 30.0–36.0)
MCHC: 31.5 g/dL (ref 30.0–36.0)
MCV: 87.2 fL (ref 80.0–100.0)
MCV: 85.7 fL (ref 80.0–100.0)
Platelets: 231 K/uL (ref 150–400)
Platelets: 202 K/uL (ref 150–400)
RBC: 3.04 MIL/uL — ABNORMAL LOW (ref 3.87–5.11)
RBC: 2.93 MIL/uL — ABNORMAL LOW (ref 3.87–5.11)
RDW: 16.5 % — ABNORMAL HIGH (ref 11.5–15.5)
RDW: 15.9 % — ABNORMAL HIGH (ref 11.5–15.5)
WBC: 5 K/uL (ref 4.0–10.5)
WBC: 8.6 K/uL (ref 4.0–10.5)
nRBC: 0 % (ref 0.0–0.2)
nRBC: 0 % (ref 0.0–0.2)

## 2024-05-16 LAB — MRSA NEXT GEN BY PCR, NASAL: MRSA by PCR Next Gen: NOT DETECTED

## 2024-05-16 LAB — PREPARE RBC (CROSSMATCH)

## 2024-05-16 LAB — PROTIME-INR
INR: 1.1 (ref 0.8–1.2)
Prothrombin Time: 15 s (ref 11.4–15.2)

## 2024-05-16 LAB — HEMOGLOBIN AND HEMATOCRIT, BLOOD
HCT: 27.9 % — ABNORMAL LOW (ref 36.0–46.0)
Hemoglobin: 8.7 g/dL — ABNORMAL LOW (ref 12.0–15.0)

## 2024-05-16 LAB — CA 125: Cancer Antigen (CA) 125: 4 U/mL (ref 0.0–38.1)

## 2024-05-16 MED ORDER — SODIUM CHLORIDE 0.9 % IV SOLN
200.0000 mg | Freq: Once | INTRAVENOUS | Status: AC
  Administered 2024-05-16: 200 mg via INTRAVENOUS
  Filled 2024-05-16: qty 10

## 2024-05-16 MED ORDER — BISACODYL 5 MG PO TBEC: 5.0000 mg | DELAYED_RELEASE_TABLET | Freq: Every day | ORAL | Status: DC | PRN

## 2024-05-16 MED ORDER — SODIUM CHLORIDE 0.9% IV SOLUTION: Freq: Once | INTRAVENOUS | Status: AC

## 2024-05-16 MED ORDER — HEPARIN SODIUM (PORCINE) 1000 UNIT/ML IJ SOLN
INTRAMUSCULAR | Status: AC | PRN
  Administered 2024-05-16: 5000 [IU] via INTRAVENOUS

## 2024-05-16 MED ORDER — ACETAMINOPHEN 650 MG RE SUPP: 650.0000 mg | Freq: Four times a day (QID) | RECTAL | Status: DC | PRN

## 2024-05-16 MED ORDER — ACETAMINOPHEN 325 MG PO TABS: 650.0000 mg | ORAL_TABLET | Freq: Four times a day (QID) | ORAL | Status: DC | PRN

## 2024-05-16 MED ORDER — ALTEPLASE 2 MG IJ SOLR
4.0000 mg | INTRAMUSCULAR | Status: AC
  Administered 2024-05-16: 4 mg
  Filled 2024-05-16: qty 4

## 2024-05-16 MED ORDER — SODIUM CHLORIDE (PF) 0.9 % IJ SOLN
INTRAMUSCULAR | Status: AC
  Filled 2024-05-16: qty 50

## 2024-05-16 MED ORDER — LIDOCAINE-EPINEPHRINE 1 %-1:100000 IJ SOLN
20.0000 mL | Freq: Once | INTRAMUSCULAR | Status: AC
  Administered 2024-05-16: 10 mL

## 2024-05-16 MED ORDER — ONDANSETRON HCL 4 MG/2ML IJ SOLN
INTRAMUSCULAR | Status: AC
  Filled 2024-05-16: qty 2

## 2024-05-16 MED ORDER — FENTANYL CITRATE (PF) 100 MCG/2ML IJ SOLN
INTRAMUSCULAR | Status: AC
  Filled 2024-05-16: qty 2

## 2024-05-16 MED ORDER — FENTANYL CITRATE (PF) 100 MCG/2ML IJ SOLN
INTRAMUSCULAR | Status: AC | PRN
Start: 2024-05-16 — End: 2024-05-16
  Administered 2024-05-16 (×2): 50 ug via INTRAVENOUS

## 2024-05-16 MED ORDER — LIDOCAINE-EPINEPHRINE 1 %-1:100000 IJ SOLN
INTRAMUSCULAR | Status: AC
  Filled 2024-05-16: qty 1

## 2024-05-16 MED ORDER — NITROGLYCERIN 1 MG/10 ML FOR IR/CATH LAB
INTRA_ARTERIAL | Status: AC | PRN
  Administered 2024-05-16 (×3): 100 ug

## 2024-05-16 MED ORDER — MIDAZOLAM HCL 2 MG/2ML IJ SOLN
INTRAMUSCULAR | Status: AC
  Filled 2024-05-16: qty 2

## 2024-05-16 MED ORDER — IOHEXOL 350 MG/ML SOLN
100.0000 mL | Freq: Once | INTRAVENOUS | Status: AC | PRN
  Administered 2024-05-16: 100 mL via INTRAVENOUS

## 2024-05-16 MED ORDER — DIPHENHYDRAMINE HCL 50 MG/ML IJ SOLN
INTRAMUSCULAR | Status: AC
  Filled 2024-05-16: qty 1

## 2024-05-16 MED ORDER — IOHEXOL 300 MG/ML  SOLN
100.0000 mL | Freq: Once | INTRAMUSCULAR | Status: AC | PRN
  Administered 2024-05-16: 25 mL via INTRA_ARTERIAL

## 2024-05-16 MED ORDER — SODIUM CHLORIDE 0.9 % IV BOLUS
1000.0000 mL | Freq: Once | INTRAVENOUS | Status: AC | PRN
Start: 2024-05-16 — End: 2024-05-16
  Administered 2024-05-16: 1000 mL via INTRAVENOUS

## 2024-05-16 MED ORDER — PANTOPRAZOLE SODIUM 40 MG IV SOLR
40.0000 mg | Freq: Two times a day (BID) | INTRAVENOUS | Status: DC
  Administered 2024-05-16 – 2024-05-19 (×7): 40 mg via INTRAVENOUS
  Filled 2024-05-16 (×6): qty 10

## 2024-05-16 MED ORDER — MIDAZOLAM HCL (PF) 2 MG/2ML IJ SOLN
INTRAMUSCULAR | Status: AC | PRN
  Administered 2024-05-16 (×2): 1 mg via INTRAVENOUS

## 2024-05-16 MED ORDER — ONDANSETRON HCL 4 MG/2ML IJ SOLN
4.0000 mg | Freq: Four times a day (QID) | INTRAMUSCULAR | Status: DC | PRN
  Filled 2024-05-16: qty 2

## 2024-05-16 MED ORDER — IOHEXOL 300 MG/ML  SOLN
100.0000 mL | Freq: Once | INTRAMUSCULAR | Status: AC | PRN
  Administered 2024-05-16: 50 mL via INTRA_ARTERIAL

## 2024-05-16 MED ORDER — MIDAZOLAM HCL (PF) 2 MG/2ML IJ SOLN
INTRAMUSCULAR | Status: AC | PRN
Start: 2024-05-16 — End: 2024-05-16
  Administered 2024-05-16 (×2): 1 mg via INTRAVENOUS

## 2024-05-16 MED ORDER — LIDOCAINE HCL 1 % IJ SOLN
INTRAMUSCULAR | Status: AC
Start: 2024-05-16 — End: 2024-05-16
  Filled 2024-05-16: qty 20

## 2024-05-16 MED ORDER — IPRATROPIUM-ALBUTEROL 0.5-2.5 (3) MG/3ML IN SOLN: 3.0000 mL | Freq: Four times a day (QID) | RESPIRATORY_TRACT | Status: DC | PRN

## 2024-05-16 MED ORDER — CHLORHEXIDINE GLUCONATE CLOTH 2 % EX PADS
6.0000 | MEDICATED_PAD | Freq: Every day | CUTANEOUS | Status: DC
  Administered 2024-05-16 – 2024-05-20 (×3): 6 via TOPICAL

## 2024-05-16 MED ORDER — ONDANSETRON HCL 4 MG/2ML IJ SOLN
INTRAMUSCULAR | Status: AC | PRN
  Administered 2024-05-16: 4 mg via INTRAVENOUS

## 2024-05-16 MED ORDER — ORAL CARE MOUTH RINSE: 15.0000 mL | OROMUCOSAL | Status: DC | PRN

## 2024-05-16 MED ORDER — SENNOSIDES-DOCUSATE SODIUM 8.6-50 MG PO TABS: 1.0000 | ORAL_TABLET | Freq: Every evening | ORAL | Status: DC | PRN

## 2024-05-16 MED ORDER — ALTEPLASE 30 MG/30 ML FOR INTERV. RAD: INTRA_ARTERIAL | Status: AC | PRN

## 2024-05-16 MED ORDER — ONDANSETRON HCL 4 MG PO TABS: 4.0000 mg | ORAL_TABLET | Freq: Four times a day (QID) | ORAL | Status: DC | PRN

## 2024-05-16 MED ORDER — HEPARIN SODIUM (PORCINE) 1000 UNIT/ML IJ SOLN
INTRAMUSCULAR | Status: AC
  Filled 2024-05-16: qty 10

## 2024-05-16 MED ORDER — IOHEXOL 300 MG/ML  SOLN
100.0000 mL | Freq: Once | INTRAMUSCULAR | Status: AC | PRN
  Administered 2024-05-16: 40 mL via INTRA_ARTERIAL

## 2024-05-16 MED ORDER — FENTANYL CITRATE (PF) 100 MCG/2ML IJ SOLN
INTRAMUSCULAR | Status: AC | PRN
Start: 2024-05-16 — End: 2024-05-16
  Administered 2024-05-16: 50 ug via INTRAVENOUS
  Administered 2024-05-16: 25 ug via INTRAVENOUS
  Administered 2024-05-16: 50 ug via INTRAVENOUS
  Administered 2024-05-16: 25 ug via INTRAVENOUS
  Administered 2024-05-16: 50 ug via INTRAVENOUS

## 2024-05-16 MED ORDER — NITROGLYCERIN IN D5W 100-5 MCG/ML-% IV SOLN
INTRAVENOUS | Status: AC
Start: 2024-05-16 — End: 2024-05-16
  Filled 2024-05-16: qty 250

## 2024-05-16 MED ORDER — LIDOCAINE HCL 1 % IJ SOLN
20.0000 mL | Freq: Once | INTRAMUSCULAR | Status: AC
  Administered 2024-05-16: 10 mL

## 2024-05-16 NOTE — Progress Notes (Signed)
 IR Brief Progress Note:  IR contacted for patient having large volume bright red bloody bowel movements without clots present. Patient transferred to step down level of care. Nursing reported 1L bloody output was measured within an hour. Patient reports this is different from this morning as they were darker/more melena in nature. Patient seen at bedside. Right femoral site remains unremarkable. CT angio pending per GI.    [place] KATHEE Buff NP 05/16/2024 4:47 PM

## 2024-05-16 NOTE — ED Notes (Signed)
 Patient walked to bathroom and states she had a bowel movement that was nothing but blood.

## 2024-05-16 NOTE — Consult Note (Signed)
 NAME:  Brittany Middleton, MRN:  [id number], DOB:  [birth year], LOS: 0 ADMISSION DATE:  05/15/2024, CONSULTATION DATE:  11/26 REFERRING MD:  EDP, CHIEF COMPLAINT:  Acute on chronic GIB   History of Present Illness:  73 yo female presented with rectal bleeding. Pt states that this is not the first time she has had GIB. She has undergone endoscopy with egd/c-scope without finding source. The then had capsule endoscopy 11/13, results pending.   Unfortunately, around 2000 on 11/25 pt noted maroon stools and clots followed by [facility]. Pt states she is also feeling tired, weak and generally run down. However, pt also states that prior to this event she was in her normal state of health. She states that she has had 6-8 episodes prior to presentation. + abdominal discomfort, + emesis but no blood notes. + diaphoresis, nausea, dizziness/lightheadedness.   Ccm was asked to consult should pt need ICU care after intervention. VSS have been and remain stable. Actively receiving blood  Pertinent  Medical History  Abdominal carcinomatosis Htn Hyperlipidemia Adenocarcinoma of R fallopian tube Asthma Hepatic steatosis Osa on cpap Obesity Chronic anemia H/o gib   Significant Hospital Events: Including procedures, antibiotic start and stop dates in addition to other pertinent events   Ad[facility] to [facility] 11/25 IR angiogram 11/26  Interim History / Subjective:    Objective    Blood pressure (!) 114/55, pulse 86, temperature 97.8 F (36.6 C), temperature source Oral, resp. rate 18, height 5' 4 (1.626 m), weight 104.3 kg, SpO2 98%.        Intake/Output Summary (Last 24 hours) at 05/16/2024 0421 Last data filed at 05/16/2024 0340 Gross per 24 hour  Intake 446 ml  Output --  Net 446 ml   Filed Weights   05/15/24 2050  Weight: 104.3 kg    Examination: General: nad reclining in bed HENT: ncat eomi, perrla, mmmp Lungs: ctab Cardiovascular: rrr Abdomen: soft, nd, nt, bs+ Extremities: no  c/c/e Neuro: no focal deficits GU: deferred  Resolved problem list   Assessment and Plan   ABLA GIB, acute Gerd H/o htn Hyperlipidemia Chronic osa on cpap at baseline H/o abdominal carcinomatosis -s/p IR intervention attempt, no active extravasation so no embolization performed -transfusing hgb >7 -coags to follow -freq h/h checks -plts stable -home meds without anti-htn agents in light of possible worsening bleed and hypotension -cpap prn -ppi ongoing     Labs   CBC: Recent Labs  Lab 05/15/24 0927 05/15/24 2105 05/15/24 2300  WBC 3.5* 4.5 4.5  NEUTROABS 2.4  --   --   HGB 9.6* 9.0* 7.0*  HCT 32.2* 31.2* 23.6*  MCV 85.2 88.1 88.1  PLT 297 318 234    Basic Metabolic Panel: Recent Labs  Lab 05/15/24 0927 05/15/24 2105  NA 139 140  K 4.1 4.4  CL 102 103  CO2 28 26  GLUCOSE 100* 119*  BUN 14 16  CREATININE 0.67 0.80  CALCIUM  9.3 9.1   GFR: Estimated Creatinine Clearance: 75.9 mL/min (by C-G formula based on SCr of 0.8 mg/dL). Recent Labs  Lab 05/15/24 0927 05/15/24 2105 05/15/24 2300  WBC 3.5* 4.5 4.5    Liver Function Tests: Recent Labs  Lab 05/15/24 0927 05/15/24 2105  AST 23 24  ALT 15 14  ALKPHOS 73 81  BILITOT 0.5 0.4  PROT 7.1 6.7  ALBUMIN 4.4 4.1   No results for input(s): LIPASE, AMYLASE in the last 168 hours. No results for input(s): AMMONIA in the last 168 hours.  ABG No results found for: PHART, PCO2ART, PO2ART, HCO3, TCO2, ACIDBASEDEF, O2SAT   Coagulation Profile: No results for input(s): INR, PROTIME in the last 168 hours.  Cardiac Enzymes: No results for input(s): CKTOTAL, CKMB, CKMBINDEX, TROPONINI in the last 168 hours.  HbA1C: Hgb A1c MFr Bld  Date/Time Value Ref Range Status  04/01/2024 08:39 AM 5.3 4.8 - 5.6 % Final    Comment:    (NOTE) Diagnosis of Diabetes The following HbA1c ranges recommended by the American Diabetes Association (ADA) may be used as an aid in the  diagnosis of diabetes mellitus.  Hemoglobin             Suggested A1C NGSP%              Diagnosis  <5.7                   Non Diabetic  5.7-6.4                Pre-Diabetic  >6.4                   Diabetic  <7.0                   Glycemic control for                       adults with diabetes.      CBG: No results for input(s): GLUCAP in the last 168 hours.  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), Dyspnea (06/19/2020), Dysuria (01/26/2021), Epigastric pain (06/19/2020), Essential (primary) hypertension (04/07/2017), Family history of breast cancer (01/27/2021), Family history of lung cancer (01/27/2021), Gastrointestinal bleeding (07/25/2018), Gastrointestinal hemorrhage with melena (07/16/2018), Genetic testing (02/13/2021), GERD (gastroesophageal reflux disease), Hematuria (06/19/2020), Hepatic steatosis, Hiatal hernia, History of palpitations, HLD (hyperlipidemia) (02/27/2018), HTN (hypertension) (02/27/2018), Hypercholesteremia, Hypercholesterolemia (04/07/2017), Hyperglycemia (06/19/2020), Hypertension, Hypertensive disorder (04/07/2017), 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), Hypokalemia (07/17/2018), Lumbar disc disease, Obesity, Class III, BMI 40-49.9 (morbid obesity) (HCC) (05/22/2018), Overweight (03/03/2018), Palpitations (08/19/2015), 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HCC), 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   Surgical History:   Past Surgical History:  Procedure Laterality Date   BLADDER REPAIR     BREAST EXCISIONAL BIOPSY Left    CARPAL TUNNEL RELEASE Right    CATARACT EXTRACTION, BILATERAL  2014   COLONOSCOPY N/A 04/04/2024   Procedure: COLONOSCOPY;  Surgeon: Elicia Claw, MD;  Location: [facility] ENDOSCOPY;  Service: Gastroenterology;  Laterality: N/A;   COLONOSCOPY WITH PROPOFOL  N/A 07/18/2018   Procedure: COLONOSCOPY WITH PROPOFOL ;  Surgeon: Celestia Agent, MD;  Location[facility] ENDOSCOPY;  Service: Endoscopy;  Laterality: N/A;   ESOPHAGOGASTRODUODENOSCOPY N/A 04/04/2024   Procedure: EGD (ESOPHAGOGASTRODUODENOSCOPY);  Surgeon: Elicia Claw, MD;  Location: [facility];  Service: Gastroenterology;  Laterality: N/A;  ESOPHAGOGASTRODUODENOSCOPY (EGD) WITH PROPOFOL  N/A 07/17/2018   Procedure: ESOPHAGOGASTRODUODENOSCOPY (EGD) WITH PROPOFOL ;  Surgeon: Celestia Agent, MD;  Location: [facility] ENDOSCOPY;  Service: Endoscopy;  Laterality: N/A;   IR REMOVAL TUN ACCESS W/ PORT W/O FL MOD SED  08/18/2021   LAPAROSCOPY N/A 12/30/2020   Procedure: LAPAROSCOPY DIAGNOSTIC; WITH  BIOPSY OF ABDOMINAL WALL NODULES AND BIOPSY OF PERICOLONIC EXUDATE;  Surgeon: Kinsinger, Herlene Righter, MD;  Location: [facility] ORS;  Service: General;  Laterality: N/A;   PORTACATH PLACEMENT N/A 01/09/2021   Procedure: PORT INSERTION WITH US  & FLUORO;  Surgeon: Stevie Herlene Righter, MD;  Location: [facility] ORS;  Service: General;  Laterality: N/A;   REPAIR RECTOCELE     TOE SURGERY Left      Social History:   reports that  she has never smoked. She has never used smokeless tobacco. She reports that she does not drink alcohol and does not use drugs.   Family History:  Her family history includes Breast cancer (age of onset: 91) in her mother; CAD in her mother; Cancer in her paternal aunt; Cancer (age of onset: 69) in her paternal aunt; Lung cancer in her paternal grandfather; Lung cancer (age of onset: 26) in her father.   Allergies Allergies  Allergen Reactions   Macrobid  [Nitrofurantoin ] Shortness Of Breath and Cough    Short of breath, cough, myalgia   Ciprofloxacin Other (See Comments)    Caused PAIN IN HANDS    Codeine Nausea And Vomiting   Azithromycin Palpitations     Home Medications  Prior to Admission medications   Medication Sig Start Date End Date Taking? Authorizing Provider  acetaminophen  (TYLENOL ) 500 MG tablet Take 1,000 mg by mouth every 6 (six) hours as needed for mild pain, moderate pain, fever or headache.    [provider]  albuterol  (VENTOLIN  HFA) 108 (90 Base) MCG/ACT inhaler Inhale 2 puffs into the lungs every 6 (six) hours as needed for wheezing or shortness of breath. 07/26/23   Kara Dorn NOVAK, MD  aspirin  EC 81 MG tablet Take 81 mg by mouth daily. Swallow whole.    [provider]  atorvastatin  (LIPITOR) 40 MG tablet Take 1 tablet (40 mg total) by mouth daily. 07/05/22   Swinyer, Rosaline HERO, NP  budesonide -formoterol  (BREYNA ) 80-4.5 MCG/ACT inhaler Inhale 2 puffs into the lungs 2 (two) times daily as needed. 07/26/23   Kara Dorn NOVAK, MD  cholecalciferol  (VITAMIN D3) 25 MCG (1000 UNIT) tablet Take 1,000 Units by mouth daily.    [provider]  clopidogrel  (PLAVIX ) 75 MG tablet Take 75 mg by mouth daily.    [provider]  co-enzyme Q-10 30 MG capsule Take 30 mg by mouth daily. 09/17/21   [provider]  estradiol  (ESTRACE  VAGINAL) 0.1 MG/GM vaginal cream Place 1 Applicatorful vaginally at bedtime. 01/28/22   [name], MD   ezetimibe  (ZETIA ) 10 MG tablet Take 10 mg by mouth daily.    [provider]  hydrocortisone 2.5 % cream Apply 1 Application topically daily as needed. 10/14/21   [provider]  loratadine  (CLARITIN ) 10 MG tablet Take 10 mg by mouth daily.    [provider]  losartan  (COZAAR ) 50 MG tablet Take 50 mg by mouth daily. 10/02/21   [provider]  Melatonin 5 MG CHEW Chew 20 mg by mouth at bedtime.    [provider]  metFORMIN (GLUCOPHAGE) 500 MG tablet Take 500 mg by mouth 2 (two) times daily. 10/03/21   [provider]  Omega 3 1000 MG CAPS Take 1 capsule by mouth daily.    [provider]  omeprazole  (PRILOSEC) 40 MG capsule Take 40 mg by mouth every morning.    [provider]  OZEMPIC, 0.25 OR 0.5 MG/DOSE, 2 MG/3ML SOPN INJECT 0.25 MG SUBCTANEOUSLY ONCE A WEEK FOR 30 DAYS 08/23/23   [provider]  polyethylene glycol (MIRALAX  / GLYCOLAX ) 17 g packet Take 17 g by mouth daily.    [provider]  Probiotic Product (PROBIOTIC BLEND PO) Take 1 capsule by mouth daily.    [provider]  UNABLE TO FIND Take 1 capsule by mouth daily. Med Name: [employer]    [provider]     care time: 

## 2024-05-16 NOTE — ED Notes (Addendum)
 Pt began to yell out for help, upon entry to room pt was dyspneic, diaphoretic, and pale. Assisted pt to bed. BP low, RR increased. Pt vomited small amount. MD made aware. Emergency blood orders placed by MD and blood was started. Foley placed per order and 2nd PIV placed.

## 2024-05-16 NOTE — Progress Notes (Signed)
 Referring Physician(s): Rogelia Satterfield   Supervising Physician: Jennefer Rover  Patient Status:  [facility] - In-pt  Chief Complaint: acute lower gastrointestinal hemorrhage s/p mesenteric angiogram  Subjective: Patient lying in bed. Reports feeling much better and improvement in fatigue after receiving 2 units of PRBCs. States she is going to receive an iron  infusion this afternoon as well. Denies additional episodes of bloody bowel movements after around 0900 this morning.   Denies dizziness, lightheadedness, headache, SHOB, CP, blood in urine, abdominal pain  Allergies: Macrobid  [nitrofurantoin ], Ciprofloxacin, Codeine, and Azithromycin  Medications: Prior to Admission medications   Medication Sig Start Date End Date Taking? Authorizing Provider  acetaminophen  (TYLENOL ) 500 MG tablet Take 500-1,000 mg by mouth See admin instructions. Take 1000 mg by mouth in the morning and 500 mg in the evening   Yes [provider]  albuterol  (VENTOLIN  HFA) 108 (90 Base) MCG/ACT inhaler Inhale 2 puffs into the lungs every 6 (six) hours as needed for wheezing or shortness of breath. 07/26/23  Yes Kara Dorn NOVAK, MD  aspirin  EC 81 MG tablet Take 81 mg by mouth daily. Swallow whole.   Yes [provider]  atorvastatin  (LIPITOR) 40 MG tablet Take 1 tablet (40 mg total) by mouth daily. 07/05/22  Yes Swinyer, Rosaline HERO, NP  budesonide -formoterol  (BREYNA ) 80-4.5 MCG/ACT inhaler Inhale 2 puffs into the lungs 2 (two) times daily as needed. Patient taking differently: Inhale 2 puffs into the lungs 2 (two) times daily as needed (wheezing and SOB). 07/26/23  Yes Kara Dorn NOVAK, MD  cholecalciferol  (VITAMIN D3) 25 MCG (1000 UNIT) tablet Take 1,000 Units by mouth daily.   Yes [provider]  clopidogrel  (PLAVIX ) 75 MG tablet Take 75 mg by mouth daily.   Yes [provider]  co-enzyme Q-10 30 MG capsule Take 30 mg by mouth daily. 09/17/21  Yes [provider]   Collagen-Vitamin C-Biotin (COLLAGEN PO) Take 1 Scoop by mouth daily.   Yes [provider]  estradiol  (ESTRACE  VAGINAL) 0.1 MG/GM vaginal cream Place 1 Applicatorful vaginally at bedtime. Patient taking differently: Place 1 Applicatorful vaginally 3 (three) times a week. 01/28/22  Yes Gorsuch, Ni, MD  ezetimibe  (ZETIA ) 10 MG tablet Take 10 mg by mouth daily.   Yes [provider]  hydrocortisone 2.5 % cream Apply 1 Application topically daily as needed. 10/14/21  Yes [provider]  loratadine  (CLARITIN ) 10 MG tablet Take 10 mg by mouth daily.   Yes [provider]  losartan  (COZAAR ) 50 MG tablet Take 50 mg by mouth daily. 10/02/21  Yes [provider]  Melatonin 5 MG CHEW Chew 20 mg by mouth at bedtime.   Yes [provider]  metFORMIN (GLUCOPHAGE) 500 MG tablet Take 500 mg by mouth 2 (two) times daily. 10/03/21  Yes [provider]  Omega 3 1000 MG CAPS Take 1 capsule by mouth daily.   Yes [provider]  omeprazole  (PRILOSEC) 40 MG capsule Take 40 mg by mouth daily.   Yes [provider]  OZEMPIC, 0.25 OR 0.5 MG/DOSE, 2 MG/3ML SOPN Inject 0.5 mg into the skin once a week. 08/23/23  Yes [provider]  polyethylene glycol (MIRALAX  / GLYCOLAX ) 17 g packet Take 17 g by mouth daily.   Yes [provider]  Probiotic Product (PROBIOTIC BLEND PO) Take 1 capsule by mouth daily.   Yes [provider]  UNABLE TO FIND Take 1 capsule by mouth in the morning and at bedtime. Med Name: [place]  Tail Mushroom   Yes [provider]  Wheat Dextrin (BENEFIBER PO) Take 15 mLs by mouth in the morning and at bedtime.   Yes [provider]     Vital Signs: BP 123/65 (BP Location: Left Arm)   Pulse 77   Temp 98.8 F (37.1 C) (Oral)   Resp 18   Ht 5' 4 (1.626 m)   Wt 240 lb 1.3 oz (108.9 kg)   SpO2 96%   BMI 41.21 kg/m   Physical Exam Cardiovascular:     Rate and Rhythm: Normal rate.   Pulmonary:     Effort: Pulmonary effort is normal. No respiratory distress.  Abdominal:     Palpations: Abdomen is soft.     Tenderness: There is no abdominal tenderness.  Skin:    General: Skin is warm and dry.     Comments:  Bandaid over right femoral site Site is soft and non-tender on palpation No erythema, ecchymosis, or evidence of hematoma  Neurological:     Mental Status: She is alert and oriented to person, place, and time.  Psychiatric:        Mood and Affect: Mood normal.        Behavior: Behavior normal.     Imaging: IR Angiogram Visceral Selective Result Date: 05/16/2024 INDICATION: 73 year old female with history of acute lower gastrointestinal hemorrhage likely secondary to diverticulosis with associated anemia. EXAM: IR ULTRASOUND GUIDANCE VASC ACCESS RIGHT; ADDITIONAL ARTERIOGRAPHY; SELECTIVE VISCERAL ARTERIOGRAPHY MEDICATIONS: None. ANESTHESIA/SEDATION: Moderate (conscious) sedation was employed during this procedure. A total of Versed  2 mg and Fentanyl  100 mcg was administered intravenously. Moderate Sedation Time: 20 minutes. The patient's level of consciousness and vital signs were monitored continuously by radiology nursing throughout the procedure under my direct supervision. CONTRAST:  OMNIPAQUE  IOHEXOL  350 MG/ML SOLN, 25mL OMNIPAQUE  IOHEXOL  300 MG/ML SOLN, 25mL OMNIPAQUE  IOHEXOL  300 MG/ML SOLN FLUOROSCOPY: Radiation Exposure Index (as provided by the fluoroscopic device): 613 mGy reference air Kerma COMPLICATIONS: None immediate. PROCEDURE: Informed consent was obtained from the patient following explanation of the procedure, risks, benefits and alternatives. The patient understands, agrees and consents for the procedure. All questions were addressed. A time out was performed prior to the initiation of the procedure. Maximal barrier sterile technique utilized including caps, mask, sterile gowns, sterile gloves, large sterile drape, hand hygiene, and Betadine  prep. Preprocedure ultrasound evaluation of the right common femoral artery demonstrated patency. The procedure was planned. Subdermal Local anesthesia was administered at the planned needle entry site with 1% lidocaine . A small skin nick was made. Under direct ultrasound visualization, a 21 gauge micropuncture needle was directed into the right common femoral artery. A permanent cm captured and stored in the record. A micropuncture sheath was then introduced through which a limited right lower extremity angiogram was performed which demonstrated adequate puncture site for closure device use. A Bentson wire was directed to the abdominal aorta over which the micropuncture sheath was exchanged for a 5 French vascular sheath. A 5 French rim catheter was introduced over the wire and used to select the inferior mesenteric artery. Inferior mesenteric angiogram was performed which demonstrated patency throughout with arterial supply to the descending and sigmoid colon. There was no obvious extravasation. A 2.4 French Progreat microcatheter and fathom 16 microwire were then used to select the left colic artery middle branch. Dedicated angiogram was performed at this location which demonstrated no evidence of active extravasation in the descending colon. A more distal, descending branch of the left colic artery was  selected and repeat angiogram was performed. Again, no evidence of active extravasation was seen at this site. The catheter was redirected to a superior left colic branch and repeat angiogram was performed at this location. Again, no evidence of active extravasation was visualized. Microcatheter was then retracted to the proximal inferior mesenteric artery and repeat angiogram was performed which demonstrated no evidence of active extravasation. The catheters were removed. The 5 French sheath was exchanged for 6 French Angio-Seal device which was deployed successfully without complication. Peripheral pulses were  unchanged. A sterile bandage was applied. The patient tolerated the procedure well was transferred back to the ED in good condition. IMPRESSION: Technically successful inferior mesenteric angiogram with selective angiography of the left colic artery branches without evidence of active extravasation. No embolization was performed. Ester Sides, MD [facility] Radiology Electronically Signed   By: Ester Sides M.D.   On: 05/16/2024 11:17   IR US  Guide Vasc Access Right Result Date: 05/16/2024 INDICATION: 73 year old female with history of acute lower gastrointestinal hemorrhage likely secondary to diverticulosis with associated anemia. EXAM: IR ULTRASOUND GUIDANCE VASC ACCESS RIGHT; ADDITIONAL ARTERIOGRAPHY; SELECTIVE VISCERAL ARTERIOGRAPHY MEDICATIONS: None. ANESTHESIA/SEDATION: Moderate (conscious) sedation was employed during this procedure. A total of Versed  2 mg and Fentanyl  100 mcg was administered intravenously. Moderate Sedation Time: 20 minutes. The patient's level of consciousness and vital signs were monitored continuously by radiology nursing throughout the procedure under my direct supervision. CONTRAST:  OMNIPAQUE  IOHEXOL  350 MG/ML SOLN, 25mL OMNIPAQUE  IOHEXOL  300 MG/ML SOLN, 25mL OMNIPAQUE  IOHEXOL  300 MG/ML SOLN FLUOROSCOPY: Radiation Exposure Index (as provided by the fluoroscopic device): 613 mGy reference air Kerma COMPLICATIONS: None immediate. PROCEDURE: Informed consent was obtained from the patient following explanation of the procedure, risks, benefits and alternatives. The patient understands, agrees and consents for the procedure. All questions were addressed. A time out was performed prior to the initiation of the procedure. Maximal barrier sterile technique utilized including caps, mask, sterile gowns, sterile gloves, large sterile drape, hand hygiene, and Betadine prep. Preprocedure ultrasound evaluation of the right common femoral  artery demonstrated patency. The procedure was planned. Subdermal Local anesthesia was administered at the planned needle entry site with 1% lidocaine . A small skin nick was made. Under direct ultrasound visualization, a 21 gauge micropuncture needle was directed into the right common femoral artery. A permanent cm captured and stored in the record. A micropuncture sheath was then introduced through which a limited right lower extremity angiogram was performed which demonstrated adequate puncture site for closure device use. A Bentson wire was directed to the abdominal aorta over which the micropuncture sheath was exchanged for a 5 French vascular sheath. A 5 French rim catheter was introduced over the wire and used to select the inferior mesenteric artery. Inferior mesenteric angiogram was performed which demonstrated patency throughout with arterial supply to the descending and sigmoid colon. There was no obvious extravasation. A 2.4 French Progreat microcatheter and fathom 16 microwire were then used to select the left colic artery middle branch. Dedicated angiogram was performed at this location which demonstrated no evidence of active extravasation in the descending colon. A more distal, descending branch of the left colic artery was selected and repeat angiogram was performed. Again, no evidence of active extravasation was seen at this site. The catheter was redirected to a superior left colic branch and repeat angiogram was performed at this location. Again, no evidence of active extravasation was visualized. Microcatheter was then retracted to the proximal inferior mesenteric  artery and repeat angiogram was performed which demonstrated no evidence of active extravasation. The catheters were removed. The 5 French sheath was exchanged for 6 French Angio-Seal device which was deployed successfully without complication. Peripheral pulses were unchanged. A sterile bandage was applied. The patient tolerated the  procedure well was transferred back to the ED in good condition. IMPRESSION: Technically successful inferior mesenteric angiogram with selective angiography of the left colic artery branches without evidence of active extravasation. No embolization was performed. Ester Sides, MD [facility] Radiology Electronically Signed   By: Ester Sides M.D.   On: 05/16/2024 11:17   IR Angiogram Selective Each Additional Vessel Result Date: 05/16/2024 INDICATION: 73 year old female with history of acute lower gastrointestinal hemorrhage likely secondary to diverticulosis with associated anemia. EXAM: IR ULTRASOUND GUIDANCE VASC ACCESS RIGHT; ADDITIONAL ARTERIOGRAPHY; SELECTIVE VISCERAL ARTERIOGRAPHY MEDICATIONS: None. ANESTHESIA/SEDATION: Moderate (conscious) sedation was employed during this procedure. A total of Versed  2 mg and Fentanyl  100 mcg was administered intravenously. Moderate Sedation Time: 20 minutes. The patient's level of consciousness and vital signs were monitored continuously by radiology nursing throughout the procedure under my direct supervision. CONTRAST:  OMNIPAQUE  IOHEXOL  350 MG/ML SOLN, 25mL OMNIPAQUE  IOHEXOL  300 MG/ML SOLN, 25mL OMNIPAQUE  IOHEXOL  300 MG/ML SOLN FLUOROSCOPY: Radiation Exposure Index (as provided by the fluoroscopic device): 613 mGy reference air Kerma COMPLICATIONS: None immediate. PROCEDURE: Informed consent was obtained from the patient following explanation of the procedure, risks, benefits and alternatives. The patient understands, agrees and consents for the procedure. All questions were addressed. A time out was performed prior to the initiation of the procedure. Maximal barrier sterile technique utilized including caps, mask, sterile gowns, sterile gloves, large sterile drape, hand hygiene, and Betadine prep. Preprocedure ultrasound evaluation of the right common femoral artery demonstrated patency. The procedure was  planned. Subdermal Local anesthesia was administered at the planned needle entry site with 1% lidocaine . A small skin nick was made. Under direct ultrasound visualization, a 21 gauge micropuncture needle was directed into the right common femoral artery. A permanent cm captured and stored in the record. A micropuncture sheath was then introduced through which a limited right lower extremity angiogram was performed which demonstrated adequate puncture site for closure device use. A Bentson wire was directed to the abdominal aorta over which the micropuncture sheath was exchanged for a 5 French vascular sheath. A 5 French rim catheter was introduced over the wire and used to select the inferior mesenteric artery. Inferior mesenteric angiogram was performed which demonstrated patency throughout with arterial supply to the descending and sigmoid colon. There was no obvious extravasation. A 2.4 French Progreat microcatheter and fathom 16 microwire were then used to select the left colic artery middle branch. Dedicated angiogram was performed at this location which demonstrated no evidence of active extravasation in the descending colon. A more distal, descending branch of the left colic artery was selected and repeat angiogram was performed. Again, no evidence of active extravasation was seen at this site. The catheter was redirected to a superior left colic branch and repeat angiogram was performed at this location. Again, no evidence of active extravasation was visualized. Microcatheter was then retracted to the proximal inferior mesenteric artery and repeat angiogram was performed which demonstrated no evidence of active extravasation. The catheters were removed. The 5 French sheath was exchanged for 6 French Angio-Seal device which was deployed successfully without complication. Peripheral pulses were unchanged. A sterile bandage was applied. The patient tolerated the procedure well was transferred back  to the ED in  good condition. IMPRESSION: Technically successful inferior mesenteric angiogram with selective angiography of the left colic artery branches without evidence of active extravasation. No embolization was performed. Ester Sides, MD [facility] Radiology Electronically Signed   By: Ester Sides M.D.   On: 05/16/2024 11:17   IR Angiogram Selective Each Additional Vessel Result Date: 05/16/2024 INDICATION: 73 year old female with history of acute lower gastrointestinal hemorrhage likely secondary to diverticulosis with associated anemia. EXAM: IR ULTRASOUND GUIDANCE VASC ACCESS RIGHT; ADDITIONAL ARTERIOGRAPHY; SELECTIVE VISCERAL ARTERIOGRAPHY MEDICATIONS: None. ANESTHESIA/SEDATION: Moderate (conscious) sedation was employed during this procedure. A total of Versed  2 mg and Fentanyl  100 mcg was administered intravenously. Moderate Sedation Time: 20 minutes. The patient's level of consciousness and vital signs were monitored continuously by radiology nursing throughout the procedure under my direct supervision. CONTRAST:  OMNIPAQUE  IOHEXOL  350 MG/ML SOLN, 25mL OMNIPAQUE  IOHEXOL  300 MG/ML SOLN, 25mL OMNIPAQUE  IOHEXOL  300 MG/ML SOLN FLUOROSCOPY: Radiation Exposure Index (as provided by the fluoroscopic device): 613 mGy reference air Kerma COMPLICATIONS: None immediate. PROCEDURE: Informed consent was obtained from the patient following explanation of the procedure, risks, benefits and alternatives. The patient understands, agrees and consents for the procedure. All questions were addressed. A time out was performed prior to the initiation of the procedure. Maximal barrier sterile technique utilized including caps, mask, sterile gowns, sterile gloves, large sterile drape, hand hygiene, and Betadine prep. Preprocedure ultrasound evaluation of the right common femoral artery demonstrated patency. The procedure was planned. Subdermal Local anesthesia was administered  at the planned needle entry site with 1% lidocaine . A small skin nick was made. Under direct ultrasound visualization, a 21 gauge micropuncture needle was directed into the right common femoral artery. A permanent cm captured and stored in the record. A micropuncture sheath was then introduced through which a limited right lower extremity angiogram was performed which demonstrated adequate puncture site for closure device use. A Bentson wire was directed to the abdominal aorta over which the micropuncture sheath was exchanged for a 5 French vascular sheath. A 5 French rim catheter was introduced over the wire and used to select the inferior mesenteric artery. Inferior mesenteric angiogram was performed which demonstrated patency throughout with arterial supply to the descending and sigmoid colon. There was no obvious extravasation. A 2.4 French Progreat microcatheter and fathom 16 microwire were then used to select the left colic artery middle branch. Dedicated angiogram was performed at this location which demonstrated no evidence of active extravasation in the descending colon. A more distal, descending branch of the left colic artery was selected and repeat angiogram was performed. Again, no evidence of active extravasation was seen at this site. The catheter was redirected to a superior left colic branch and repeat angiogram was performed at this location. Again, no evidence of active extravasation was visualized. Microcatheter was then retracted to the proximal inferior mesenteric artery and repeat angiogram was performed which demonstrated no evidence of active extravasation. The catheters were removed. The 5 French sheath was exchanged for 6 French Angio-Seal device which was deployed successfully without complication. Peripheral pulses were unchanged. A sterile bandage was applied. The patient tolerated the procedure well was transferred back to the ED in good condition. IMPRESSION: Technically successful  inferior mesenteric angiogram with selective angiography of the left colic artery branches without evidence of active extravasation. No embolization was performed. Ester Sides, MD [facility] Radiology Electronically Signed   By: Ester Sides HERO.D.  On: 05/16/2024 11:17   CT Angio Abd/Pel W and/or Wo Contrast Result Date: 05/15/2024 EXAM: CTA ABDOMEN AND PELVIS WITH AND WITHOUT CONTRAST 05/15/2024 10:47:14 PM TECHNIQUE: CTA images of the abdomen and pelvis without and with intravenous contrast. Three-dimensional MIP/volume rendered formations were performed. Automated exposure control, iterative reconstruction, and/or weight based adjustment of the mA/kV was utilized to reduce the radiation dose to as low as reasonably achievable. COMPARISON: Comparison with abdominal radiograph 05/14/2024 and CT abdomen and pelvis 04/01/2024. CLINICAL HISTORY: Lower GI bleed. FINDINGS: VASCULATURE: AORTA: Minimal scattered aortic calcification. No acute finding. No abdominal aortic aneurysm. No dissection. CELIAC TRUNK: No acute finding. No occlusion or significant stenosis. SUPERIOR MESENTERIC ARTERY: No acute finding. No occlusion or significant stenosis. RENAL ARTERIES: No acute finding. No occlusion or significant stenosis. ILIAC ARTERIES: No acute finding. No occlusion or significant stenosis. LIVER: The liver is unremarkable. GALLBLADDER AND BILE DUCTS: Gallbladder is unremarkable. No biliary ductal dilatation. SPLEEN: The spleen is unremarkable. PANCREAS: The pancreas is unremarkable. ADRENAL GLANDS: Bilateral adrenal glands demonstrate no acute abnormality. KIDNEYS, URETERS AND BLADDER: No stones in the kidneys or ureters. No hydronephrosis. No perinephric or periureteral stranding. Urinary bladder is unremarkable. GI AND BOWEL: Small periumbilical hernia with broad base containing small bowel and colon but without proximal obstruction. Diffusely stool-filled colon. No  small or large bowel distention. Colonic diverticula without evidence of acute diverticulitis. In the mid descending colon, there is a focal area of intraluminal contrast extravasation on the arterial phase with progressive pooling on the delayed phase. No corresponding density is demonstrated on the unenhanced images. This is consistent with a focal site of acute gastrointestinal hemorrhage. There is a diverticulum adjacent to the area of hemorrhage, suggesting that this is most likely to represent diverticular bleed , less likely due to an occult mass. REPRODUCTIVE: The uterus is surgically absent. PERITONEUM AND RETRPERITONEUM: No ascites or free air. LUNG BASE: Lung bases are clear. LYMPH NODES: No lymphadenopathy. BONES AND SOFT TISSUES: Degenerative changes in the spine. Scarring along the midline consistent with postoperative change. No acute abnormality of the bones. No acute soft tissue abnormality. Critical results/urgent findings were called at 10:56 pm on 05/15/2024 by radiologist W. Okey Gravely, MD to Dr. Jerilynn Later. IMPRESSION: 1. Focal site of acute gastrointestinal hemorrhage in the mid descending colon, with intraluminal contrast extravasation on arterial phase and progressive pooling on delayed phase. Etiology is indeterminate but most likely to represent diverticular bleed. 2. Small periumbilical hernia containing small bowel and colon without proximal obstruction. Electronically signed by: Elsie Gravely MD 05/15/2024 11:02 PM EST RP Workstation: [id number]    Labs:  CBC: Recent Labs    05/15/24 0927 05/15/24 2105 05/15/24 2300 05/16/24 0519 05/16/24 1309  WBC 3.5* 4.5 4.5 5.0  --   HGB 9.6* 9.0* 7.0* 8.0* 8.7*  HCT 32.2* 31.2* 23.6* 26.5* 27.9*  PLT 297 318 234 231  --     COAGS: Recent Labs    05/16/24 0519  INR 1.1    BMP: Recent Labs    04/04/24 0212 05/15/24 0927 05/15/24 2105 05/16/24 0519  NA 143 139 140 140  K 3.9 4.1 4.4 4.0  CL 105 102 103 106   CO2 27 28 26 27   GLUCOSE 92 100* 119* 111*  BUN 8 14 16 16   CALCIUM  8.9 9.3 9.1 8.2*  CREATININE 0.60 0.67 0.80 0.59  GFRNONAA >60 >60 >60 >60    LIVER FUNCTION TESTS: Recent Labs    03/31/24 2338 04/04/24 0212 05/15/24 0927 05/15/24  2105  BILITOT 0.5 0.5 0.5 0.4  AST 24 23 23 24   ALT 18 15 15 14   ALKPHOS 71 48 73 81  PROT 6.5 5.5* 7.1 6.7  ALBUMIN 4.2 3.6 4.4 4.1    Assessment and Plan: Acute gastrointestinal bleed-  CTA abdomen pelvis - focal site of acute gastrointestinal hemorrhage in the mid descending colon most likely from diverticular bleed S/p mesenteric angiogram with no embolization  Patient denies additional episodes of bloody bowel movements after around 0900 this morning  Positive FOBT 2 units PRBC transfused 1 dose of IV iron  ordered Hgb 9 > 7 > 8.7 VSS  Please notify IR with any new hemorrhagic shock as repeat angiogram could be considered. IR will follow.   Electronically Signed: Havilah Topor B Robynne Roat, NP 05/16/2024, 1:51 PM   I spent a total of 15 Minutes at the the patient's bedside AND on the patient's hospital floor or unit, greater than 50% of which was counseling/coordinating care for acute gastrointestinal bleed follow up s/p mesenteric angiogram.

## 2024-05-16 NOTE — Progress Notes (Signed)
 Patient had bright red bloody stool at 0900 in bedside commode. 2nd unit of blood currently infusing, pt asymptomatic at this time.

## 2024-05-16 NOTE — Progress Notes (Addendum)
  PROGRESS NOTE  Patient admitted earlier this morning. See H&P.   Brittany Middleton is a 73 y.o. female with medical history significant for adenocarcinoma of the right fallopian tube, abdominal carcinomatosis, HTN, HLD, mild intermittent asthma, hepatic steatosis, OSA on CPAP, obesity, anemia, GI bleed and a recent hospitalization for lower GI bleed now presented with rectal bleeding. Patient reports during her hospitalization in October, an EGD and colonoscopy did not find the source of her bleeding. She had follow-up with Dr. Burnette on November 13 and a capsule endoscopy was performed, currently pending results.  She presented again with rectal bleeding, had numerous hematochezia episodes in the emergency department along with lightheadedness, diaphoresis, nausea and vomiting.  In the emergency department, patient was found to have hemoglobin 7.0, positive FOBT, iron  deficient. CTA abdomen and pelvis shows focal site of acute GI hemorrhage in the mid descending colon concerning for diverticular bleed. Interventional radiology and GI were consulted for evaluation.  PCCM also consulted.  Patient underwent IR evaluation, no embolization performed as no active extravasation visualized.  Patient seen and examined this morning.  Feeling well, currently receiving her second unit packed red blood cell transfusion.  Also asking about getting iron  transfusion.  States that she has been taking p.o. iron  as outpatient and despite this, her iron  levels were low with ferritin 11 on admission.  - GI consulted, Dr. Dianna - Ordered IV iron  - IV PPI - Repeat hemoglobin and hematocrit 2 hours after transfusion complete - Remains n.p.o. until GI seen   Status is: Inpatient Remains inpatient appropriate because: GI bleed   Delon Hoe, DO [facility] 05/16/2024, 1:11 PM  Available via Epic secure chat 7am-7pm After these hours, please refer to coverage provider listed on [email or web address]    Addendum  #1: Called by RN regarding large amount blood per rectum. She had clear liquid diet for lunch and then had 2 episodes back to back of dark red blood output ~24ml. Also reported feeling shaky and SOB. Patient evaluated at bedside. She is in no acute distress. Vitals are stable. Discussed with IR who recommends angiogram if shock present. She had been made clear liquid diet, but I will change back to NPO again. Awaiting GI input. Will transfuse another unit of blood. Discussed with bedside RN.   Delon Hoe, DO [facility] 05/16/2024, 3:40 PM    Addendum #2: Patient re-evaluated in stepdown unit ~5pm. Discussed with Dr. Dianna and Dr. Jennefer. Ordered stat CBC and stat CT angio. Patient is alert, oriented, conversational, and BP remains stable.   Delon Hoe, DO [facility] 05/16/2024, 6:31 PM    Addendum #3: Spoke with radiology. Bleeding seen again in same mid-descending colon area. Spoke with Dr. Karalee who will review imaging.    Delon Hoe, DO [facility] 05/16/2024, 6:59 PM   Available via Epic secure chat 7am-7pm After these hours, please refer to coverage provider listed on [email or web address]

## 2024-05-16 NOTE — Procedures (Signed)
 Interventional Radiology Procedure Note  Procedure: Provocative angiogram and embolization of distal branches of the left colic artery with cessation of bleeding.   Complications: Mild dissection of IMA, very likely self limited and requiring no treatment.   Estimated Blood Loss: None  Recommendations: - Bed rest x 1 hr - Trend H&H, transfuse as needed   Signed,  Wilkie LOIS Lent, MD

## 2024-05-16 NOTE — Progress Notes (Addendum)
     Patient Name: Brittany Middleton           DOB: 03-30-1951  MRN: [id number]      Admission Date: 05/15/2024  Attending Provider: Rojelio Nest, DO  Primary Diagnosis: GI bleed   Level of care: Stepdown   OVERNIGHT EVENT  Follow-up on patient per Dr. Rojelio request. Patient admitted with GI bleed.  Concerns for hemorrhage after having large-volume bright red bloody bowel movements ~ 1L within an hour. A second CT angio ordered per GI.  Dr Karalee (IR) is reviewing CTA below, awaiting decision for intervention.  Repeat CTA Active gastrointestinal hemorrhage along the mesenteric wall of the mid descending colon, in a similar location where the active hemorrhage with seen on previous CT angiography procedure. Visceral angiogram performed earlier today did not demonstrate active hemorrhage in this region however.  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   Patient remains hemodynamically stable, and is receiving a unit of PRBC now.  Does not appear to be in active distress. Patient is is alert and oriented x 4. At this time, patient endorses lightheadedness and weakness.  She appears pale.   Denies bloody BM in the past hour. Son is at bedside updated.    Addendum: Prepping pt for IR for angiogram and embolization    Lavanda Horns, DNP, ACNPC- AG [facility] [place]

## 2024-05-16 NOTE — Consult Note (Signed)
 Referring Provider: Dr. Lou Primary Care Physician:  Dyane Anthony RAMAN, FNP Primary Gastroenterologist:  Dr. Burnette  Reason for Consultation: Rectal bleeding  HPI: Brittany Middleton is a 73 y.o. female who was admitted in October for GI bleed and had a EGD colonoscopy at that time did not show any active bleeding.  EGD showed hiatal hernia and gastritis.  Colonoscopy showed diverticulosis hemorrhoids and a small sessile serrated polyp was removed.  Prior to admission she had acute onset of bright red and maroon blood per rectum with clots that happened multiple times.  Denies any associated abdominal pain nausea vomiting.  Hemoglobin 9.6 yesterday on initial presentation and it was 7.7 on 10/15.  Hemoglobin dropped to 7.0 yesterday and she was transfused to 8.7.  She received 2 units of packed red blood cells last night and is receiving her third unit now.  She was recently transferred to the stepdown unit due to dizziness with recurrent maroon-colored stool x 4 times.  Initial CT angiogram was positive for acute bleeding in the mid descending colon and a subsequent IR angiogram was negative for active bleeding and therefore embolization was not done.  On Plavix  as outpatient.  Currently in stepdown unit bed with nursing staff in room and son present.  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 Last Assessment & Plan:  Formatting of this note might be different from the original. - continue home meds   Family history of breast cancer 01/27/2021   Family history of lung cancer 01/27/2021   Gastrointestinal bleeding 07/25/2018   Gastrointestinal hemorrhage with melena 07/16/2018   Genetic testing 02/13/2021   No pathogenic variants detected in [facility].  The report date is February 12, 2021.   The [facility] gene panel offered by [facility] includes sequencing and deletion/duplication testing of the following 48 genes: APC, ATM, AXIN2, BAP1, BARD1, BMPR1A, BRCA1, BRCA2, BRIP1, CHD1, CDK4, CDKN2A(p16 and p14ARF), CHEK2, CTNNA1, EGFR, EPCAM, FH, FLCN, GREM1, HOXB13, MEN1, M   GERD (gastroesophageal reflux disease)    Hematuria 06/19/2020   Hepatic steatosis    per imaging 2016, 2019   Hiatal hernia    History of palpitations    Holter monitor, 2017: NSR, occasional PVC, PACs, no arrhythmia   HLD (hyperlipidemia) 02/27/2018   HTN (hypertension) 02/27/2018   Hypercholesteremia    Hypercholesterolemia 04/07/2017   Hyperglycemia 06/19/2020   Hypertension    Hypertensive disorder 04/07/2017   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   Hypokalemia 07/17/2018   Lumbar disc disease    MRI, 2017    Obesity, Class III, 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   Overweight 03/03/2018   Palpitations 08/19/2015   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HCC)    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   Physical debility 01/10/2021   Pneumonia    Pure hypercholesterolemia, unspecified 04/07/2017  Pyelonephritis 03/30/2020   Recurrent UTI 05/28/2020   SBO (small bowel obstruction) (HCC) 12/28/2020   Sepsis secondary to UTI (HCC) 04/25/2020   Severe sepsis (HCC) 05/21/2020   Severe sepsis with acute organ dysfunction (HCC) 04/25/2020   Skin infection 02/05/2021   Skin irritation 02/05/2021   Sleep apnea    Urinary tract infection due to extended-spectrum beta  lactamase (ESBL) producing Escherichia coli    Uterine cancer (HCC) 01/01/2021    Past Surgical History:  Procedure Laterality Date   BLADDER REPAIR     BREAST EXCISIONAL BIOPSY Left    CARPAL TUNNEL RELEASE Right    CATARACT EXTRACTION, BILATERAL  2014   COLONOSCOPY N/A 04/04/2024   Procedure: COLONOSCOPY;  Surgeon: Elicia Claw, MD;  Location: [facility] ENDOSCOPY;  Service: Gastroenterology;  Laterality: N/A;   COLONOSCOPY WITH PROPOFOL  N/A 07/18/2018   Procedure: COLONOSCOPY WITH PROPOFOL ;  Surgeon: Celestia Agent, MD;  Location: [facility] ENDOSCOPY;  Service: Endoscopy;  Laterality: N/A;   ESOPHAGOGASTRODUODENOSCOPY N/A 04/04/2024   Procedure: EGD (ESOPHAGOGASTRODUODENOSCOPY);  Surgeon: Elicia Claw, MD;  Location: [facility];  Service: Gastroenterology;  Laterality: N/A;   ESOPHAGOGASTRODUODENOSCOPY (EGD) WITH PROPOFOL  N/A 07/17/2018   Procedure: ESOPHAGOGASTRODUODENOSCOPY (EGD) WITH PROPOFOL ;  Surgeon: Celestia Agent, MD;  Location[facility]COPY;  Service: Endoscopy;  Laterality: N/A;   IR ANGIOGRAM SELECTIVE EACH ADDITIONAL VESSEL  05/16/2024   IR ANGIOGRAM SELECTIVE EACH ADDITIONAL VESSEL  05/16/2024   IR ANGIOGRAM VISCERAL SELECTIVE  05/16/2024   IR REMOVAL TUN ACCESS W/ PORT W/O FL MOD SED  08/18/2021   IR US  GUIDE VASC ACCESS RIGHT  05/16/2024   LAPAROSCOPY N/A 12/30/2020   Procedure: LAPAROSCOPY DIAGNOSTIC; WITH  BIOPSY OF ABDOMINAL WALL NODULES AND BIOPSY OF PERICOLONIC EXUDATE;  Surgeon: Stevie Herlene Righter, MD;  Location: [facility] ORS;  Service: General;  Laterality: N/A;   PORTACATH PLACEMENT N/A 01/09/2021   Procedure: PORT INSERTION WITH US  & FLUORO;  Surgeon: Stevie Herlene Righter, MD;  Location: [facility] ORS;  Service: General;  Laterality: N/A;   REPAIR RECTOCELE     TOE SURGERY Left     Prior to Admission medications   Medication Sig Start Date End Date Taking? Authorizing Provider  acetaminophen  (TYLENOL ) 500 MG tablet Take 500-1,000 mg by mouth See admin instructions. Take  1000 mg by mouth in the morning and 500 mg in the evening   Yes [provider]  albuterol  (VENTOLIN  HFA) 108 (90 Base) MCG/ACT inhaler Inhale 2 puffs into the lungs every 6 (six) hours as needed for wheezing or shortness of breath. 07/26/23  Yes Kara Dorn NOVAK, MD  aspirin  EC 81 MG tablet Take 81 mg by mouth daily. Swallow whole.   Yes [provider]  atorvastatin  (LIPITOR) 40 MG tablet Take 1 tablet (40 mg total) by mouth daily. 07/05/22  Yes Swinyer, Rosaline HERO, NP  budesonide -formoterol  (BREYNA ) 80-4.5 MCG/ACT inhaler Inhale 2 puffs into the lungs 2 (two) times daily as needed. Patient taking differently: Inhale 2 puffs into the lungs 2 (two) times daily as needed (wheezing and SOB). 07/26/23  Yes Kara Dorn NOVAK, MD  cholecalciferol  (VITAMIN D3) 25 MCG (1000 UNIT) tablet Take 1,000 Units by mouth daily.   Yes [provider]  clopidogrel  (PLAVIX ) 75 MG tablet Take 75 mg by mouth daily.   Yes [provider]  co-enzyme Q-10 30 MG capsule Take 30 mg by mouth daily. 09/17/21  Yes [provider]  Collagen-Vitamin C-Biotin (COLLAGEN PO) Take 1 Scoop by mouth daily.   Yes [provider]  estradiol  (ESTRACE  VAGINAL) 0.1 MG/GM vaginal cream Place 1 Applicatorful vaginally at bedtime. Patient taking differently: Place 1 Applicatorful vaginally 3 (three) times a week. 01/28/22  Yes Gorsuch, Ni, MD  ezetimibe  (ZETIA ) 10 MG tablet Take 10 mg by mouth daily.   Yes [provider]  hydrocortisone 2.5 % cream Apply 1 Application topically daily as needed. 10/14/21  Yes [provider]  loratadine  (CLARITIN ) 10 MG tablet Take 10 mg by mouth daily.   Yes [provider]  losartan  (COZAAR ) 50 MG tablet Take 50 mg by mouth daily. 10/02/21  Yes [provider]  Melatonin 5 MG CHEW Chew 20 mg by mouth at bedtime.   Yes [provider]  metFORMIN (GLUCOPHAGE) 500 MG tablet Take 500 mg by mouth 2 (two) times daily.  10/03/21  Yes [provider]  Omega 3 1000 MG CAPS Take 1 capsule by mouth daily.   Yes [provider]  omeprazole  (PRILOSEC) 40 MG capsule Take 40 mg by mouth daily.   Yes [provider]  OZEMPIC, 0.25 OR 0.5 MG/DOSE, 2 MG/3ML SOPN Inject 0.5 mg into the skin once a week. 08/23/23  Yes [provider]  polyethylene glycol (MIRALAX  / GLYCOLAX ) 17 g packet Take 17 g by mouth daily.   Yes [provider]  Probiotic Product (PROBIOTIC BLEND PO) Take 1 capsule by mouth daily.   Yes [provider]  UNABLE TO FIND Take 1 capsule by mouth in the morning and at bedtime. Med Name: Turkey Tail Mushroom   Yes [provider]  Wheat Dextrin (BENEFIBER PO) Take 15 mLs by mouth in the morning and at bedtime.   Yes [provider]    Scheduled Meds:  sodium chloride    Intravenous Once   Chlorhexidine  Gluconate Cloth  6 each Topical Daily   pantoprazole  (PROTONIX ) IV  40 mg Intravenous Q12H   Continuous Infusions: PRN Meds:.acetaminophen  **OR** acetaminophen , ipratropium-albuterol , ondansetron  **OR** ondansetron  (ZOFRAN ) IV, mouth rinse  Allergies as of 05/15/2024 - Review Complete 05/15/2024  Allergen Reaction Noted   Macrobid  [nitrofurantoin ] Shortness Of Breath and Cough 05/21/2020   Ciprofloxacin Other (See Comments) 03/01/2018   Codeine Nausea And Vomiting 10/31/2014   Azithromycin Palpitations 01/07/2021    Family History  Problem Relation Age of Onset   CAD Mother    Breast cancer Mother 9   Lung cancer Father 71   Cancer Paternal Aunt        GYN cancer? bladder? d. early 35s   Cancer Paternal Aunt 51       GYN cancer   Lung cancer Paternal Grandfather        d. 74; smoking hx    Social History   Socioeconomic History   Marital status: Divorced    Spouse name: Not on file   Number of children: 2   Years of education: Not on file   Highest education level: Master's degree (e.g., MA, MS, MEng, MEd, MSW, MBA)   Occupational History   Occupation: Retired  Tobacco Use   Smoking status: Never   Smokeless tobacco: Never  Vaping Use   Vaping status: Never Used  Substance and Sexual Activity   Alcohol use: No   Drug use: No   Sexual activity: Not Currently    Birth control/protection: Post-menopausal  Other Topics Concern   Not on file  Social History Narrative   Not on file   Social Drivers of Health   Financial Resource Strain: Low Risk  (02/27/2018)   Overall  Financial Resource Strain (CARDIA)    Difficulty of Paying Living Expenses: Not hard at all  Food Insecurity: No Food Insecurity (05/16/2024)   Hunger Vital Sign    Worried About Running Out of Food in the Last Year: Never true    Ran Out of Food in the Last Year: Never true  Transportation Needs: No Transportation Needs (05/16/2024)   PRAPARE - Administrator, Civil Service (Medical): No    Lack of Transportation (Non-Medical): No  Physical Activity: Sufficiently Active (02/27/2018)   Exercise Vital Sign    Days of Exercise per Week: 7 days    Minutes of Exercise per Session: 60 min  Stress: No Stress Concern Present (02/27/2018)   [employer] of Occupational Health - Occupational Stress Questionnaire    Feeling of Stress : Only a little  Social Connections: Moderately Isolated (05/16/2024)   Social Connection and Isolation Panel    Frequency of Communication with Friends and Family: More than three times a week    Frequency of Social Gatherings with Friends and Family: More than three times a week    Attends Religious Services: Never    Database Administrator or Organizations: Yes    Attends Banker Meetings: 1 to 4 times per year    Marital Status: Divorced  Intimate Partner Violence: Not At Risk (05/16/2024)   Humiliation, Afraid, Rape, and Kick questionnaire    Fear of Current or Ex-Partner: No    Emotionally Abused: No    Physically Abused: No    Sexually Abused: No    Review of Systems:  All negative except as stated above in HPI.  Physical Exam: Vital signs: Vitals:   05/16/24 1534 05/16/24 1550  BP: (!) 141/56 134/62  Pulse: 84 63  Resp: 18 (!) 22  Temp: 98.7 F (37.1 C) 97.6 F (36.4 C)  SpO2: 96% 97%   Last BM Date : 05/15/24 General:   Lethargic, obese, mild acute distress, pleasant  Head: normocephalic, atraumatic Eyes: anicteric sclera ENT: oropharynx clear Neck: supple, nontender Lungs:  Clear throughout to auscultation.   No wheezes, crackles, or rhonchi. No acute distress. Heart:  Regular rate and rhythm; no murmurs, clicks, rubs,  or gallops. Abdomen: Soft nontender nondistended positive bowel sounds Rectal:  Deferred Ext: no edema  GI:  Lab Results: Recent Labs    05/15/24 2105 05/15/24 2300 05/16/24 0519 05/16/24 1309  WBC 4.5 4.5 5.0  --   HGB 9.0* 7.0* 8.0* 8.7*  HCT 31.2* 23.6* 26.5* 27.9*  PLT 318 234 231  --    BMET Recent Labs    05/15/24 0927 05/15/24 2105 05/16/24 0519  NA 139 140 140  K 4.1 4.4 4.0  CL 102 103 106  CO2 28 26 27   GLUCOSE 100* 119* 111*  BUN 14 16 16   CREATININE 0.67 0.80 0.59  CALCIUM  9.3 9.1 8.2*   LFT Recent Labs    05/15/24 2105  PROT 6.7  ALBUMIN 4.1  AST 24  ALT 14  ALKPHOS 81  BILITOT 0.4   PT/INR Recent Labs    05/16/24 0519  LABPROT 15.0  INR 1.1     Impression/Plan: GI bleed likely lower and needs a repeat stat CT angiogram.  Volume resuscitation with blood products and close following of hemoglobin.  If CT angiogram is positive for bleeding will need to repeat IR angiogram with embolization.  Colonoscopy at this time would not be helpful due to large amount of blood present in colon.  Supportive care.  NPO.   LOS: 0 days   Jerrell JAYSON Sol  05/16/2024, 4:22 PM  Questions please call [phone]

## 2024-05-16 NOTE — H&P (Signed)
 History and Physical  Brittany Middleton FMW:[id number] DOB: [birth year] DOA: 05/15/2024  PCP: Dyane Anthony RAMAN, FNP   Chief Complaint: Rectal bleeding  HPI: Brittany Middleton is a 73 y.o. female with medical history significant for adenocarcinoma of the right fallopian tube, abdominal carcinomatosis, HTN, HLD, mild intermittent asthma, hepatic steatosis, OSA on CPAP, obesity, anemia, GI bleed and a recent hospitalization for lower GI bleed now presented with rectal bleeding. Patient reports during her hospitalization in October, an EGD and colonoscopy did not find the source of her bleeding. She had follow-up with Dr. Burnette on November 13 and a capsule endoscopy was performed, currently pending results. Today around 8 PM, she started having rectal bleeding initially maroon-colored with multiple clots followed by bright red blood.  She endorses chronic fatigue but no other symptoms prior to admission. Since she presented to the ER, she has had to use the restroom 6-8 times and has noticed profuse bright red blood each time. Suddenly, she started feeling lightheaded, diaphoretic, nauseous and had 1 episode of emesis. She also endorsed mild abdominal discomfort.   ED Course: Initial vitals show patient afebrile, HR 70-80s, SBP 140s->120s->90s, SpO2 97% on room air. Initial labs significant for Hgb 9.6->9.0->7.0, iron  17, TIBC 472 iron  sat 4%, ferritin 11, vitamin B12 873, positive FOBT, normal renal function. CTA abdomen and pelvis shows focal site of acute GI hemorrhage in the mid descending colon concerning for diverticular bleed. Interventional radiology and GI were consulted for evaluation. TRH was consulted for admission. PCCM eventually called in case patient needs ICU bed after intervention.  Review of Systems: Please see HPI for pertinent positives and negatives. A complete 10 system review of systems are otherwise negative.  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 Last Assessment & Plan:  Formatting of this note might be different from the original. - continue home meds   Family history of breast cancer 01/27/2021   Family history of lung cancer 01/27/2021   Gastrointestinal bleeding 07/25/2018   Gastrointestinal hemorrhage with melena 07/16/2018   Genetic testing 02/13/2021   No pathogenic variants detected in [facility].  The report date is February 12, 2021.   The [facility] gene panel offered by [employer] includes sequencing and deletion/duplication testing of the following 48 genes: APC, ATM, AXIN2, BAP1, BARD1, BMPR1A, BRCA1, BRCA2, BRIP1, CHD1, CDK4, CDKN2A(p16 and p14ARF), CHEK2, CTNNA1, EGFR, EPCAM, FH, FLCN, GREM1, HOXB13, MEN1, M   GERD (gastroesophageal reflux disease)    Hematuria 06/19/2020   Hepatic steatosis    per imaging 2016, 2019   Hiatal hernia    History of palpitations    Holter monitor, 2017: NSR, occasional PVC, PACs, no arrhythmia   HLD (hyperlipidemia) 02/27/2018   HTN (hypertension) 02/27/2018   Hypercholesteremia    Hypercholesterolemia 04/07/2017   Hyperglycemia 06/19/2020   Hypertension  Hypertensive disorder 04/07/2017   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   Hypokalemia 07/17/2018   Lumbar disc disease    MRI, 2017    Obesity, Class III, 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   Overweight  03/03/2018   Palpitations 08/19/2015   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HCC)    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   Physical debility 01/10/2021   Pneumonia    Pure hypercholesterolemia, unspecified 04/07/2017   Pyelonephritis 03/30/2020   Recurrent UTI 05/28/2020   SBO (small bowel obstruction) (HCC) 12/28/2020   Sepsis secondary to UTI (HCC) 04/25/2020   Severe sepsis (HCC) 05/21/2020   Severe sepsis with acute organ dysfunction (HCC) 04/25/2020   Skin infection 02/05/2021   Skin irritation 02/05/2021   Sleep apnea    Urinary tract infection due to extended-spectrum beta lactamase (ESBL) producing Escherichia coli    Uterine cancer (HCC) 01/01/2021   Past Surgical History:  Procedure Laterality Date   BLADDER REPAIR     BREAST EXCISIONAL BIOPSY Left    CARPAL TUNNEL RELEASE Right    CATARACT EXTRACTION, BILATERAL  2014   COLONOSCOPY N/A 04/04/2024   Procedure: COLONOSCOPY;  Surgeon: Elicia Claw, MD;  Location: [facility] ENDOSCOPY;  Service: Gastroenterology;  Laterality: N/A;   COLONOSCOPY WITH PROPOFOL  N/A 07/18/2018   Procedure: COLONOSCOPY WITH PROPOFOL ;  Surgeon: Celestia Agent, MD;  Location: [facility] ENDOSCOPY;  Service: Endoscopy;  Laterality: N/A;   ESOPHAGOGASTRODUODENOSCOPY N/A 04/04/2024   Procedure: EGD (ESOPHAGOGASTRODUODENOSCOPY);  Surgeon: Elicia Claw, MD;  Location: [facility];  Service: Gastroenterology;  Laterality: N/A;   ESOPHAGOGASTRODUODENOSCOPY (EGD) WITH PROPOFOL  N/A 07/17/2018   Procedure: ESOPHAGOGASTRODUODENOSCOPY (EGD) WITH PROPOFOL ;  Surgeon: Celestia Agent, MD;  Location: [facility] ENDOSCOPY;  Service: Endoscopy;  Laterality: N/A;   IR REMOVAL TUN ACCESS W/ PORT W/O FL MOD SED  08/18/2021   LAPAROSCOPY N/A 12/30/2020   Procedure: LAPAROSCOPY DIAGNOSTIC; WITH  BIOPSY OF ABDOMINAL WALL NODULES AND BIOPSY OF PERICOLONIC EXUDATE;  Surgeon: Kinsinger, Herlene Righter, MD;  Location: [facility] ORS;  Service: General;  Laterality: N/A;   PORTACATH PLACEMENT N/A 01/09/2021   Procedure:  PORT INSERTION WITH US  & FLUORO;  Surgeon: Stevie Herlene Righter, MD;  Location: [facility] ORS;  Service: General;  Laterality: N/A;   REPAIR RECTOCELE     TOE SURGERY Left    Social History:  reports that she has never smoked. She has never used smokeless tobacco. She reports that she does not drink alcohol and does not use drugs.  Allergies  Allergen Reactions   Macrobid  [Nitrofurantoin ] Shortness Of Breath and Cough    Short of breath, cough, myalgia   Ciprofloxacin Other (See Comments)    Caused PAIN IN HANDS    Codeine Nausea And Vomiting   Azithromycin Palpitations    Family History  Problem Relation Age of Onset   CAD Mother    Breast cancer Mother 15   Lung cancer Father 2   Cancer Paternal Aunt        GYN cancer? bladder? d. early 43s   Cancer Paternal Aunt 76       GYN cancer   Lung cancer Paternal Grandfather        d. 62; smoking hx     Prior to Admission medications   Medication Sig Start Date End Date Taking? Authorizing Provider  acetaminophen  (TYLENOL ) 500 MG tablet Take 1,000 mg by mouth every 6 (six) hours as needed for mild pain, moderate pain, fever or headache.    [provider]  albuterol  (VENTOLIN  HFA) 108 (90 Base) MCG/ACT inhaler  Inhale 2 puffs into the lungs every 6 (six) hours as needed for wheezing or shortness of breath. 07/26/23   Kara Dorn NOVAK, MD  aspirin  EC 81 MG tablet Take 81 mg by mouth daily. Swallow whole.    [provider]  atorvastatin  (LIPITOR) 40 MG tablet Take 1 tablet (40 mg total) by mouth daily. 07/05/22   Swinyer, Rosaline HERO, NP  budesonide -formoterol  (BREYNA ) 80-4.5 MCG/ACT inhaler Inhale 2 puffs into the lungs 2 (two) times daily as needed. 07/26/23   Kara Dorn NOVAK, MD  cholecalciferol  (VITAMIN D3) 25 MCG (1000 UNIT) tablet Take 1,000 Units by mouth daily.    [provider]  clopidogrel  (PLAVIX ) 75 MG tablet Take 75 mg by mouth daily.    [provider]  co-enzyme Q-10 30 MG capsule Take 30 mg  by mouth daily. 09/17/21   [provider]  estradiol  (ESTRACE  VAGINAL) 0.1 MG/GM vaginal cream Place 1 Applicatorful vaginally at bedtime. 01/28/22   [name], MD  ezetimibe  (ZETIA ) 10 MG tablet Take 10 mg by mouth daily.    [provider]  hydrocortisone 2.5 % cream Apply 1 Application topically daily as needed. 10/14/21   [provider]  loratadine  (CLARITIN ) 10 MG tablet Take 10 mg by mouth daily.    [provider]  losartan  (COZAAR ) 50 MG tablet Take 50 mg by mouth daily. 10/02/21   [provider]  Melatonin 5 MG CHEW Chew 20 mg by mouth at bedtime.    [provider]  metFORMIN (GLUCOPHAGE) 500 MG tablet Take 500 mg by mouth 2 (two) times daily. 10/03/21   [provider]  Omega 3 1000 MG CAPS Take 1 capsule by mouth daily.    [provider]  omeprazole  (PRILOSEC) 40 MG capsule Take 40 mg by mouth every morning.    [provider]  OZEMPIC, 0.25 OR 0.5 MG/DOSE, 2 MG/3ML SOPN INJECT 0.25 MG SUBCTANEOUSLY ONCE A WEEK FOR 30 DAYS 08/23/23   [provider]  polyethylene glycol (MIRALAX  / GLYCOLAX ) 17 g packet Take 17 g by mouth daily.    [provider]  Probiotic Product (PROBIOTIC BLEND PO) Take 1 capsule by mouth daily.    [provider]  UNABLE TO FIND Take 1 capsule by mouth daily. Med Name: [place] [employer] Officer, Historical, MD    Physical Exam: BP 103/66   Pulse 70   Temp 98.6 F (37 C)   Resp 16   Ht 5' 4 (1.626 m)   Wt 104.3 kg   SpO2 100%   BMI 39.48 kg/m  General: Pleasant, well-appearing obese elderly woman laying in bed. No acute distress. HEENT: [place]/AT. Anicteric sclera. Pale conjunctiva. CV: RRR. No murmurs, rubs, or gallops. No LE edema Pulmonary: Lungs CTAB. Normal effort. No wheezing or rales. Abdominal: Soft, nontender, nondistended. Normal bowel sounds. Extremities: Palpable radial and DP pulses. Normal ROM. Skin: Warm and dry. No obvious  rash or lesions. Neuro: A&Ox3. Moves all extremities. Normal sensation to light touch. No focal deficit. Psych: Normal mood and affect          Labs on Admission:  Basic Metabolic Panel: Recent Labs  Lab 05/15/24 0927 05/15/24 2105  NA 139 140  K 4.1 4.4  CL 102 103  CO2 28 26  GLUCOSE 100* 119*  BUN 14 16  CREATININE 0.67 0.80  CALCIUM  9.3 9.1   Liver Function Tests: Recent Labs  Lab 05/15/24 0927 05/15/24 2105  AST 23 24  ALT 15 14  ALKPHOS 73 81  BILITOT 0.5 0.4  PROT 7.1 6.7  ALBUMIN 4.4 4.1   No results for input(s): LIPASE, AMYLASE in the last 168 hours. No results for input(s): AMMONIA in the last 168 hours. CBC: Recent Labs  Lab 05/15/24 0927 05/15/24 2105 05/15/24 2300  WBC 3.5* 4.5 4.5  NEUTROABS 2.4  --   --   HGB 9.6* 9.0* 7.0*  HCT 32.2* 31.2* 23.6*  MCV 85.2 88.1 88.1  PLT 297 318 234   Cardiac Enzymes: No results for input(s): CKTOTAL, CKMB, CKMBINDEX, TROPONINI in the last 168 hours. BNP (last 3 results) No results for input(s): BNP in the last 8760 hours.  ProBNP (last 3 results) No results for input(s): PROBNP in the last 8760 hours.  CBG: No results for input(s): GLUCAP in the last 168 hours.  Radiological Exams on Admission: CT Angio Abd/Pel W and/or Wo Contrast Result Date: 05/15/2024 EXAM: CTA ABDOMEN AND PELVIS WITH AND WITHOUT CONTRAST 05/15/2024 10:47:14 PM TECHNIQUE: CTA images of the abdomen and pelvis without and with intravenous contrast. Three-dimensional MIP/volume rendered formations were performed. Automated exposure control, iterative reconstruction, and/or weight based adjustment of the mA/kV was utilized to reduce the radiation dose to as low as reasonably achievable. COMPARISON: Comparison with abdominal radiograph 05/14/2024 and CT abdomen and pelvis 04/01/2024. CLINICAL HISTORY: Lower GI bleed. FINDINGS: VASCULATURE: AORTA: Minimal scattered aortic calcification. No acute finding. No abdominal  aortic aneurysm. No dissection. CELIAC TRUNK: No acute finding. No occlusion or significant stenosis. SUPERIOR MESENTERIC ARTERY: No acute finding. No occlusion or significant stenosis. RENAL ARTERIES: No acute finding. No occlusion or significant stenosis. ILIAC ARTERIES: No acute finding. No occlusion or significant stenosis. LIVER: The liver is unremarkable. GALLBLADDER AND BILE DUCTS: Gallbladder is unremarkable. No biliary ductal dilatation. SPLEEN: The spleen is unremarkable. PANCREAS: The pancreas is unremarkable. ADRENAL GLANDS: Bilateral adrenal glands demonstrate no acute abnormality. KIDNEYS, URETERS AND BLADDER: No stones in the kidneys or ureters. No hydronephrosis. No perinephric or periureteral stranding. Urinary bladder is unremarkable. GI AND BOWEL: Small periumbilical hernia with broad base containing small bowel and colon but without proximal obstruction. Diffusely stool-filled colon. No small or large bowel distention. Colonic diverticula without evidence of acute diverticulitis. In the mid descending colon, there is a focal area of intraluminal contrast extravasation on the arterial phase with progressive pooling on the delayed phase. No corresponding density is demonstrated on the unenhanced images. This is consistent with a focal site of acute gastrointestinal hemorrhage. There is a diverticulum adjacent to the area of hemorrhage, suggesting that this is most likely to represent diverticular bleed , less likely due to an occult mass. REPRODUCTIVE: The uterus is surgically absent. PERITONEUM AND RETRPERITONEUM: No ascites or free air. LUNG BASE: Lung bases are clear. LYMPH NODES: No lymphadenopathy. BONES AND SOFT TISSUES: Degenerative changes in the spine. Scarring along the midline consistent with postoperative change. No acute abnormality of the bones. No acute soft tissue abnormality. Critical results/urgent findings were called at 10:56 pm on 05/15/2024 by radiologist W. Okey Gravely, MD  to Dr. Jerilynn Later. IMPRESSION: 1. Focal site of acute gastrointestinal hemorrhage in the mid descending colon, with intraluminal contrast extravasation on arterial phase and progressive pooling on delayed phase. Etiology is indeterminate but most likely to represent diverticular bleed. 2. Small periumbilical hernia containing small bowel and colon without proximal obstruction. Electronically signed by: Elsie Gravely MD 05/15/2024 11:02 PM EST RP Workstation: [id number]   Assessment/Plan Samiksha Pellicano is a 73 y.o. female  with medical history significant for adenocarcinoma of the right fallopian tube, abdominal carcinomatosis, HTN, HLD, mild intermittent asthma, hepatic steatosis, OSA on CPAP, obesity, anemia, GI bleed and a recent hospitalization for lower GI bleed now presented with rectal bleeding and admitted for GI bleed.  # Active GI bleed # Acute blood loss anemia - Patient with history of GI bleed presented with acute onset of profuse rectal bleeding - Patient with ~ 3 g drop in hemoglobin from 9.6 at oncology office to 7.0 in < 14 hrs  - CTA abdomen pelvis on admission shows a focal site of acute GI hemorrhage in the mid descending colon most likely from diverticular bleed - Patient with profuse rectal bleeding while in the ED with significant drop in SBP from 140 to the 90-100s - Emergent 2 units PRBC started by ED provider - Interventional radiology consulted for embolization - GI consulted, appreciate further evaluation - Follow-up with transfusion H&H and continue to trend H&H  # Iron  deficiency anemia - Ongoing problem, followed by oncology in the outpatient - Repeat labs earlier this morning shows iron  low at 17, TIBC 472, iron  sat 4% and ferritin 11 - Patient will require IV iron  infusion during this hospitalization  # HTN - BP dropping in the setting of active GI bleed - Continue to monitor closely  # CAD - Hold Plavix  and aspirin  in the setting of active GI  bleed  # HLD - Resume meds after completion of med rec  # GERD - Start IV PPI  # OSA - CPAP at bedtime  # Mild intermittent asthma - Chronic and stable - As needed DuoNebs  # Hx of 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 Filed Weights   05/15/24 2050  Weight: 104.3 kg  - F/u with PCP for weight lost and nutrition counseling  DVT prophylaxis: SCDs    Code Status: Full Code  Consults called: IR, GI, PCCM  Family Communication: No family at bedside  Severity of Illness: The appropriate patient status for this patient is INPATIENT. Inpatient status is judged to be reasonable and necessary in order to provide the required intensity of service to ensure the patient's safety. The patient's presenting symptoms, physical exam findings, and initial radiographic and laboratory data in the context of their chronic comorbidities is felt to place them at high risk for further clinical deterioration. Furthermore, it is not anticipated that the patient will be medically stable for discharge from the hospital within 2 midnights of admission.   * I certify that at the point of admission it is my clinical judgment that the patient will require inpatient hospital care spanning beyond 2 midnights from the point of admission due to high intensity of service, high risk for further deterioration and high frequency of surveillance required.*  Level of care: Progressive   I personally spent a total of 75 minutes in the care of the patient today including preparing to see the patient, getting/reviewing separately obtained history, performing a medically appropriate exam/evaluation, placing orders, and documenting clinical information in the EHR.   Lou Claretta HERO, MD 05/16/2024, 1:56  AM [facility] Pager: [phone] Isaiah 41:10   If 7PM-7AM, please contact night-coverage [email or web address] Password [email or web address]

## 2024-05-16 NOTE — Progress Notes (Signed)
   05/16/24 2045  BiPAP/CPAP/SIPAP  BiPAP/CPAP/SIPAP Pt Type Adult  Reason BIPAP/CPAP not in use Non-compliant (Patient stated that she does not want to wear a CPAP tonight and usually has difficulty falling asleep while wearing one. RT advised to call if she changes her mind.)  BiPAP/CPAP /SiPAP Vitals  Pulse Rate 97  Resp 15  BP 136/66  SpO2 93 %  MEWS Score/Color  MEWS Score 0  MEWS Score Color Green

## 2024-05-16 NOTE — Consult Note (Signed)
 Chief Complaint: Patient was seen in consultation today for acute lower gastrointestinal hemorrhage  Referring Physician(s):  Dr. Rogelia  Patient Status: [facility] - ED  History of Present Illness: Brittany Middleton is a 73 y.o. female with pertinent history of diverticulosis with prior indeterminate gastrointestinal hemorrhage without intervention as well as right coronary artery aneurysm on aspirin  and plavix  who presents to the [facility] ED with bright red blood per rectum.  She has had several episodes tonight including in the ED with associated acute anemia.  No loss of consciousness.  No abdominal pain.  She is receiving blood transfusion in the ED.  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 Last Assessment & Plan:  Formatting of this note might be different from the original. - continue home meds   Family history of breast cancer 01/27/2021   Family history of lung cancer 01/27/2021   Gastrointestinal bleeding 07/25/2018   Gastrointestinal hemorrhage with melena 07/16/2018   Genetic testing 02/13/2021   No pathogenic variants detected in [facility].  The report date is February 12, 2021.   The [facility] gene panel offered by [facility] includes sequencing and deletion/duplication testing of the following 48 genes: APC, ATM, AXIN2, BAP1, BARD1,  BMPR1A, BRCA1, BRCA2, BRIP1, CHD1, CDK4, CDKN2A(p16 and p14ARF), CHEK2, CTNNA1, EGFR, EPCAM, FH, FLCN, GREM1, HOXB13, MEN1, M   GERD (gastroesophageal reflux disease)    Hematuria 06/19/2020   Hepatic steatosis    per imaging 2016, 2019   Hiatal hernia    History of palpitations    Holter monitor, 2017: NSR, occasional PVC, PACs, no arrhythmia   HLD (hyperlipidemia) 02/27/2018   HTN (hypertension) 02/27/2018   Hypercholesteremia    Hypercholesterolemia 04/07/2017   Hyperglycemia 06/19/2020   Hypertension    Hypertensive disorder 04/07/2017   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   Hypokalemia 07/17/2018   Lumbar disc disease    MRI, 2017    Obesity, Class III, 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   Overweight 03/03/2018   Palpitations 08/19/2015   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HCC)    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   Physical debility 01/10/2021   Pneumonia    Pure hypercholesterolemia, unspecified 04/07/2017   Pyelonephritis 03/30/2020   Recurrent UTI 05/28/2020   SBO (small bowel obstruction) (HCC) 12/28/2020   Sepsis secondary to UTI (HCC) 04/25/2020   Severe sepsis (HCC) 05/21/2020   Severe sepsis with acute organ dysfunction (HCC) 04/25/2020   Skin infection 02/05/2021   Skin irritation 02/05/2021   Sleep apnea    Urinary tract infection due to extended-spectrum beta lactamase (ESBL) producing Escherichia coli    Uterine cancer (HCC) 01/01/2021    Past Surgical History:  Procedure Laterality Date   BLADDER REPAIR     BREAST EXCISIONAL BIOPSY Left    CARPAL TUNNEL RELEASE Right    CATARACT EXTRACTION,  BILATERAL  2014   COLONOSCOPY N/A 04/04/2024   Procedure: COLONOSCOPY;  Surgeon: Elicia Claw, MD;  Location: [facility] ENDOSCOPY;  Service: Gastroenterology;  Laterality: N/A;   COLONOSCOPY WITH PROPOFOL  N/A 07/18/2018   Procedure: COLONOSCOPY WITH PROPOFOL ;  Surgeon: Celestia Agent, MD;  Location: [facility] ENDOSCOPY;  Service: Endoscopy;  Laterality: N/A;    ESOPHAGOGASTRODUODENOSCOPY N/A 04/04/2024   Procedure: EGD (ESOPHAGOGASTRODUODENOSCOPY);  Surgeon: Elicia Claw, MD;  Location: [facility];  Service: Gastroenterology;  Laterality: N/A;   ESOPHAGOGASTRODUODENOSCOPY (EGD) WITH PROPOFOL  N/A 07/17/2018   Procedure: ESOPHAGOGASTRODUODENOSCOPY (EGD) WITH PROPOFOL ;  Surgeon: Celestia Agent, MD;  Location: [facility] ENDOSCOPY;  Service: Endoscopy;  Laterality: N/A;   IR REMOVAL TUN ACCESS W/ PORT W/O FL MOD SED  08/18/2021   LAPAROSCOPY N/A 12/30/2020   Procedure: LAPAROSCOPY DIAGNOSTIC; WITH  BIOPSY OF ABDOMINAL WALL NODULES AND BIOPSY OF PERICOLONIC EXUDATE;  Surgeon: Kinsinger, Herlene Righter, MD;  Location: [facility] ORS;  Service: General;  Laterality: N/A;   PORTACATH PLACEMENT N/A 01/09/2021   Procedure: PORT INSERTION WITH US  & FLUORO;  Surgeon: Stevie Herlene Righter, MD;  Location: [facility] ORS;  Service: General;  Laterality: N/A;   REPAIR RECTOCELE     TOE SURGERY Left     Allergies: Macrobid  [nitrofurantoin ], Ciprofloxacin, Codeine, and Azithromycin  Medications: Prior to Admission medications   Medication Sig Start Date End Date Taking? Authorizing Provider  acetaminophen  (TYLENOL ) 500 MG tablet Take 1,000 mg by mouth every 6 (six) hours as needed for mild pain, moderate pain, fever or headache.    [provider]  albuterol  (VENTOLIN  HFA) 108 (90 Base) MCG/ACT inhaler Inhale 2 puffs into the lungs every 6 (six) hours as needed for wheezing or shortness of breath. 07/26/23   Kara Dorn NOVAK, MD  aspirin  EC 81 MG tablet Take 81 mg by mouth daily. Swallow whole.    [provider]  atorvastatin  (LIPITOR) 40 MG tablet Take 1 tablet (40 mg total) by mouth daily. 07/05/22   Swinyer, Rosaline HERO, NP  budesonide -formoterol  (BREYNA ) 80-4.5 MCG/ACT inhaler Inhale 2 puffs into the lungs 2 (two) times daily as needed. 07/26/23   Kara Dorn NOVAK, MD  cholecalciferol  (VITAMIN D3) 25 MCG (1000 UNIT) tablet Take 1,000 Units by mouth daily.    [provider]  clopidogrel  (PLAVIX ) 75 MG tablet Take 75 mg by mouth daily.    [provider]  co-enzyme Q-10 30 MG capsule Take 30 mg by mouth daily. 09/17/21   [provider]  estradiol  (ESTRACE  VAGINAL) 0.1 MG/GM vaginal cream Place 1 Applicatorful vaginally at bedtime. 01/28/22   [name], MD  ezetimibe  (ZETIA ) 10 MG tablet Take 10 mg by mouth daily.    [provider]  hydrocortisone 2.5 % cream Apply 1 Application topically daily as needed. 10/14/21   [provider]  loratadine  (CLARITIN ) 10 MG tablet Take 10 mg by mouth daily.    [provider]  losartan  (COZAAR ) 50 MG tablet Take 50 mg by mouth daily. 10/02/21   [provider]  Melatonin 5 MG CHEW Chew 20 mg by mouth at bedtime.    [provider]  metFORMIN (GLUCOPHAGE) 500 MG tablet Take 500 mg by mouth 2 (two) times daily. 10/03/21   [provider]  Omega 3 1000 MG CAPS Take 1 capsule by mouth daily.    [provider]  omeprazole  (PRILOSEC) 40 MG capsule Take 40 mg by mouth every morning.    [provider]  OZEMPIC, 0.25 OR 0.5 MG/DOSE, 2 MG/3ML SOPN  INJECT 0.25 MG SUBCTANEOUSLY ONCE A WEEK FOR 30 DAYS 08/23/23   [provider]  polyethylene glycol (MIRALAX  / GLYCOLAX ) 17 g packet Take 17 g by mouth daily.    [provider]  Probiotic Product (PROBIOTIC BLEND PO) Take 1 capsule by mouth daily.    [provider]  UNABLE TO FIND Take 1 capsule by mouth daily. Med Name: [place] Geographical Information Systems Officer, Historical, MD     Family History  Problem Relation Age of Onset   CAD Mother    Breast cancer Mother 19   Lung cancer Father 1   Cancer Paternal Aunt        GYN cancer? bladder? d. early 3s   Cancer Paternal Aunt 15       GYN cancer   Lung cancer Paternal Grandfather        d. 78; smoking hx    Social History   Socioeconomic History   Marital status: Divorced    Spouse name: Not on file   Number of  children: 2   Years of education: Not on file   Highest education level: Master's degree (e.g., MA, MS, MEng, MEd, MSW, MBA)  Occupational History   Occupation: Retired  Tobacco Use   Smoking status: Never   Smokeless tobacco: Never  Vaping Use   Vaping status: Never Used  Substance and Sexual Activity   Alcohol use: No   Drug use: No   Sexual activity: Not Currently    Birth control/protection: Post-menopausal  Other Topics Concern   Not on file  Social History Narrative   Not on file   Social Drivers of Health   Financial Resource Strain: Low Risk  (02/27/2018)   Overall Financial Resource Strain (CARDIA)    Difficulty of Paying Living Expenses: Not hard at all  Food Insecurity: No Food Insecurity (04/01/2024)   Hunger Vital Sign    Worried About Running Out of Food in the Last Year: Never true    Ran Out of Food in the Last Year: Never true  Transportation Needs: No Transportation Needs (04/01/2024)   PRAPARE - Administrator, Civil Service (Medical): No    Lack of Transportation (Non-Medical): No  Physical Activity: Sufficiently Active (02/27/2018)   Exercise Vital Sign    Days of Exercise per Week: 7 days    Minutes of Exercise per Session: 60 min  Stress: No Stress Concern Present (02/27/2018)   [employer] of Occupational Health - Occupational Stress Questionnaire    Feeling of Stress : Only a little  Social Connections: Moderately Isolated (04/01/2024)   Social Connection and Isolation Panel    Frequency of Communication with Friends and Family: More than three times a week    Frequency of Social Gatherings with Friends and Family: More than three times a week    Attends Religious Services: Never    Database Administrator or Organizations: Yes    Attends Banker Meetings: 1 to 4 times per year    Marital Status: Divorced     Review of Systems: A 12 point ROS discussed and pertinent positives are indicated in the HPI above.  All other  systems are negative.  Vital Signs: BP 103/66   Pulse 70   Temp 98.6 F (37 C)   Resp 16   Ht 5' 4 (1.626 m)   Wt 104.3 kg   SpO2 100%   BMI 39.48 kg/m   Advance Care Plan: The advanced care  plan/surrogate decision maker was discussed at the time of visit and documented in the medical record.    Physical Exam Constitutional:      General: She is not in acute distress. HENT:     Head: Normocephalic.     Nose: Nose normal.     Mouth/Throat:     Comments: MP2 Eyes:     General: No scleral icterus. Cardiovascular:     Rate and Rhythm: Normal rate and regular rhythm.  Pulmonary:     Effort: No respiratory distress.  Abdominal:     General: There is no distension.  Musculoskeletal:     Right lower leg: No edema.     Left lower leg: No edema.  Skin:    General: Skin is warm and dry.     Coloration: Skin is not jaundiced.  Neurological:     Mental Status: She is alert and oriented to person, place, and time.     Imaging: CTA AP 05/15/24    Labs:  CBC: Recent Labs    04/04/24 0212 04/04/24 1245 05/15/24 0927 05/15/24 2105 05/15/24 2300  WBC 3.8*  --  3.5* 4.5 4.5  HGB 8.0* 7.7* 9.6* 9.0* 7.0*  HCT 25.3* 24.6* 32.2* 31.2* 23.6*  PLT 196  --  297 318 234    COAGS: No results for input(s): INR, APTT in the last 8760 hours.  BMP: Recent Labs    04/02/24 0255 04/04/24 0212 05/15/24 0927 05/15/24 2105  NA 142 143 139 140  K 4.5 3.9 4.1 4.4  CL 109 105 102 103  CO2 28 27 28 26   GLUCOSE 97 92 100* 119*  BUN 10 8 14 16   CALCIUM  8.4* 8.9 9.3 9.1  CREATININE 0.53 0.60 0.67 0.80  GFRNONAA >60 >60 >60 >60    LIVER FUNCTION TESTS: Recent Labs    03/31/24 2338 04/04/24 0212 05/15/24 0927 05/15/24 2105  BILITOT 0.5 0.5 0.5 0.4  AST 24 23 23 24   ALT 18 15 15 14   ALKPHOS 71 48 73 81  PROT 6.5 5.5* 7.1 6.7  ALBUMIN 4.2 3.6 4.4 4.1    TUMOR MARKERS: No results for input(s): AFPTM, CEA, CA199, CHROMGRNA in the last 8760  hours.  Assessment and Plan: 73 year old female with history of diverticulosis presenting with acute lower gastrointestinal hemorrhage, likely diverticular, from the descending colon with associated acute anemia.  Risks and benefits of mesenteric angiogram with possible embolization were discussed with the patient including, but not limited to bleeding, infection, vascular injury or contrast induced renal failure.  All of the patient's questions were answered, patient is agreeable to proceed.  Consent signed and in chart.    Electronically Signed: Ester JINNY Sides, MD 05/16/2024, 1:46 AM   I spent a total of 20 Minutes in face to face in clinical consultation, greater than 50% of which was counseling/coordinating care for acute lower GI bleed.

## 2024-05-16 NOTE — Progress Notes (Signed)
 Patient called me to the room and upon helping her to the bathroom noted large amount of bright red blood with clots, followed by dizziness. Helped patient back to bed and notified MD Rojelio. Blood was ordered and started at 120 ml/hr & MD Rojelio came to bedside due to reported dizziness.The patient said she felt winded, out of breath and dizzy but VS stable at that time.   Ten minutes later she had 2 more large bloody stools on BSC. After this she became pale, diaphoretic, short of breath, and nauseated and I returned her to bed and put her in trendelenburg. Rapid RN Omega called and instructed me to turn blood up to 212ml/hr and he came to bedside. We transferred pt to ICU stepdown + bedside report given to [facility].

## 2024-05-16 NOTE — Procedures (Signed)
 Interventional Radiology Procedure Note  Procedure: Mesenteric angiogram  Findings: Please refer to procedural dictation for full description. 5 Fr access right common femoral artery, closed with 6 Fr Angioseal.  No active extravasation visualized throughout the IMA territory in the descending colon. No embolization performed.  Complications: None immediate  Estimated Blood Loss: < 5 ml  Recommendations: 3 hours bedrest with 1 hour flat followed by 2 hours with head of bed up to 30 degrees. Please notify IR with any new hemorrhagic shock as repeat angiogram could be considered. IR will follow.   Ester Sides, MD

## 2024-05-16 NOTE — Plan of Care (Signed)

## 2024-05-17 DIAGNOSIS — K922 Gastrointestinal hemorrhage, unspecified: Secondary | ICD-10-CM | POA: Diagnosis not present

## 2024-05-17 LAB — HEMOGLOBIN AND HEMATOCRIT, BLOOD
HCT: 30.3 % — ABNORMAL LOW (ref 36.0–46.0)
HCT: 30.1 % — ABNORMAL LOW (ref 36.0–46.0)
Hemoglobin: 9.8 g/dL — ABNORMAL LOW (ref 12.0–15.0)
Hemoglobin: 9.8 g/dL — ABNORMAL LOW (ref 12.0–15.0)

## 2024-05-17 MED ORDER — ATORVASTATIN CALCIUM 40 MG PO TABS
40.0000 mg | ORAL_TABLET | Freq: Every day | ORAL | Status: DC
  Administered 2024-05-17 – 2024-05-20 (×4): 40 mg via ORAL
  Filled 2024-05-17 (×4): qty 1

## 2024-05-17 MED ORDER — EZETIMIBE 10 MG PO TABS
10.0000 mg | ORAL_TABLET | Freq: Every day | ORAL | Status: DC
  Administered 2024-05-17 – 2024-05-20 (×4): 10 mg via ORAL
  Filled 2024-05-17 (×4): qty 1

## 2024-05-17 MED ORDER — LOSARTAN POTASSIUM 50 MG PO TABS
50.0000 mg | ORAL_TABLET | Freq: Every day | ORAL | Status: DC
  Administered 2024-05-17 – 2024-05-20 (×4): 50 mg via ORAL
  Filled 2024-05-17 (×4): qty 1

## 2024-05-17 NOTE — Plan of Care (Signed)
  Problem: Education: Goal: Knowledge of General Education information will improve Description: Including pain rating scale, medication(s)/side effects and non-pharmacologic comfort measures Outcome: Progressing   Problem: Health Behavior/Discharge Planning: Goal: Ability to manage health-related needs will improve Outcome: Progressing   Problem: Clinical Measurements: Goal: Diagnostic test results will improve Outcome: Progressing   Problem: Clinical Measurements: Goal: Ability to maintain clinical measurements within normal limits will improve Outcome: Progressing   Problem: Clinical Measurements: Goal: Respiratory complications will improve Outcome: Progressing   Problem: Clinical Measurements: Goal: Cardiovascular complication will be avoided Outcome: Progressing   Problem: Activity: Goal: Risk for activity intolerance will decrease Outcome: Progressing   Problem: Nutrition: Goal: Adequate nutrition will be maintained Outcome: Progressing   Problem: Safety: Goal: Ability to remain free from injury will improve Outcome: Progressing   Problem: Education: Goal: Understanding of CV disease, CV risk reduction, and recovery process will improve Outcome: Progressing Goal: Individualized Educational Video(s) Outcome: Progressing   Problem: Activity: Goal: Ability to return to baseline activity level will improve Outcome: Progressing

## 2024-05-17 NOTE — Plan of Care (Signed)
  Problem: Education: Goal: Knowledge of General Education information will improve Description: Including pain rating scale, medication(s)/side effects and non-pharmacologic comfort measures Outcome: Progressing   Problem: Health Behavior/Discharge Planning: Goal: Ability to manage health-related needs will improve Outcome: Progressing   Problem: Clinical Measurements: Goal: Ability to maintain clinical measurements within normal limits will improve Outcome: Progressing   Problem: Skin Integrity: Goal: Risk for impaired skin integrity will decrease Outcome: Progressing   Problem: Education: Goal: Understanding of CV disease, CV risk reduction, and recovery process will improve Outcome: Progressing Goal: Individualized Educational Video(s) Outcome: Progressing

## 2024-05-17 NOTE — Progress Notes (Signed)
 PROGRESS NOTE    Brittany Middleton  FMW:[id number] DOB: Dec 05, 1950 DOA: 05/15/2024 PCP: Dyane Anthony RAMAN, FNP     Brief Narrative:  Brittany Middleton is a 73 y.o. female with medical history significant for adenocarcinoma of the right fallopian tube, abdominal carcinomatosis, HTN, HLD, mild intermittent asthma, hepatic steatosis, OSA on CPAP, obesity, anemia, GI bleed and a recent hospitalization for lower GI bleed now presented with rectal bleeding. Patient reports during her hospitalization in October, an EGD and colonoscopy did not find the source of her bleeding. She had follow-up with Dr. Burnette on November 13 and a capsule endoscopy was performed, currently pending results.  She presented again with rectal bleeding, had numerous hematochezia episodes in the emergency department along with lightheadedness, diaphoresis, nausea and vomiting.  In the emergency department, patient was found to have hemoglobin 7.0, positive FOBT, iron  deficient. CTA abdomen and pelvis shows focal site of acute GI hemorrhage in the mid descending colon concerning for diverticular bleed. Interventional radiology and GI were consulted for evaluation.  PCCM also consulted.  Patient underwent IR evaluation, no embolization performed as no active extravasation visualized.  New events last 24 hours / Subjective: Had significant blood per rectum throughout the day yesterday.  Repeat CT angio was performed and patient ultimately underwent IR evaluation for embolization overnight.  This morning, patient is feeling much better.  Admits to some fatigue.  No significant bleeding since last night.  Assessment & Plan:  Principal Problem:   GI bleed Active Problems:   Chronic asthma   OSA on CPAP   Essential hypertension   Diverticular hemorrhage   Obesity, Class II, BMI 35-39.9   History of ovarian cancer   History of fallopian tube cancer   Lower GI bleed Symptomatic anemia Iron  deficiency anemia - Appreciate GI,  IR - Status post embolization of distal branch of left colic artery by Dr. Karalee 11/26 - Status post total 5 unit packed red blood cell transfusion since admission - Status post IV iron  infusion - PPI - Advance to clear liquid diet today  Hypertension - Resume home losartan   CAD - Aspirin  and Plavix  currently on hold  Hyperlipidemia - Lipitor, Zetia   OSA - CPAP nightly  History of GYN cancer - Follow-up with Dr. Lonn  DVT prophylaxis:  SCDs Start: 05/16/24 0131  Code Status: Full code Family Communication: Son at bedside Disposition Plan: Home Status is: Inpatient Remains inpatient appropriate because: Monitor postop    Antimicrobials:  Anti-infectives (From admission, onward)    None        Objective: Vitals:   05/17/24 0900 05/17/24 1000 05/17/24 1100 05/17/24 1158  BP: (!) 163/138 (!) 157/60 (!) 175/55 (!) 162/70  Pulse: 97 (!) 105 99 89  Resp: 13 18 19 16   Temp:    98.2 F (36.8 C)  TempSrc:    Oral  SpO2: 94% 97% 94% 96%  Weight:      Height:        Intake/Output Summary (Last 24 hours) at 05/17/2024 1232 Last data filed at 05/17/2024 0730 Gross per 24 hour  Intake 1228.67 ml  Output 1400 ml  Net -171.33 ml   Filed Weights   05/15/24 2050 05/16/24 0254  Weight: 104.3 kg 108.9 kg    Examination:  General exam: Appears calm and comfortable  Respiratory system: Clear to auscultation. Respiratory effort normal. No respiratory distress. No conversational dyspnea.  Cardiovascular system: S1 & S2 heard, RRR. No murmurs. No pedal edema. Gastrointestinal system: Abdomen is nondistended,  soft and nontender. Normal bowel sounds heard. Central nervous system: Alert and oriented. No focal neurological deficits. Speech clear.  Extremities: Symmetric in appearance  Skin: No rashes, lesions or ulcers on exposed skin  Psychiatry: Judgement and insight appear normal. Mood & affect appropriate.   Data Reviewed: I have personally reviewed  following labs and imaging studies  CBC: Recent Labs  Lab 05/15/24 0927 05/15/24 2105 05/15/24 2300 05/16/24 0519 05/16/24 1309 05/16/24 1830 05/17/24 0351 05/17/24 0656  WBC 3.5* 4.5 4.5 5.0  --  8.6  --   --   NEUTROABS 2.4  --   --   --   --   --   --   --   HGB 9.6* 9.0* 7.0* 8.0* 8.7* 7.9* 9.8* 9.8*  HCT 32.2* 31.2* 23.6* 26.5* 27.9* 25.1* 30.3* 30.1*  MCV 85.2 88.1 88.1 87.2  --  85.7  --   --   PLT 297 318 234 231  --  202  --   --    Basic Metabolic Panel: Recent Labs  Lab 05/15/24 0927 05/15/24 2105 05/16/24 0519  NA 139 140 140  K 4.1 4.4 4.0  CL 102 103 106  CO2 28 26 27   GLUCOSE 100* 119* 111*  BUN 14 16 16   CREATININE 0.67 0.80 0.59  CALCIUM  9.3 9.1 8.2*   GFR: Estimated Creatinine Clearance: 77.8 mL/min (by C-G formula based on SCr of 0.59 mg/dL). Liver Function Tests: Recent Labs  Lab 05/15/24 0927 05/15/24 2105  AST 23 24  ALT 15 14  ALKPHOS 73 81  BILITOT 0.5 0.4  PROT 7.1 6.7  ALBUMIN 4.4 4.1   No results for input(s): LIPASE, AMYLASE in the last 168 hours. No results for input(s): AMMONIA in the last 168 hours. Coagulation Profile: Recent Labs  Lab 05/16/24 0519  INR 1.1   Cardiac Enzymes: No results for input(s): CKTOTAL, CKMB, CKMBINDEX, TROPONINI in the last 168 hours. BNP (last 3 results) No results for input(s): PROBNP in the last 8760 hours. HbA1C: No results for input(s): HGBA1C in the last 72 hours. CBG: No results for input(s): GLUCAP in the last 168 hours. Lipid Profile: No results for input(s): CHOL, HDL, LDLCALC, TRIG, CHOLHDL, LDLDIRECT in the last 72 hours. Thyroid  Function Tests: Recent Labs    05/15/24 0927  TSH 1.360   Anemia Panel: Recent Labs    05/15/24 0927  VITAMINB12 873  FERRITIN 11  TIBC 472*  IRON  17*  RETICCTPCT 1.7   Sepsis Labs: No results for input(s): PROCALCITON, LATICACIDVEN in the last 168 hours.  Recent Results (from the past 240 hours)  MRSA  Next Gen by PCR, Nasal     Status: None   Collection Time: 05/16/24  4:18 PM   Specimen: Nasal Mucosa; Nasal Swab  Result Value Ref Range Status   MRSA by PCR Next Gen NOT DETECTED NOT DETECTED Final    Comment: (NOTE) The GeneXpert MRSA Assay (FDA approved for NASAL specimens only), is one component of a comprehensive MRSA colonization surveillance program. It is not intended to diagnose MRSA infection nor to guide or monitor treatment for MRSA infections. Test performance is not FDA approved in patients less than 31 years old. Performed at [facility], [address] [place], [place] [address]       Radiology Studies: IR Angiogram Visceral Selective Result Date: 05/17/2024 INDICATION: 73 year old female with ongoing acute lower GI bleed. CT arteriography was positive late yesterday and patient underwent angiography earlier this morning which was  negative for evidence of active bleeding. Patient then did well throughout much of the day before developing recurrent bright red blood per rectum and need for additional transfusion this afternoon. CT arteriography was repeated and confirmed recurrent bleeding in the mid descending colon. Therefore, patient presents for repeat arteriography and embolization. EXAM: SELECTIVE VISCERAL ARTERIOGRAPHY; ADDITIONAL ARTERIOGRAPHY; IR EMBO ART VEN HEMORR LYMPH EXTRAV INC GUIDE ROADMAPPING; IR ULTRASOUND GUIDANCE VASC ACCESS LEFT MEDICATIONS: 5000 units of heparin  was administered intravenously by the Radiology nurse. 4 mg tPA and 200 mcg nitroglycerin  were administered intra-arterially by me. ANESTHESIA/SEDATION: Moderate (conscious) sedation was employed during this procedure. A total of Versed  2 mg and Fentanyl  200 mcg was administered intravenously by the Radiology nurse. Moderate Sedation Time: 98 minutes. The patient's level of consciousness and vital signs were monitored continuously by radiology nursing throughout the procedure under  my direct supervision. CONTRAST:  50mL OMNIPAQUE  IOHEXOL  300 MG/ML SOLN, 40mL OMNIPAQUE  IOHEXOL  300 MG/ML SOLN, 50mL OMNIPAQUE  IOHEXOL  300 MG/ML SOLN FLUOROSCOPY: Radiation Exposure Index (as provided by the fluoroscopic device): 2684 mGy Kerma COMPLICATIONS: SIR Level A - No therapy, no consequence. PROCEDURE: Informed consent was obtained from the patient following explanation of the procedure, risks, benefits and alternatives. The patient understands, agrees and consents for the procedure. All questions were addressed. A time out was performed prior to the initiation of the procedure. Maximal barrier sterile technique utilized including caps, mask, sterile gowns, sterile gloves, large sterile drape, hand hygiene, and Betadine prep. The left common femoral artery was interrogated with ultrasound and found to be widely patent. An image was obtained and stored for the medical record. Local anesthesia was attained by infiltration with 1% lidocaine . A small dermatotomy was made. Under real-time sonographic guidance, the vessel was punctured with a 21 gauge micropuncture needle. Using standard technique, the initial micro needle was exchanged over a 0.018 micro wire for a transitional 4 French micro sheath. The micro sheath was then exchanged over a 0.035 wire for a 5 French vascular sheath. Unfortunately, the department is out of RIM catheters. Therefore a Sos Omni selective catheter was advanced over a Bentson wire into the abdominal aorta and formed. The catheter was used to select the origin of the IMA and arteriography was performed. No active bleeding on the initial angiogram. Given the clearly defined location of bleeding in the mid descending artery, a Cook cantata 2.5 French microcatheter was therefore used to select the left colic artery and arteriography was again performed. Again, no active bleeding. The microcatheter was further advanced into the descending branch of the left colic artery and arteriography  was performed. No evidence of active bleeding. The microcatheter was pulled back and advanced into the ascending branch of the left colic artery and arteriography was performed. No evidence of active bleeding. Anatomic leak, the region of colon most corresponding to the recent visualized bleed on CT arteriography is supplied by the descending branch of the left colic artery. Therefore, the microcatheter was again advanced into the descending branch of the left colic artery and advanced distally just proximal to its bifurcation into the many Vasa recta branches. At this time, heparin  was administered intravenously. TPA was prepared and 4 mg of tPA was administered through the catheter followed by a total of 200 mcg of nitroglycerin . After several minutes additional arteriography was performed. This time, active bleeding is visualized from 1 of the tortuous distal branches. The microcatheter was used to select several of the small branches in till the active bleeding was successfully  visualized. At this time, coil embolization was performed using a series of detachable penumbra microcoils. Follow-up arteriography demonstrates no further active bleeding. The microcatheter was removed. Contrast was injected through the base catheter at the origin of the IMA. Unfortunately, this resulted in a small iatrogenic dissection of the proximal IMA. The catheter was moved and repeat injection performed gingerly. The dissection is not flow limiting. This is likely of no consequence and should heal without any further intervention. IMPRESSION: 1. Successful provocative mesenteric angiogram with recreation and visualization of the site of bleeding. 2. Successful coil embolization of distal branches of the descending branch of the left colic artery with cessation of bleeding. 3. 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 This is likely of no consequence and should heal without any further intervention.  Electronically Signed   By: Wilkie Lent M.D.   On: 05/17/2024 07:29   IR US  Guide Vasc Access Left Result Date: 05/17/2024 INDICATION: 73 year old female with ongoing acute lower GI bleed. CT arteriography was positive late yesterday and patient underwent angiography earlier this morning which was negative for evidence of active bleeding. Patient then did well throughout much of the day before developing recurrent bright red blood per rectum and need for additional transfusion this afternoon. CT arteriography was repeated and confirmed recurrent bleeding in the mid descending colon. Therefore, patient presents for repeat arteriography and embolization. EXAM: SELECTIVE VISCERAL ARTERIOGRAPHY; ADDITIONAL ARTERIOGRAPHY; IR EMBO ART VEN HEMORR LYMPH EXTRAV INC GUIDE ROADMAPPING; IR ULTRASOUND GUIDANCE VASC ACCESS LEFT MEDICATIONS: 5000 units of heparin  was administered intravenously by the Radiology nurse. 4 mg tPA and 200 mcg nitroglycerin  were administered intra-arterially by me. ANESTHESIA/SEDATION: Moderate (conscious) sedation was employed during this procedure. A total of Versed  2 mg and Fentanyl  200 mcg was administered intravenously by the Radiology nurse. Moderate Sedation Time: 98 minutes. The patient's level of consciousness and vital signs were monitored continuously by radiology nursing throughout the procedure under my direct supervision. CONTRAST:  50mL OMNIPAQUE  IOHEXOL  300 MG/ML SOLN, 40mL OMNIPAQUE  IOHEXOL  300 MG/ML SOLN, 50mL OMNIPAQUE  IOHEXOL  300 MG/ML SOLN FLUOROSCOPY: Radiation Exposure Index (as provided by the fluoroscopic device): 2684 mGy Kerma COMPLICATIONS: SIR Level A - No therapy, no consequence. PROCEDURE: Informed consent was obtained from the patient following explanation of the procedure, risks, benefits and alternatives. The patient understands, agrees and consents for the procedure. All questions were addressed. A time out was performed prior to the initiation of the  procedure. Maximal barrier sterile technique utilized including caps, mask, sterile gowns, sterile gloves, large sterile drape, hand hygiene, and Betadine prep. The left common femoral artery was interrogated with ultrasound and found to be widely patent. An image was obtained and stored for the medical record. Local anesthesia was attained by infiltration with 1% lidocaine . A small dermatotomy was made. Under real-time sonographic guidance, the vessel was punctured with a 21 gauge micropuncture needle. Using standard technique, the initial micro needle was exchanged over a 0.018 micro wire for a transitional 4 French micro sheath. The micro sheath was then exchanged over a 0.035 wire for a 5 French vascular sheath. Unfortunately, the department is out of RIM catheters. Therefore a Sos Omni selective catheter was advanced over a Bentson wire into the abdominal aorta and formed. The catheter was used to select the origin of the IMA and arteriography was performed. No active bleeding on the initial angiogram. Given the clearly defined location of bleeding in the mid descending artery, a Cook cantata 2.5 French microcatheter was therefore  used to select the left colic artery and arteriography was again performed. Again, no active bleeding. The microcatheter was further advanced into the descending branch of the left colic artery and arteriography was performed. No evidence of active bleeding. The microcatheter was pulled back and advanced into the ascending branch of the left colic artery and arteriography was performed. No evidence of active bleeding. Anatomic leak, the region of colon most corresponding to the recent visualized bleed on CT arteriography is supplied by the descending branch of the left colic artery. Therefore, the microcatheter was again advanced into the descending branch of the left colic artery and advanced distally just proximal to its bifurcation into the many Vasa recta branches. At this time,  heparin  was administered intravenously. TPA was prepared and 4 mg of tPA was administered through the catheter followed by a total of 200 mcg of nitroglycerin . After several minutes additional arteriography was performed. This time, active bleeding is visualized from 1 of the tortuous distal branches. The microcatheter was used to select several of the small branches in till the active bleeding was successfully visualized. At this time, coil embolization was performed using a series of detachable penumbra microcoils. Follow-up arteriography demonstrates no further active bleeding. The microcatheter was removed. Contrast was injected through the base catheter at the origin of the IMA. Unfortunately, this resulted in a small iatrogenic dissection of the proximal IMA. The catheter was moved and repeat injection performed gingerly. The dissection is not flow limiting. This is likely of no consequence and should heal without any further intervention. IMPRESSION: 1. Successful provocative mesenteric angiogram with recreation and visualization of the site of bleeding. 2. Successful coil embolization of distal branches of the descending branch of the left colic artery with cessation of bleeding. 3. 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 This is likely of no consequence and should heal without any further intervention. Electronically Signed   By: Wilkie Lent M.D.   On: 05/17/2024 07:29   IR EMBO ART  VEN HEMORR LYMPH EXTRAV  INC GUIDE ROADMAPPING Result Date: 05/17/2024 INDICATION: 73 year old female with ongoing acute lower GI bleed. CT arteriography was positive late yesterday and patient underwent angiography earlier this morning which was negative for evidence of active bleeding. Patient then did well throughout much of the day before developing recurrent bright red blood per rectum and need for additional transfusion this afternoon. CT arteriography was repeated and confirmed  recurrent bleeding in the mid descending colon. Therefore, patient presents for repeat arteriography and embolization. EXAM: SELECTIVE VISCERAL ARTERIOGRAPHY; ADDITIONAL ARTERIOGRAPHY; IR EMBO ART VEN HEMORR LYMPH EXTRAV INC GUIDE ROADMAPPING; IR ULTRASOUND GUIDANCE VASC ACCESS LEFT MEDICATIONS: 5000 units of heparin  was administered intravenously by the Radiology nurse. 4 mg tPA and 200 mcg nitroglycerin  were administered intra-arterially by me. ANESTHESIA/SEDATION: Moderate (conscious) sedation was employed during this procedure. A total of Versed  2 mg and Fentanyl  200 mcg was administered intravenously by the Radiology nurse. Moderate Sedation Time: 98 minutes. The patient's level of consciousness and vital signs were monitored continuously by radiology nursing throughout the procedure under my direct supervision. CONTRAST:  50mL OMNIPAQUE  IOHEXOL  300 MG/ML SOLN, 40mL OMNIPAQUE  IOHEXOL  300 MG/ML SOLN, 50mL OMNIPAQUE  IOHEXOL  300 MG/ML SOLN FLUOROSCOPY: Radiation Exposure Index (as provided by the fluoroscopic device): 2684 mGy Kerma COMPLICATIONS: SIR Level A - No therapy, no consequence. PROCEDURE: Informed consent was obtained from the patient following explanation of the procedure, risks, benefits and alternatives. The patient understands, agrees and consents for the procedure. All  questions were addressed. A time out was performed prior to the initiation of the procedure. Maximal barrier sterile technique utilized including caps, mask, sterile gowns, sterile gloves, large sterile drape, hand hygiene, and Betadine prep. The left common femoral artery was interrogated with ultrasound and found to be widely patent. An image was obtained and stored for the medical record. Local anesthesia was attained by infiltration with 1% lidocaine . A small dermatotomy was made. Under real-time sonographic guidance, the vessel was punctured with a 21 gauge micropuncture needle. Using standard technique, the initial micro needle  was exchanged over a 0.018 micro wire for a transitional 4 French micro sheath. The micro sheath was then exchanged over a 0.035 wire for a 5 French vascular sheath. Unfortunately, the department is out of RIM catheters. Therefore a Sos Omni selective catheter was advanced over a Bentson wire into the abdominal aorta and formed. The catheter was used to select the origin of the IMA and arteriography was performed. No active bleeding on the initial angiogram. Given the clearly defined location of bleeding in the mid descending artery, a Cook cantata 2.5 French microcatheter was therefore used to select the left colic artery and arteriography was again performed. Again, no active bleeding. The microcatheter was further advanced into the descending branch of the left colic artery and arteriography was performed. No evidence of active bleeding. The microcatheter was pulled back and advanced into the ascending branch of the left colic artery and arteriography was performed. No evidence of active bleeding. Anatomic leak, the region of colon most corresponding to the recent visualized bleed on CT arteriography is supplied by the descending branch of the left colic artery. Therefore, the microcatheter was again advanced into the descending branch of the left colic artery and advanced distally just proximal to its bifurcation into the many Vasa recta branches. At this time, heparin  was administered intravenously. TPA was prepared and 4 mg of tPA was administered through the catheter followed by a total of 200 mcg of nitroglycerin . After several minutes additional arteriography was performed. This time, active bleeding is visualized from 1 of the tortuous distal branches. The microcatheter was used to select several of the small branches in till the active bleeding was successfully visualized. At this time, coil embolization was performed using a series of detachable penumbra microcoils. Follow-up arteriography demonstrates  no further active bleeding. The microcatheter was removed. Contrast was injected through the base catheter at the origin of the IMA. Unfortunately, this resulted in a small iatrogenic dissection of the proximal IMA. The catheter was moved and repeat injection performed gingerly. The dissection is not flow limiting. This is likely of no consequence and should heal without any further intervention. IMPRESSION: 1. Successful provocative mesenteric angiogram with recreation and visualization of the site of bleeding. 2. Successful coil embolization of distal branches of the descending branch of the left colic artery with cessation of bleeding. 3. 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 This is likely of no consequence and should heal without any further intervention. Electronically Signed   By: Wilkie Lent M.D.   On: 05/17/2024 07:29   IR Angiogram Selective Each Additional Vessel Result Date: 05/17/2024 INDICATION: 73 year old female with ongoing acute lower GI bleed. CT arteriography was positive late yesterday and patient underwent angiography earlier this morning which was negative for evidence of active bleeding. Patient then did well throughout much of the day before developing recurrent bright red blood per rectum and need for additional transfusion this afternoon. CT  arteriography was repeated and confirmed recurrent bleeding in the mid descending colon. Therefore, patient presents for repeat arteriography and embolization. EXAM: SELECTIVE VISCERAL ARTERIOGRAPHY; ADDITIONAL ARTERIOGRAPHY; IR EMBO ART VEN HEMORR LYMPH EXTRAV INC GUIDE ROADMAPPING; IR ULTRASOUND GUIDANCE VASC ACCESS LEFT MEDICATIONS: 5000 units of heparin  was administered intravenously by the Radiology nurse. 4 mg tPA and 200 mcg nitroglycerin  were administered intra-arterially by me. ANESTHESIA/SEDATION: Moderate (conscious) sedation was employed during this procedure. A total of Versed  2 mg and Fentanyl  200  mcg was administered intravenously by the Radiology nurse. Moderate Sedation Time: 98 minutes. The patient's level of consciousness and vital signs were monitored continuously by radiology nursing throughout the procedure under my direct supervision. CONTRAST:  50mL OMNIPAQUE  IOHEXOL  300 MG/ML SOLN, 40mL OMNIPAQUE  IOHEXOL  300 MG/ML SOLN, 50mL OMNIPAQUE  IOHEXOL  300 MG/ML SOLN FLUOROSCOPY: Radiation Exposure Index (as provided by the fluoroscopic device): 2684 mGy Kerma COMPLICATIONS: SIR Level A - No therapy, no consequence. PROCEDURE: Informed consent was obtained from the patient following explanation of the procedure, risks, benefits and alternatives. The patient understands, agrees and consents for the procedure. All questions were addressed. A time out was performed prior to the initiation of the procedure. Maximal barrier sterile technique utilized including caps, mask, sterile gowns, sterile gloves, large sterile drape, hand hygiene, and Betadine prep. The left common femoral artery was interrogated with ultrasound and found to be widely patent. An image was obtained and stored for the medical record. Local anesthesia was attained by infiltration with 1% lidocaine . A small dermatotomy was made. Under real-time sonographic guidance, the vessel was punctured with a 21 gauge micropuncture needle. Using standard technique, the initial micro needle was exchanged over a 0.018 micro wire for a transitional 4 French micro sheath. The micro sheath was then exchanged over a 0.035 wire for a 5 French vascular sheath. Unfortunately, the department is out of RIM catheters. Therefore a Sos Omni selective catheter was advanced over a Bentson wire into the abdominal aorta and formed. The catheter was used to select the origin of the IMA and arteriography was performed. No active bleeding on the initial angiogram. Given the clearly defined location of bleeding in the mid descending artery, a Cook cantata 2.5 French  microcatheter was therefore used to select the left colic artery and arteriography was again performed. Again, no active bleeding. The microcatheter was further advanced into the descending branch of the left colic artery and arteriography was performed. No evidence of active bleeding. The microcatheter was pulled back and advanced into the ascending branch of the left colic artery and arteriography was performed. No evidence of active bleeding. Anatomic leak, the region of colon most corresponding to the recent visualized bleed on CT arteriography is supplied by the descending branch of the left colic artery. Therefore, the microcatheter was again advanced into the descending branch of the left colic artery and advanced distally just proximal to its bifurcation into the many Vasa recta branches. At this time, heparin  was administered intravenously. TPA was prepared and 4 mg of tPA was administered through the catheter followed by a total of 200 mcg of nitroglycerin . After several minutes additional arteriography was performed. This time, active bleeding is visualized from 1 of the tortuous distal branches. The microcatheter was used to select several of the small branches in till the active bleeding was successfully visualized. At this time, coil embolization was performed using a series of detachable penumbra microcoils. Follow-up arteriography demonstrates no further active bleeding. The microcatheter was removed. Contrast was injected through the  base catheter at the origin of the IMA. Unfortunately, this resulted in a small iatrogenic dissection of the proximal IMA. The catheter was moved and repeat injection performed gingerly. The dissection is not flow limiting. This is likely of no consequence and should heal without any further intervention. IMPRESSION: 1. Successful provocative mesenteric angiogram with recreation and visualization of the site of bleeding. 2. Successful coil embolization of distal  branches of the descending branch of the left colic artery with cessation of bleeding. 3. 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 This is likely of no consequence and should heal without any further intervention. Electronically Signed   By: Wilkie Lent M.D.   On: 05/17/2024 07:29   IR Angiogram Selective Each Additional Vessel Result Date: 05/17/2024 INDICATION: 73 year old female with ongoing acute lower GI bleed. CT arteriography was positive late yesterday and patient underwent angiography earlier this morning which was negative for evidence of active bleeding. Patient then did well throughout much of the day before developing recurrent bright red blood per rectum and need for additional transfusion this afternoon. CT arteriography was repeated and confirmed recurrent bleeding in the mid descending colon. Therefore, patient presents for repeat arteriography and embolization. EXAM: SELECTIVE VISCERAL ARTERIOGRAPHY; ADDITIONAL ARTERIOGRAPHY; IR EMBO ART VEN HEMORR LYMPH EXTRAV INC GUIDE ROADMAPPING; IR ULTRASOUND GUIDANCE VASC ACCESS LEFT MEDICATIONS: 5000 units of heparin  was administered intravenously by the Radiology nurse. 4 mg tPA and 200 mcg nitroglycerin  were administered intra-arterially by me. ANESTHESIA/SEDATION: Moderate (conscious) sedation was employed during this procedure. A total of Versed  2 mg and Fentanyl  200 mcg was administered intravenously by the Radiology nurse. Moderate Sedation Time: 98 minutes. The patient's level of consciousness and vital signs were monitored continuously by radiology nursing throughout the procedure under my direct supervision. CONTRAST:  50mL OMNIPAQUE  IOHEXOL  300 MG/ML SOLN, 40mL OMNIPAQUE  IOHEXOL  300 MG/ML SOLN, 50mL OMNIPAQUE  IOHEXOL  300 MG/ML SOLN FLUOROSCOPY: Radiation Exposure Index (as provided by the fluoroscopic device): 2684 mGy Kerma COMPLICATIONS: SIR Level A - No therapy, no consequence. PROCEDURE: Informed consent was  obtained from the patient following explanation of the procedure, risks, benefits and alternatives. The patient understands, agrees and consents for the procedure. All questions were addressed. A time out was performed prior to the initiation of the procedure. Maximal barrier sterile technique utilized including caps, mask, sterile gowns, sterile gloves, large sterile drape, hand hygiene, and Betadine prep. The left common femoral artery was interrogated with ultrasound and found to be widely patent. An image was obtained and stored for the medical record. Local anesthesia was attained by infiltration with 1% lidocaine . A small dermatotomy was made. Under real-time sonographic guidance, the vessel was punctured with a 21 gauge micropuncture needle. Using standard technique, the initial micro needle was exchanged over a 0.018 micro wire for a transitional 4 French micro sheath. The micro sheath was then exchanged over a 0.035 wire for a 5 French vascular sheath. Unfortunately, the department is out of RIM catheters. Therefore a Sos Omni selective catheter was advanced over a Bentson wire into the abdominal aorta and formed. The catheter was used to select the origin of the IMA and arteriography was performed. No active bleeding on the initial angiogram. Given the clearly defined location of bleeding in the mid descending artery, a Cook cantata 2.5 French microcatheter was therefore used to select the left colic artery and arteriography was again performed. Again, no active bleeding. The microcatheter was further advanced into the descending branch of the left colic artery and  arteriography was performed. No evidence of active bleeding. The microcatheter was pulled back and advanced into the ascending branch of the left colic artery and arteriography was performed. No evidence of active bleeding. Anatomic leak, the region of colon most corresponding to the recent visualized bleed on CT arteriography is supplied by the  descending branch of the left colic artery. Therefore, the microcatheter was again advanced into the descending branch of the left colic artery and advanced distally just proximal to its bifurcation into the many Vasa recta branches. At this time, heparin  was administered intravenously. TPA was prepared and 4 mg of tPA was administered through the catheter followed by a total of 200 mcg of nitroglycerin . After several minutes additional arteriography was performed. This time, active bleeding is visualized from 1 of the tortuous distal branches. The microcatheter was used to select several of the small branches in till the active bleeding was successfully visualized. At this time, coil embolization was performed using a series of detachable penumbra microcoils. Follow-up arteriography demonstrates no further active bleeding. The microcatheter was removed. Contrast was injected through the base catheter at the origin of the IMA. Unfortunately, this resulted in a small iatrogenic dissection of the proximal IMA. The catheter was moved and repeat injection performed gingerly. The dissection is not flow limiting. This is likely of no consequence and should heal without any further intervention. IMPRESSION: 1. Successful provocative mesenteric angiogram with recreation and visualization of the site of bleeding. 2. Successful coil embolization of distal branches of the descending branch of the left colic artery with cessation of bleeding. 3. 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 This is likely of no consequence and should heal without any further intervention. Electronically Signed   By: Wilkie Lent M.D.   On: 05/17/2024 07:29   CT ANGIO GI BLEED Result Date: 05/16/2024 CLINICAL DATA:  Active rectal bleeding EXAM: CTA ABDOMEN AND PELVIS WITHOUT AND WITH CONTRAST TECHNIQUE: Multidetector CT imaging of the abdomen and pelvis was performed using the standard protocol during bolus  administration of intravenous contrast. Multiplanar reconstructed images and MIPs were obtained and reviewed to evaluate the vascular anatomy. RADIATION DOSE REDUCTION: This exam was performed according to the departmental dose-optimization program which includes automated exposure control, adjustment of the mA and/or kV according to patient size and/or use of iterative reconstruction technique. CONTRAST:  OMNIPAQUE  IOHEXOL  350 MG/ML SOLN COMPARISON:  05/16/2024, 05/15/2024 FINDINGS: VASCULAR Aorta: Normal caliber aorta without aneurysm, dissection, vasculitis or significant stenosis. Minimal atherosclerosis. Celiac: Patent without evidence of aneurysm, dissection, vasculitis or significant stenosis. SMA: Patent without evidence of aneurysm, dissection, vasculitis or significant stenosis. Renals: Both renal arteries are patent without evidence of aneurysm, dissection, vasculitis, fibromuscular dysplasia or significant stenosis. Mild atherosclerosis of the left renal artery. IMA: Patent without evidence of aneurysm, dissection, vasculitis or significant stenosis. Inflow: Patent without evidence of aneurysm, dissection, vasculitis or significant stenosis. Mild atherosclerosis. Proximal Outflow: Bilateral common femoral and visualized portions of the superficial and profunda femoral arteries are patent without evidence of aneurysm, dissection, vasculitis or significant stenosis. Postprocedural changes are seen within the right common femoral artery related to recent visceral arteriogram. There is a 0.8 x 0.7 x 1.1 cm pseudoaneurysm off the ventral margin of the common femoral artery, with a patent narrow neck measuring 0.3 cm supplying the pseudoaneurysm. Mild surrounding subcutaneous fat stranding. Veins: No obvious venous abnormality within the limitations of this arterial phase study. Review of the MIP images confirms the above findings. NON-VASCULAR Lower  chest: No acute pleural or parenchymal lung disease.  Hepatobiliary: Gallbladder sludge versus vicarious excretion of previously administered contrast. No cholecystitis. The liver is unremarkable. Pancreas: Unremarkable. No pancreatic ductal dilatation or surrounding inflammatory changes. Spleen: Normal in size without focal abnormality. Adrenals/Urinary Tract: Adrenal glands are unremarkable. Kidneys are normal, without renal calculi, focal lesion, or hydronephrosis. Nonspecific gas in the bladder lumen, please correlate with any recent instrumentation or catheterization. Stomach/Bowel: There is intraluminal accumulation of contrast along the mesenteric wall of the mid descending colon, reference axial images 95 through 108 of series 6. This is in a similar distribution to where the active hemorrhage was reported on prior CTA, and is more subtle on this exam than prior. No other areas of active gastrointestinal hemorrhage are identified. No bowel obstruction or ileus. Scattered gas fluid levels throughout the colon may reflect diarrhea. Small hiatal hernia. Lymphatic: No pathologic adenopathy. Reproductive: Status post hysterectomy. No adnexal masses. Other: No free fluid or free intraperitoneal gas. 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 No incarceration or obstruction. Musculoskeletal: No acute or destructive bony abnormalities. Reconstructed images demonstrate no additional findings. IMPRESSION: VASCULAR 1. Active gastrointestinal hemorrhage along the mesenteric wall of the mid descending colon, in a similar location where the active hemorrhage with seen on previous CT angiography procedure. Visceral angiogram performed earlier today did not demonstrate active hemorrhage in this region however. 2. 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 3.  Aortic Atherosclerosis (ICD10-I70.0). NON-VASCULAR 1. Colonic gas fluid levels  consistent with diarrhea. 2. Stable midline ventral hernia. 3. Gallbladder sludge versus vicarious excretion of contrast. No evidence of acute cholecystitis. Critical Value/emergent results were called by telephone at the time of interpretation on 05/16/2024 at 6:52 pm to provider [facility] , who verbally acknowledged these results. Electronically Signed   By: Ozell Daring M.D.   On: 05/16/2024 18:59   IR Angiogram Visceral Selective Result Date: 05/16/2024 INDICATION: 73 year old female with history of acute lower gastrointestinal hemorrhage likely secondary to diverticulosis with associated anemia. EXAM: IR ULTRASOUND GUIDANCE VASC ACCESS RIGHT; ADDITIONAL ARTERIOGRAPHY; SELECTIVE VISCERAL ARTERIOGRAPHY MEDICATIONS: None. ANESTHESIA/SEDATION: Moderate (conscious) sedation was employed during this procedure. A total of Versed  2 mg and Fentanyl  100 mcg was administered intravenously. Moderate Sedation Time: 20 minutes. The patient's level of consciousness and vital signs were monitored continuously by radiology nursing throughout the procedure under my direct supervision. CONTRAST:  OMNIPAQUE  IOHEXOL  350 MG/ML SOLN, 25mL OMNIPAQUE  IOHEXOL  300 MG/ML SOLN, 25mL OMNIPAQUE  IOHEXOL  300 MG/ML SOLN FLUOROSCOPY: Radiation Exposure Index (as provided by the fluoroscopic device): 613 mGy reference air Kerma COMPLICATIONS: None immediate. PROCEDURE: Informed consent was obtained from the patient following explanation of the procedure, risks, benefits and alternatives. The patient understands, agrees and consents for the procedure. All questions were addressed. A time out was performed prior to the initiation of the procedure. Maximal barrier sterile technique utilized including caps, mask, sterile gowns, sterile gloves, large sterile drape, hand hygiene, and Betadine prep. Preprocedure ultrasound evaluation of the right common femoral artery demonstrated patency. The procedure was planned. Subdermal Local  anesthesia was administered at the planned needle entry site with 1% lidocaine . A small skin nick was made. Under direct ultrasound visualization, a 21 gauge micropuncture needle was directed into the right common femoral artery. A permanent cm captured and stored in the record. A micropuncture sheath was then introduced through which a limited right lower extremity  angiogram was performed which demonstrated adequate puncture site for closure device use. A Bentson wire was directed to the abdominal aorta over which the micropuncture sheath was exchanged for a 5 French vascular sheath. A 5 French rim catheter was introduced over the wire and used to select the inferior mesenteric artery. Inferior mesenteric angiogram was performed which demonstrated patency throughout with arterial supply to the descending and sigmoid colon. There was no obvious extravasation. A 2.4 French Progreat microcatheter and fathom 16 microwire were then used to select the left colic artery middle branch. Dedicated angiogram was performed at this location which demonstrated no evidence of active extravasation in the descending colon. A more distal, descending branch of the left colic artery was selected and repeat angiogram was performed. Again, no evidence of active extravasation was seen at this site. The catheter was redirected to a superior left colic branch and repeat angiogram was performed at this location. Again, no evidence of active extravasation was visualized. Microcatheter was then retracted to the proximal inferior mesenteric artery and repeat angiogram was performed which demonstrated no evidence of active extravasation. The catheters were removed. The 5 French sheath was exchanged for 6 French Angio-Seal device which was deployed successfully without complication. Peripheral pulses were unchanged. A sterile bandage was applied. The patient tolerated the procedure well was transferred back to the ED in good condition.  IMPRESSION: Technically successful inferior mesenteric angiogram with selective angiography of the left colic artery branches without evidence of active extravasation. No embolization was performed. Ester Sides, MD [facility] Rad[facility] Electronically Signed   By: Ester Sides M.D.   On: 05/16/2024 11:17   IR US  Guide Vasc Access Right Result Date: 05/16/2024 INDICATION: 73 year old female with history of acute lower gastrointestinal hemorrhage likely secondary to diverticulosis with associated anemia. EXAM: IR ULTRASOUND GUIDANCE VASC ACCESS RIGHT; ADDITIONAL ARTERIOGRAPHY; SELECTIVE VISCERAL ARTERIOGRAPHY MEDICATIONS: None. ANESTHESIA/SEDATION: Moderate (conscious) sedation was employed during this procedure. A total of Versed  2 mg and Fentanyl  100 mcg was administered intravenously. Moderate Sedation Time: 20 minutes. The patient's level of consciousness and vital signs were monitored continuously by radiology nursing throughout the procedure under my direct supervision. CONTRAST:  OMNIPAQUE  IOHEXOL  350 MG/ML SOLN, 25mL OMNIPAQUE  IOHEXOL  300 MG/ML SOLN, 25mL OMNIPAQUE  IOHEXOL  300 MG/ML SOLN FLUOROSCOPY: Radiation Exposure Index (as provided by the fluoroscopic device): 613 mGy reference air Kerma COMPLICATIONS: None immediate. PROCEDURE: Informed consent was obtained from the patient following explanation of the procedure, risks, benefits and alternatives. The patient understands, agrees and consents for the procedure. All questions were addressed. A time out was performed prior to the initiation of the procedure. Maximal barrier sterile technique utilized including caps, mask, sterile gowns, sterile gloves, large sterile drape, hand hygiene, and Betadine prep. Preprocedure ultrasound evaluation of the right common femoral artery demonstrated patency. The procedure was planned. Subdermal Local anesthesia was administered at the planned needle entry site  with 1% lidocaine . A small skin nick was made. Under direct ultrasound visualization, a 21 gauge micropuncture needle was directed into the right common femoral artery. A permanent cm captured and stored in the record. A micropuncture sheath was then introduced through which a limited right lower extremity angiogram was performed which demonstrated adequate puncture site for closure device use. A Bentson wire was directed to the abdominal aorta over which the micropuncture sheath was exchanged for a 5 French vascular sheath. A 5 French rim catheter was introduced over the wire and used to select the inferior mesenteric artery.  Inferior mesenteric angiogram was performed which demonstrated patency throughout with arterial supply to the descending and sigmoid colon. There was no obvious extravasation. A 2.4 French Progreat microcatheter and fathom 16 microwire were then used to select the left colic artery middle branch. Dedicated angiogram was performed at this location which demonstrated no evidence of active extravasation in the descending colon. A more distal, descending branch of the left colic artery was selected and repeat angiogram was performed. Again, no evidence of active extravasation was seen at this site. The catheter was redirected to a superior left colic branch and repeat angiogram was performed at this location. Again, no evidence of active extravasation was visualized. Microcatheter was then retracted to the proximal inferior mesenteric artery and repeat angiogram was performed which demonstrated no evidence of active extravasation. The catheters were removed. The 5 French sheath was exchanged for 6 French Angio-Seal device which was deployed successfully without complication. Peripheral pulses were unchanged. A sterile bandage was applied. The patient tolerated the procedure well was transferred back to the ED in good condition. IMPRESSION: Technically successful inferior mesenteric angiogram with  selective angiography of the left colic artery branches without evidence of active extravasation. No embolization was performed. Ester Sides, MD [facility] Radiology Electronically Signed   By: Ester Sides M.D.   On: 05/16/2024 11:17   IR Angiogram Selective Each Additional Vessel Result Date: 05/16/2024 INDICATION: 74 year old female with history of acute lower gastrointestinal hemorrhage likely secondary to diverticulosis with associated anemia. EXAM: IR ULTRASOUND GUIDANCE VASC ACCESS RIGHT; ADDITIONAL ARTERIOGRAPHY; SELECTIVE VISCERAL ARTERIOGRAPHY MEDICATIONS: None. ANESTHESIA/SEDATION: Moderate (conscious) sedation was employed during this procedure. A total of Versed  2 mg and Fentanyl  100 mcg was administered intravenously. Moderate Sedation Time: 20 minutes. The patient's level of consciousness and vital signs were monitored continuously by radiology nursing throughout the procedure under my direct supervision. CONTRAST:  OMNIPAQUE  IOHEXOL  350 MG/ML SOLN, 25mL OMNIPAQUE  IOHEXOL  300 MG/ML SOLN, 25mL OMNIPAQUE  IOHEXOL  300 MG/ML SOLN FLUOROSCOPY: Radiation Exposure Index (as provided by the fluoroscopic device): 613 mGy reference air Kerma COMPLICATIONS: None immediate. PROCEDURE: Informed consent was obtained from the patient following explanation of the procedure, risks, benefits and alternatives. The patient understands, agrees and consents for the procedure. All questions were addressed. A time out was performed prior to the initiation of the procedure. Maximal barrier sterile technique utilized including caps, mask, sterile gowns, sterile gloves, large sterile drape, hand hygiene, and Betadine prep. Preprocedure ultrasound evaluation of the right common femoral artery demonstrated patency. The procedure was planned. Subdermal Local anesthesia was administered at the planned needle entry site with 1% lidocaine . A small skin nick was made. Under  direct ultrasound visualization, a 21 gauge micropuncture needle was directed into the right common femoral artery. A permanent cm captured and stored in the record. A micropuncture sheath was then introduced through which a limited right lower extremity angiogram was performed which demonstrated adequate puncture site for closure device use. A Bentson wire was directed to the abdominal aorta over which the micropuncture sheath was exchanged for a 5 French vascular sheath. A 5 French rim catheter was introduced over the wire and used to select the inferior mesenteric artery. Inferior mesenteric angiogram was performed which demonstrated patency throughout with arterial supply to the descending and sigmoid colon. There was no obvious extravasation. A 2.4 French Progreat microcatheter and fathom 16 microwire were then used to select the left colic artery middle branch. Dedicated angiogram was performed at this location which demonstrated  no evidence of active extravasation in the descending colon. A more distal, descending branch of the left colic artery was selected and repeat angiogram was performed. Again, no evidence of active extravasation was seen at this site. The catheter was redirected to a superior left colic branch and repeat angiogram was performed at this location. Again, no evidence of active extravasation was visualized. Microcatheter was then retracted to the proximal inferior mesenteric artery and repeat angiogram was performed which demonstrated no evidence of active extravasation. The catheters were removed. The 5 French sheath was exchanged for 6 French Angio-Seal device which was deployed successfully without complication. Peripheral pulses were unchanged. A sterile bandage was applied. The patient tolerated the procedure well was transferred back to the ED in good condition. IMPRESSION: Technically successful inferior mesenteric angiogram with selective angiography of the left colic artery  branches without evidence of active extravasation. No embolization was performed. Ester Sides, MD [facility] Electronically Signed   By: Ester Sides M.D.   On: 05/16/2024 11:17   IR Angiogram Selective Each Additional Vessel Result Date: 05/16/2024 INDICATION: 73 year old female with history of acute lower gastrointestinal hemorrhage likely secondary to diverticulosis with associated anemia. EXAM: IR ULTRASOUND GUIDANCE VASC ACCESS RIGHT; ADDITIONAL ARTERIOGRAPHY; SELECTIVE VISCERAL ARTERIOGRAPHY MEDICATIONS: None. ANESTHESIA/SEDATION: Moderate (conscious) sedation was employed during this procedure. A total of Versed  2 mg and Fentanyl  100 mcg was administered intravenously. Moderate Sedation Time: 20 minutes. The patient's level of consciousness and vital signs were monitored continuously by radiology nursing throughout the procedure under my direct supervision. CONTRAST:  OMNIPAQUE  IOHEXOL  350 MG/ML SOLN, 25mL OMNIPAQUE  IOHEXOL  300 MG/ML SOLN, 25mL OMNIPAQUE  IOHEXOL  300 MG/ML SOLN FLUOROSCOPY: Radiation Exposure Index (as provided by the fluoroscopic device): 613 mGy reference air Kerma COMPLICATIONS: None immediate. PROCEDURE: Informed consent was obtained from the patient following explanation of the procedure, risks, benefits and alternatives. The patient understands, agrees and consents for the procedure. All questions were addressed. A time out was performed prior to the initiation of the procedure. Maximal barrier sterile technique utilized including caps, mask, sterile gowns, sterile gloves, large sterile drape, hand hygiene, and Betadine prep. Preprocedure ultrasound evaluation of the right common femoral artery demonstrated patency. The procedure was planned. Subdermal Local anesthesia was administered at the planned needle entry site with 1% lidocaine . A small skin nick was made. Under direct ultrasound visualization, a 21 gauge  micropuncture needle was directed into the right common femoral artery. A permanent cm captured and stored in the record. A micropuncture sheath was then introduced through which a limited right lower extremity angiogram was performed which demonstrated adequate puncture site for closure device use. A Bentson wire was directed to the abdominal aorta over which the micropuncture sheath was exchanged for a 5 French vascular sheath. A 5 French rim catheter was introduced over the wire and used to select the inferior mesenteric artery. Inferior mesenteric angiogram was performed which demonstrated patency throughout with arterial supply to the descending and sigmoid colon. There was no obvious extravasation. A 2.4 French Progreat microcatheter and fathom 16 microwire were then used to select the left colic artery middle branch. Dedicated angiogram was performed at this location which demonstrated no evidence of active extravasation in the descending colon. A more distal, descending branch of the left colic artery was selected and repeat angiogram was performed. Again, no evidence of active extravasation was seen at this site. The catheter was redirected to a superior left colic branch and repeat angiogram was performed  at this location. Again, no evidence of active extravasation was visualized. Microcatheter was then retracted to the proximal inferior mesenteric artery and repeat angiogram was performed which demonstrated no evidence of active extravasation. The catheters were removed. The 5 French sheath was exchanged for 6 French Angio-Seal device which was deployed successfully without complication. Peripheral pulses were unchanged. A sterile bandage was applied. The patient tolerated the procedure well was transferred back to the ED in good condition. IMPRESSION: Technically successful inferior mesenteric angiogram with selective angiography of the left colic artery branches without evidence of active extravasation.  No embolization was performed. Ester Sides, MD [facility] Electronically Signed   By: Ester Sides M.D.   On: 05/16/2024 11:17   CT Angio Abd/Pel W and/or Wo Contrast Result Date: 05/15/2024 EXAM: CTA ABDOMEN AND PELVIS WITH AND WITHOUT CONTRAST 05/15/2024 10:47:14 PM TECHNIQUE: CTA images of the abdomen and pelvis without and with intravenous contrast. Three-dimensional MIP/volume rendered formations were performed. Automated exposure control, iterative reconstruction, and/or weight based adjustment of the mA/kV was utilized to reduce the radiation dose to as low as reasonably achievable. COMPARISON: Comparison with abdominal radiograph 05/14/2024 and CT abdomen and pelvis 04/01/2024. CLINICAL HISTORY: Lower GI bleed. FINDINGS: VASCULATURE: AORTA: Minimal scattered aortic calcification. No acute finding. No abdominal aortic aneurysm. No dissection. CELIAC TRUNK: No acute finding. No occlusion or significant stenosis. SUPERIOR MESENTERIC ARTERY: No acute finding. No occlusion or significant stenosis. RENAL ARTERIES: No acute finding. No occlusion or significant stenosis. ILIAC ARTERIES: No acute finding. No occlusion or significant stenosis. LIVER: The liver is unremarkable. GALLBLADDER AND BILE DUCTS: Gallbladder is unremarkable. No biliary ductal dilatation. SPLEEN: The spleen is unremarkable. PANCREAS: The pancreas is unremarkable. ADRENAL GLANDS: Bilateral adrenal glands demonstrate no acute abnormality. KIDNEYS, URETERS AND BLADDER: No stones in the kidneys or ureters. No hydronephrosis. No perinephric or periureteral stranding. Urinary bladder is unremarkable. GI AND BOWEL: Small periumbilical hernia with broad base containing small bowel and colon but without proximal obstruction. Diffusely stool-filled colon. No small or large bowel distention. Colonic diverticula without evidence of acute diverticulitis. In the mid descending colon, there is  a focal area of intraluminal contrast extravasation on the arterial phase with progressive pooling on the delayed phase. No corresponding density is demonstrated on the unenhanced images. This is consistent with a focal site of acute gastrointestinal hemorrhage. There is a diverticulum adjacent to the area of hemorrhage, suggesting that this is most likely to represent diverticular bleed , less likely due to an occult mass. REPRODUCTIVE: The uterus is surgically absent. PERITONEUM AND RETRPERITONEUM: No ascites or free air. LUNG BASE: Lung bases are clear. LYMPH NODES: No lymphadenopathy. BONES AND SOFT TISSUES: Degenerative changes in the spine. Scarring along the midline consistent with postoperative change. No acute abnormality of the bones. No acute soft tissue abnormality. Critical results/urgent findings were called at 10:56 pm on 05/15/2024 by radiologist W. Okey Gravely, MD to Dr. Jerilynn Later. IMPRESSION: 1. Focal site of acute gastrointestinal hemorrhage in the mid descending colon, with intraluminal contrast extravasation on arterial phase and progressive pooling on delayed phase. Etiology is indeterminate but most likely to represent diverticular bleed. 2. Small periumbilical hernia containing small bowel and colon without proximal obstruction. Electronically signed by: Elsie Gravely MD 05/15/2024 11:02 PM EST RP Workstation: [id number]      Scheduled Meds:  atorvastatin   40 mg Oral Daily   Chlorhexidine  Gluconate Cloth  6 each Topical Daily   ezetimibe   10 mg Oral Daily  losartan   50 mg Oral Daily   pantoprazole  (PROTONIX ) IV  40 mg Intravenous Q12H   Continuous Infusions:   LOS: 1 day   Time spent: 35 minutes   Delon Hoe, DO [facility] 05/17/2024, 12:32 PM   Available via Epic secure chat 7am-7pm After these hours, please refer to coverage provider listed on [email or web address]

## 2024-05-17 NOTE — Progress Notes (Signed)
   05/17/24 2028  BiPAP/CPAP/SIPAP  BiPAP/CPAP/SIPAP Pt Type Adult  Mask Type Nasal pillows  Dentures removed? Not applicable  FiO2 (%) 21 %  Heater Temperature  (patient declines sterile water)  Patient Home Machine Yes  Safety Check Completed by RT for Home Unit Yes, no issues noted  Patient Home Mask Yes  Patient Home Tubing Yes  Auto Titrate Yes  Minimum cmH2O 5 cmH2O  Maximum cmH2O 10 cmH2O  Device Plugged into RED Power Outlet Yes  BiPAP/CPAP /SiPAP Vitals  Pulse Rate 73  Resp 17  SpO2 98 %  Bilateral Breath Sounds Clear  MEWS Score/Color  MEWS Score 0  MEWS Score Color Landy

## 2024-05-17 NOTE — Progress Notes (Signed)
 [facility] Gastroenterology Progress Note  Brittany Middleton 73 y.o. 27-Mar-1951   Subjective: Denies abdominal pain.  No rectal bleeding overnight.  Son at bedside. Objective: Vital signs: Vitals:   05/17/24 1100 05/17/24 1158  BP: (!) 175/55 (!) 162/70  Pulse: 99 89  Resp: 19 16  Temp:  98.2 F (36.8 C)  SpO2: 94% 96%    Physical Exam: Gen: alert, no acute distress, obese, pleasant HEENT: anicteric sclera CV: RRR Chest: CTA B Abd: Soft nontender nondistended positive bowel sounds Ext: no edema  Lab Results: Recent Labs    05/15/24 2105 05/16/24 0519  NA 140 140  K 4.4 4.0  CL 103 106  CO2 26 27  GLUCOSE 119* 111*  BUN 16 16  CREATININE 0.80 0.59  CALCIUM  9.1 8.2*   Recent Labs    05/15/24 0927 05/15/24 2105  AST 23 24  ALT 15 14  ALKPHOS 73 81  BILITOT 0.5 0.4  PROT 7.1 6.7  ALBUMIN 4.4 4.1   Recent Labs    05/15/24 0927 05/15/24 2105 05/16/24 0519 05/16/24 1309 05/16/24 1830 05/17/24 0351 05/17/24 0656  WBC 3.5*   < > 5.0  --  8.6  --   --   NEUTROABS 2.4  --   --   --   --   --   --   HGB 9.6*   < > 8.0*   < > 7.9* 9.8* 9.8*  HCT 32.2*   < > 26.5*   < > 25.1* 30.3* 30.1*  MCV 85.2   < > 87.2  --  85.7  --   --   PLT 297   < > 231  --  202  --   --    < > = values in this interval not displayed.      Assessment/Plan: Colonic bleed status post embolization of distal branches of the left colic artery.  No bleeding since procedure.  Hemoglobin 9.8.  Clear liquid diet only today and if stable slowly advance tomorrow.  Supportive care.  Will follow.   Jerrell JAYSON Sol 05/17/2024, 12:07 PM  Questions please call [phone] ID: Brittany Middleton, female   DOB: 11/22/1950, 73 y.o.   MRN: [id number]

## 2024-05-18 ENCOUNTER — Inpatient Hospital Stay (HOSPITAL_COMMUNITY)

## 2024-05-18 ENCOUNTER — Inpatient Hospital Stay

## 2024-05-18 DIAGNOSIS — K922 Gastrointestinal hemorrhage, unspecified: Secondary | ICD-10-CM | POA: Diagnosis not present

## 2024-05-18 LAB — CBC
HCT: 23.7 % — ABNORMAL LOW (ref 36.0–46.0)
Hemoglobin: 7.4 g/dL — ABNORMAL LOW (ref 12.0–15.0)
MCH: 26.8 pg (ref 26.0–34.0)
MCHC: 31.2 g/dL (ref 30.0–36.0)
MCV: 85.9 fL (ref 80.0–100.0)
Platelets: 171 K/uL (ref 150–400)
RBC: 2.76 MIL/uL — ABNORMAL LOW (ref 3.87–5.11)
RDW: 20.2 % — ABNORMAL HIGH (ref 11.5–15.5)
WBC: 6.8 K/uL (ref 4.0–10.5)
nRBC: 0 % (ref 0.0–0.2)

## 2024-05-18 LAB — BASIC METABOLIC PANEL WITH GFR
Anion gap: 6 (ref 5–15)
BUN: 7 mg/dL — ABNORMAL LOW (ref 8–23)
CO2: 27 mmol/L (ref 22–32)
Calcium: 8 mg/dL — ABNORMAL LOW (ref 8.9–10.3)
Chloride: 107 mmol/L (ref 98–111)
Creatinine, Ser: 0.57 mg/dL (ref 0.44–1.00)
GFR, Estimated: >60 mL/min (ref >60)
Glucose, Bld: 101 mg/dL — ABNORMAL HIGH (ref 70–99)
Potassium: 3.6 mmol/L (ref 3.5–5.1)
Sodium: 140 mmol/L (ref 135–145)

## 2024-05-18 LAB — HEMOGLOBIN AND HEMATOCRIT, BLOOD
HCT: 25.6 % — ABNORMAL LOW (ref 36.0–46.0)
Hemoglobin: 8.2 g/dL — ABNORMAL LOW (ref 12.0–15.0)

## 2024-05-18 MED ORDER — POLYETHYLENE GLYCOL 3350 17 G PO PACK
17.0000 g | PACK | Freq: Every day | ORAL | Status: DC
  Administered 2024-05-18: 17 g via ORAL
  Filled 2024-05-18: qty 1

## 2024-05-18 MED ORDER — POLYETHYLENE GLYCOL 3350 17 G PO PACK
17.0000 g | PACK | Freq: Two times a day (BID) | ORAL | Status: DC
  Administered 2024-05-18 – 2024-05-20 (×4): 17 g via ORAL
  Filled 2024-05-18 (×4): qty 1

## 2024-05-18 NOTE — Evaluation (Signed)
 Physical Therapy Evaluation Patient Details Name: Brittany Middleton MRN: [id number] DOB: 10/08/1950 Today's Date: 05/18/2024  History of Present Illness  73 yo female presents to therapy following hospital admission on 05/15/2024 due to rectal bleeding. Pt was recently hospitalized for lower GI bleed. Pt found to have HgB of 7, positive FOBT and underwent IR evaluation for embolization on 11/26. Pt PMH includes but is not limited to: abdominal carcinomatosis, anemia, arthritis, aneurysms, asthma, angina, HTN, GERD, GI bleed, adenocarcinoma, HLD, hepatic steatosis, OSA on CPAP,  and uterine ca.  Clinical Impression    Pt admitted with above diagnosis.  Pt currently with functional limitations due to the deficits listed below (see PT Problem List). Pt in bed when PT arrived. Pt agreeable to therapy intervention. Pt required min cues, use of hospital bed features and S for supine to sit, sit to stand  from EOB with CGA, gait tasks in hallway 90 feet RW and CGA, pt reporting dizziness with gait subsided once seated. Pt left in recliner, all needs in place, encouraged to perform ankle pumps and request additional amb bout with nursing staff later in the day. Pt will benefit from acute skilled PT to increase their independence and safety with mobility to allow discharge.         If plan is discharge home, recommend the following: A little help with walking and/or transfers;A little help with bathing/dressing/bathroom;Assistance with cooking/housework;Assist for transportation   Can travel by private vehicle        Equipment Recommendations Other (comment) (TBD as pt progresses)  Recommendations for Other Services       Functional Status Assessment Patient has had a recent decline in their functional status and demonstrates the ability to make significant improvements in function in a reasonable and predictable amount of time.     Precautions / Restrictions Precautions Precautions:  Fall Restrictions Weight Bearing Restrictions Per Provider Order: No      Mobility  Bed Mobility Overal bed mobility: Needs Assistance Bed Mobility: Supine to Sit     Supine to sit: HOB elevated, Supervision     General bed mobility comments: min cues    Transfers Overall transfer level: Needs assistance Equipment used: Rolling walker (2 wheels) Transfers: Sit to/from Stand Sit to Stand: Contact guard assist           General transfer comment: min cues    Ambulation/Gait Ambulation/Gait assistance: Contact guard assist Gait Distance (Feet): 90 Feet Assistive device: Rolling walker (2 wheels) Gait Pattern/deviations: Step-through pattern Gait velocity: decreased     General Gait Details: slight trunk flexion with min cues for RW management, s/p 50 feet gait pt reported feeling dizzy, no c/o dizziness with positional changes  Stairs            Wheelchair Mobility     Tilt Bed    Modified Rankin (Stroke Patients Only)       Balance Overall balance assessment: Mild deficits observed, not formally tested                                           Pertinent Vitals/Pain Pain Assessment Pain Assessment: 0-10 Pain Score: 3  Pain Location: R goin Pain Descriptors / Indicators: Aching (pinching) Pain Intervention(s): Limited activity within patient's tolerance, Monitored during session, Repositioned    Home Living Family/patient expects to be discharged to:: Private residence Living Arrangements: Parent Available Help  at Discharge: Family Type of Home: House Home Access: Level entry       Home Layout: One level Home Equipment: Information systems manager - built in (DME belongs to mother)      Prior Function Prior Level of Function : Independent/Modified Independent;Driving             Mobility Comments: IND no AD for all ADLs, self care tasks and IADLs 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[employer] [facility]     Extremity/Trunk Assessment         Lower Extremity Assessment Lower Extremity Assessment: Overall WFL for tasks assessed    Cervical / Trunk Assessment Cervical / Trunk Assessment: Normal  Communication   Communication Communication: No apparent difficulties    Cognition Arousal: Alert Behavior During Therapy: WFL for tasks assessed/performed   PT - Cognitive impairments: No apparent impairments                         Following commands: Intact       Cueing       General Comments      Exercises General Exercises - Lower Extremity Ankle Circles/Pumps: AROM, Both, 15 reps   Assessment/Plan    PT Assessment Patient needs continued PT services  PT Problem List Decreased activity tolerance;Decreased mobility;Decreased coordination;Pain       PT Treatment Interventions DME instruction;Gait training;Functional mobility training;Therapeutic activities;Therapeutic exercise;Balance training;Neuromuscular re-education;Patient/family education    PT Goals (Current goals can be found in the Care Plan section)  Acute Rehab PT Goals Patient Stated Goal: to be able to be strong enough to help my mother out at home, return to water aerobics and travel PT Goal Formulation: With patient Time For Goal Achievement: 06/01/24 Potential to Achieve Goals: Good    Frequency Min 3X/week     Co-evaluation               AM-PAC PT 6 Clicks Mobility  Outcome Measure Help needed turning from your back to your side while in a flat bed without using bedrails?: None Help needed moving from lying on your back to sitting on the side of a flat bed without using bedrails?: None Help needed moving to and from a bed to a chair (including a wheelchair)?: A Little Help needed standing up from a chair using your arms (e.g., wheelchair or bedside chair)?: A Little Help needed to walk in hospital room?: A Little Help needed climbing 3-5 steps with a railing? : A Lot 6 Click Score: 19    End of Session  Equipment Utilized During Treatment: Gait belt Activity Tolerance: Patient tolerated treatment well;Other (comment) (dizziness) Patient left: in chair;with call bell/phone within reach Nurse Communication: Mobility status PT Visit Diagnosis: Unsteadiness on feet (R26.81);Other abnormalities of gait and mobility (R26.89);Muscle weakness (generalized) (M62.81);Difficulty in walking, not elsewhere classified (R26.2);Pain Pain - Right/Left: Right Pain - part of body: Leg    Time: 8963-8948 PT Time Calculation (min) (ACUTE ONLY): 15 min   Charges:   PT Evaluation $PT Eval Low Complexity: 1 Low   PT General Charges $$ ACUTE PT VISIT: 1 Visit         [place], PT Acute Rehab   [place] VEAR Drone 05/18/2024, 1:33 PM

## 2024-05-18 NOTE — Progress Notes (Signed)
 Referring Physician(s): Rogelia Satterfield  Supervising Physician: Johann Sieving  Patient Status:  [facility] - In-pt  Chief Complaint: acute lower gastrointestinal hemorrhage s/p provocative angiogram and embolization of distal branches of the left colic artery with cessation of bleeding with Dr Karalee on 05/16/2024   Subjective: Patient sitting in chair. Son at bedside. Patient reports new mild tenderness to right groin site she describes as a sharp sensation that is worse when she goes from a standing to sitting position and then gradually subsides. Denies pain or other concerns to left groin site.   Allergies: Macrobid  [nitrofurantoin ], Ciprofloxacin, Codeine, and Azithromycin  Medications: Prior to Admission medications   Medication Sig Start Date End Date Taking? Authorizing Provider  acetaminophen  (TYLENOL ) 500 MG tablet Take 500-1,000 mg by mouth See admin instructions. Take 1000 mg by mouth in the morning and 500 mg in the evening   Yes [provider]  albuterol  (VENTOLIN  HFA) 108 (90 Base) MCG/ACT inhaler Inhale 2 puffs into the lungs every 6 (six) hours as needed for wheezing or shortness of breath. 07/26/23  Yes Kara Dorn NOVAK, MD  aspirin  EC 81 MG tablet Take 81 mg by mouth daily. Swallow whole.   Yes [provider]  atorvastatin  (LIPITOR) 40 MG tablet Take 1 tablet (40 mg total) by mouth daily. 07/05/22  Yes Swinyer, Rosaline HERO, NP  budesonide -formoterol  (BREYNA ) 80-4.5 MCG/ACT inhaler Inhale 2 puffs into the lungs 2 (two) times daily as needed. Patient taking differently: Inhale 2 puffs into the lungs 2 (two) times daily as needed (wheezing and SOB). 07/26/23  Yes Kara Dorn NOVAK, MD  cholecalciferol  (VITAMIN D3) 25 MCG (1000 UNIT) tablet Take 1,000 Units by mouth daily.   Yes [provider]  clopidogrel  (PLAVIX ) 75 MG tablet Take 75 mg by mouth daily.   Yes [provider]  co-enzyme Q-10 30 MG capsule Take 30 mg by mouth daily.  09/17/21  Yes [provider]  Collagen-Vitamin C-Biotin (COLLAGEN PO) Take 1 Scoop by mouth daily.   Yes [provider]  estradiol  (ESTRACE  VAGINAL) 0.1 MG/GM vaginal cream Place 1 Applicatorful vaginally at bedtime. Patient taking differently: Place 1 Applicatorful vaginally 3 (three) times a week. 01/28/22  Yes Gorsuch, Ni, MD  ezetimibe  (ZETIA ) 10 MG tablet Take 10 mg by mouth daily.   Yes [provider]  hydrocortisone 2.5 % cream Apply 1 Application topically daily as needed. 10/14/21  Yes [provider]  loratadine  (CLARITIN ) 10 MG tablet Take 10 mg by mouth daily.   Yes [provider]  losartan  (COZAAR ) 50 MG tablet Take 50 mg by mouth daily. 10/02/21  Yes [provider]  Melatonin 5 MG CHEW Chew 20 mg by mouth at bedtime.   Yes [provider]  metFORMIN (GLUCOPHAGE) 500 MG tablet Take 500 mg by mouth 2 (two) times daily. 10/03/21  Yes [provider]  Omega 3 1000 MG CAPS Take 1 capsule by mouth daily.   Yes [provider]  omeprazole  (PRILOSEC) 40 MG capsule Take 40 mg by mouth daily.   Yes [provider]  OZEMPIC, 0.25 OR 0.5 MG/DOSE, 2 MG/3ML SOPN Inject 0.5 mg into the skin once a week. 08/23/23  Yes [provider]  polyethylene glycol (MIRALAX  / GLYCOLAX ) 17 g packet Take 17 g by mouth daily.   Yes [provider]  Probiotic Product (PROBIOTIC BLEND PO) Take 1 capsule by mouth daily.   Yes [provider]  UNABLE TO FIND Take 1 capsule  by mouth in the morning and at bedtime. Med Name: Turkey Tail Mushroom   Yes [provider]  Wheat Dextrin (BENEFIBER PO) Take 15 mLs by mouth in the morning and at bedtime.   Yes [provider]     Vital Signs: BP (!) 142/61 (BP Location: Right Arm)   Pulse 68   Temp 98 F (36.7 C) (Oral)   Resp 16   Ht 5' 4 (1.626 m)   Wt 240 lb 1.3 oz (108.9 kg)   SpO2 99%   BMI 41.21 kg/m   Physical  Exam Cardiovascular:     Rate and Rhythm: Normal rate.  Pulmonary:     Effort: Pulmonary effort is normal. No respiratory distress.  Skin:    General: Skin is warm.     Comments:  Right groin site- Mild tenderness to palpation Firm, hardened area approximately the size of a pea palpated No erythema, ecchymosis, drainage visualized  Left groin site- No erythema, ecchymosis, drainage to site Non-tender to palpation  Neurological:     Mental Status: She is alert and oriented to person, place, and time.  Psychiatric:        Mood and Affect: Mood normal.        Behavior: Behavior normal.     Imaging: IR Angiogram Visceral Selective Result Date: 05/17/2024 INDICATION: 73 year old female with ongoing acute lower GI bleed. CT arteriography was positive late yesterday and patient underwent angiography earlier this morning which was negative for evidence of active bleeding. Patient then did well throughout much of the day before developing recurrent bright red blood per rectum and need for additional transfusion this afternoon. CT arteriography was repeated and confirmed recurrent bleeding in the mid descending colon. Therefore, patient presents for repeat arteriography and embolization. EXAM: SELECTIVE VISCERAL ARTERIOGRAPHY; ADDITIONAL ARTERIOGRAPHY; IR EMBO ART VEN HEMORR LYMPH EXTRAV INC GUIDE ROADMAPPING; IR ULTRASOUND GUIDANCE VASC ACCESS LEFT MEDICATIONS: 5000 units of heparin  was administered intravenously by the Radiology nurse. 4 mg tPA and 200 mcg nitroglycerin  were administered intra-arterially by me. ANESTHESIA/SEDATION: Moderate (conscious) sedation was employed during this procedure. A total of Versed  2 mg and Fentanyl  200 mcg was administered intravenously by the Radiology nurse. Moderate Sedation Time: 98 minutes. The patient's level of consciousness and vital signs were monitored continuously by radiology nursing throughout the procedure under my direct supervision. CONTRAST:  50mL  OMNIPAQUE  IOHEXOL  300 MG/ML SOLN, 40mL OMNIPAQUE  IOHEXOL  300 MG/ML SOLN, 50mL OMNIPAQUE  IOHEXOL  300 MG/ML SOLN FLUOROSCOPY: Radiation Exposure Index (as provided by the fluoroscopic device): 2684 mGy Kerma COMPLICATIONS: SIR Level A - No therapy, no consequence. PROCEDURE: Informed consent was obtained from the patient following explanation of the procedure, risks, benefits and alternatives. The patient understands, agrees and consents for the procedure. All questions were addressed. A time out was performed prior to the initiation of the procedure. Maximal barrier sterile technique utilized including caps, mask, sterile gowns, sterile gloves, large sterile drape, hand hygiene, and Betadine prep. The left common femoral artery was interrogated with ultrasound and found to be widely patent. An image was obtained and stored for the medical record. Local anesthesia was attained by infiltration with 1% lidocaine . A small dermatotomy was made. Under real-time sonographic guidance, the vessel was punctured with a 21 gauge micropuncture needle. Using standard technique, the initial micro needle was exchanged over a 0.018 micro wire for a transitional 4 French micro sheath. The micro sheath was then exchanged over a 0.035 wire for a 5 French vascular sheath. Unfortunately, the department is  out of RIM catheters. Therefore a Sos Omni selective catheter was advanced over a Bentson wire into the abdominal aorta and formed. The catheter was used to select the origin of the IMA and arteriography was performed. No active bleeding on the initial angiogram. Given the clearly defined location of bleeding in the mid descending artery, a Cook cantata 2.5 French microcatheter was therefore used to select the left colic artery and arteriography was again performed. Again, no active bleeding. The microcatheter was further advanced into the descending branch of the left colic artery and arteriography was performed. No evidence of active  bleeding. The microcatheter was pulled back and advanced into the ascending branch of the left colic artery and arteriography was performed. No evidence of active bleeding. Anatomic leak, the region of colon most corresponding to the recent visualized bleed on CT arteriography is supplied by the descending branch of the left colic artery. Therefore, the microcatheter was again advanced into the descending branch of the left colic artery and advanced distally just proximal to its bifurcation into the many Vasa recta branches. At this time, heparin  was administered intravenously. TPA was prepared and 4 mg of tPA was administered through the catheter followed by a total of 200 mcg of nitroglycerin . After several minutes additional arteriography was performed. This time, active bleeding is visualized from 1 of the tortuous distal branches. The microcatheter was used to select several of the small branches in till the active bleeding was successfully visualized. At this time, coil embolization was performed using a series of detachable penumbra microcoils. Follow-up arteriography demonstrates no further active bleeding. The microcatheter was removed. Contrast was injected through the base catheter at the origin of the IMA. Unfortunately, this resulted in a small iatrogenic dissection of the proximal IMA. The catheter was moved and repeat injection performed gingerly. The dissection is not flow limiting. This is likely of no consequence and should heal without any further intervention. IMPRESSION: 1. Successful provocative mesenteric angiogram with recreation and visualization of the site of bleeding. 2. Successful coil embolization of distal branches of the descending branch of the left colic artery with cessation of bleeding. 3. 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 This is likely of no consequence and should heal without any further intervention. Electronically Signed   By: Wilkie Lent M.D.   On: 05/17/2024 07:29   IR US  Guide Vasc Access Left Result Date: 05/17/2024 INDICATION: 73 year old female with ongoing acute lower GI bleed. CT arteriography was positive late yesterday and patient underwent angiography earlier this morning which was negative for evidence of active bleeding. Patient then did well throughout much of the day before developing recurrent bright red blood per rectum and need for additional transfusion this afternoon. CT arteriography was repeated and confirmed recurrent bleeding in the mid descending colon. Therefore, patient presents for repeat arteriography and embolization. EXAM: SELECTIVE VISCERAL ARTERIOGRAPHY; ADDITIONAL ARTERIOGRAPHY; IR EMBO ART VEN HEMORR LYMPH EXTRAV INC GUIDE ROADMAPPING; IR ULTRASOUND GUIDANCE VASC ACCESS LEFT MEDICATIONS: 5000 units of heparin  was administered intravenously by the Radiology nurse. 4 mg tPA and 200 mcg nitroglycerin  were administered intra-arterially by me. ANESTHESIA/SEDATION: Moderate (conscious) sedation was employed during this procedure. A total of Versed  2 mg and Fentanyl  200 mcg was administered intravenously by the Radiology nurse. Moderate Sedation Time: 98 minutes. The patient's level of consciousness and vital signs were monitored continuously by radiology nursing throughout the procedure under my direct supervision. CONTRAST:  50mL OMNIPAQUE  IOHEXOL  300 MG/ML SOLN, 40mL OMNIPAQUE  IOHEXOL   300 MG/ML SOLN, 50mL OMNIPAQUE  IOHEXOL  300 MG/ML SOLN FLUOROSCOPY: Radiation Exposure Index (as provided by the fluoroscopic device): 2684 mGy Kerma COMPLICATIONS: SIR Level A - No therapy, no consequence. PROCEDURE: Informed consent was obtained from the patient following explanation of the procedure, risks, benefits and alternatives. The patient understands, agrees and consents for the procedure. All questions were addressed. A time out was performed prior to the initiation of the procedure. Maximal barrier sterile  technique utilized including caps, mask, sterile gowns, sterile gloves, large sterile drape, hand hygiene, and Betadine prep. The left common femoral artery was interrogated with ultrasound and found to be widely patent. An image was obtained and stored for the medical record. Local anesthesia was attained by infiltration with 1% lidocaine . A small dermatotomy was made. Under real-time sonographic guidance, the vessel was punctured with a 21 gauge micropuncture needle. Using standard technique, the initial micro needle was exchanged over a 0.018 micro wire for a transitional 4 French micro sheath. The micro sheath was then exchanged over a 0.035 wire for a 5 French vascular sheath. Unfortunately, the department is out of RIM catheters. Therefore a Sos Omni selective catheter was advanced over a Bentson wire into the abdominal aorta and formed. The catheter was used to select the origin of the IMA and arteriography was performed. No active bleeding on the initial angiogram. Given the clearly defined location of bleeding in the mid descending artery, a Cook cantata 2.5 French microcatheter was therefore used to select the left colic artery and arteriography was again performed. Again, no active bleeding. The microcatheter was further advanced into the descending branch of the left colic artery and arteriography was performed. No evidence of active bleeding. The microcatheter was pulled back and advanced into the ascending branch of the left colic artery and arteriography was performed. No evidence of active bleeding. Anatomic leak, the region of colon most corresponding to the recent visualized bleed on CT arteriography is supplied by the descending branch of the left colic artery. Therefore, the microcatheter was again advanced into the descending branch of the left colic artery and advanced distally just proximal to its bifurcation into the many Vasa recta branches. At this time, heparin  was administered  intravenously. TPA was prepared and 4 mg of tPA was administered through the catheter followed by a total of 200 mcg of nitroglycerin . After several minutes additional arteriography was performed. This time, active bleeding is visualized from 1 of the tortuous distal branches. The microcatheter was used to select several of the small branches in till the active bleeding was successfully visualized. At this time, coil embolization was performed using a series of detachable penumbra microcoils. Follow-up arteriography demonstrates no further active bleeding. The microcatheter was removed. Contrast was injected through the base catheter at the origin of the IMA. Unfortunately, this resulted in a small iatrogenic dissection of the proximal IMA. The catheter was moved and repeat injection performed gingerly. The dissection is not flow limiting. This is likely of no consequence and should heal without any further intervention. IMPRESSION: 1. Successful provocative mesenteric angiogram with recreation and visualization of the site of bleeding. 2. Successful coil embolization of distal branches of the descending branch of the left colic artery with cessation of bleeding. 3. 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 This is likely of no consequence and should heal without any further intervention. Electronically Signed   By: Wilkie Lent M.D.   On: 05/17/2024 07:29   IR EMBO ART  VEN HEMORR  LYMPH EXTRAV  INC GUIDE ROADMAPPING Result Date: 05/17/2024 INDICATION: 73 year old female with ongoing acute lower GI bleed. CT arteriography was positive late yesterday and patient underwent angiography earlier this morning which was negative for evidence of active bleeding. Patient then did well throughout much of the day before developing recurrent bright red blood per rectum and need for additional transfusion this afternoon. CT arteriography was repeated and confirmed recurrent bleeding in the mid  descending colon. Therefore, patient presents for repeat arteriography and embolization. EXAM: SELECTIVE VISCERAL ARTERIOGRAPHY; ADDITIONAL ARTERIOGRAPHY; IR EMBO ART VEN HEMORR LYMPH EXTRAV INC GUIDE ROADMAPPING; IR ULTRASOUND GUIDANCE VASC ACCESS LEFT MEDICATIONS: 5000 units of heparin  was administered intravenously by the Radiology nurse. 4 mg tPA and 200 mcg nitroglycerin  were administered intra-arterially by me. ANESTHESIA/SEDATION: Moderate (conscious) sedation was employed during this procedure. A total of Versed  2 mg and Fentanyl  200 mcg was administered intravenously by the Radiology nurse. Moderate Sedation Time: 98 minutes. The patient's level of consciousness and vital signs were monitored continuously by radiology nursing throughout the procedure under my direct supervision. CONTRAST:  50mL OMNIPAQUE  IOHEXOL  300 MG/ML SOLN, 40mL OMNIPAQUE  IOHEXOL  300 MG/ML SOLN, 50mL OMNIPAQUE  IOHEXOL  300 MG/ML SOLN FLUOROSCOPY: Radiation Exposure Index (as provided by the fluoroscopic device): 2684 mGy Kerma COMPLICATIONS: SIR Level A - No therapy, no consequence. PROCEDURE: Informed consent was obtained from the patient following explanation of the procedure, risks, benefits and alternatives. The patient understands, agrees and consents for the procedure. All questions were addressed. A time out was performed prior to the initiation of the procedure. Maximal barrier sterile technique utilized including caps, mask, sterile gowns, sterile gloves, large sterile drape, hand hygiene, and Betadine prep. The left common femoral artery was interrogated with ultrasound and found to be widely patent. An image was obtained and stored for the medical record. Local anesthesia was attained by infiltration with 1% lidocaine . A small dermatotomy was made. Under real-time sonographic guidance, the vessel was punctured with a 21 gauge micropuncture needle. Using standard technique, the initial micro needle was exchanged over a 0.018  micro wire for a transitional 4 French micro sheath. The micro sheath was then exchanged over a 0.035 wire for a 5 French vascular sheath. Unfortunately, the department is out of RIM catheters. Therefore a Sos Omni selective catheter was advanced over a Bentson wire into the abdominal aorta and formed. The catheter was used to select the origin of the IMA and arteriography was performed. No active bleeding on the initial angiogram. Given the clearly defined location of bleeding in the mid descending artery, a Cook cantata 2.5 French microcatheter was therefore used to select the left colic artery and arteriography was again performed. Again, no active bleeding. The microcatheter was further advanced into the descending branch of the left colic artery and arteriography was performed. No evidence of active bleeding. The microcatheter was pulled back and advanced into the ascending branch of the left colic artery and arteriography was performed. No evidence of active bleeding. Anatomic leak, the region of colon most corresponding to the recent visualized bleed on CT arteriography is supplied by the descending branch of the left colic artery. Therefore, the microcatheter was again advanced into the descending branch of the left colic artery and advanced distally just proximal to its bifurcation into the many Vasa recta branches. At this time, heparin  was administered intravenously. TPA was prepared and 4 mg of tPA was administered through the catheter followed by a total of 200 mcg of nitroglycerin . After several minutes additional arteriography  was performed. This time, active bleeding is visualized from 1 of the tortuous distal branches. The microcatheter was used to select several of the small branches in till the active bleeding was successfully visualized. At this time, coil embolization was performed using a series of detachable penumbra microcoils. Follow-up arteriography demonstrates no further active bleeding.  The microcatheter was removed. Contrast was injected through the base catheter at the origin of the IMA. Unfortunately, this resulted in a small iatrogenic dissection of the proximal IMA. The catheter was moved and repeat injection performed gingerly. The dissection is not flow limiting. This is likely of no consequence and should heal without any further intervention. IMPRESSION: 1. Successful provocative mesenteric angiogram with recreation and visualization of the site of bleeding. 2. Successful coil embolization of distal branches of the descending branch of the left colic artery with cessation of bleeding. 3. 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 This is likely of no consequence and should heal without any further intervention. Electronically Signed   By: Wilkie Lent M.D.   On: 05/17/2024 07:29   IR Angiogram Selective Each Additional Vessel Result Date: 05/17/2024 INDICATION: 73 year old female with ongoing acute lower GI bleed. CT arteriography was positive late yesterday and patient underwent angiography earlier this morning which was negative for evidence of active bleeding. Patient then did well throughout much of the day before developing recurrent bright red blood per rectum and need for additional transfusion this afternoon. CT arteriography was repeated and confirmed recurrent bleeding in the mid descending colon. Therefore, patient presents for repeat arteriography and embolization. EXAM: SELECTIVE VISCERAL ARTERIOGRAPHY; ADDITIONAL ARTERIOGRAPHY; IR EMBO ART VEN HEMORR LYMPH EXTRAV INC GUIDE ROADMAPPING; IR ULTRASOUND GUIDANCE VASC ACCESS LEFT MEDICATIONS: 5000 units of heparin  was administered intravenously by the Radiology nurse. 4 mg tPA and 200 mcg nitroglycerin  were administered intra-arterially by me. ANESTHESIA/SEDATION: Moderate (conscious) sedation was employed during this procedure. A total of Versed  2 mg and Fentanyl  200 mcg was administered  intravenously by the Radiology nurse. Moderate Sedation Time: 98 minutes. The patient's level of consciousness and vital signs were monitored continuously by radiology nursing throughout the procedure under my direct supervision. CONTRAST:  50mL OMNIPAQUE  IOHEXOL  300 MG/ML SOLN, 40mL OMNIPAQUE  IOHEXOL  300 MG/ML SOLN, 50mL OMNIPAQUE  IOHEXOL  300 MG/ML SOLN FLUOROSCOPY: Radiation Exposure Index (as provided by the fluoroscopic device): 2684 mGy Kerma COMPLICATIONS: SIR Level A - No therapy, no consequence. PROCEDURE: Informed consent was obtained from the patient following explanation of the procedure, risks, benefits and alternatives. The patient understands, agrees and consents for the procedure. All questions were addressed. A time out was performed prior to the initiation of the procedure. Maximal barrier sterile technique utilized including caps, mask, sterile gowns, sterile gloves, large sterile drape, hand hygiene, and Betadine prep. The left common femoral artery was interrogated with ultrasound and found to be widely patent. An image was obtained and stored for the medical record. Local anesthesia was attained by infiltration with 1% lidocaine . A small dermatotomy was made. Under real-time sonographic guidance, the vessel was punctured with a 21 gauge micropuncture needle. Using standard technique, the initial micro needle was exchanged over a 0.018 micro wire for a transitional 4 French micro sheath. The micro sheath was then exchanged over a 0.035 wire for a 5 French vascular sheath. Unfortunately, the department is out of RIM catheters. Therefore a Sos Omni selective catheter was advanced over a Bentson wire into the abdominal aorta and formed. The catheter was used to select the origin of  the IMA and arteriography was performed. No active bleeding on the initial angiogram. Given the clearly defined location of bleeding in the mid descending artery, a Cook cantata 2.5 French microcatheter was therefore used  to select the left colic artery and arteriography was again performed. Again, no active bleeding. The microcatheter was further advanced into the descending branch of the left colic artery and arteriography was performed. No evidence of active bleeding. The microcatheter was pulled back and advanced into the ascending branch of the left colic artery and arteriography was performed. No evidence of active bleeding. Anatomic leak, the region of colon most corresponding to the recent visualized bleed on CT arteriography is supplied by the descending branch of the left colic artery. Therefore, the microcatheter was again advanced into the descending branch of the left colic artery and advanced distally just proximal to its bifurcation into the many Vasa recta branches. At this time, heparin  was administered intravenously. TPA was prepared and 4 mg of tPA was administered through the catheter followed by a total of 200 mcg of nitroglycerin . After several minutes additional arteriography was performed. This time, active bleeding is visualized from 1 of the tortuous distal branches. The microcatheter was used to select several of the small branches in till the active bleeding was successfully visualized. At this time, coil embolization was performed using a series of detachable penumbra microcoils. Follow-up arteriography demonstrates no further active bleeding. The microcatheter was removed. Contrast was injected through the base catheter at the origin of the IMA. Unfortunately, this resulted in a small iatrogenic dissection of the proximal IMA. The catheter was moved and repeat injection performed gingerly. The dissection is not flow limiting. This is likely of no consequence and should heal without any further intervention. IMPRESSION: 1. Successful provocative mesenteric angiogram with recreation and visualization of the site of bleeding. 2. Successful coil embolization of distal branches of the descending branch of the  left colic artery with cessation of bleeding. 3. 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 This is likely of no consequence and should heal without any further intervention. Electronically Signed   By: Wilkie Lent M.D.   On: 05/17/2024 07:29   IR Angiogram Selective Each Additional Vessel Result Date: 05/17/2024 INDICATION: 73 year old female with ongoing acute lower GI bleed. CT arteriography was positive late yesterday and patient underwent angiography earlier this morning which was negative for evidence of active bleeding. Patient then did well throughout much of the day before developing recurrent bright red blood per rectum and need for additional transfusion this afternoon. CT arteriography was repeated and confirmed recurrent bleeding in the mid descending colon. Therefore, patient presents for repeat arteriography and embolization. EXAM: SELECTIVE VISCERAL ARTERIOGRAPHY; ADDITIONAL ARTERIOGRAPHY; IR EMBO ART VEN HEMORR LYMPH EXTRAV INC GUIDE ROADMAPPING; IR ULTRASOUND GUIDANCE VASC ACCESS LEFT MEDICATIONS: 5000 units of heparin  was administered intravenously by the Radiology nurse. 4 mg tPA and 200 mcg nitroglycerin  were administered intra-arterially by me. ANESTHESIA/SEDATION: Moderate (conscious) sedation was employed during this procedure. A total of Versed  2 mg and Fentanyl  200 mcg was administered intravenously by the Radiology nurse. Moderate Sedation Time: 98 minutes. The patient's level of consciousness and vital signs were monitored continuously by radiology nursing throughout the procedure under my direct supervision. CONTRAST:  50mL OMNIPAQUE  IOHEXOL  300 MG/ML SOLN, 40mL OMNIPAQUE  IOHEXOL  300 MG/ML SOLN, 50mL OMNIPAQUE  IOHEXOL  300 MG/ML SOLN FLUOROSCOPY: Radiation Exposure Index (as provided by the fluoroscopic device): 2684 mGy Kerma COMPLICATIONS: SIR Level A - No therapy, no consequence.  PROCEDURE: Informed consent was obtained from the patient following  explanation of the procedure, risks, benefits and alternatives. The patient understands, agrees and consents for the procedure. All questions were addressed. A time out was performed prior to the initiation of the procedure. Maximal barrier sterile technique utilized including caps, mask, sterile gowns, sterile gloves, large sterile drape, hand hygiene, and Betadine prep. The left common femoral artery was interrogated with ultrasound and found to be widely patent. An image was obtained and stored for the medical record. Local anesthesia was attained by infiltration with 1% lidocaine . A small dermatotomy was made. Under real-time sonographic guidance, the vessel was punctured with a 21 gauge micropuncture needle. Using standard technique, the initial micro needle was exchanged over a 0.018 micro wire for a transitional 4 French micro sheath. The micro sheath was then exchanged over a 0.035 wire for a 5 French vascular sheath. Unfortunately, the department is out of RIM catheters. Therefore a Sos Omni selective catheter was advanced over a Bentson wire into the abdominal aorta and formed. The catheter was used to select the origin of the IMA and arteriography was performed. No active bleeding on the initial angiogram. Given the clearly defined location of bleeding in the mid descending artery, a Cook cantata 2.5 French microcatheter was therefore used to select the left colic artery and arteriography was again performed. Again, no active bleeding. The microcatheter was further advanced into the descending branch of the left colic artery and arteriography was performed. No evidence of active bleeding. The microcatheter was pulled back and advanced into the ascending branch of the left colic artery and arteriography was performed. No evidence of active bleeding. Anatomic leak, the region of colon most corresponding to the recent visualized bleed on CT arteriography is supplied by the descending branch of the left colic  artery. Therefore, the microcatheter was again advanced into the descending branch of the left colic artery and advanced distally just proximal to its bifurcation into the many Vasa recta branches. At this time, heparin  was administered intravenously. TPA was prepared and 4 mg of tPA was administered through the catheter followed by a total of 200 mcg of nitroglycerin . After several minutes additional arteriography was performed. This time, active bleeding is visualized from 1 of the tortuous distal branches. The microcatheter was used to select several of the small branches in till the active bleeding was successfully visualized. At this time, coil embolization was performed using a series of detachable penumbra microcoils. Follow-up arteriography demonstrates no further active bleeding. The microcatheter was removed. Contrast was injected through the base catheter at the origin of the IMA. Unfortunately, this resulted in a small iatrogenic dissection of the proximal IMA. The catheter was moved and repeat injection performed gingerly. The dissection is not flow limiting. This is likely of no consequence and should heal without any further intervention. IMPRESSION: 1. Successful provocative mesenteric angiogram with recreation and visualization of the site of bleeding. 2. Successful coil embolization of distal branches of the descending branch of the left colic artery with cessation of bleeding. 3. 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 This is likely of no consequence and should heal without any further intervention. Electronically Signed   By: Wilkie Lent M.D.   On: 05/17/2024 07:29   CT ANGIO GI BLEED Result Date: 05/16/2024 CLINICAL DATA:  Active rectal bleeding EXAM: CTA ABDOMEN AND PELVIS WITHOUT AND WITH CONTRAST TECHNIQUE: Multidetector CT imaging of the abdomen and pelvis was performed using the standard protocol  during bolus administration of intravenous contrast.  Multiplanar reconstructed images and MIPs were obtained and reviewed to evaluate the vascular anatomy. RADIATION DOSE REDUCTION: This exam was performed according to the departmental dose-optimization program which includes automated exposure control, adjustment of the mA and/or kV according to patient size and/or use of iterative reconstruction technique. CONTRAST:  OMNIPAQUE  IOHEXOL  350 MG/ML SOLN COMPARISON:  05/16/2024, 05/15/2024 FINDINGS: VASCULAR Aorta: Normal caliber aorta without aneurysm, dissection, vasculitis or significant stenosis. Minimal atherosclerosis. Celiac: Patent without evidence of aneurysm, dissection, vasculitis or significant stenosis. SMA: Patent without evidence of aneurysm, dissection, vasculitis or significant stenosis. Renals: Both renal arteries are patent without evidence of aneurysm, dissection, vasculitis, fibromuscular dysplasia or significant stenosis. Mild atherosclerosis of the left renal artery. IMA: Patent without evidence of aneurysm, dissection, vasculitis or significant stenosis. Inflow: Patent without evidence of aneurysm, dissection, vasculitis or significant stenosis. Mild atherosclerosis. Proximal Outflow: Bilateral common femoral and visualized portions of the superficial and profunda femoral arteries are patent without evidence of aneurysm, dissection, vasculitis or significant stenosis. Postprocedural changes are seen within the right common femoral artery related to recent visceral arteriogram. There is a 0.8 x 0.7 x 1.1 cm pseudoaneurysm off the ventral margin of the common femoral artery, with a patent narrow neck measuring 0.3 cm supplying the pseudoaneurysm. Mild surrounding subcutaneous fat stranding. Veins: No obvious venous abnormality within the limitations of this arterial phase study. Review of the MIP images confirms the above findings. NON-VASCULAR Lower chest: No acute pleural or parenchymal lung disease. Hepatobiliary: Gallbladder sludge versus  vicarious excretion of previously administered contrast. No cholecystitis. The liver is unremarkable. Pancreas: Unremarkable. No pancreatic ductal dilatation or surrounding inflammatory changes. Spleen: Normal in size without focal abnormality. Adrenals/Urinary Tract: Adrenal glands are unremarkable. Kidneys are normal, without renal calculi, focal lesion, or hydronephrosis. Nonspecific gas in the bladder lumen, please correlate with any recent instrumentation or catheterization. Stomach/Bowel: There is intraluminal accumulation of contrast along the mesenteric wall of the mid descending colon, reference axial images 95 through 108 of series 6. This is in a similar distribution to where the active hemorrhage was reported on prior CTA, and is more subtle on this exam than prior. No other areas of active gastrointestinal hemorrhage are identified. No bowel obstruction or ileus. Scattered gas fluid levels throughout the colon may reflect diarrhea. Small hiatal hernia. Lymphatic: No pathologic adenopathy. Reproductive: Status post hysterectomy. No adnexal masses. Other: No free fluid or free intraperitoneal gas. 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 No incarceration or obstruction. Musculoskeletal: No acute or destructive bony abnormalities. Reconstructed images demonstrate no additional findings. IMPRESSION: VASCULAR 1. Active gastrointestinal hemorrhage along the mesenteric wall of the mid descending colon, in a similar location where the active hemorrhage with seen on previous CT angiography procedure. Visceral angiogram performed earlier today did not demonstrate active hemorrhage in this region however. 2. 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 3.  Aortic Atherosclerosis (ICD10-I70.0). NON-VASCULAR 1. Colonic gas fluid levels consistent with diarrhea. 2. Stable midline  ventral hernia. 3. Gallbladder sludge versus vicarious excretion of contrast. No evidence of acute cholecystitis. Critical Value/emergent results were called by telephone at the time of interpretation on 05/16/2024 at 6:52 pm to provider [facility] , who verbally acknowledged these results. Electronically Signed   By: Ozell Daring M.D.   On: 05/16/2024 18:59   IR Angiogram Visceral Selective Result Date: 05/16/2024 INDICATION: 73 year old  female with history of acute lower gastrointestinal hemorrhage likely secondary to diverticulosis with associated anemia. EXAM: IR ULTRASOUND GUIDANCE VASC ACCESS RIGHT; ADDITIONAL ARTERIOGRAPHY; SELECTIVE VISCERAL ARTERIOGRAPHY MEDICATIONS: None. ANESTHESIA/SEDATION: Moderate (conscious) sedation was employed during this procedure. A total of Versed  2 mg and Fentanyl  100 mcg was administered intravenously. Moderate Sedation Time: 20 minutes. The patient's level of consciousness and vital signs were monitored continuously by radiology nursing throughout the procedure under my direct supervision. CONTRAST:  OMNIPAQUE  IOHEXOL  350 MG/ML SOLN, 25mL OMNIPAQUE  IOHEXOL  300 MG/ML SOLN, 25mL OMNIPAQUE  IOHEXOL  300 MG/ML SOLN FLUOROSCOPY: Radiation Exposure Index (as provided by the fluoroscopic device): 613 mGy reference air Kerma COMPLICATIONS: None immediate. PROCEDURE: Informed consent was obtained from the patient following explanation of the procedure, risks, benefits and alternatives. The patient understands, agrees and consents for the procedure. All questions were addressed. A time out was performed prior to the initiation of the procedure. Maximal barrier sterile technique utilized including caps, mask, sterile gowns, sterile gloves, large sterile drape, hand hygiene, and Betadine prep. Preprocedure ultrasound evaluation of the right common femoral artery demonstrated patency. The procedure was planned. Subdermal Local anesthesia was administered at the planned needle  entry site with 1% lidocaine . A small skin nick was made. Under direct ultrasound visualization, a 21 gauge micropuncture needle was directed into the right common femoral artery. A permanent cm captured and stored in the record. A micropuncture sheath was then introduced through which a limited right lower extremity angiogram was performed which demonstrated adequate puncture site for closure device use. A Bentson wire was directed to the abdominal aorta over which the micropuncture sheath was exchanged for a 5 French vascular sheath. A 5 French rim catheter was introduced over the wire and used to select the inferior mesenteric artery. Inferior mesenteric angiogram was performed which demonstrated patency throughout with arterial supply to the descending and sigmoid colon. There was no obvious extravasation. A 2.4 French Progreat microcatheter and fathom 16 microwire were then used to select the left colic artery middle branch. Dedicated angiogram was performed at this location which demonstrated no evidence of active extravasation in the descending colon. A more distal, descending branch of the left colic artery was selected and repeat angiogram was performed. Again, no evidence of active extravasation was seen at this site. The catheter was redirected to a superior left colic branch and repeat angiogram was performed at this location. Again, no evidence of active extravasation was visualized. Microcatheter was then retracted to the proximal inferior mesenteric artery and repeat angiogram was performed which demonstrated no evidence of active extravasation. The catheters were removed. The 5 French sheath was exchanged for 6 French Angio-Seal device which was deployed successfully without complication. Peripheral pulses were unchanged. A sterile bandage was applied. The patient tolerated the procedure well was transferred back to the ED in good condition. IMPRESSION: Technically successful inferior mesenteric  angiogram with selective angiography of the left colic artery branches without evidence of active extravasation. No embolization was performed. Ester Sides, MD [facility] Radiology Electronically Signed   By: Ester Sides M.D.   On: 05/16/2024 11:17   IR US  Guide Vasc Access Right Result Date: 05/16/2024 INDICATION: 73 year old female with history of acute lower gastrointestinal hemorrhage likely secondary to diverticulosis with associated anemia. EXAM: IR ULTRASOUND GUIDANCE VASC ACCESS RIGHT; ADDITIONAL ARTERIOGRAPHY; SELECTIVE VISCERAL ARTERIOGRAPHY MEDICATIONS: None. ANESTHESIA/SEDATION: Moderate (conscious) sedation was employed during this procedure. A total of Versed  2 mg and Fentanyl  100 mcg was administered intravenously. Moderate  Sedation Time: 20 minutes. The patient's level of consciousness and vital signs were monitored continuously by radiology nursing throughout the procedure under my direct supervision. CONTRAST:  OMNIPAQUE  IOHEXOL  350 MG/ML SOLN, 25mL OMNIPAQUE  IOHEXOL  300 MG/ML SOLN, 25mL OMNIPAQUE  IOHEXOL  300 MG/ML SOLN FLUOROSCOPY: Radiation Exposure Index (as provided by the fluoroscopic device): 613 mGy reference air Kerma COMPLICATIONS: None immediate. PROCEDURE: Informed consent was obtained from the patient following explanation of the procedure, risks, benefits and alternatives. The patient understands, agrees and consents for the procedure. All questions were addressed. A time out was performed prior to the initiation of the procedure. Maximal barrier sterile technique utilized including caps, mask, sterile gowns, sterile gloves, large sterile drape, hand hygiene, and Betadine prep. Preprocedure ultrasound evaluation of the right common femoral artery demonstrated patency. The procedure was planned. Subdermal Local anesthesia was administered at the planned needle entry site with 1% lidocaine . A small skin nick was made. Under  direct ultrasound visualization, a 21 gauge micropuncture needle was directed into the right common femoral artery. A permanent cm captured and stored in the record. A micropuncture sheath was then introduced through which a limited right lower extremity angiogram was performed which demonstrated adequate puncture site for closure device use. A Bentson wire was directed to the abdominal aorta over which the micropuncture sheath was exchanged for a 5 French vascular sheath. A 5 French rim catheter was introduced over the wire and used to select the inferior mesenteric artery. Inferior mesenteric angiogram was performed which demonstrated patency throughout with arterial supply to the descending and sigmoid colon. There was no obvious extravasation. A 2.4 French Progreat microcatheter and fathom 16 microwire were then used to select the left colic artery middle branch. Dedicated angiogram was performed at this location which demonstrated no evidence of active extravasation in the descending colon. A more distal, descending branch of the left colic artery was selected and repeat angiogram was performed. Again, no evidence of active extravasation was seen at this site. The catheter was redirected to a superior left colic branch and repeat angiogram was performed at this location. Again, no evidence of active extravasation was visualized. Microcatheter was then retracted to the proximal inferior mesenteric artery and repeat angiogram was performed which demonstrated no evidence of active extravasation. The catheters were removed. The 5 French sheath was exchanged for 6 French Angio-Seal device which was deployed successfully without complication. Peripheral pulses were unchanged. A sterile bandage was applied. The patient tolerated the procedure well was transferred back to the ED in good condition. IMPRESSION: Technically successful inferior mesenteric angiogram with selective angiography of the left colic artery  branches without evidence of active extravasation. No embolization was performed. Ester Sides, MD [facility] Radiology Electronically Signed   By: Ester Sides M.D.   On: 05/16/2024 11:17   IR Angiogram Selective Each Additional Vessel Result Date: 05/16/2024 INDICATION: 73 year old female with history of acute lower gastrointestinal hemorrhage likely secondary to diverticulosis with associated anemia. EXAM: IR ULTRASOUND GUIDANCE VASC ACCESS RIGHT; ADDITIONAL ARTERIOGRAPHY; SELECTIVE VISCERAL ARTERIOGRAPHY MEDICATIONS: None. ANESTHESIA/SEDATION: Moderate (conscious) sedation was employed during this procedure. A total of Versed  2 mg and Fentanyl  100 mcg was administered intravenously. Moderate Sedation Time: 20 minutes. The patient's level of consciousness and vital signs were monitored continuously by radiology nursing throughout the procedure under my direct supervision. CONTRAST:  OMNIPAQUE  IOHEXOL  350 MG/ML SOLN, 25mL OMNIPAQUE  IOHEXOL  300 MG/ML SOLN, 25mL OMNIPAQUE  IOHEXOL  300 MG/ML SOLN FLUOROSCOPY: Radiation Exposure Index (as provided by  the fluoroscopic device): 613 mGy reference air Kerma COMPLICATIONS: None immediate. PROCEDURE: Informed consent was obtained from the patient following explanation of the procedure, risks, benefits and alternatives. The patient understands, agrees and consents for the procedure. All questions were addressed. A time out was performed prior to the initiation of the procedure. Maximal barrier sterile technique utilized including caps, mask, sterile gowns, sterile gloves, large sterile drape, hand hygiene, and Betadine prep. Preprocedure ultrasound evaluation of the right common femoral artery demonstrated patency. The procedure was planned. Subdermal Local anesthesia was administered at the planned needle entry site with 1% lidocaine . A small skin nick was made. Under direct ultrasound visualization, a 21 gauge  micropuncture needle was directed into the right common femoral artery. A permanent cm captured and stored in the record. A micropuncture sheath was then introduced through which a limited right lower extremity angiogram was performed which demonstrated adequate puncture site for closure device use. A Bentson wire was directed to the abdominal aorta over which the micropuncture sheath was exchanged for a 5 French vascular sheath. A 5 French rim catheter was introduced over the wire and used to select the inferior mesenteric artery. Inferior mesenteric angiogram was performed which demonstrated patency throughout with arterial supply to the descending and sigmoid colon. There was no obvious extravasation. A 2.4 French Progreat microcatheter and fathom 16 microwire were then used to select the left colic artery middle branch. Dedicated angiogram was performed at this location which demonstrated no evidence of active extravasation in the descending colon. A more distal, descending branch of the left colic artery was selected and repeat angiogram was performed. Again, no evidence of active extravasation was seen at this site. The catheter was redirected to a superior left colic branch and repeat angiogram was performed at this location. Again, no evidence of active extravasation was visualized. Microcatheter was then retracted to the proximal inferior mesenteric artery and repeat angiogram was performed which demonstrated no evidence of active extravasation. The catheters were removed. The 5 French sheath was exchanged for 6 French Angio-Seal device which was deployed successfully without complication. Peripheral pulses were unchanged. A sterile bandage was applied. The patient tolerated the procedure well was transferred back to the ED in good condition. IMPRESSION: Technically successful inferior mesenteric angiogram with selective angiography of the left colic artery branches without evidence of active extravasation.  No embolization was performed. Ester Sides, MD [facility] Radiology Electronically Signed   By: Ester Sides M.D.   On: 05/16/2024 11:17   IR Angiogram Selective Each Additional Vessel Result Date: 05/16/2024 INDICATION: 73 year old female with history of acute lower gastrointestinal hemorrhage likely secondary to diverticulosis with associated anemia. EXAM: IR ULTRASOUND GUIDANCE VASC ACCESS RIGHT; ADDITIONAL ARTERIOGRAPHY; SELECTIVE VISCERAL ARTERIOGRAPHY MEDICATIONS: None. ANESTHESIA/SEDATION: Moderate (conscious) sedation was employed during this procedure. A total of Versed  2 mg and Fentanyl  100 mcg was administered intravenously. Moderate Sedation Time: 20 minutes. The patient's level of consciousness and vital signs were monitored continuously by radiology nursing throughout the procedure under my direct supervision. CONTRAST:  OMNIPAQUE  IOHEXOL  350 MG/ML SOLN, 25mL OMNIPAQUE  IOHEXOL  300 MG/ML SOLN, 25mL OMNIPAQUE  IOHEXOL  300 MG/ML SOLN FLUOROSCOPY: Radiation Exposure Index (as provided by the fluoroscopic device): 613 mGy reference air Kerma COMPLICATIONS: None immediate. PROCEDURE: Informed consent was obtained from the patient following explanation of the procedure, risks, benefits and alternatives. The patient understands, agrees and consents for the procedure. All questions were addressed. A time out was performed prior to the initiation of the  procedure. Maximal barrier sterile technique utilized including caps, mask, sterile gowns, sterile gloves, large sterile drape, hand hygiene, and Betadine prep. Preprocedure ultrasound evaluation of the right common femoral artery demonstrated patency. The procedure was planned. Subdermal Local anesthesia was administered at the planned needle entry site with 1% lidocaine . A small skin nick was made. Under direct ultrasound visualization, a 21 gauge micropuncture needle was directed into the right  common femoral artery. A permanent cm captured and stored in the record. A micropuncture sheath was then introduced through which a limited right lower extremity angiogram was performed which demonstrated adequate puncture site for closure device use. A Bentson wire was directed to the abdominal aorta over which the micropuncture sheath was exchanged for a 5 French vascular sheath. A 5 French rim catheter was introduced over the wire and used to select the inferior mesenteric artery. Inferior mesenteric angiogram was performed which demonstrated patency throughout with arterial supply to the descending and sigmoid colon. There was no obvious extravasation. A 2.4 French Progreat microcatheter and fathom 16 microwire were then used to select the left colic artery middle branch. Dedicated angiogram was performed at this location which demonstrated no evidence of active extravasation in the descending colon. A more distal, descending branch of the left colic artery was selected and repeat angiogram was performed. Again, no evidence of active extravasation was seen at this site. The catheter was redirected to a superior left colic branch and repeat angiogram was performed at this location. Again, no evidence of active extravasation was visualized. Microcatheter was then retracted to the proximal inferior mesenteric artery and repeat angiogram was performed which demonstrated no evidence of active extravasation. The catheters were removed. The 5 French sheath was exchanged for 6 French Angio-Seal device which was deployed successfully without complication. Peripheral pulses were unchanged. A sterile bandage was applied. The patient tolerated the procedure well was transferred back to the ED in good condition. IMPRESSION: Technically successful inferior mesenteric angiogram with selective angiography of the left colic artery branches without evidence of active extravasation. No embolization was performed. Ester Sides, MD  [facility] Radiology Electronically Signed   By: Ester Sides M.D.   On: 05/16/2024 11:17   CT Angio Abd/Pel W and/or Wo Contrast Result Date: 05/15/2024 EXAM: CTA ABDOMEN AND PELVIS WITH AND WITHOUT CONTRAST 05/15/2024 10:47:14 PM TECHNIQUE: CTA images of the abdomen and pelvis without and with intravenous contrast. Three-dimensional MIP/volume rendered formations were performed. Automated exposure control, iterative reconstruction, and/or weight based adjustment of the mA/kV was utilized to reduce the radiation dose to as low as reasonably achievable. COMPARISON: Comparison with abdominal radiograph 05/14/2024 and CT abdomen and pelvis 04/01/2024. CLINICAL HISTORY: Lower GI bleed. FINDINGS: VASCULATURE: AORTA: Minimal scattered aortic calcification. No acute finding. No abdominal aortic aneurysm. No dissection. CELIAC TRUNK: No acute finding. No occlusion or significant stenosis. SUPERIOR MESENTERIC ARTERY: No acute finding. No occlusion or significant stenosis. RENAL ARTERIES: No acute finding. No occlusion or significant stenosis. ILIAC ARTERIES: No acute finding. No occlusion or significant stenosis. LIVER: The liver is unremarkable. GALLBLADDER AND BILE DUCTS: Gallbladder is unremarkable. No biliary ductal dilatation. SPLEEN: The spleen is unremarkable. PANCREAS: The pancreas is unremarkable. ADRENAL GLANDS: Bilateral adrenal glands demonstrate no acute abnormality. KIDNEYS, URETERS AND BLADDER: No stones in the kidneys or ureters. No hydronephrosis. No perinephric or periureteral stranding. Urinary bladder is unremarkable. GI AND BOWEL: Small periumbilical hernia with broad base containing small bowel and colon but without proximal obstruction. Diffusely stool-filled colon. No  small or large bowel distention. Colonic diverticula without evidence of acute diverticulitis. In the mid descending colon, there is a focal area of intraluminal contrast  extravasation on the arterial phase with progressive pooling on the delayed phase. No corresponding density is demonstrated on the unenhanced images. This is consistent with a focal site of acute gastrointestinal hemorrhage. There is a diverticulum adjacent to the area of hemorrhage, suggesting that this is most likely to represent diverticular bleed , less likely due to an occult mass. REPRODUCTIVE: The uterus is surgically absent. PERITONEUM AND RETRPERITONEUM: No ascites or free air. LUNG BASE: Lung bases are clear. LYMPH NODES: No lymphadenopathy. BONES AND SOFT TISSUES: Degenerative changes in the spine. Scarring along the midline consistent with postoperative change. No acute abnormality of the bones. No acute soft tissue abnormality. Critical results/urgent findings were called at 10:56 pm on 05/15/2024 by radiologist W. Okey Gravely, MD to Dr. Jerilynn Later. IMPRESSION: 1. Focal site of acute gastrointestinal hemorrhage in the mid descending colon, with intraluminal contrast extravasation on arterial phase and progressive pooling on delayed phase. Etiology is indeterminate but most likely to represent diverticular bleed. 2. Small periumbilical hernia containing small bowel and colon without proximal obstruction. Electronically signed by: Elsie Gravely MD 05/15/2024 11:02 PM EST RP Workstation: [id number]    Labs:  CBC: Recent Labs    05/15/24 2300 05/16/24 0519 05/16/24 1309 05/16/24 1830 05/17/24 0351 05/17/24 0656 05/18/24 0516 05/18/24 1304  WBC 4.5 5.0  --  8.6  --   --  6.8  --   HGB 7.0* 8.0*   < > 7.9* 9.8* 9.8* 7.4* 8.2*  HCT 23.6* 26.5*   < > 25.1* 30.3* 30.1* 23.7* 25.6*  PLT 234 231  --  202  --   --  171  --    < > = values in this interval not displayed.    COAGS: Recent Labs    05/16/24 0519  INR 1.1    BMP: Recent Labs    05/15/24 0927 05/15/24 2105 05/16/24 0519 05/18/24 0516  NA 139 140 140 140  K 4.1 4.4 4.0 3.6  CL 102 103 106 107  CO2 28 26 27  27   GLUCOSE 100* 119* 111* 101*  BUN 14 16 16  7*  CALCIUM  9.3 9.1 8.2* 8.0*  CREATININE 0.67 0.80 0.59 0.57  GFRNONAA >60 >60 >60 >60    LIVER FUNCTION TESTS: Recent Labs    03/31/24 2338 04/04/24 0212 05/15/24 0927 05/15/24 2105  BILITOT 0.5 0.5 0.5 0.4  AST 24 23 23 24   ALT 18 15 15 14   ALKPHOS 71 48 73 81  PROT 6.5 5.5* 7.1 6.7  ALBUMIN 4.2 3.6 4.4 4.1    Assessment and Plan: Acute lower gastrointestinal hemorrhage s/p provocative angiogram and embolization of distal branches of the left colic artery with cessation of bleeding with Dr Karalee on 05/16/2024-   VSS Hgb 9.8 > 7.4 > 8.2 New mild tenderness to right groin site described as a sharp sensation that is worse with movement from a standing to sitting position which gradually subsides  CT angio 05/16/2024 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

## 2024-05-18 NOTE — Progress Notes (Signed)
 [facility] Gastroenterology Progress Note  Brittany Middleton 73 y.o. Oct 12, 1950   Subjective: No bowel movements or bleeding overnight.  Denies abdominal pain.  Objective: Vital signs: Vitals:   05/18/24 0000 05/18/24 0518  BP: (!) 156/63 (!) 140/55  Pulse: 73 65  Resp: 18 18  Temp: 99 F (37.2 C) 98.2 F (36.8 C)  SpO2: 97% 96%    Physical Exam: Gen: alert, no acute distress, obese, pleasant HEENT: anicteric sclera CV: RRR Chest: CTA B Abd: Soft nontender nondistended positive bowel sounds Ext: no edema  Lab Results: Recent Labs    05/16/24 0519 05/18/24 0516  NA 140 140  K 4.0 3.6  CL 106 107  CO2 27 27  GLUCOSE 111* 101*  BUN 16 7*  CREATININE 0.59 0.57  CALCIUM  8.2* 8.0*   Recent Labs    05/15/24 0927 05/15/24 2105  AST 23 24  ALT 15 14  ALKPHOS 73 81  BILITOT 0.5 0.4  PROT 7.1 6.7  ALBUMIN 4.4 4.1   Recent Labs    05/15/24 0927 05/15/24 2105 05/16/24 1830 05/17/24 0351 05/17/24 0656 05/18/24 0516  WBC 3.5*   < > 8.6  --   --  6.8  NEUTROABS 2.4  --   --   --   --   --   HGB 9.6*   < > 7.9*   < > 9.8* 7.4*  HCT 32.2*   < > 25.1*   < > 30.1* 23.7*  MCV 85.2   < > 85.7  --   --  85.9  PLT 297   < > 202  --   --  171   < > = values in this interval not displayed.      Assessment/Plan: Colonic bleed status post embolization of distal branches of the left colic artery on 11/26 without any signs of ongoing bleeding.  Hemoglobin dropped to 7.4 from 9.8 that I do not think is accurate and agree with recheck of hemoglobin this afternoon.  She has not shown any overt bleeding since embolization.  Continue clear liquid diet.  Slowly advance diet tomorrow if stable.  Dr. Burnette from [facility] will evaluate tomorrow.   Brittany Middleton 05/18/2024, 9:11 AM  Questions please call [phone] ID: Brittany Middleton, female   DOB: [birth year], 73 y.o.   MRN: [id number]

## 2024-05-18 NOTE — Plan of Care (Signed)
  Problem: Education: Goal: Knowledge of General Education information will improve Description: Including pain rating scale, medication(s)/side effects and non-pharmacologic comfort measures Outcome: Progressing   Problem: Activity: Goal: Risk for activity intolerance will decrease Outcome: Progressing   Problem: Pain Managment: Goal: General experience of comfort will improve and/or be controlled Outcome: Progressing

## 2024-05-18 NOTE — Progress Notes (Signed)
   05/18/24 1544  TOC Brief Assessment  Insurance and Status Reviewed  Patient has primary care physician Yes Kristen, Anthony RAMAN, FNP)  Home environment has been reviewed From home  Prior level of function: Independent  Prior/Current Home Services No current home services  Social Drivers of Health Review SDOH reviewed no interventions necessary  Readmission risk has been reviewed Yes  Transition of care needs no transition of care needs at this time

## 2024-05-18 NOTE — Progress Notes (Addendum)
 PROGRESS NOTE    Brittany Middleton  FMW:[id number] DOB: 01/25/1951 DOA: 05/15/2024 PCP: Dyane Anthony RAMAN, FNP     Brief Narrative:  Brittany Middleton is a 73 y.o. female with medical history significant for adenocarcinoma of the right fallopian tube, abdominal carcinomatosis, HTN, HLD, mild intermittent asthma, hepatic steatosis, OSA on CPAP, obesity, anemia, GI bleed and a recent hospitalization for lower GI bleed now presented with rectal bleeding. Patient reports during her hospitalization in October, an EGD and colonoscopy did not find the source of her bleeding. She had follow-up with Dr. Burnette on November 13 and a capsule endoscopy was performed, currently pending results.  She presented again with rectal bleeding, had numerous hematochezia episodes in the emergency department along with lightheadedness, diaphoresis, nausea and vomiting.  In the emergency department, patient was found to have hemoglobin 7.0, positive FOBT, iron  deficient. CTA abdomen and pelvis shows focal site of acute GI hemorrhage in the mid descending colon concerning for diverticular bleed. Interventional radiology and GI were consulted for evaluation.  PCCM also consulted.  Patient underwent IR evaluation, no embolization performed as no active extravasation visualized.  Due to continued significant blood per rectum, repeat CT angio was performed and patient ultimately underwent IR evaluation for embolization 11/26.  New events last 24 hours / Subjective: Feeling much better. 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 No BM since 11/26 PM. Tolerating clear diet.   Assessment & Plan:  Principal Problem:   GI bleed Active Problems:   Chronic asthma   OSA on CPAP   Essential hypertension   Diverticular hemorrhage   Obesity, Class II, BMI 35-39.9   History of ovarian cancer   History of fallopian tube cancer   Lower GI bleed Symptomatic anemia Iron  deficiency  anemia - Appreciate GI, IR - Status post embolization of distal branch of left colic artery by Dr. Karalee 11/26 - Status post total 5 unit packed red blood cell transfusion since admission - Status post IV iron  infusion - PPI - Repeat H&H this afternoon  - Miralax   - Discussed with Dr. Dianna today   Hypertension - Losartan    CAD - Aspirin  and Plavix  currently on hold  Hyperlipidemia - Lipitor, Zetia   OSA - CPAP nightly  History of GYN cancer - Follow-up with Dr. Lonn  DVT prophylaxis:  SCDs Start: 05/16/24 0131  Code Status: Full code Family Communication: None at bedside Disposition Plan: Home Status is: Inpatient Remains inpatient appropriate because: Monitor H&H     Antimicrobials:  Anti-infectives (From admission, onward)    None        Objective: Vitals:   05/17/24 2000 05/17/24 2028 05/18/24 0000 05/18/24 0518  BP: 137/61  (!) 156/63 (!) 140/55  Pulse: 70 73 73 65  Resp:  17 18 18   Temp: 98.6 F (37 C)  99 F (37.2 C) 98.2 F (36.8 C)  TempSrc: Oral  Oral Oral  SpO2: 95% 98% 97% 96%  Weight:      Height:        Intake/Output Summary (Last 24 hours) at 05/18/2024 0928 Last data filed at 05/17/2024 1829 Gross per 24 hour  Intake 1774 ml  Output 1500 ml  Net 274 ml   Filed Weights   05/15/24 2050 05/16/24 0254  Weight: 104.3 kg 108.9 kg    Examination:  General exam: Appears calm and comfortable  Respiratory system: Clear to auscultation. Respiratory effort normal. No respiratory distress. No conversational dyspnea.  Cardiovascular system: S1 & S2 heard, RRR. No murmurs. No pedal edema. Gastrointestinal system: Abdomen is nondistended, soft and nontender. Normal bowel sounds heard. Central nervous system: Alert and oriented. No focal neurological deficits. Speech clear.  Extremities: Symmetric in appearance  Skin: No rashes, lesions or ulcers on exposed skin  Psychiatry: Judgement and insight appear normal. Mood & affect  appropriate.   Data Reviewed: I have personally reviewed following labs and imaging studies  CBC: Recent Labs  Lab 05/15/24 0927 05/15/24 2105 05/15/24 2300 05/16/24 0519 05/16/24 1309 05/16/24 1830 05/17/24 0351 05/17/24 0656 05/18/24 0516  WBC 3.5* 4.5 4.5 5.0  --  8.6  --   --  6.8  NEUTROABS 2.4  --   --   --   --   --   --   --   --   HGB 9.6* 9.0* 7.0* 8.0* 8.7* 7.9* 9.8* 9.8* 7.4*  HCT 32.2* 31.2* 23.6* 26.5* 27.9* 25.1* 30.3* 30.1* 23.7*  MCV 85.2 88.1 88.1 87.2  --  85.7  --   --  85.9  PLT 297 318 234 231  --  202  --   --  171   Basic Metabolic Panel: Recent Labs  Lab 05/15/24 0927 05/15/24 2105 05/16/24 0519 05/18/24 0516  NA 139 140 140 140  K 4.1 4.4 4.0 3.6  CL 102 103 106 107  CO2 28 26 27 27   GLUCOSE 100* 119* 111* 101*  BUN 14 16 16  7*  CREATININE 0.67 0.80 0.59 0.57  CALCIUM  9.3 9.1 8.2* 8.0*   GFR: Estimated Creatinine Clearance: 77.8 mL/min (by C-G formula based on SCr of 0.57 mg/dL). Liver Function Tests: Recent Labs  Lab 05/15/24 0927 05/15/24 2105  AST 23 24  ALT 15 14  ALKPHOS 73 81  BILITOT 0.5 0.4  PROT 7.1 6.7  ALBUMIN 4.4 4.1   No results for input(s): LIPASE, AMYLASE in the last 168 hours. No results for input(s): AMMONIA in the last 168 hours. Coagulation Profile: Recent Labs  Lab 05/16/24 0519  INR 1.1   Cardiac Enzymes: No results for input(s): CKTOTAL, CKMB, CKMBINDEX, TROPONINI in the last 168 hours. BNP (last 3 results) No results for input(s): PROBNP in the last 8760 hours. HbA1C: No results for input(s): HGBA1C in the last 72 hours. CBG: No results for input(s): GLUCAP in the last 168 hours. Lipid Profile: No results for input(s): CHOL, HDL, LDLCALC, TRIG, CHOLHDL, LDLDIRECT in the last 72 hours. Thyroid  Function Tests: No results for input(s): TSH, T4TOTAL, FREET4, T3FREE, THYROIDAB in the last 72 hours.  Anemia Panel: No results for input(s): VITAMINB12,  FOLATE, FERRITIN, TIBC, IRON , RETICCTPCT in the last 72 hours.  Sepsis Labs: No results for input(s): PROCALCITON, LATICACIDVEN in the last 168 hours.  Recent Results (from the past 240 hours)  MRSA Next Gen by PCR, Nasal     Status: None   Collection Time: 05/16/24  4:18 PM   Specimen: Nasal Mucosa; Nasal Swab  Result Value Ref Range Status   MRSA by PCR Next Gen NOT DETECTED NOT DETECTED Final    Comment: (NOTE) The GeneXpert MRSA Assay (FDA approved for NASAL specimens only), is one component of a comprehensive MRSA colonization surveillance program. It is not intended to diagnose MRSA infection nor to guide or monitor treatment for MRSA infections. Test performance is not FDA approved in patients less than 3 years old. Performed at [facility], [address], [place], [place] [address]       Radiology  Studies: IR Angiogram Visceral Selective Result Date: 05/17/2024 INDICATION: 73 year old female with ongoing acute lower GI bleed. CT arteriography was positive late yesterday and patient underwent angiography earlier this morning which was negative for evidence of active bleeding. Patient then did well throughout much of the day before developing recurrent bright red blood per rectum and need for additional transfusion this afternoon. CT arteriography was repeated and confirmed recurrent bleeding in the mid descending colon. Therefore, patient presents for repeat arteriography and embolization. EXAM: SELECTIVE VISCERAL ARTERIOGRAPHY; ADDITIONAL ARTERIOGRAPHY; IR EMBO ART VEN HEMORR LYMPH EXTRAV INC GUIDE ROADMAPPING; IR ULTRASOUND GUIDANCE VASC ACCESS LEFT MEDICATIONS: 5000 units of heparin  was administered intravenously by the Radiology nurse. 4 mg tPA and 200 mcg nitroglycerin  were administered intra-arterially by me. ANESTHESIA/SEDATION: Moderate (conscious) sedation was employed during this procedure. A total of Versed  2 mg and Fentanyl  200 mcg was  administered intravenously by the Radiology nurse. Moderate Sedation Time: 98 minutes. The patient's level of consciousness and vital signs were monitored continuously by radiology nursing throughout the procedure under my direct supervision. CONTRAST:  50mL OMNIPAQUE  IOHEXOL  300 MG/ML SOLN, 40mL OMNIPAQUE  IOHEXOL  300 MG/ML SOLN, 50mL OMNIPAQUE  IOHEXOL  300 MG/ML SOLN FLUOROSCOPY: Radiation Exposure Index (as provided by the fluoroscopic device): 2684 mGy Kerma COMPLICATIONS: SIR Level A - No therapy, no consequence. PROCEDURE: Informed consent was obtained from the patient following explanation of the procedure, risks, benefits and alternatives. The patient understands, agrees and consents for the procedure. All questions were addressed. A time out was performed prior to the initiation of the procedure. Maximal barrier sterile technique utilized including caps, mask, sterile gowns, sterile gloves, large sterile drape, hand hygiene, and Betadine prep. The left common femoral artery was interrogated with ultrasound and found to be widely patent. An image was obtained and stored for the medical record. Local anesthesia was attained by infiltration with 1% lidocaine . A small dermatotomy was made. Under real-time sonographic guidance, the vessel was punctured with a 21 gauge micropuncture needle. Using standard technique, the initial micro needle was exchanged over a 0.018 micro wire for a transitional 4 French micro sheath. The micro sheath was then exchanged over a 0.035 wire for a 5 French vascular sheath. Unfortunately, the department is out of RIM catheters. Therefore a Sos Omni selective catheter was advanced over a Bentson wire into the abdominal aorta and formed. The catheter was used to select the origin of the IMA and arteriography was performed. No active bleeding on the initial angiogram. Given the clearly defined location of bleeding in the mid descending artery, a Cook cantata 2.5 French microcatheter was  therefore used to select the left colic artery and arteriography was again performed. Again, no active bleeding. The microcatheter was further advanced into the descending branch of the left colic artery and arteriography was performed. No evidence of active bleeding. The microcatheter was pulled back and advanced into the ascending branch of the left colic artery and arteriography was performed. No evidence of active bleeding. Anatomic leak, the region of colon most corresponding to the recent visualized bleed on CT arteriography is supplied by the descending branch of the left colic artery. Therefore, the microcatheter was again advanced into the descending branch of the left colic artery and advanced distally just proximal to its bifurcation into the many Vasa recta branches. At this time, heparin  was administered intravenously. TPA was prepared and 4 mg of tPA was administered through the catheter followed by a total of 200 mcg of nitroglycerin . After several minutes additional arteriography was  performed. This time, active bleeding is visualized from 1 of the tortuous distal branches. The microcatheter was used to select several of the small branches in till the active bleeding was successfully visualized. At this time, coil embolization was performed using a series of detachable penumbra microcoils. Follow-up arteriography demonstrates no further active bleeding. The microcatheter was removed. Contrast was injected through the base catheter at the origin of the IMA. Unfortunately, this resulted in a small iatrogenic dissection of the proximal IMA. The catheter was moved and repeat injection performed gingerly. The dissection is not flow limiting. This is likely of no consequence and should heal without any further intervention. IMPRESSION: 1. Successful provocative mesenteric angiogram with recreation and visualization of the site of bleeding. 2. Successful coil embolization of distal branches of the  descending branch of the left colic artery with cessation of bleeding. 3. 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 This is likely of no consequence and should heal without any further intervention. Electronically Signed   By: Wilkie Lent M.D.   On: 05/17/2024 07:29   IR US  Guide Vasc Access Left Result Date: 05/17/2024 INDICATION: 73 year old female with ongoing acute lower GI bleed. CT arteriography was positive late yesterday and patient underwent angiography earlier this morning which was negative for evidence of active bleeding. Patient then did well throughout much of the day before developing recurrent bright red blood per rectum and need for additional transfusion this afternoon. CT arteriography was repeated and confirmed recurrent bleeding in the mid descending colon. Therefore, patient presents for repeat arteriography and embolization. EXAM: SELECTIVE VISCERAL ARTERIOGRAPHY; ADDITIONAL ARTERIOGRAPHY; IR EMBO ART VEN HEMORR LYMPH EXTRAV INC GUIDE ROADMAPPING; IR ULTRASOUND GUIDANCE VASC ACCESS LEFT MEDICATIONS: 5000 units of heparin  was administered intravenously by the Radiology nurse. 4 mg tPA and 200 mcg nitroglycerin  were administered intra-arterially by me. ANESTHESIA/SEDATION: Moderate (conscious) sedation was employed during this procedure. A total of Versed  2 mg and Fentanyl  200 mcg was administered intravenously by the Radiology nurse. Moderate Sedation Time: 98 minutes. The patient's level of consciousness and vital signs were monitored continuously by radiology nursing throughout the procedure under my direct supervision. CONTRAST:  50mL OMNIPAQUE  IOHEXOL  300 MG/ML SOLN, 40mL OMNIPAQUE  IOHEXOL  300 MG/ML SOLN, 50mL OMNIPAQUE  IOHEXOL  300 MG/ML SOLN FLUOROSCOPY: Radiation Exposure Index (as provided by the fluoroscopic device): 2684 mGy Kerma COMPLICATIONS: SIR Level A - No therapy, no consequence. PROCEDURE: Informed consent was obtained from the patient  following explanation of the procedure, risks, benefits and alternatives. The patient understands, agrees and consents for the procedure. All questions were addressed. A time out was performed prior to the initiation of the procedure. Maximal barrier sterile technique utilized including caps, mask, sterile gowns, sterile gloves, large sterile drape, hand hygiene, and Betadine prep. The left common femoral artery was interrogated with ultrasound and found to be widely patent. An image was obtained and stored for the medical record. Local anesthesia was attained by infiltration with 1% lidocaine . A small dermatotomy was made. Under real-time sonographic guidance, the vessel was punctured with a 21 gauge micropuncture needle. Using standard technique, the initial micro needle was exchanged over a 0.018 micro wire for a transitional 4 French micro sheath. The micro sheath was then exchanged over a 0.035 wire for a 5 French vascular sheath. Unfortunately, the department is out of RIM catheters. Therefore a Sos Omni selective catheter was advanced over a Bentson wire into the abdominal aorta and formed. The catheter was used to select the origin of the  IMA and arteriography was performed. No active bleeding on the initial angiogram. Given the clearly defined location of bleeding in the mid descending artery, a Cook cantata 2.5 French microcatheter was therefore used to select the left colic artery and arteriography was again performed. Again, no active bleeding. The microcatheter was further advanced into the descending branch of the left colic artery and arteriography was performed. No evidence of active bleeding. The microcatheter was pulled back and advanced into the ascending branch of the left colic artery and arteriography was performed. No evidence of active bleeding. Anatomic leak, the region of colon most corresponding to the recent visualized bleed on CT arteriography is supplied by the descending branch of the  left colic artery. Therefore, the microcatheter was again advanced into the descending branch of the left colic artery and advanced distally just proximal to its bifurcation into the many Vasa recta branches. At this time, heparin  was administered intravenously. TPA was prepared and 4 mg of tPA was administered through the catheter followed by a total of 200 mcg of nitroglycerin . After several minutes additional arteriography was performed. This time, active bleeding is visualized from 1 of the tortuous distal branches. The microcatheter was used to select several of the small branches in till the active bleeding was successfully visualized. At this time, coil embolization was performed using a series of detachable penumbra microcoils. Follow-up arteriography demonstrates no further active bleeding. The microcatheter was removed. Contrast was injected through the base catheter at the origin of the IMA. Unfortunately, this resulted in a small iatrogenic dissection of the proximal IMA. The catheter was moved and repeat injection performed gingerly. The dissection is not flow limiting. This is likely of no consequence and should heal without any further intervention. IMPRESSION: 1. Successful provocative mesenteric angiogram with recreation and visualization of the site of bleeding. 2. Successful coil embolization of distal branches of the descending branch of the left colic artery with cessation of bleeding. 3. 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 This is likely of no consequence and should heal without any further intervention. Electronically Signed   By: Wilkie Lent M.D.   On: 05/17/2024 07:29   IR EMBO ART  VEN HEMORR LYMPH EXTRAV  INC GUIDE ROADMAPPING Result Date: 05/17/2024 INDICATION: 73 year old female with ongoing acute lower GI bleed. CT arteriography was positive late yesterday and patient underwent angiography earlier this morning which was negative for evidence of  active bleeding. Patient then did well throughout much of the day before developing recurrent bright red blood per rectum and need for additional transfusion this afternoon. CT arteriography was repeated and confirmed recurrent bleeding in the mid descending colon. Therefore, patient presents for repeat arteriography and embolization. EXAM: SELECTIVE VISCERAL ARTERIOGRAPHY; ADDITIONAL ARTERIOGRAPHY; IR EMBO ART VEN HEMORR LYMPH EXTRAV INC GUIDE ROADMAPPING; IR ULTRASOUND GUIDANCE VASC ACCESS LEFT MEDICATIONS: 5000 units of heparin  was administered intravenously by the Radiology nurse. 4 mg tPA and 200 mcg nitroglycerin  were administered intra-arterially by me. ANESTHESIA/SEDATION: Moderate (conscious) sedation was employed during this procedure. A total of Versed  2 mg and Fentanyl  200 mcg was administered intravenously by the Radiology nurse. Moderate Sedation Time: 98 minutes. The patient's level of consciousness and vital signs were monitored continuously by radiology nursing throughout the procedure under my direct supervision. CONTRAST:  50mL OMNIPAQUE  IOHEXOL  300 MG/ML SOLN, 40mL OMNIPAQUE  IOHEXOL  300 MG/ML SOLN, 50mL OMNIPAQUE  IOHEXOL  300 MG/ML SOLN FLUOROSCOPY: Radiation Exposure Index (as provided by the fluoroscopic device): 2684 mGy Kerma COMPLICATIONS: SIR Level A -  No therapy, no consequence. PROCEDURE: Informed consent was obtained from the patient following explanation of the procedure, risks, benefits and alternatives. The patient understands, agrees and consents for the procedure. All questions were addressed. A time out was performed prior to the initiation of the procedure. Maximal barrier sterile technique utilized including caps, mask, sterile gowns, sterile gloves, large sterile drape, hand hygiene, and Betadine prep. The left common femoral artery was interrogated with ultrasound and found to be widely patent. An image was obtained and stored for the medical record. Local anesthesia was  attained by infiltration with 1% lidocaine . A small dermatotomy was made. Under real-time sonographic guidance, the vessel was punctured with a 21 gauge micropuncture needle. Using standard technique, the initial micro needle was exchanged over a 0.018 micro wire for a transitional 4 French micro sheath. The micro sheath was then exchanged over a 0.035 wire for a 5 French vascular sheath. Unfortunately, the department is out of RIM catheters. Therefore a Sos Omni selective catheter was advanced over a Bentson wire into the abdominal aorta and formed. The catheter was used to select the origin of the IMA and arteriography was performed. No active bleeding on the initial angiogram. Given the clearly defined location of bleeding in the mid descending artery, a Cook cantata 2.5 French microcatheter was therefore used to select the left colic artery and arteriography was again performed. Again, no active bleeding. The microcatheter was further advanced into the descending branch of the left colic artery and arteriography was performed. No evidence of active bleeding. The microcatheter was pulled back and advanced into the ascending branch of the left colic artery and arteriography was performed. No evidence of active bleeding. Anatomic leak, the region of colon most corresponding to the recent visualized bleed on CT arteriography is supplied by the descending branch of the left colic artery. Therefore, the microcatheter was again advanced into the descending branch of the left colic artery and advanced distally just proximal to its bifurcation into the many Vasa recta branches. At this time, heparin  was administered intravenously. TPA was prepared and 4 mg of tPA was administered through the catheter followed by a total of 200 mcg of nitroglycerin . After several minutes additional arteriography was performed. This time, active bleeding is visualized from 1 of the tortuous distal branches. The microcatheter was used to  select several of the small branches in till the active bleeding was successfully visualized. At this time, coil embolization was performed using a series of detachable penumbra microcoils. Follow-up arteriography demonstrates no further active bleeding. The microcatheter was removed. Contrast was injected through the base catheter at the origin of the IMA. Unfortunately, this resulted in a small iatrogenic dissection of the proximal IMA. The catheter was moved and repeat injection performed gingerly. The dissection is not flow limiting. This is likely of no consequence and should heal without any further intervention. IMPRESSION: 1. Successful provocative mesenteric angiogram with recreation and visualization of the site of bleeding. 2. Successful coil embolization of distal branches of the descending branch of the left colic artery with cessation of bleeding. 3. 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 This is likely of no consequence and should heal without any further intervention. Electronically Signed   By: Wilkie Lent M.D.   On: 05/17/2024 07:29   IR Angiogram Selective Each Additional Vessel Result Date: 05/17/2024 INDICATION: 73 year old female with ongoing acute lower GI bleed. CT arteriography was positive late yesterday and patient underwent angiography earlier this morning which was  negative for evidence of active bleeding. Patient then did well throughout much of the day before developing recurrent bright red blood per rectum and need for additional transfusion this afternoon. CT arteriography was repeated and confirmed recurrent bleeding in the mid descending colon. Therefore, patient presents for repeat arteriography and embolization. EXAM: SELECTIVE VISCERAL ARTERIOGRAPHY; ADDITIONAL ARTERIOGRAPHY; IR EMBO ART VEN HEMORR LYMPH EXTRAV INC GUIDE ROADMAPPING; IR ULTRASOUND GUIDANCE VASC ACCESS LEFT MEDICATIONS: 5000 units of heparin  was administered intravenously by  the Radiology nurse. 4 mg tPA and 200 mcg nitroglycerin  were administered intra-arterially by me. ANESTHESIA/SEDATION: Moderate (conscious) sedation was employed during this procedure. A total of Versed  2 mg and Fentanyl  200 mcg was administered intravenously by the Radiology nurse. Moderate Sedation Time: 98 minutes. The patient's level of consciousness and vital signs were monitored continuously by radiology nursing throughout the procedure under my direct supervision. CONTRAST:  50mL OMNIPAQUE  IOHEXOL  300 MG/ML SOLN, 40mL OMNIPAQUE  IOHEXOL  300 MG/ML SOLN, 50mL OMNIPAQUE  IOHEXOL  300 MG/ML SOLN FLUOROSCOPY: Radiation Exposure Index (as provided by the fluoroscopic device): 2684 mGy Kerma COMPLICATIONS: SIR Level A - No therapy, no consequence. PROCEDURE: Informed consent was obtained from the patient following explanation of the procedure, risks, benefits and alternatives. The patient understands, agrees and consents for the procedure. All questions were addressed. A time out was performed prior to the initiation of the procedure. Maximal barrier sterile technique utilized including caps, mask, sterile gowns, sterile gloves, large sterile drape, hand hygiene, and Betadine prep. The left common femoral artery was interrogated with ultrasound and found to be widely patent. An image was obtained and stored for the medical record. Local anesthesia was attained by infiltration with 1% lidocaine . A small dermatotomy was made. Under real-time sonographic guidance, the vessel was punctured with a 21 gauge micropuncture needle. Using standard technique, the initial micro needle was exchanged over a 0.018 micro wire for a transitional 4 French micro sheath. The micro sheath was then exchanged over a 0.035 wire for a 5 French vascular sheath. Unfortunately, the department is out of RIM catheters. Therefore a Sos Omni selective catheter was advanced over a Bentson wire into the abdominal aorta and formed. The catheter was used  to select the origin of the IMA and arteriography was performed. No active bleeding on the initial angiogram. Given the clearly defined location of bleeding in the mid descending artery, a Cook cantata 2.5 French microcatheter was therefore used to select the left colic artery and arteriography was again performed. Again, no active bleeding. The microcatheter was further advanced into the descending branch of the left colic artery and arteriography was performed. No evidence of active bleeding. The microcatheter was pulled back and advanced into the ascending branch of the left colic artery and arteriography was performed. No evidence of active bleeding. Anatomic leak, the region of colon most corresponding to the recent visualized bleed on CT arteriography is supplied by the descending branch of the left colic artery. Therefore, the microcatheter was again advanced into the descending branch of the left colic artery and advanced distally just proximal to its bifurcation into the many Vasa recta branches. At this time, heparin  was administered intravenously. TPA was prepared and 4 mg of tPA was administered through the catheter followed by a total of 200 mcg of nitroglycerin . After several minutes additional arteriography was performed. This time, active bleeding is visualized from 1 of the tortuous distal branches. The microcatheter was used to select several of the small branches in till the active bleeding was successfully  visualized. At this time, coil embolization was performed using a series of detachable penumbra microcoils. Follow-up arteriography demonstrates no further active bleeding. The microcatheter was removed. Contrast was injected through the base catheter at the origin of the IMA. Unfortunately, this resulted in a small iatrogenic dissection of the proximal IMA. The catheter was moved and repeat injection performed gingerly. The dissection is not flow limiting. This is likely of no consequence and  should heal without any further intervention. IMPRESSION: 1. Successful provocative mesenteric angiogram with recreation and visualization of the site of bleeding. 2. Successful coil embolization of distal branches of the descending branch of the left colic artery with cessation of bleeding. 3. 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 This is likely of no consequence and should heal without any further intervention. Electronically Signed   By: Wilkie Lent M.D.   On: 05/17/2024 07:29   IR Angiogram Selective Each Additional Vessel Result Date: 05/17/2024 INDICATION: 72 year old female with ongoing acute lower GI bleed. CT arteriography was positive late yesterday and patient underwent angiography earlier this morning which was negative for evidence of active bleeding. Patient then did well throughout much of the day before developing recurrent bright red blood per rectum and need for additional transfusion this afternoon. CT arteriography was repeated and confirmed recurrent bleeding in the mid descending colon. Therefore, patient presents for repeat arteriography and embolization. EXAM: SELECTIVE VISCERAL ARTERIOGRAPHY; ADDITIONAL ARTERIOGRAPHY; IR EMBO ART VEN HEMORR LYMPH EXTRAV INC GUIDE ROADMAPPING; IR ULTRASOUND GUIDANCE VASC ACCESS LEFT MEDICATIONS: 5000 units of heparin  was administered intravenously by the Radiology nurse. 4 mg tPA and 200 mcg nitroglycerin  were administered intra-arterially by me. ANESTHESIA/SEDATION: Moderate (conscious) sedation was employed during this procedure. A total of Versed  2 mg and Fentanyl  200 mcg was administered intravenously by the Radiology nurse. Moderate Sedation Time: 98 minutes. The patient's level of consciousness and vital signs were monitored continuously by radiology nursing throughout the procedure under my direct supervision. CONTRAST:  50mL OMNIPAQUE  IOHEXOL  300 MG/ML SOLN, 40mL OMNIPAQUE  IOHEXOL  300 MG/ML SOLN, 50mL OMNIPAQUE   IOHEXOL  300 MG/ML SOLN FLUOROSCOPY: Radiation Exposure Index (as provided by the fluoroscopic device): 2684 mGy Kerma COMPLICATIONS: SIR Level A - No therapy, no consequence. PROCEDURE: Informed consent was obtained from the patient following explanation of the procedure, risks, benefits and alternatives. The patient understands, agrees and consents for the procedure. All questions were addressed. A time out was performed prior to the initiation of the procedure. Maximal barrier sterile technique utilized including caps, mask, sterile gowns, sterile gloves, large sterile drape, hand hygiene, and Betadine prep. The left common femoral artery was interrogated with ultrasound and found to be widely patent. An image was obtained and stored for the medical record. Local anesthesia was attained by infiltration with 1% lidocaine . A small dermatotomy was made. Under real-time sonographic guidance, the vessel was punctured with a 21 gauge micropuncture needle. Using standard technique, the initial micro needle was exchanged over a 0.018 micro wire for a transitional 4 French micro sheath. The micro sheath was then exchanged over a 0.035 wire for a 5 French vascular sheath. Unfortunately, the department is out of RIM catheters. Therefore a Sos Omni selective catheter was advanced over a Bentson wire into the abdominal aorta and formed. The catheter was used to select the origin of the IMA and arteriography was performed. No active bleeding on the initial angiogram. Given the clearly defined location of bleeding in the mid descending artery, a Cook cantata 2.5 French microcatheter was therefore  used to select the left colic artery and arteriography was again performed. Again, no active bleeding. The microcatheter was further advanced into the descending branch of the left colic artery and arteriography was performed. No evidence of active bleeding. The microcatheter was pulled back and advanced into the ascending branch of the  left colic artery and arteriography was performed. No evidence of active bleeding. Anatomic leak, the region of colon most corresponding to the recent visualized bleed on CT arteriography is supplied by the descending branch of the left colic artery. Therefore, the microcatheter was again advanced into the descending branch of the left colic artery and advanced distally just proximal to its bifurcation into the many Vasa recta branches. At this time, heparin  was administered intravenously. TPA was prepared and 4 mg of tPA was administered through the catheter followed by a total of 200 mcg of nitroglycerin . After several minutes additional arteriography was performed. This time, active bleeding is visualized from 1 of the tortuous distal branches. The microcatheter was used to select several of the small branches in till the active bleeding was successfully visualized. At this time, coil embolization was performed using a series of detachable penumbra microcoils. Follow-up arteriography demonstrates no further active bleeding. The microcatheter was removed. Contrast was injected through the base catheter at the origin of the IMA. Unfortunately, this resulted in a small iatrogenic dissection of the proximal IMA. The catheter was moved and repeat injection performed gingerly. The dissection is not flow limiting. This is likely of no consequence and should heal without any further intervention. IMPRESSION: 1. Successful provocative mesenteric angiogram with recreation and visualization of the site of bleeding. 2. Successful coil embolization of distal branches of the descending branch of the left colic artery with cessation of bleeding. 3. 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 This is likely of no consequence and should heal without any further intervention. Electronically Signed   By: Wilkie Lent M.D.   On: 05/17/2024 07:29   CT ANGIO GI BLEED Result Date: 05/16/2024 CLINICAL  DATA:  Active rectal bleeding EXAM: CTA ABDOMEN AND PELVIS WITHOUT AND WITH CONTRAST TECHNIQUE: Multidetector CT imaging of the abdomen and pelvis was performed using the standard protocol during bolus administration of intravenous contrast. Multiplanar reconstructed images and MIPs were obtained and reviewed to evaluate the vascular anatomy. RADIATION DOSE REDUCTION: This exam was performed according to the departmental dose-optimization program which includes automated exposure control, adjustment of the mA and/or kV according to patient size and/or use of iterative reconstruction technique. CONTRAST:  OMNIPAQUE  IOHEXOL  350 MG/ML SOLN COMPARISON:  05/16/2024, 05/15/2024 FINDINGS: VASCULAR Aorta: Normal caliber aorta without aneurysm, dissection, vasculitis or significant stenosis. Minimal atherosclerosis. Celiac: Patent without evidence of aneurysm, dissection, vasculitis or significant stenosis. SMA: Patent without evidence of aneurysm, dissection, vasculitis or significant stenosis. Renals: Both renal arteries are patent without evidence of aneurysm, dissection, vasculitis, fibromuscular dysplasia or significant stenosis. Mild atherosclerosis of the left renal artery. IMA: Patent without evidence of aneurysm, dissection, vasculitis or significant stenosis. Inflow: Patent without evidence of aneurysm, dissection, vasculitis or significant stenosis. Mild atherosclerosis. Proximal Outflow: Bilateral common femoral and visualized portions of the superficial and profunda femoral arteries are patent without evidence of aneurysm, dissection, vasculitis or significant stenosis. Postprocedural changes are seen within the right common femoral artery related to recent visceral arteriogram. There is a 0.8 x 0.7 x 1.1 cm pseudoaneurysm off the ventral margin of the common femoral artery, with a patent narrow neck measuring 0.3 cm supplying  the pseudoaneurysm. Mild surrounding subcutaneous fat stranding. Veins: No  obvious venous abnormality within the limitations of this arterial phase study. Review of the MIP images confirms the above findings. NON-VASCULAR Lower chest: No acute pleural or parenchymal lung disease. Hepatobiliary: Gallbladder sludge versus vicarious excretion of previously administered contrast. No cholecystitis. The liver is unremarkable. Pancreas: Unremarkable. No pancreatic ductal dilatation or surrounding inflammatory changes. Spleen: Normal in size without focal abnormality. Adrenals/Urinary Tract: Adrenal glands are unremarkable. Kidneys are normal, without renal calculi, focal lesion, or hydronephrosis. Nonspecific gas in the bladder lumen, please correlate with any recent instrumentation or catheterization. Stomach/Bowel: There is intraluminal accumulation of contrast along the mesenteric wall of the mid descending colon, reference axial images 95 through 108 of series 6. This is in a similar distribution to where the active hemorrhage was reported on prior CTA, and is more subtle on this exam than prior. No other areas of active gastrointestinal hemorrhage are identified. No bowel obstruction or ileus. Scattered gas fluid levels throughout the colon may reflect diarrhea. Small hiatal hernia. Lymphatic: No pathologic adenopathy. Reproductive: Status post hysterectomy. No adnexal masses. Other: No free fluid or free intraperitoneal gas. 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 No incarceration or obstruction. Musculoskeletal: No acute or destructive bony abnormalities. Reconstructed images demonstrate no additional findings. IMPRESSION: VASCULAR 1. Active gastrointestinal hemorrhage along the mesenteric wall of the mid descending colon, in a similar location where the active hemorrhage with seen on previous CT angiography procedure. Visceral angiogram performed earlier today did not demonstrate active hemorrhage in this region however. 2. 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 3.  Aortic Atherosclerosis (ICD10-I70.0). NON-VASCULAR 1. Colonic gas fluid levels consistent with diarrhea. 2. Stable midline ventral hernia. 3. Gallbladder sludge versus vicarious excretion of contrast. No evidence of acute cholecystitis. Critical Value/emergent results were called by telephone at the time of interpretation on 05/16/2024 at 6:52 pm to provider [facility] , who verbally acknowledged these results. Electronically Signed   By: Ozell Daring M.D.   On: 05/16/2024 18:59      Scheduled Meds:  atorvastatin   40 mg Oral Daily   Chlorhexidine  Gluconate Cloth  6 each Topical Daily   ezetimibe   10 mg Oral Daily   losartan   50 mg Oral Daily   pantoprazole  (PROTONIX ) IV  40 mg Intravenous Q12H   polyethylene glycol  17 g Oral Daily   Continuous Infusions:   LOS: 2 days   Time spent: 25 minutes   Delon Hoe, DO [facility] 05/18/2024, 9:28 AM   Available via Epic secure chat 7am-7pm After these hours, please refer to coverage provider listed on [email or web address]

## 2024-05-18 NOTE — Evaluation (Signed)
 Occupational Therapy Evaluation Patient Details Name: Brittany Middleton MRN: [id number] DOB: 1951-03-08 Today's Date: 05/18/2024   History of Present Illness   73 yr old female who presented to therapy following hospital admission on 05/15/2024 due to rectal bleeding. She was found to have a GI bleed and symptomatic anemia. PMH: abdominal carcinomatosis, anemia, arthritis, coronary aneurysm, asthma, angina, HTN, GERD, GI bleed, HLD, hepatic steatosis, OSA on CPAP, R fallopian tube CA     Clinical Impressions The pt is currently presenting slightly below her baseline level of functioning for self-care management. During the session today, she required SBA for tasks, including sit to stand, doffing and donning her socks seated in the chair, and for hand washing standing at the sink. She reported feelings of slight generalized weakness, mild lightheadedness, and fatigue with activity. She will benefit from further OT services to maximize her independence with ADLs and to decrease the risk for restricted participation in meaningful activities. Anticipate no post-acute care therapy needs.      If plan is discharge home, recommend the following:   Other (comment) (N/A)     Functional Status Assessment   Patient has had a recent decline in their functional status and demonstrates the ability to make significant improvements in function in a reasonable and predictable amount of time.     Equipment Recommendations   None recommended by OT      Precautions/Restrictions   Restrictions Weight Bearing Restrictions Per Provider Order: No     Mobility Bed Mobility      General bed mobility comments: pt was received in the bedside chair    Transfers Overall transfer level: Needs assistance Equipment used: None Transfers: Sit to/from Stand Sit to Stand: Supervision                  Balance Overall balance assessment: Mild deficits observed, not formally tested          ADL either performed or assessed with clinical judgement   ADL Overall ADL's : Needs assistance/impaired Eating/Feeding: Independent;Sitting   Grooming: Set up;Standing   Upper Body Bathing: Set up;Sitting   Lower Body Bathing: Set up;Supervison/ safety;Sit to/from stand   Upper Body Dressing : Set up;Sitting   Lower Body Dressing: Set up;Sitting/lateral leans   Toilet Transfer: Supervision/safety;Ambulation Toilet Transfer Details (indicate cue type and reason): Pt ambulated to and from the bathroom in her room without an assistive device. Toileting- Clothing Manipulation and Hygiene: Set up;Sit to/from stand Toileting - Clothing Manipulation Details (indicate cue type and reason): at bathroom level, based on clinical judgement              Pertinent Vitals/Pain Pain Assessment Pain Assessment: No/denies pain     Extremity/Trunk Assessment Upper Extremity Assessment Upper Extremity Assessment: Overall WFL for tasks assessed;Right hand dominant;RUE deficits/detail;LUE deficits/detail RUE Deficits / Details: BUE AROM and strength WFL LUE Deficits / Details: BUE AROM and strength WFL   Lower Extremity Assessment Lower Extremity Assessment: Overall WFL for tasks assessed      Communication Communication Communication: No apparent difficulties   Cognition Arousal: Alert Behavior During Therapy: WFL for tasks assessed/performed               OT - Cognition Comments: Oriented x4          Following commands: Intact       Cueing  General Comments   Cueing Techniques: Verbal cues              Home Living Family/patient  expects to be discharged to:: Private residence Living Arrangements: Parent (20 yr old mother) Available Help at Discharge: Family Type of Home: House Home Access: Level entry     Home Layout: One level     Bathroom Shower/Tub: Producer, Television/film/video: Handicapped height     Home Equipment: Shower seat - built  Charity Fundraiser (2 wheels);Rollator (4 wheels);BSC/3in1;Wheelchair - manual (equipment belongs to the pt's mother)          Prior Functioning/Environment Prior Level of Function : Independent/Modified Independent;Driving             Mobility Comments:  (Independent with ambulation.) ADLs Comments:  (Independent with ADLs, cooking, cleaning, driving, and being caregiver to her mother.)    OT Problem List: Decreased strength;Decreased activity tolerance   OT Treatment/Interventions: Self-care/ADL training;Therapeutic exercise;Energy conservation;DME and/or AE instruction;Therapeutic activities;Balance training;Patient/family education      OT Goals(Current goals can be found in the care plan section)   Acute Rehab OT Goals Patient Stated Goal: to get information on caregiver resources OT Goal Formulation: With patient Time For Goal Achievement: 06/01/24 Potential to Achieve Goals: Good ADL Goals Pt Will Perform Grooming: Independently;standing Pt Will Perform Lower Body Dressing: Independently;sitting/lateral leans;sit to/from stand Pt Will Transfer to Toilet: Independently;ambulating Pt Will Perform Toileting - Clothing Manipulation and hygiene: Independently;sit to/from stand   OT Frequency:  Min 1X/week       AM-PAC OT 6 Clicks Daily Activity     Outcome Measure Help from another person eating meals?: None Help from another person taking care of personal grooming?: A Little Help from another person toileting, which includes using toliet, bedpan, or urinal?: A Little Help from another person bathing (including washing, rinsing, drying)?: A Little Help from another person to put on and taking off regular upper body clothing?: None Help from another person to put on and taking off regular lower body clothing?: A Little 6 Click Score: 20   End of Session Equipment Utilized During Treatment: Other (comment) (N/A) Nurse Communication: Mobility status  Activity  Tolerance: Patient tolerated treatment well Patient left: in chair;with family/visitor present;with call bell/phone within reach  OT Visit Diagnosis: Muscle weakness (generalized) (M62.81)                Time: 8650-8566 OT Time Calculation (min): 44 min Charges:  OT General Charges $OT Visit: 1 Visit OT Evaluation $OT Eval Low Complexity: 1 Low OT Treatments $Self Care/Home Management : 8-22 mins    Delanna JINNY Lesches, OTR/L 05/18/2024, 4:35 PM

## 2024-05-18 NOTE — Progress Notes (Signed)
   05/18/24 2058  BiPAP/CPAP/SIPAP  BiPAP/CPAP/SIPAP Pt Type Adult  Mask Type Nasal pillows  Dentures removed? Not applicable  FiO2 (%) 21 %  Heater Temperature  (Declines Humidification)  Patient Home Machine Yes  Safety Check Completed by RT for Home Unit Yes, no issues noted  Patient Home Mask Yes  Patient Home Tubing Yes  Auto Titrate Yes  Minimum cmH2O 5 cmH2O  Maximum cmH2O 10 cmH2O  Device Plugged into RED Power Outlet Yes  BiPAP/CPAP /SiPAP Vitals  Pulse Rate 71  Resp 17  SpO2 98 %  Bilateral Breath Sounds Clear  MEWS Score/Color  MEWS Score 0  MEWS Score Color Landy

## 2024-05-19 ENCOUNTER — Inpatient Hospital Stay (HOSPITAL_COMMUNITY)

## 2024-05-19 DIAGNOSIS — K922 Gastrointestinal hemorrhage, unspecified: Secondary | ICD-10-CM | POA: Diagnosis not present

## 2024-05-19 DIAGNOSIS — I724 Aneurysm of artery of lower extremity: Secondary | ICD-10-CM

## 2024-05-19 LAB — BPAM RBC
Blood Product Expiration Date: 202512062359
Blood Product Expiration Date: 202512062359
Blood Product Expiration Date: 202512172359
Blood Product Expiration Date: 202512192359
Blood Product Expiration Date: 202601012359
Blood Product Expiration Date: 202601012359
Blood Product Expiration Date: 202601012359
ISSUE DATE / TIME: 202511260037
ISSUE DATE / TIME: 202511260816
ISSUE DATE / TIME: 202511261531
ISSUE DATE / TIME: 202511261832
ISSUE DATE / TIME: 202511262314
Unit Type and Rh: 1700
Unit Type and Rh: 1700
Unit Type and Rh: 1700
Unit Type and Rh: 1700
Unit Type and Rh: 1700
Unit Type and Rh: 1700
Unit Type and Rh: 1700

## 2024-05-19 LAB — TYPE AND SCREEN
ABO/RH(D): B NEG
Antibody Screen: NEGATIVE
Unit division: 0
Unit division: 0
Unit division: 0
Unit division: 0
Unit division: 0
Unit division: 0
Unit division: 0

## 2024-05-19 LAB — CBC
HCT: 24.4 % — ABNORMAL LOW (ref 36.0–46.0)
Hemoglobin: 7.6 g/dL — ABNORMAL LOW (ref 12.0–15.0)
MCH: 27.6 pg (ref 26.0–34.0)
MCHC: 31.1 g/dL (ref 30.0–36.0)
MCV: 88.7 fL (ref 80.0–100.0)
Platelets: 189 K/uL (ref 150–400)
RBC: 2.75 MIL/uL — ABNORMAL LOW (ref 3.87–5.11)
RDW: 20.1 % — ABNORMAL HIGH (ref 11.5–15.5)
WBC: 5.4 K/uL (ref 4.0–10.5)
nRBC: 0.4 % — ABNORMAL HIGH (ref 0.0–0.2)

## 2024-05-19 MED ORDER — ASPIRIN 81 MG PO TBEC
81.0000 mg | DELAYED_RELEASE_TABLET | Freq: Every day | ORAL | Status: DC
  Administered 2024-05-19 – 2024-05-20 (×2): 81 mg via ORAL
  Filled 2024-05-19 (×2): qty 1

## 2024-05-19 MED ORDER — PANTOPRAZOLE SODIUM 40 MG PO TBEC
40.0000 mg | DELAYED_RELEASE_TABLET | Freq: Two times a day (BID) | ORAL | Status: DC
  Administered 2024-05-19 – 2024-05-20 (×2): 40 mg via ORAL
  Filled 2024-05-19 (×2): qty 1

## 2024-05-19 NOTE — TOC Progression Note (Signed)
 Transition of Care [facility]) - Progression Note    Patient Details  Name: Brittany Middleton MRN: [id number] Date of Birth: [birth year]  Transition of Care [facility]) CM/SW Contact  Sonda Manuella Quill, RN Phone Number: 05/19/2024, 3:53 PM  Clinical Narrative:    Spoke w/ pt in room; she requested resources for social services and financial assistance; resources placed in d/c instructions; copy of resources also given to pt; she will make appointments w/ agencies of choic; IP CM following.   Expected Discharge Plan: Home w Home Health Services Barriers to Discharge: Continued Medical Work up               Expected Discharge Plan and Services   Discharge Planning Services: CM Consult Post Acute Care Choice: Home Health Living arrangements for the past 2 months: Single Family Home                 DME Arranged: N/A DME Agency: NA       HH Arranged: PT HH Agency: [facility] Date [facility] Agency Contacted: 05/19/24 Time HH Agency Contacted: 1310 Representative spoke with at [facility] Agency: Darleene   Social Drivers of Health (SDOH) Interventions SDOH Screenings   Food Insecurity: No Food Insecurity (05/16/2024)  Housing: Low Risk  (05/16/2024)  Transportation Needs: No Transportation Needs (05/16/2024)  Utilities: Not At Risk (05/16/2024)  Financial Resource Strain: Low Risk  (02/27/2018)  Physical Activity: Sufficiently Active (02/27/2018)  Social Connections: Moderately Isolated (05/16/2024)  Stress: No Stress Concern Present (02/27/2018)  Tobacco Use: Low Risk  (05/16/2024)    Readmission Risk Interventions    05/19/2024   12:47 PM 04/03/2024    2:11 PM  Readmission Risk Prevention Plan  Post Dischage Appt  Complete  Medication Screening  Complete  Transportation Screening Complete Complete  PCP or Specialist Appt within 5-7 Days Complete   Home Care Screening Complete   Medication Review (RN CM) Complete

## 2024-05-19 NOTE — Progress Notes (Signed)
   05/19/24 2059  BiPAP/CPAP/SIPAP  BiPAP/CPAP/SIPAP Pt Type Adult  BiPAP/CPAP/SIPAP Resmed  Mask Type Nasal pillows  Dentures removed? Not applicable  FiO2 (%) 21 %  Heater Temperature  (deckines humidification)  Patient Home Machine Yes  Safety Check Completed by RT for Home Unit Yes, no issues noted  Patient Home Mask Yes  Patient Home Tubing Yes  Auto Titrate Yes  Minimum cmH2O 5 cmH2O  Maximum cmH2O 10 cmH2O  Device Plugged into RED Power Outlet Yes  BiPAP/CPAP /SiPAP Vitals  Pulse Rate 71  Resp 16  SpO2 96 %  Bilateral Breath Sounds Clear;Diminished  MEWS Score/Color  MEWS Score 0  MEWS Score Color Landy

## 2024-05-19 NOTE — Progress Notes (Signed)
 No acute changes this shift , patient took meds as ordered, transitioned to oral Protonix , tolerated diet advancement, did not have a BM this shift, but has not had solid food until today , in chair in room watching tv, call light in reach

## 2024-05-19 NOTE — Plan of Care (Signed)
  Problem: Education: Goal: Knowledge of General Education information will improve Description: Including pain rating scale, medication(s)/side effects and non-pharmacologic comfort measures Outcome: Progressing   Problem: Health Behavior/Discharge Planning: Goal: Ability to manage health-related needs will improve Outcome: Progressing   Problem: Clinical Measurements: Goal: Ability to maintain clinical measurements within normal limits will improve Outcome: Progressing Goal: Will remain free from infection Outcome: Progressing Goal: Diagnostic test results will improve Outcome: Progressing Goal: Respiratory complications will improve Outcome: Progressing Goal: Cardiovascular complication will be avoided Outcome: Progressing   Problem: Activity: Goal: Risk for activity intolerance will decrease Outcome: Progressing   Problem: Nutrition: Goal: Adequate nutrition will be maintained Outcome: Progressing   Problem: Coping: Goal: Level of anxiety will decrease Outcome: Progressing   Problem: Elimination: Goal: Will not experience complications related to bowel motility Outcome: Progressing Goal: Will not experience complications related to urinary retention Outcome: Progressing   Problem: Pain Managment: Goal: General experience of comfort will improve and/or be controlled Outcome: Progressing   Problem: Safety: Goal: Ability to remain free from injury will improve Outcome: Progressing   Problem: Skin Integrity: Goal: Risk for impaired skin integrity will decrease Outcome: Progressing   Problem: Education: Goal: Understanding of CV disease, CV risk reduction, and recovery process will improve Outcome: Progressing Goal: Individualized Educational Video(s) Outcome: Progressing   Problem: Cardiovascular: Goal: Ability to achieve and maintain adequate cardiovascular perfusion will improve Outcome: Progressing Goal: Vascular access site(s) Level 0-1 will be  maintained Outcome: Progressing   Problem: Health Behavior/Discharge Planning: Goal: Ability to safely manage health-related needs after discharge will improve Outcome: Progressing

## 2024-05-19 NOTE — Progress Notes (Signed)
 VASCULAR LAB    Duplex of right groin to rule out pseudoaneurysm has been performed.  See CV proc for preliminary results.   Najmah Carradine, RVT 05/19/2024, 10:47 AM

## 2024-05-19 NOTE — Progress Notes (Signed)
 Subjective: No abdominal pain. No blood in stool.  Objective: Vital signs in last 24 hours: Temp:  [98 F (36.7 C)-98.6 F (37 C)] 98.6 F (37 C) (11/29 9385) Pulse Rate:  [66-71] 67 (11/29 0614) Resp:  [16-18] 17 (11/29 0614) BP: (111-144)/(60-69) 144/69 (11/29 0614) SpO2:  [95 %-99 %] 95 % (11/29 0614) FiO2 (%):  [21 %] 21 % (11/28 2058) Weight change:  Last BM Date : 05/16/24  PE: GEN:  Overweight, NAD ABD:  Soft, protuberant, non-tender LUNGS:  No visible distress NEURO:  No encephalopathy  Lab Results: CBC    Component Value Date/Time   WBC 5.4 05/19/2024 0538   RBC 2.75 (L) 05/19/2024 0538   HGB 7.6 (L) 05/19/2024 0538   HGB 13.3 05/04/2022 1337   HGB 13.9 12/20/2019 0829   HCT 24.4 (L) 05/19/2024 0538   HCT 42.5 12/20/2019 0829   PLT 189 05/19/2024 0538   PLT 206 05/04/2022 1337   PLT 207 12/20/2019 0829   MCV 88.7 05/19/2024 0538   MCV 93 12/20/2019 0829   MCH 27.6 05/19/2024 0538   MCHC 31.1 05/19/2024 0538   RDW 20.1 (H) 05/19/2024 0538   RDW 13.5 12/20/2019 0829   LYMPHSABS 0.7 05/15/2024 0927   LYMPHSABS 1.3 12/20/2019 0829   MONOABS 0.3 05/15/2024 0927   EOSABS 0.1 05/15/2024 0927   EOSABS 0.2 12/20/2019 0829   BASOSABS 0.0 05/15/2024 0927   BASOSABS 0.0 12/20/2019 0829  CMP     Component Value Date/Time   NA 140 05/18/2024 0516   NA 143 12/20/2019 0829   K 3.6 05/18/2024 0516   CL 107 05/18/2024 0516   CO2 27 05/18/2024 0516   GLUCOSE 101 (H) 05/18/2024 0516   BUN 7 (L) 05/18/2024 0516   BUN 16 12/20/2019 0829   CREATININE 0.57 05/18/2024 0516   CREATININE 0.62 10/26/2022 0815   CALCIUM  8.0 (L) 05/18/2024 0516   PROT 6.7 05/15/2024 2105   PROT 6.4 02/04/2020 1036   ALBUMIN 4.1 05/15/2024 2105   ALBUMIN 4.2 02/04/2020 1036   AST 24 05/15/2024 2105   AST 17 10/26/2022 0815   ALT 14 05/15/2024 2105   ALT 17 10/26/2022 0815   ALKPHOS 81 05/15/2024 2105   BILITOT 0.4 05/15/2024 2105   BILITOT 0.9 10/26/2022 0815   GFRNONAA >60  05/18/2024 0516   GFRNONAA >60 10/26/2022 0815    Assessment:  Hematochezia, diverticular, s/p embolization left colic artery.  No further bleeding. Acute blood loss anemia  Plan:   Hgb stable; follow; do not seen need for transfusion at this point. Advance diet as tolerated. [facility] will sign-off; patient should make follow-up appt with is 6-8 weeks after hospital discharge; please call with questions; thank you for the consultation.   Brittany Middleton 05/19/2024, 12:50 PM   Cell [phone] If no answer or after 5 PM call [phone]

## 2024-05-19 NOTE — Plan of Care (Signed)
  Problem: Education: Goal: Knowledge of General Education information will improve Description: Including pain rating scale, medication(s)/side effects and non-pharmacologic comfort measures Outcome: Progressing   Problem: Health Behavior/Discharge Planning: Goal: Ability to manage health-related needs will improve Outcome: Progressing   Problem: Clinical Measurements: Goal: Respiratory complications will improve Outcome: Progressing   Problem: Activity: Goal: Risk for activity intolerance will decrease Outcome: Progressing   Problem: Nutrition: Goal: Adequate nutrition will be maintained Outcome: Progressing   Problem: Coping: Goal: Level of anxiety will decrease Outcome: Progressing   Problem: Skin Integrity: Goal: Risk for impaired skin integrity will decrease Outcome: Progressing

## 2024-05-19 NOTE — TOC Initial Note (Addendum)
 Transition of Care [facility]) - Initial/Assessment Note    Patient Details  Name: Brittany Middleton MRN: [id number] Date of Birth: 12/28/1950  [facility]) CM/SW Contact:    Sonda Manuella Quill, RN Phone Number: 05/19/2024, 12:50 PM  Clinical Narrative:                 Orders received for HHPT; spoke w/ pt in room; pt said she lives at home w/ her mother; she plans to return w/ family support at d/c; her son/POC Dotty Gonzalo ([phone]) will provide transportation; insurance/PCP verified; she denied SDOH risks; pt has cpap; she does not have HH services or home oxygen; pt agreed to receive recc HHPT; her agency preference is [facility]; referral given to Cory at agency; awaiting decision; will update pt.  -[id number]- notified by [facility] agency can accept referral; agency contact info placed in follow up provider section of d/c instructions; pt notified. Expected Discharge Plan: Home w Home Health Services Barriers to Discharge: Continued Medical Work up   Patient Goals and CMS Choice Patient states their goals for this hospitalization and ongoing recovery are:: home CMS Medicare.gov Compare Post Acute Care list provided to:: Patient   [place] ownership interest in [facility].provided to:: Patient    Expected Discharge Plan and Services   Discharge Planning Services: CM Consult Post Acute Care Choice: Home Health Living arrangements for the past 2 months: Single Family Home                 DME Arranged: N/A DME Agency: NA                  Prior Living Arrangements/Services Living arrangements for the past 2 months: Single Family Home Lives with:: Relatives Patient language and need for interpreter reviewed:: Yes Do you feel safe going back to the place where you live?: Yes      Need for Family Participation in Patient Care: Yes (Comment) Care giver support system in place?: Yes (comment) Current home services: DME (cpap) Criminal Activity/Legal  Involvement Pertinent to Current Situation/Hospitalization: No - Comment as needed  Activities of Daily Living   ADL Screening (condition at time of admission) Independently performs ADLs?: Yes (appropriate for developmental age) Is the patient deaf or have difficulty hearing?: No Does the patient have difficulty seeing, even when wearing glasses/contacts?: No Does the patient have difficulty concentrating, remembering, or making decisions?: No  Permission Sought/Granted Permission sought to share information with : Case Manager Permission granted to share information with : Yes, Verbal Permission Granted  Share Information with NAME: Case Manager     Permission granted to share info w Relationship: Kaymarie Wynn (son) [phone]     Emotional Assessment Appearance:: Appears stated age Attitude/Demeanor/Rapport: Gracious Affect (typically observed): Accepting Orientation: : Oriented to Self, Oriented to Place, Oriented to  Time, Oriented to Situation   Psych Involvement: No (comment)  Admission diagnosis:  GI bleed [K92.2] Diverticular hemorrhage [K57.31] Patient Active Problem List   Diagnosis Date Noted   GI bleed 05/16/2024   Diverticular hemorrhage 05/16/2024   Obesity, Class II, BMI 35-39.9 05/16/2024   History of ovarian cancer 05/16/2024   History of fallopian tube cancer 05/16/2024   Acute lower GI bleeding 04/01/2024   Leukopenia 10/29/2022   Hernia of abdominal wall 05/06/2022   Vaginitis 01/28/2022   Other constipation 01/28/2022   Abdominal bloating 09/23/2021   Anemia due to antineoplastic chemotherapy 05/22/2021   Weight loss, abnormal 04/21/2021   Pancytopenia, acquired (HCC)  03/06/2021   Peripheral neuropathy due to chemotherapy 03/06/2021   Coronary artery calcification 03/05/2021   Arthritis 03/04/2021   GERD (gastroesophageal reflux disease) 03/04/2021   Pneumonia 03/04/2021   Genetic testing 02/13/2021   Family history of breast cancer  01/27/2021   Family history of lung cancer 01/27/2021   Dysuria 01/26/2021   Physical deconditioning 01/10/2021   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   Dyspnea 06/19/2020   Epigastric pain 06/19/2020   Hematuria 06/19/2020   Hyperglycemia 06/19/2020   Abdominal pain 05/28/2020   Recurrent UTI 05/28/2020   Chronic asthma    OSA on CPAP    Cervical disc disease    Hepatic steatosis    History of palpitations    Hypercholesteremia    Hypertension    Lumbar disc disease    Pyelonephritis 03/30/2020   Gastrointestinal bleeding 07/25/2018   Hiatal hernia    Anemia 07/17/2018   Acute blood loss anemia 07/17/2018   Hypokalemia 07/17/2018   Gastrointestinal hemorrhage with melena 07/16/2018   Obesity, Class III, 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   Overweight 03/03/2018   Coronary artery aneurysm 03/03/2018   Chest pain with moderate risk for cardiac etiology 02/27/2018   HTN (hypertension) 02/27/2018   HLD (hyperlipidemia) 02/27/2018   Hypercholesterolemia 04/07/2017   Hypertensive disorder 04/07/2017   Pure hypercholesterolemia, unspecified 04/07/2017   Essential hypertension 04/07/2017   Palpitations 08/19/2015   PCP:  Dyane Anthony RAMAN, FNP Pharmacy:   [facility] [address] [place] [address] Phone: [phone] Fax: [phone]  [facility] [id number] [place], [place] - [address] [place] [address] Phone: [phone] Fax: [phone]  [facility] [address] [place] [address] Phone: [phone] Fax: [phone]     Social Drivers of Health (SDOH) Social History: SDOH Screenings   Food Insecurity: No Food Insecurity (05/16/2024)  Housing: Low Risk  (05/16/2024)  Transportation Needs: No Transportation  Needs (05/16/2024)  Utilities: Not At Risk (05/16/2024)  Financial Resource Strain: Low Risk  (02/27/2018)  Physical Activity: Sufficiently Active (02/27/2018)  Social Connections: Moderately Isolated (05/16/2024)  Stress: No Stress Concern Present (02/27/2018)  Tobacco Use: Low Risk  (05/16/2024)   SDOH Interventions:     Readmission Risk Interventions    05/19/2024   12:47 PM 04/03/2024    2:11 PM  Readmission Risk Prevention Plan  Post Dischage Appt  Complete  Medication Screening  Complete  Transportation Screening Complete Complete  PCP or Specialist Appt within 5-7 Days Complete   Home Care Screening Complete   Medication Review (RN CM) Complete

## 2024-05-19 NOTE — Progress Notes (Signed)
 Referring Physician(s): Rogelia Satterfield  Supervising Physician: Johann Sieving  Patient Status:  [facility] - In-pt  Chief Complaint: GI bleed s/p embolizaton  Subjective:  Lying in bed.  Son at bedside.  Reports ongoing discomfort in the right groin particularly with change from seated to standing position.  Vas US  completed this afternoon with prelim report neg or hematoma or PSA.  Hgb variable, but within range of expected norm after recent GI bleed.   Allergies: Macrobid  [nitrofurantoin ], Ciprofloxacin, Codeine, and Azithromycin  Medications: Prior to Admission medications   Medication Sig Start Date End Date Taking? Authorizing Provider  acetaminophen  (TYLENOL ) 500 MG tablet Take 500-1,000 mg by mouth See admin instructions. Take 1000 mg by mouth in the morning and 500 mg in the evening   Yes [provider]  albuterol  (VENTOLIN  HFA) 108 (90 Base) MCG/ACT inhaler Inhale 2 puffs into the lungs every 6 (six) hours as needed for wheezing or shortness of breath. 07/26/23  Yes Kara Dorn NOVAK, MD  aspirin  EC 81 MG tablet Take 81 mg by mouth daily. Swallow whole.   Yes [provider]  atorvastatin  (LIPITOR) 40 MG tablet Take 1 tablet (40 mg total) by mouth daily. 07/05/22  Yes Swinyer, Rosaline HERO, NP  budesonide -formoterol  (BREYNA ) 80-4.5 MCG/ACT inhaler Inhale 2 puffs into the lungs 2 (two) times daily as needed. Patient taking differently: Inhale 2 puffs into the lungs 2 (two) times daily as needed (wheezing and SOB). 07/26/23  Yes Kara Dorn NOVAK, MD  cholecalciferol  (VITAMIN D3) 25 MCG (1000 UNIT) tablet Take 1,000 Units by mouth daily.   Yes [provider]  clopidogrel  (PLAVIX ) 75 MG tablet Take 75 mg by mouth daily.   Yes [provider]  co-enzyme Q-10 30 MG capsule Take 30 mg by mouth daily. 09/17/21  Yes [provider]  Collagen-Vitamin C-Biotin (COLLAGEN PO) Take 1 Scoop by mouth daily.   Yes [provider]  estradiol   (ESTRACE  VAGINAL) 0.1 MG/GM vaginal cream Place 1 Applicatorful vaginally at bedtime. Patient taking differently: Place 1 Applicatorful vaginally 3 (three) times a week. 01/28/22  Yes Gorsuch, Ni, MD  ezetimibe  (ZETIA ) 10 MG tablet Take 10 mg by mouth daily.   Yes [provider]  hydrocortisone 2.5 % cream Apply 1 Application topically daily as needed. 10/14/21  Yes [provider]  loratadine  (CLARITIN ) 10 MG tablet Take 10 mg by mouth daily.   Yes [provider]  losartan  (COZAAR ) 50 MG tablet Take 50 mg by mouth daily. 10/02/21  Yes [provider]  Melatonin 5 MG CHEW Chew 20 mg by mouth at bedtime.   Yes [provider]  metFORMIN (GLUCOPHAGE) 500 MG tablet Take 500 mg by mouth 2 (two) times daily. 10/03/21  Yes [provider]  Omega 3 1000 MG CAPS Take 1 capsule by mouth daily.   Yes [provider]  omeprazole  (PRILOSEC) 40 MG capsule Take 40 mg by mouth daily.   Yes [provider]  OZEMPIC, 0.25 OR 0.5 MG/DOSE, 2 MG/3ML SOPN Inject 0.5 mg into the skin once a week. 08/23/23  Yes [provider]  polyethylene glycol (MIRALAX  / GLYCOLAX ) 17 g packet Take 17 g by mouth daily.   Yes [provider]  Probiotic Product (PROBIOTIC BLEND PO) Take 1 capsule by mouth daily.   Yes [provider]  UNABLE TO FIND Take 1 capsule by mouth in the morning and at bedtime. Med Name: Turkey Tail Mushroom   Yes [provider]  Wheat Dextrin (BENEFIBER PO) Take 15 mLs by mouth in the morning and at bedtime.   Yes [provider]     Vital Signs: BP (!) 144/69 (BP Location: Right Arm)   Pulse 67   Temp 98.6 F (37 C) (Oral)   Resp 17   Ht 5' 4 (1.626 m)   Wt 240 lb 1.3 oz (108.9 kg)   SpO2 95%   BMI 41.21 kg/m   Physical Exam Cardiovascular:     Rate and Rhythm: Normal rate.     Comments: PT palpable.  DP faintly palpable.  Foot warm with good color Pulmonary:     Effort:  Pulmonary effort is normal. No respiratory distress.  Skin:    General: Skin is warm.     Comments: Right groin site- Mild tenderness to palpation.  No longer any discrete firmness or palpable hematoma. No bruising. Thigh soft and symmetric.   Left groin site- No erythema, ecchymosis, drainage to site Non-tender to palpation  Neurological:     Mental Status: She is alert and oriented to person, place, and time.  Psychiatric:        Mood and Affect: Mood normal.        Behavior: Behavior normal.     Imaging: VAS US  GROIN PSEUDOANEURYSM Result Date: 05/19/2024  ARTERIAL PSEUDOANEURYSM  Patient Name:  Brittany Middleton  Date of Exam:   05/19/2024 Medical Rec #: [id number]            Accession #:    [id number] Date of Birth: [birth year]            Patient Gender: F Patient Age:   10 years Exam Location:  [facility] Procedure:      VAS US  QUILLIAN CAPES Referring Phys: DELON HOE --------------------------------------------------------------------------------  Exam: Right groin Indications: Patient complains of groin pain and palpable knot the size of a pea.. History: Acute lower gastrointestinal hemorrhage s/p provocative angiogram and embolization of distal branches of the left colic artery with cessation of bleeding with Dr Karalee on 05/16/2024. Limitations: Tissue properties. Body habitus. Comparison Study: No prior study on file Performing Technologist: Alberta Lis RVS  Examination Guidelines: A complete evaluation includes B-mode imaging, spectral Doppler, color Doppler, and power Doppler as needed of all accessible portions of each vessel. Bilateral testing is considered an integral part of a complete examination. Limited examinations for reoccurring indications may be performed as noted.  Summary: No evidence of pseudoaneurysm, AVF or DVT    --------------------------------------------------------------------------------    Preliminary    IR Angiogram Visceral  Selective Result Date: 05/17/2024 INDICATION: 73 year old female with ongoing acute lower GI bleed. CT arteriography was positive late yesterday and patient underwent angiography earlier this morning which was negative for evidence of active bleeding. Patient then did well throughout much of the day before developing recurrent bright red blood per rectum and need for additional transfusion this afternoon. CT arteriography was repeated and confirmed recurrent bleeding in the mid descending colon. Therefore, patient presents for repeat arteriography and embolization. EXAM: SELECTIVE VISCERAL ARTERIOGRAPHY; ADDITIONAL ARTERIOGRAPHY; IR EMBO ART VEN HEMORR LYMPH EXTRAV INC GUIDE ROADMAPPING; IR ULTRASOUND GUIDANCE VASC ACCESS LEFT MEDICATIONS: 5000 units of heparin  was administered intravenously by the Radiology nurse. 4 mg tPA and 200 mcg nitroglycerin  were administered intra-arterially by me. ANESTHESIA/SEDATION: Moderate (conscious) sedation was employed during this procedure. A total of Versed  2 mg and Fentanyl  200 mcg was administered intravenously by the Radiology nurse. Moderate Sedation Time: 98 minutes. The patient's level of consciousness  and vital signs were monitored continuously by radiology nursing throughout the procedure under my direct supervision. CONTRAST:  50mL OMNIPAQUE  IOHEXOL  300 MG/ML SOLN, 40mL OMNIPAQUE  IOHEXOL  300 MG/ML SOLN, 50mL OMNIPAQUE  IOHEXOL  300 MG/ML SOLN FLUOROSCOPY: Radiation Exposure Index (as provided by the fluoroscopic device): 2684 mGy Kerma COMPLICATIONS: SIR Level A - No therapy, no consequence. PROCEDURE: Informed consent was obtained from the patient following explanation of the procedure, risks, benefits and alternatives. The patient understands, agrees and consents for the procedure. All questions were addressed. A time out was performed prior to the initiation of the procedure. Maximal barrier sterile technique utilized including caps, mask, sterile gowns, sterile  gloves, large sterile drape, hand hygiene, and Betadine prep. The left common femoral artery was interrogated with ultrasound and found to be widely patent. An image was obtained and stored for the medical record. Local anesthesia was attained by infiltration with 1% lidocaine . A small dermatotomy was made. Under real-time sonographic guidance, the vessel was punctured with a 21 gauge micropuncture needle. Using standard technique, the initial micro needle was exchanged over a 0.018 micro wire for a transitional 4 French micro sheath. The micro sheath was then exchanged over a 0.035 wire for a 5 French vascular sheath. Unfortunately, the department is out of RIM catheters. Therefore a Sos Omni selective catheter was advanced over a Bentson wire into the abdominal aorta and formed. The catheter was used to select the origin of the IMA and arteriography was performed. No active bleeding on the initial angiogram. Given the clearly defined location of bleeding in the mid descending artery, a Cook cantata 2.5 French microcatheter was therefore used to select the left colic artery and arteriography was again performed. Again, no active bleeding. The microcatheter was further advanced into the descending branch of the left colic artery and arteriography was performed. No evidence of active bleeding. The microcatheter was pulled back and advanced into the ascending branch of the left colic artery and arteriography was performed. No evidence of active bleeding. Anatomic leak, the region of colon most corresponding to the recent visualized bleed on CT arteriography is supplied by the descending branch of the left colic artery. Therefore, the microcatheter was again advanced into the descending branch of the left colic artery and advanced distally just proximal to its bifurcation into the many Vasa recta branches. At this time, heparin  was administered intravenously. TPA was prepared and 4 mg of tPA was administered through  the catheter followed by a total of 200 mcg of nitroglycerin . After several minutes additional arteriography was performed. This time, active bleeding is visualized from 1 of the tortuous distal branches. The microcatheter was used to select several of the small branches in till the active bleeding was successfully visualized. At this time, coil embolization was performed using a series of detachable penumbra microcoils. Follow-up arteriography demonstrates no further active bleeding. The microcatheter was removed. Contrast was injected through the base catheter at the origin of the IMA. Unfortunately, this resulted in a small iatrogenic dissection of the proximal IMA. The catheter was moved and repeat injection performed gingerly. The dissection is not flow limiting. This is likely of no consequence and should heal without any further intervention. IMPRESSION: 1. Successful provocative mesenteric angiogram with recreation and visualization of the site of bleeding. 2. Successful coil embolization of distal branches of the descending branch of the left colic artery with cessation of bleeding. 3. 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 This is likely of no consequence and should  heal without any further intervention. Electronically Signed   By: Wilkie Lent M.D.   On: 05/17/2024 07:29   IR US  Guide Vasc Access Left Result Date: 05/17/2024 INDICATION: 73 year old female with ongoing acute lower GI bleed. CT arteriography was positive late yesterday and patient underwent angiography earlier this morning which was negative for evidence of active bleeding. Patient then did well throughout much of the day before developing recurrent bright red blood per rectum and need for additional transfusion this afternoon. CT arteriography was repeated and confirmed recurrent bleeding in the mid descending colon. Therefore, patient presents for repeat arteriography and embolization. EXAM: SELECTIVE  VISCERAL ARTERIOGRAPHY; ADDITIONAL ARTERIOGRAPHY; IR EMBO ART VEN HEMORR LYMPH EXTRAV INC GUIDE ROADMAPPING; IR ULTRASOUND GUIDANCE VASC ACCESS LEFT MEDICATIONS: 5000 units of heparin  was administered intravenously by the Radiology nurse. 4 mg tPA and 200 mcg nitroglycerin  were administered intra-arterially by me. ANESTHESIA/SEDATION: Moderate (conscious) sedation was employed during this procedure. A total of Versed  2 mg and Fentanyl  200 mcg was administered intravenously by the Radiology nurse. Moderate Sedation Time: 98 minutes. The patient's level of consciousness and vital signs were monitored continuously by radiology nursing throughout the procedure under my direct supervision. CONTRAST:  50mL OMNIPAQUE  IOHEXOL  300 MG/ML SOLN, 40mL OMNIPAQUE  IOHEXOL  300 MG/ML SOLN, 50mL OMNIPAQUE  IOHEXOL  300 MG/ML SOLN FLUOROSCOPY: Radiation Exposure Index (as provided by the fluoroscopic device): 2684 mGy Kerma COMPLICATIONS: SIR Level A - No therapy, no consequence. PROCEDURE: Informed consent was obtained from the patient following explanation of the procedure, risks, benefits and alternatives. The patient understands, agrees and consents for the procedure. All questions were addressed. A time out was performed prior to the initiation of the procedure. Maximal barrier sterile technique utilized including caps, mask, sterile gowns, sterile gloves, large sterile drape, hand hygiene, and Betadine prep. The left common femoral artery was interrogated with ultrasound and found to be widely patent. An image was obtained and stored for the medical record. Local anesthesia was attained by infiltration with 1% lidocaine . A small dermatotomy was made. Under real-time sonographic guidance, the vessel was punctured with a 21 gauge micropuncture needle. Using standard technique, the initial micro needle was exchanged over a 0.018 micro wire for a transitional 4 French micro sheath. The micro sheath was then exchanged over a 0.035 wire  for a 5 French vascular sheath. Unfortunately, the department is out of RIM catheters. Therefore a Sos Omni selective catheter was advanced over a Bentson wire into the abdominal aorta and formed. The catheter was used to select the origin of the IMA and arteriography was performed. No active bleeding on the initial angiogram. Given the clearly defined location of bleeding in the mid descending artery, a Cook cantata 2.5 French microcatheter was therefore used to select the left colic artery and arteriography was again performed. Again, no active bleeding. The microcatheter was further advanced into the descending branch of the left colic artery and arteriography was performed. No evidence of active bleeding. The microcatheter was pulled back and advanced into the ascending branch of the left colic artery and arteriography was performed. No evidence of active bleeding. Anatomic leak, the region of colon most corresponding to the recent visualized bleed on CT arteriography is supplied by the descending branch of the left colic artery. Therefore, the microcatheter was again advanced into the descending branch of the left colic artery and advanced distally just proximal to its bifurcation into the many Vasa recta branches. At this time, heparin  was administered intravenously. TPA was prepared and 4 mg  of tPA was administered through the catheter followed by a total of 200 mcg of nitroglycerin . After several minutes additional arteriography was performed. This time, active bleeding is visualized from 1 of the tortuous distal branches. The microcatheter was used to select several of the small branches in till the active bleeding was successfully visualized. At this time, coil embolization was performed using a series of detachable penumbra microcoils. Follow-up arteriography demonstrates no further active bleeding. The microcatheter was removed. Contrast was injected through the base catheter at the origin of the IMA.  Unfortunately, this resulted in a small iatrogenic dissection of the proximal IMA. The catheter was moved and repeat injection performed gingerly. The dissection is not flow limiting. This is likely of no consequence and should heal without any further intervention. IMPRESSION: 1. Successful provocative mesenteric angiogram with recreation and visualization of the site of bleeding. 2. Successful coil embolization of distal branches of the descending branch of the left colic artery with cessation of bleeding. 3. 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 This is likely of no consequence and should heal without any further intervention. Electronically Signed   By: Wilkie Lent M.D.   On: 05/17/2024 07:29   IR EMBO ART  VEN HEMORR LYMPH EXTRAV  INC GUIDE ROADMAPPING Result Date: 05/17/2024 INDICATION: 73 year old female with ongoing acute lower GI bleed. CT arteriography was positive late yesterday and patient underwent angiography earlier this morning which was negative for evidence of active bleeding. Patient then did well throughout much of the day before developing recurrent bright red blood per rectum and need for additional transfusion this afternoon. CT arteriography was repeated and confirmed recurrent bleeding in the mid descending colon. Therefore, patient presents for repeat arteriography and embolization. EXAM: SELECTIVE VISCERAL ARTERIOGRAPHY; ADDITIONAL ARTERIOGRAPHY; IR EMBO ART VEN HEMORR LYMPH EXTRAV INC GUIDE ROADMAPPING; IR ULTRASOUND GUIDANCE VASC ACCESS LEFT MEDICATIONS: 5000 units of heparin  was administered intravenously by the Radiology nurse. 4 mg tPA and 200 mcg nitroglycerin  were administered intra-arterially by me. ANESTHESIA/SEDATION: Moderate (conscious) sedation was employed during this procedure. A total of Versed  2 mg and Fentanyl  200 mcg was administered intravenously by the Radiology nurse. Moderate Sedation Time: 98 minutes. The patient's level of  consciousness and vital signs were monitored continuously by radiology nursing throughout the procedure under my direct supervision. CONTRAST:  50mL OMNIPAQUE  IOHEXOL  300 MG/ML SOLN, 40mL OMNIPAQUE  IOHEXOL  300 MG/ML SOLN, 50mL OMNIPAQUE  IOHEXOL  300 MG/ML SOLN FLUOROSCOPY: Radiation Exposure Index (as provided by the fluoroscopic device): 2684 mGy Kerma COMPLICATIONS: SIR Level A - No therapy, no consequence. PROCEDURE: Informed consent was obtained from the patient following explanation of the procedure, risks, benefits and alternatives. The patient understands, agrees and consents for the procedure. All questions were addressed. A time out was performed prior to the initiation of the procedure. Maximal barrier sterile technique utilized including caps, mask, sterile gowns, sterile gloves, large sterile drape, hand hygiene, and Betadine prep. The left common femoral artery was interrogated with ultrasound and found to be widely patent. An image was obtained and stored for the medical record. Local anesthesia was attained by infiltration with 1% lidocaine . A small dermatotomy was made. Under real-time sonographic guidance, the vessel was punctured with a 21 gauge micropuncture needle. Using standard technique, the initial micro needle was exchanged over a 0.018 micro wire for a transitional 4 French micro sheath. The micro sheath was then exchanged over a 0.035 wire for a 5 French vascular sheath. Unfortunately, the department is out of RIM catheters.  Therefore a Sos Omni selective catheter was advanced over a Bentson wire into the abdominal aorta and formed. The catheter was used to select the origin of the IMA and arteriography was performed. No active bleeding on the initial angiogram. Given the clearly defined location of bleeding in the mid descending artery, a Cook cantata 2.5 French microcatheter was therefore used to select the left colic artery and arteriography was again performed. Again, no active  bleeding. The microcatheter was further advanced into the descending branch of the left colic artery and arteriography was performed. No evidence of active bleeding. The microcatheter was pulled back and advanced into the ascending branch of the left colic artery and arteriography was performed. No evidence of active bleeding. Anatomic leak, the region of colon most corresponding to the recent visualized bleed on CT arteriography is supplied by the descending branch of the left colic artery. Therefore, the microcatheter was again advanced into the descending branch of the left colic artery and advanced distally just proximal to its bifurcation into the many Vasa recta branches. At this time, heparin  was administered intravenously. TPA was prepared and 4 mg of tPA was administered through the catheter followed by a total of 200 mcg of nitroglycerin . After several minutes additional arteriography was performed. This time, active bleeding is visualized from 1 of the tortuous distal branches. The microcatheter was used to select several of the small branches in till the active bleeding was successfully visualized. At this time, coil embolization was performed using a series of detachable penumbra microcoils. Follow-up arteriography demonstrates no further active bleeding. The microcatheter was removed. Contrast was injected through the base catheter at the origin of the IMA. Unfortunately, this resulted in a small iatrogenic dissection of the proximal IMA. The catheter was moved and repeat injection performed gingerly. The dissection is not flow limiting. This is likely of no consequence and should heal without any further intervention. IMPRESSION: 1. Successful provocative mesenteric angiogram with recreation and visualization of the site of bleeding. 2. Successful coil embolization of distal branches of the descending branch of the left colic artery with cessation of bleeding. 3. Small iatrogenic non flow limiting  dissection in the proximal IMA. This is likely of no consequence and should heal without any further intervention. Electronically Signed   By: Wilkie Lent M.D.   On: 05/17/2024 07:29   IR Angiogram Selective Each Additional Vessel Result Date: 05/17/2024 INDICATION: 73 year old female with ongoing acute lower GI bleed. CT arteriography was positive late yesterday and patient underwent angiography earlier this morning which was negative for evidence of active bleeding. Patient then did well throughout much of the day before developing recurrent bright red blood per rectum and need for additional transfusion this afternoon. CT arteriography was repeated and confirmed recurrent bleeding in the mid descending colon. Therefore, patient presents for repeat arteriography and embolization. EXAM: SELECTIVE VISCERAL ARTERIOGRAPHY; ADDITIONAL ARTERIOGRAPHY; IR EMBO ART VEN HEMORR LYMPH EXTRAV INC GUIDE ROADMAPPING; IR ULTRASOUND GUIDANCE VASC ACCESS LEFT MEDICATIONS: 5000 units of heparin  was administered intravenously by the Radiology nurse. 4 mg tPA and 200 mcg nitroglycerin  were administered intra-arterially by me. ANESTHESIA/SEDATION: Moderate (conscious) sedation was employed during this procedure. A total of Versed  2 mg and Fentanyl  200 mcg was administered intravenously by the Radiology nurse. Moderate Sedation Time: 98 minutes. The patient's level of consciousness and vital signs were monitored continuously by radiology nursing throughout the procedure under my direct supervision. CONTRAST:  50mL OMNIPAQUE  IOHEXOL  300 MG/ML SOLN, 40mL OMNIPAQUE  IOHEXOL  300 MG/ML SOLN, 50mL  OMNIPAQUE  IOHEXOL  300 MG/ML SOLN FLUOROSCOPY: Radiation Exposure Index (as provided by the fluoroscopic device): 2684 mGy Kerma COMPLICATIONS: SIR Level A - No therapy, no consequence. PROCEDURE: Informed consent was obtained from the patient following explanation of the procedure, risks, benefits and alternatives. The patient understands,  agrees and consents for the procedure. All questions were addressed. A time out was performed prior to the initiation of the procedure. Maximal barrier sterile technique utilized including caps, mask, sterile gowns, sterile gloves, large sterile drape, hand hygiene, and Betadine prep. The left common femoral artery was interrogated with ultrasound and found to be widely patent. An image was obtained and stored for the medical record. Local anesthesia was attained by infiltration with 1% lidocaine . A small dermatotomy was made. Under real-time sonographic guidance, the vessel was punctured with a 21 gauge micropuncture needle. Using standard technique, the initial micro needle was exchanged over a 0.018 micro wire for a transitional 4 French micro sheath. The micro sheath was then exchanged over a 0.035 wire for a 5 French vascular sheath. Unfortunately, the department is out of RIM catheters. Therefore a Sos Omni selective catheter was advanced over a Bentson wire into the abdominal aorta and formed. The catheter was used to select the origin of the IMA and arteriography was performed. No active bleeding on the initial angiogram. Given the clearly defined location of bleeding in the mid descending artery, a Cook cantata 2.5 French microcatheter was therefore used to select the left colic artery and arteriography was again performed. Again, no active bleeding. The microcatheter was further advanced into the descending branch of the left colic artery and arteriography was performed. No evidence of active bleeding. The microcatheter was pulled back and advanced into the ascending branch of the left colic artery and arteriography was performed. No evidence of active bleeding. Anatomic leak, the region of colon most corresponding to the recent visualized bleed on CT arteriography is supplied by the descending branch of the left colic artery. Therefore, the microcatheter was again advanced into the descending branch of  the left colic artery and advanced distally just proximal to its bifurcation into the many Vasa recta branches. At this time, heparin  was administered intravenously. TPA was prepared and 4 mg of tPA was administered through the catheter followed by a total of 200 mcg of nitroglycerin . After several minutes additional arteriography was performed. This time, active bleeding is visualized from 1 of the tortuous distal branches. The microcatheter was used to select several of the small branches in till the active bleeding was successfully visualized. At this time, coil embolization was performed using a series of detachable penumbra microcoils. Follow-up arteriography demonstrates no further active bleeding. The microcatheter was removed. Contrast was injected through the base catheter at the origin of the IMA. Unfortunately, this resulted in a small iatrogenic dissection of the proximal IMA. The catheter was moved and repeat injection performed gingerly. The dissection is not flow limiting. This is likely of no consequence and should heal without any further intervention. IMPRESSION: 1. Successful provocative mesenteric angiogram with recreation and visualization of the site of bleeding. 2. Successful coil embolization of distal branches of the descending branch of the left colic artery with cessation of bleeding. 3. 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 This is likely of no consequence and should heal without any further intervention. Electronically Signed   By: Wilkie Lent M.D.   On: 05/17/2024 07:29   IR Angiogram Selective Each Additional Vessel Result Date: 05/17/2024 INDICATION:  73 year old female with ongoing acute lower GI bleed. CT arteriography was positive late yesterday and patient underwent angiography earlier this morning which was negative for evidence of active bleeding. Patient then did well throughout much of the day before developing recurrent bright red blood  per rectum and need for additional transfusion this afternoon. CT arteriography was repeated and confirmed recurrent bleeding in the mid descending colon. Therefore, patient presents for repeat arteriography and embolization. EXAM: SELECTIVE VISCERAL ARTERIOGRAPHY; ADDITIONAL ARTERIOGRAPHY; IR EMBO ART VEN HEMORR LYMPH EXTRAV INC GUIDE ROADMAPPING; IR ULTRASOUND GUIDANCE VASC ACCESS LEFT MEDICATIONS: 5000 units of heparin  was administered intravenously by the Radiology nurse. 4 mg tPA and 200 mcg nitroglycerin  were administered intra-arterially by me. ANESTHESIA/SEDATION: Moderate (conscious) sedation was employed during this procedure. A total of Versed  2 mg and Fentanyl  200 mcg was administered intravenously by the Radiology nurse. Moderate Sedation Time: 98 minutes. The patient's level of consciousness and vital signs were monitored continuously by radiology nursing throughout the procedure under my direct supervision. CONTRAST:  50mL OMNIPAQUE  IOHEXOL  300 MG/ML SOLN, 40mL OMNIPAQUE  IOHEXOL  300 MG/ML SOLN, 50mL OMNIPAQUE  IOHEXOL  300 MG/ML SOLN FLUOROSCOPY: Radiation Exposure Index (as provided by the fluoroscopic device): 2684 mGy Kerma COMPLICATIONS: SIR Level A - No therapy, no consequence. PROCEDURE: Informed consent was obtained from the patient following explanation of the procedure, risks, benefits and alternatives. The patient understands, agrees and consents for the procedure. All questions were addressed. A time out was performed prior to the initiation of the procedure. Maximal barrier sterile technique utilized including caps, mask, sterile gowns, sterile gloves, large sterile drape, hand hygiene, and Betadine prep. The left common femoral artery was interrogated with ultrasound and found to be widely patent. An image was obtained and stored for the medical record. Local anesthesia was attained by infiltration with 1% lidocaine . A small dermatotomy was made. Under real-time sonographic guidance, the  vessel was punctured with a 21 gauge micropuncture needle. Using standard technique, the initial micro needle was exchanged over a 0.018 micro wire for a transitional 4 French micro sheath. The micro sheath was then exchanged over a 0.035 wire for a 5 French vascular sheath. Unfortunately, the department is out of RIM catheters. Therefore a Sos Omni selective catheter was advanced over a Bentson wire into the abdominal aorta and formed. The catheter was used to select the origin of the IMA and arteriography was performed. No active bleeding on the initial angiogram. Given the clearly defined location of bleeding in the mid descending artery, a Cook cantata 2.5 French microcatheter was therefore used to select the left colic artery and arteriography was again performed. Again, no active bleeding. The microcatheter was further advanced into the descending branch of the left colic artery and arteriography was performed. No evidence of active bleeding. The microcatheter was pulled back and advanced into the ascending branch of the left colic artery and arteriography was performed. No evidence of active bleeding. Anatomic leak, the region of colon most corresponding to the recent visualized bleed on CT arteriography is supplied by the descending branch of the left colic artery. Therefore, the microcatheter was again advanced into the descending branch of the left colic artery and advanced distally just proximal to its bifurcation into the many Vasa recta branches. At this time, heparin  was administered intravenously. TPA was prepared and 4 mg of tPA was administered through the catheter followed by a total of 200 mcg of nitroglycerin . After several minutes additional arteriography was performed. This time, active bleeding is visualized from 1  of the tortuous distal branches. The microcatheter was used to select several of the small branches in till the active bleeding was successfully visualized. At this time, coil  embolization was performed using a series of detachable penumbra microcoils. Follow-up arteriography demonstrates no further active bleeding. The microcatheter was removed. Contrast was injected through the base catheter at the origin of the IMA. Unfortunately, this resulted in a small iatrogenic dissection of the proximal IMA. The catheter was moved and repeat injection performed gingerly. The dissection is not flow limiting. This is likely of no consequence and should heal without any further intervention. IMPRESSION: 1. Successful provocative mesenteric angiogram with recreation and visualization of the site of bleeding. 2. Successful coil embolization of distal branches of the descending branch of the left colic artery with cessation of bleeding. 3. 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 This is likely of no consequence and should heal without any further intervention. Electronically Signed   By: Wilkie Lent M.D.   On: 05/17/2024 07:29   CT ANGIO GI BLEED Result Date: 05/16/2024 CLINICAL DATA:  Active rectal bleeding EXAM: CTA ABDOMEN AND PELVIS WITHOUT AND WITH CONTRAST TECHNIQUE: Multidetector CT imaging of the abdomen and pelvis was performed using the standard protocol during bolus administration of intravenous contrast. Multiplanar reconstructed images and MIPs were obtained and reviewed to evaluate the vascular anatomy. RADIATION DOSE REDUCTION: This exam was performed according to the departmental dose-optimization program which includes automated exposure control, adjustment of the mA and/or kV according to patient size and/or use of iterative reconstruction technique. CONTRAST:  OMNIPAQUE  IOHEXOL  350 MG/ML SOLN COMPARISON:  05/16/2024, 05/15/2024 FINDINGS: VASCULAR Aorta: Normal caliber aorta without aneurysm, dissection, vasculitis or significant stenosis. Minimal atherosclerosis. Celiac: Patent without evidence of aneurysm, dissection, vasculitis or  significant stenosis. SMA: Patent without evidence of aneurysm, dissection, vasculitis or significant stenosis. Renals: Both renal arteries are patent without evidence of aneurysm, dissection, vasculitis, fibromuscular dysplasia or significant stenosis. Mild atherosclerosis of the left renal artery. IMA: Patent without evidence of aneurysm, dissection, vasculitis or significant stenosis. Inflow: Patent without evidence of aneurysm, dissection, vasculitis or significant stenosis. Mild atherosclerosis. Proximal Outflow: Bilateral common femoral and visualized portions of the superficial and profunda femoral arteries are patent without evidence of aneurysm, dissection, vasculitis or significant stenosis. Postprocedural changes are seen within the right common femoral artery related to recent visceral arteriogram. There is a 0.8 x 0.7 x 1.1 cm pseudoaneurysm off the ventral margin of the common femoral artery, with a patent narrow neck measuring 0.3 cm supplying the pseudoaneurysm. Mild surrounding subcutaneous fat stranding. Veins: No obvious venous abnormality within the limitations of this arterial phase study. Review of the MIP images confirms the above findings. NON-VASCULAR Lower chest: No acute pleural or parenchymal lung disease. Hepatobiliary: Gallbladder sludge versus vicarious excretion of previously administered contrast. No cholecystitis. The liver is unremarkable. Pancreas: Unremarkable. No pancreatic ductal dilatation or surrounding inflammatory changes. Spleen: Normal in size without focal abnormality. Adrenals/Urinary Tract: Adrenal glands are unremarkable. Kidneys are normal, without renal calculi, focal lesion, or hydronephrosis. Nonspecific gas in the bladder lumen, please correlate with any recent instrumentation or catheterization. Stomach/Bowel: There is intraluminal accumulation of contrast along the mesenteric wall of the mid descending colon, reference axial images 95 through 108 of series 6.  This is in a similar distribution to where the active hemorrhage was reported on prior CTA, and is more subtle on this exam than prior. No other areas of active gastrointestinal hemorrhage are identified. No bowel  obstruction or ileus. Scattered gas fluid levels throughout the colon may reflect diarrhea. Small hiatal hernia. Lymphatic: No pathologic adenopathy. Reproductive: Status post hysterectomy. No adnexal masses. Other: No free fluid or free intraperitoneal gas. 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 No incarceration or obstruction. Musculoskeletal: No acute or destructive bony abnormalities. Reconstructed images demonstrate no additional findings. IMPRESSION: VASCULAR 1. Active gastrointestinal hemorrhage along the mesenteric wall of the mid descending colon, in a similar location where the active hemorrhage with seen on previous CT angiography procedure. Visceral angiogram performed earlier today did not demonstrate active hemorrhage in this region however. 2. 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 3.  Aortic Atherosclerosis (ICD10-I70.0). NON-VASCULAR 1. Colonic gas fluid levels consistent with diarrhea. 2. Stable midline ventral hernia. 3. Gallbladder sludge versus vicarious excretion of contrast. No evidence of acute cholecystitis. Critical Value/emergent results were called by telephone at the time of interpretation on 05/16/2024 at 6:52 pm to provider [facility] , who verbally acknowledged these results. Electronically Signed   By: Ozell Daring M.D.   On: 05/16/2024 18:59   IR Angiogram Visceral Selective Result Date: 05/16/2024 INDICATION: 73 year old female with history of acute lower gastrointestinal hemorrhage likely secondary to diverticulosis with associated anemia. EXAM: IR ULTRASOUND GUIDANCE VASC ACCESS RIGHT; ADDITIONAL ARTERIOGRAPHY; SELECTIVE  VISCERAL ARTERIOGRAPHY MEDICATIONS: None. ANESTHESIA/SEDATION: Moderate (conscious) sedation was employed during this procedure. A total of Versed  2 mg and Fentanyl  100 mcg was administered intravenously. Moderate Sedation Time: 20 minutes. The patient's level of consciousness and vital signs were monitored continuously by radiology nursing throughout the procedure under my direct supervision. CONTRAST:  OMNIPAQUE  IOHEXOL  350 MG/ML SOLN, 25mL OMNIPAQUE  IOHEXOL  300 MG/ML SOLN, 25mL OMNIPAQUE  IOHEXOL  300 MG/ML SOLN FLUOROSCOPY: Radiation Exposure Index (as provided by the fluoroscopic device): 613 mGy reference air Kerma COMPLICATIONS: None immediate. PROCEDURE: Informed consent was obtained from the patient following explanation of the procedure, risks, benefits and alternatives. The patient understands, agrees and consents for the procedure. All questions were addressed. A time out was performed prior to the initiation of the procedure. Maximal barrier sterile technique utilized including caps, mask, sterile gowns, sterile gloves, large sterile drape, hand hygiene, and Betadine prep. Preprocedure ultrasound evaluation of the right common femoral artery demonstrated patency. The procedure was planned. Subdermal Local anesthesia was administered at the planned needle entry site with 1% lidocaine . A small skin nick was made. Under direct ultrasound visualization, a 21 gauge micropuncture needle was directed into the right common femoral artery. A permanent cm captured and stored in the record. A micropuncture sheath was then introduced through which a limited right lower extremity angiogram was performed which demonstrated adequate puncture site for closure device use. A Bentson wire was directed to the abdominal aorta over which the micropuncture sheath was exchanged for a 5 French vascular sheath. A 5 French rim catheter was introduced over the wire and used to select the inferior mesenteric artery. Inferior  mesenteric angiogram was performed which demonstrated patency throughout with arterial supply to the descending and sigmoid colon. There was no obvious extravasation. A 2.4 French Progreat microcatheter and fathom 16 microwire were then used to select the left colic artery middle branch. Dedicated angiogram was performed at this location which demonstrated no evidence of active extravasation in the descending colon. A more distal, descending branch of the left colic artery was selected and repeat angiogram was performed. Again, no evidence  of active extravasation was seen at this site. The catheter was redirected to a superior left colic branch and repeat angiogram was performed at this location. Again, no evidence of active extravasation was visualized. Microcatheter was then retracted to the proximal inferior mesenteric artery and repeat angiogram was performed which demonstrated no evidence of active extravasation. The catheters were removed. The 5 French sheath was exchanged for 6 French Angio-Seal device which was deployed successfully without complication. Peripheral pulses were unchanged. A sterile bandage was applied. The patient tolerated the procedure well was transferred back to the ED in good condition. IMPRESSION: Technically successful inferior mesenteric angiogram with selective angiography of the left colic artery branches without evidence of active extravasation. No embolization was performed. Ester Sides, MD [facility] Radiology Electronically Signed   By: Ester Sides M.D.   On: 05/16/2024 11:17   IR US  Guide Vasc Access Right Result Date: 05/16/2024 INDICATION: 73 year old female with history of acute lower gastrointestinal hemorrhage likely secondary to diverticulosis with associated anemia. EXAM: IR ULTRASOUND GUIDANCE VASC ACCESS RIGHT; ADDITIONAL ARTERIOGRAPHY; SELECTIVE VISCERAL ARTERIOGRAPHY MEDICATIONS: None. ANESTHESIA/SEDATION:  Moderate (conscious) sedation was employed during this procedure. A total of Versed  2 mg and Fentanyl  100 mcg was administered intravenously. Moderate Sedation Time: 20 minutes. The patient's level of consciousness and vital signs were monitored continuously by radiology nursing throughout the procedure under my direct supervision. CONTRAST:  OMNIPAQUE  IOHEXOL  350 MG/ML SOLN, 25mL OMNIPAQUE  IOHEXOL  300 MG/ML SOLN, 25mL OMNIPAQUE  IOHEXOL  300 MG/ML SOLN FLUOROSCOPY: Radiation Exposure Index (as provided by the fluoroscopic device): 613 mGy reference air Kerma COMPLICATIONS: None immediate. PROCEDURE: Informed consent was obtained from the patient following explanation of the procedure, risks, benefits and alternatives. The patient understands, agrees and consents for the procedure. All questions were addressed. A time out was performed prior to the initiation of the procedure. Maximal barrier sterile technique utilized including caps, mask, sterile gowns, sterile gloves, large sterile drape, hand hygiene, and Betadine prep. Preprocedure ultrasound evaluation of the right common femoral artery demonstrated patency. The procedure was planned. Subdermal Local anesthesia was administered at the planned needle entry site with 1% lidocaine . A small skin nick was made. Under direct ultrasound visualization, a 21 gauge micropuncture needle was directed into the right common femoral artery. A permanent cm captured and stored in the record. A micropuncture sheath was then introduced through which a limited right lower extremity angiogram was performed which demonstrated adequate puncture site for closure device use. A Bentson wire was directed to the abdominal aorta over which the micropuncture sheath was exchanged for a 5 French vascular sheath. A 5 French rim catheter was introduced over the wire and used to select the inferior mesenteric artery. Inferior mesenteric angiogram was performed which demonstrated patency  throughout with arterial supply to the descending and sigmoid colon. There was no obvious extravasation. A 2.4 French Progreat microcatheter and fathom 16 microwire were then used to select the left colic artery middle branch. Dedicated angiogram was performed at this location which demonstrated no evidence of active extravasation in the descending colon. A more distal, descending branch of the left colic artery was selected and repeat angiogram was performed. Again, no evidence of active extravasation was seen at this site. The catheter was redirected to a superior left colic branch and repeat angiogram was performed at this location. Again, no evidence of active extravasation was visualized. Microcatheter was then retracted to the proximal inferior mesenteric artery and repeat angiogram was performed which demonstrated no  evidence of active extravasation. The catheters were removed. The 5 French sheath was exchanged for 6 French Angio-Seal device which was deployed successfully without complication. Peripheral pulses were unchanged. A sterile bandage was applied. The patient tolerated the procedure well was transferred back to the ED in good condition. IMPRESSION: Technically successful inferior mesenteric angiogram with selective angiography of the left colic artery branches without evidence of active extravasation. No embolization was performed. Ester Sides, MD [facility] Radiology Electronically Signed   By: Ester Sides M.D.   On: 05/16/2024 11:17   IR Angiogram Selective Each Additional Vessel Result Date: 05/16/2024 INDICATION: 73 year old female with history of acute lower gastrointestinal hemorrhage likely secondary to diverticulosis with associated anemia. EXAM: IR ULTRASOUND GUIDANCE VASC ACCESS RIGHT; ADDITIONAL ARTERIOGRAPHY; SELECTIVE VISCERAL ARTERIOGRAPHY MEDICATIONS: None. ANESTHESIA/SEDATION: Moderate (conscious) sedation was employed during  this procedure. A total of Versed  2 mg and Fentanyl  100 mcg was administered intravenously. Moderate Sedation Time: 20 minutes. The patient's level of consciousness and vital signs were monitored continuously by radiology nursing throughout the procedure under my direct supervision. CONTRAST:  OMNIPAQUE  IOHEXOL  350 MG/ML SOLN, 25mL OMNIPAQUE  IOHEXOL  300 MG/ML SOLN, 25mL OMNIPAQUE  IOHEXOL  300 MG/ML SOLN FLUOROSCOPY: Radiation Exposure Index (as provided by the fluoroscopic device): 613 mGy reference air Kerma COMPLICATIONS: None immediate. PROCEDURE: Informed consent was obtained from the patient following explanation of the procedure, risks, benefits and alternatives. The patient understands, agrees and consents for the procedure. All questions were addressed. A time out was performed prior to the initiation of the procedure. Maximal barrier sterile technique utilized including caps, mask, sterile gowns, sterile gloves, large sterile drape, hand hygiene, and Betadine prep. Preprocedure ultrasound evaluation of the right common femoral artery demonstrated patency. The procedure was planned. Subdermal Local anesthesia was administered at the planned needle entry site with 1% lidocaine . A small skin nick was made. Under direct ultrasound visualization, a 21 gauge micropuncture needle was directed into the right common femoral artery. A permanent cm captured and stored in the record. A micropuncture sheath was then introduced through which a limited right lower extremity angiogram was performed which demonstrated adequate puncture site for closure device use. A Bentson wire was directed to the abdominal aorta over which the micropuncture sheath was exchanged for a 5 French vascular sheath. A 5 French rim catheter was introduced over the wire and used to select the inferior mesenteric artery. Inferior mesenteric angiogram was performed which demonstrated patency throughout with arterial supply to the descending and  sigmoid colon. There was no obvious extravasation. A 2.4 French Progreat microcatheter and fathom 16 microwire were then used to select the left colic artery middle branch. Dedicated angiogram was performed at this location which demonstrated no evidence of active extravasation in the descending colon. A more distal, descending branch of the left colic artery was selected and repeat angiogram was performed. Again, no evidence of active extravasation was seen at this site. The catheter was redirected to a superior left colic branch and repeat angiogram was performed at this location. Again, no evidence of active extravasation was visualized. Microcatheter was then retracted to the proximal inferior mesenteric artery and repeat angiogram was performed which demonstrated no evidence of active extravasation. The catheters were removed. The 5 French sheath was exchanged for 6 French Angio-Seal device which was deployed successfully without complication. Peripheral pulses were unchanged. A sterile bandage was applied. The patient tolerated the procedure well was transferred back to the ED in good condition. IMPRESSION: Technically successful  inferior mesenteric angiogram with selective angiography of the left colic artery branches without evidence of active extravasation. No embolization was performed. Ester Sides, MD [facility] Radiology Electronically Signed   By: Ester Sides M.D.   On: 05/16/2024 11:17   IR Angiogram Selective Each Additional Vessel Result Date: 05/16/2024 INDICATION: 73 year old female with history of acute lower gastrointestinal hemorrhage likely secondary to diverticulosis with associated anemia. EXAM: IR ULTRASOUND GUIDANCE VASC ACCESS RIGHT; ADDITIONAL ARTERIOGRAPHY; SELECTIVE VISCERAL ARTERIOGRAPHY MEDICATIONS: None. ANESTHESIA/SEDATION: Moderate (conscious) sedation was employed during this procedure. A total of Versed  2 mg and Fentanyl   100 mcg was administered intravenously. Moderate Sedation Time: 20 minutes. The patient's level of consciousness and vital signs were monitored continuously by radiology nursing throughout the procedure under my direct supervision. CONTRAST:  OMNIPAQUE  IOHEXOL  350 MG/ML SOLN, 25mL OMNIPAQUE  IOHEXOL  300 MG/ML SOLN, 25mL OMNIPAQUE  IOHEXOL  300 MG/ML SOLN FLUOROSCOPY: Radiation Exposure Index (as provided by the fluoroscopic device): 613 mGy reference air Kerma COMPLICATIONS: None immediate. PROCEDURE: Informed consent was obtained from the patient following explanation of the procedure, risks, benefits and alternatives. The patient understands, agrees and consents for the procedure. All questions were addressed. A time out was performed prior to the initiation of the procedure. Maximal barrier sterile technique utilized including caps, mask, sterile gowns, sterile gloves, large sterile drape, hand hygiene, and Betadine prep. Preprocedure ultrasound evaluation of the right common femoral artery demonstrated patency. The procedure was planned. Subdermal Local anesthesia was administered at the planned needle entry site with 1% lidocaine . A small skin nick was made. Under direct ultrasound visualization, a 21 gauge micropuncture needle was directed into the right common femoral artery. A permanent cm captured and stored in the record. A micropuncture sheath was then introduced through which a limited right lower extremity angiogram was performed which demonstrated adequate puncture site for closure device use. A Bentson wire was directed to the abdominal aorta over which the micropuncture sheath was exchanged for a 5 French vascular sheath. A 5 French rim catheter was introduced over the wire and used to select the inferior mesenteric artery. Inferior mesenteric angiogram was performed which demonstrated patency throughout with arterial supply to the descending and sigmoid colon. There was no obvious extravasation.  A 2.4 French Progreat microcatheter and fathom 16 microwire were then used to select the left colic artery middle branch. Dedicated angiogram was performed at this location which demonstrated no evidence of active extravasation in the descending colon. A more distal, descending branch of the left colic artery was selected and repeat angiogram was performed. Again, no evidence of active extravasation was seen at this site. The catheter was redirected to a superior left colic branch and repeat angiogram was performed at this location. Again, no evidence of active extravasation was visualized. Microcatheter was then retracted to the proximal inferior mesenteric artery and repeat angiogram was performed which demonstrated no evidence of active extravasation. The catheters were removed. The 5 French sheath was exchanged for 6 French Angio-Seal device which was deployed successfully without complication. Peripheral pulses were unchanged. A sterile bandage was applied. The patient tolerated the procedure well was transferred back to the ED in good condition. IMPRESSION: Technically successful inferior mesenteric angiogram with selective angiography of the left colic artery branches without evidence of active extravasation. No embolization was performed. Ester Sides, MD [facility] Electronically Signed   By: Ester Sides M.D.   On: 05/16/2024 11:17   CT Angio Abd/Pel W and/or  Wo Contrast Result Date: 05/15/2024 EXAM: CTA ABDOMEN AND PELVIS WITH AND WITHOUT CONTRAST 05/15/2024 10:47:14 PM TECHNIQUE: CTA images of the abdomen and pelvis without and with intravenous contrast. Three-dimensional MIP/volume rendered formations were performed. Automated exposure control, iterative reconstruction, and/or weight based adjustment of the mA/kV was utilized to reduce the radiation dose to as low as reasonably achievable. COMPARISON: Comparison with abdominal radiograph  05/14/2024 and CT abdomen and pelvis 04/01/2024. CLINICAL HISTORY: Lower GI bleed. FINDINGS: VASCULATURE: AORTA: Minimal scattered aortic calcification. No acute finding. No abdominal aortic aneurysm. No dissection. CELIAC TRUNK: No acute finding. No occlusion or significant stenosis. SUPERIOR MESENTERIC ARTERY: No acute finding. No occlusion or significant stenosis. RENAL ARTERIES: No acute finding. No occlusion or significant stenosis. ILIAC ARTERIES: No acute finding. No occlusion or significant stenosis. LIVER: The liver is unremarkable. GALLBLADDER AND BILE DUCTS: Gallbladder is unremarkable. No biliary ductal dilatation. SPLEEN: The spleen is unremarkable. PANCREAS: The pancreas is unremarkable. ADRENAL GLANDS: Bilateral adrenal glands demonstrate no acute abnormality. KIDNEYS, URETERS AND BLADDER: No stones in the kidneys or ureters. No hydronephrosis. No perinephric or periureteral stranding. Urinary bladder is unremarkable. GI AND BOWEL: Small periumbilical hernia with broad base containing small bowel and colon but without proximal obstruction. Diffusely stool-filled colon. No small or large bowel distention. Colonic diverticula without evidence of acute diverticulitis. In the mid descending colon, there is a focal area of intraluminal contrast extravasation on the arterial phase with progressive pooling on the delayed phase. No corresponding density is demonstrated on the unenhanced images. This is consistent with a focal site of acute gastrointestinal hemorrhage. There is a diverticulum adjacent to the area of hemorrhage, suggesting that this is most likely to represent diverticular bleed , less likely due to an occult mass. REPRODUCTIVE: The uterus is surgically absent. PERITONEUM AND RETRPERITONEUM: No ascites or free air. LUNG BASE: Lung bases are clear. LYMPH NODES: No lymphadenopathy. BONES AND SOFT TISSUES: Degenerative changes in the spine. Scarring along the midline consistent with postoperative  change. No acute abnormality of the bones. No acute soft tissue abnormality. Critical results/urgent findings were called at 10:56 pm on 05/15/2024 by radiologist W. Okey Gravely, MD to Dr. Jerilynn Later. IMPRESSION: 1. Focal site of acute gastrointestinal hemorrhage in the mid descending colon, with intraluminal contrast extravasation on arterial phase and progressive pooling on delayed phase. Etiology is indeterminate but most likely to represent diverticular bleed. 2. Small periumbilical hernia containing small bowel and colon without proximal obstruction. Electronically signed by: Elsie Gravely MD 05/15/2024 11:02 PM EST RP Workstation: [id number]    Labs:  CBC: Recent Labs    05/16/24 0519 05/16/24 1309 05/16/24 1830 05/17/24 0351 05/17/24 0656 05/18/24 0516 05/18/24 1304 05/19/24 0538  WBC 5.0  --  8.6  --   --  6.8  --  5.4  HGB 8.0*   < > 7.9*   < > 9.8* 7.4* 8.2* 7.6*  HCT 26.5*   < > 25.1*   < > 30.1* 23.7* 25.6* 24.4*  PLT 231  --  202  --   --  171  --  189   < > = values in this interval not displayed.    COAGS: Recent Labs    05/16/24 0519  INR 1.1    BMP: Recent Labs    05/15/24 0927 05/15/24 2105 05/16/24 0519 05/18/24 0516  NA 139 140 140 140  K 4.1 4.4 4.0 3.6  CL 102 103 106 107  CO2 28 26 27 27   GLUCOSE 100* 119* 111*  101*  BUN 14 16 16  7*  CALCIUM  9.3 9.1 8.2* 8.0*  CREATININE 0.67 0.80 0.59 0.57  GFRNONAA >60 >60 >60 >60    LIVER FUNCTION TESTS: Recent Labs    03/31/24 2338 04/04/24 0212 05/15/24 0927 05/15/24 2105  BILITOT 0.5 0.5 0.5 0.4  AST 24 23 23 24   ALT 18 15 15 14   ALKPHOS 71 48 73 81  PROT 6.5 5.5* 7.1 6.7  ALBUMIN 4.2 3.6 4.4 4.1    Assessment and Plan: Acute lower gastrointestinal hemorrhage s/p provocative angiogram and embolization of distal branches of the left colic artery with cessation of bleeding with Dr Karalee on 05/16/2024-   VSS Hgb again variable but within her recent range. No acute abdominal pain  or ongoing evidence of bleeding.  She is awaiting return of bowel function; passing gas but no stool.  Vascular US  completed and shows no evidence of pseudoaneurysm or hematoma.  Physical reassuring without discrete firmness, thighs symmetric, distal pulses intact.  She has been OOB with tolerance.  Expect ongoing improvement with resolve of tenderness in the coming days.     Electronically Signed: Traniece Boffa Sue-Ellen Alexes Lamarque, PA 05/19/2024, 1:53 PM   I spent a total of 15 Minutes at the the patient's bedside AND on the patient's hospital floor or unit, greater than 50% of which was counseling/coordinating care for GI bleed.

## 2024-05-19 NOTE — Progress Notes (Signed)
 PROGRESS NOTE    Brittany Middleton  FMW:[id number] DOB: Oct 26, 1950 DOA: 05/15/2024 PCP: Dyane Anthony RAMAN, FNP     Brief Narrative:  Brittany Middleton is a 73 y.o. female with medical history significant for adenocarcinoma of the right fallopian tube, abdominal carcinomatosis, HTN, HLD, mild intermittent asthma, hepatic steatosis, OSA on CPAP, obesity, anemia, GI bleed and a recent hospitalization for lower GI bleed now presented with rectal bleeding. Patient reports during her hospitalization in October, an EGD and colonoscopy did not find the source of her bleeding. She had follow-up with Dr. Burnette on November 13 and a capsule endoscopy was performed, currently pending results.  She presented again with rectal bleeding, had numerous hematochezia episodes in the emergency department along with lightheadedness, diaphoresis, nausea and vomiting.  In the emergency department, patient was found to have hemoglobin 7.0, positive FOBT, iron  deficient. CTA abdomen and pelvis shows focal site of acute GI hemorrhage in the mid descending colon concerning for diverticular bleed. Interventional radiology and GI were consulted for evaluation.  PCCM also consulted.  Patient underwent IR evaluation, no embolization performed as no active extravasation visualized.  Due to continued significant blood per rectum, repeat CT angio was performed and patient ultimately underwent IR evaluation for embolization 11/26.  New events last 24 hours / Subjective: Still not has had a bowel movement since 11/26.  Feeling well overall.  Continues to have squeezing pain in her right groin since the procedure.  Assessment & Plan:  Principal Problem:   GI bleed Active Problems:   Chronic asthma   OSA on CPAP   Essential hypertension   Diverticular hemorrhage   Obesity, Class II, BMI 35-39.9   History of ovarian cancer   History of fallopian tube cancer   Lower GI bleed Symptomatic anemia Iron  deficiency anemia -  Appreciate GI, IR - Status post embolization of distal branch of left colic artery by Dr. Karalee 11/26 - Status post total 5 unit packed red blood cell transfusion since admission - Status post IV iron  infusion - PPI - Miralax  twice daily - Advance diet today  Question pseudoaneurysm post procedure - No evidence of pseudoaneurysm, AVF or DVT on duplex   Hypertension - Losartan    CAD -Discussed with Dr. Burnette.  Resume baby aspirin .  Hold Plavix  for another week  Hyperlipidemia - Lipitor, Zetia   OSA - CPAP nightly  History of GYN cancer - Follow-up with Dr. Lonn  DVT prophylaxis:  SCDs Start: 05/16/24 0131  Code Status: Full code Family Communication: Son at bedside Disposition Plan: Home Status is: Inpatient Remains inpatient appropriate because: Advance diet today.  Hopeful for bowel movement later on.  Planning for discharge 11/30.    Antimicrobials:  Anti-infectives (From admission, onward)    None        Objective: Vitals:   05/18/24 1438 05/18/24 2058 05/18/24 2159 05/19/24 0614  BP: (!) 142/61  111/60 (!) 144/69  Pulse: 68 71 66 67  Resp: 16 17 18 17   Temp: 98 F (36.7 C)  98.3 F (36.8 C) 98.6 F (37 C)  TempSrc: Oral  Oral Oral  SpO2: 99% 98% 97% 95%  Weight:      Height:        Intake/Output Summary (Last 24 hours) at 05/19/2024 1310 Last data filed at 05/18/2024 1844 Gross per 24 hour  Intake 240 ml  Output --  Net 240 ml   Filed Weights   05/15/24 2050 05/16/24 0254  Weight: 104.3 kg 108.9 kg  Examination:  General exam: Appears calm and comfortable  Respiratory system: Clear to auscultation. Respiratory effort normal. No respiratory distress. No conversational dyspnea.  Cardiovascular system: S1 & S2 heard, RRR. No murmurs. No pedal edema. Gastrointestinal system: Abdomen is nondistended, soft and nontender. Normal bowel sounds heard. Central nervous system: Alert and oriented. No focal neurological deficits. Speech  clear.  Extremities: Symmetric in appearance  Skin: No rashes, lesions or ulcers on exposed skin  Psychiatry: Judgement and insight appear normal. Mood & affect appropriate.   Data Reviewed: I have personally reviewed following labs and imaging studies  CBC: Recent Labs  Lab 05/15/24 0927 05/15/24 2105 05/15/24 2300 05/16/24 0519 05/16/24 1309 05/16/24 1830 05/17/24 0351 05/17/24 0656 05/18/24 0516 05/18/24 1304 05/19/24 0538  WBC 3.5*   < > 4.5 5.0  --  8.6  --   --  6.8  --  5.4  NEUTROABS 2.4  --   --   --   --   --   --   --   --   --   --   HGB 9.6*   < > 7.0* 8.0*   < > 7.9* 9.8* 9.8* 7.4* 8.2* 7.6*  HCT 32.2*   < > 23.6* 26.5*   < > 25.1* 30.3* 30.1* 23.7* 25.6* 24.4*  MCV 85.2   < > 88.1 87.2  --  85.7  --   --  85.9  --  88.7  PLT 297   < > 234 231  --  202  --   --  171  --  189   < > = values in this interval not displayed.   Basic Metabolic Panel: Recent Labs  Lab 05/15/24 0927 05/15/24 2105 05/16/24 0519 05/18/24 0516  NA 139 140 140 140  K 4.1 4.4 4.0 3.6  CL 102 103 106 107  CO2 28 26 27 27   GLUCOSE 100* 119* 111* 101*  BUN 14 16 16  7*  CREATININE 0.67 0.80 0.59 0.57  CALCIUM  9.3 9.1 8.2* 8.0*   GFR: Estimated Creatinine Clearance: 77.8 mL/min (by C-G formula based on SCr of 0.57 mg/dL). Liver Function Tests: Recent Labs  Lab 05/15/24 0927 05/15/24 2105  AST 23 24  ALT 15 14  ALKPHOS 73 81  BILITOT 0.5 0.4  PROT 7.1 6.7  ALBUMIN 4.4 4.1   No results for input(s): LIPASE, AMYLASE in the last 168 hours. No results for input(s): AMMONIA in the last 168 hours. Coagulation Profile: Recent Labs  Lab 05/16/24 0519  INR 1.1   Cardiac Enzymes: No results for input(s): CKTOTAL, CKMB, CKMBINDEX, TROPONINI in the last 168 hours. BNP (last 3 results) No results for input(s): PROBNP in the last 8760 hours. HbA1C: No results for input(s): HGBA1C in the last 72 hours. CBG: No results for input(s): GLUCAP in the last 168  hours. Lipid Profile: No results for input(s): CHOL, HDL, LDLCALC, TRIG, CHOLHDL, LDLDIRECT in the last 72 hours. Thyroid  Function Tests: No results for input(s): TSH, T4TOTAL, FREET4, T3FREE, THYROIDAB in the last 72 hours.  Anemia Panel: No results for input(s): VITAMINB12, FOLATE, FERRITIN, TIBC, IRON , RETICCTPCT in the last 72 hours.  Sepsis Labs: No results for input(s): PROCALCITON, LATICACIDVEN in the last 168 hours.  Recent Results (from the past 240 hours)  MRSA Next Gen by PCR, Nasal     Status: None   Collection Time: 05/16/24  4:18 PM   Specimen: Nasal Mucosa; Nasal Swab  Result Value Ref Range Status   MRSA by PCR  Next Gen NOT DETECTED NOT DETECTED Final    Comment: (NOTE) The GeneXpert MRSA Assay (FDA approved for NASAL specimens only), is one component of a comprehensive MRSA colonization surveillance program. It is not intended to diagnose MRSA infection nor to guide or monitor treatment for MRSA infections. Test performance is not FDA approved in patients less than 73 years old. Performed at [facility], [address], [place], [place] [address]       Radiology Studies: VAS US  GROIN PSEUDOANEURYSM Result Date: 05/19/2024  ARTERIAL PSEUDOANEURYSM  Patient Name:  JOCELYNE ANN Gaba  Date of Exam:   05/19/2024 Medical Rec #: [id number]            Accession #:    [id number] Date of Birth: [birth year]            Patient Gender: F Patient Age:   12 years Exam Location:  [facility] Procedure:      VAS US  QUILLIAN CAPES Referring Phys: DELON HOE --------------------------------------------------------------------------------  Exam: Right groin Indications: Patient complains of groin pain and palpable knot the size of a pea.. History: Acute lower gastrointestinal hemorrhage s/p provocative angiogram and embolization of distal branches of the left colic artery with cessation of bleeding with Dr  Karalee on 05/16/2024. Limitations: Tissue properties. Body habitus. Comparison Study: No prior study on file Performing Technologist: Alberta Lis RVS  Examination Guidelines: A complete evaluation includes B-mode imaging, spectral Doppler, color Doppler, and power Doppler as needed of all accessible portions of each vessel. Bilateral testing is considered an integral part of a complete examination. Limited examinations for reoccurring indications may be performed as noted.  Summary: No evidence of pseudoaneurysm, AVF or DVT    --------------------------------------------------------------------------------    Preliminary       Scheduled Meds:  aspirin  EC  81 mg Oral Daily   atorvastatin   40 mg Oral Daily   Chlorhexidine  Gluconate Cloth  6 each Topical Daily   ezetimibe   10 mg Oral Daily   losartan   50 mg Oral Daily   pantoprazole   40 mg Oral BID AC   polyethylene glycol  17 g Oral BID   Continuous Infusions:   LOS: 3 days   Time spent: 25 minutes   Delon Hoe, DO [facility] 05/19/2024, 1:10 PM   Available via Epic secure chat 7am-7pm After these hours, please refer to coverage provider listed on [email or web address]

## 2024-05-20 DIAGNOSIS — K922 Gastrointestinal hemorrhage, unspecified: Secondary | ICD-10-CM | POA: Diagnosis not present

## 2024-05-20 LAB — CBC
HCT: 24.6 % — ABNORMAL LOW (ref 36.0–46.0)
Hemoglobin: 7.4 g/dL — ABNORMAL LOW (ref 12.0–15.0)
MCH: 26.8 pg (ref 26.0–34.0)
MCHC: 30.1 g/dL (ref 30.0–36.0)
MCV: 89.1 fL (ref 80.0–100.0)
Platelets: 198 K/uL (ref 150–400)
RBC: 2.76 MIL/uL — ABNORMAL LOW (ref 3.87–5.11)
RDW: 20.4 % — ABNORMAL HIGH (ref 11.5–15.5)
WBC: 4.8 K/uL (ref 4.0–10.5)
nRBC: 0 % (ref 0.0–0.2)

## 2024-05-20 NOTE — TOC Transition Note (Signed)
 Transition of Care [facility]) - Discharge Note   Patient Details  Name: Brittany Middleton MRN: [id number] Date of Birth: 12-16-1950  Transition of Care [facility]) CM/SW Contact:  Sonda Manuella Quill, RN Phone Number: 05/20/2024, 9:28 AM   Clinical Narrative:    D/C orders received; HHPT/OT set up w/ [facility]; Cory at agency notified; no IP CM needs.   Final next level of care: Home w Home Health Services Barriers to Discharge: Continued Medical Work up   Patient Goals and CMS Choice Patient states their goals for this hospitalization and ongoing recovery are:: home CMS Medicare.gov Compare Post Acute Care list provided to:: Patient   [facility] ownership interest in [facility].provided to:: Patient    Discharge Placement                       Discharge Plan and Services Additional resources added to the After Visit Summary for     Discharge Planning Services: CM Consult Post Acute Care Choice: Home Health          DME Arranged: N/A DME Agency: NA       HH Arranged: PT HH Agency: [facility] Date [facility] Agency Contacted: 05/19/24 Time HH Agency Contacted: 1310 Representative spoke with at [facility] Agency: Darleene  Social Drivers of Health (SDOH) Interventions SDOH Screenings   Food Insecurity: No Food Insecurity (05/16/2024)  Housing: Low Risk  (05/16/2024)  Transportation Needs: No Transportation Needs (05/16/2024)  Utilities: Not At Risk (05/16/2024)  Financial Resource Strain: Low Risk  (02/27/2018)  Physical Activity: Sufficiently Active (02/27/2018)  Social Connections: Moderately Isolated (05/16/2024)  Stress: No Stress Concern Present (02/27/2018)  Tobacco Use: Low Risk  (05/16/2024)     Readmission Risk Interventions    05/19/2024   12:47 PM 04/03/2024    2:11 PM  Readmission Risk Prevention Plan  Post Dischage Appt  Complete  Medication Screening  Complete  Transportation Screening Complete Complete  PCP or Specialist Appt within 5-7  Days Complete   Home Care Screening Complete   Medication Review (RN CM) Complete

## 2024-05-20 NOTE — Progress Notes (Signed)
 Discharge instructions gone over with [facility]. Next medications due written on her discharge papers. Nathanel took a shower and is ambulating well without assist. Denies any pain. Her son is picking her up to take home. She was discharged via wheelchair.

## 2024-05-20 NOTE — Plan of Care (Signed)
 Problem: Education: Goal: Knowledge of General Education information will improve Description: Including pain rating scale, medication(s)/side effects and non-pharmacologic comfort measures Outcome: Adequate for Discharge   Problem: Health Behavior/Discharge Planning: Goal: Ability to manage health-related needs will improve Outcome: Adequate for Discharge   Problem: Clinical Measurements: Goal: Ability to maintain clinical measurements within normal limits will improve Outcome: Adequate for Discharge Goal: Will remain free from infection Outcome: Adequate for Discharge Goal: Diagnostic test results will improve Outcome: Adequate for Discharge Goal: Respiratory complications will improve Outcome: Adequate for Discharge Goal: Cardiovascular complication will be avoided Outcome: Adequate for Discharge   Problem: Activity: Goal: Risk for activity intolerance will decrease Outcome: Adequate for Discharge   Problem: Nutrition: Goal: Adequate nutrition will be maintained Outcome: Adequate for Discharge   Problem: Coping: Goal: Level of anxiety will decrease Outcome: Adequate for Discharge   Problem: Elimination: Goal: Will not experience complications related to bowel motility Outcome: Adequate for Discharge Goal: Will not experience complications related to urinary retention Outcome: Adequate for Discharge   Problem: Pain Managment: Goal: General experience of comfort will improve and/or be controlled Outcome: Adequate for Discharge   Problem: Safety: Goal: Ability to remain free from injury will improve Outcome: Adequate for Discharge   Problem: Skin Integrity: Goal: Risk for impaired skin integrity will decrease Outcome: Adequate for Discharge   Problem: Activity: Goal: Ability to return to baseline activity level will improve Outcome: Adequate for Discharge   Problem: Cardiovascular: Goal: Ability to achieve and maintain adequate cardiovascular perfusion will  improve Outcome: Adequate for Discharge   Problem: Health Behavior/Discharge Planning: Goal: Ability to safely manage health-related needs after discharge will improve Outcome: Adequate for Discharge   Problem: Education: Goal: Knowledge of General Education information will improve Description: Including pain rating scale, medication(s)/side effects and non-pharmacologic comfort measures Outcome: Adequate for Discharge   Problem: Health Behavior/Discharge Planning: Goal: Ability to manage health-related needs will improve Outcome: Adequate for Discharge   Problem: Clinical Measurements: Goal: Ability to maintain clinical measurements within normal limits will improve Outcome: Adequate for Discharge Goal: Will remain free from infection Outcome: Adequate for Discharge Goal: Diagnostic test results will improve Outcome: Adequate for Discharge Goal: Respiratory complications will improve Outcome: Adequate for Discharge Goal: Cardiovascular complication will be avoided Outcome: Adequate for Discharge   Problem: Activity: Goal: Risk for activity intolerance will decrease Outcome: Adequate for Discharge   Problem: Nutrition: Goal: Adequate nutrition will be maintained Outcome: Adequate for Discharge   Problem: Coping: Goal: Level of anxiety will decrease Outcome: Adequate for Discharge   Problem: Elimination: Goal: Will not experience complications related to bowel motility Outcome: Adequate for Discharge Goal: Will not experience complications related to urinary retention Outcome: Adequate for Discharge   Problem: Pain Managment: Goal: General experience of comfort will improve and/or be controlled Outcome: Adequate for Discharge   Problem: Safety: Goal: Ability to remain free from injury will improve Outcome: Adequate for Discharge   Problem: Skin Integrity: Goal: Risk for impaired skin integrity will decrease Outcome: Adequate for Discharge   Problem:  Activity: Goal: Ability to return to baseline activity level will improve Outcome: Adequate for Discharge   Problem: Cardiovascular: Goal: Ability to achieve and maintain adequate cardiovascular perfusion will improve Outcome: Adequate for Discharge   Problem: Health Behavior/Discharge Planning: Goal: Ability to safely manage health-related needs after discharge will improve Outcome: Adequate for Discharge   Problem: Education: Goal: Knowledge of General Education information will improve Description: Including pain rating scale, medication(s)/side effects and non-pharmacologic comfort measures Outcome:  Adequate for Discharge   Problem: Health Behavior/Discharge Planning: Goal: Ability to manage health-related needs will improve Outcome: Adequate for Discharge   Problem: Clinical Measurements: Goal: Ability to maintain clinical measurements within normal limits will improve Outcome: Adequate for Discharge Goal: Will remain free from infection Outcome: Adequate for Discharge Goal: Diagnostic test results will improve Outcome: Adequate for Discharge Goal: Respiratory complications will improve Outcome: Adequate for Discharge Goal: Cardiovascular complication will be avoided Outcome: Adequate for Discharge   Problem: Activity: Goal: Risk for activity intolerance will decrease Outcome: Adequate for Discharge   Problem: Nutrition: Goal: Adequate nutrition will be maintained Outcome: Adequate for Discharge   Problem: Coping: Goal: Level of anxiety will decrease Outcome: Adequate for Discharge   Problem: Elimination: Goal: Will not experience complications related to bowel motility Outcome: Adequate for Discharge Goal: Will not experience complications related to urinary retention Outcome: Adequate for Discharge   Problem: Pain Managment: Goal: General experience of comfort will improve and/or be controlled Outcome: Adequate for Discharge   Problem: Safety: Goal:  Ability to remain free from injury will improve Outcome: Adequate for Discharge   Problem: Skin Integrity: Goal: Risk for impaired skin integrity will decrease Outcome: Adequate for Discharge   Problem: Activity: Goal: Ability to return to baseline activity level will improve Outcome: Adequate for Discharge   Problem: Cardiovascular: Goal: Ability to achieve and maintain adequate cardiovascular perfusion will improve Outcome: Adequate for Discharge   Problem: Health Behavior/Discharge Planning: Goal: Ability to safely manage health-related needs after discharge will improve Outcome: Adequate for Discharge   Problem: Education: Goal: Knowledge of General Education information will improve Description: Including pain rating scale, medication(s)/side effects and non-pharmacologic comfort measures Outcome: Adequate for Discharge   Problem: Health Behavior/Discharge Planning: Goal: Ability to manage health-related needs will improve Outcome: Adequate for Discharge   Problem: Clinical Measurements: Goal: Ability to maintain clinical measurements within normal limits will improve Outcome: Adequate for Discharge Goal: Will remain free from infection Outcome: Adequate for Discharge Goal: Diagnostic test results will improve Outcome: Adequate for Discharge Goal: Respiratory complications will improve Outcome: Adequate for Discharge Goal: Cardiovascular complication will be avoided Outcome: Adequate for Discharge   Problem: Activity: Goal: Risk for activity intolerance will decrease Outcome: Adequate for Discharge   Problem: Nutrition: Goal: Adequate nutrition will be maintained Outcome: Adequate for Discharge   Problem: Coping: Goal: Level of anxiety will decrease Outcome: Adequate for Discharge   Problem: Elimination: Goal: Will not experience complications related to bowel motility Outcome: Adequate for Discharge Goal: Will not experience complications related to  urinary retention Outcome: Adequate for Discharge   Problem: Pain Managment: Goal: General experience of comfort will improve and/or be controlled Outcome: Adequate for Discharge   Problem: Safety: Goal: Ability to remain free from injury will improve Outcome: Adequate for Discharge   Problem: Skin Integrity: Goal: Risk for impaired skin integrity will decrease Outcome: Adequate for Discharge   Problem: Activity: Goal: Ability to return to baseline activity level will improve Outcome: Adequate for Discharge   Problem: Cardiovascular: Goal: Ability to achieve and maintain adequate cardiovascular perfusion will improve Outcome: Adequate for Discharge   Problem: Health Behavior/Discharge Planning: Goal: Ability to safely manage health-related needs after discharge will improve Outcome: Adequate for Discharge   Problem: Education: Goal: Knowledge of General Education information will improve Description: Including pain rating scale, medication(s)/side effects and non-pharmacologic comfort measures Outcome: Adequate for Discharge   Problem: Health Behavior/Discharge Planning: Goal: Ability to manage health-related needs will improve Outcome: Adequate for Discharge  Problem: Clinical Measurements: Goal: Ability to maintain clinical measurements within normal limits will improve Outcome: Adequate for Discharge Goal: Will remain free from infection Outcome: Adequate for Discharge Goal: Diagnostic test results will improve Outcome: Adequate for Discharge Goal: Respiratory complications will improve Outcome: Adequate for Discharge Goal: Cardiovascular complication will be avoided Outcome: Adequate for Discharge   Problem: Activity: Goal: Risk for activity intolerance will decrease Outcome: Adequate for Discharge   Problem: Nutrition: Goal: Adequate nutrition will be maintained Outcome: Adequate for Discharge   Problem: Coping: Goal: Level of anxiety will  decrease Outcome: Adequate for Discharge   Problem: Elimination: Goal: Will not experience complications related to bowel motility Outcome: Adequate for Discharge Goal: Will not experience complications related to urinary retention Outcome: Adequate for Discharge   Problem: Pain Managment: Goal: General experience of comfort will improve and/or be controlled Outcome: Adequate for Discharge   Problem: Safety: Goal: Ability to remain free from injury will improve Outcome: Adequate for Discharge   Problem: Skin Integrity: Goal: Risk for impaired skin integrity will decrease Outcome: Adequate for Discharge   Problem: Activity: Goal: Ability to return to baseline activity level will improve Outcome: Adequate for Discharge   Problem: Cardiovascular: Goal: Ability to achieve and maintain adequate cardiovascular perfusion will improve Outcome: Adequate for Discharge   Problem: Health Behavior/Discharge Planning: Goal: Ability to safely manage health-related needs after discharge will improve Outcome: Adequate for Discharge

## 2024-05-20 NOTE — Discharge Summary (Signed)
 Physician Discharge Summary  Brittany Middleton FMW:[id number] DOB: [birth year] DOA: 05/15/2024  PCP: Dyane Anthony RAMAN, FNP  Admit date: 05/15/2024 Discharge date: 05/20/2024  Admitted From: Home Disposition:  Home  Recommendations for Outpatient Follow-up:  Follow up with PCP Follow up with GI Dr. Burnette Follow up with Onc Dr. Lonn Resume aspirin . Hold plavix  for another week.  Recommend outpatient CBC in 1 week to follow Hgb   Discharge Condition: Stable CODE STATUS: Full  Diet recommendation: Soft diet   Brief/Interim Summary: Brittany Middleton is a 73 y.o. female with medical history significant for adenocarcinoma of the right fallopian tube, abdominal carcinomatosis, HTN, HLD, mild intermittent asthma, hepatic steatosis, OSA on CPAP, obesity, anemia, GI bleed and a recent hospitalization for lower GI bleed now presented with rectal bleeding. Patient reports during her hospitalization in October, an EGD and colonoscopy did not find the source of her bleeding. She had follow-up with Dr. Burnette on November 13 and a capsule endoscopy was performed, currently pending results.  She presented again with rectal bleeding, had numerous hematochezia episodes in the emergency department along with lightheadedness, diaphoresis, nausea and vomiting.  In the emergency department, patient was found to have hemoglobin 7.0, positive FOBT, iron  deficient. CTA abdomen and pelvis shows focal site of acute GI hemorrhage in the mid descending colon concerning for diverticular bleed. Interventional radiology and GI were consulted for evaluation.  PCCM also consulted.  Patient underwent IR evaluation, no embolization performed as no active extravasation visualized.   Due to continued significant blood per rectum, repeat CT angio was performed and patient ultimately underwent IR evaluation for embolization 11/26.  Discharge Diagnoses:   Principal Problem:   GI bleed Active Problems:   Chronic asthma    OSA on CPAP   Essential hypertension   Diverticular hemorrhage   Obesity, Class II, BMI 35-39.9   History of ovarian cancer   History of fallopian tube cancer   Lower GI bleed Symptomatic anemia Iron  deficiency anemia - Appreciate GI, IR - Status post embolization of distal branch of left colic artery by Dr. Karalee 11/26 - Status post total 5 unit packed red blood cell transfusion since admission - Status post IV iron  infusion - PPI - Miralax  twice daily   Question pseudoaneurysm post procedure - No evidence of pseudoaneurysm, AVF or DVT on duplex    Hypertension - Losartan     CAD -Discussed with Dr. Burnette.  Resume baby aspirin .  Hold Plavix  for another week   Hyperlipidemia - Lipitor, Zetia    OSA - CPAP nightly   History of GYN cancer - Follow-up with Dr. Lonn  Discharge Instructions  Discharge Instructions     Call MD for:   Complete by: As directed    GI bleeding   Call MD for:  difficulty breathing, headache or visual disturbances   Complete by: As directed    Call MD for:  extreme fatigue   Complete by: As directed    Call MD for:  persistant dizziness or light-headedness   Complete by: As directed    Call MD for:  persistant nausea and vomiting   Complete by: As directed    Call MD for:  severe uncontrolled pain   Complete by: As directed    Call MD for:  temperature >100.4   Complete by: As directed    Discharge instructions   Complete by: As directed    You were cared for by a hospitalist during your hospital stay. If you have any questions about  your discharge medications or the care you received while you were in the hospital after you are discharged, you can call the unit and ask to speak with the hospitalist on call if the hospitalist that took care of you is not available. Once you are discharged, your primary care physician will handle any further medical issues. Please note that NO REFILLS for any discharge medications will be authorized  once you are discharged, as it is imperative that you return to your primary care physician (or establish a relationship with a primary care physician if you do not have one) for your aftercare needs so that they can reassess your need for medications and monitor your lab values.   Increase activity slowly   Complete by: As directed    No wound care   Complete by: As directed       Allergies as of 05/20/2024       Reactions   Macrobid  [nitrofurantoin ] Shortness Of Breath, Cough   myalgia   Ciprofloxacin Other (See Comments)   Caused PAIN IN HANDS    Codeine Nausea And Vomiting   Azithromycin Palpitations        Medication List     PAUSE taking these medications    clopidogrel  75 MG tablet Wait to take this until: May 27, 2024 Commonly known as: PLAVIX  Take 75 mg by mouth daily.       TAKE these medications    acetaminophen  500 MG tablet Commonly known as: TYLENOL  Take 500-1,000 mg by mouth See admin instructions. Take 1000 mg by mouth in the morning and 500 mg in the evening   albuterol  108 (90 Base) MCG/ACT inhaler Commonly known as: VENTOLIN  HFA Inhale 2 puffs into the lungs every 6 (six) hours as needed for wheezing or shortness of breath.   aspirin  EC 81 MG tablet Take 81 mg by mouth daily. Swallow whole.   atorvastatin  40 MG tablet Commonly known as: LIPITOR Take 1 tablet (40 mg total) by mouth daily.   BENEFIBER PO Take 15 mLs by mouth in the morning and at bedtime.   budesonide -formoterol  80-4.5 MCG/ACT inhaler Commonly known as: Breyna  Inhale 2 puffs into the lungs 2 (two) times daily as needed. What changed: reasons to take this   cholecalciferol  25 MCG (1000 UNIT) tablet Commonly known as: VITAMIN D3 Take 1,000 Units by mouth daily.   co-enzyme Q-10 30 MG capsule Take 30 mg by mouth daily.   COLLAGEN PO Take 1 Scoop by mouth daily.   estradiol  0.1 MG/GM vaginal cream Commonly known as: ESTRACE  VAGINAL Place 1 Applicatorful vaginally  at bedtime. What changed: when to take this   ezetimibe  10 MG tablet Commonly known as: ZETIA  Take 10 mg by mouth daily.   hydrocortisone 2.5 % cream Apply 1 Application topically daily as needed.   loratadine  10 MG tablet Commonly known as: CLARITIN  Take 10 mg by mouth daily.   losartan  50 MG tablet Commonly known as: COZAAR  Take 50 mg by mouth daily.   Melatonin 5 MG Chew Chew 20 mg by mouth at bedtime.   metFORMIN 500 MG tablet Commonly known as: GLUCOPHAGE Take 500 mg by mouth 2 (two) times daily.   Omega 3 1000 MG Caps Take 1 capsule by mouth daily.   omeprazole  40 MG capsule Commonly known as: PRILOSEC Take 40 mg by mouth daily.   Ozempic (0.25 or 0.5 MG/DOSE) 2 MG/3ML Sopn Generic drug: Semaglutide(0.25 or 0.5MG /DOS) Inject 0.5 mg into the skin once a week.  polyethylene glycol 17 g packet Commonly known as: MIRALAX  / GLYCOLAX  Take 17 g by mouth daily.   PROBIOTIC BLEND PO Take 1 capsule by mouth daily.   UNABLE TO FIND Take 1 capsule by mouth in the morning and at bedtime. Med Name: Turkey Tail Mushroom        Contact information for follow-up providers     [facility] Follow up.   Specialty: Home Health Services Contact information: [address] [place] [address] [phone]         Dyane Anthony RAMAN, FNP Follow up.   Specialty: Family Medicine Contact information: [address] [place] [address] [phone]         Burnette Fallow, MD Follow up.   Specialty: Gastroenterology Contact information: [address] [place] [address] [phone]         [name], MD Follow up.   Specialty: Hematology and Oncology Contact information: [address] [place] [address] [phone]              Contact information for after-discharge care     Home Medical Care     [facility]) .   Service:  Home Health Services Contact information: [address] [place]  [address] [phone]                    Allergies  Allergen Reactions   Macrobid  [Nitrofurantoin ] Shortness Of Breath and Cough    myalgia   Ciprofloxacin Other (See Comments)    Caused PAIN IN HANDS    Codeine Nausea And Vomiting   Azithromycin Palpitations    Consultations: PCCM GI IR    Procedures/Studies: VAS US  GROIN PSEUDOANEURYSM Result Date: 05/20/2024  ARTERIAL PSEUDOANEURYSM  Patient Name:  JALEIAH ANN Pernice  Date of Exam:   05/19/2024 Medical Rec #: [id number]            Accession #:    [id number] Date of Birth: 1950/09/29            Patient Gender: F Patient Age:   89 years Exam Location[facility]  [facility] Procedure:      VAS US  QUILLIAN CAPES Referring Phys: DELON HOE --------------------------------------------------------------------------------  Exam: Right groin Indications: Patient complains of groin pain and palpable knot the size of a pea.. History: Acute lower gastrointestinal hemorrhage s/p provocative angiogram and embolization of distal branches of the left colic artery with cessation of bleeding with Dr Karalee on 05/16/2024. Limitations: Tissue properties. Body habitus. Comparison Study: No prior study on file Performing Technologist: Alberta Lis RVS  Examination Guidelines: A complete evaluation includes B-mode imaging, spectral Doppler, color Doppler, and power Doppler as needed of all accessible portions of each vessel. Bilateral testing is considered an integral part of a complete examination. Limited examinations for reoccurring indications may be performed as noted.  Summary: No evidence of pseudoaneurysm, AVF or DVT  Diagnosing physician: Norman Serve Electronically signed by Norman Serve on 05/20/2024 at 8:59:56 AM.   --------------------------------------------------------------------------------    Final    MM 3D SCREENING  MAMMOGRAM BILATERAL BREAST Result Date: 05/17/2024 CLINICAL DATA:  Screening. EXAM: DIGITAL SCREENING BILATERAL MAMMOGRAM WITH TOMOSYNTHESIS AND CAD TECHNIQUE: Bilateral screening digital craniocaudal and mediolateral oblique mammograms were obtained. Bilateral screening digital breast tomosynthesis was performed. The images were evaluated with computer-aided detection. COMPARISON:  Previous exam(s). ACR Breast Density Category b: There are scattered areas of fibroglandular density.  FINDINGS: There are no findings suspicious for malignancy. IMPRESSION: No mammographic evidence of malignancy. A result letter of this screening mammogram will be mailed directly to the patient. RECOMMENDATION: Screening mammogram in one year. (Code:SM-B-01Y) BI-RADS CATEGORY  1: Negative. Electronically Signed   By: Dina  Arceo M.D.   On: 05/17/2024 17:26   IR Angiogram Visceral Selective Result Date: 05/17/2024 INDICATION: 73 year old female with ongoing acute lower GI bleed. CT arteriography was positive late yesterday and patient underwent angiography earlier this morning which was negative for evidence of active bleeding. Patient then did well throughout much of the day before developing recurrent bright red blood per rectum and need for additional transfusion this afternoon. CT arteriography was repeated and confirmed recurrent bleeding in the mid descending colon. Therefore, patient presents for repeat arteriography and embolization. EXAM: SELECTIVE VISCERAL ARTERIOGRAPHY; ADDITIONAL ARTERIOGRAPHY; IR EMBO ART VEN HEMORR LYMPH EXTRAV INC GUIDE ROADMAPPING; IR ULTRASOUND GUIDANCE VASC ACCESS LEFT MEDICATIONS: 5000 units of heparin  was administered intravenously by the Radiology nurse. 4 mg tPA and 200 mcg nitroglycerin  were administered intra-arterially by me. ANESTHESIA/SEDATION: Moderate (conscious) sedation was employed during this procedure. A total of Versed  2 mg and Fentanyl  200 mcg was administered intravenously by the  Radiology nurse. Moderate Sedation Time: 98 minutes. The patient's level of consciousness and vital signs were monitored continuously by radiology nursing throughout the procedure under my direct supervision. CONTRAST:  50mL OMNIPAQUE  IOHEXOL  300 MG/ML SOLN, 40mL OMNIPAQUE  IOHEXOL  300 MG/ML SOLN, 50mL OMNIPAQUE  IOHEXOL  300 MG/ML SOLN FLUOROSCOPY: Radiation Exposure Index (as provided by the fluoroscopic device): 2684 mGy Kerma COMPLICATIONS: SIR Level A - No therapy, no consequence. PROCEDURE: Informed consent was obtained from the patient following explanation of the procedure, risks, benefits and alternatives. The patient understands, agrees and consents for the procedure. All questions were addressed. A time out was performed prior to the initiation of the procedure. Maximal barrier sterile technique utilized including caps, mask, sterile gowns, sterile gloves, large sterile drape, hand hygiene, and Betadine prep. The left common femoral artery was interrogated with ultrasound and found to be widely patent. An image was obtained and stored for the medical record. Local anesthesia was attained by infiltration with 1% lidocaine . A small dermatotomy was made. Under real-time sonographic guidance, the vessel was punctured with a 21 gauge micropuncture needle. Using standard technique, the initial micro needle was exchanged over a 0.018 micro wire for a transitional 4 French micro sheath. The micro sheath was then exchanged over a 0.035 wire for a 5 French vascular sheath. Unfortunately, the department is out of RIM catheters. Therefore a Sos Omni selective catheter was advanced over a Bentson wire into the abdominal aorta and formed. The catheter was used to select the origin of the IMA and arteriography was performed. No active bleeding on the initial angiogram. Given the clearly defined location of bleeding in the mid descending artery, a Cook cantata 2.5 French microcatheter was therefore used to select the left  colic artery and arteriography was again performed. Again, no active bleeding. The microcatheter was further advanced into the descending branch of the left colic artery and arteriography was performed. No evidence of active bleeding. The microcatheter was pulled back and advanced into the ascending branch of the left colic artery and arteriography was performed. No evidence of active bleeding. Anatomic leak, the region of colon most corresponding to the recent visualized bleed on CT arteriography is supplied by the descending branch of the left colic artery. Therefore, the microcatheter was again advanced into the descending  branch of the left colic artery and advanced distally just proximal to its bifurcation into the many Vasa recta branches. At this time, heparin  was administered intravenously. TPA was prepared and 4 mg of tPA was administered through the catheter followed by a total of 200 mcg of nitroglycerin . After several minutes additional arteriography was performed. This time, active bleeding is visualized from 1 of the tortuous distal branches. The microcatheter was used to select several of the small branches in till the active bleeding was successfully visualized. At this time, coil embolization was performed using a series of detachable penumbra microcoils. Follow-up arteriography demonstrates no further active bleeding. The microcatheter was removed. Contrast was injected through the base catheter at the origin of the IMA. Unfortunately, this resulted in a small iatrogenic dissection of the proximal IMA. The catheter was moved and repeat injection performed gingerly. The dissection is not flow limiting. This is likely of no consequence and should heal without any further intervention. IMPRESSION: 1. Successful provocative mesenteric angiogram with recreation and visualization of the site of bleeding. 2. Successful coil embolization of distal branches of the descending branch of the left colic artery  with cessation of bleeding. 3. 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 This is likely of no consequence and should heal without any further intervention. Electronically Signed   By: Wilkie Lent M.D.   On: 05/17/2024 07:29   IR US  Guide Vasc Access Left Result Date: 05/17/2024 INDICATION: 73 year old female with ongoing acute lower GI bleed. CT arteriography was positive late yesterday and patient underwent angiography earlier this morning which was negative for evidence of active bleeding. Patient then did well throughout much of the day before developing recurrent bright red blood per rectum and need for additional transfusion this afternoon. CT arteriography was repeated and confirmed recurrent bleeding in the mid descending colon. Therefore, patient presents for repeat arteriography and embolization. EXAM: SELECTIVE VISCERAL ARTERIOGRAPHY; ADDITIONAL ARTERIOGRAPHY; IR EMBO ART VEN HEMORR LYMPH EXTRAV INC GUIDE ROADMAPPING; IR ULTRASOUND GUIDANCE VASC ACCESS LEFT MEDICATIONS: 5000 units of heparin  was administered intravenously by the Radiology nurse. 4 mg tPA and 200 mcg nitroglycerin  were administered intra-arterially by me. ANESTHESIA/SEDATION: Moderate (conscious) sedation was employed during this procedure. A total of Versed  2 mg and Fentanyl  200 mcg was administered intravenously by the Radiology nurse. Moderate Sedation Time: 98 minutes. The patient's level of consciousness and vital signs were monitored continuously by radiology nursing throughout the procedure under my direct supervision. CONTRAST:  50mL OMNIPAQUE  IOHEXOL  300 MG/ML SOLN, 40mL OMNIPAQUE  IOHEXOL  300 MG/ML SOLN, 50mL OMNIPAQUE  IOHEXOL  300 MG/ML SOLN FLUOROSCOPY: Radiation Exposure Index (as provided by the fluoroscopic device): 2684 mGy Kerma COMPLICATIONS: SIR Level A - No therapy, no consequence. PROCEDURE: Informed consent was obtained from the patient following explanation of the procedure, risks,  benefits and alternatives. The patient understands, agrees and consents for the procedure. All questions were addressed. A time out was performed prior to the initiation of the procedure. Maximal barrier sterile technique utilized including caps, mask, sterile gowns, sterile gloves, large sterile drape, hand hygiene, and Betadine prep. The left common femoral artery was interrogated with ultrasound and found to be widely patent. An image was obtained and stored for the medical record. Local anesthesia was attained by infiltration with 1% lidocaine . A small dermatotomy was made. Under real-time sonographic guidance, the vessel was punctured with a 21 gauge micropuncture needle. Using standard technique, the initial micro needle was exchanged over a 0.018 micro wire for a transitional 4  French micro sheath. The micro sheath was then exchanged over a 0.035 wire for a 5 French vascular sheath. Unfortunately, the department is out of RIM catheters. Therefore a Sos Omni selective catheter was advanced over a Bentson wire into the abdominal aorta and formed. The catheter was used to select the origin of the IMA and arteriography was performed. No active bleeding on the initial angiogram. Given the clearly defined location of bleeding in the mid descending artery, a Cook cantata 2.5 French microcatheter was therefore used to select the left colic artery and arteriography was again performed. Again, no active bleeding. The microcatheter was further advanced into the descending branch of the left colic artery and arteriography was performed. No evidence of active bleeding. The microcatheter was pulled back and advanced into the ascending branch of the left colic artery and arteriography was performed. No evidence of active bleeding. Anatomic leak, the region of colon most corresponding to the recent visualized bleed on CT arteriography is supplied by the descending branch of the left colic artery. Therefore, the microcatheter  was again advanced into the descending branch of the left colic artery and advanced distally just proximal to its bifurcation into the many Vasa recta branches. At this time, heparin  was administered intravenously. TPA was prepared and 4 mg of tPA was administered through the catheter followed by a total of 200 mcg of nitroglycerin . After several minutes additional arteriography was performed. This time, active bleeding is visualized from 1 of the tortuous distal branches. The microcatheter was used to select several of the small branches in till the active bleeding was successfully visualized. At this time, coil embolization was performed using a series of detachable penumbra microcoils. Follow-up arteriography demonstrates no further active bleeding. The microcatheter was removed. Contrast was injected through the base catheter at the origin of the IMA. Unfortunately, this resulted in a small iatrogenic dissection of the proximal IMA. The catheter was moved and repeat injection performed gingerly. The dissection is not flow limiting. This is likely of no consequence and should heal without any further intervention. IMPRESSION: 1. Successful provocative mesenteric angiogram with recreation and visualization of the site of bleeding. 2. Successful coil embolization of distal branches of the descending branch of the left colic artery with cessation of bleeding. 3. 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 This is likely of no consequence and should heal without any further intervention. Electronically Signed   By: Wilkie Lent M.D.   On: 05/17/2024 07:29   IR EMBO ART  VEN HEMORR LYMPH EXTRAV  INC GUIDE ROADMAPPING Result Date: 05/17/2024 INDICATION: 73 year old female with ongoing acute lower GI bleed. CT arteriography was positive late yesterday and patient underwent angiography earlier this morning which was negative for evidence of active bleeding. Patient then did well throughout  much of the day before developing recurrent bright red blood per rectum and need for additional transfusion this afternoon. CT arteriography was repeated and confirmed recurrent bleeding in the mid descending colon. Therefore, patient presents for repeat arteriography and embolization. EXAM: SELECTIVE VISCERAL ARTERIOGRAPHY; ADDITIONAL ARTERIOGRAPHY; IR EMBO ART VEN HEMORR LYMPH EXTRAV INC GUIDE ROADMAPPING; IR ULTRASOUND GUIDANCE VASC ACCESS LEFT MEDICATIONS: 5000 units of heparin  was administered intravenously by the Radiology nurse. 4 mg tPA and 200 mcg nitroglycerin  were administered intra-arterially by me. ANESTHESIA/SEDATION: Moderate (conscious) sedation was employed during this procedure. A total of Versed  2 mg and Fentanyl  200 mcg was administered intravenously by the Radiology nurse. Moderate Sedation Time: 98 minutes. The patient's level  of consciousness and vital signs were monitored continuously by radiology nursing throughout the procedure under my direct supervision. CONTRAST:  50mL OMNIPAQUE  IOHEXOL  300 MG/ML SOLN, 40mL OMNIPAQUE  IOHEXOL  300 MG/ML SOLN, 50mL OMNIPAQUE  IOHEXOL  300 MG/ML SOLN FLUOROSCOPY: Radiation Exposure Index (as provided by the fluoroscopic device): 2684 mGy Kerma COMPLICATIONS: SIR Level A - No therapy, no consequence. PROCEDURE: Informed consent was obtained from the patient following explanation of the procedure, risks, benefits and alternatives. The patient understands, agrees and consents for the procedure. All questions were addressed. A time out was performed prior to the initiation of the procedure. Maximal barrier sterile technique utilized including caps, mask, sterile gowns, sterile gloves, large sterile drape, hand hygiene, and Betadine prep. The left common femoral artery was interrogated with ultrasound and found to be widely patent. An image was obtained and stored for the medical record. Local anesthesia was attained by infiltration with 1% lidocaine . A small  dermatotomy was made. Under real-time sonographic guidance, the vessel was punctured with a 21 gauge micropuncture needle. Using standard technique, the initial micro needle was exchanged over a 0.018 micro wire for a transitional 4 French micro sheath. The micro sheath was then exchanged over a 0.035 wire for a 5 French vascular sheath. Unfortunately, the department is out of RIM catheters. Therefore a Sos Omni selective catheter was advanced over a Bentson wire into the abdominal aorta and formed. The catheter was used to select the origin of the IMA and arteriography was performed. No active bleeding on the initial angiogram. Given the clearly defined location of bleeding in the mid descending artery, a Cook cantata 2.5 French microcatheter was therefore used to select the left colic artery and arteriography was again performed. Again, no active bleeding. The microcatheter was further advanced into the descending branch of the left colic artery and arteriography was performed. No evidence of active bleeding. The microcatheter was pulled back and advanced into the ascending branch of the left colic artery and arteriography was performed. No evidence of active bleeding. Anatomic leak, the region of colon most corresponding to the recent visualized bleed on CT arteriography is supplied by the descending branch of the left colic artery. Therefore, the microcatheter was again advanced into the descending branch of the left colic artery and advanced distally just proximal to its bifurcation into the many Vasa recta branches. At this time, heparin  was administered intravenously. TPA was prepared and 4 mg of tPA was administered through the catheter followed by a total of 200 mcg of nitroglycerin . After several minutes additional arteriography was performed. This time, active bleeding is visualized from 1 of the tortuous distal branches. The microcatheter was used to select several of the small branches in till the  active bleeding was successfully visualized. At this time, coil embolization was performed using a series of detachable penumbra microcoils. Follow-up arteriography demonstrates no further active bleeding. The microcatheter was removed. Contrast was injected through the base catheter at the origin of the IMA. Unfortunately, this resulted in a small iatrogenic dissection of the proximal IMA. The catheter was moved and repeat injection performed gingerly. The dissection is not flow limiting. This is likely of no consequence and should heal without any further intervention. IMPRESSION: 1. Successful provocative mesenteric angiogram with recreation and visualization of the site of bleeding. 2. Successful coil embolization of distal branches of the descending branch of the left colic artery with cessation of bleeding. 3. 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 This is likely of no consequence  and should heal without any further intervention. Electronically Signed   By: Wilkie Lent M.D.   On: 05/17/2024 07:29   IR Angiogram Selective Each Additional Vessel Result Date: 05/17/2024 INDICATION: 73 year old female with ongoing acute lower GI bleed. CT arteriography was positive late yesterday and patient underwent angiography earlier this morning which was negative for evidence of active bleeding. Patient then did well throughout much of the day before developing recurrent bright red blood per rectum and need for additional transfusion this afternoon. CT arteriography was repeated and confirmed recurrent bleeding in the mid descending colon. Therefore, patient presents for repeat arteriography and embolization. EXAM: SELECTIVE VISCERAL ARTERIOGRAPHY; ADDITIONAL ARTERIOGRAPHY; IR EMBO ART VEN HEMORR LYMPH EXTRAV INC GUIDE ROADMAPPING; IR ULTRASOUND GUIDANCE VASC ACCESS LEFT MEDICATIONS: 5000 units of heparin  was administered intravenously by the Radiology nurse. 4 mg tPA and 200 mcg  nitroglycerin  were administered intra-arterially by me. ANESTHESIA/SEDATION: Moderate (conscious) sedation was employed during this procedure. A total of Versed  2 mg and Fentanyl  200 mcg was administered intravenously by the Radiology nurse. Moderate Sedation Time: 98 minutes. The patient's level of consciousness and vital signs were monitored continuously by radiology nursing throughout the procedure under my direct supervision. CONTRAST:  50mL OMNIPAQUE  IOHEXOL  300 MG/ML SOLN, 40mL OMNIPAQUE  IOHEXOL  300 MG/ML SOLN, 50mL OMNIPAQUE  IOHEXOL  300 MG/ML SOLN FLUOROSCOPY: Radiation Exposure Index (as provided by the fluoroscopic device): 2684 mGy Kerma COMPLICATIONS: SIR Level A - No therapy, no consequence. PROCEDURE: Informed consent was obtained from the patient following explanation of the procedure, risks, benefits and alternatives. The patient understands, agrees and consents for the procedure. All questions were addressed. A time out was performed prior to the initiation of the procedure. Maximal barrier sterile technique utilized including caps, mask, sterile gowns, sterile gloves, large sterile drape, hand hygiene, and Betadine prep. The left common femoral artery was interrogated with ultrasound and found to be widely patent. An image was obtained and stored for the medical record. Local anesthesia was attained by infiltration with 1% lidocaine . A small dermatotomy was made. Under real-time sonographic guidance, the vessel was punctured with a 21 gauge micropuncture needle. Using standard technique, the initial micro needle was exchanged over a 0.018 micro wire for a transitional 4 French micro sheath. The micro sheath was then exchanged over a 0.035 wire for a 5 French vascular sheath. Unfortunately, the department is out of RIM catheters. Therefore a [employer] selective catheter was advanced over a Bentson wire into the abdominal aorta and formed. The catheter was used to select the origin of the IMA and  arteriography was performed. No active bleeding on the initial angiogram. Given the clearly defined location of bleeding in the mid descending artery, a Cook cantata 2.5 French microcatheter was therefore used to select the left colic artery and arteriography was again performed. Again, no active bleeding. The microcatheter was further advanced into the descending branch of the left colic artery and arteriography was performed. No evidence of active bleeding. The microcatheter was pulled back and advanced into the ascending branch of the left colic artery and arteriography was performed. No evidence of active bleeding. Anatomic leak, the region of colon most corresponding to the recent visualized bleed on CT arteriography is supplied by the descending branch of the left colic artery. Therefore, the microcatheter was again advanced into the descending branch of the left colic artery and advanced distally just proximal to its bifurcation into the many Vasa recta branches. At this time, heparin  was administered intravenously. TPA was prepared and  4 mg of tPA was administered through the catheter followed by a total of 200 mcg of nitroglycerin . After several minutes additional arteriography was performed. This time, active bleeding is visualized from 1 of the tortuous distal branches. The microcatheter was used to select several of the small branches in till the active bleeding was successfully visualized. At this time, coil embolization was performed using a series of detachable penumbra microcoils. Follow-up arteriography demonstrates no further active bleeding. The microcatheter was removed. Contrast was injected through the base catheter at the origin of the IMA. Unfortunately, this resulted in a small iatrogenic dissection of the proximal IMA. The catheter was moved and repeat injection performed gingerly. The dissection is not flow limiting. This is likely of no consequence and should heal without any further  intervention. IMPRESSION: 1. Successful provocative mesenteric angiogram with recreation and visualization of the site of bleeding. 2. Successful coil embolization of distal branches of the descending branch of the left colic artery with cessation of bleeding. 3. 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 This is likely of no consequence and should heal without any further intervention. Electronically Signed   By: Wilkie Lent M.D.   On: 05/17/2024 07:29   IR Angiogram Selective Each Additional Vessel Result Date: 05/17/2024 INDICATION: 73 year old female with ongoing acute lower GI bleed. CT arteriography was positive late yesterday and patient underwent angiography earlier this morning which was negative for evidence of active bleeding. Patient then did well throughout much of the day before developing recurrent bright red blood per rectum and need for additional transfusion this afternoon. CT arteriography was repeated and confirmed recurrent bleeding in the mid descending colon. Therefore, patient presents for repeat arteriography and embolization. EXAM: SELECTIVE VISCERAL ARTERIOGRAPHY; ADDITIONAL ARTERIOGRAPHY; IR EMBO ART VEN HEMORR LYMPH EXTRAV INC GUIDE ROADMAPPING; IR ULTRASOUND GUIDANCE VASC ACCESS LEFT MEDICATIONS: 5000 units of heparin  was administered intravenously by the Radiology nurse. 4 mg tPA and 200 mcg nitroglycerin  were administered intra-arterially by me. ANESTHESIA/SEDATION: Moderate (conscious) sedation was employed during this procedure. A total of Versed  2 mg and Fentanyl  200 mcg was administered intravenously by the Radiology nurse. Moderate Sedation Time: 98 minutes. The patient's level of consciousness and vital signs were monitored continuously by radiology nursing throughout the procedure under my direct supervision. CONTRAST:  50mL OMNIPAQUE  IOHEXOL  300 MG/ML SOLN, 40mL OMNIPAQUE  IOHEXOL  300 MG/ML SOLN, 50mL OMNIPAQUE  IOHEXOL  300 MG/ML SOLN  FLUOROSCOPY: Radiation Exposure Index (as provided by the fluoroscopic device): 2684 mGy Kerma COMPLICATIONS: SIR Level A - No therapy, no consequence. PROCEDURE: Informed consent was obtained from the patient following explanation of the procedure, risks, benefits and alternatives. The patient understands, agrees and consents for the procedure. All questions were addressed. A time out was performed prior to the initiation of the procedure. Maximal barrier sterile technique utilized including caps, mask, sterile gowns, sterile gloves, large sterile drape, hand hygiene, and Betadine prep. The left common femoral artery was interrogated with ultrasound and found to be widely patent. An image was obtained and stored for the medical record. Local anesthesia was attained by infiltration with 1% lidocaine . A small dermatotomy was made. Under real-time sonographic guidance, the vessel was punctured with a 21 gauge micropuncture needle. Using standard technique, the initial micro needle was exchanged over a 0.018 micro wire for a transitional 4 French micro sheath. The micro sheath was then exchanged over a 0.035 wire for a 5 French vascular sheath. Unfortunately, the department is out of RIM catheters. Therefore a [employer]  selective catheter was advanced over a Bentson wire into the abdominal aorta and formed. The catheter was used to select the origin of the IMA and arteriography was performed. No active bleeding on the initial angiogram. Given the clearly defined location of bleeding in the mid descending artery, a Cook cantata 2.5 French microcatheter was therefore used to select the left colic artery and arteriography was again performed. Again, no active bleeding. The microcatheter was further advanced into the descending branch of the left colic artery and arteriography was performed. No evidence of active bleeding. The microcatheter was pulled back and advanced into the ascending branch of the left colic artery and  arteriography was performed. No evidence of active bleeding. Anatomic leak, the region of colon most corresponding to the recent visualized bleed on CT arteriography is supplied by the descending branch of the left colic artery. Therefore, the microcatheter was again advanced into the descending branch of the left colic artery and advanced distally just proximal to its bifurcation into the many Vasa recta branches. At this time, heparin  was administered intravenously. TPA was prepared and 4 mg of tPA was administered through the catheter followed by a total of 200 mcg of nitroglycerin . After several minutes additional arteriography was performed. This time, active bleeding is visualized from 1 of the tortuous distal branches. The microcatheter was used to select several of the small branches in till the active bleeding was successfully visualized. At this time, coil embolization was performed using a series of detachable penumbra microcoils. Follow-up arteriography demonstrates no further active bleeding. The microcatheter was removed. Contrast was injected through the base catheter at the origin of the IMA. Unfortunately, this resulted in a small iatrogenic dissection of the proximal IMA. The catheter was moved and repeat injection performed gingerly. The dissection is not flow limiting. This is likely of no consequence and should heal without any further intervention. IMPRESSION: 1. Successful provocative mesenteric angiogram with recreation and visualization of the site of bleeding. 2. Successful coil embolization of distal branches of the descending branch of the left colic artery with cessation of bleeding. 3. 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 This is likely of no consequence and should heal without any further intervention. Electronically Signed   By: Wilkie Lent M.D.   On: 05/17/2024 07:29   CT ANGIO GI BLEED Result Date: 05/16/2024 CLINICAL DATA:  Active rectal  bleeding EXAM: CTA ABDOMEN AND PELVIS WITHOUT AND WITH CONTRAST TECHNIQUE: Multidetector CT imaging of the abdomen and pelvis was performed using the standard protocol during bolus administration of intravenous contrast. Multiplanar reconstructed images and MIPs were obtained and reviewed to evaluate the vascular anatomy. RADIATION DOSE REDUCTION: This exam was performed according to the departmental dose-optimization program which includes automated exposure control, adjustment of the mA and/or kV according to patient size and/or use of iterative reconstruction technique. CONTRAST:  OMNIPAQUE  IOHEXOL  350 MG/ML SOLN COMPARISON:  05/16/2024, 05/15/2024 FINDINGS: VASCULAR Aorta: Normal caliber aorta without aneurysm, dissection, vasculitis or significant stenosis. Minimal atherosclerosis. Celiac: Patent without evidence of aneurysm, dissection, vasculitis or significant stenosis. SMA: Patent without evidence of aneurysm, dissection, vasculitis or significant stenosis. Renals: Both renal arteries are patent without evidence of aneurysm, dissection, vasculitis, fibromuscular dysplasia or significant stenosis. Mild atherosclerosis of the left renal artery. IMA: Patent without evidence of aneurysm, dissection, vasculitis or significant stenosis. Inflow: Patent without evidence of aneurysm, dissection, vasculitis or significant stenosis. Mild atherosclerosis. Proximal Outflow: Bilateral common femoral and visualized portions of the superficial and profunda  femoral arteries are patent without evidence of aneurysm, dissection, vasculitis or significant stenosis. Postprocedural changes are seen within the right common femoral artery related to recent visceral arteriogram. There is a 0.8 x 0.7 x 1.1 cm pseudoaneurysm off the ventral margin of the common femoral artery, with a patent narrow neck measuring 0.3 cm supplying the pseudoaneurysm. Mild surrounding subcutaneous fat stranding. Veins: No obvious venous abnormality  within the limitations of this arterial phase study. Review of the MIP images confirms the above findings. NON-VASCULAR Lower chest: No acute pleural or parenchymal lung disease. Hepatobiliary: Gallbladder sludge versus vicarious excretion of previously administered contrast. No cholecystitis. The liver is unremarkable. Pancreas: Unremarkable. No pancreatic ductal dilatation or surrounding inflammatory changes. Spleen: Normal in size without focal abnormality. Adrenals/Urinary Tract: Adrenal glands are unremarkable. Kidneys are normal, without renal calculi, focal lesion, or hydronephrosis. Nonspecific gas in the bladder lumen, please correlate with any recent instrumentation or catheterization. Stomach/Bowel: There is intraluminal accumulation of contrast along the mesenteric wall of the mid descending colon, reference axial images 95 through 108 of series 6. This is in a similar distribution to where the active hemorrhage was reported on prior CTA, and is more subtle on this exam than prior. No other areas of active gastrointestinal hemorrhage are identified. No bowel obstruction or ileus. Scattered gas fluid levels throughout the colon may reflect diarrhea. Small hiatal hernia. Lymphatic: No pathologic adenopathy. Reproductive: Status post hysterectomy. No adnexal masses. Other: No free fluid or free intraperitoneal gas. 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 No incarceration or obstruction. Musculoskeletal: No acute or destructive bony abnormalities. Reconstructed images demonstrate no additional findings. IMPRESSION: VASCULAR 1. Active gastrointestinal hemorrhage along the mesenteric wall of the mid descending colon, in a similar location where the active hemorrhage with seen on previous CT angiography procedure. Visceral angiogram performed earlier today did not demonstrate active hemorrhage in this region however. 2. 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 3.  Aortic Atherosclerosis (ICD10-I70.0). NON-VASCULAR 1. Colonic gas fluid levels consistent with diarrhea. 2. Stable midline ventral hernia. 3. Gallbladder sludge versus vicarious excretion of contrast. No evidence of acute cholecystitis. Critical Value/emergent results were called by telephone at the time of interpretation on 05/16/2024 at 6:52 pm to provider [facility] , who verbally acknowledged these results. Electronically Signed   By: Ozell Daring M.D.   On: 05/16/2024 18:59   IR Angiogram Visceral Selective Result Date: 05/16/2024 INDICATION: 73 year old female with history of acute lower gastrointestinal hemorrhage likely secondary to diverticulosis with associated anemia. EXAM: IR ULTRASOUND GUIDANCE VASC ACCESS RIGHT; ADDITIONAL ARTERIOGRAPHY; SELECTIVE VISCERAL ARTERIOGRAPHY MEDICATIONS: None. ANESTHESIA/SEDATION: Moderate (conscious) sedation was employed during this procedure. A total of Versed  2 mg and Fentanyl  100 mcg was administered intravenously. Moderate Sedation Time: 20 minutes. The patient's level of consciousness and vital signs were monitored continuously by radiology nursing throughout the procedure under my direct supervision. CONTRAST:  OMNIPAQUE  IOHEXOL  350 MG/ML SOLN, 25mL OMNIPAQUE  IOHEXOL  300 MG/ML SOLN, 25mL OMNIPAQUE  IOHEXOL  300 MG/ML SOLN FLUOROSCOPY: Radiation Exposure Index (as provided by the fluoroscopic device): 613 mGy reference air Kerma COMPLICATIONS: None immediate. PROCEDURE: Informed consent was obtained from the patient following explanation of the procedure, risks, benefits and alternatives. The patient understands, agrees and consents for the procedure. All questions were addressed. A time out was performed prior to the initiation of the procedure. Maximal barrier sterile technique utilized including caps, mask,  sterile gowns, sterile gloves, large sterile drape,  hand hygiene, and Betadine prep. Preprocedure ultrasound evaluation of the right common femoral artery demonstrated patency. The procedure was planned. Subdermal Local anesthesia was administered at the planned needle entry site with 1% lidocaine . A small skin nick was made. Under direct ultrasound visualization, a 21 gauge micropuncture needle was directed into the right common femoral artery. A permanent cm captured and stored in the record. A micropuncture sheath was then introduced through which a limited right lower extremity angiogram was performed which demonstrated adequate puncture site for closure device use. A Bentson wire was directed to the abdominal aorta over which the micropuncture sheath was exchanged for a 5 French vascular sheath. A 5 French rim catheter was introduced over the wire and used to select the inferior mesenteric artery. Inferior mesenteric angiogram was performed which demonstrated patency throughout with arterial supply to the descending and sigmoid colon. There was no obvious extravasation. A 2.4 French Progreat microcatheter and fathom 16 microwire were then used to select the left colic artery middle branch. Dedicated angiogram was performed at this location which demonstrated no evidence of active extravasation in the descending colon. A more distal, descending branch of the left colic artery was selected and repeat angiogram was performed. Again, no evidence of active extravasation was seen at this site. The catheter was redirected to a superior left colic branch and repeat angiogram was performed at this location. Again, no evidence of active extravasation was visualized. Microcatheter was then retracted to the proximal inferior mesenteric artery and repeat angiogram was performed which demonstrated no evidence of active extravasation. The catheters were removed. The 5 French sheath was exchanged for 6 French Angio-Seal device which was deployed successfully without  complication. Peripheral pulses were unchanged. A sterile bandage was applied. The patient tolerated the procedure well was transferred back to the ED in good condition. IMPRESSION: Technically successful inferior mesenteric angiogram with selective angiography of the left colic artery branches without evidence of active extravasation. No embolization was performed. Ester Sides, MD [facility] Electronically Signed   By: Ester Sides M.D.   On: 05/16/2024 11:17   IR US  Guide Vasc Access Right Result Date: 05/16/2024 INDICATION: 73 year old female with history of acute lower gastrointestinal hemorrhage likely secondary to diverticulosis with associated anemia. EXAM: IR ULTRASOUND GUIDANCE VASC ACCESS RIGHT; ADDITIONAL ARTERIOGRAPHY; SELECTIVE VISCERAL ARTERIOGRAPHY MEDICATIONS: None. ANESTHESIA/SEDATION: Moderate (conscious) sedation was employed during this procedure. A total of Versed  2 mg and Fentanyl  100 mcg was administered intravenously. Moderate Sedation Time: 20 minutes. The patient's level of consciousness and vital signs were monitored continuously by radiology nursing throughout the procedure under my direct supervision. CONTRAST:  OMNIPAQUE  IOHEXOL  350 MG/ML SOLN, 25mL OMNIPAQUE  IOHEXOL  300 MG/ML SOLN, 25mL OMNIPAQUE  IOHEXOL  300 MG/ML SOLN FLUOROSCOPY: Radiation Exposure Index (as provided by the fluoroscopic device): 613 mGy reference air Kerma COMPLICATIONS: None immediate. PROCEDURE: Informed consent was obtained from the patient following explanation of the procedure, risks, benefits and alternatives. The patient understands, agrees and consents for the procedure. All questions were addressed. A time out was performed prior to the initiation of the procedure. Maximal barrier sterile technique utilized including caps, mask, sterile gowns, sterile gloves, large sterile drape, hand hygiene, and Betadine prep. Preprocedure ultrasound  evaluation of the right common femoral artery demonstrated patency. The procedure was planned. Subdermal Local anesthesia was administered at the planned needle entry site with 1% lidocaine . A small skin nick was made. Under direct ultrasound visualization,  a 21 gauge micropuncture needle was directed into the right common femoral artery. A permanent cm captured and stored in the record. A micropuncture sheath was then introduced through which a limited right lower extremity angiogram was performed which demonstrated adequate puncture site for closure device use. A Bentson wire was directed to the abdominal aorta over which the micropuncture sheath was exchanged for a 5 French vascular sheath. A 5 French rim catheter was introduced over the wire and used to select the inferior mesenteric artery. Inferior mesenteric angiogram was performed which demonstrated patency throughout with arterial supply to the descending and sigmoid colon. There was no obvious extravasation. A 2.4 French Progreat microcatheter and fathom 16 microwire were then used to select the left colic artery middle branch. Dedicated angiogram was performed at this location which demonstrated no evidence of active extravasation in the descending colon. A more distal, descending branch of the left colic artery was selected and repeat angiogram was performed. Again, no evidence of active extravasation was seen at this site. The catheter was redirected to a superior left colic branch and repeat angiogram was performed at this location. Again, no evidence of active extravasation was visualized. Microcatheter was then retracted to the proximal inferior mesenteric artery and repeat angiogram was performed which demonstrated no evidence of active extravasation. The catheters were removed. The 5 French sheath was exchanged for 6 French Angio-Seal device which was deployed successfully without complication. Peripheral pulses were unchanged. A sterile bandage  was applied. The patient tolerated the procedure well was transferred back to the ED in good condition. IMPRESSION: Technically successful inferior mesenteric angiogram with selective angiography of the left colic artery branches without evidence of active extravasation. No embolization was performed. Ester Sides, MD [facility] Radiology Electronically Signed   By: Ester Sides M.D.   On: 05/16/2024 11:17   IR Angiogram Selective Each Additional Vessel Result Date: 05/16/2024 INDICATION: 73 year old female with history of acute lower gastrointestinal hemorrhage likely secondary to diverticulosis with associated anemia. EXAM: IR ULTRASOUND GUIDANCE VASC ACCESS RIGHT; ADDITIONAL ARTERIOGRAPHY; SELECTIVE VISCERAL ARTERIOGRAPHY MEDICATIONS: None. ANESTHESIA/SEDATION: Moderate (conscious) sedation was employed during this procedure. A total of Versed  2 mg and Fentanyl  100 mcg was administered intravenously. Moderate Sedation Time: 20 minutes. The patient's level of consciousness and vital signs were monitored continuously by radiology nursing throughout the procedure under my direct supervision. CONTRAST:  OMNIPAQUE  IOHEXOL  350 MG/ML SOLN, 25mL OMNIPAQUE  IOHEXOL  300 MG/ML SOLN, 25mL OMNIPAQUE  IOHEXOL  300 MG/ML SOLN FLUOROSCOPY: Radiation Exposure Index (as provided by the fluoroscopic device): 613 mGy reference air Kerma COMPLICATIONS: None immediate. PROCEDURE: Informed consent was obtained from the patient following explanation of the procedure, risks, benefits and alternatives. The patient understands, agrees and consents for the procedure. All questions were addressed. A time out was performed prior to the initiation of the procedure. Maximal barrier sterile technique utilized including caps, mask, sterile gowns, sterile gloves, large sterile drape, hand hygiene, and Betadine prep. Preprocedure ultrasound evaluation of the right common femoral artery  demonstrated patency. The procedure was planned. Subdermal Local anesthesia was administered at the planned needle entry site with 1% lidocaine . A small skin nick was made. Under direct ultrasound visualization, a 21 gauge micropuncture needle was directed into the right common femoral artery. A permanent cm captured and stored in the record. A micropuncture sheath was then introduced through which a limited right lower extremity angiogram was performed which demonstrated adequate puncture site for closure device use. A Bentson wire was directed  to the abdominal aorta over which the micropuncture sheath was exchanged for a 5 French vascular sheath. A 5 French rim catheter was introduced over the wire and used to select the inferior mesenteric artery. Inferior mesenteric angiogram was performed which demonstrated patency throughout with arterial supply to the descending and sigmoid colon. There was no obvious extravasation. A 2.4 French Progreat microcatheter and fathom 16 microwire were then used to select the left colic artery middle branch. Dedicated angiogram was performed at this location which demonstrated no evidence of active extravasation in the descending colon. A more distal, descending branch of the left colic artery was selected and repeat angiogram was performed. Again, no evidence of active extravasation was seen at this site. The catheter was redirected to a superior left colic branch and repeat angiogram was performed at this location. Again, no evidence of active extravasation was visualized. Microcatheter was then retracted to the proximal inferior mesenteric artery and repeat angiogram was performed which demonstrated no evidence of active extravasation. The catheters were removed. The 5 French sheath was exchanged for 6 French Angio-Seal device which was deployed successfully without complication. Peripheral pulses were unchanged. A sterile bandage was applied. The patient tolerated the procedure  well was transferred back to the ED in good condition. IMPRESSION: Technically successful inferior mesenteric angiogram with selective angiography of the left colic artery branches without evidence of active extravasation. No embolization was performed. Ester Sides, MD [facility] Radiology Electronically Signed   By: Ester Sides M.D.   On: 05/16/2024 11:17   IR Angiogram Selective Each Additional Vessel Result Date: 05/16/2024 INDICATION: 73 year old female with history of acute lower gastrointestinal hemorrhage likely secondary to diverticulosis with associated anemia. EXAM: IR ULTRASOUND GUIDANCE VASC ACCESS RIGHT; ADDITIONAL ARTERIOGRAPHY; SELECTIVE VISCERAL ARTERIOGRAPHY MEDICATIONS: None. ANESTHESIA/SEDATION: Moderate (conscious) sedation was employed during this procedure. A total of Versed  2 mg and Fentanyl  100 mcg was administered intravenously. Moderate Sedation Time: 20 minutes. The patient's level of consciousness and vital signs were monitored continuously by radiology nursing throughout the procedure under my direct supervision. CONTRAST:  OMNIPAQUE  IOHEXOL  350 MG/ML SOLN, 25mL OMNIPAQUE  IOHEXOL  300 MG/ML SOLN, 25mL OMNIPAQUE  IOHEXOL  300 MG/ML SOLN FLUOROSCOPY: Radiation Exposure Index (as provided by the fluoroscopic device): 613 mGy reference air Kerma COMPLICATIONS: None immediate. PROCEDURE: Informed consent was obtained from the patient following explanation of the procedure, risks, benefits and alternatives. The patient understands, agrees and consents for the procedure. All questions were addressed. A time out was performed prior to the initiation of the procedure. Maximal barrier sterile technique utilized including caps, mask, sterile gowns, sterile gloves, large sterile drape, hand hygiene, and Betadine prep. Preprocedure ultrasound evaluation of the right common femoral artery demonstrated patency. The procedure was planned.  Subdermal Local anesthesia was administered at the planned needle entry site with 1% lidocaine . A small skin nick was made. Under direct ultrasound visualization, a 21 gauge micropuncture needle was directed into the right common femoral artery. A permanent cm captured and stored in the record. A micropuncture sheath was then introduced through which a limited right lower extremity angiogram was performed which demonstrated adequate puncture site for closure device use. A Bentson wire was directed to the abdominal aorta over which the micropuncture sheath was exchanged for a 5 French vascular sheath. A 5 French rim catheter was introduced over the wire and used to select the inferior mesenteric artery. Inferior mesenteric angiogram was performed which demonstrated patency throughout with arterial supply to the descending and sigmoid  colon. There was no obvious extravasation. A 2.4 French Progreat microcatheter and fathom 16 microwire were then used to select the left colic artery middle branch. Dedicated angiogram was performed at this location which demonstrated no evidence of active extravasation in the descending colon. A more distal, descending branch of the left colic artery was selected and repeat angiogram was performed. Again, no evidence of active extravasation was seen at this site. The catheter was redirected to a superior left colic branch and repeat angiogram was performed at this location. Again, no evidence of active extravasation was visualized. Microcatheter was then retracted to the proximal inferior mesenteric artery and repeat angiogram was performed which demonstrated no evidence of active extravasation. The catheters were removed. The 5 French sheath was exchanged for 6 French Angio-Seal device which was deployed successfully without complication. Peripheral pulses were unchanged. A sterile bandage was applied. The patient tolerated the procedure well was transferred back to the ED in good  condition. IMPRESSION: Technically successful inferior mesenteric angiogram with selective angiography of the left colic artery branches without evidence of active extravasation. No embolization was performed. Ester Sides, MD [facility] Radiology Electronically Signed   By: Ester Sides M.D.   On: 05/16/2024 11:17   CT Angio Abd/Pel W and/or Wo Contrast Result Date: 05/15/2024 EXAM: CTA ABDOMEN AND PELVIS WITH AND WITHOUT CONTRAST 05/15/2024 10:47:14 PM TECHNIQUE: CTA images of the abdomen and pelvis without and with intravenous contrast. Three-dimensional MIP/volume rendered formations were performed. Automated exposure control, iterative reconstruction, and/or weight based adjustment of the mA/kV was utilized to reduce the radiation dose to as low as reasonably achievable. COMPARISON: Comparison with abdominal radiograph 05/14/2024 and CT abdomen and pelvis 04/01/2024. CLINICAL HISTORY: Lower GI bleed. FINDINGS: VASCULATURE: AORTA: Minimal scattered aortic calcification. No acute finding. No abdominal aortic aneurysm. No dissection. CELIAC TRUNK: No acute finding. No occlusion or significant stenosis. SUPERIOR MESENTERIC ARTERY: No acute finding. No occlusion or significant stenosis. RENAL ARTERIES: No acute finding. No occlusion or significant stenosis. ILIAC ARTERIES: No acute finding. No occlusion or significant stenosis. LIVER: The liver is unremarkable. GALLBLADDER AND BILE DUCTS: Gallbladder is unremarkable. No biliary ductal dilatation. SPLEEN: The spleen is unremarkable. PANCREAS: The pancreas is unremarkable. ADRENAL GLANDS: Bilateral adrenal glands demonstrate no acute abnormality. KIDNEYS, URETERS AND BLADDER: No stones in the kidneys or ureters. No hydronephrosis. No perinephric or periureteral stranding. Urinary bladder is unremarkable. GI AND BOWEL: Small periumbilical hernia with broad base containing small bowel and colon but without proximal  obstruction. Diffusely stool-filled colon. No small or large bowel distention. Colonic diverticula without evidence of acute diverticulitis. In the mid descending colon, there is a focal area of intraluminal contrast extravasation on the arterial phase with progressive pooling on the delayed phase. No corresponding density is demonstrated on the unenhanced images. This is consistent with a focal site of acute gastrointestinal hemorrhage. There is a diverticulum adjacent to the area of hemorrhage, suggesting that this is most likely to represent diverticular bleed , less likely due to an occult mass. REPRODUCTIVE: The uterus is surgically absent. PERITONEUM AND RETRPERITONEUM: No ascites or free air. LUNG BASE: Lung bases are clear. LYMPH NODES: No lymphadenopathy. BONES AND SOFT TISSUES: Degenerative changes in the spine. Scarring along the midline consistent with postoperative change. No acute abnormality of the bones. No acute soft tissue abnormality. Critical results/urgent findings were called at 10:56 pm on 05/15/2024 by radiologist W. Okey Gravely, MD to Dr. Jerilynn Later. IMPRESSION: 1. Focal site of acute gastrointestinal  hemorrhage in the mid descending colon, with intraluminal contrast extravasation on arterial phase and progressive pooling on delayed phase. Etiology is indeterminate but most likely to represent diverticular bleed. 2. Small periumbilical hernia containing small bowel and colon without proximal obstruction. Electronically signed by: Elsie Gravely MD 05/15/2024 11:02 PM EST RP Workstation: [id number]       Discharge Exam: Vitals:   05/19/24 2059 05/20/24 0602  BP:  (!) 112/54  Pulse: 71 64  Resp: 16 15  Temp:  (!) 97.4 F (36.3 C)  SpO2: 96% 95%    General: Pt is alert, awake, not in acute distress Cardiovascular: RRR, S1/S2 +, no edema Respiratory: CTA bilaterally, no wheezing, no rhonchi, no respiratory distress, no conversational dyspnea  Abdominal: Soft, NT, ND,  bowel sounds + Psych: Normal mood and affect, stable judgement and insight     The results of significant diagnostics from this hospitalization (including imaging, microbiology, ancillary and laboratory) are listed below for reference.     Microbiology: Recent Results (from the past 240 hours)  MRSA Next Gen by PCR, Nasal     Status: None   Collection Time: 05/16/24  4:18 PM   Specimen: Nasal Mucosa; Nasal Swab  Result Value Ref Range Status   MRSA by PCR Next Gen NOT DETECTED NOT DETECTED Final    Comment: (NOTE) The GeneXpert MRSA Assay (FDA approved for NASAL specimens only), is one component of a comprehensive MRSA colonization surveillance program. It is not intended to diagnose MRSA infection nor to guide or monitor treatment for MRSA infections. Test performance is not FDA approved in patients less than 58 years old. Performed at [facility], [address], [place], [place] [address]      Labs: BNP (last 3 results) No results for input(s): BNP in the last 8760 hours. Basic Metabolic Panel: Recent Labs  Lab 05/15/24 0927 05/15/24 2105 05/16/24 0519 05/18/24 0516  NA 139 140 140 140  K 4.1 4.4 4.0 3.6  CL 102 103 106 107  CO2 28 26 27 27   GLUCOSE 100* 119* 111* 101*  BUN 14 16 16  7*  CREATININE 0.67 0.80 0.59 0.57  CALCIUM  9.3 9.1 8.2* 8.0*   Liver Function Tests: Recent Labs  Lab 05/15/24 0927 05/15/24 2105  AST 23 24  ALT 15 14  ALKPHOS 73 81  BILITOT 0.5 0.4  PROT 7.1 6.7  ALBUMIN 4.4 4.1   No results for input(s): LIPASE, AMYLASE in the last 168 hours. No results for input(s): AMMONIA in the last 168 hours. CBC: Recent Labs  Lab 05/15/24 0927 05/15/24 2105 05/16/24 0519 05/16/24 1309 05/16/24 1830 05/17/24 0351 05/17/24 0656 05/18/24 0516 05/18/24 1304 05/19/24 0538 05/20/24 0636  WBC 3.5*   < > 5.0  --  8.6  --   --  6.8  --  5.4 4.8  NEUTROABS 2.4  --   --   --   --   --   --   --   --   --   --   HGB  9.6*   < > 8.0*   < > 7.9*   < > 9.8* 7.4* 8.2* 7.6* 7.4*  HCT 32.2*   < > 26.5*   < > 25.1*   < > 30.1* 23.7* 25.6* 24.4* 24.6*  MCV 85.2   < > 87.2  --  85.7  --   --  85.9  --  88.7 89.1  PLT 297   < > 231  --  202  --   --  171  --  189 198   < > = values in this interval not displayed.   Cardiac Enzymes: No results for input(s): CKTOTAL, CKMB, CKMBINDEX, TROPONINI in the last 168 hours. BNP: Invalid input(s): POCBNP CBG: No results for input(s): GLUCAP in the last 168 hours. D-Dimer No results for input(s): DDIMER in the last 72 hours. Hgb A1c No results for input(s): HGBA1C in the last 72 hours. Lipid Profile No results for input(s): CHOL, HDL, LDLCALC, TRIG, CHOLHDL, LDLDIRECT in the last 72 hours. Thyroid  function studies No results for input(s): TSH, T4TOTAL, T3FREE, THYROIDAB in the last 72 hours.  Invalid input(s): FREET3 Anemia work up No results for input(s): VITAMINB12, FOLATE, FERRITIN, TIBC, IRON , RETICCTPCT in the last 72 hours. Urinalysis    Component Value Date/Time   COLORURINE STRAW (A) 01/28/2022 1221   APPEARANCEUR CLEAR 01/28/2022 1221   APPEARANCEUR Clear 05/20/2020 1422   LABSPEC 1.003 (L) 01/28/2022 1221   PHURINE 8.0 01/28/2022 1221   GLUCOSEU NEGATIVE 01/28/2022 1221   HGBUR SMALL (A) 01/28/2022 1221   BILIRUBINUR NEGATIVE 01/28/2022 1221   BILIRUBINUR Negative 05/20/2020 1422   KETONESUR NEGATIVE 01/28/2022 1221   PROTEINUR NEGATIVE 01/28/2022 1221   UROBILINOGEN 1.0 10/31/2014 1646   NITRITE NEGATIVE 01/28/2022 1221   LEUKOCYTESUR NEGATIVE 01/28/2022 1221   Sepsis Labs Recent Labs  Lab 05/16/24 1830 05/18/24 0516 05/19/24 0538 05/20/24 0636  WBC 8.6 6.8 5.4 4.8   Microbiology Recent Results (from the past 240 hours)  MRSA Next Gen by PCR, Nasal     Status: None   Collection Time: 05/16/24  4:18 PM   Specimen: Nasal Mucosa; Nasal Swab  Result Value Ref Range Status   MRSA by PCR  Next Gen NOT DETECTED NOT DETECTED Final    Comment: (NOTE) The GeneXpert MRSA Assay (FDA approved for NASAL specimens only), is one component of a comprehensive MRSA colonization surveillance program. It is not intended to diagnose MRSA infection nor to guide or monitor treatment for MRSA infections. Test performance is not FDA approved in patients less than 21 years old. Performed at [facility], [address], [place], [place] [address]      Patient was seen and examined on the day of discharge and was found to be in stable condition. Time coordinating discharge: 25 minutes including assessment and coordination of care, as well as examination of the patient.   SIGNED:  Delon Hoe, DO [facility] 05/20/2024, 9:07 AM

## 2024-05-21 ENCOUNTER — Other Ambulatory Visit: Payer: Self-pay | Admitting: Hematology and Oncology

## 2024-05-21 DIAGNOSIS — D61818 Other pancytopenia: Secondary | ICD-10-CM

## 2024-05-22 ENCOUNTER — Encounter: Payer: Self-pay | Admitting: Hematology and Oncology

## 2024-05-22 ENCOUNTER — Other Ambulatory Visit (HOSPITAL_COMMUNITY): Payer: Self-pay | Admitting: Pharmacist

## 2024-05-22 ENCOUNTER — Inpatient Hospital Stay: Attending: Hematology and Oncology | Admitting: Hematology and Oncology

## 2024-05-22 ENCOUNTER — Telehealth (HOSPITAL_COMMUNITY): Payer: Self-pay | Admitting: Pharmacy Technician

## 2024-05-22 ENCOUNTER — Inpatient Hospital Stay

## 2024-05-22 VITALS — BP 133/60 | HR 71 | Temp 99.3°F | Resp 18 | Ht 64.0 in | Wt 234.2 lb

## 2024-05-22 DIAGNOSIS — C5701 Malignant neoplasm of right fallopian tube: Secondary | ICD-10-CM | POA: Diagnosis present

## 2024-05-22 DIAGNOSIS — D5701 Hb-SS disease with acute chest syndrome: Secondary | ICD-10-CM | POA: Diagnosis not present

## 2024-05-22 DIAGNOSIS — K922 Gastrointestinal hemorrhage, unspecified: Secondary | ICD-10-CM | POA: Insufficient documentation

## 2024-05-22 DIAGNOSIS — D500 Iron deficiency anemia secondary to blood loss (chronic): Secondary | ICD-10-CM | POA: Insufficient documentation

## 2024-05-22 DIAGNOSIS — D61818 Other pancytopenia: Secondary | ICD-10-CM

## 2024-05-22 LAB — CBC WITH DIFFERENTIAL/PLATELET
Abs Immature Granulocytes: 0.02 K/uL (ref 0.00–0.07)
Basophils Absolute: 0 K/uL (ref 0.0–0.1)
Basophils Relative: 1 %
Eosinophils Absolute: 0.2 K/uL (ref 0.0–0.5)
Eosinophils Relative: 4 %
HCT: 27.6 % — ABNORMAL LOW (ref 36.0–46.0)
Hemoglobin: 8.5 g/dL — ABNORMAL LOW (ref 12.0–15.0)
Immature Granulocytes: 0 %
Lymphocytes Relative: 12 %
Lymphs Abs: 0.6 K/uL — ABNORMAL LOW (ref 0.7–4.0)
MCH: 26.8 pg (ref 26.0–34.0)
MCHC: 30.8 g/dL (ref 30.0–36.0)
MCV: 87.1 fL (ref 80.0–100.0)
Monocytes Absolute: 0.4 K/uL (ref 0.1–1.0)
Monocytes Relative: 7 %
Neutro Abs: 3.8 K/uL (ref 1.7–7.7)
Neutrophils Relative %: 76 %
Platelets: 280 K/uL (ref 150–400)
RBC: 3.17 MIL/uL — ABNORMAL LOW (ref 3.87–5.11)
RDW: 20.2 % — ABNORMAL HIGH (ref 11.5–15.5)
WBC: 5.1 K/uL (ref 4.0–10.5)
nRBC: 0 % (ref 0.0–0.2)

## 2024-05-22 LAB — SAMPLE TO BLOOD BANK

## 2024-05-22 NOTE — Assessment & Plan Note (Addendum)
 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  She was prescribed neoadjuvant chemotherapy followed by interval debulking surgery and completed adjuvant treatment by December 2022 Pathology: High grade serous, BRCA1/2 negative testing  She is on active surveillance She has no signs or symptoms to suggest cancer recurrence We discussed tumor marker monitoring; recent tumor marker was normal I have reviewed her recent CT imaging from November 2025 which showed no evidence of disease I will space out her appointment to once a year There is no indication for routine surveillance CT imaging unless she have symptoms

## 2024-05-22 NOTE — Progress Notes (Signed)
 [facility] OFFICE PROGRESS NOTE  Patient Care Team: Dyane Anthony RAMAN, FNP as PCP - General (Family Medicine) Lonni Slain, MD as Consulting Physician (Cardiology)  Assessment & Plan Iron  deficiency anemia due to chronic blood loss She had recent recurrent GI bleed with extensive GI investigation She had recent embolization Recent labs confirm iron  deficiency anemia She is symptomatic The most likely cause of her anemia is due to chronic blood loss We discussed some of the risks, benefits, and alternatives of intravenous iron  infusions. The patient is symptomatic from anemia and the iron  level is critically low. She tolerated oral iron  supplement poorly and desires to achieved higher levels of iron  faster for adequate hematopoesis. Some of the side-effects to be expected including risks of infusion reactions, phlebitis, headaches, nausea and fatigue.  The patient is willing to proceed. Patient education material was dispensed.  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  She was prescribed neoadjuvant chemotherapy followed by interval debulking surgery and completed adjuvant treatment by December 2022 Pathology: High grade serous, BRCA1/2 negative testing  She is on active surveillance She has no signs or symptoms to suggest cancer recurrence We discussed tumor marker monitoring; recent tumor marker was normal I have reviewed her recent CT imaging from November 2025 which showed no evidence of disease I will space out her appointment to once a year There is no indication for routine surveillance CT imaging unless she have symptoms  No orders of the  defined types were placed in this encounter.    Almarie Bedford, MD  INTERVAL HISTORY: she returns for surveillance follow-up for history of fallopian tube cancer, recent recurrent hospitalization for GI bleed and anemia She is feeling better Denies chest pain or shortness of breath but felt excessively fatigued No further bleeding I have reviewed blood work and CT imaging with the patient  PHYSICAL EXAMINATION: ECOG PERFORMANCE STATUS: 1 - Symptomatic but completely ambulatory  Vitals:   05/22/24 0941  BP: 133/60  Pulse: 71  Resp: 18  Temp: 99.3 F (37.4 C)  SpO2: 98%   Filed Weights   05/22/24 0941  Weight: 234 lb 3.2 oz (106.2 kg)    Relevant data reviewed during this visit included CBC, iron  studies, CMP, CT imaging from November 2025

## 2024-05-22 NOTE — Telephone Encounter (Signed)
 Auth Submission: NO AUTH NEEDED Site of care: [facility] Payer: [employer] MEDICARE Medication & CPT/J Code(s) submitted: Feraheme (ferumoxytol) [id number] Diagnosis Code: D50.0 Route of submission (phone, fax, portal):  Phone # Fax # Auth type: Buy/Bill HB Units/visits requested: 510mg  x 2 doses Reference number:  Approval from: 05/22/2024 to 07/21/24    Dagoberto Armour, CPhT [facility] Phone: [phone] 05/22/2024

## 2024-05-22 NOTE — Assessment & Plan Note (Addendum)
 She had recent recurrent GI bleed with extensive GI investigation She had recent embolization Recent labs confirm iron  deficiency anemia She is symptomatic The most likely cause of her anemia is due to chronic blood loss We discussed some of the risks, benefits, and alternatives of intravenous iron  infusions. The patient is symptomatic from anemia and the iron  level is critically low. She tolerated oral iron  supplement poorly and desires to achieved higher levels of iron  faster for adequate hematopoesis. Some of the side-effects to be expected including risks of infusion reactions, phlebitis, headaches, nausea and fatigue.  The patient is willing to proceed. Patient education material was dispensed.  Goal is to keep ferritin level greater than 50 and resolution of anemia I will order 2 doses of intravenous iron  Feraheme I recommend the patient to follow-up with her primary care doctor in about 3 months with repeat labs and iron  studies

## 2024-06-01 ENCOUNTER — Encounter (HOSPITAL_COMMUNITY)
Admission: RE | Admit: 2024-06-01 | Discharge: 2024-06-01 | Disposition: A | Discharge status: Home / Self Care | Source: Ambulatory Visit | Attending: Hematology and Oncology | Admitting: Hematology and Oncology

## 2024-06-01 VITALS — BP 113/52 | HR 64 | Temp 98.0°F | Resp 16

## 2024-06-01 DIAGNOSIS — D500 Iron deficiency anemia secondary to blood loss (chronic): Secondary | ICD-10-CM | POA: Insufficient documentation

## 2024-06-01 MED ORDER — ACETAMINOPHEN 325 MG PO TABS
650.0000 mg | ORAL_TABLET | Freq: Once | ORAL | Status: AC
  Administered 2024-06-01: 650 mg via ORAL

## 2024-06-01 MED ORDER — DIPHENHYDRAMINE HCL 25 MG PO CAPS: 25.0000 mg | ORAL_CAPSULE | Freq: Once | ORAL | Status: DC

## 2024-06-01 MED ORDER — SODIUM CHLORIDE 0.9 % IV SOLN
510.0000 mg | Freq: Once | INTRAVENOUS | Status: AC
  Administered 2024-06-01: 510 mg via INTRAVENOUS
  Filled 2024-06-01: qty 510

## 2024-06-01 MED ORDER — ACETAMINOPHEN 325 MG PO TABS
ORAL_TABLET | ORAL | Status: AC
  Filled 2024-06-01: qty 2

## 2024-06-08 ENCOUNTER — Inpatient Hospital Stay (HOSPITAL_COMMUNITY): Admission: RE | Admit: 2024-06-08 | Discharge: 2024-06-08 | Attending: Hematology and Oncology

## 2024-06-08 VITALS — BP 113/61 | HR 63 | Temp 98.0°F | Resp 16

## 2024-06-08 DIAGNOSIS — D500 Iron deficiency anemia secondary to blood loss (chronic): Secondary | ICD-10-CM

## 2024-06-08 MED ORDER — ACETAMINOPHEN 325 MG PO TABS
650.0000 mg | ORAL_TABLET | Freq: Once | ORAL | Status: AC
  Administered 2024-06-08: 650 mg via ORAL

## 2024-06-08 MED ORDER — SODIUM CHLORIDE 0.9 % IV SOLN
510.0000 mg | Freq: Once | INTRAVENOUS | Status: AC
  Administered 2024-06-08: 510 mg via INTRAVENOUS
  Filled 2024-06-08: qty 510

## 2024-06-08 MED ORDER — ACETAMINOPHEN 325 MG PO TABS
ORAL_TABLET | ORAL | Status: AC
  Filled 2024-06-08: qty 2

## 2024-06-08 MED ORDER — DIPHENHYDRAMINE HCL 25 MG PO CAPS: 25.0000 mg | ORAL_CAPSULE | Freq: Once | ORAL | Status: DC

## 2024-07-24 ENCOUNTER — Other Ambulatory Visit (HOSPITAL_COMMUNITY)
Admission: RE | Admit: 2024-07-24 | Discharge: 2024-07-24 | Disposition: A | Payer: Self-pay | Source: Home / Self Care | Attending: Medical Genetics | Admitting: Medical Genetics

## 2024-07-30 ENCOUNTER — Ambulatory Visit: Admitting: Pulmonary Disease

## 2024-11-20 ENCOUNTER — Other Ambulatory Visit

## 2024-11-27 ENCOUNTER — Ambulatory Visit: Admitting: Hematology and Oncology

## 2025-05-22 ENCOUNTER — Inpatient Hospital Stay

## 2025-05-30 ENCOUNTER — Inpatient Hospital Stay: Admitting: Hematology and Oncology
